# Patient Record
Sex: Female | Born: 1965
Health system: Southern US, Community
[De-identification: ages and names within clinical notes are randomized; demographics above are authoritative.]

## PROBLEM LIST (undated history)

## (undated) DIAGNOSIS — R0600 Dyspnea, unspecified: Secondary | ICD-10-CM

## (undated) DIAGNOSIS — Z720 Tobacco use: Secondary | ICD-10-CM

## (undated) DIAGNOSIS — I493 Ventricular premature depolarization: Secondary | ICD-10-CM

## (undated) DIAGNOSIS — J449 Chronic obstructive pulmonary disease, unspecified: Secondary | ICD-10-CM

## (undated) DIAGNOSIS — I2510 Atherosclerotic heart disease of native coronary artery without angina pectoris: Secondary | ICD-10-CM

## (undated) DIAGNOSIS — K219 Gastro-esophageal reflux disease without esophagitis: Secondary | ICD-10-CM

## (undated) DIAGNOSIS — Z972 Presence of dental prosthetic device (complete) (partial): Secondary | ICD-10-CM

## (undated) DIAGNOSIS — Z8719 Personal history of other diseases of the digestive system: Secondary | ICD-10-CM

## (undated) DIAGNOSIS — I10 Essential (primary) hypertension: Secondary | ICD-10-CM

## (undated) DIAGNOSIS — R002 Palpitations: Secondary | ICD-10-CM

## (undated) DIAGNOSIS — Z9889 Other specified postprocedural states: Secondary | ICD-10-CM

## (undated) DIAGNOSIS — U071 COVID-19: Secondary | ICD-10-CM

## (undated) DIAGNOSIS — F419 Anxiety disorder, unspecified: Secondary | ICD-10-CM

## (undated) DIAGNOSIS — M1990 Unspecified osteoarthritis, unspecified site: Secondary | ICD-10-CM

## (undated) DIAGNOSIS — R112 Nausea with vomiting, unspecified: Secondary | ICD-10-CM

## (undated) HISTORY — DX: Tobacco use: Z72.0

## (undated) HISTORY — DX: Palpitations: R00.2

## (undated) HISTORY — DX: Ventricular premature depolarization: I49.3

## (undated) HISTORY — DX: Anxiety disorder, unspecified: F41.9

## (undated) HISTORY — PX: EYE SURGERY: SHX253

## (undated) HISTORY — DX: Essential (primary) hypertension: I10

## (undated) HISTORY — DX: Atherosclerotic heart disease of native coronary artery without angina pectoris: I25.10

## (undated) HISTORY — DX: Chronic obstructive pulmonary disease, unspecified: J44.9

## (undated) HISTORY — PX: CHOLECYSTECTOMY: SHX55

---

## 1993-03-04 HISTORY — PX: VAGINAL HYSTERECTOMY: SUR661

## 1998-03-04 DIAGNOSIS — I10 Essential (primary) hypertension: Secondary | ICD-10-CM | POA: Insufficient documentation

## 2005-04-24 ENCOUNTER — Emergency Department: Payer: Self-pay | Admitting: Unknown Physician Specialty

## 2011-01-29 DIAGNOSIS — I493 Ventricular premature depolarization: Secondary | ICD-10-CM | POA: Insufficient documentation

## 2011-08-06 DIAGNOSIS — R12 Heartburn: Secondary | ICD-10-CM | POA: Insufficient documentation

## 2011-09-13 DIAGNOSIS — Z86010 Personal history of colonic polyps: Secondary | ICD-10-CM | POA: Insufficient documentation

## 2011-09-13 DIAGNOSIS — Z860101 Personal history of adenomatous and serrated colon polyps: Secondary | ICD-10-CM | POA: Insufficient documentation

## 2011-10-14 LAB — HM COLONOSCOPY

## 2011-12-05 ENCOUNTER — Emergency Department: Payer: Self-pay | Admitting: Emergency Medicine

## 2011-12-05 DIAGNOSIS — R6889 Other general symptoms and signs: Secondary | ICD-10-CM | POA: Diagnosis not present

## 2011-12-05 LAB — COMPREHENSIVE METABOLIC PANEL
Albumin: 3.5 g/dL (ref 3.4–5.0)
Alkaline Phosphatase: 114 U/L (ref 50–136)
Anion Gap: 8 (ref 7–16)
Bilirubin,Total: 0.5 mg/dL (ref 0.2–1.0)
Co2: 24 mmol/L (ref 21–32)
Creatinine: 0.76 mg/dL (ref 0.60–1.30)
EGFR (Non-African Amer.): >60
Glucose: 110 mg/dL — ABNORMAL HIGH (ref 65–99)
Osmolality: 279 (ref 275–301)
Potassium: 4 mmol/L (ref 3.5–5.1)
Sodium: 139 mmol/L (ref 136–145)

## 2011-12-05 LAB — CBC
HGB: 14.5 g/dL (ref 12.0–16.0)
MCH: 30.1 pg (ref 26.0–34.0)
MCHC: 35.1 g/dL (ref 32.0–36.0)
MCV: 86 fL (ref 80–100)
Platelet: 202 10*3/uL (ref 150–440)
RBC: 4.81 10*6/uL (ref 3.80–5.20)

## 2012-01-13 DIAGNOSIS — F332 Major depressive disorder, recurrent severe without psychotic features: Secondary | ICD-10-CM | POA: Diagnosis not present

## 2012-01-13 DIAGNOSIS — F4001 Agoraphobia with panic disorder: Secondary | ICD-10-CM | POA: Diagnosis not present

## 2012-01-14 DIAGNOSIS — L408 Other psoriasis: Secondary | ICD-10-CM | POA: Diagnosis not present

## 2012-01-23 DIAGNOSIS — F4001 Agoraphobia with panic disorder: Secondary | ICD-10-CM | POA: Diagnosis not present

## 2012-01-23 DIAGNOSIS — F332 Major depressive disorder, recurrent severe without psychotic features: Secondary | ICD-10-CM | POA: Diagnosis not present

## 2012-02-11 DIAGNOSIS — IMO0002 Reserved for concepts with insufficient information to code with codable children: Secondary | ICD-10-CM | POA: Insufficient documentation

## 2012-02-11 DIAGNOSIS — M329 Systemic lupus erythematosus, unspecified: Secondary | ICD-10-CM | POA: Insufficient documentation

## 2012-02-11 DIAGNOSIS — F410 Panic disorder [episodic paroxysmal anxiety] without agoraphobia: Secondary | ICD-10-CM

## 2012-02-11 DIAGNOSIS — M797 Fibromyalgia: Secondary | ICD-10-CM | POA: Insufficient documentation

## 2012-02-11 DIAGNOSIS — H119 Unspecified disorder of conjunctiva: Secondary | ICD-10-CM | POA: Diagnosis not present

## 2012-02-11 HISTORY — DX: Panic disorder (episodic paroxysmal anxiety): F41.0

## 2012-02-21 DIAGNOSIS — H11139 Conjunctival pigmentations, unspecified eye: Secondary | ICD-10-CM | POA: Diagnosis not present

## 2012-02-21 DIAGNOSIS — H1189 Other specified disorders of conjunctiva: Secondary | ICD-10-CM | POA: Diagnosis not present

## 2012-02-21 DIAGNOSIS — L409 Psoriasis, unspecified: Secondary | ICD-10-CM | POA: Insufficient documentation

## 2012-02-21 DIAGNOSIS — C6930 Malignant neoplasm of unspecified choroid: Secondary | ICD-10-CM | POA: Diagnosis not present

## 2012-02-21 DIAGNOSIS — H169 Unspecified keratitis: Secondary | ICD-10-CM | POA: Diagnosis not present

## 2012-03-09 DIAGNOSIS — M2550 Pain in unspecified joint: Secondary | ICD-10-CM | POA: Insufficient documentation

## 2012-03-09 DIAGNOSIS — M25549 Pain in joints of unspecified hand: Secondary | ICD-10-CM | POA: Diagnosis not present

## 2012-03-09 DIAGNOSIS — M7750 Other enthesopathy of unspecified foot: Secondary | ICD-10-CM | POA: Diagnosis not present

## 2012-03-09 DIAGNOSIS — M899 Disorder of bone, unspecified: Secondary | ICD-10-CM | POA: Diagnosis not present

## 2012-03-09 DIAGNOSIS — M25579 Pain in unspecified ankle and joints of unspecified foot: Secondary | ICD-10-CM | POA: Diagnosis not present

## 2012-03-10 DIAGNOSIS — R109 Unspecified abdominal pain: Secondary | ICD-10-CM | POA: Diagnosis not present

## 2012-03-10 DIAGNOSIS — K219 Gastro-esophageal reflux disease without esophagitis: Secondary | ICD-10-CM | POA: Diagnosis not present

## 2012-03-10 DIAGNOSIS — R6889 Other general symptoms and signs: Secondary | ICD-10-CM | POA: Diagnosis not present

## 2012-03-10 DIAGNOSIS — R1012 Left upper quadrant pain: Secondary | ICD-10-CM | POA: Diagnosis not present

## 2012-03-10 DIAGNOSIS — K319 Disease of stomach and duodenum, unspecified: Secondary | ICD-10-CM | POA: Diagnosis not present

## 2012-03-11 DIAGNOSIS — R1084 Generalized abdominal pain: Secondary | ICD-10-CM | POA: Insufficient documentation

## 2012-03-12 DIAGNOSIS — F332 Major depressive disorder, recurrent severe without psychotic features: Secondary | ICD-10-CM | POA: Diagnosis not present

## 2012-03-12 DIAGNOSIS — F4001 Agoraphobia with panic disorder: Secondary | ICD-10-CM | POA: Diagnosis not present

## 2012-03-26 DIAGNOSIS — L408 Other psoriasis: Secondary | ICD-10-CM | POA: Diagnosis not present

## 2012-03-26 DIAGNOSIS — L930 Discoid lupus erythematosus: Secondary | ICD-10-CM | POA: Diagnosis not present

## 2012-03-30 DIAGNOSIS — M329 Systemic lupus erythematosus, unspecified: Secondary | ICD-10-CM | POA: Diagnosis not present

## 2012-03-30 DIAGNOSIS — R21 Rash and other nonspecific skin eruption: Secondary | ICD-10-CM | POA: Diagnosis not present

## 2012-03-30 DIAGNOSIS — R109 Unspecified abdominal pain: Secondary | ICD-10-CM | POA: Diagnosis not present

## 2012-04-14 DIAGNOSIS — Z1231 Encounter for screening mammogram for malignant neoplasm of breast: Secondary | ICD-10-CM | POA: Diagnosis not present

## 2012-04-14 LAB — HM MAMMOGRAPHY

## 2012-04-21 DIAGNOSIS — F4001 Agoraphobia with panic disorder: Secondary | ICD-10-CM | POA: Diagnosis not present

## 2012-04-21 DIAGNOSIS — F332 Major depressive disorder, recurrent severe without psychotic features: Secondary | ICD-10-CM | POA: Diagnosis not present

## 2012-04-30 DIAGNOSIS — Z79899 Other long term (current) drug therapy: Secondary | ICD-10-CM | POA: Diagnosis not present

## 2012-04-30 DIAGNOSIS — L408 Other psoriasis: Secondary | ICD-10-CM | POA: Diagnosis not present

## 2012-05-07 DIAGNOSIS — Z79899 Other long term (current) drug therapy: Secondary | ICD-10-CM | POA: Diagnosis not present

## 2012-05-07 DIAGNOSIS — L408 Other psoriasis: Secondary | ICD-10-CM | POA: Diagnosis not present

## 2012-05-12 DIAGNOSIS — F4001 Agoraphobia with panic disorder: Secondary | ICD-10-CM | POA: Diagnosis not present

## 2012-05-12 DIAGNOSIS — F332 Major depressive disorder, recurrent severe without psychotic features: Secondary | ICD-10-CM | POA: Diagnosis not present

## 2012-05-28 DIAGNOSIS — L708 Other acne: Secondary | ICD-10-CM | POA: Diagnosis not present

## 2012-05-28 DIAGNOSIS — L723 Sebaceous cyst: Secondary | ICD-10-CM | POA: Diagnosis not present

## 2012-05-28 DIAGNOSIS — L408 Other psoriasis: Secondary | ICD-10-CM | POA: Diagnosis not present

## 2012-05-30 DIAGNOSIS — L0201 Cutaneous abscess of face: Secondary | ICD-10-CM | POA: Insufficient documentation

## 2012-05-30 DIAGNOSIS — L930 Discoid lupus erythematosus: Secondary | ICD-10-CM | POA: Insufficient documentation

## 2012-06-01 DIAGNOSIS — B029 Zoster without complications: Secondary | ICD-10-CM | POA: Diagnosis not present

## 2012-06-01 DIAGNOSIS — R209 Unspecified disturbances of skin sensation: Secondary | ICD-10-CM | POA: Diagnosis not present

## 2012-06-10 DIAGNOSIS — M2550 Pain in unspecified joint: Secondary | ICD-10-CM | POA: Diagnosis not present

## 2012-06-10 DIAGNOSIS — R1084 Generalized abdominal pain: Secondary | ICD-10-CM | POA: Diagnosis not present

## 2012-06-10 DIAGNOSIS — M329 Systemic lupus erythematosus, unspecified: Secondary | ICD-10-CM | POA: Diagnosis not present

## 2012-06-10 DIAGNOSIS — I10 Essential (primary) hypertension: Secondary | ICD-10-CM | POA: Diagnosis not present

## 2012-06-15 DIAGNOSIS — F331 Major depressive disorder, recurrent, moderate: Secondary | ICD-10-CM | POA: Diagnosis not present

## 2012-06-15 DIAGNOSIS — F4001 Agoraphobia with panic disorder: Secondary | ICD-10-CM | POA: Diagnosis not present

## 2012-07-13 DIAGNOSIS — F4001 Agoraphobia with panic disorder: Secondary | ICD-10-CM | POA: Diagnosis not present

## 2012-07-13 DIAGNOSIS — F331 Major depressive disorder, recurrent, moderate: Secondary | ICD-10-CM | POA: Diagnosis not present

## 2012-07-22 DIAGNOSIS — J019 Acute sinusitis, unspecified: Secondary | ICD-10-CM | POA: Diagnosis not present

## 2012-07-22 DIAGNOSIS — H65119 Acute and subacute allergic otitis media (mucoid) (sanguinous) (serous), unspecified ear: Secondary | ICD-10-CM | POA: Diagnosis not present

## 2012-07-23 DIAGNOSIS — F411 Generalized anxiety disorder: Secondary | ICD-10-CM | POA: Diagnosis not present

## 2012-07-23 DIAGNOSIS — F17200 Nicotine dependence, unspecified, uncomplicated: Secondary | ICD-10-CM | POA: Diagnosis not present

## 2012-07-23 DIAGNOSIS — R0602 Shortness of breath: Secondary | ICD-10-CM | POA: Diagnosis not present

## 2012-07-23 DIAGNOSIS — B338 Other specified viral diseases: Secondary | ICD-10-CM | POA: Diagnosis not present

## 2012-07-23 DIAGNOSIS — R0902 Hypoxemia: Secondary | ICD-10-CM | POA: Diagnosis not present

## 2012-07-23 DIAGNOSIS — J209 Acute bronchitis, unspecified: Secondary | ICD-10-CM | POA: Diagnosis not present

## 2012-07-23 DIAGNOSIS — J449 Chronic obstructive pulmonary disease, unspecified: Secondary | ICD-10-CM | POA: Diagnosis not present

## 2012-07-23 DIAGNOSIS — I509 Heart failure, unspecified: Secondary | ICD-10-CM | POA: Diagnosis not present

## 2012-07-23 DIAGNOSIS — M329 Systemic lupus erythematosus, unspecified: Secondary | ICD-10-CM | POA: Diagnosis not present

## 2012-07-23 DIAGNOSIS — J441 Chronic obstructive pulmonary disease with (acute) exacerbation: Secondary | ICD-10-CM | POA: Diagnosis not present

## 2012-07-23 DIAGNOSIS — R9431 Abnormal electrocardiogram [ECG] [EKG]: Secondary | ICD-10-CM | POA: Diagnosis not present

## 2012-07-23 LAB — CK TOTAL AND CKMB (NOT AT ARMC): CK, Total: 44 U/L (ref 21–215)

## 2012-07-23 LAB — BASIC METABOLIC PANEL
Anion Gap: 4 — ABNORMAL LOW (ref 7–16)
BUN: 12 mg/dL (ref 7–18)
Calcium, Total: 9.5 mg/dL (ref 8.5–10.1)
Co2: 31 mmol/L (ref 21–32)
EGFR (Non-African Amer.): >60
Glucose: 89 mg/dL (ref 65–99)

## 2012-07-23 LAB — CBC WITH DIFFERENTIAL/PLATELET
Eosinophil #: 0.3 10*3/uL (ref 0.0–0.7)
Eosinophil %: 2.2 %
HCT: 44.4 % (ref 35.0–47.0)
HGB: 15 g/dL (ref 12.0–16.0)
Lymphocyte #: 3.6 10*3/uL (ref 1.0–3.6)
Lymphocyte %: 29.7 %
MCHC: 33.7 g/dL (ref 32.0–36.0)
MCV: 85 fL (ref 80–100)
Monocyte #: 0.9 x10 3/mm (ref 0.2–0.9)
Monocyte %: 7.5 %
Neutrophil #: 7.2 10*3/uL — ABNORMAL HIGH (ref 1.4–6.5)
RDW: 14.1 % (ref 11.5–14.5)

## 2012-07-23 LAB — URINALYSIS, COMPLETE
Bilirubin,UR: NEGATIVE
Blood: NEGATIVE
Glucose,UR: NEGATIVE mg/dL (ref 0–75)
Leukocyte Esterase: NEGATIVE
Nitrite: NEGATIVE
Ph: 6 (ref 4.5–8.0)
WBC UR: 1 /HPF (ref 0–5)

## 2012-07-23 LAB — TROPONIN I: Troponin-I: <0.02 ng/mL

## 2012-07-24 DIAGNOSIS — F411 Generalized anxiety disorder: Secondary | ICD-10-CM | POA: Diagnosis not present

## 2012-07-24 DIAGNOSIS — J209 Acute bronchitis, unspecified: Secondary | ICD-10-CM | POA: Diagnosis not present

## 2012-07-24 DIAGNOSIS — J441 Chronic obstructive pulmonary disease with (acute) exacerbation: Secondary | ICD-10-CM | POA: Diagnosis not present

## 2012-07-24 DIAGNOSIS — K219 Gastro-esophageal reflux disease without esophagitis: Secondary | ICD-10-CM | POA: Diagnosis not present

## 2012-07-24 DIAGNOSIS — M329 Systemic lupus erythematosus, unspecified: Secondary | ICD-10-CM | POA: Diagnosis not present

## 2012-07-24 DIAGNOSIS — R0602 Shortness of breath: Secondary | ICD-10-CM | POA: Diagnosis not present

## 2012-07-24 LAB — TROPONIN I
Troponin-I: <0.02 ng/mL
Troponin-I: <0.02 ng/mL

## 2012-07-24 LAB — CK-MB: CK-MB: 1.2 ng/mL (ref 0.5–3.6)

## 2012-07-25 ENCOUNTER — Inpatient Hospital Stay: Payer: Self-pay | Admitting: Internal Medicine

## 2012-07-25 DIAGNOSIS — F410 Panic disorder [episodic paroxysmal anxiety] without agoraphobia: Secondary | ICD-10-CM | POA: Diagnosis not present

## 2012-07-25 DIAGNOSIS — IMO0001 Reserved for inherently not codable concepts without codable children: Secondary | ICD-10-CM | POA: Diagnosis present

## 2012-07-25 DIAGNOSIS — F17200 Nicotine dependence, unspecified, uncomplicated: Secondary | ICD-10-CM | POA: Diagnosis present

## 2012-07-25 DIAGNOSIS — Z6828 Body mass index (BMI) 28.0-28.9, adult: Secondary | ICD-10-CM | POA: Diagnosis not present

## 2012-07-25 DIAGNOSIS — R0602 Shortness of breath: Secondary | ICD-10-CM | POA: Diagnosis not present

## 2012-07-25 DIAGNOSIS — K219 Gastro-esophageal reflux disease without esophagitis: Secondary | ICD-10-CM | POA: Diagnosis present

## 2012-07-25 DIAGNOSIS — E669 Obesity, unspecified: Secondary | ICD-10-CM | POA: Diagnosis present

## 2012-07-25 DIAGNOSIS — J440 Chronic obstructive pulmonary disease with acute lower respiratory infection: Secondary | ICD-10-CM | POA: Diagnosis present

## 2012-07-25 DIAGNOSIS — R9431 Abnormal electrocardiogram [ECG] [EKG]: Secondary | ICD-10-CM | POA: Diagnosis present

## 2012-07-25 DIAGNOSIS — J209 Acute bronchitis, unspecified: Secondary | ICD-10-CM | POA: Diagnosis not present

## 2012-07-25 DIAGNOSIS — M329 Systemic lupus erythematosus, unspecified: Secondary | ICD-10-CM | POA: Diagnosis present

## 2012-07-25 DIAGNOSIS — J441 Chronic obstructive pulmonary disease with (acute) exacerbation: Secondary | ICD-10-CM | POA: Diagnosis not present

## 2012-07-25 DIAGNOSIS — F411 Generalized anxiety disorder: Secondary | ICD-10-CM | POA: Diagnosis not present

## 2012-07-25 LAB — URINE CULTURE

## 2012-07-29 LAB — CULTURE, BLOOD (SINGLE)

## 2012-07-30 DIAGNOSIS — R0602 Shortness of breath: Secondary | ICD-10-CM | POA: Diagnosis not present

## 2012-08-06 DIAGNOSIS — R0602 Shortness of breath: Secondary | ICD-10-CM | POA: Diagnosis not present

## 2012-08-10 DIAGNOSIS — Z23 Encounter for immunization: Secondary | ICD-10-CM | POA: Diagnosis not present

## 2012-08-10 DIAGNOSIS — R0602 Shortness of breath: Secondary | ICD-10-CM | POA: Diagnosis not present

## 2012-08-10 DIAGNOSIS — J449 Chronic obstructive pulmonary disease, unspecified: Secondary | ICD-10-CM | POA: Diagnosis not present

## 2012-08-10 DIAGNOSIS — M329 Systemic lupus erythematosus, unspecified: Secondary | ICD-10-CM | POA: Diagnosis not present

## 2012-08-12 DIAGNOSIS — M545 Low back pain: Secondary | ICD-10-CM | POA: Diagnosis not present

## 2012-08-12 DIAGNOSIS — H65119 Acute and subacute allergic otitis media (mucoid) (sanguinous) (serous), unspecified ear: Secondary | ICD-10-CM | POA: Diagnosis not present

## 2012-08-13 DIAGNOSIS — R350 Frequency of micturition: Secondary | ICD-10-CM | POA: Diagnosis not present

## 2012-08-14 ENCOUNTER — Emergency Department: Payer: Self-pay

## 2012-08-14 DIAGNOSIS — Z888 Allergy status to other drugs, medicaments and biological substances status: Secondary | ICD-10-CM | POA: Diagnosis not present

## 2012-08-14 DIAGNOSIS — I10 Essential (primary) hypertension: Secondary | ICD-10-CM | POA: Diagnosis not present

## 2012-08-14 DIAGNOSIS — S336XXA Sprain of sacroiliac joint, initial encounter: Secondary | ICD-10-CM | POA: Diagnosis not present

## 2012-08-14 DIAGNOSIS — Z87442 Personal history of urinary calculi: Secondary | ICD-10-CM | POA: Diagnosis not present

## 2012-08-14 DIAGNOSIS — IMO0001 Reserved for inherently not codable concepts without codable children: Secondary | ICD-10-CM | POA: Diagnosis not present

## 2012-08-14 DIAGNOSIS — Z9079 Acquired absence of other genital organ(s): Secondary | ICD-10-CM | POA: Diagnosis not present

## 2012-08-14 DIAGNOSIS — F17200 Nicotine dependence, unspecified, uncomplicated: Secondary | ICD-10-CM | POA: Diagnosis not present

## 2012-08-14 LAB — URINALYSIS, COMPLETE
Bacteria: NONE SEEN
Glucose,UR: NEGATIVE mg/dL (ref 0–75)
Ketone: NEGATIVE
Leukocyte Esterase: NEGATIVE
Nitrite: NEGATIVE
Ph: 5 (ref 4.5–8.0)
Protein: NEGATIVE
RBC,UR: NONE SEEN /HPF (ref 0–5)
Specific Gravity: 1.011 (ref 1.003–1.030)
Squamous Epithelial: <1
WBC UR: 1 /HPF (ref 0–5)

## 2012-08-14 LAB — CBC
HCT: 41.7 % (ref 35.0–47.0)
HGB: 14.5 g/dL (ref 12.0–16.0)
MCHC: 34.7 g/dL (ref 32.0–36.0)
Platelet: 214 10*3/uL (ref 150–440)
RBC: 4.85 10*6/uL (ref 3.80–5.20)
RDW: 14.4 % (ref 11.5–14.5)

## 2012-08-14 LAB — COMPREHENSIVE METABOLIC PANEL
Alkaline Phosphatase: 89 U/L (ref 50–136)
Anion Gap: 6 — ABNORMAL LOW (ref 7–16)
Bilirubin,Total: 0.6 mg/dL (ref 0.2–1.0)
Calcium, Total: 9.4 mg/dL (ref 8.5–10.1)
Chloride: 104 mmol/L (ref 98–107)
EGFR (African American): >60
Glucose: 81 mg/dL (ref 65–99)
Osmolality: 278 (ref 275–301)
SGOT(AST): 15 U/L (ref 15–37)
Sodium: 140 mmol/L (ref 136–145)
Total Protein: 7.1 g/dL (ref 6.4–8.2)

## 2012-08-28 DIAGNOSIS — M549 Dorsalgia, unspecified: Secondary | ICD-10-CM | POA: Diagnosis not present

## 2012-08-28 DIAGNOSIS — J449 Chronic obstructive pulmonary disease, unspecified: Secondary | ICD-10-CM | POA: Diagnosis not present

## 2012-08-28 DIAGNOSIS — R1084 Generalized abdominal pain: Secondary | ICD-10-CM | POA: Diagnosis not present

## 2012-08-28 DIAGNOSIS — M999 Biomechanical lesion, unspecified: Secondary | ICD-10-CM | POA: Diagnosis not present

## 2012-09-02 DIAGNOSIS — M999 Biomechanical lesion, unspecified: Secondary | ICD-10-CM | POA: Diagnosis not present

## 2012-09-07 DIAGNOSIS — F4001 Agoraphobia with panic disorder: Secondary | ICD-10-CM | POA: Diagnosis not present

## 2012-09-07 DIAGNOSIS — F332 Major depressive disorder, recurrent severe without psychotic features: Secondary | ICD-10-CM | POA: Diagnosis not present

## 2012-09-16 DIAGNOSIS — M999 Biomechanical lesion, unspecified: Secondary | ICD-10-CM | POA: Diagnosis not present

## 2012-09-18 DIAGNOSIS — M999 Biomechanical lesion, unspecified: Secondary | ICD-10-CM | POA: Diagnosis not present

## 2012-09-23 DIAGNOSIS — M999 Biomechanical lesion, unspecified: Secondary | ICD-10-CM | POA: Diagnosis not present

## 2012-09-25 DIAGNOSIS — M999 Biomechanical lesion, unspecified: Secondary | ICD-10-CM | POA: Diagnosis not present

## 2012-10-13 DIAGNOSIS — M549 Dorsalgia, unspecified: Secondary | ICD-10-CM | POA: Diagnosis not present

## 2012-10-13 DIAGNOSIS — I10 Essential (primary) hypertension: Secondary | ICD-10-CM | POA: Diagnosis not present

## 2012-10-13 DIAGNOSIS — J449 Chronic obstructive pulmonary disease, unspecified: Secondary | ICD-10-CM | POA: Diagnosis not present

## 2012-10-22 ENCOUNTER — Ambulatory Visit: Payer: Self-pay | Admitting: Family Medicine

## 2012-10-22 DIAGNOSIS — R509 Fever, unspecified: Secondary | ICD-10-CM | POA: Diagnosis not present

## 2012-10-22 DIAGNOSIS — R059 Cough, unspecified: Secondary | ICD-10-CM | POA: Diagnosis not present

## 2012-10-22 DIAGNOSIS — R05 Cough: Secondary | ICD-10-CM | POA: Diagnosis not present

## 2012-10-29 ENCOUNTER — Emergency Department: Payer: Self-pay | Admitting: Emergency Medicine

## 2012-10-29 DIAGNOSIS — J441 Chronic obstructive pulmonary disease with (acute) exacerbation: Secondary | ICD-10-CM | POA: Diagnosis not present

## 2012-10-29 DIAGNOSIS — J440 Chronic obstructive pulmonary disease with acute lower respiratory infection: Secondary | ICD-10-CM | POA: Diagnosis not present

## 2012-10-29 DIAGNOSIS — Z888 Allergy status to other drugs, medicaments and biological substances status: Secondary | ICD-10-CM | POA: Diagnosis not present

## 2012-10-29 DIAGNOSIS — Z79899 Other long term (current) drug therapy: Secondary | ICD-10-CM | POA: Diagnosis not present

## 2012-10-29 DIAGNOSIS — R0602 Shortness of breath: Secondary | ICD-10-CM | POA: Diagnosis not present

## 2012-10-29 DIAGNOSIS — R062 Wheezing: Secondary | ICD-10-CM | POA: Diagnosis not present

## 2012-10-29 DIAGNOSIS — J209 Acute bronchitis, unspecified: Secondary | ICD-10-CM | POA: Diagnosis not present

## 2012-10-29 LAB — BASIC METABOLIC PANEL
Anion Gap: 3 — ABNORMAL LOW (ref 7–16)
BUN: 12 mg/dL (ref 7–18)
Calcium, Total: 9.4 mg/dL (ref 8.5–10.1)
Chloride: 106 mmol/L (ref 98–107)
Co2: 31 mmol/L (ref 21–32)
Creatinine: 0.82 mg/dL (ref 0.60–1.30)
EGFR (African American): >60
Glucose: 84 mg/dL (ref 65–99)
Sodium: 140 mmol/L (ref 136–145)

## 2012-10-29 LAB — CBC
HCT: 42.2 % (ref 35.0–47.0)
MCH: 29 pg (ref 26.0–34.0)
MCHC: 34.3 g/dL (ref 32.0–36.0)
RDW: 13.9 % (ref 11.5–14.5)

## 2012-10-29 LAB — TROPONIN I: Troponin-I: <0.02 ng/mL

## 2012-10-30 DIAGNOSIS — F17200 Nicotine dependence, unspecified, uncomplicated: Secondary | ICD-10-CM | POA: Diagnosis not present

## 2012-10-30 DIAGNOSIS — J441 Chronic obstructive pulmonary disease with (acute) exacerbation: Secondary | ICD-10-CM | POA: Diagnosis not present

## 2012-11-09 DIAGNOSIS — F4001 Agoraphobia with panic disorder: Secondary | ICD-10-CM | POA: Diagnosis not present

## 2012-11-09 DIAGNOSIS — F331 Major depressive disorder, recurrent, moderate: Secondary | ICD-10-CM | POA: Diagnosis not present

## 2012-11-09 DIAGNOSIS — F332 Major depressive disorder, recurrent severe without psychotic features: Secondary | ICD-10-CM | POA: Diagnosis not present

## 2012-11-11 DIAGNOSIS — M999 Biomechanical lesion, unspecified: Secondary | ICD-10-CM | POA: Diagnosis not present

## 2012-11-18 DIAGNOSIS — M999 Biomechanical lesion, unspecified: Secondary | ICD-10-CM | POA: Diagnosis not present

## 2012-11-20 DIAGNOSIS — J449 Chronic obstructive pulmonary disease, unspecified: Secondary | ICD-10-CM | POA: Diagnosis not present

## 2012-11-20 DIAGNOSIS — J441 Chronic obstructive pulmonary disease with (acute) exacerbation: Secondary | ICD-10-CM | POA: Diagnosis not present

## 2013-01-14 DIAGNOSIS — J449 Chronic obstructive pulmonary disease, unspecified: Secondary | ICD-10-CM | POA: Diagnosis not present

## 2013-01-14 DIAGNOSIS — J441 Chronic obstructive pulmonary disease with (acute) exacerbation: Secondary | ICD-10-CM | POA: Diagnosis not present

## 2013-02-01 DIAGNOSIS — F4001 Agoraphobia with panic disorder: Secondary | ICD-10-CM | POA: Diagnosis not present

## 2013-02-01 DIAGNOSIS — F331 Major depressive disorder, recurrent, moderate: Secondary | ICD-10-CM | POA: Diagnosis not present

## 2013-03-10 ENCOUNTER — Emergency Department: Payer: Self-pay | Admitting: Emergency Medicine

## 2013-03-10 DIAGNOSIS — I498 Other specified cardiac arrhythmias: Secondary | ICD-10-CM | POA: Diagnosis not present

## 2013-03-10 DIAGNOSIS — Z79899 Other long term (current) drug therapy: Secondary | ICD-10-CM | POA: Diagnosis not present

## 2013-03-10 DIAGNOSIS — I10 Essential (primary) hypertension: Secondary | ICD-10-CM | POA: Diagnosis not present

## 2013-03-10 DIAGNOSIS — Z881 Allergy status to other antibiotic agents status: Secondary | ICD-10-CM | POA: Diagnosis not present

## 2013-03-10 DIAGNOSIS — K219 Gastro-esophageal reflux disease without esophagitis: Secondary | ICD-10-CM | POA: Diagnosis not present

## 2013-03-10 DIAGNOSIS — F17200 Nicotine dependence, unspecified, uncomplicated: Secondary | ICD-10-CM | POA: Diagnosis not present

## 2013-03-10 DIAGNOSIS — M329 Systemic lupus erythematosus, unspecified: Secondary | ICD-10-CM | POA: Diagnosis not present

## 2013-03-10 DIAGNOSIS — J209 Acute bronchitis, unspecified: Secondary | ICD-10-CM | POA: Diagnosis not present

## 2013-03-10 DIAGNOSIS — Z90710 Acquired absence of both cervix and uterus: Secondary | ICD-10-CM | POA: Diagnosis not present

## 2013-03-10 DIAGNOSIS — J988 Other specified respiratory disorders: Secondary | ICD-10-CM | POA: Diagnosis not present

## 2013-03-10 DIAGNOSIS — L408 Other psoriasis: Secondary | ICD-10-CM | POA: Diagnosis not present

## 2013-03-10 DIAGNOSIS — J441 Chronic obstructive pulmonary disease with (acute) exacerbation: Secondary | ICD-10-CM | POA: Diagnosis not present

## 2013-03-10 DIAGNOSIS — R0602 Shortness of breath: Secondary | ICD-10-CM | POA: Diagnosis not present

## 2013-03-10 DIAGNOSIS — R079 Chest pain, unspecified: Secondary | ICD-10-CM | POA: Diagnosis not present

## 2013-03-10 LAB — CK TOTAL AND CKMB (NOT AT ARMC)
CK, Total: 40 U/L (ref 21–215)
CK-MB: <0.5 ng/mL — ABNORMAL LOW (ref 0.5–3.6)

## 2013-03-10 LAB — CBC WITH DIFFERENTIAL/PLATELET
BASOS PCT: 0.3 %
Basophil #: 0 10*3/uL (ref 0.0–0.1)
Eosinophil #: 0 10*3/uL (ref 0.0–0.7)
Eosinophil %: 0.2 %
HCT: 44.2 % (ref 35.0–47.0)
HGB: 15 g/dL (ref 12.0–16.0)
LYMPHS ABS: 0.8 10*3/uL — AB (ref 1.0–3.6)
Lymphocyte %: 6.4 %
MCH: 29 pg (ref 26.0–34.0)
MCHC: 33.9 g/dL (ref 32.0–36.0)
MCV: 86 fL (ref 80–100)
MONOS PCT: 4 %
Monocyte #: 0.5 x10 3/mm (ref 0.2–0.9)
NEUTROS ABS: 10.4 10*3/uL — AB (ref 1.4–6.5)
Neutrophil %: 89.1 %
Platelet: 212 10*3/uL (ref 150–440)
RBC: 5.17 10*6/uL (ref 3.80–5.20)
RDW: 14.2 % (ref 11.5–14.5)
WBC: 11.7 10*3/uL — ABNORMAL HIGH (ref 3.6–11.0)

## 2013-03-10 LAB — COMPREHENSIVE METABOLIC PANEL
ALBUMIN: 3.6 g/dL (ref 3.4–5.0)
Alkaline Phosphatase: 114 U/L
Anion Gap: 5 — ABNORMAL LOW (ref 7–16)
BILIRUBIN TOTAL: 0.5 mg/dL (ref 0.2–1.0)
BUN: 12 mg/dL (ref 7–18)
CALCIUM: 9.6 mg/dL (ref 8.5–10.1)
CHLORIDE: 101 mmol/L (ref 98–107)
CREATININE: 0.75 mg/dL (ref 0.60–1.30)
Co2: 26 mmol/L (ref 21–32)
Glucose: 88 mg/dL (ref 65–99)
Osmolality: 264 (ref 275–301)
Potassium: 3.9 mmol/L (ref 3.5–5.1)
SGOT(AST): 23 U/L (ref 15–37)
SGPT (ALT): 26 U/L (ref 12–78)
SODIUM: 132 mmol/L — AB (ref 136–145)
Total Protein: 7.7 g/dL (ref 6.4–8.2)

## 2013-03-10 LAB — LIPASE, BLOOD: Lipase: 166 U/L (ref 73–393)

## 2013-03-10 LAB — TROPONIN I: Troponin-I: <0.02 ng/mL

## 2013-03-11 DIAGNOSIS — J111 Influenza due to unidentified influenza virus with other respiratory manifestations: Secondary | ICD-10-CM | POA: Diagnosis not present

## 2013-03-11 DIAGNOSIS — J209 Acute bronchitis, unspecified: Secondary | ICD-10-CM | POA: Diagnosis not present

## 2013-03-11 DIAGNOSIS — R079 Chest pain, unspecified: Secondary | ICD-10-CM | POA: Diagnosis not present

## 2013-03-11 DIAGNOSIS — R002 Palpitations: Secondary | ICD-10-CM | POA: Diagnosis not present

## 2013-03-11 DIAGNOSIS — M329 Systemic lupus erythematosus, unspecified: Secondary | ICD-10-CM | POA: Diagnosis not present

## 2013-03-11 DIAGNOSIS — Z5181 Encounter for therapeutic drug level monitoring: Secondary | ICD-10-CM | POA: Diagnosis not present

## 2013-03-11 DIAGNOSIS — R9431 Abnormal electrocardiogram [ECG] [EKG]: Secondary | ICD-10-CM | POA: Diagnosis not present

## 2013-03-11 DIAGNOSIS — R0602 Shortness of breath: Secondary | ICD-10-CM | POA: Diagnosis not present

## 2013-03-11 DIAGNOSIS — Z79899 Other long term (current) drug therapy: Secondary | ICD-10-CM | POA: Diagnosis not present

## 2013-03-11 DIAGNOSIS — J449 Chronic obstructive pulmonary disease, unspecified: Secondary | ICD-10-CM | POA: Diagnosis not present

## 2013-03-11 DIAGNOSIS — I10 Essential (primary) hypertension: Secondary | ICD-10-CM | POA: Diagnosis not present

## 2013-03-11 DIAGNOSIS — R21 Rash and other nonspecific skin eruption: Secondary | ICD-10-CM | POA: Diagnosis not present

## 2013-03-11 DIAGNOSIS — R0789 Other chest pain: Secondary | ICD-10-CM | POA: Diagnosis not present

## 2013-03-11 DIAGNOSIS — F17200 Nicotine dependence, unspecified, uncomplicated: Secondary | ICD-10-CM | POA: Diagnosis not present

## 2013-03-11 DIAGNOSIS — J441 Chronic obstructive pulmonary disease with (acute) exacerbation: Secondary | ICD-10-CM | POA: Diagnosis not present

## 2013-03-11 DIAGNOSIS — I498 Other specified cardiac arrhythmias: Secondary | ICD-10-CM | POA: Diagnosis not present

## 2013-03-12 DIAGNOSIS — R002 Palpitations: Secondary | ICD-10-CM | POA: Diagnosis not present

## 2013-03-12 DIAGNOSIS — J111 Influenza due to unidentified influenza virus with other respiratory manifestations: Secondary | ICD-10-CM | POA: Diagnosis not present

## 2013-03-12 DIAGNOSIS — R0789 Other chest pain: Secondary | ICD-10-CM | POA: Diagnosis not present

## 2013-03-12 DIAGNOSIS — R21 Rash and other nonspecific skin eruption: Secondary | ICD-10-CM | POA: Diagnosis not present

## 2013-03-12 DIAGNOSIS — R079 Chest pain, unspecified: Secondary | ICD-10-CM | POA: Diagnosis not present

## 2013-03-23 DIAGNOSIS — R002 Palpitations: Secondary | ICD-10-CM | POA: Diagnosis not present

## 2013-03-23 DIAGNOSIS — R091 Pleurisy: Secondary | ICD-10-CM | POA: Diagnosis not present

## 2013-03-23 DIAGNOSIS — J449 Chronic obstructive pulmonary disease, unspecified: Secondary | ICD-10-CM | POA: Diagnosis not present

## 2013-04-15 DIAGNOSIS — F4001 Agoraphobia with panic disorder: Secondary | ICD-10-CM | POA: Diagnosis not present

## 2013-04-16 DIAGNOSIS — J209 Acute bronchitis, unspecified: Secondary | ICD-10-CM | POA: Diagnosis not present

## 2013-04-16 DIAGNOSIS — J449 Chronic obstructive pulmonary disease, unspecified: Secondary | ICD-10-CM | POA: Diagnosis not present

## 2013-04-16 DIAGNOSIS — F411 Generalized anxiety disorder: Secondary | ICD-10-CM | POA: Diagnosis not present

## 2013-05-20 ENCOUNTER — Encounter: Payer: Self-pay | Admitting: Pulmonary Disease

## 2013-05-20 ENCOUNTER — Ambulatory Visit (INDEPENDENT_AMBULATORY_CARE_PROVIDER_SITE_OTHER): Payer: Medicare Other | Admitting: Pulmonary Disease

## 2013-05-20 ENCOUNTER — Encounter (INDEPENDENT_AMBULATORY_CARE_PROVIDER_SITE_OTHER): Payer: Self-pay

## 2013-05-20 VITALS — BP 130/76 | HR 79 | Temp 97.9°F | Ht 62.0 in | Wt 161.0 lb

## 2013-05-20 DIAGNOSIS — J449 Chronic obstructive pulmonary disease, unspecified: Secondary | ICD-10-CM | POA: Insufficient documentation

## 2013-05-20 DIAGNOSIS — R0602 Shortness of breath: Secondary | ICD-10-CM | POA: Insufficient documentation

## 2013-05-20 DIAGNOSIS — F17200 Nicotine dependence, unspecified, uncomplicated: Secondary | ICD-10-CM | POA: Diagnosis not present

## 2013-05-20 DIAGNOSIS — G4710 Hypersomnia, unspecified: Secondary | ICD-10-CM

## 2013-05-20 DIAGNOSIS — R400 Somnolence: Secondary | ICD-10-CM | POA: Insufficient documentation

## 2013-05-20 DIAGNOSIS — Z720 Tobacco use: Secondary | ICD-10-CM | POA: Insufficient documentation

## 2013-05-20 NOTE — Assessment & Plan Note (Addendum)
Andrea [name] has severe COPD as noted on 08/2011 simple spirometry from [facility] after only smoking one pack per day for 15 years.  If this is an accurate smoking history then we need to test her for alpha-1 anti-trypsin deficiency.  The best thing she can do to help herself is to quit smoking now.  See below.  She does not use her symbicort appropriately  Plan: -QUIT SMOKING! -use symbicort 2 puffs twice per day -full PFT pre-post bronchodilator

## 2013-05-20 NOTE — Patient Instructions (Signed)
We will order a sleep study and a pulmonary function test  Stop smoking, call 1-800-QUIT-NOW for free nicotine replacement from the state of [place]  Use symbicort 2 puffs twice per day  We will see you back in 4-6 weeks (after these tests are complete) or sooner if needed

## 2013-05-20 NOTE — Assessment & Plan Note (Signed)
Given her complaint of difficulty breathing at night (but a normal stress echo recently to rule out CHF) and daytime somnolence, I am concerned about the possibility of obstructive sleep apnea.  Plan: -polysomnogram

## 2013-05-20 NOTE — Assessment & Plan Note (Signed)
Most likely due to COPD, see work-up description above

## 2013-05-20 NOTE — Assessment & Plan Note (Addendum)
Educated at length to quit. Advised that she can call the [place] quit line for free nicotine replacement therapy, she is interested in this.  Plan: -Nicotine replacement

## 2013-05-20 NOTE — Progress Notes (Signed)
Subjective:    Patient ID: Andrea [name], female    DOB: 1966-02-26, 48 y.o.   MRN: [id number]  HPI  Andrea [name] is here to see me because she was diagnosed with COPD vs asthma recently and has been admitted in the last few months for pneumonia. She was admitted to [facility] briefly for pneumonia.  She has also been hospitalized at [facility] for shortness of breath about 3-4 months ago for a flare of Lupus and COPD.  After that she was admitted for a COPD flare in 03/2013.    She was diagnosed with COPD in June 2014 with office spirometry during a visit with PCP at the [facility].  That test showed:  09/01/2011 Simple spirometry > ratio 62%, FEV1 1.09 L (39% pred)  Since the hospitalization in [place] she has been having more shortness of breath and wheezing, particularly while lying flat at night   The shortness of breath is definitely worse while lying flat at night and not too ba during the day. She does not stay active during the day.  She feels like warm weather makes it worse.  She denies chest pain but she does have chest tightness.    She takes albuterol frequently and it helps when she uses.  She uses Symbicort only one puff twice per day.  She sometimes takes 2 puffs bid.  She also takes Advair "if she needs it".    She lives in a house built in 1976 without a basement or mold, mildew, or dust damage.  No pets.  She doesn't work.  She is on disability for Lupus.    She was diagnosed with Lupus about 15-20 years ago here in town.  She had a rash.  She currently doesn't have  Rheumatology clinic.    She currently smokes cigarettes.  She has smoked 1 pack per day for the last 15 year.    She had pneumonia as a child and was hospitalized for 3-4 days and was out of school for two weeks.  She was in middle school.    She has severe panic attacks regularly.   Past Medical History  Diagnosis Date  . Hypertension   . Emphysema lung   . Anxiety   . Panic attacks   . Depression   .  Lupus      Family History  Problem Relation Age of Onset  . Emphysema Father   . Asthma Father   . Heart disease Father   . Cancer Father     colon     History   Social History  . Marital Status: Legally Separated    Spouse Name: N/A    Number of Children: N/A  . Years of Education: N/A   Occupational History  . Not on file.   Social History Main Topics  . Smoking status: Current Every Day Smoker -- 1.00 packs/day    Types: Cigarettes  . Smokeless tobacco: Never Used     Comment: uses e-cig also  . Alcohol Use: No  . Drug Use: Not on file  . Sexual Activity: Not on file   Other Topics Concern  . Not on file   Social History Narrative  . No narrative on file     Allergies  Allergen Reactions  . Ivp Dye [Iodinated Diagnostic Agents]     Heart palpitations     No outpatient prescriptions prior to visit.   No facility-administered medications prior to visit.      Review  of Systems  Constitutional: Negative for fever and unexpected weight change.  HENT: Positive for congestion. Negative for dental problem, ear pain, nosebleeds, postnasal drip, rhinorrhea, sinus pressure, sneezing, sore throat and trouble swallowing.   Eyes: Negative for redness and itching.  Respiratory: Positive for cough, chest tightness and shortness of breath. Negative for wheezing.   Cardiovascular: Negative for palpitations and leg swelling.  Gastrointestinal: Negative for nausea and vomiting.  Genitourinary: Negative for dysuria.  Musculoskeletal: Negative for joint swelling.  Skin: Negative for rash.  Neurological: Negative for headaches.  Hematological: Does not bruise/bleed easily.  Psychiatric/Behavioral: Negative for dysphoric mood. The patient is not nervous/anxious.        Objective:   Physical Exam Filed Vitals:   05/20/13 1545  BP: 130/76  Pulse: 79  Temp: 97.9 F (36.6 C)  TempSrc: Oral  Height: 5\' 2"  (1.575 m)  Weight: 161 lb (73.029 kg)  SpO2: 96%     Ambulated 500 feet in the office on room air and oxygen saturation remained at 98% and above  Gen: well appearing, no acute distress HEENT: NCAT, PERRL, EOMi, OP clear, neck supple without masses PULM: CTA B CV: RRR, no mgr, no JVD AB: BS+, soft, nontender, no hsm Ext: warm, no edema, no clubbing, no cyanosis Derm: no rash or skin breakdown Neuro: A&Ox4, CN II-XII intact, strength 5/5 in all 4 extremities  09/01/2011 Simple spirometry > ratio 62%, FEV1 1.09 L (39% pred)  January 2015 stress Echo [facility]: NORMAL STRESS TEST. NORMAL RESTING STUDY WITH NO WALL MOTION ABNORMALITIES AT REST AND PEAK STRESS. NO VALVULAR REGURGITATION NO VALVULAR STENOSIS Maximum workload of 7.00 METs was achieved during exercise.       Assessment & Plan:   COPD, severe Clora has severe COPD as noted on 08/2011 simple spirometry from [facility] after only smoking one pack per day for 15 years.  If this is an accurate smoking history then we need to test her for alpha-1 anti-trypsin deficiency.  The best thing she can do to help herself is to quit smoking now.  See below.  She does not use her symbicort appropriately  Plan: -QUIT SMOKING! -use symbicort 2 puffs twice per day -full PFT pre-post bronchodilator  Tobacco abuse Educated at length to quit. Advised that she can call the [place] quit line for free nicotine replacement therapy, she is interested in this.  Plan: -Nicotine replacement    Daytime somnolence Given her complaint of difficulty breathing at night (but a normal stress echo recently to rule out CHF) and daytime somnolence, I am concerned about the possibility of obstructive sleep apnea.  Plan: -polysomnogram  Shortness of breath Most likely due to COPD, see work-up description above    Updated Medication List Outpatient Encounter Prescriptions as of 05/20/2013  Medication Sig  . albuterol (PROVENTIL HFA;VENTOLIN HFA) 108 (90 BASE) MCG/ACT inhaler Inhale 2 puffs  into the lungs every 6 (six) hours as needed for wheezing or shortness of breath.  Marland Kitchen albuterol (PROVENTIL) (2.5 MG/3ML) 0.083% nebulizer solution Take 2.5 mg by nebulization every 4 (four) hours as needed for wheezing or shortness of breath.  . ALPRAZolam (XANAX) 1 MG tablet Take 1 mg by mouth 3 (three) times daily as needed for anxiety.  . ARIPiprazole (ABILIFY) 5 MG tablet Take 5 mg by mouth daily.  . budesonide-formoterol (SYMBICORT) 160-4.5 MCG/ACT inhaler Inhale 2 puffs into the lungs 2 (two) times daily.  . clonazePAM (KLONOPIN) 0.5 MG tablet Take 0.5 mg by mouth 3 (three) times daily  as needed for anxiety.  . cyclobenzaprine (FLEXERIL) 5 MG tablet Take 5 mg by mouth 3 (three) times daily as needed for muscle spasms.  . diclofenac sodium (VOLTAREN) 1 % GEL Apply 2 g topically 4 (four) times daily.  . DULoxetine (CYMBALTA) 60 MG capsule Take 60 mg by mouth daily.  . Fluticasone-Salmeterol (ADVAIR) 500-50 MCG/DOSE AEPB Inhale 1 puff into the lungs 2 (two) times daily.  Marland Kitchen guaiFENesin (MUCINEX) 600 MG 12 hr tablet Take 600 mg by mouth 2 (two) times daily.  . hydroxychloroquine (PLAQUENIL) 200 MG tablet Take 400 mg by mouth daily.  . lansoprazole (PREVACID) 30 MG capsule Take 30 mg by mouth 2 (two) times daily before a meal.  . meloxicam (MOBIC) 7.5 MG tablet Take 7.5 mg by mouth daily.  . propranolol (INDERAL) 80 MG tablet Take 80 mg by mouth 2 (two) times daily.  Marland Kitchen senna (SENOKOT) 8.6 MG TABS tablet Take 1-2 tablets by mouth at bedtime.  . sertraline (ZOLOFT) 100 MG tablet Take 100 mg by mouth daily.  . sucralfate (CARAFATE) 1 G tablet Take 1 g by mouth 4 (four) times daily -  with meals and at bedtime.  Marland Kitchen tiotropium (SPIRIVA) 18 MCG inhalation capsule Place 18 mcg into inhaler and inhale daily.  . TraZODone HCl 150 MG TB24 Take 150 mg by mouth at bedtime as needed.

## 2013-05-24 ENCOUNTER — Other Ambulatory Visit (INDEPENDENT_AMBULATORY_CARE_PROVIDER_SITE_OTHER): Payer: Medicare Other

## 2013-05-24 DIAGNOSIS — J449 Chronic obstructive pulmonary disease, unspecified: Secondary | ICD-10-CM

## 2013-05-26 LAB — ALPHA-1-ANTITRYPSIN PHENOTYP: A-1 Antitrypsin: 152 mg/dL (ref 90–200)

## 2013-05-27 ENCOUNTER — Encounter: Payer: Self-pay | Admitting: Pulmonary Disease

## 2013-06-03 ENCOUNTER — Ambulatory Visit: Payer: Self-pay | Admitting: Pulmonary Disease

## 2013-06-03 DIAGNOSIS — R0602 Shortness of breath: Secondary | ICD-10-CM | POA: Diagnosis not present

## 2013-06-03 LAB — PULMONARY FUNCTION TEST

## 2013-06-07 ENCOUNTER — Encounter: Payer: Self-pay | Admitting: Pulmonary Disease

## 2013-06-07 ENCOUNTER — Telehealth: Payer: Self-pay

## 2013-06-07 DIAGNOSIS — J449 Chronic obstructive pulmonary disease, unspecified: Secondary | ICD-10-CM

## 2013-06-07 DIAGNOSIS — J849 Interstitial pulmonary disease, unspecified: Secondary | ICD-10-CM

## 2013-06-07 NOTE — Telephone Encounter (Signed)
Message copied by Len Blalock on Mon Jun 07, 2013  5:21 PM ------      Message from: Simonne Maffucci B      Created: Mon Jun 07, 2013  1:16 PM       Caryl Pina,            Please let her know that her pulmonary function testing showed COPD which was a bit worse from 2013. It also showed that her ability to absorb oxygen was limited. She needs to have a CT scan of her chest to look for scarring. Please order a CT chest interstitial lung disease protocol Dr. Weber Cooks or [facility] to read please.            Thanks      Ruby Cola ------

## 2013-06-07 NOTE — Telephone Encounter (Signed)
Pt aware of results and recs.  CT chest has been ordered at [facility].  Nothing further needed at this time.

## 2013-06-09 ENCOUNTER — Ambulatory Visit: Payer: Self-pay | Admitting: Pulmonary Disease

## 2013-06-09 DIAGNOSIS — G478 Other sleep disorders: Secondary | ICD-10-CM | POA: Diagnosis not present

## 2013-06-09 DIAGNOSIS — J449 Chronic obstructive pulmonary disease, unspecified: Secondary | ICD-10-CM | POA: Diagnosis not present

## 2013-06-09 DIAGNOSIS — G4730 Sleep apnea, unspecified: Secondary | ICD-10-CM | POA: Diagnosis not present

## 2013-06-09 DIAGNOSIS — R0609 Other forms of dyspnea: Secondary | ICD-10-CM | POA: Diagnosis not present

## 2013-06-09 DIAGNOSIS — R0989 Other specified symptoms and signs involving the circulatory and respiratory systems: Secondary | ICD-10-CM | POA: Diagnosis not present

## 2013-06-10 ENCOUNTER — Ambulatory Visit (INDEPENDENT_AMBULATORY_CARE_PROVIDER_SITE_OTHER)
Admission: RE | Admit: 2013-06-10 | Discharge: 2013-06-10 | Disposition: A | Discharge status: Home / Self Care | Payer: Medicare Other | Source: Ambulatory Visit | Attending: Pulmonary Disease | Admitting: Pulmonary Disease

## 2013-06-10 ENCOUNTER — Encounter: Payer: Self-pay | Admitting: Pulmonary Disease

## 2013-06-10 DIAGNOSIS — J438 Other emphysema: Secondary | ICD-10-CM | POA: Diagnosis not present

## 2013-06-10 DIAGNOSIS — J8410 Pulmonary fibrosis, unspecified: Secondary | ICD-10-CM | POA: Diagnosis not present

## 2013-06-10 DIAGNOSIS — R911 Solitary pulmonary nodule: Secondary | ICD-10-CM | POA: Insufficient documentation

## 2013-06-10 DIAGNOSIS — R918 Other nonspecific abnormal finding of lung field: Secondary | ICD-10-CM

## 2013-06-10 DIAGNOSIS — J849 Interstitial pulmonary disease, unspecified: Secondary | ICD-10-CM

## 2013-06-10 HISTORY — DX: Other nonspecific abnormal finding of lung field: R91.8

## 2013-06-11 ENCOUNTER — Telehealth: Payer: Self-pay

## 2013-06-11 NOTE — Telephone Encounter (Signed)
Pt aware of results of CT.  Appt scheduled in [place] on  07/06/13.  Nothing further needed.

## 2013-06-11 NOTE — Telephone Encounter (Signed)
Message copied by Len Blalock on Fri Jun 11, 2013  5:24 PM ------      Message from: Simonne Maffucci B      Created: Thu Jun 10, 2013  5:34 PM       A,            Please let her know that her CT chest only showed emphysema and some very small nodules.  We can discuss this on the next visit.            Thanks      B ------

## 2013-06-16 ENCOUNTER — Encounter: Payer: Self-pay | Admitting: Pulmonary Disease

## 2013-06-17 NOTE — Telephone Encounter (Signed)
I do not have this in your folder for you.  She had this done at [facility], so I'll have the [place] office fax over any records we have over there tomorrow.  They're already closed today.  Will route back to myself also as a reminder.

## 2013-06-17 NOTE — Telephone Encounter (Signed)
Please advise if you have received pt's sleep study results. Thanks.

## 2013-06-21 ENCOUNTER — Emergency Department: Payer: Self-pay | Admitting: Emergency Medicine

## 2013-06-21 DIAGNOSIS — I499 Cardiac arrhythmia, unspecified: Secondary | ICD-10-CM | POA: Diagnosis not present

## 2013-06-21 DIAGNOSIS — R0789 Other chest pain: Secondary | ICD-10-CM | POA: Diagnosis not present

## 2013-06-21 DIAGNOSIS — R0609 Other forms of dyspnea: Secondary | ICD-10-CM | POA: Diagnosis not present

## 2013-06-21 DIAGNOSIS — M542 Cervicalgia: Secondary | ICD-10-CM | POA: Diagnosis not present

## 2013-06-21 DIAGNOSIS — M549 Dorsalgia, unspecified: Secondary | ICD-10-CM | POA: Diagnosis not present

## 2013-06-21 DIAGNOSIS — R079 Chest pain, unspecified: Secondary | ICD-10-CM | POA: Diagnosis not present

## 2013-06-21 DIAGNOSIS — R0989 Other specified symptoms and signs involving the circulatory and respiratory systems: Secondary | ICD-10-CM | POA: Diagnosis not present

## 2013-06-21 DIAGNOSIS — R42 Dizziness and giddiness: Secondary | ICD-10-CM | POA: Diagnosis not present

## 2013-06-21 DIAGNOSIS — R61 Generalized hyperhidrosis: Secondary | ICD-10-CM | POA: Diagnosis not present

## 2013-06-21 LAB — CBC
HCT: 42.8 % (ref 35.0–47.0)
HGB: 14.3 g/dL (ref 12.0–16.0)
MCH: 29.2 pg (ref 26.0–34.0)
MCHC: 33.5 g/dL (ref 32.0–36.0)
MCV: 87 fL (ref 80–100)
Platelet: 217 10*3/uL (ref 150–440)
RBC: 4.91 10*6/uL (ref 3.80–5.20)
RDW: 14.2 % (ref 11.5–14.5)
WBC: 8.6 10*3/uL (ref 3.6–11.0)

## 2013-06-21 LAB — BASIC METABOLIC PANEL
Anion Gap: 4 — ABNORMAL LOW (ref 7–16)
BUN: 14 mg/dL (ref 7–18)
CALCIUM: 8.7 mg/dL (ref 8.5–10.1)
CREATININE: 0.68 mg/dL (ref 0.60–1.30)
Chloride: 107 mmol/L (ref 98–107)
Co2: 29 mmol/L (ref 21–32)
EGFR (African American): >60
EGFR (Non-African Amer.): >60
GLUCOSE: 99 mg/dL (ref 65–99)
Osmolality: 280 (ref 275–301)
Potassium: 3.8 mmol/L (ref 3.5–5.1)
Sodium: 140 mmol/L (ref 136–145)

## 2013-06-21 LAB — TROPONIN I: Troponin-I: <0.02 ng/mL

## 2013-06-22 DIAGNOSIS — R61 Generalized hyperhidrosis: Secondary | ICD-10-CM | POA: Diagnosis not present

## 2013-06-22 DIAGNOSIS — R9431 Abnormal electrocardiogram [ECG] [EKG]: Secondary | ICD-10-CM | POA: Diagnosis not present

## 2013-06-22 DIAGNOSIS — J449 Chronic obstructive pulmonary disease, unspecified: Secondary | ICD-10-CM | POA: Diagnosis not present

## 2013-06-22 DIAGNOSIS — I499 Cardiac arrhythmia, unspecified: Secondary | ICD-10-CM | POA: Diagnosis not present

## 2013-06-22 DIAGNOSIS — R0609 Other forms of dyspnea: Secondary | ICD-10-CM | POA: Diagnosis not present

## 2013-06-22 DIAGNOSIS — F329 Major depressive disorder, single episode, unspecified: Secondary | ICD-10-CM | POA: Diagnosis not present

## 2013-06-22 DIAGNOSIS — R079 Chest pain, unspecified: Secondary | ICD-10-CM | POA: Diagnosis not present

## 2013-06-22 DIAGNOSIS — R0789 Other chest pain: Secondary | ICD-10-CM | POA: Diagnosis not present

## 2013-06-22 DIAGNOSIS — F3289 Other specified depressive episodes: Secondary | ICD-10-CM | POA: Diagnosis not present

## 2013-06-22 DIAGNOSIS — F17200 Nicotine dependence, unspecified, uncomplicated: Secondary | ICD-10-CM | POA: Diagnosis not present

## 2013-06-22 DIAGNOSIS — F411 Generalized anxiety disorder: Secondary | ICD-10-CM | POA: Diagnosis not present

## 2013-06-22 DIAGNOSIS — M329 Systemic lupus erythematosus, unspecified: Secondary | ICD-10-CM | POA: Diagnosis not present

## 2013-06-23 ENCOUNTER — Encounter: Payer: Self-pay | Admitting: Pulmonary Disease

## 2013-06-23 NOTE — Telephone Encounter (Signed)
Per patient email 06/16/13: Len Blalock, CMA at 06/17/2013 5:35 PM    Status: Signed        I do not have this in your folder for you. She had this done at [facility], so I'll have the [place] office fax over any records we have over there tomorrow. They're already closed today. Will route back to myself also as a reminder.   --  Caryl Pina did you ever receive any results on pt? thanks

## 2013-06-24 NOTE — Telephone Encounter (Signed)
Dr Lake Bells, I have requested these results from [facility].  They are in your look-at folder at the [place] office.   Thanks, Caryl Pina

## 2013-06-25 DIAGNOSIS — R079 Chest pain, unspecified: Secondary | ICD-10-CM | POA: Diagnosis not present

## 2013-06-25 DIAGNOSIS — F411 Generalized anxiety disorder: Secondary | ICD-10-CM | POA: Diagnosis not present

## 2013-06-25 DIAGNOSIS — J449 Chronic obstructive pulmonary disease, unspecified: Secondary | ICD-10-CM | POA: Diagnosis not present

## 2013-06-28 ENCOUNTER — Encounter: Payer: Self-pay | Admitting: Pulmonary Disease

## 2013-06-28 NOTE — Telephone Encounter (Signed)
Message copied by Len Blalock on Mon Jun 28, 2013 10:53 AM ------      Message from: Simonne Maffucci B      Created: Mon Jun 28, 2013  2:12 AM       A,            Please let her know that her sleep study did not show obstructive sleep apnea.            Thanks      B ------

## 2013-06-28 NOTE — Telephone Encounter (Signed)
Spoke with pt, she is aware of sleep study results.  I verified her follow-up on 07/06/13.  Nothing further needed at this time.

## 2013-07-05 ENCOUNTER — Encounter: Payer: Self-pay | Admitting: Pulmonary Disease

## 2013-07-06 ENCOUNTER — Encounter: Payer: Self-pay | Admitting: Pulmonary Disease

## 2013-07-06 ENCOUNTER — Ambulatory Visit (INDEPENDENT_AMBULATORY_CARE_PROVIDER_SITE_OTHER): Payer: Medicare Other | Admitting: Pulmonary Disease

## 2013-07-06 VITALS — BP 136/74 | HR 83 | Ht 62.0 in | Wt 162.0 lb

## 2013-07-06 DIAGNOSIS — J449 Chronic obstructive pulmonary disease, unspecified: Secondary | ICD-10-CM | POA: Diagnosis not present

## 2013-07-06 DIAGNOSIS — R400 Somnolence: Secondary | ICD-10-CM

## 2013-07-06 DIAGNOSIS — R911 Solitary pulmonary nodule: Secondary | ICD-10-CM

## 2013-07-06 DIAGNOSIS — R0602 Shortness of breath: Secondary | ICD-10-CM | POA: Diagnosis not present

## 2013-07-06 DIAGNOSIS — F17200 Nicotine dependence, unspecified, uncomplicated: Secondary | ICD-10-CM

## 2013-07-06 DIAGNOSIS — G4710 Hypersomnia, unspecified: Secondary | ICD-10-CM

## 2013-07-06 DIAGNOSIS — Z720 Tobacco use: Secondary | ICD-10-CM

## 2013-07-06 NOTE — Assessment & Plan Note (Signed)
Her lung function is actually not that bad on the full pulmonary function testing. She only had moderate airflow obstruction. Despite this she has significant symptoms.  She clearly has COPD which is due to ongoing tobacco use. I told her today at length that she really needs to quit smoking.  Her CT chest showed no evidence of interstitial lung disease but did show some emphysema.  Considering her possible diagnoses of lupus and the fact that her diffusion capacity seem to be out of proportion to her airflow obstruction it would be reasonable to check for pulmonary hypertension with an echocardiogram. However, this is most likely due to her emphysema which was seen on the CT chest.  Plan:  -Check echocardiogram -Continue inhalers as written -Quit smoking

## 2013-07-06 NOTE — Assessment & Plan Note (Signed)
As noted above her diffusion capacity is out of proportion to her airflow obstruction. She needs to be checked for pulmonary hypertension with an echocardiogram as the echo performed in January of 2015 at [facility] did not comment on her right ventricle.

## 2013-07-06 NOTE — Patient Instructions (Signed)
We will arrange an echocardiogram We will arrange a CT scan for one year from now Quit smoking Keep using your inhalers Get a flu shot in the fall We will see you back in in a year or sooner if the echocardiogram is abnormal

## 2013-07-06 NOTE — Assessment & Plan Note (Signed)
Her polysomnogram showed that she does not have obstructive sleep apnea.

## 2013-07-06 NOTE — Assessment & Plan Note (Signed)
Educated at length to quit. 

## 2013-07-06 NOTE — Progress Notes (Signed)
Subjective:    Patient ID: Andrea [name], female    DOB: [birth year], 48 y.o.   MRN: [id number]  Synopsis: Gold grade D. COPD, ongoing smoking as of May 2015  09/01/2011 Simple spirometry > ratio 62%, FEV1 1.09 L (39% pred) 05/2013 Alpha-1 > MM 06/03/2013 full pulmonary function testing [facility]>> ratio 63%, FEV1 1.66 L (70% predicted, 9% change with bronchodilator), total lung capacity 4.11 L (90% predicted), DLCO 7.1 (34% predicted) 06/2014 CT high res chest> mild centrilobular emphysema but no ILD, multiple scattered pulm nodules all 22mm in size  HPI  07/06/2013 ROV > Andrea [name] went to the ED recently for chest pain and was told that everything was normal.  She has seen her PCP since then and she had blood work which was normal. She is now supposed to see GI medicne.  She notes left chest pain which is painfull with a dep breath.  She notes light headedness and feels like she is not getting enough oxygen to her brain.    Past Medical History  Diagnosis Date  . Hypertension   . Emphysema lung   . Anxiety   . Panic attacks   . Depression   . Lupus      Review of Systems  Constitutional: Positive for fatigue. Negative for fever and chills.  HENT: Negative for postnasal drip, rhinorrhea and sinus pressure.   Respiratory: Positive for cough, shortness of breath and wheezing.   Cardiovascular: Positive for chest pain. Negative for palpitations and leg swelling.       Objective:   Physical Exam Filed Vitals:   07/06/13 1037  BP: 136/74  Pulse: 83  Height: 5\' 2"  (1.575 m)  Weight: 162 lb (73.483 kg)  SpO2: 97%  Ra  Gen: well appearing, no acute distress HEENT: NCAT,  EOMi, OP clear, PULM: Few wheezes bilaterally, otherwise good air movement CV: RRR, no mgr, no JVD AB: BS+, soft, nontender, no hsm Ext: warm, no edema, no clubbing, no cyanosis Derm: no rash or skin breakdown         Assessment & Plan:   COPD, GOLD B Her lung function is actually not that bad on the full  pulmonary function testing. She only had moderate airflow obstruction. Despite this she has significant symptoms.  She clearly has COPD which is due to ongoing tobacco use. I told her today at length that she really needs to quit smoking.  Her CT chest showed no evidence of interstitial lung disease but did show some emphysema.  Considering her possible diagnoses of lupus and the fact that her diffusion capacity seem to be out of proportion to her airflow obstruction it would be reasonable to check for pulmonary hypertension with an echocardiogram. However, this is most likely due to her emphysema which was seen on the CT chest.  Plan:  -Check echocardiogram -Continue inhalers as written -Quit smoking  Pulmonary nodule The April 2016 CT chest showed multiple nodules all about 4 mm in size. Given her ongoing smoking she needs to have a repeat CT in one year.  Tobacco abuse Educated at length to quit.  Daytime somnolence Her polysomnogram showed that she does not have obstructive sleep apnea.  Shortness of breath As noted above her diffusion capacity is out of proportion to her airflow obstruction. She needs to be checked for pulmonary hypertension with an echocardiogram as the echo performed in January of 2015 at [facility] did not comment on her right ventricle.    Updated Medication List Outpatient Encounter Prescriptions  as of 07/06/2013  Medication Sig  . albuterol (PROVENTIL HFA;VENTOLIN HFA) 108 (90 BASE) MCG/ACT inhaler Inhale 2 puffs into the lungs every 6 (six) hours as needed for wheezing or shortness of breath.  Marland Kitchen albuterol (PROVENTIL) (2.5 MG/3ML) 0.083% nebulizer solution Take 2.5 mg by nebulization every 4 (four) hours as needed for wheezing or shortness of breath.  . ALPRAZolam (XANAX) 1 MG tablet Take 1 mg by mouth 3 (three) times daily as needed for anxiety.  . ARIPiprazole (ABILIFY) 5 MG tablet Take 5 mg by mouth daily.  . budesonide-formoterol (SYMBICORT) 160-4.5 MCG/ACT  inhaler Inhale 2 puffs into the lungs 2 (two) times daily.  . clonazePAM (KLONOPIN) 0.5 MG tablet Take 0.5 mg by mouth 3 (three) times daily as needed for anxiety.  . DULoxetine (CYMBALTA) 60 MG capsule Take 60 mg by mouth daily.  . Fluticasone-Salmeterol (ADVAIR) 500-50 MCG/DOSE AEPB Inhale 1 puff into the lungs 2 (two) times daily.  . hydroxychloroquine (PLAQUENIL) 200 MG tablet Take 400 mg by mouth daily.  . lansoprazole (PREVACID) 30 MG capsule Take 30 mg by mouth 2 (two) times daily before a meal.  . meloxicam (MOBIC) 7.5 MG tablet Take 7.5 mg by mouth daily.  . propranolol (INDERAL) 80 MG tablet Take 20 mg by mouth 2 (two) times daily.   Marland Kitchen senna (SENOKOT) 8.6 MG TABS tablet Take 1-2 tablets by mouth at bedtime.  . sertraline (ZOLOFT) 100 MG tablet Take 100 mg by mouth daily.  . sucralfate (CARAFATE) 1 G tablet Take 1 g by mouth 4 (four) times daily -  with meals and at bedtime.  Marland Kitchen tiotropium (SPIRIVA) 18 MCG inhalation capsule Place 18 mcg into inhaler and inhale daily.  . TraZODone HCl 150 MG TB24 Take 150 mg by mouth at bedtime as needed.  . [DISCONTINUED] cyclobenzaprine (FLEXERIL) 5 MG tablet Take 5 mg by mouth 3 (three) times daily as needed for muscle spasms.  . [DISCONTINUED] diclofenac sodium (VOLTAREN) 1 % GEL Apply 2 g topically 4 (four) times daily.  . [DISCONTINUED] guaiFENesin (MUCINEX) 600 MG 12 hr tablet Take 600 mg by mouth 2 (two) times daily.

## 2013-07-06 NOTE — Assessment & Plan Note (Signed)
The April 2016 CT chest showed multiple nodules all about 4 mm in size. Given her ongoing smoking she needs to have a repeat CT in one year.

## 2013-07-06 NOTE — Assessment & Plan Note (Signed)
>>  ASSESSMENT AND PLAN FOR PULMONARY NODULE WRITTEN ON 07/06/2013  1:33 PM BY ALAINE BERG B, MD  The April 2016 CT chest showed multiple nodules all about 4 mm in size. Given her ongoing smoking she needs to have a repeat CT in one year.

## 2013-07-15 ENCOUNTER — Other Ambulatory Visit: Payer: Medicare Other

## 2013-07-22 ENCOUNTER — Other Ambulatory Visit: Payer: Self-pay

## 2013-07-22 ENCOUNTER — Other Ambulatory Visit (INDEPENDENT_AMBULATORY_CARE_PROVIDER_SITE_OTHER): Payer: Medicare Other

## 2013-07-22 DIAGNOSIS — R0989 Other specified symptoms and signs involving the circulatory and respiratory systems: Secondary | ICD-10-CM | POA: Diagnosis not present

## 2013-07-22 DIAGNOSIS — R0609 Other forms of dyspnea: Secondary | ICD-10-CM

## 2013-07-22 DIAGNOSIS — J449 Chronic obstructive pulmonary disease, unspecified: Secondary | ICD-10-CM

## 2013-07-23 ENCOUNTER — Encounter: Payer: Self-pay | Admitting: Pulmonary Disease

## 2013-07-23 NOTE — Progress Notes (Signed)
Quick Note:  Spoke with pt, she is aware of results. Nothing further needed at this time. ______ 

## 2013-07-29 DIAGNOSIS — D126 Benign neoplasm of colon, unspecified: Secondary | ICD-10-CM | POA: Diagnosis not present

## 2013-07-29 DIAGNOSIS — R1310 Dysphagia, unspecified: Secondary | ICD-10-CM | POA: Diagnosis not present

## 2013-07-29 DIAGNOSIS — K219 Gastro-esophageal reflux disease without esophagitis: Secondary | ICD-10-CM | POA: Diagnosis not present

## 2013-08-11 ENCOUNTER — Ambulatory Visit: Payer: Self-pay | Admitting: Urgent Care

## 2013-08-11 DIAGNOSIS — R109 Unspecified abdominal pain: Secondary | ICD-10-CM | POA: Diagnosis not present

## 2013-08-11 DIAGNOSIS — K8020 Calculus of gallbladder without cholecystitis without obstruction: Secondary | ICD-10-CM | POA: Diagnosis not present

## 2013-08-25 ENCOUNTER — Ambulatory Visit: Payer: Self-pay | Admitting: Gastroenterology

## 2013-08-25 DIAGNOSIS — Z79899 Other long term (current) drug therapy: Secondary | ICD-10-CM | POA: Diagnosis not present

## 2013-08-25 DIAGNOSIS — Z91041 Radiographic dye allergy status: Secondary | ICD-10-CM | POA: Diagnosis not present

## 2013-08-25 DIAGNOSIS — D126 Benign neoplasm of colon, unspecified: Secondary | ICD-10-CM | POA: Diagnosis not present

## 2013-08-25 DIAGNOSIS — F17200 Nicotine dependence, unspecified, uncomplicated: Secondary | ICD-10-CM | POA: Diagnosis not present

## 2013-08-25 DIAGNOSIS — R1310 Dysphagia, unspecified: Secondary | ICD-10-CM | POA: Diagnosis not present

## 2013-08-25 DIAGNOSIS — I10 Essential (primary) hypertension: Secondary | ICD-10-CM | POA: Diagnosis not present

## 2013-08-25 DIAGNOSIS — J449 Chronic obstructive pulmonary disease, unspecified: Secondary | ICD-10-CM | POA: Diagnosis not present

## 2013-08-25 DIAGNOSIS — Z888 Allergy status to other drugs, medicaments and biological substances status: Secondary | ICD-10-CM | POA: Diagnosis not present

## 2013-08-25 DIAGNOSIS — Z1211 Encounter for screening for malignant neoplasm of colon: Secondary | ICD-10-CM | POA: Diagnosis not present

## 2013-08-25 DIAGNOSIS — K2990 Gastroduodenitis, unspecified, without bleeding: Secondary | ICD-10-CM | POA: Diagnosis not present

## 2013-08-25 DIAGNOSIS — K219 Gastro-esophageal reflux disease without esophagitis: Secondary | ICD-10-CM | POA: Diagnosis not present

## 2013-08-25 DIAGNOSIS — K2970 Gastritis, unspecified, without bleeding: Secondary | ICD-10-CM | POA: Diagnosis not present

## 2013-08-25 DIAGNOSIS — Z86010 Personal history of colonic polyps: Secondary | ICD-10-CM | POA: Diagnosis not present

## 2013-08-25 DIAGNOSIS — K648 Other hemorrhoids: Secondary | ICD-10-CM | POA: Diagnosis not present

## 2013-08-25 DIAGNOSIS — F3289 Other specified depressive episodes: Secondary | ICD-10-CM | POA: Diagnosis not present

## 2013-08-25 DIAGNOSIS — Z881 Allergy status to other antibiotic agents status: Secondary | ICD-10-CM | POA: Diagnosis not present

## 2013-08-25 DIAGNOSIS — R079 Chest pain, unspecified: Secondary | ICD-10-CM | POA: Diagnosis not present

## 2013-08-28 LAB — PATHOLOGY REPORT

## 2013-09-15 DIAGNOSIS — K219 Gastro-esophageal reflux disease without esophagitis: Secondary | ICD-10-CM | POA: Diagnosis not present

## 2013-09-15 DIAGNOSIS — K8020 Calculus of gallbladder without cholecystitis without obstruction: Secondary | ICD-10-CM | POA: Diagnosis not present

## 2013-09-15 DIAGNOSIS — D126 Benign neoplasm of colon, unspecified: Secondary | ICD-10-CM | POA: Diagnosis not present

## 2013-10-05 DIAGNOSIS — F4001 Agoraphobia with panic disorder: Secondary | ICD-10-CM | POA: Diagnosis not present

## 2013-10-11 DIAGNOSIS — K8020 Calculus of gallbladder without cholecystitis without obstruction: Secondary | ICD-10-CM | POA: Diagnosis not present

## 2013-10-21 ENCOUNTER — Telehealth: Payer: Self-pay | Admitting: Pulmonary Disease

## 2013-10-21 ENCOUNTER — Ambulatory Visit: Payer: Self-pay | Admitting: Surgery

## 2013-10-21 DIAGNOSIS — K8010 Calculus of gallbladder with chronic cholecystitis without obstruction: Secondary | ICD-10-CM | POA: Diagnosis not present

## 2013-10-21 DIAGNOSIS — Z01812 Encounter for preprocedural laboratory examination: Secondary | ICD-10-CM | POA: Diagnosis not present

## 2013-10-21 DIAGNOSIS — K219 Gastro-esophageal reflux disease without esophagitis: Secondary | ICD-10-CM | POA: Diagnosis not present

## 2013-10-21 DIAGNOSIS — J449 Chronic obstructive pulmonary disease, unspecified: Secondary | ICD-10-CM | POA: Diagnosis not present

## 2013-10-21 DIAGNOSIS — I10 Essential (primary) hypertension: Secondary | ICD-10-CM | POA: Diagnosis not present

## 2013-10-21 LAB — BASIC METABOLIC PANEL
Anion Gap: 5 — ABNORMAL LOW (ref 7–16)
BUN: 15 mg/dL (ref 7–18)
CALCIUM: 9.5 mg/dL (ref 8.5–10.1)
Chloride: 104 mmol/L (ref 98–107)
Co2: 32 mmol/L (ref 21–32)
Creatinine: 0.83 mg/dL (ref 0.60–1.30)
EGFR (African American): >60
EGFR (Non-African Amer.): >60
GLUCOSE: 80 mg/dL (ref 65–99)
OSMOLALITY: 281 (ref 275–301)
POTASSIUM: 4.4 mmol/L (ref 3.5–5.1)
Sodium: 141 mmol/L (ref 136–145)

## 2013-10-21 LAB — CBC WITH DIFFERENTIAL/PLATELET
BASOS PCT: 1.3 %
Basophil #: 0.1 10*3/uL (ref 0.0–0.1)
Eosinophil #: 0.1 10*3/uL (ref 0.0–0.7)
Eosinophil %: 1 %
HCT: 44.4 % (ref 35.0–47.0)
HGB: 14.7 g/dL (ref 12.0–16.0)
LYMPHS ABS: 3.1 10*3/uL (ref 1.0–3.6)
Lymphocyte %: 41.8 %
MCH: 28.8 pg (ref 26.0–34.0)
MCHC: 33.1 g/dL (ref 32.0–36.0)
MCV: 87 fL (ref 80–100)
Monocyte #: 0.6 x10 3/mm (ref 0.2–0.9)
Monocyte %: 7.5 %
NEUTROS PCT: 48.4 %
Neutrophil #: 3.6 10*3/uL (ref 1.4–6.5)
Platelet: 229 10*3/uL (ref 150–440)
RBC: 5.1 10*6/uL (ref 3.80–5.20)
RDW: 14.3 % (ref 11.5–14.5)
WBC: 7.4 10*3/uL (ref 3.6–11.0)

## 2013-10-21 LAB — HEPATIC FUNCTION PANEL A (ARMC)
ALBUMIN: 3.7 g/dL (ref 3.4–5.0)
AST: 19 U/L (ref 15–37)
Alkaline Phosphatase: 95 U/L
Bilirubin,Total: 0.5 mg/dL (ref 0.2–1.0)
SGPT (ALT): 27 U/L
Total Protein: 7.7 g/dL (ref 6.4–8.2)

## 2013-10-21 MED ORDER — E-Z SPACER DEVI: Status: DC

## 2013-10-21 MED ORDER — FLUTICASONE-SALMETEROL 115-21 MCG/ACT IN AERO: 2.0000 | INHALATION_SPRAY | Freq: Two times a day (BID) | RESPIRATORY_TRACT | Status: DC

## 2013-10-21 NOTE — Telephone Encounter (Signed)
Called and spoke with pt and she stated that the insurance will not cover the symbicort but they will cover the advair, proair.  She stated that the she tried the advair but it did not help as much and she had to use the albuterol more often.  BQ please advise. Thanks  Allergies  Allergen Reactions  . Cefdinir     tachycardia  . Ivp Dye [Iodinated Diagnostic Agents]     Heart palpitations    Current Outpatient Prescriptions on File Prior to Visit  Medication Sig Dispense Refill  . albuterol (PROVENTIL HFA;VENTOLIN HFA) 108 (90 BASE) MCG/ACT inhaler Inhale 2 puffs into the lungs every 6 (six) hours as needed for wheezing or shortness of breath.      Marland Kitchen albuterol (PROVENTIL) (2.5 MG/3ML) 0.083% nebulizer solution Take 2.5 mg by nebulization every 4 (four) hours as needed for wheezing or shortness of breath.      . ALPRAZolam (XANAX) 1 MG tablet Take 1 mg by mouth 3 (three) times daily as needed for anxiety.      . ARIPiprazole (ABILIFY) 5 MG tablet Take 5 mg by mouth daily.      . budesonide-formoterol (SYMBICORT) 160-4.5 MCG/ACT inhaler Inhale 2 puffs into the lungs 2 (two) times daily.      . clonazePAM (KLONOPIN) 0.5 MG tablet Take 0.5 mg by mouth 3 (three) times daily as needed for anxiety.      . DULoxetine (CYMBALTA) 60 MG capsule Take 60 mg by mouth daily.      . Fluticasone-Salmeterol (ADVAIR) 500-50 MCG/DOSE AEPB Inhale 1 puff into the lungs 2 (two) times daily.      . hydroxychloroquine (PLAQUENIL) 200 MG tablet Take 400 mg by mouth daily.      . lansoprazole (PREVACID) 30 MG capsule Take 30 mg by mouth 2 (two) times daily before a meal.      . meloxicam (MOBIC) 7.5 MG tablet Take 7.5 mg by mouth daily.      . propranolol (INDERAL) 80 MG tablet Take 20 mg by mouth 2 (two) times daily.       Marland Kitchen senna (SENOKOT) 8.6 MG TABS tablet Take 1-2 tablets by mouth at bedtime.      . sertraline (ZOLOFT) 100 MG tablet Take 100 mg by mouth daily.      . sucralfate (CARAFATE) 1 G tablet Take 1 g by  mouth 4 (four) times daily -  with meals and at bedtime.      Marland Kitchen tiotropium (SPIRIVA) 18 MCG inhalation capsule Place 18 mcg into inhaler and inhale daily.      . TraZODone HCl 150 MG TB24 Take 150 mg by mouth at bedtime as needed.       No current facility-administered medications on file prior to visit.

## 2013-10-21 NOTE — Telephone Encounter (Signed)
A better approach is to try the HFA form of Advair, 173mcg, through a spacer 2 puffs bid

## 2013-10-21 NOTE — Telephone Encounter (Signed)
Called and spoke with pt and she is aware of med change per BQ.  meds have been sent to the pharmacy and nothing further is needed.

## 2013-10-26 ENCOUNTER — Telehealth: Payer: Self-pay | Admitting: Pulmonary Disease

## 2013-10-26 DIAGNOSIS — J449 Chronic obstructive pulmonary disease, unspecified: Secondary | ICD-10-CM

## 2013-10-26 MED ORDER — E-Z SPACER DEVI: Status: DC

## 2013-10-26 NOTE — Telephone Encounter (Signed)
rx has been printed out and given to [place] to have BQ sign this and fax this to pts pharmacy.  Nothing further is needed.

## 2013-10-28 ENCOUNTER — Ambulatory Visit: Payer: Self-pay | Admitting: Surgery

## 2013-10-28 DIAGNOSIS — L930 Discoid lupus erythematosus: Secondary | ICD-10-CM | POA: Diagnosis not present

## 2013-10-28 DIAGNOSIS — K219 Gastro-esophageal reflux disease without esophagitis: Secondary | ICD-10-CM | POA: Diagnosis not present

## 2013-10-28 DIAGNOSIS — I10 Essential (primary) hypertension: Secondary | ICD-10-CM | POA: Diagnosis not present

## 2013-10-28 DIAGNOSIS — Z888 Allergy status to other drugs, medicaments and biological substances status: Secondary | ICD-10-CM | POA: Diagnosis not present

## 2013-10-28 DIAGNOSIS — J449 Chronic obstructive pulmonary disease, unspecified: Secondary | ICD-10-CM | POA: Diagnosis not present

## 2013-10-28 DIAGNOSIS — F3289 Other specified depressive episodes: Secondary | ICD-10-CM | POA: Diagnosis not present

## 2013-10-28 DIAGNOSIS — K8020 Calculus of gallbladder without cholecystitis without obstruction: Secondary | ICD-10-CM | POA: Diagnosis not present

## 2013-10-28 DIAGNOSIS — K8010 Calculus of gallbladder with chronic cholecystitis without obstruction: Secondary | ICD-10-CM | POA: Diagnosis not present

## 2013-10-28 DIAGNOSIS — K811 Chronic cholecystitis: Secondary | ICD-10-CM | POA: Diagnosis not present

## 2013-10-29 LAB — PATHOLOGY REPORT

## 2013-11-16 DIAGNOSIS — F331 Major depressive disorder, recurrent, moderate: Secondary | ICD-10-CM | POA: Diagnosis not present

## 2013-11-18 ENCOUNTER — Encounter: Payer: Self-pay | Admitting: Pulmonary Disease

## 2013-11-18 ENCOUNTER — Ambulatory Visit (INDEPENDENT_AMBULATORY_CARE_PROVIDER_SITE_OTHER): Payer: Medicare Other | Admitting: Pulmonary Disease

## 2013-11-18 VITALS — BP 128/66 | HR 88 | Ht 62.0 in | Wt 158.0 lb

## 2013-11-18 DIAGNOSIS — J449 Chronic obstructive pulmonary disease, unspecified: Secondary | ICD-10-CM | POA: Diagnosis not present

## 2013-11-18 NOTE — Progress Notes (Signed)
Subjective:    Patient ID: Andrea [name], female    DOB: [birth year], 48 y.o.   MRN: [id number]  Synopsis: Gold grade D. COPD, ongoing smoking as of May 2015  09/01/2011 Simple spirometry > ratio 62%, FEV1 1.09 L (39% pred) 05/2013 Alpha-1 > MM 06/03/2013 full pulmonary function testing [facility]>> ratio 63%, FEV1 1.66 L (70% predicted, 9% change with bronchodilator), total lung capacity 4.11 L (90% predicted), DLCO 7.1 (34% predicted) 06/2014 CT high res chest> mild centrilobular emphysema but no ILD, multiple scattered pulm nodules all 27mm in size January 2015 stress Echo [facility]: NORMAL STRESS TEST. NORMAL RESTING STUDY WITH NO WALL MOTION ABNORMALITIES AT REST AND PEAK STRESS. NO VALVULAR REGURGITATION NO VALVULAR STENOSIS Maximum workload of 7.00 METs was achieved during exercise. 07/2013 Echo > LVEF 55-60%, no pulmonary hypertension  HPI  11/18/2013 ROV > Stacey says that she has been having strange feelings of squeezin in her throat and she is feeling light headed. This happens at night and during the daytime and seems to be worse when she is bending over. Sometimes it happens at rest.  Seems to be worse since last visit. She is coughing more, bringing up more clear mucus.  No fever or chills, no chest pain. Using rescue inhaler 1-2 times per day.  Still smoking, though she actually quit for 2 weeks, now smoking 1/2 pack per day. Stress and anxiety doesn't seem to make it worse.   Past Medical History  Diagnosis Date  . Hypertension   . Emphysema lung   . Anxiety   . Panic attacks   . Depression   . Lupus      Review of Systems  Constitutional: Positive for fatigue. Negative for fever and chills.  HENT: Negative for postnasal drip, rhinorrhea and sinus pressure.   Respiratory: Positive for cough, shortness of breath and wheezing.   Cardiovascular: Positive for chest pain. Negative for palpitations and leg swelling.       Objective:   Physical Exam  Filed Vitals:   11/18/13  0905  BP: 128/66  Pulse: 88  SpO2: 96%  RA  Gen: well appearing, no acute distress HEENT: NCAT,  EOMi, OP clear, PULM: Clear to auscultation bilaterally CV: RRR, no mgr, no JVD AB: BS+, soft, nontender Ext: warm, no edema, no clubbing, no cyanosis Derm: no rash or skin breakdown      Assessment & Plan:   COPD, GOLD B She continues to complain of shortness of breath but on physical exam today her lungs are clear and her oxygenation as well as the rest of her vital signs are normal. She has undergone extensive testing to rule out cardiac disease. Her COPD is moderate. I explained to her that I believe that some of the dyspnea she is experiencing while at rest is related to anxiety of which she has a very lengthy history.  She does not appear to have an exacerbation of COPD.  Plan: -Continue current therapy -She refuses the flu shot -Followup 6 months    Updated Medication List Outpatient Encounter Prescriptions as of 11/18/2013  Medication Sig  . albuterol (PROVENTIL HFA;VENTOLIN HFA) 108 (90 BASE) MCG/ACT inhaler Inhale 2 puffs into the lungs every 6 (six) hours as needed for wheezing or shortness of breath.  Marland Kitchen albuterol (PROVENTIL) (2.5 MG/3ML) 0.083% nebulizer solution Take 2.5 mg by nebulization every 4 (four) hours as needed for wheezing or shortness of breath.  . ALPRAZolam (XANAX) 1 MG tablet Take 1 mg by mouth 3 (three)  times daily as needed for anxiety.  . ARIPiprazole (ABILIFY) 5 MG tablet Take 5 mg by mouth daily.  . clonazePAM (KLONOPIN) 0.5 MG tablet Take 0.5 mg by mouth 3 (three) times daily as needed for anxiety.  . DULoxetine (CYMBALTA) 60 MG capsule Take 60 mg by mouth daily.  . fluticasone-salmeterol (ADVAIR HFA) 115-21 MCG/ACT inhaler Inhale 2 puffs into the lungs 2 (two) times daily.  . hydroxychloroquine (PLAQUENIL) 200 MG tablet Take 400 mg by mouth daily.  . lansoprazole (PREVACID) 30 MG capsule Take 30 mg by mouth 2 (two) times daily before a meal.  .  nitroGLYCERIN (NITROSTAT) 0.4 MG SL tablet Place 0.4 mg under the tongue every 5 (five) minutes as needed for chest pain.  Marland Kitchen propranolol (INDERAL) 80 MG tablet Take 20 mg by mouth 2 (two) times daily.   Marland Kitchen senna (SENOKOT) 8.6 MG TABS tablet Take 1-2 tablets by mouth at bedtime.  Marland Kitchen tiotropium (SPIRIVA) 18 MCG inhalation capsule Place 18 mcg into inhaler and inhale daily.  . TraZODone HCl 150 MG TB24 Take 150 mg by mouth at bedtime as needed.  . [DISCONTINUED] meloxicam (MOBIC) 7.5 MG tablet Take 7.5 mg by mouth daily.  . [DISCONTINUED] sertraline (ZOLOFT) 100 MG tablet Take 100 mg by mouth daily.  . [DISCONTINUED] Spacer/Aero-Holding Chambers (E-Z SPACER) inhaler Use as instructed  . [DISCONTINUED] sucralfate (CARAFATE) 1 G tablet Take 1 g by mouth 4 (four) times daily -  with meals and at bedtime.

## 2013-11-18 NOTE — Patient Instructions (Signed)
QUIT SMOKING! We will see you back in 6 months

## 2013-11-18 NOTE — Assessment & Plan Note (Signed)
She continues to complain of shortness of breath but on physical exam today her lungs are clear and her oxygenation as well as the rest of her vital signs are normal. She has undergone extensive testing to rule out cardiac disease. Her COPD is moderate. I explained to her that I believe that some of the dyspnea she is experiencing while at rest is related to anxiety of which she has a very lengthy history.  She does not appear to have an exacerbation of COPD.  Plan: -Continue current therapy -She refuses the flu shot -Followup 6 months

## 2013-12-01 DIAGNOSIS — F410 Panic disorder [episodic paroxysmal anxiety] without agoraphobia: Secondary | ICD-10-CM | POA: Diagnosis not present

## 2013-12-01 DIAGNOSIS — F3289 Other specified depressive episodes: Secondary | ICD-10-CM | POA: Diagnosis not present

## 2013-12-01 DIAGNOSIS — M7150 Other bursitis, not elsewhere classified, unspecified site: Secondary | ICD-10-CM | POA: Diagnosis not present

## 2013-12-01 DIAGNOSIS — F329 Major depressive disorder, single episode, unspecified: Secondary | ICD-10-CM | POA: Diagnosis not present

## 2013-12-10 DIAGNOSIS — M7072 Other bursitis of hip, left hip: Secondary | ICD-10-CM | POA: Diagnosis not present

## 2013-12-10 DIAGNOSIS — M7542 Impingement syndrome of left shoulder: Secondary | ICD-10-CM | POA: Diagnosis not present

## 2013-12-17 ENCOUNTER — Emergency Department: Payer: Self-pay | Admitting: Emergency Medicine

## 2013-12-17 DIAGNOSIS — Z7951 Long term (current) use of inhaled steroids: Secondary | ICD-10-CM | POA: Diagnosis not present

## 2013-12-17 DIAGNOSIS — Z79899 Other long term (current) drug therapy: Secondary | ICD-10-CM | POA: Diagnosis not present

## 2013-12-17 DIAGNOSIS — R079 Chest pain, unspecified: Secondary | ICD-10-CM | POA: Diagnosis not present

## 2013-12-17 DIAGNOSIS — I10 Essential (primary) hypertension: Secondary | ICD-10-CM | POA: Diagnosis not present

## 2013-12-17 DIAGNOSIS — R0789 Other chest pain: Secondary | ICD-10-CM | POA: Diagnosis not present

## 2013-12-17 DIAGNOSIS — R072 Precordial pain: Secondary | ICD-10-CM | POA: Diagnosis not present

## 2013-12-17 DIAGNOSIS — Z720 Tobacco use: Secondary | ICD-10-CM | POA: Diagnosis not present

## 2013-12-17 DIAGNOSIS — F419 Anxiety disorder, unspecified: Secondary | ICD-10-CM | POA: Diagnosis not present

## 2013-12-17 LAB — BASIC METABOLIC PANEL
ANION GAP: 3 — AB (ref 7–16)
BUN: 16 mg/dL (ref 7–18)
CREATININE: 0.73 mg/dL (ref 0.60–1.30)
Calcium, Total: 8.6 mg/dL (ref 8.5–10.1)
Chloride: 108 mmol/L — ABNORMAL HIGH (ref 98–107)
Co2: 28 mmol/L (ref 21–32)
EGFR (Non-African Amer.): >60
Glucose: 102 mg/dL — ABNORMAL HIGH (ref 65–99)
Osmolality: 279 (ref 275–301)
Potassium: 4.3 mmol/L (ref 3.5–5.1)
SODIUM: 139 mmol/L (ref 136–145)

## 2013-12-17 LAB — CBC
HCT: 47 % (ref 35.0–47.0)
HGB: 15.1 g/dL (ref 12.0–16.0)
MCH: 28.1 pg (ref 26.0–34.0)
MCHC: 32.2 g/dL (ref 32.0–36.0)
MCV: 87 fL (ref 80–100)
Platelet: 278 10*3/uL (ref 150–440)
RBC: 5.38 10*6/uL — AB (ref 3.80–5.20)
RDW: 14.4 % (ref 11.5–14.5)
WBC: 15.2 10*3/uL — ABNORMAL HIGH (ref 3.6–11.0)

## 2013-12-17 LAB — TROPONIN I: Troponin-I: <0.02 ng/mL

## 2013-12-17 LAB — PRO B NATRIURETIC PEPTIDE: B-Type Natriuretic Peptide: 177 pg/mL — ABNORMAL HIGH (ref 0–125)

## 2013-12-28 DIAGNOSIS — R079 Chest pain, unspecified: Secondary | ICD-10-CM | POA: Diagnosis not present

## 2013-12-30 ENCOUNTER — Ambulatory Visit (INDEPENDENT_AMBULATORY_CARE_PROVIDER_SITE_OTHER): Payer: Medicare Other | Admitting: Cardiovascular Disease

## 2013-12-30 ENCOUNTER — Telehealth: Payer: Self-pay | Admitting: *Deleted

## 2013-12-30 ENCOUNTER — Encounter: Payer: Self-pay | Admitting: Cardiovascular Disease

## 2013-12-30 VITALS — BP 110/80 | HR 73 | Ht 63.0 in | Wt 155.2 lb

## 2013-12-30 DIAGNOSIS — I209 Angina pectoris, unspecified: Secondary | ICD-10-CM | POA: Insufficient documentation

## 2013-12-30 DIAGNOSIS — I208 Other forms of angina pectoris: Secondary | ICD-10-CM

## 2013-12-30 DIAGNOSIS — R079 Chest pain, unspecified: Secondary | ICD-10-CM | POA: Diagnosis not present

## 2013-12-30 DIAGNOSIS — R911 Solitary pulmonary nodule: Secondary | ICD-10-CM

## 2013-12-30 MED ORDER — PREDNISONE 20 MG PO TABS: ORAL_TABLET | ORAL | Status: DC

## 2013-12-30 NOTE — Assessment & Plan Note (Signed)
>>  ASSESSMENT AND PLAN FOR PULMONARY NODULE WRITTEN ON 12/30/2013  4:52 PM BY ARIDA, MUHAMMAD A, MD  This is being followed by Dr. Alaine.

## 2013-12-30 NOTE — Progress Notes (Signed)
Primary care physician: Dr. Caryn Section  HPI  This is a pleasant 48 year old female who was referred from the emergency room at [facility] for evaluation of chest pain. She has no previous cardiac history. She reports recurrent episodes of substernal chest tightness in the last 6-12 months. She was evaluated at [facility] 6 months ago and was told that stress test was normal. She was prescribed sublingual nitroglycerin and she noticed complete resolution of symptoms every time she takes nitroglycerin. She describes substernal tightness feeling with radiation to her neck. This can happen at rest and with physical activities. She has noticed also dyspnea with minimal activities. The chest pain response to nitroglycerin. She does have known history of GERD but reports control of symptoms with Prevacid. She has no history of diabetes or hypertension. She is a smoker and has COPD. There is strong family history of coronary artery disease. She also complains of palpitations and has been on propranolol for that reason. She had one episode of severe substernal chest pain which woke her up from sleep last week. This lasted for about half an hour. She went to the emergency room at [facility]. ECG showed anterior T wave changes but the rest of the workup was negative including negative troponin and chest x-ray. She had a previous echocardiogram in May 2014 which showed normal LV systolic function.  Allergies  Allergen Reactions  . Cefdinir     tachycardia  . Doxycycline     Nausea, migraine  . Ivp Dye [Iodinated Diagnostic Agents]     Heart palpitations     Current Outpatient Prescriptions on File Prior to Visit  Medication Sig Dispense Refill  . albuterol (PROVENTIL HFA;VENTOLIN HFA) 108 (90 BASE) MCG/ACT inhaler Inhale 2 puffs into the lungs every 6 (six) hours as needed for wheezing or shortness of breath.      Marland Kitchen albuterol (PROVENTIL) (2.5 MG/3ML) 0.083% nebulizer solution Take 2.5 mg by nebulization every 4 (four) hours as  needed for wheezing or shortness of breath.      . ALPRAZolam (XANAX) 1 MG tablet Take 1 mg by mouth 3 (three) times daily as needed for anxiety.      . ARIPiprazole (ABILIFY) 5 MG tablet Take 5 mg by mouth daily.      . clonazePAM (KLONOPIN) 0.5 MG tablet Take 0.5 mg by mouth 3 (three) times daily as needed for anxiety.      . DULoxetine (CYMBALTA) 60 MG capsule Take 60 mg by mouth daily.      . fluticasone-salmeterol (ADVAIR HFA) 115-21 MCG/ACT inhaler Inhale 2 puffs into the lungs 2 (two) times daily.  1 Inhaler  6  . hydroxychloroquine (PLAQUENIL) 200 MG tablet Take 400 mg by mouth daily.      . lansoprazole (PREVACID) 30 MG capsule Take 30 mg by mouth 2 (two) times daily before a meal.      . nitroGLYCERIN (NITROSTAT) 0.4 MG SL tablet Place 0.4 mg under the tongue every 5 (five) minutes as needed for chest pain.      Marland Kitchen tiotropium (SPIRIVA) 18 MCG inhalation capsule Place 18 mcg into inhaler and inhale daily.      . TraZODone HCl 150 MG TB24 Take 150 mg by mouth at bedtime as needed.       No current facility-administered medications on file prior to visit.     Past Medical History  Diagnosis Date  . Hypertension   . Emphysema lung   . Anxiety   . Panic attacks   . Depression   .  Lupus   . COPD (chronic obstructive pulmonary disease)   . Arrhythmia      Past Surgical History  Procedure Laterality Date  . Vaginal hysterectomy  1995     Family History  Problem Relation Age of Onset  . Emphysema Father   . Asthma Father   . Heart disease Father   . Cancer Father     colon  . Heart attack Father      History   Social History  . Marital Status: Single    Spouse Name: N/A    Number of Children: N/A  . Years of Education: N/A   Occupational History  . Not on file.   Social History Main Topics  . Smoking status: Current Every Day Smoker -- 1.00 packs/day for 16 years    Types: Cigarettes  . Smokeless tobacco: Never Used     Comment: down to .5ppd/11/18/13  .  Alcohol Use: No  . Drug Use: No  . Sexual Activity: Not on file   Other Topics Concern  . Not on file   Social History Narrative  . No narrative on file     ROS A 10 point review of system was performed. It is negative other than that mentioned in the history of present illness.   PHYSICAL EXAM   BP 110/80  Pulse 73  Ht 5\' 3"  (1.6 m)  Wt 155 lb 4 oz (70.421 kg)  BMI 27.51 kg/m2 Constitutional: She is oriented to person, place, and time. She appears well-developed and well-nourished. No distress.  HENT: No nasal discharge.  Head: Normocephalic and atraumatic.  Eyes: Pupils are equal and round. No discharge.  Neck: Normal range of motion. Neck supple. No JVD present. No thyromegaly present.  Cardiovascular: Normal rate, regular rhythm, normal heart sounds. Exam reveals no gallop and no friction rub. No murmur heard.  Pulmonary/Chest: Effort normal and breath sounds normal. No stridor. No respiratory distress. She has no wheezes. She has no rales. She exhibits no tenderness.  Abdominal: Soft. Bowel sounds are normal. She exhibits no distension. There is no tenderness. There is no rebound and no guarding.  Musculoskeletal: Normal range of motion. She exhibits no edema and no tenderness.  Neurological: She is alert and oriented to person, place, and time. Coordination normal.  Skin: Skin is warm and dry. No rash noted. She is not diaphoretic. No erythema. No pallor.  Psychiatric: She has a normal mood and affect. Her behavior is normal. Judgment and thought content normal.     IWP:YKDXI  Rhythm  -Prominent R(V1) -nonspecific. Septal T wave changes suggestive of ischemia.  BORDERLINE   ASSESSMENT AND PLAN

## 2013-12-30 NOTE — Patient Instructions (Addendum)
[facility] Cardiac Cath Instructions   You are scheduled for a Cardiac Cath on:_________________________  Please arrive at _______am on the day of your procedure  You will need to pre-register prior to the day of your procedure.  Enter through the [employer] at [facility].  Registration is the first desk on your right.  Please take the procedure order we have given you in order to be registered appropriately  Do not eat/drink anything after midnight  Someone will need to drive you home  It is recommended someone be with you for the first 24 hours after your procedure  Wear clothes that are easy to get on/off and wear slip on shoes if possible   Medications bring a current list of all medications with you  _x__ You may take all of your medications the morning of your procedure with enough water to swallow safely   Day of your procedure: Arrive at the [place] entrance.  Free valet service is available.  After entering the [place] please check-in at the registration desk (1st desk on your right) to receive your armband. After receiving your armband someone will escort you to the cardiac cath/special procedures waiting area.  The usual length of stay after your procedure is about 2 to 3 hours.  This can vary.  If you have any questions, please call our office at [phone], or you may call the cardiac cath lab at [facility] directly at [phone]  Your physician has recommended you make the following change in your medication:  Take Prednisone 40 mg (2 tablets) at 6 pm the night before cath, at bedtime the night before and the morning of

## 2013-12-30 NOTE — Assessment & Plan Note (Addendum)
The patient's symptoms are worrisome for class III angina with recent prolonged episode of chest pain at rest which is worrisome. She is already on a beta blocker. ECG did show T wave changes in the anterior wall suggestive of ischemia. Due to all of that, I recommend proceeding with cardiac catheterization and possible coronary intervention. Risks, benefits and alternatives were discussed with the patient. She's allergic to the IV dye and thus will pretreat with prednisone.

## 2013-12-30 NOTE — Assessment & Plan Note (Signed)
This is being followed by Dr. Lake Bells.

## 2013-12-30 NOTE — Telephone Encounter (Signed)
Patient instructed to arrive for her cath 01/03/14 at 1130 am  Patient verbalized understanding

## 2013-12-31 ENCOUNTER — Telehealth: Payer: Self-pay | Admitting: *Deleted

## 2013-12-31 LAB — PROTIME-INR
INR: 1 (ref 0.8–1.2)
Prothrombin Time: 10.5 s (ref 9.1–12.0)

## 2013-12-31 LAB — CBC WITH DIFFERENTIAL
BASOS: 1 %
Basophils Absolute: 0.1 10*3/uL (ref 0.0–0.2)
EOS ABS: 0.1 10*3/uL (ref 0.0–0.4)
Eos: 1 %
HEMATOCRIT: 44.4 % (ref 34.0–46.6)
Hemoglobin: 15.5 g/dL (ref 11.1–15.9)
Immature Grans (Abs): 0 10*3/uL (ref 0.0–0.1)
Immature Granulocytes: 0 %
Lymphocytes Absolute: 3.7 10*3/uL — ABNORMAL HIGH (ref 0.7–3.1)
Lymphs: 34 %
MCH: 29.9 pg (ref 26.6–33.0)
MCHC: 34.9 g/dL (ref 31.5–35.7)
MCV: 86 fL (ref 79–97)
MONOS ABS: 0.7 10*3/uL (ref 0.1–0.9)
Monocytes: 7 %
NEUTROS ABS: 6.1 10*3/uL (ref 1.4–7.0)
NEUTROS PCT: 57 %
Platelets: 242 10*3/uL (ref 150–379)
RBC: 5.19 x10E6/uL (ref 3.77–5.28)
RDW: 14.4 % (ref 12.3–15.4)
WBC: 10.7 10*3/uL (ref 3.4–10.8)

## 2013-12-31 LAB — BASIC METABOLIC PANEL
BUN/Creatinine Ratio: 19 (ref 9–23)
BUN: 16 mg/dL (ref 6–24)
CHLORIDE: 101 mmol/L (ref 97–108)
CO2: 23 mmol/L (ref 18–29)
Calcium: 9.8 mg/dL (ref 8.7–10.2)
Creatinine, Ser: 0.83 mg/dL (ref 0.57–1.00)
GFR calc Af Amer: 95 mL/min/{1.73_m2} (ref >59)
GFR calc non Af Amer: 82 mL/min/{1.73_m2} (ref >59)
GLUCOSE: 85 mg/dL (ref 65–99)
Potassium: 4.8 mmol/L (ref 3.5–5.2)
Sodium: 142 mmol/L (ref 134–144)

## 2013-12-31 NOTE — Telephone Encounter (Signed)
Faxed orders to cath lab

## 2014-01-03 ENCOUNTER — Ambulatory Visit: Payer: Self-pay | Admitting: Cardiovascular Disease

## 2014-01-03 DIAGNOSIS — Z800 Family history of malignant neoplasm of digestive organs: Secondary | ICD-10-CM | POA: Diagnosis not present

## 2014-01-03 DIAGNOSIS — F17200 Nicotine dependence, unspecified, uncomplicated: Secondary | ICD-10-CM | POA: Diagnosis not present

## 2014-01-03 DIAGNOSIS — I208 Other forms of angina pectoris: Secondary | ICD-10-CM

## 2014-01-03 DIAGNOSIS — M329 Systemic lupus erythematosus, unspecified: Secondary | ICD-10-CM | POA: Diagnosis not present

## 2014-01-03 DIAGNOSIS — Z91041 Radiographic dye allergy status: Secondary | ICD-10-CM | POA: Diagnosis not present

## 2014-01-03 DIAGNOSIS — J439 Emphysema, unspecified: Secondary | ICD-10-CM | POA: Diagnosis not present

## 2014-01-03 DIAGNOSIS — Z79899 Other long term (current) drug therapy: Secondary | ICD-10-CM | POA: Diagnosis not present

## 2014-01-03 DIAGNOSIS — Z825 Family history of asthma and other chronic lower respiratory diseases: Secondary | ICD-10-CM | POA: Diagnosis not present

## 2014-01-03 DIAGNOSIS — Z881 Allergy status to other antibiotic agents status: Secondary | ICD-10-CM | POA: Diagnosis not present

## 2014-01-03 DIAGNOSIS — I10 Essential (primary) hypertension: Secondary | ICD-10-CM | POA: Diagnosis not present

## 2014-01-03 DIAGNOSIS — R079 Chest pain, unspecified: Secondary | ICD-10-CM | POA: Diagnosis not present

## 2014-01-03 DIAGNOSIS — F419 Anxiety disorder, unspecified: Secondary | ICD-10-CM | POA: Diagnosis not present

## 2014-01-03 DIAGNOSIS — Z8249 Family history of ischemic heart disease and other diseases of the circulatory system: Secondary | ICD-10-CM | POA: Diagnosis not present

## 2014-01-03 DIAGNOSIS — Z888 Allergy status to other drugs, medicaments and biological substances status: Secondary | ICD-10-CM | POA: Diagnosis not present

## 2014-01-03 DIAGNOSIS — R0600 Dyspnea, unspecified: Secondary | ICD-10-CM | POA: Diagnosis not present

## 2014-01-03 DIAGNOSIS — Z7951 Long term (current) use of inhaled steroids: Secondary | ICD-10-CM | POA: Diagnosis not present

## 2014-01-03 HISTORY — PX: CARDIAC CATHETERIZATION: SHX172

## 2014-01-06 ENCOUNTER — Encounter: Payer: Self-pay | Admitting: *Deleted

## 2014-01-07 ENCOUNTER — Encounter: Payer: Self-pay | Admitting: Cardiovascular Disease

## 2014-01-17 ENCOUNTER — Encounter: Payer: Self-pay | Admitting: Cardiovascular Disease

## 2014-01-17 ENCOUNTER — Ambulatory Visit (INDEPENDENT_AMBULATORY_CARE_PROVIDER_SITE_OTHER): Payer: Medicare Other | Admitting: Cardiovascular Disease

## 2014-01-17 ENCOUNTER — Emergency Department: Payer: Self-pay | Admitting: Emergency Medicine

## 2014-01-17 VITALS — BP 100/80 | HR 84 | Ht 63.0 in | Wt 157.0 lb

## 2014-01-17 DIAGNOSIS — Z720 Tobacco use: Secondary | ICD-10-CM | POA: Diagnosis not present

## 2014-01-17 DIAGNOSIS — I208 Other forms of angina pectoris: Secondary | ICD-10-CM

## 2014-01-17 DIAGNOSIS — R079 Chest pain, unspecified: Secondary | ICD-10-CM | POA: Insufficient documentation

## 2014-01-17 DIAGNOSIS — R002 Palpitations: Secondary | ICD-10-CM | POA: Diagnosis not present

## 2014-01-17 DIAGNOSIS — J449 Chronic obstructive pulmonary disease, unspecified: Secondary | ICD-10-CM | POA: Diagnosis not present

## 2014-01-17 DIAGNOSIS — F419 Anxiety disorder, unspecified: Secondary | ICD-10-CM | POA: Diagnosis not present

## 2014-01-17 DIAGNOSIS — I10 Essential (primary) hypertension: Secondary | ICD-10-CM | POA: Diagnosis not present

## 2014-01-17 DIAGNOSIS — R0789 Other chest pain: Secondary | ICD-10-CM

## 2014-01-17 LAB — COMPREHENSIVE METABOLIC PANEL
ALK PHOS: 114 U/L
ALT: 26 U/L
ANION GAP: 6 — AB (ref 7–16)
AST: 18 U/L (ref 15–37)
Albumin: 3.6 g/dL (ref 3.4–5.0)
BILIRUBIN TOTAL: 0.3 mg/dL (ref 0.2–1.0)
BUN: 16 mg/dL (ref 7–18)
CO2: 29 mmol/L (ref 21–32)
CREATININE: 0.88 mg/dL (ref 0.60–1.30)
Calcium, Total: 8.9 mg/dL (ref 8.5–10.1)
Chloride: 107 mmol/L (ref 98–107)
EGFR (African American): >60
Glucose: 98 mg/dL (ref 65–99)
Potassium: 3.9 mmol/L (ref 3.5–5.1)
Sodium: 142 mmol/L (ref 136–145)
TOTAL PROTEIN: 7.8 g/dL (ref 6.4–8.2)

## 2014-01-17 LAB — URINALYSIS, COMPLETE
BILIRUBIN, UR: NEGATIVE
BLOOD: NEGATIVE
Bacteria: NONE SEEN
GLUCOSE, UR: NEGATIVE mg/dL (ref 0–75)
Ketone: NEGATIVE
NITRITE: NEGATIVE
PH: 5 (ref 4.5–8.0)
Protein: NEGATIVE
RBC,UR: 3 /HPF (ref 0–5)
SPECIFIC GRAVITY: 1.023 (ref 1.003–1.030)

## 2014-01-17 LAB — TROPONIN I: Troponin-I: <0.02 ng/mL

## 2014-01-17 LAB — CK TOTAL AND CKMB (NOT AT ARMC)
CK, Total: 36 U/L
CK-MB: <0.5 ng/mL — ABNORMAL LOW (ref 0.5–3.6)

## 2014-01-17 NOTE — Patient Instructions (Signed)
Your physician has recommended that you wear a holter monitor. Holter monitors are medical devices that record the heart's electrical activity. Doctors most often use these monitors to diagnose arrhythmias. Arrhythmias are problems with the speed or rhythm of the heartbeat. The monitor is a small, portable device. You can wear one while you do your normal daily activities. This is usually used to diagnose what is causing palpitations/syncope (passing out).  Your physician wants you to follow-up in: 6 months with Dr. Fletcher Anon. You will receive a reminder letter in the mail two months in advance. If you don't receive a letter, please call our office to schedule the follow-up appointment.  Your physician recommends that you continue on your current medications as directed. Please refer to the Current Medication list given to you today.

## 2014-01-17 NOTE — Assessment & Plan Note (Signed)
I requested a 48-hour Holter monitor for evaluation.

## 2014-01-17 NOTE — Progress Notes (Signed)
Primary care physician: Dr. Caryn Section  HPI  This is a pleasant 48 year old female who is here today for a follow-up visit regarding chest pain and recent cardiac catheterization. She has no previous cardiac history. She was seen for recurrent episodes of substernal chest tightness in the last 6-12 months. She was prescribed sublingual nitroglycerin and she noticed complete resolution of symptoms every time she takes nitroglycerin.  She does have known history of GERD but reports control of symptoms with Prevacid. She has no history of diabetes or hypertension. She is a smoker and has COPD. There is strong family history of coronary artery disease. ECG showed anterior T wave changes. She had a previous echocardiogram in May 2014 which showed normal LV systolic function. I proceeded with cardiac catheterization which showed minor irregularities, mildly elevated left ventricular end-diastolic pressure and normal ejection fraction. She continues to complain of chest pain mainly when she is having palpitations. She feels a pulsating sensation in her neck during these episodes.  Allergies  Allergen Reactions  . Cefdinir     tachycardia  . Doxycycline     Nausea, migraine  . Ivp Dye [Iodinated Diagnostic Agents]     Heart palpitations     Current Outpatient Prescriptions on File Prior to Visit  Medication Sig Dispense Refill  . albuterol (PROVENTIL HFA;VENTOLIN HFA) 108 (90 BASE) MCG/ACT inhaler Inhale 2 puffs into the lungs every 6 (six) hours as needed for wheezing or shortness of breath.    Marland Kitchen albuterol (PROVENTIL) (2.5 MG/3ML) 0.083% nebulizer solution Take 2.5 mg by nebulization every 4 (four) hours as needed for wheezing or shortness of breath.    . ALPRAZolam (XANAX) 1 MG tablet Take 1 mg by mouth 3 (three) times daily as needed for anxiety.    . ARIPiprazole (ABILIFY) 5 MG tablet Take 5 mg by mouth daily.    . clonazePAM (KLONOPIN) 0.5 MG tablet Take 0.5 mg by mouth 3 (three) times daily as  needed for anxiety.    . DULoxetine (CYMBALTA) 60 MG capsule Take 60 mg by mouth daily.    . fluticasone-salmeterol (ADVAIR HFA) 115-21 MCG/ACT inhaler Inhale 2 puffs into the lungs 2 (two) times daily. 1 Inhaler 6  . hydroxychloroquine (PLAQUENIL) 200 MG tablet Take 400 mg by mouth daily.    . lansoprazole (PREVACID) 30 MG capsule Take 30 mg by mouth 2 (two) times daily before a meal.    . nitroGLYCERIN (NITROSTAT) 0.4 MG SL tablet Place 0.4 mg under the tongue every 5 (five) minutes as needed for chest pain.    Marland Kitchen propranolol (INDERAL) 20 MG tablet Take 20 mg by mouth 2 (two) times daily.    Marland Kitchen tiotropium (SPIRIVA) 18 MCG inhalation capsule Place 18 mcg into inhaler and inhale daily.    . TraZODone HCl 150 MG TB24 Take 150 mg by mouth at bedtime as needed.     No current facility-administered medications on file prior to visit.     Past Medical History  Diagnosis Date  . Hypertension   . Emphysema lung   . Anxiety   . Panic attacks   . Depression   . Lupus   . COPD (chronic obstructive pulmonary disease)   . Arrhythmia      Past Surgical History  Procedure Laterality Date  . Vaginal hysterectomy  1995  . Cardiac catheterization       Family History  Problem Relation Age of Onset  . Emphysema Father   . Asthma Father   . Heart disease Father   .  Cancer Father     colon  . Heart attack Father      History   Social History  . Marital Status: Single    Spouse Name: N/A    Number of Children: N/A  . Years of Education: N/A   Occupational History  . Not on file.   Social History Main Topics  . Smoking status: Current Every Day Smoker -- 1.00 packs/day for 16 years    Types: Cigarettes  . Smokeless tobacco: Never Used     Comment: down to .5ppd/11/18/13  . Alcohol Use: No  . Drug Use: No  . Sexual Activity: Not on file   Other Topics Concern  . Not on file   Social History Narrative     ROS A 10 point review of system was performed. It is negative  other than that mentioned in the history of present illness.   PHYSICAL EXAM   BP 100/80 mmHg  Pulse 84  Ht 5\' 3"  (1.6 m)  Wt 157 lb (71.215 kg)  BMI 27.82 kg/m2 Constitutional: She is oriented to person, place, and time. She appears well-developed and well-nourished. No distress.  HENT: No nasal discharge.  Head: Normocephalic and atraumatic.  Eyes: Pupils are equal and round. No discharge.  Neck: Normal range of motion. Neck supple. No JVD present. No thyromegaly present.  Cardiovascular: Normal rate, regular rhythm, normal heart sounds. Exam reveals no gallop and no friction rub. No murmur heard.  Pulmonary/Chest: Effort normal and breath sounds normal. No stridor. No respiratory distress. She has no wheezes. She has no rales. She exhibits no tenderness.  Abdominal: Soft. Bowel sounds are normal. She exhibits no distension. There is no tenderness. There is no rebound and no guarding.  Musculoskeletal: Normal range of motion. She exhibits no edema and no tenderness.  Neurological: She is alert and oriented to person, place, and time. Coordination normal.  Skin: Skin is warm and dry. No rash noted. She is not diaphoretic. No erythema. No pallor.  Psychiatric: She has a normal mood and affect. Her behavior is normal. Judgment and thought content normal.  Right radial pulse is normal with no hematoma     ASSESSMENT AND PLAN

## 2014-01-17 NOTE — Assessment & Plan Note (Signed)
I discussed with her the importance of smoking cessation. 

## 2014-01-17 NOTE — Assessment & Plan Note (Signed)
Cardiac catheterization showed no evidence of obstructive coronary artery disease. The chest pain seems to correlate with symptoms of palpitations and tachycardia. Thus, it's important to exclude arrhythmia. Some of her symptoms are suggestive of paroxysmal supraventricular tachycardia.

## 2014-01-18 ENCOUNTER — Ambulatory Visit (INDEPENDENT_AMBULATORY_CARE_PROVIDER_SITE_OTHER): Payer: Medicare Other | Admitting: Pulmonary Disease

## 2014-01-18 ENCOUNTER — Encounter: Payer: Self-pay | Admitting: Pulmonary Disease

## 2014-01-18 ENCOUNTER — Telehealth: Payer: Self-pay | Admitting: Pulmonary Disease

## 2014-01-18 VITALS — BP 116/82 | HR 77 | Temp 98.0°F | Ht 62.0 in | Wt 156.0 lb

## 2014-01-18 DIAGNOSIS — I208 Other forms of angina pectoris: Secondary | ICD-10-CM | POA: Diagnosis not present

## 2014-01-18 DIAGNOSIS — J449 Chronic obstructive pulmonary disease, unspecified: Secondary | ICD-10-CM | POA: Diagnosis not present

## 2014-01-18 DIAGNOSIS — R05 Cough: Secondary | ICD-10-CM | POA: Diagnosis not present

## 2014-01-18 DIAGNOSIS — R059 Cough, unspecified: Secondary | ICD-10-CM

## 2014-01-18 MED ORDER — HYDROCOD POLST-CHLORPHEN POLST 10-8 MG/5ML PO LQCR: 5.0000 mL | Freq: Every evening | ORAL | Status: DC | PRN

## 2014-01-18 MED ORDER — FLUTTER DEVI: 1.0000 | Freq: Every day | Status: DC

## 2014-01-18 NOTE — Patient Instructions (Signed)
Take the tussionex at night for the cough, don't take this and drive.  No refills Use the flutter valve as needed for the thick mucus, you can blow on it 10 times 3-4 times per day Use mucinex to help get the thick mucus out Use delsym during the day for the cough Let us know if you have a fever (greater than 101.5), worsening shortness of breath We will see you back as previously scheduled QUIT SMOKING

## 2014-01-18 NOTE — Addendum Note (Signed)
Addended by: Maury Dus L on: 01/18/2014 11:03 AM   Modules accepted: Orders

## 2014-01-18 NOTE — Assessment & Plan Note (Signed)
She is describing increasing chest congestion but she has a normal lung exam, normal vitals, and a normal chest x-ray from last night in the ED. She may have a very mild upper respiratory infection but she does not have an exacerbation of COPD or even bronchitis.  Plan: -Supportive care -Tussionex as needed for cough at night no refills will be provided she was educated not to take this and drive and that it does contain a narcotic -Delsym during the day for cough -Flutter valve as needed -Mucinex -Followup as needed

## 2014-01-18 NOTE — Telephone Encounter (Signed)
Called and spoke with pt and she stated that BQ sent the order for her flutter device to the local pharmacy and they cannot fill this.  She stated that this will need to go to [facility] in [place].  The order has been placed and message sent to [facility]

## 2014-01-18 NOTE — Progress Notes (Signed)
Subjective:    Patient ID: Andrea [name], female    DOB: 02/21/1966, 48 y.o.   MRN: [id number]  Synopsis: Gold grade D. COPD, ongoing smoking as of May 2015  09/01/2011 Simple spirometry > ratio 62%, FEV1 1.09 L (39% pred) 05/2013 Alpha-1 > MM 06/03/2013 full pulmonary function testing [facility]>> ratio 63%, FEV1 1.66 L (70% predicted, 9% change with bronchodilator), total lung capacity 4.11 L (90% predicted), DLCO 7.1 (34% predicted) 06/2014 CT high res chest> mild centrilobular emphysema but no ILD, multiple scattered pulm nodules all 53mm in size January 2015 stress Echo [facility]: NORMAL STRESS TEST. NORMAL RESTING STUDY WITH NO WALL MOTION ABNORMALITIES AT REST AND PEAK STRESS. NO VALVULAR REGURGITATION NO VALVULAR STENOSIS Maximum workload of 7.00 METs was achieved during exercise. 07/2013 Echo > LVEF 55-60%, no pulmonary hypertension  HPI  01/18/2014 ROV > Andrea [name] has been coughing for the last three days and feels burning when she coughs.  She had fatigue and felt cold and hot yesterday and ended up going to the ED lately for chest pain.  She had a chest x-ray and was sent home stating that it was anxiety.  She has been using Spiriva, Advair, and albuterol daily.  She has been around a lot of grandkids with respiratory illness lately.  She has producing thick mucus lately, but it doesn't often come up.  Not taking mucinex.  Past Medical History  Diagnosis Date  . Hypertension   . Emphysema lung   . Anxiety   . Panic attacks   . Depression   . Lupus   . COPD (chronic obstructive pulmonary disease)   . Arrhythmia      Review of Systems  Constitutional: Positive for fatigue. Negative for fever and chills.  HENT: Negative for postnasal drip, rhinorrhea and sinus pressure.   Respiratory: Positive for cough, shortness of breath and wheezing.   Cardiovascular: Positive for chest pain. Negative for palpitations and leg swelling.       Objective:   Physical Exam  Filed Vitals:   01/18/14 1008  BP: 116/82  Pulse: 77  Temp: 98 F (36.7 C)  TempSrc: Oral  Height: 5\' 2"  (1.575 m)  Weight: 156 lb (70.761 kg)  SpO2: 97%  RA  Gen: well appearing, no acute distress HEENT: NCAT,  EOMi, OP clear, PULM: Clear to auscultation bilaterally CV: RRR, no mgr, no JVD AB: BS+, soft, nontender Ext: warm, no edema, no clubbing, no cyanosis Derm: no rash or skin breakdown      Assessment & Plan:   COPD, GOLD B She is describing increasing chest congestion but she has a normal lung exam, normal vitals, and a normal chest x-ray from last night in the ED. She may have a very mild upper respiratory infection but she does not have an exacerbation of COPD or even bronchitis.  Plan: -Supportive care -Tussionex as needed for cough at night no refills will be provided she was educated not to take this and drive and that it does contain a narcotic -Delsym during the day for cough -Flutter valve as needed -Mucinex -Followup as needed    Updated Medication List Outpatient Encounter Prescriptions as of 01/18/2014  Medication Sig  . albuterol (PROVENTIL HFA;VENTOLIN HFA) 108 (90 BASE) MCG/ACT inhaler Inhale 2 puffs into the lungs every 6 (six) hours as needed for wheezing or shortness of breath.  Marland Kitchen albuterol (PROVENTIL) (2.5 MG/3ML) 0.083% nebulizer solution Take 2.5 mg by nebulization every 4 (four) hours as needed for wheezing or shortness  of breath.  . ALPRAZolam (XANAX) 1 MG tablet Take 1 mg by mouth 3 (three) times daily as needed for anxiety.  . ARIPiprazole (ABILIFY) 5 MG tablet Take 5 mg by mouth daily.  . clonazePAM (KLONOPIN) 0.5 MG tablet Take 0.5 mg by mouth 3 (three) times daily as needed for anxiety.  . DULoxetine (CYMBALTA) 60 MG capsule Take 60 mg by mouth daily.  . fluticasone-salmeterol (ADVAIR HFA) 115-21 MCG/ACT inhaler Inhale 2 puffs into the lungs 2 (two) times daily.  . hydroxychloroquine (PLAQUENIL) 200 MG tablet Take 400 mg by mouth daily.  . lansoprazole  (PREVACID) 30 MG capsule Take 30 mg by mouth 2 (two) times daily before a meal.  . nitroGLYCERIN (NITROSTAT) 0.4 MG SL tablet Place 0.4 mg under the tongue every 5 (five) minutes as needed for chest pain.  Marland Kitchen propranolol (INDERAL) 20 MG tablet Take 20 mg by mouth 2 (two) times daily.  Marland Kitchen tiotropium (SPIRIVA) 18 MCG inhalation capsule Place 18 mcg into inhaler and inhale daily.  . TraZODone HCl 150 MG TB24 Take 150 mg by mouth at bedtime as needed.  . chlorpheniramine-HYDROcodone (TUSSIONEX PENNKINETIC ER) 10-8 MG/5ML LQCR Take 5 mLs by mouth at bedtime as needed for cough.

## 2014-01-19 ENCOUNTER — Telehealth: Payer: Self-pay | Admitting: Pulmonary Disease

## 2014-01-19 NOTE — Telephone Encounter (Signed)
Pt returning call.Andrea [name] ° °

## 2014-01-19 NOTE — Telephone Encounter (Signed)
Spoke with pt--aware of rec's per BQ Nothing further needed.

## 2014-01-19 NOTE — Telephone Encounter (Signed)
Per BQ: pt's lungs looked clear on cxr, does not need an abx.  If she is having a fever, she needs to be seen by BQ or PCP.

## 2014-01-19 NOTE — Telephone Encounter (Signed)
LM for pt to return call x 1 

## 2014-01-19 NOTE — Telephone Encounter (Signed)
Pt reports that she woke up hoarse this morning and had chest congestion. Pt reports that mucus is yellow, cough is increased and she is having hot/cold sweats. Pt does not want to go back into hospital-- feels this is starting the same as previous "colds"  Wanting to know if she needs to start an abx? Please advise Dr Lake Bells. Thanks.  Allergies  Allergen Reactions  . Cefdinir     tachycardia  . Doxycycline     Nausea, migraine  . Ivp Dye [Iodinated Diagnostic Agents]     Heart palpitations

## 2014-02-18 DIAGNOSIS — J441 Chronic obstructive pulmonary disease with (acute) exacerbation: Secondary | ICD-10-CM | POA: Diagnosis not present

## 2014-02-23 ENCOUNTER — Ambulatory Visit: Payer: Self-pay | Admitting: Family Medicine

## 2014-02-23 DIAGNOSIS — R05 Cough: Secondary | ICD-10-CM | POA: Diagnosis not present

## 2014-03-18 ENCOUNTER — Emergency Department: Payer: Self-pay | Admitting: Student

## 2014-03-18 DIAGNOSIS — Z720 Tobacco use: Secondary | ICD-10-CM | POA: Diagnosis not present

## 2014-03-18 DIAGNOSIS — F17200 Nicotine dependence, unspecified, uncomplicated: Secondary | ICD-10-CM | POA: Diagnosis not present

## 2014-03-18 DIAGNOSIS — J449 Chronic obstructive pulmonary disease, unspecified: Secondary | ICD-10-CM | POA: Diagnosis not present

## 2014-03-18 DIAGNOSIS — R002 Palpitations: Secondary | ICD-10-CM | POA: Diagnosis not present

## 2014-03-18 LAB — BASIC METABOLIC PANEL
ANION GAP: 3 — AB (ref 7–16)
BUN: 10 mg/dL (ref 7–18)
CHLORIDE: 106 mmol/L (ref 98–107)
CREATININE: 0.86 mg/dL (ref 0.60–1.30)
Calcium, Total: 9.5 mg/dL (ref 8.5–10.1)
Co2: 34 mmol/L — ABNORMAL HIGH (ref 21–32)
EGFR (African American): >60
EGFR (Non-African Amer.): >60
Glucose: 106 mg/dL — ABNORMAL HIGH (ref 65–99)
Osmolality: 284 (ref 275–301)
POTASSIUM: 4.2 mmol/L (ref 3.5–5.1)
Sodium: 143 mmol/L (ref 136–145)

## 2014-03-18 LAB — CBC
HCT: 47.8 % — ABNORMAL HIGH (ref 35.0–47.0)
HGB: 15.9 g/dL (ref 12.0–16.0)
MCH: 29.3 pg (ref 26.0–34.0)
MCHC: 33.3 g/dL (ref 32.0–36.0)
MCV: 88 fL (ref 80–100)
Platelet: 262 10*3/uL (ref 150–440)
RBC: 5.44 10*6/uL — ABNORMAL HIGH (ref 3.80–5.20)
RDW: 14.8 % — AB (ref 11.5–14.5)
WBC: 8.2 10*3/uL (ref 3.6–11.0)

## 2014-03-18 LAB — TROPONIN I: Troponin-I: <0.02 ng/mL

## 2014-03-24 DIAGNOSIS — R0789 Other chest pain: Secondary | ICD-10-CM | POA: Diagnosis not present

## 2014-05-05 ENCOUNTER — Telehealth: Payer: Self-pay | Admitting: *Deleted

## 2014-05-05 NOTE — Telephone Encounter (Signed)
Left message on voicemail to see if patient wore holter monitor.

## 2014-05-25 ENCOUNTER — Encounter: Payer: Self-pay | Admitting: Pulmonary Disease

## 2014-05-25 ENCOUNTER — Ambulatory Visit (INDEPENDENT_AMBULATORY_CARE_PROVIDER_SITE_OTHER): Payer: Medicare Other | Admitting: Pulmonary Disease

## 2014-05-25 VITALS — BP 116/70 | HR 81 | Ht 62.0 in | Wt 157.0 lb

## 2014-05-25 DIAGNOSIS — Z720 Tobacco use: Secondary | ICD-10-CM

## 2014-05-25 DIAGNOSIS — R911 Solitary pulmonary nodule: Secondary | ICD-10-CM

## 2014-05-25 DIAGNOSIS — J449 Chronic obstructive pulmonary disease, unspecified: Secondary | ICD-10-CM | POA: Diagnosis not present

## 2014-05-25 NOTE — Assessment & Plan Note (Signed)
We will plan on performing a CT scan of the chest in May 2016 to follow-up her pulmonary nodule. If this is normal then she should have annual screening CT scans starting at age 49.

## 2014-05-25 NOTE — Assessment & Plan Note (Signed)
>>  ASSESSMENT AND PLAN FOR PULMONARY NODULE WRITTEN ON 05/25/2014  5:13 PM BY MCQUAID, DOUGLAS B, MD  We will plan on performing a CT scan of the chest in May 2016 to follow-up her pulmonary nodule. If this is normal then she should have annual screening CT scans starting at age 49.

## 2014-05-25 NOTE — Progress Notes (Signed)
Subjective:    Patient ID: Andrea [name], female    DOB: 1965-11-24, 49 y.o.   MRN: [id number]  Synopsis: Gold grade B. COPD, ongoing smoking as of May 2015  09/01/2011 Simple spirometry > ratio 62%, FEV1 1.09 L (39% pred) 05/2013 Alpha-1 > MM 06/03/2013 full pulmonary function testing Andrea [name]>> ratio 63%, FEV1 1.66 L (70% predicted, 9% change with bronchodilator), total lung capacity 4.11 L (90% predicted), DLCO 7.1 (34% predicted) 06/2014 CT high res chest> mild centrilobular emphysema but no ILD, multiple scattered pulm nodules all 75mm in size January 2015 stress [facility]: NORMAL STRESS TEST. NORMAL RESTING STUDY WITH NO WALL MOTION ABNORMALITIES AT REST AND PEAK STRESS. NO VALVULAR REGURGITATION NO VALVULAR STENOSIS Maximum workload of 7.00 METs was achieved during exercise. 07/2013 Echo > LVEF 55-60%, no pulmonary hypertension  HPI Chief Complaint  Patient presents with  . Follow-up    Pt has good and bad days, c/o prod cough with clear mucus, sob with exertion.      Andrea [name] has been back to the ED with chest pain and dyspnea 2-3 times in the last few months.  She had a LHC with minimal irregularities.  She has not been able to quit smoking.  She is still smoking 1/2 pack per day. She says that she wheezes more when she lies flat.  She has been taking prevacid and has had an endoscopy which was normal.    Past Medical History  Diagnosis Date  . Hypertension   . Emphysema lung   . Anxiety   . Panic attacks   . Depression   . Lupus   . COPD (chronic obstructive pulmonary disease)   . Arrhythmia      Review of Systems  Constitutional: Positive for fatigue. Negative for fever and chills.  HENT: Negative for postnasal drip, rhinorrhea and sinus pressure.   Respiratory: Positive for cough, shortness of breath and wheezing.   Cardiovascular: Positive for chest pain. Negative for palpitations and leg swelling.       Objective:   Physical Exam Filed Vitals:   05/25/14 1622   BP: 116/70  Pulse: 81  Height: 5\' 2"  (1.575 m)  Weight: 157 lb (71.215 kg)  SpO2: 100%  RA  Gen: well appearing, no acute distress HEENT: NCAT,  EOMi, OP clear, PULM: Clear to auscultation bilaterally CV: RRR, no mgr, no JVD AB: BS+, soft, nontender Ext: warm, no edema, no clubbing, no cyanosis Derm: no rash or skin breakdown      Assessment & Plan:   COPD, GOLD B Andrea [name] continues to struggle with anxiety complicating her COPD. Her airflow obstruction is only moderate and she is not wheezing on exam today. I believe that much of her symptoms can be explained primarily by anxiety, but considering her emphysema and moderate airflow obstruction we cannot more her COPD. Today I spent a significant amount of time with her trying to educate her on vocal cord dysfunction wheezing which is primarily an upper airway process which occurs with anxiety versus COPD related shortness of breath. I explained that typically COPD exacerbations will occur in the setting of an acute respiratory infection with chest tightness, mucus production and shortness of breath.  Plan: -Continue Spiriva and Advair, but could consider stopping Advair on the next visit -Try voice exercises when she has wheezing in the evenings which are likely caused by vocal cord dysfunction Follow-up 6 months   Tobacco abuse Counseled at length to quit today.   Pulmonary nodule We will  plan on performing a CT scan of the chest in May 2016 to follow-up her pulmonary nodule. If this is normal then she should have annual screening CT scans starting at age 56.     Updated Medication List Outpatient Encounter Prescriptions as of 05/25/2014  Medication Sig  . albuterol (PROVENTIL HFA;VENTOLIN HFA) 108 (90 BASE) MCG/ACT inhaler Inhale 2 puffs into the lungs every 6 (six) hours as needed for wheezing or shortness of breath.  Marland Kitchen albuterol (PROVENTIL) (2.5 MG/3ML) 0.083% nebulizer solution Take 2.5 mg by nebulization every 4 (four)  hours as needed for wheezing or shortness of breath.  . ALPRAZolam (XANAX) 1 MG tablet Take 1 mg by mouth 3 (three) times daily as needed for anxiety.  . ARIPiprazole (ABILIFY) 5 MG tablet Take 5 mg by mouth daily.  . clonazePAM (KLONOPIN) 0.5 MG tablet Take 0.5 mg by mouth 3 (three) times daily as needed for anxiety.  . DULoxetine (CYMBALTA) 60 MG capsule Take 60 mg by mouth daily.  . fluticasone-salmeterol (ADVAIR HFA) 115-21 MCG/ACT inhaler Inhale 2 puffs into the lungs 2 (two) times daily.  . hydroxychloroquine (PLAQUENIL) 200 MG tablet Take 400 mg by mouth daily.  . lansoprazole (PREVACID) 30 MG capsule Take 30 mg by mouth 2 (two) times daily before a meal.  . nitroGLYCERIN (NITROSTAT) 0.4 MG SL tablet Place 0.4 mg under the tongue every 5 (five) minutes as needed for chest pain.  Marland Kitchen propranolol (INDERAL) 20 MG tablet Take 20 mg by mouth 2 (two) times daily.  Marland Kitchen Respiratory Therapy Supplies (FLUTTER) DEVI 1 Device by Does not apply route daily.  Marland Kitchen tiotropium (SPIRIVA) 18 MCG inhalation capsule Place 18 mcg into inhaler and inhale daily.  . TraZODone HCl 150 MG TB24 Take 150 mg by mouth at bedtime as needed.  . [DISCONTINUED] chlorpheniramine-HYDROcodone (TUSSIONEX PENNKINETIC ER) 10-8 MG/5ML LQCR Take 5 mLs by mouth at bedtime as needed for cough. (Patient not taking: Reported on 05/25/2014)

## 2014-05-25 NOTE — Patient Instructions (Signed)
Try to relax and breathe slowly and use the voice exercises I showed you when you wheeze at night Keep taking the inhalers as you are doing We will call you with the result of the CT scan  We will see you back in 6 months or sooner if needed

## 2014-05-25 NOTE — Assessment & Plan Note (Addendum)
Counseled at length to quit today.  

## 2014-05-25 NOTE — Assessment & Plan Note (Signed)
Andrea [name] continues to struggle with anxiety complicating her COPD. Her airflow obstruction is only moderate and she is not wheezing on exam today. I believe that much of her symptoms can be explained primarily by anxiety, but considering her emphysema and moderate airflow obstruction we cannot more her COPD. Today I spent a significant amount of time with her trying to educate her on vocal cord dysfunction wheezing which is primarily an upper airway process which occurs with anxiety versus COPD related shortness of breath. I explained that typically COPD exacerbations will occur in the setting of an acute respiratory infection with chest tightness, mucus production and shortness of breath.  Plan: -Continue Spiriva and Advair, but could consider stopping Advair on the next visit -Try voice exercises when she has wheezing in the evenings which are likely caused by vocal cord dysfunction Follow-up 6 months

## 2014-05-26 ENCOUNTER — Other Ambulatory Visit: Payer: Self-pay | Admitting: *Deleted

## 2014-05-26 MED ORDER — FLUTICASONE-SALMETEROL 115-21 MCG/ACT IN AERO: 2.0000 | INHALATION_SPRAY | Freq: Two times a day (BID) | RESPIRATORY_TRACT | Status: DC

## 2014-06-19 ENCOUNTER — Emergency Department: Admit: 2014-06-19 | Disposition: A | Discharge status: Home / Self Care | Payer: Self-pay | Admitting: Internal Medicine

## 2014-06-19 DIAGNOSIS — R05 Cough: Secondary | ICD-10-CM | POA: Diagnosis not present

## 2014-06-19 DIAGNOSIS — J441 Chronic obstructive pulmonary disease with (acute) exacerbation: Secondary | ICD-10-CM | POA: Diagnosis not present

## 2014-06-19 DIAGNOSIS — J019 Acute sinusitis, unspecified: Secondary | ICD-10-CM | POA: Diagnosis not present

## 2014-06-19 DIAGNOSIS — I10 Essential (primary) hypertension: Secondary | ICD-10-CM | POA: Diagnosis not present

## 2014-06-19 DIAGNOSIS — J45909 Unspecified asthma, uncomplicated: Secondary | ICD-10-CM | POA: Diagnosis not present

## 2014-06-19 DIAGNOSIS — Z720 Tobacco use: Secondary | ICD-10-CM | POA: Diagnosis not present

## 2014-06-19 DIAGNOSIS — F419 Anxiety disorder, unspecified: Secondary | ICD-10-CM | POA: Diagnosis not present

## 2014-06-19 DIAGNOSIS — Z79899 Other long term (current) drug therapy: Secondary | ICD-10-CM | POA: Diagnosis not present

## 2014-06-19 DIAGNOSIS — R0602 Shortness of breath: Secondary | ICD-10-CM | POA: Diagnosis not present

## 2014-06-19 LAB — BASIC METABOLIC PANEL
ANION GAP: 8 (ref 7–16)
BUN: 14 mg/dL
CREATININE: 0.75 mg/dL
Calcium, Total: 8.9 mg/dL
Chloride: 102 mmol/L
Co2: 25 mmol/L
Glucose: 113 mg/dL — ABNORMAL HIGH
POTASSIUM: 3.8 mmol/L
SODIUM: 135 mmol/L

## 2014-06-19 LAB — CBC
HCT: 44.5 % (ref 35.0–47.0)
HGB: 15.1 g/dL (ref 12.0–16.0)
MCH: 28.9 pg (ref 26.0–34.0)
MCHC: 34.1 g/dL (ref 32.0–36.0)
MCV: 85 fL (ref 80–100)
Platelet: 174 10*3/uL (ref 150–440)
RBC: 5.25 10*6/uL — ABNORMAL HIGH (ref 3.80–5.20)
RDW: 14.1 % (ref 11.5–14.5)
WBC: 11.4 10*3/uL — ABNORMAL HIGH (ref 3.6–11.0)

## 2014-06-19 LAB — TROPONIN I: Troponin-I: <0.03 ng/mL

## 2014-06-24 DIAGNOSIS — J441 Chronic obstructive pulmonary disease with (acute) exacerbation: Secondary | ICD-10-CM | POA: Diagnosis not present

## 2014-06-24 NOTE — H&P (Signed)
PATIENT NAME:  Andrea [name], Andrea [name] MR#:  [id number] DATE OF BIRTH:  [birth year]  DATE OF ADMISSION:  07/23/2012  PRIMARY CARE PHYSICIAN: Dr. Tobe Sos at [facility] in the [place]: Shortness of breath, cough, congestion for about 4 to 5 days.   HISTORY OF PRESENT ILLNESS: Andrea [name] is a pleasant 49 year old Caucasian female with history of panic attacks/anxiety, GERD, fibromyalgia and history of lupus who has history of heavy tobacco abuse for many years, comes in with increasing shortness of breath, cough, congestion and low-grade fever in the early part of the week. She was found to have COPD exacerbation, which is a new diagnosis with her, along with symptoms suggestive of acute bronchitis. The patient was ambulated in the Emergency Room after she received Solu-Medrol nebulizer treatment. Her saturations dropped down from 93 on room air at rest to in the upper 70s on ambulation, per ER physician. She is being admitted for further evaluation and management. The patient denies any chest pain.   PAST MEDICAL HISTORY: 1.  Panic attacks.  2.  GERD.  3.  Fibromyalgia.  4.  History of lupus.   ALLERGIES: IVP DYE AND OMNICEF.   MEDICATIONS: 1.  Trazodone 5 mg daily.  2.  Carafate 1 gram orally t.i.d. p.r.n.  3.  Clobetasol 0.05 topical cream to affected area 4 times a day.  4.  Clonazepam 0.5 mg p.o. b.i.d.  5.  Cymbalta 30 mg 2 capsules in the morning and 1 capsule at night.  6.  Folic acid 1 mg b.i.d.  7.  Hydroxychloroquine 200 mg b.i.d.  8.  Prevacid 20 mg b.i.d.  9.  Propranolol 20 mg b.i.d.  10.  Trazodone 50 mg p.o. at bedtime.  11.  Sertraline 50 mg p.o. daily. The patient tells me she does not take sertraline anymore.   FAMILY HISTORY: Positive for MI in father at age 81.   SOCIAL HISTORY: She smokes about a pack a day for many years.  Nonalcoholic. Denies any other drug use.   REVIEW OF SYSTEMS:   CONSTITUTIONAL: Positive for fatigue, weakness.   EYES: No blurred or double vision, no glaucoma.  ENT: Positive for cough, congestion and postnasal drip.  RESPIRATORY: Positive for cough, shortness of breath.  CARDIOVASCULAR: No chest pain. Positive for shortness of breath on dyspnea. No palpitations or syncope.  GASTROINTESTINAL: No nausea, vomiting, diarrhea or abdominal pain.  GENITOURINARY: No dysuria, hematuria or frequency.  ENDOCRINE: No polyuria, nocturia or thyroid problems.  SKIN: No acne or rash.  MUSCULOSKELETAL: Positive for back pain.  NEUROLOGIC: No CVA or TIA.  PSYCHIATRIC: Positive for anxiety and panics.   PHYSICAL EXAMINATION: GENERAL: The patient is awake, alert, oriented x 3, not in acute distress.  VITAL SIGNS: She is afebrile, pulse is 97, blood pressure is 130/64, sats are 95% on room air, drops down to the 70s with exertion.  HEENT: Atraumatic, normocephalic. PERRLA.  EOM intact. Oral mucosa is dry.  She has some cracked and dry lips.  NECK: Supple. No JVD. No carotid bruit.  RESPIRATORY: Coarse breath sounds bilaterally. No audible rales or rhonchi.  EXTREMITIES: Good pedal pulses, good femoral pulses. No lower extremity edema.  CARDIOVASCULAR: Both the heart sounds are normal. No murmur heard. Mild tachycardia.Marland Kitchen PMI not lateralized.  EXTREMITIES: Good pedal pulses, good femoral pulses. No lower extremity edema.  ABDOMEN: Soft, benign, nontender. No organomegaly. Positive bowel sounds.  NEUROLOGIC: Grossly intact cranial nerves II through XII. No motor or sensory deficits.  PSYCHIATRIC: The patient is awake, alert, oriented x3, somewhat anxious.   LABORATORY AND RADIOLOGICAL DATA:  EKG shows normal sinus rhythm with flipped T waves in V1 to V3. This is new from the last EKG available from October 2013.  Chest x-ray consistent with changes of COPD.  No obvious infiltrate.  White count is 12.2, H and H  is 15 and 44.4, platelet count is 220. Glucose is 89, BUN is 12, creatinine 0.6, sodium 138, potassium 3.9,  chloride is 103, bicarbonate is 31, calcium is 9.5.   The  first set of cardiac enzymes are negative. B-type natriuretic peptide is 46, d-dimer is less than 0.22.   ASSESSMENT AND PLAN: Andrea [name] is a 49 year old with history of ongoing heavy tobacco abuse, panic attacks and history of GERD, comes into the Emergency Room with increasing shortness of breath, hypoxia on exertion and cough, congestion, admitted with:   1.  Acute chronic obstructive pulmonary disease flare with hypoxemia on exertion:  The patient is a heavy smoker. We will admit patient for overnight observation. Regular diet. IV Solu-Medrol oral inhalers, p.r.n. nebs and empiric antibiotic for acute bronchitis. Follow-up cultures.  Assess home oxygen need prior to discharge.  2.  Acute bronchitis: We will start the patient on Levaquin, check sputum cultures. The patient is able to give sputum sample.  3.  Panic attacks, anxiety:  Sees Dr. Gretel Acre, psychiatry.  Continue her Abilify and Clonazepam and trazodone.  4.  History of lupus:  Continue hydroxychloroquine and folic acid.  5.  Abnormal EKG:  The patient showed flipped T waves in V1 to V3 which is new from October 2013. She denies any chest pain. She has a strong family history of MI. We will check echo of the heart and cycle cardiac enzymes x 2.  First set of cardiac enzymes is negative, and if cardiac enzymes remain normal and echo is stable, consider work-up as outpatient.  6.  Tobacco abuse:  The patient was advised cessation, about 3 minutes spent. The patient is agreeable.  7.  Further work-up according to the patient's clinical course.   Admission plan was discussed with patient, who agrees to it.        TIME SPENT: 50 minutes.  ____________________________ Hart Rochester Posey Pronto, MD sap:cb D: 07/23/2012 49:44:96 ET T: 07/23/2012 20:42:53 ET JOB#: [id number]  cc: Austan Nicholl A. Posey Pronto, MD, <Dictator> [facility], Dr. Dwain Sarna MD ELECTRONICALLY SIGNED  07/30/2012 14:50

## 2014-06-24 NOTE — Discharge Summary (Signed)
PATIENT NAME:  Andrea [name], Andrea [name] MR#:  [id number] DATE OF BIRTH:  June 16, 1965  DATE OF ADMISSION:  07/25/2012 DATE OF DISCHARGE:  07/25/2012  ADMITTING DIAGNOSIS:  Shortness of breath, cough.   DISCHARGE DIAGNOSES: 1.  Shortness of breath and cough, likely due to acute bronchitis with bronchospasm, also possible new onset chronic obstructive pulmonary disease.  2.  History of panic attacks and anxiety disorder.  3.  Gastroesophageal reflux disease.  4.  Fibromyalgia.  5.  History of lupus.  6.  Abnormal EKG with T wave inversions in lead V1 to V3, new compared to October 2013.  No chest pains.  Cardiac enzymes negative.  Echocardiogram is normal.  Will have her follow up as an outpatient for cardiac stress test.   CONSULTANTS:  None.   PERTINENT LABORATORY DATA AND EVALUATIONS:  EKG on admission showed ST-T wave abnormalities as described above.  PA and lateral chest x-ray showed no acute cardiopulmonary processes.  D-dimer was less than 0.22.  UA with nitrites negative, leukocytes negative.  Blood cultures, no growth at 36 hours.  BNP was 46.  Troponin less than 0.02.  Glucose 89, BUN 12, creatinine 0.65, sodium 138, potassium 3.9, chloride 103, CO2 was 31.  WBC 12.2, hemoglobin 15, platelet count was 220.  Troponin x 3 showed less than 0.02.  Echocardiogram showed a normal EF, with no significant wall motion abnormality.   HOSPITAL COURSE:  Please refer to H and P done by the admitting physician.  The patient is a 49 year old white female with history of panic attacks, anxiety, GERD, fibromyalgia, history of lupus, has a history of heavy tobacco abuse for many years, presented with shortness of breath and cough and congestion for the past 4 to 5 days.  She also had low-grade fevers.  She was seen in the ED.  Initially her saturations were noted in the 70s with ambulation.  We were asked to admit the patient for possible chronic obstructive pulmonary disease exacerbation.  The patient has no history  of chronic obstructive pulmonary disease, however her clinical picture was thought to be consistent with new onset chronic obstructive pulmonary disease.  She was admitted.  Also, she was thought to have acute bronchitis.  She was treated with IV antibiotics and nebulizers and IV steroids.  She started to improve.  She is doing much better.  Her breathing is improved.  She was ambulated and her saturations stayed above 90.  She is requesting to go home.  She will need to have further work-up for her EKG abnormalities as well as further pulmonary follow-up.  The patient was strongly recommended to stop smoking.  She is agreeable.  She did not want a nicotine patch.   DISCHARGE MEDICATIONS:  Propranolol 20 1 tab by mouth twice daily, Carafate 1 gram 3 times a day, folic acid 1 mg by mouth twice daily, hydroxychloroquine 20 1 tab by mouth twice daily, Prevacid 30 1 tab by mouth twice daily, trazodone 150 at bedtime, Cymbalta 30 mg two caps in the morning, one cap at nighttime, sertraline 50 at bedtime,  daily, clonazepam 0.5 mg 1 tab by mouth twice daily, clobetasol topically apply to affected area 4 times a day, Tylenol 650 mg q. 4 as needed for temperature or pain, Advair 250/50 1 puff twice daily, guaifenesin long-acting 600 mg 1 tab by mouth twice daily, levofloxacin 500 1 tab by mouth daily for 4 more days, prednisone taper starting at 60 mg, taper by 10 mg until complete, Combivent 1  puff 4 times per day as needed for shortness of breath, wheezing.   FOLLOW-UP:  With primary M.D. at [facility] as she was doing previously in 1 to 2 weeks.  Follow up with Dr. Raul Del as a new patient for possible chronic obstructive pulmonary disease and PFT evaluation.  Follow up with [facility] for abnormal EKG and a stress test.   TIME SPENT:  35 minutes spent on the discharge.     ____________________________ Lafonda Mosses. Posey Pronto, MD shp:ea D: 07/25/2012 14:53:58 ET T: 07/26/2012 01:56:08  ET JOB#: [id number]  cc: Faithlyn Recktenwald H. Posey Pronto, MD, <Dictator> Alric Seton MD ELECTRONICALLY SIGNED 07/30/2012 14:52

## 2014-06-25 NOTE — Op Note (Signed)
PATIENT NAME:  Andrea [name], Andrea [name] MR#:  [id number] DATE OF BIRTH:  03/15/1965  DATE OF PROCEDURE:  10/28/2013  PREOPERATIVE DIAGNOSIS: Chronic cholecystitis and cholelithiasis.   POSTOPERATIVE DIAGNOSIS: Chronic cholecystitis and cholelithiasis.   OPERATION: Robotic-assisted laparoscopic cholecystectomy.   SURGEON: Rodena Goldmann III, MD.   ANESTHESIA: General.   OPERATIVE PROCEDURE: With the patient in the supine position after the induction of appropriate general anesthesia, the patient's abdomen was prepped with ChloraPrep and draped with sterile towels. The patient was head down, feet up position. A small infraumbilical incision was made in the standard fashion, carried down bluntly through subcutaneous tissue. A Veress needle was used to cannulate the peritoneal cavity. CO2 was insufflated to appropriate pressure measurements. When approximately 2.5 liters of CO2 were instilled, the Veress needle was withdrawn. An 11 mm [employer] port was inserted into the peritoneal cavity. Intraperitoneal position was confirmed. CO2 was reinsufflated. The patient was placed in the head up, feet down position and rotated slightly to the left side. Two lateral robotic ports were inserted under direct vision. A right upper quadrant assistant port was inserted under direct vision. The robot was brought onto the table, appropriately docked to the patient, instruments inserted under direct vision. I then moved to the console. The gallbladder was retracted superiorly and laterally, exposing the cystic duct, the cystic artery, and hepatoduodenal ligament. Careful dissection exposed the cystic duct, which appeared to be a long cystic duct. The common duct was visualized. The cystic duct was doubly clipped on the common duct side, singly clipped on the gallbladder and divided. The artery was doubly clipped and divided. The gallbladder was then dissected free from its bed and liver using hook and cautery apparatus. There  was a small tear in the dome of the gallbladder which did not extravasate any stones, but did extravasate some bile. The gallbladder was then pinned in the right upper quadrant while the abdomen was carefully irrigated with several liters of warm saline solution. The gallbladder was then retrieved with an Endo Catch apparatus in the umbilical port. The abdomen was then desufflated. All ports were withdrawn without difficulty. Skin incisions were closed with 5-0 nylon. The area was infiltrated with 0.25% Marcaine for postoperative pain control. Sterile dressings were applied. The patient was returned to the recovery room having tolerated the procedure well.    ____________________________ Micheline Maze, MD rle:at D: 10/28/2013 08:53:38 ET T: 10/28/2013 12:46:50 ET JOB#: [id number]  cc: Rodena Goldmann III, MD, <Dictator> Rodena Goldmann MD ELECTRONICALLY SIGNED 10/28/2013 17:30

## 2014-06-25 NOTE — Consult Note (Signed)
PATIENT NAME:  Andrea [name], Andrea [name] MR#:  [id number] DATE OF BIRTH:  1965/05/17  DATE OF CONSULTATION:  03/10/2013  CONSULTING PHYSICIAN:  Chrysa Rampy R. Witt Plitt, MD  PRIMARY CARE PHYSICIAN:  Dr. Caryn Section.  CHIEF COMPLAINT: Chest pain.   HISTORY OF PRESENT ILLNESS: A 49 year old Caucasian female patient with history of COPD, lupus, fibromyalgia, who continues to smoke, and has premature coronary artery disease in the family, presents to the hospital, sent in by primary care physician after she woke up from her sleep with some chest pain. She mentions that this lasted about 3 to 4 minutes, presently is better, but does tend to come back when she takes deep breath or coughs. This is all over her chest. She feels like she is coming down with bronchitis episode she had earlier. Has some trouble breathing. Has had wheezing, but continues to smoke.   Today in the Emergency Room, her sats are at 94% on room air. The patient has been tachycardic into the one-tens.  Has missed her propranolol dose earlier. A V/Q scan has been ordered by Dr. Mariea Clonts, which is pending at this time. Her troponin is normal. EKG does show T inversions in leads V1, V2, V3, which are same as prior EKG from 2013, and also same on repeated EKG in 1 hour in the Emergency Room.   PAST MEDICAL HISTORY: 1.  Lupus.  2.  Psoriatic arthritis.  3.  Fibromyalgia.  4.  Anxiety.  5.  GERD.  6.  Hypertension.  7.  Panic attacks.  8.  Tachycardia.   ALLERGIES: IV DYE, AND OMNICEF, WHICH CAUSE SHORTNESS OF BREATH.   FAMILY HISTORY: Positive for MI in her father at age 42, and in her brother at age 42, found on autopsy.   SOCIAL HISTORY: The patient smokes a pack a day for many years now. Does not drink any alcohol or use illicit drugs. Lives at home with her husband. Does not work.   REVIEW OF SYSTEMS:  CONSTITUTIONAL: Complains of some fatigue.  EYES:  No blurred vision, pain or redness.  EARS, NOSE, THROAT: No tinnitus, ear pain, hearing loss.   RESPIRATORY: Has cough, some wheezing, shortness of breath, COPD.  CARDIOVASCULAR: Has pleuritic chest pain. No edema.  GASTROINTESTINAL: No nausea, vomiting, diarrhea.  GENITOURINARY: No dysuria, hematuria, frequency.  ENDOCRINE: No polyuria, nocturia or thyroid problems.  HEMATOLOGIC AND LYMPHATIC: No anemia, easy bruising or bleeding.  INTEGUMENTARY: No acne, rash, lesion.  MUSCULOSKELETAL:  Has psoriatic arthritis.  NEUROLOGIC: No focal numbness, weakness, seizure. PSYCHIATRIC: Has anxiety and panic attacks.   HOME MEDICATIONS: Include:  1.  Propranolol 20 mg 2 tablets once a day.  3.  Acetaminophen 325mg  hours as needed for pain and temperature.  4.  Carafate 1 gram oral 3 times a day.  5.  Clonazepam 0.5 mg oral 2 times a day.  6.  Cymbalta 30 mg 2 capsules oral once a day.  7.  Folic acid 1 mg 2 times a day.  8.  Hydroxychloroquine 200 mg oral 2 times a day.  9.  Prevacid 300 mg oral 2 times a day.  10.  Sertraline 50 mg oral once a day.  11.  Trazodone 150 mg oral once a day.   PHYSICAL EXAMINATION: VITAL SIGNS: Shows temperature 98, pulse of 109, blood pressure 138/78, saturating 97% on room air.  GENERAL: Obese Caucasian female patient sitting up in bed, coughing with some trouble breathing.  HEENT: Atraumatic, normocephalic. Oral mucosa moist and pink. External ears and nose  normal. No pallor or icterus. Pupils bilaterally equal and reactive to light.  NECK: Supple. No thyromegaly or palpable lymph nodes. Trachea midline. No carotid bruit or JVD.  PSYCHIATRIC:  Alert and oriented x 3, anxious.  RESPIRATORY: Biatrial mild expiratory wheezing. Good air entry on both sides.  CHEST:  Tenderness all over the chest on palpitations without any rash or redness.  CARDIOVASCULAR: S1, S2, tachycardic without any murmurs. Peripheral pulses 2+. No edema.  GASTROINTESTINAL: Soft abdomen, nontender. Bowel sounds present. No hepatosplenomegaly palpable.  SKIN: Warm and dry. No  petechiae, rash, ulcers.  MUSCULOSKELETAL: No joint swelling, redness, effusion of the large joints. Normal muscle tone.  NEUROLOGICAL: Motor strength 5/5 in upper and lower extremities. Sensation was intact all over. LYMPHATICS:  No cervical lymphadenopathy.   LABORATORY STUDIES: Show glucose of 88, BUN 12, creatinine 0.75, sodium 132, potassium 3.9. AST, ALT, alkaline phosphatase, bilirubin normal. Troponin less than 0.02. CK of 40. WBC 11.7, hemoglobin 15, platelets of 212.   EKG shows sinus tachycardia. T inversions in V2, V3, V4, which are same as old EKGs. No change.   Chest x-ray shows no acute abnormalities.   ASSESSMENT AND PLAN: 1.  Pleuritic chest pain in a patient with chronic obstructive pulmonary disease and acute bronchitis at this time. The patient is tachycardic. A V/Q scan has been ordered which is pending. We will give her 2 rounds of DuoNeb, along with a dose of Solu-Medrol and a dose of azithromycin. We will also check a second set of cardiac enzymes to make sure this is not acute coronary syndrome, although seems unlikely with her presentation. The patient was supposed to get a stress test as outpatient in the past, but has not been followed up. The patient will need routine stress test concerning her EKG changes as outpatient. Present episode is pleuritic chest pain with bronchitis. If the V/Q scan is normal and cardiac enzymes are fine, the patient should be able to go home with steroids, albuterol and antibiotics with an outpatient stress test.  2.  Tobacco abuse. I have counseled the patient for greater than 3 minutes to quit smoking. I also explained that with history of COPD, hypertension, premature coronary artery disease, she should quit smoking. She does understand, but does not seem determined at this time to quit smoking.  3.  Hypertension, tachycardia. Continue propranolol.   Time spent today on this consult was 60 minutes. Discussed the case with Dr. Mariea Clonts of  Emergency Room.    ____________________________ Leia Alf Juhi Lagrange, MD srs:dmm D: 03/10/2013 12:10:00 ET T: 03/10/2013 12:42:31 ET JOB#: [id number]  cc: Alveta Heimlich R. Mirah Nevins, MD, <Dictator> Kirstie Peri. Caryn Section, MD Neita Carp MD ELECTRONICALLY SIGNED 03/23/2013 18:07

## 2014-06-29 ENCOUNTER — Ambulatory Visit (INDEPENDENT_AMBULATORY_CARE_PROVIDER_SITE_OTHER): Payer: Medicare Other | Admitting: Internal Medicine

## 2014-06-29 VITALS — BP 108/80 | HR 74 | Temp 98.1°F | Ht 62.0 in | Wt 154.0 lb

## 2014-06-29 DIAGNOSIS — J449 Chronic obstructive pulmonary disease, unspecified: Secondary | ICD-10-CM

## 2014-06-29 DIAGNOSIS — J441 Chronic obstructive pulmonary disease with (acute) exacerbation: Secondary | ICD-10-CM | POA: Diagnosis not present

## 2014-06-29 MED ORDER — PREDNISONE 10 MG PO TABS: 10.0000 mg | ORAL_TABLET | Freq: Every day | ORAL | Status: DC

## 2014-06-29 MED ORDER — AMOXICILLIN-POT CLAVULANATE 500-125 MG PO TABS: 1.0000 | ORAL_TABLET | Freq: Two times a day (BID) | ORAL | Status: DC

## 2014-06-29 NOTE — Assessment & Plan Note (Signed)
Patient continues to struggle with anxiety complicating her COPD. Per review of her chart her airflow obstruction is only moderate and she is not wheezing on exam today. However, she is having moderate cough with productive sputum and increase use of albuterol with a round of steroid and antibiotic  Plan: -Continue Spiriva and Advair -Try voice exercises when she has wheezing in the evenings which are likely caused by vocal cord dysfunction -see plan for COPD exacerbation.

## 2014-06-29 NOTE — Patient Instructions (Addendum)
Follow up with Dr. Lake Bells in 1 month - Augmentin (500/125) - 1 tab PO q12 hours for 10 days. - Prednisone 30mg   Daily x 5 days - take with breakfast - continue with Spiriva\Advair - use albuterol only if needed for severe wheezing\cough\shortness of breath.  - avoid tobacco - all forms

## 2014-06-29 NOTE — Progress Notes (Signed)
MRN# [id number] Andrea [name] 01/24/1966   CC:"cough, fever, and chills" Chief Complaint  Patient presents with  . Acute Visit    COPD Excerbation--pt saw BQ on 05/25/14. She has been seen in ER and by pcp. She has been given 2 abx and nebulizer txt. She cough thick green mucus.      Events since last clinic visit: Patient presents today for an acute care visit of sob, cough (productive - yellow) and fever\chills for the past 3 wks. She was seen in the ED 2 weeks ago, given Zpak and prednisone taper, which initially helped a little.  Last week she saw her PMD, Dr. Caryn Section, who placed her on Levaquin 500mg  QD x 7days, but patient still having symptoms. She has completed her steroid taper. Currently using albuterol neb BID, and albuterol inhaler TID.  She is still smoking about 2-5 cigs throughout all this.    Medication:   Current Outpatient Rx  Name  Route  Sig  Dispense  Refill  . albuterol (PROVENTIL HFA;VENTOLIN HFA) 108 (90 BASE) MCG/ACT inhaler   Inhalation   Inhale 2 puffs into the lungs every 6 (six) hours as needed for wheezing or shortness of breath.         Marland Kitchen albuterol (PROVENTIL) (2.5 MG/3ML) 0.083% nebulizer solution   Nebulization   Take 2.5 mg by nebulization every 4 (four) hours as needed for wheezing or shortness of breath.         . ALPRAZolam (XANAX) 1 MG tablet   Oral   Take 1 mg by mouth 3 (three) times daily as needed for anxiety.         . ARIPiprazole (ABILIFY) 5 MG tablet   Oral   Take 5 mg by mouth daily.         . clonazePAM (KLONOPIN) 0.5 MG tablet   Oral   Take 0.5 mg by mouth 3 (three) times daily as needed for anxiety.         . DULoxetine (CYMBALTA) 60 MG capsule   Oral   Take 60 mg by mouth daily.         . fluticasone-salmeterol (ADVAIR HFA) 115-21 MCG/ACT inhaler   Inhalation   Inhale 2 puffs into the lungs 2 (two) times daily.   1 Inhaler   6   . hydroxychloroquine (PLAQUENIL) 200 MG tablet   Oral   Take 400 mg by  mouth daily.         . lansoprazole (PREVACID) 30 MG capsule   Oral   Take 30 mg by mouth 2 (two) times daily before a meal.         . nitroGLYCERIN (NITROSTAT) 0.4 MG SL tablet   Sublingual   Place 0.4 mg under the tongue every 5 (five) minutes as needed for chest pain.         Marland Kitchen propranolol (INDERAL) 20 MG tablet   Oral   Take 20 mg by mouth 2 (two) times daily.         Marland Kitchen Respiratory Therapy Supplies (FLUTTER) DEVI   Does not apply   1 Device by Does not apply route daily.   1 each   0   . tiotropium (SPIRIVA) 18 MCG inhalation capsule   Inhalation   Place 18 mcg into inhaler and inhale daily.         . TraZODone HCl 150 MG TB24   Oral   Take 150 mg by mouth at bedtime as needed.  Review of Systems: Gen:  Denies  fever, sweats, chills HEENT: Denies blurred vision, double vision, ear pain, eye pain, hearing loss, nose bleeds, sore throat Cvc:  No dizziness, chest pain or heaviness Resp:   Admits to: Gi: Denies swallowing difficulty, stomach pain, nausea or vomiting, diarrhea, constipation, bowel incontinence Gu:  Denies bladder incontinence, burning urine Ext:   No Joint pain, stiffness or swelling Skin: No skin rash, easy bruising or bleeding or hives Endoc:  No polyuria, polydipsia , polyphagia or weight change Other:  All other systems negative  Allergies:  Cefdinir; Doxycycline; and Ivp dye  Physical Examination:  VS: BP 108/80 mmHg  Pulse 74  Temp(Src) 98.1 F (36.7 C) (Oral)  Ht 5\' 2"  (1.575 m)  Wt 154 lb (69.854 kg)  BMI 28.16 kg/m2  SpO2 97%  General Appearance: No distress  HEENT: PERRLA, no ptosis, no other lesions noticed, mild Left submandibular LAD, TMs with mild erythema Pulmonary:coarse upper airway sounds, dec BS at the bases Cardiovascular:  Normal S1,S2.  No m/r/g.     Abdomen:Exam: Benign, Soft, non-tender, No masses  Skin:   warm, no rashes, no ecchymosis  Extremities: normal, no cyanosis, clubbing, warm with  normal capillary refill.      Rad results: (The following images and results were reviewed by Dr. Stevenson Clinch). CXR 06/19/14 COMPARISON:  03/18/2014, 02/23/2014  FINDINGS: Cardiomediastinal silhouette projects within normal limits in size and contour.  Calcifications of the aortic arch.  No confluent airspace disease, pneumothorax, or pleural effusion.  No displaced fracture.  Unremarkable appearance of the upper abdomen.   IMPRESSION: No radiographic evidence of acute cardiopulmonary disease.    Assessment and Plan:48 yo with COPD exacerbation COPD, GOLD B Patient continues to struggle with anxiety complicating her COPD. Per review of her chart her airflow obstruction is only moderate and she is not wheezing on exam today. However, she is having moderate cough with productive sputum and increase use of albuterol with a round of steroid and antibiotic  Plan: -Continue Spiriva and Advair -Try voice exercises when she has wheezing in the evenings which are likely caused by vocal cord dysfunction -see plan for COPD exacerbation.      COPD exacerbation COPD Exacerbation - seen in the ED 2 wks ago, prednisone taper and zpak, still with symptoms and last week PMD gave levaquin.  Plan - Augmentin (500/125) - 1 tab PO q12 hours for 10 days. - Prednisone 30mg   Daily x 5 days - take with breakfast - continue with Spiriva\Advair - use albuterol only if needed for severe wheezing\cough\shortness of breath.  - avoid tobacco - all forms      Updated Medication List Outpatient Encounter Prescriptions as of 06/29/2014  Medication Sig  . albuterol (PROVENTIL HFA;VENTOLIN HFA) 108 (90 BASE) MCG/ACT inhaler Inhale 2 puffs into the lungs every 6 (six) hours as needed for wheezing or shortness of breath.  Marland Kitchen albuterol (PROVENTIL) (2.5 MG/3ML) 0.083% nebulizer solution Take 2.5 mg by nebulization every 4 (four) hours as needed for wheezing or shortness of breath.  . ALPRAZolam (XANAX) 1 MG  tablet Take 1 mg by mouth 3 (three) times daily as needed for anxiety.  . ARIPiprazole (ABILIFY) 5 MG tablet Take 5 mg by mouth daily.  . clonazePAM (KLONOPIN) 0.5 MG tablet Take 0.5 mg by mouth 3 (three) times daily as needed for anxiety.  . DULoxetine (CYMBALTA) 60 MG capsule Take 60 mg by mouth daily.  . fluticasone-salmeterol (ADVAIR HFA) 115-21 MCG/ACT inhaler Inhale 2 puffs into the  lungs 2 (two) times daily.  . hydroxychloroquine (PLAQUENIL) 200 MG tablet Take 400 mg by mouth daily.  . lansoprazole (PREVACID) 30 MG capsule Take 30 mg by mouth 2 (two) times daily before a meal.  . nitroGLYCERIN (NITROSTAT) 0.4 MG SL tablet Place 0.4 mg under the tongue every 5 (five) minutes as needed for chest pain.  Marland Kitchen propranolol (INDERAL) 20 MG tablet Take 20 mg by mouth 2 (two) times daily.  Marland Kitchen Respiratory Therapy Supplies (FLUTTER) DEVI 1 Device by Does not apply route daily.  Marland Kitchen tiotropium (SPIRIVA) 18 MCG inhalation capsule Place 18 mcg into inhaler and inhale daily.  . TraZODone HCl 150 MG TB24 Take 150 mg by mouth at bedtime as needed.  Marland Kitchen amoxicillin-clavulanate (AUGMENTIN) 500-125 MG per tablet Take 1 tablet (500 mg total) by mouth every 12 (twelve) hours.  . predniSONE (DELTASONE) 10 MG tablet Take 1 tablet (10 mg total) by mouth daily with breakfast.    Orders for this visit: No orders of the defined types were placed in this encounter.    Thank  you for the visitation and for allowing  [facility] to assist in the care of your patient. Our recommendations are noted above.  Please contact us if we can be of further service.  Vilinda Boehringer, MD  Pulmonary and Critical Care Office Number: [phone]

## 2014-06-29 NOTE — Assessment & Plan Note (Addendum)
COPD Exacerbation - seen in the ED 2 wks ago, prednisone taper and zpak, still with symptoms and last week PMD gave levaquin.  Plan - Augmentin (500/125) - 1 tab PO q12 hours for 10 days. - Prednisone 30mg   Daily x 5 days - take with breakfast - continue with Spiriva\Advair - use albuterol only if needed for severe wheezing\cough\shortness of breath.  - avoid tobacco - all forms

## 2014-07-06 ENCOUNTER — Ambulatory Visit: Payer: Medicare Other

## 2014-07-07 ENCOUNTER — Ambulatory Visit
Admission: RE | Admit: 2014-07-07 | Discharge: 2014-07-07 | Disposition: A | Discharge status: Home / Self Care | Payer: Medicare Other | Source: Ambulatory Visit | Attending: Pulmonary Disease | Admitting: Pulmonary Disease

## 2014-07-07 DIAGNOSIS — R911 Solitary pulmonary nodule: Secondary | ICD-10-CM | POA: Diagnosis present

## 2014-07-07 DIAGNOSIS — J449 Chronic obstructive pulmonary disease, unspecified: Secondary | ICD-10-CM

## 2014-07-07 DIAGNOSIS — R918 Other nonspecific abnormal finding of lung field: Secondary | ICD-10-CM | POA: Insufficient documentation

## 2014-07-07 DIAGNOSIS — J439 Emphysema, unspecified: Secondary | ICD-10-CM | POA: Diagnosis not present

## 2014-09-01 ENCOUNTER — Other Ambulatory Visit: Payer: Self-pay | Admitting: Family Medicine

## 2014-09-01 NOTE — Telephone Encounter (Signed)
OK to call in rx. Thanks.  Same sig.

## 2014-09-22 ENCOUNTER — Ambulatory Visit (INDEPENDENT_AMBULATORY_CARE_PROVIDER_SITE_OTHER): Payer: Medicare Other | Admitting: Family Medicine

## 2014-09-22 ENCOUNTER — Encounter: Payer: Self-pay | Admitting: Family Medicine

## 2014-09-22 VITALS — BP 106/72 | HR 88 | Temp 97.6°F | Resp 16 | Wt 153.0 lb

## 2014-09-22 DIAGNOSIS — F419 Anxiety disorder, unspecified: Secondary | ICD-10-CM | POA: Insufficient documentation

## 2014-09-22 DIAGNOSIS — G4700 Insomnia, unspecified: Secondary | ICD-10-CM | POA: Insufficient documentation

## 2014-09-22 DIAGNOSIS — L301 Dyshidrosis [pompholyx]: Secondary | ICD-10-CM | POA: Insufficient documentation

## 2014-09-22 DIAGNOSIS — L930 Discoid lupus erythematosus: Secondary | ICD-10-CM

## 2014-09-22 DIAGNOSIS — K219 Gastro-esophageal reflux disease without esophagitis: Secondary | ICD-10-CM | POA: Insufficient documentation

## 2014-09-22 DIAGNOSIS — F32A Depression, unspecified: Secondary | ICD-10-CM | POA: Insufficient documentation

## 2014-09-22 DIAGNOSIS — R918 Other nonspecific abnormal finding of lung field: Secondary | ICD-10-CM | POA: Insufficient documentation

## 2014-09-22 DIAGNOSIS — J0110 Acute frontal sinusitis, unspecified: Secondary | ICD-10-CM | POA: Diagnosis not present

## 2014-09-22 DIAGNOSIS — F410 Panic disorder [episodic paroxysmal anxiety] without agoraphobia: Secondary | ICD-10-CM | POA: Insufficient documentation

## 2014-09-22 DIAGNOSIS — F329 Major depressive disorder, single episode, unspecified: Secondary | ICD-10-CM | POA: Insufficient documentation

## 2014-09-22 MED ORDER — FLUTICASONE PROPIONATE 50 MCG/ACT NA SUSP: 2.0000 | Freq: Every day | NASAL | Status: DC

## 2014-09-22 MED ORDER — AMOXICILLIN 500 MG PO CAPS: 1000.0000 mg | ORAL_CAPSULE | Freq: Two times a day (BID) | ORAL | Status: DC

## 2014-09-22 NOTE — Progress Notes (Signed)
Patient: Andrea [name] Female    DOB: March 19, 1965   49 y.o.   MRN: [id number] Visit Date: 09/22/2014  Today's Provider: Lelon Huh, MD   Chief Complaint  Patient presents with  . Ear Pain    x 1 day   Subjective:    HPI Ear pain: Patient comes in today complaining of right ear pain that stated yesterday. Patient described the pain as a throbbing sensation. Patient also has tenderness in her sinuses and her right nostril has been running. Patient reports that she got water in her ear the other day while taking a shower. Patient states yesterday she experienced chills and sweats.     Allergies  Allergen Reactions  . Cefdinir     tachycardia  . Doxycycline     Nausea, migraine  . Ivp Dye [Iodinated Diagnostic Agents]     Heart palpitations   Previous Medications   ALBUTEROL (PROVENTIL HFA;VENTOLIN HFA) 108 (90 BASE) MCG/ACT INHALER    Inhale 2 puffs into the lungs every 6 (six) hours as needed for wheezing or shortness of breath.   ALBUTEROL (PROVENTIL) (2.5 MG/3ML) 0.083% NEBULIZER SOLUTION    Take 2.5 mg by nebulization every 4 (four) hours as needed for wheezing or shortness of breath.   ALPRAZOLAM (XANAX) 1 MG TABLET    TAKE 1 TABLET BY MOUTH EVERY 8 HOURS AS NEEDED   ARIPIPRAZOLE (ABILIFY) 5 MG TABLET    Take 5 mg by mouth daily.   BUDESONIDE-FORMOTEROL (SYMBICORT) 160-4.5 MCG/ACT INHALER    Inhale 1 puff into the lungs. Up to 3 times a day   CLONAZEPAM (KLONOPIN) 0.5 MG TABLET    Take 0.5 mg by mouth 3 (three) times daily as needed for anxiety.   DULOXETINE (CYMBALTA) 60 MG CAPSULE    Take 60 mg by mouth daily.   HYDROXYCHLOROQUINE (PLAQUENIL) 200 MG TABLET    Take 400 mg by mouth daily.   LANSOPRAZOLE (PREVACID) 30 MG CAPSULE    Take 30 mg by mouth 2 (two) times daily before a meal.   NITROGLYCERIN (NITROSTAT) 0.4 MG SL TABLET    Place 0.4 mg under the tongue every 5 (five) minutes as needed for chest pain.   PROPRANOLOL (INDERAL) 20 MG TABLET    Take 20 mg by  mouth 2 (two) times daily.   RESPIRATORY THERAPY SUPPLIES (FLUTTER) DEVI    1 Device by Does not apply route daily.   TIOTROPIUM (SPIRIVA) 18 MCG INHALATION CAPSULE    Place 18 mcg into inhaler and inhale daily.   TRAZODONE HCL 150 MG TB24    Take 150 mg by mouth at bedtime as needed.    Review of Systems  Constitutional: Positive for chills, diaphoresis and fatigue. Negative for fever and appetite change.  HENT: Positive for congestion (nasal), ear discharge (moisture in her right ear), ear pain, postnasal drip, rhinorrhea (right nostril) and sinus pressure. Negative for facial swelling, hearing loss, mouth sores, nosebleeds, sneezing, sore throat, tinnitus and trouble swallowing.   Eyes: Negative for photophobia, pain, discharge, redness, itching and visual disturbance.  Respiratory: Negative for apnea, cough, choking, chest tightness, shortness of breath, wheezing and stridor.   Neurological: Positive for headaches. Negative for dizziness and light-headedness.    History  Substance Use Topics  . Smoking status: Current Every Day Smoker -- 1.00 packs/day for 16 years    Types: Cigarettes  . Smokeless tobacco: Never Used     Comment: down to .5ppd/ 05/25/14  . Alcohol  Use: No   Objective:   BP 106/72 mmHg  Pulse 88  Temp(Src) 97.6 F (36.4 C) (Oral)  Resp 16  Wt 153 lb (69.4 kg)  SpO2 96%  Physical Exam General Appearance:    Alert, cooperative, no distress  HENT:   right TM red, dull, bulging, neck without nodes, frontal and maxillary sinus tender, post nasal drip noted and nasal mucosa congested  Eyes:    PERRL, conjunctiva/corneas clear, EOM's intact       Lungs:     Clear to auscultation bilaterally, respirations unlabored  Heart:    Regular rate and rhythm  Neurologic:   Awake, alert, oriented x 3. No apparent focal neurological           defect.            Assessment & Plan:     1. Acute frontal sinusitis, recurrence not specified   - amoxicillin (AMOXIL) 500 MG  capsule; Take 2 capsules (1,000 mg total) by mouth 2 (two) times daily.  Dispense: 40 capsule; Refill: 0 - fluticasone (FLONASE) 50 MCG/ACT nasal spray; Place 2 sprays into both nostrils daily.  Dispense: 16 g; Refill: 6          Lelon Huh, MD  [facility]

## 2014-09-23 ENCOUNTER — Ambulatory Visit: Payer: Self-pay | Admitting: Family Medicine

## 2014-10-02 ENCOUNTER — Emergency Department: Payer: Medicare Other

## 2014-10-02 ENCOUNTER — Emergency Department
Admission: EM | Admit: 2014-10-02 | Discharge: 2014-10-02 | Disposition: A | Discharge status: Home / Self Care | Payer: Medicare Other | Attending: Emergency Medicine | Admitting: Emergency Medicine

## 2014-10-02 ENCOUNTER — Other Ambulatory Visit: Payer: Self-pay

## 2014-10-02 DIAGNOSIS — Z79811 Long term (current) use of aromatase inhibitors: Secondary | ICD-10-CM | POA: Diagnosis not present

## 2014-10-02 DIAGNOSIS — J441 Chronic obstructive pulmonary disease with (acute) exacerbation: Secondary | ICD-10-CM | POA: Diagnosis not present

## 2014-10-02 DIAGNOSIS — Z791 Long term (current) use of non-steroidal anti-inflammatories (NSAID): Secondary | ICD-10-CM | POA: Insufficient documentation

## 2014-10-02 DIAGNOSIS — Z7951 Long term (current) use of inhaled steroids: Secondary | ICD-10-CM | POA: Diagnosis not present

## 2014-10-02 DIAGNOSIS — Z79899 Other long term (current) drug therapy: Secondary | ICD-10-CM | POA: Diagnosis not present

## 2014-10-02 DIAGNOSIS — J449 Chronic obstructive pulmonary disease, unspecified: Secondary | ICD-10-CM | POA: Diagnosis not present

## 2014-10-02 DIAGNOSIS — R0981 Nasal congestion: Secondary | ICD-10-CM | POA: Diagnosis present

## 2014-10-02 DIAGNOSIS — Z720 Tobacco use: Secondary | ICD-10-CM | POA: Insufficient documentation

## 2014-10-02 DIAGNOSIS — Z792 Long term (current) use of antibiotics: Secondary | ICD-10-CM | POA: Insufficient documentation

## 2014-10-02 DIAGNOSIS — R0602 Shortness of breath: Secondary | ICD-10-CM | POA: Diagnosis not present

## 2014-10-02 DIAGNOSIS — F17210 Nicotine dependence, cigarettes, uncomplicated: Secondary | ICD-10-CM | POA: Diagnosis not present

## 2014-10-02 DIAGNOSIS — R05 Cough: Secondary | ICD-10-CM | POA: Diagnosis not present

## 2014-10-02 LAB — CBC
HCT: 42.5 % (ref 35.0–47.0)
Hemoglobin: 14.4 g/dL (ref 12.0–16.0)
MCH: 28.9 pg (ref 26.0–34.0)
MCHC: 33.9 g/dL (ref 32.0–36.0)
MCV: 85.1 fL (ref 80.0–100.0)
Platelets: 272 10*3/uL (ref 150–440)
RBC: 4.99 MIL/uL (ref 3.80–5.20)
RDW: 13.9 % (ref 11.5–14.5)
WBC: 7.6 10*3/uL (ref 3.6–11.0)

## 2014-10-02 LAB — BASIC METABOLIC PANEL
ANION GAP: 8 (ref 5–15)
BUN: 12 mg/dL (ref 6–20)
CALCIUM: 9.1 mg/dL (ref 8.9–10.3)
CHLORIDE: 104 mmol/L (ref 101–111)
CO2: 30 mmol/L (ref 22–32)
Creatinine, Ser: 0.71 mg/dL (ref 0.44–1.00)
GFR calc Af Amer: >60 mL/min (ref >60)
GFR calc non Af Amer: >60 mL/min (ref >60)
GLUCOSE: 110 mg/dL — AB (ref 65–99)
Potassium: 4.6 mmol/L (ref 3.5–5.1)
Sodium: 142 mmol/L (ref 135–145)

## 2014-10-02 MED ORDER — IPRATROPIUM-ALBUTEROL 0.5-2.5 (3) MG/3ML IN SOLN
3.0000 mL | Freq: Once | RESPIRATORY_TRACT | Status: AC
  Administered 2014-10-02: 3 mL via RESPIRATORY_TRACT
  Filled 2014-10-02: qty 3

## 2014-10-02 MED ORDER — PREDNISONE 20 MG PO TABS
60.0000 mg | ORAL_TABLET | Freq: Once | ORAL | Status: AC
  Administered 2014-10-02: 60 mg via ORAL
  Filled 2014-10-02: qty 3

## 2014-10-02 MED ORDER — PREDNISONE 20 MG PO TABS: 60.0000 mg | ORAL_TABLET | Freq: Once | ORAL | Status: DC

## 2014-10-02 NOTE — Discharge Instructions (Signed)
Emergency department for any new or worsening condition including trouble breathing, fever, confusion or altered mental status, or chest pain.   Chronic Obstructive Pulmonary Disease Exacerbation Chronic obstructive pulmonary disease (COPD) is a common lung condition in which airflow from the lungs is limited. COPD is a general term that can be used to describe many different lung problems that limit airflow, including chronic bronchitis and emphysema. COPD exacerbations are episodes when breathing symptoms become much worse and require extra treatment. Without treatment, COPD exacerbations can be life threatening, and frequent COPD exacerbations can cause further damage to your lungs. CAUSES   Respiratory infections.   Exposure to smoke.   Exposure to air pollution, chemical fumes, or dust. Sometimes there is no apparent cause or trigger. RISK FACTORS  Smoking cigarettes.  Older age.  Frequent prior COPD exacerbations. SIGNS AND SYMPTOMS   Increased coughing.   Increased thick spit (sputum) production.   Increased wheezing.   Increased shortness of breath.   Rapid breathing.   Chest tightness. DIAGNOSIS  Your medical history, a physical exam, and tests will help your health care provider make a diagnosis. Tests may include:  A chest X-ray.  Basic lab tests.  Sputum testing.  An arterial blood gas test. TREATMENT  Depending on the severity of your COPD exacerbation, you may need to be admitted to a hospital for treatment. Some of the treatments commonly used to treat COPD exacerbations are:   Antibiotic medicines.   Bronchodilators. These are drugs that expand the air passages. They may be given with an inhaler or nebulizer. Spacer devices may be needed to help improve drug delivery.  Corticosteroid medicines.  Supplemental oxygen therapy.  HOME CARE INSTRUCTIONS   Do not smoke. Quitting smoking is very important to prevent COPD from getting worse and  exacerbations from happening as often.  Avoid exposure to all substances that irritate the airway, especially to tobacco smoke.   If you were prescribed an antibiotic medicine, finish it all even if you start to feel better.  Take all medicines as directed by your health care provider.It is important to use correct technique with inhaled medicines.  Drink enough fluids to keep your urine clear or pale yellow (unless you have a medical condition that requires fluid restriction).  Use a cool mist vaporizer. This makes it easier to clear your chest when you cough.   If you have a home nebulizer and oxygen, continue to use them as directed.   Maintain all necessary vaccinations to prevent infections.   Exercise regularly.   Eat a healthy diet.   Keep all follow-up appointments as directed by your health care provider. SEEK IMMEDIATE MEDICAL CARE IF:  You have worsening shortness of breath.   You have trouble talking.   You have severe chest pain.  You have blood in your sputum.  You have a fever.  You have weakness, vomit repeatedly, or faint.   You feel confused.   You continue to get worse. MAKE SURE YOU:   Understand these instructions.  Will watch your condition.  Will get help right away if you are not doing well or get worse. Document Released: 12/16/2006 Document Revised: 07/05/2013 Document Reviewed: 10/23/2012 [facility] Patient Information 2015 [place], [place]. This information is not intended to replace advice given to you by your health care provider. Make sure you discuss any questions you have with your health care provider.

## 2014-10-02 NOTE — ED Provider Notes (Signed)
[facility] Emergency Department Provider Note   ____________________________________________  Time seen: 9:20 AM I have reviewed the triage vital signs and the triage nursing note.  HISTORY  Chief Complaint Shortness of Breath; Cough; Fatigue; and Nasal Congestion   Historian Patient  HPI Andrea [name] is a 49 y.o. female who is a smoker and has a history of COPD who is here for evaluation of shortness of breath and dry cough. Symptoms have been ongoing for about 2 weeks. No fever, but she does report sweats and chills at times. No recent COPD exacerbation. No chest pain. No leg swelling. Symptoms are moderate. Reports getting into a coughing fit where she has trouble breathing during that coughing fit. She's been using her albuterol every 4-6 hours.    Past Medical History  Diagnosis Date  . Arrhythmia     Patient Active Problem List   Diagnosis Date Noted  . Anxiety 09/22/2014  . Colon polyp 09/22/2014  . Depression 09/22/2014  . Dyshidrosis 09/22/2014  . GERD (gastroesophageal reflux disease) 09/22/2014  . Insomnia 09/22/2014  . Lung nodule, multiple 09/22/2014  . Panic disorder 09/22/2014  . Chest pain 01/17/2014  . Palpitations 01/17/2014  . Pulmonary nodule 06/10/2013  . Shortness of breath 05/20/2013  . COPD, GOLD B 05/20/2013  . Tobacco abuse 05/20/2013  . Daytime somnolence 05/20/2013  . Discoid lupus 05/30/2012  . Fibromyalgia 02/11/2012  . PVC's (premature ventricular contractions) 01/29/2011  . Essential (primary) hypertension 03/04/1998    Past Surgical History  Procedure Laterality Date  . Cardiac catheterization    . Vaginal hysterectomy  1995    vaginal    Current Outpatient Rx  Name  Route  Sig  Dispense  Refill  . albuterol (PROVENTIL HFA;VENTOLIN HFA) 108 (90 BASE) MCG/ACT inhaler   Inhalation   Inhale 2 puffs into the lungs every 6 (six) hours as needed for wheezing or shortness of breath.         Marland Kitchen  albuterol (PROVENTIL) (2.5 MG/3ML) 0.083% nebulizer solution   Nebulization   Take 2.5 mg by nebulization every 4 (four) hours as needed for wheezing or shortness of breath.         . ALPRAZolam (XANAX) 1 MG tablet      TAKE 1 TABLET BY MOUTH EVERY 8 HOURS AS NEEDED   60 tablet   1     Not to exceed 2 additional fills before 12/10/2014 ...   . ARIPiprazole (ABILIFY) 5 MG tablet   Oral   Take 5 mg by mouth daily.         . budesonide-formoterol (SYMBICORT) 160-4.5 MCG/ACT inhaler   Inhalation   Inhale 1 puff into the lungs. Up to 3 times a day         . clonazePAM (KLONOPIN) 0.5 MG tablet   Oral   Take 0.5 mg by mouth 3 (three) times daily as needed for anxiety.         . DULoxetine (CYMBALTA) 60 MG capsule   Oral   Take 60 mg by mouth daily.         . fluticasone (FLONASE) 50 MCG/ACT nasal spray   Each Nare   Place 2 sprays into both nostrils daily.   16 g   6   . hydroxychloroquine (PLAQUENIL) 200 MG tablet   Oral   Take 400 mg by mouth daily.         . lansoprazole (PREVACID) 30 MG capsule   Oral   Take 30  mg by mouth 2 (two) times daily before a meal.         . nitroGLYCERIN (NITROSTAT) 0.4 MG SL tablet   Sublingual   Place 0.4 mg under the tongue every 5 (five) minutes as needed for chest pain.         Marland Kitchen propranolol (INDERAL) 20 MG tablet   Oral   Take 20 mg by mouth 2 (two) times daily.         Marland Kitchen Respiratory Therapy Supplies (FLUTTER) DEVI   Does not apply   1 Device by Does not apply route daily.   1 each   0   . tiotropium (SPIRIVA) 18 MCG inhalation capsule   Inhalation   Place 18 mcg into inhaler and inhale daily.         . TraZODone HCl 150 MG TB24   Oral   Take 150 mg by mouth at bedtime as needed.         Marland Kitchen amoxicillin (AMOXIL) 500 MG capsule   Oral   Take 2 capsules (1,000 mg total) by mouth 2 (two) times daily. Patient not taking: Reported on 10/02/2014   40 capsule   0   . predniSONE (DELTASONE) 20 MG tablet    Oral   Take 3 tablets (60 mg total) by mouth once.   12 tablet   0     Allergies Cefdinir; Doxycycline; and Ivp dye  Family History  Problem Relation Age of Onset  . Emphysema Father   . Asthma Father   . Heart disease Father   . Cancer Father     colon  . Heart attack Father   . Arthritis Father     RA  . Hypertension Mother     Social History History  Substance Use Topics  . Smoking status: Current Every Day Smoker -- 1.00 packs/day for 16 years    Types: Cigarettes  . Smokeless tobacco: Never Used     Comment: down to .5ppd/ 05/25/14  . Alcohol Use: No    Review of Systems  Constitutional: Negative for fever. Eyes: Negative for visual changes. ENT: Negative for sore throat. Cardiovascular: Negative for chest pain. Respiratory: No sputum production Gastrointestinal: Negative for abdominal pain, vomiting and diarrhea. Genitourinary: Negative for dysuria. Musculoskeletal: Negative for back pain. Skin: Negative for rash. Neurological: Negative for headaches, focal weakness or numbness. 10 point Review of Systems otherwise negative ____________________________________________   PHYSICAL EXAM:  VITAL SIGNS: ED Triage Vitals  Enc Vitals Group     BP 10/02/14 0712 114/83 mmHg     Pulse Rate 10/02/14 0712 88     Resp --      Temp 10/02/14 0712 97.9 F (36.6 C)     Temp Source 10/02/14 0712 Oral     SpO2 10/02/14 0712 96 %     Weight 10/02/14 0712 150 lb (68.04 kg)     Height 10/02/14 0712 5\' 3"  (1.6 m)     Head Cir --      Peak Flow --      Pain Score --      Pain Loc --      Pain Edu? --      Excl. in [place]? --      Constitutional: Alert and oriented. Well appearing and in no distress. Eyes: Conjunctivae are normal. PERRL. Normal extraocular movements. ENT   Head: Normocephalic and atraumatic.   Nose: No congestion/rhinnorhea.   Mouth/Throat: Mucous membranes are moist.   Neck: No stridor. Cardiovascular/Chest: Normal  rate, regular  rhythm.  No murmurs, rubs, or gallops. Respiratory: Normal respiratory effort without tachypnea nor retractions. Mild expiratory wheeze in all fields. No rhonchi or rales.  Gastrointestinal: Soft. No distention, no guarding, no rebound. Nontender   Genitourinary/rectal:Deferred Musculoskeletal: Nontender with normal range of motion in all extremities. No joint effusions.  No lower extremity tenderness nor edema. Neurologic:  Normal speech and language. No gross or focal neurologic deficits are appreciated. Skin:  Skin is warm, dry and intact. No rash noted. Psychiatric: Mood and affect are normal. Speech and behavior are normal. Patient exhibits appropriate insight and judgment.  ____________________________________________   EKG I, Lisa Roca, MD, the attending physician have personally viewed and interpreted all ECGs.  85 bpm. Normal sinus rhythm. Narrow QRS. Normal axis. Nonspecific T wave.  Anterior T-wave inversions are similar to prior EKGs. ____________________________________________  LABS (pertinent positives/negatives)  Metabolic panel within normal limits CBC within normal limits  ____________________________________________  RADIOLOGY All Xrays were viewed by me. Imaging interpreted by Radiologist.  Chest x-ray: Hyperexpanded lungs without acute cardio pulmonary disease __________________________________________  PROCEDURES  Procedure(s) performed: None Critical Care performed: None  ____________________________________________   ED COURSE / ASSESSMENT AND PLAN  CONSULTATIONS: None  Pertinent labs & imaging results that were available during my care of the patient were reviewed by me and considered in my medical decision making (see chart for details).   Symptoms consistent with wheezing, bronchospastic cough a patient with COPD and continued smoking. Vital signs reassuring. Physical exam and laboratory evaluation reassuring. I would add prednisone for  COPD exacerbation. Do not suspect ACS. ECG unchanged from prior. There is no chest pain at all.  Patient able to tolerate walking without any hypoxia or respiratory distress. Okay for outpatient treatment.  Patient / Family / Caregiver informed of clinical course, medical decision-making process, and agree with plan.   I discussed return precautions, follow-up instructions, and discharged instructions with patient and/or family.  ___________________________________________   FINAL CLINICAL IMPRESSION(S) / ED DIAGNOSES   Final diagnoses:  COPD exacerbation    FOLLOW UP  Referred to: Primary care physician, one week   Lisa Roca, MD 10/02/14 [phone]

## 2014-10-02 NOTE — ED Notes (Signed)
Pt states she has copd. Pt states 2 weeks of cough, congestion, sob. Pt came home early from vacation because she feels like she cant breath. Pt also states cold sweat and chills at times.

## 2014-10-03 ENCOUNTER — Telehealth: Payer: Self-pay | Admitting: Pulmonary Disease

## 2014-10-03 NOTE — Telephone Encounter (Signed)
Called and spoke to pt. Pt requesting an appt this week after being seen in ED. Appt made with Dr. Ashby Dawes on 8.2.16 at 10:30. Pt verbalized understanding and denied any further questions or concerns at this time.

## 2014-10-04 ENCOUNTER — Encounter: Payer: Self-pay | Admitting: Internal Medicine

## 2014-10-04 ENCOUNTER — Ambulatory Visit (INDEPENDENT_AMBULATORY_CARE_PROVIDER_SITE_OTHER): Payer: Medicare Other | Admitting: Internal Medicine

## 2014-10-04 ENCOUNTER — Telehealth: Payer: Self-pay | Admitting: Internal Medicine

## 2014-10-04 VITALS — BP 110/80 | HR 83 | Ht 63.0 in | Wt 153.0 lb

## 2014-10-04 DIAGNOSIS — G4710 Hypersomnia, unspecified: Secondary | ICD-10-CM | POA: Diagnosis not present

## 2014-10-04 DIAGNOSIS — I10 Essential (primary) hypertension: Secondary | ICD-10-CM

## 2014-10-04 DIAGNOSIS — R0602 Shortness of breath: Secondary | ICD-10-CM

## 2014-10-04 DIAGNOSIS — R400 Somnolence: Secondary | ICD-10-CM

## 2014-10-04 DIAGNOSIS — R918 Other nonspecific abnormal finding of lung field: Secondary | ICD-10-CM

## 2014-10-04 DIAGNOSIS — Z720 Tobacco use: Secondary | ICD-10-CM

## 2014-10-04 DIAGNOSIS — R911 Solitary pulmonary nodule: Secondary | ICD-10-CM

## 2014-10-04 DIAGNOSIS — F419 Anxiety disorder, unspecified: Secondary | ICD-10-CM

## 2014-10-04 DIAGNOSIS — F410 Panic disorder [episodic paroxysmal anxiety] without agoraphobia: Secondary | ICD-10-CM

## 2014-10-04 DIAGNOSIS — J441 Chronic obstructive pulmonary disease with (acute) exacerbation: Secondary | ICD-10-CM | POA: Insufficient documentation

## 2014-10-04 DIAGNOSIS — J449 Chronic obstructive pulmonary disease, unspecified: Secondary | ICD-10-CM | POA: Insufficient documentation

## 2014-10-04 MED ORDER — PREDNISONE 20 MG PO TABS: 20.0000 mg | ORAL_TABLET | Freq: Once | ORAL | Status: DC

## 2014-10-04 NOTE — Assessment & Plan Note (Signed)
--  Currently klonopin tid, she takes xanax 2 or 3 days per week.  --Uses trazodone to sleep about 1 night per week.

## 2014-10-04 NOTE — Assessment & Plan Note (Addendum)
--  Severe COPD, continue bronchodilators.  --She uses nebulized albuterol but not ipratropium; 2 to 3 times per day when she is not feeling well.  --She uses spiriva daily.  --She has advair but was asked not to use it; I will restart this.

## 2014-10-04 NOTE — Assessment & Plan Note (Signed)
05/2013 Sleep study [facility] > AHI 3.8

## 2014-10-04 NOTE — Progress Notes (Signed)
[facility] Note     Assessment and Plan: Pulmonary nodule 06/2013 CT high res chest> mild centrilobular emphysema but no ILD, multiple scattered pulm nodules all 23mm in size 07/2014 CT chest > nodule unchanged --Repeat CT chest in 1 year, if stable can stop surveillance at that time.   Daytime somnolence 05/2013 Sleep study [facility] > AHI 3.8  Tobacco abuse --Continues to smoke, discussed cessation strategies today.    COPD, GOLD B --Severe COPD, continue bronchodilators.  --She uses nebulized albuterol but not ipratropium; 2 to 3 times per day when she is not feeling well.  --She uses spiriva daily.  --Continue symbicort.    Anxiety --Currently klonopin tid, she takes xanax 2 or 3 days per week.  --Uses trazodone to sleep about 1 night per week.       Chest x-ray PA and lateral views reviewed from 10/02/14; showed emphysematous changes, otherwise unremarkable with hyperinflated lungs CT chest from 06/10/2013 was reviewed that showed some scattered emphysematous changes but otherwise unremarkable.   HPI:   This Andrea [name] is a 49 year old Caucasian female with severe COPD, which appears to be complicated by anxiety. She has been maintained on's. On Advair. She has had frequent exacerbations requiring prednisone taper in the past. She was in the ER again this past weekend because she was having trouble breathing, she has been coughing for about a week, and then needed to go to the ER. She was up all night 3 nights ago and decided to go to the ER in the morning. She was given a breathing treatment and prednisone which she is on currently.  She notes that her breathing is a bit better, but she is still coughing and having cold symptoms.   She has quit smoking with [employer] in the past for about 2 months a year ago. She went to smoking due to anxiety.   Social Hx:   History  Substance Use Topics  . Smoking status: Current Every Day Smoker -- 1.00 packs/day for 16  years    Types: Cigarettes  . Smokeless tobacco: Never Used     Comment: down to .5ppd/ 05/25/14  . Alcohol Use: No   Medication:   Current Outpatient Rx  Name  Route  Sig  Dispense  Refill  . albuterol (PROVENTIL HFA;VENTOLIN HFA) 108 (90 BASE) MCG/ACT inhaler   Inhalation   Inhale 2 puffs into the lungs every 6 (six) hours as needed for wheezing or shortness of breath.         Marland Kitchen albuterol (PROVENTIL) (2.5 MG/3ML) 0.083% nebulizer solution   Nebulization   Take 2.5 mg by nebulization every 4 (four) hours as needed for wheezing or shortness of breath.         . ALPRAZolam (XANAX) 1 MG tablet      TAKE 1 TABLET BY MOUTH EVERY 8 HOURS AS NEEDED   60 tablet   1     Not to exceed 2 additional fills before 12/10/2014 ...   . ARIPiprazole (ABILIFY) 5 MG tablet   Oral   Take 5 mg by mouth daily.         . budesonide-formoterol (SYMBICORT) 160-4.5 MCG/ACT inhaler   Inhalation   Inhale 1 puff into the lungs. Up to 3 times a day         . clonazePAM (KLONOPIN) 0.5 MG tablet   Oral   Take 0.5 mg by mouth 3 (three) times daily as needed for anxiety.         Marland Kitchen  DULoxetine (CYMBALTA) 60 MG capsule   Oral   Take 60 mg by mouth daily.         . fluticasone (FLONASE) 50 MCG/ACT nasal spray   Each Nare   Place 2 sprays into both nostrils daily.   16 g   6   . hydroxychloroquine (PLAQUENIL) 200 MG tablet   Oral   Take 400 mg by mouth daily.         . lansoprazole (PREVACID) 30 MG capsule   Oral   Take 30 mg by mouth 2 (two) times daily before a meal.         . nitroGLYCERIN (NITROSTAT) 0.4 MG SL tablet   Sublingual   Place 0.4 mg under the tongue every 5 (five) minutes as needed for chest pain.         . predniSONE (DELTASONE) 20 MG tablet   Oral   Take 3 tablets (60 mg total) by mouth once.   12 tablet   0   . propranolol (INDERAL) 20 MG tablet   Oral   Take 20 mg by mouth 2 (two) times daily.         Marland Kitchen Respiratory Therapy Supplies (FLUTTER) DEVI    Does not apply   1 Device by Does not apply route daily.   1 each   0   . tiotropium (SPIRIVA) 18 MCG inhalation capsule   Inhalation   Place 18 mcg into inhaler and inhale daily.         . TraZODone HCl 150 MG TB24   Oral   Take 150 mg by mouth at bedtime as needed.             Allergies:  Cefdinir; Doxycycline; and Ivp dye  Review of Systems: Gen:  Denies  fever, sweats, chills HEENT: Denies blurred vision, double vision, ear pain, eye pain, hearing loss, nose bleeds, sore throat Cvc:  No dizziness, chest pain or heaviness Resp:   Denies cough or sputum porduction, shortness of breath Gi: Denies swallowing difficulty, stomach pain, nausea or vomiting, diarrhea, constipation, bowel incontinence Gu:  Denies bladder incontinence, burning urine Ext:   No Joint pain, stiffness or swelling Skin: No skin rash, easy bruising or bleeding or hives Endoc:  No polyuria, polydipsia , polyphagia or weight change Psych: No depression, insomnia or hallucinations  Other:  All other systems negative  Physical Examination:   VS: BP 110/80 mmHg  Pulse 83  Ht 5\' 3"  (1.6 m)  Wt 153 lb (69.4 kg)  BMI 27.11 kg/m2  SpO2 98%  General Appearance: No distress  Neuro:without focal findings,  speech normal,  HEENT: PERRLA, EOM intact.   Pulmonary: normal breath sounds, No wheezing, No rales;    CardiovascularNormal S1,S2.  No m/r/g.   Abdomen: Benign, Soft, non-tender. Renal:  No costovertebral tenderness  GU:  No performed at this time. Endoc: No evident thyromegaly Skin:   warm, no rashes, no ecchymosis  Extremities: normal, no cyanosis, clubbing.   Labs results:   Rad results:     Other:      Orders for this visit: No orders of the defined types were placed in this encounter.     Thank  you for the consultation and for allowing [facility] to assist in the care of your patient. Our recommendations are noted above.  Please contact us if we can be of  further service.   Marda Stalker, MD.  Board Certified in Internal Medicine, Pulmonary Medicine, Critical Care  Medicine, and Sleep Medicine.  [facility] Office Number: [phone]  Patricia Pesa, M.D.  Vilinda Boehringer, M.D.  Cheral Marker, M.D

## 2014-10-04 NOTE — Assessment & Plan Note (Signed)
--  Continues to smoke, discussed cessation strategies today.

## 2014-10-04 NOTE — Telephone Encounter (Signed)
Spoke with Estill Bamberg and advised pred should be taken once per day until gone per AVS from today  Nothing further needed

## 2014-10-04 NOTE — Assessment & Plan Note (Signed)
>>  ASSESSMENT AND PLAN FOR PULMONARY NODULE WRITTEN ON 10/04/2014 10:52 AM BY VERDIA ART, MD  06/2013 CT high res chest> mild centrilobular emphysema but no ILD, multiple scattered pulm nodules all 4mm in size 07/2014 CT chest > nodule unchanged --Repeat CT chest in 1 year, if stable can stop surveillance at that time.

## 2014-10-04 NOTE — Assessment & Plan Note (Addendum)
06/2013 CT high res chest> mild centrilobular emphysema but no ILD, multiple scattered pulm nodules all 20mm in size 07/2014 CT chest > nodule unchanged --Repeat CT chest in 1 year, if stable can stop surveillance at that time.

## 2014-10-04 NOTE — Patient Instructions (Addendum)
--  After you finish the prednisone from the ER, fill the prescription that I am giving you for prednisone 1 tab per day for 5 days then stop.

## 2014-10-10 ENCOUNTER — Other Ambulatory Visit: Payer: Self-pay | Admitting: Family Medicine

## 2014-10-10 ENCOUNTER — Telehealth: Payer: Self-pay | Admitting: Family Medicine

## 2014-10-10 DIAGNOSIS — J0110 Acute frontal sinusitis, unspecified: Secondary | ICD-10-CM

## 2014-10-10 MED ORDER — AMOXICILLIN 500 MG PO CAPS: 1000.0000 mg | ORAL_CAPSULE | Freq: Two times a day (BID) | ORAL | Status: DC

## 2014-10-10 MED ORDER — AMOXICILLIN 500 MG PO CAPS: 1000.0000 mg | ORAL_CAPSULE | Freq: Two times a day (BID) | ORAL | Status: AC

## 2014-10-10 NOTE — Telephone Encounter (Signed)
Did she improve at al when she took amoxicillin last month. If not then we should change to a different antibiotic.

## 2014-10-10 NOTE — Telephone Encounter (Signed)
Patient stated that she had some improvement, however symptoms never went completely away.

## 2014-10-10 NOTE — Telephone Encounter (Signed)
Pt has finished her antibiotic and thinks she needs another round.  She is not better.  She use [facility]  Thanks Con Memos

## 2014-10-11 NOTE — Telephone Encounter (Signed)
Spoke with Thurmond Butts pharmacist from the [facility] regarding the note below. Ryan verbalized fully understanding.  Thanks,

## 2014-10-11 NOTE — Telephone Encounter (Signed)
Amoxicillin refill was sent to [facility] yesterday.

## 2014-10-11 NOTE — Telephone Encounter (Signed)
Left detailed message on vm. Per dpr.

## 2014-10-20 ENCOUNTER — Emergency Department
Admission: EM | Admit: 2014-10-20 | Discharge: 2014-10-20 | Disposition: A | Discharge status: Home / Self Care | Payer: Medicare Other | Attending: Emergency Medicine | Admitting: Emergency Medicine

## 2014-10-20 ENCOUNTER — Encounter: Payer: Self-pay | Admitting: Emergency Medicine

## 2014-10-20 ENCOUNTER — Telehealth: Payer: Self-pay | Admitting: Family Medicine

## 2014-10-20 ENCOUNTER — Emergency Department
Admission: EM | Admit: 2014-10-20 | Discharge: 2014-10-20 | Disposition: A | Discharge status: Home / Self Care | Payer: Medicare Other | Attending: Student | Admitting: Student

## 2014-10-20 DIAGNOSIS — J441 Chronic obstructive pulmonary disease with (acute) exacerbation: Secondary | ICD-10-CM | POA: Diagnosis not present

## 2014-10-20 DIAGNOSIS — Z720 Tobacco use: Secondary | ICD-10-CM | POA: Insufficient documentation

## 2014-10-20 DIAGNOSIS — R1110 Vomiting, unspecified: Secondary | ICD-10-CM | POA: Diagnosis present

## 2014-10-20 DIAGNOSIS — R112 Nausea with vomiting, unspecified: Secondary | ICD-10-CM | POA: Diagnosis not present

## 2014-10-20 DIAGNOSIS — Z79899 Other long term (current) drug therapy: Secondary | ICD-10-CM | POA: Insufficient documentation

## 2014-10-20 DIAGNOSIS — Z7951 Long term (current) use of inhaled steroids: Secondary | ICD-10-CM | POA: Diagnosis not present

## 2014-10-20 DIAGNOSIS — I10 Essential (primary) hypertension: Secondary | ICD-10-CM | POA: Diagnosis not present

## 2014-10-20 DIAGNOSIS — J029 Acute pharyngitis, unspecified: Secondary | ICD-10-CM | POA: Insufficient documentation

## 2014-10-20 DIAGNOSIS — J449 Chronic obstructive pulmonary disease, unspecified: Secondary | ICD-10-CM | POA: Insufficient documentation

## 2014-10-20 DIAGNOSIS — F17210 Nicotine dependence, cigarettes, uncomplicated: Secondary | ICD-10-CM | POA: Diagnosis not present

## 2014-10-20 DIAGNOSIS — Z792 Long term (current) use of antibiotics: Secondary | ICD-10-CM | POA: Diagnosis not present

## 2014-10-20 LAB — COMPREHENSIVE METABOLIC PANEL
ALBUMIN: 3.9 g/dL (ref 3.5–5.0)
ALK PHOS: 88 U/L (ref 38–126)
ALT: 19 U/L (ref 14–54)
ANION GAP: 9 (ref 5–15)
AST: 22 U/L (ref 15–41)
BILIRUBIN TOTAL: 0.6 mg/dL (ref 0.3–1.2)
BUN: 18 mg/dL (ref 6–20)
CO2: 26 mmol/L (ref 22–32)
Calcium: 9.2 mg/dL (ref 8.9–10.3)
Chloride: 102 mmol/L (ref 101–111)
Creatinine, Ser: 0.74 mg/dL (ref 0.44–1.00)
GFR calc Af Amer: >60 mL/min (ref >60)
GFR calc non Af Amer: >60 mL/min (ref >60)
GLUCOSE: 106 mg/dL — AB (ref 65–99)
Potassium: 3.9 mmol/L (ref 3.5–5.1)
Sodium: 137 mmol/L (ref 135–145)
TOTAL PROTEIN: 7.7 g/dL (ref 6.5–8.1)

## 2014-10-20 LAB — CBC WITH DIFFERENTIAL/PLATELET
BASOS PCT: 0 %
Basophils Absolute: 0.1 10*3/uL (ref 0–0.1)
Eosinophils Absolute: 0.3 10*3/uL (ref 0–0.7)
Eosinophils Relative: 2 %
HCT: 46.3 % (ref 35.0–47.0)
HEMOGLOBIN: 15.3 g/dL (ref 12.0–16.0)
LYMPHS PCT: 6 %
Lymphs Abs: 1.2 10*3/uL (ref 1.0–3.6)
MCH: 28.2 pg (ref 26.0–34.0)
MCHC: 33.1 g/dL (ref 32.0–36.0)
MCV: 85.1 fL (ref 80.0–100.0)
MONOS PCT: 5 %
Monocytes Absolute: 0.9 10*3/uL (ref 0.2–0.9)
NEUTROS ABS: 16.3 10*3/uL — AB (ref 1.4–6.5)
NEUTROS PCT: 87 %
Platelets: 202 10*3/uL (ref 150–440)
RBC: 5.44 MIL/uL — ABNORMAL HIGH (ref 3.80–5.20)
RDW: 14.1 % (ref 11.5–14.5)
WBC: 18.7 10*3/uL — ABNORMAL HIGH (ref 3.6–11.0)

## 2014-10-20 LAB — POCT RAPID STREP A: Streptococcus, Group A Screen (Direct): NEGATIVE

## 2014-10-20 LAB — LIPASE, BLOOD: Lipase: 36 U/L (ref 22–51)

## 2014-10-20 MED ORDER — DEXAMETHASONE SODIUM PHOSPHATE 10 MG/ML IJ SOLN
10.0000 mg | Freq: Once | INTRAMUSCULAR | Status: AC
  Administered 2014-10-20: 10 mg via INTRAVENOUS

## 2014-10-20 MED ORDER — MAGIC MOUTHWASH: 5.0000 mL | Freq: Four times a day (QID) | ORAL | Status: DC

## 2014-10-20 MED ORDER — DEXAMETHASONE SODIUM PHOSPHATE 10 MG/ML IJ SOLN
INTRAMUSCULAR | Status: AC
  Administered 2014-10-20: 10 mg via INTRAVENOUS
  Filled 2014-10-20: qty 1

## 2014-10-20 MED ORDER — MORPHINE SULFATE (PF) 4 MG/ML IV SOLN
INTRAVENOUS | Status: AC
  Filled 2014-10-20: qty 1

## 2014-10-20 MED ORDER — MORPHINE SULFATE (PF) 4 MG/ML IV SOLN
4.0000 mg | Freq: Once | INTRAVENOUS | Status: AC
  Administered 2014-10-20: 4 mg via INTRAVENOUS

## 2014-10-20 MED ORDER — ONDANSETRON HCL 4 MG/2ML IJ SOLN
INTRAMUSCULAR | Status: AC
  Administered 2014-10-20: 4 mg via INTRAVENOUS
  Filled 2014-10-20: qty 2

## 2014-10-20 MED ORDER — PROMETHAZINE HCL 25 MG PO TABS: 25.0000 mg | ORAL_TABLET | Freq: Four times a day (QID) | ORAL | Status: DC | PRN

## 2014-10-20 MED ORDER — AMOXICILLIN-POT CLAVULANATE 200-28.5 MG PO CHEW: 1.0000 | CHEWABLE_TABLET | Freq: Two times a day (BID) | ORAL | Status: DC

## 2014-10-20 MED ORDER — SODIUM CHLORIDE 0.9 % IV SOLN
3.0000 g | Freq: Once | INTRAVENOUS | Status: AC
  Administered 2014-10-20: 3 g via INTRAVENOUS

## 2014-10-20 MED ORDER — SODIUM CHLORIDE 0.9 % IV SOLN
1000.0000 mL | Freq: Once | INTRAVENOUS | Status: AC
  Administered 2014-10-20: 1000 mL via INTRAVENOUS

## 2014-10-20 MED ORDER — ONDANSETRON HCL 4 MG/2ML IJ SOLN
4.0000 mg | Freq: Once | INTRAMUSCULAR | Status: AC
  Administered 2014-10-20: 4 mg via INTRAVENOUS

## 2014-10-20 MED ORDER — AMPICILLIN-SULBACTAM SODIUM 3 (2-1) G IJ SOLR
INTRAMUSCULAR | Status: AC
  Administered 2014-10-20: 3 g via INTRAVENOUS
  Filled 2014-10-20: qty 3

## 2014-10-20 NOTE — ED Notes (Signed)
Patient reports waking this morning with throat hurting, feels like she has blisters all over back of her throat and hurts to swallow.

## 2014-10-20 NOTE — ED Notes (Signed)
Pt presents with vomiting after being seen here this am and was dx with pharyngitis. Pt was given magic mouth wash and states she has been vomiting since 3pm.

## 2014-10-20 NOTE — ED Provider Notes (Signed)
[facility] Emergency Department Provider Note  ____________________________________________  Time seen: Approximately 7:34 AM  I have reviewed the triage vital signs and the nursing notes.   HISTORY  Chief Complaint Sore Throat   HPI Andrea [name] is a 49 y.o. female is here with complaint of sore throat. She states it feels sick she has blisters all over his back for throat and is painful to swallow.She denies being around anyone with strep throat. She denies any fever. She is able to swallow fluids without any difficulty. She denies any fever or chills.    Past Medical History  Diagnosis Date  . Arrhythmia     Patient Active Problem List   Diagnosis Date Noted  . COPD exacerbation 10/04/2014  . Anxiety 09/22/2014  . Colon polyp 09/22/2014  . Depression 09/22/2014  . Dyshidrosis 09/22/2014  . GERD (gastroesophageal reflux disease) 09/22/2014  . Insomnia 09/22/2014  . Lung nodule, multiple 09/22/2014  . Panic disorder 09/22/2014  . Chest pain 01/17/2014  . Palpitations 01/17/2014  . Pulmonary nodule 06/10/2013  . Shortness of breath 05/20/2013  . COPD, GOLD B 05/20/2013  . Tobacco abuse 05/20/2013  . Daytime somnolence 05/20/2013  . Discoid lupus 05/30/2012  . Fibromyalgia 02/11/2012  . PVC's (premature ventricular contractions) 01/29/2011  . Essential (primary) hypertension 03/04/1998    Past Surgical History  Procedure Laterality Date  . Cardiac catheterization    . Vaginal hysterectomy  1995    vaginal    Current Outpatient Rx  Name  Route  Sig  Dispense  Refill  . albuterol (PROVENTIL HFA;VENTOLIN HFA) 108 (90 BASE) MCG/ACT inhaler   Inhalation   Inhale 2 puffs into the lungs every 6 (six) hours as needed for wheezing or shortness of breath.         Marland Kitchen albuterol (PROVENTIL) (2.5 MG/3ML) 0.083% nebulizer solution   Nebulization   Take 2.5 mg by nebulization every 4 (four) hours as needed for wheezing or shortness of  breath.         . ALPRAZolam (XANAX) 1 MG tablet      TAKE 1 TABLET BY MOUTH EVERY 8 HOURS AS NEEDED   60 tablet   1     Not to exceed 2 additional fills before 12/10/2014 ...   . Alum & Mag Hydroxide-Simeth (MAGIC MOUTHWASH) SOLN   Oral   Take 5 mLs by mouth 4 (four) times daily.   100 mL   0   . amoxicillin (AMOXIL) 500 MG capsule   Oral   Take 2 capsules (1,000 mg total) by mouth 2 (two) times daily.   40 capsule   0   . ARIPiprazole (ABILIFY) 5 MG tablet   Oral   Take 5 mg by mouth daily.         . budesonide-formoterol (SYMBICORT) 160-4.5 MCG/ACT inhaler   Inhalation   Inhale 1 puff into the lungs. Up to 3 times a day         . clonazePAM (KLONOPIN) 0.5 MG tablet   Oral   Take 0.5 mg by mouth 3 (three) times daily as needed for anxiety.         . DULoxetine (CYMBALTA) 60 MG capsule   Oral   Take 60 mg by mouth daily.         . fluticasone (FLONASE) 50 MCG/ACT nasal spray   Each Nare   Place 2 sprays into both nostrils daily.   16 g   6   . hydroxychloroquine (PLAQUENIL)  200 MG tablet   Oral   Take 400 mg by mouth daily.         . lansoprazole (PREVACID) 30 MG capsule   Oral   Take 30 mg by mouth 2 (two) times daily before a meal.         . nitroGLYCERIN (NITROSTAT) 0.4 MG SL tablet   Sublingual   Place 0.4 mg under the tongue every 5 (five) minutes as needed for chest pain.         . predniSONE (DELTASONE) 20 MG tablet   Oral   Take 1 tablet (20 mg total) by mouth once.   5 tablet   0   . propranolol (INDERAL) 20 MG tablet   Oral   Take 20 mg by mouth 2 (two) times daily.         Marland Kitchen Respiratory Therapy Supplies (FLUTTER) DEVI   Does not apply   1 Device by Does not apply route daily.   1 each   0   . tiotropium (SPIRIVA) 18 MCG inhalation capsule   Inhalation   Place 18 mcg into inhaler and inhale daily.         . TraZODone HCl 150 MG TB24   Oral   Take 150 mg by mouth at bedtime as needed.            Allergies Cefdinir; Doxycycline; and Ivp dye  Family History  Problem Relation Age of Onset  . Emphysema Father   . Asthma Father   . Heart disease Father   . Cancer Father     colon  . Heart attack Father   . Arthritis Father     RA  . Hypertension Mother     Social History Social History  Substance Use Topics  . Smoking status: Current Every Day Smoker -- 1.00 packs/day for 16 years    Types: Cigarettes  . Smokeless tobacco: Never Used     Comment: down to .5ppd/ 05/25/14  . Alcohol Use: No    Review of Systems Constitutional: No fever/chills Eyes: No visual changes. ENT: Positive sore throat. Cardiovascular: Denies chest pain. Respiratory: Denies shortness of breath. Gastrointestinal: No abdominal pain.  No nausea, no vomiting.  Genitourinary: Negative for dysuria. Musculoskeletal: Negative for back pain. Skin: Negative for rash. Neurological: Negative for headaches, focal weakness or numbness.  10-point ROS otherwise negative.  ____________________________________________   PHYSICAL EXAM:  VITAL SIGNS: ED Triage Vitals  Enc Vitals Group     BP --      Pulse --      Resp --      Temp --      Temp src --      SpO2 --      Weight --      Height --      Head Cir --      Peak Flow --      Pain Score --      Pain Loc --      Pain Edu? --      Excl. in [place]? --     Constitutional: Alert and oriented. Well appearing and in no acute distress. Eyes: Conjunctivae are normal. PERRL. EOMI. Head: Atraumatic. Nose: No congestion/rhinnorhea. Mouth/Throat: Mucous membranes are moist.  Oropharynx non-erythematous without exudate. No blisters as described by patient was seen. There is some posterior drainage Neck: No stridor.   Hematological/Lymphatic/Immunilogical: No cervical lymphadenopathy. Cardiovascular: Normal rate, regular rhythm. Grossly normal heart sounds.  Good peripheral circulation. Respiratory: Normal  respiratory effort.  No retractions.  Lungs CTAB. Gastrointestinal: Soft and nontender. No distention. Musculoskeletal: No lower extremity tenderness nor edema.  No joint effusions. Neurologic:  Normal speech and language. No gross focal neurologic deficits are appreciated. No gait instability. Skin:  Skin is warm, dry and intact. No rash noted. Psychiatric: Mood and affect are normal. Speech and behavior are normal.  ____________________________________________   LABS (all labs ordered are listed, but only abnormal results are displayed)  Labs Reviewed  POCT RAPID STREP A   PROCEDURES  Procedure(s) performed: None  Critical Care performed: No  ____________________________________________   INITIAL IMPRESSION / ASSESSMENT AND PLAN / ED COURSE  Pertinent labs & imaging results that were available during my care of the patient were reviewed by me and considered in my medical decision making (see chart for details).  Strep test was negative. Patient was given a prescription for Magic mouthwash for throat comfort measures. She is return to the emergency room if any severe worsening of her symptoms. ____________________________________________   FINAL CLINICAL IMPRESSION(S) / ED DIAGNOSES  Final diagnoses:  [place], PA-C 10/20/14 1632  Joanne Gavel, MD 10/20/14 [phone]

## 2014-10-20 NOTE — ED Provider Notes (Addendum)
[facility] Emergency Department Provider Note     Time seen: ----------------------------------------- 4:43 PM on 10/20/2014 -----------------------------------------    I have reviewed the triage vital signs and the nursing notes.   HISTORY  Chief Complaint Emesis    HPI Andrea [name] is a 49 y.o. female who presents ER with nausea vomiting and inability to keep anything down. Patient is still having a sore throat, was seen here in the ER this morning for sore throat. Patient states she did not have strep throat, was told was probably viral. She states when she took Magic mouthwash and when she got home she started having intractable vomiting. She presents with same.   Past Medical History  Diagnosis Date  . Arrhythmia     Patient Active Problem List   Diagnosis Date Noted  . COPD exacerbation 10/04/2014  . Anxiety 09/22/2014  . Colon polyp 09/22/2014  . Depression 09/22/2014  . Dyshidrosis 09/22/2014  . GERD (gastroesophageal reflux disease) 09/22/2014  . Insomnia 09/22/2014  . Lung nodule, multiple 09/22/2014  . Panic disorder 09/22/2014  . Chest pain 01/17/2014  . Palpitations 01/17/2014  . Pulmonary nodule 06/10/2013  . Shortness of breath 05/20/2013  . COPD, GOLD B 05/20/2013  . Tobacco abuse 05/20/2013  . Daytime somnolence 05/20/2013  . Discoid lupus 05/30/2012  . Fibromyalgia 02/11/2012  . PVC's (premature ventricular contractions) 01/29/2011  . Essential (primary) hypertension 03/04/1998    Past Surgical History  Procedure Laterality Date  . Cardiac catheterization    . Vaginal hysterectomy  1995    vaginal    Allergies Cefdinir; Doxycycline; and Ivp dye  Social History Social History  Substance Use Topics  . Smoking status: Current Every Day Smoker -- 1.00 packs/day for 16 years    Types: Cigarettes  . Smokeless tobacco: Never Used     Comment: down to .5ppd/ 05/25/14  . Alcohol Use: No    Review of  Systems Constitutional: Negative for fever. Eyes: Negative for visual changes. ENT: Positive for sore throat Cardiovascular: Negative for chest pain. Respiratory: Negative for shortness of breath. Gastrointestinal: Positive for vomiting, negative for abdominal pain Genitourinary: Negative for dysuria. Musculoskeletal: Negative for back pain. Skin: Negative for rash. Neurological: Negative for headaches, focal weakness or numbness.  10-point ROS otherwise negative.  ____________________________________________   PHYSICAL EXAM:  VITAL SIGNS: ED Triage Vitals  Enc Vitals Group     BP 10/20/14 1555 134/90 mmHg     Pulse Rate 10/20/14 1555 101     Resp 10/20/14 1555 20     Temp 10/20/14 1555 98.2 F (36.8 C)     Temp Source 10/20/14 1555 Oral     SpO2 10/20/14 1555 96 %     Weight 10/20/14 1555 150 lb (68.04 kg)     Height 10/20/14 1555 5\' 3"  (1.6 m)     Head Cir --      Peak Flow --      Pain Score --      Pain Loc --      Pain Edu? --      Excl. in [place]? --     Constitutional: Alert and oriented. Well appearing and in no distress. Eyes: Conjunctivae are normal. PERRL. Normal extraocular movements. ENT   Head: Normocephalic and atraumatic.   Nose: No congestion/rhinnorhea.   Mouth/Throat: Mucous membranes are moist.   Neck: No stridor. Cardiovascular: Normal rate, regular rhythm. Normal and symmetric distal pulses are present in all extremities. No murmurs, rubs, or gallops. Respiratory:  Normal respiratory effort without tachypnea nor retractions. Breath sounds are clear and equal bilaterally. No wheezes/rales/rhonchi. Gastrointestinal: Soft and nontender. No distention. No abdominal bruits.  Musculoskeletal: Nontender with normal range of motion in all extremities. No joint effusions.  No lower extremity tenderness nor edema. Neurologic:  Normal speech and language. No gross focal neurologic deficits are appreciated. Speech is normal. No gait  instability. Skin:  Skin is warm, dry and intact. No rash noted. Psychiatric: Mood and affect are normal. Speech and behavior are normal. Patient exhibits appropriate insight and judgment.  ____________________________________________  ED COURSE:  Pertinent labs & imaging results that were available during my care of the patient were reviewed by me and considered in my medical decision making (see chart for details). Patient will receive IV fluid bolus, IV Zofran and reevaluation. Likely also given Decadron for sore throat ____________________________________________    LABS (pertinent positives/negatives)  Labs Reviewed  CBC WITH DIFFERENTIAL/PLATELET - Abnormal; Notable for the following:    WBC 18.7 (*)    RBC 5.44 (*)    Neutro Abs 16.3 (*)    All other components within normal limits  COMPREHENSIVE METABOLIC PANEL - Abnormal; Notable for the following:    Glucose, Bld 106 (*)    All other components within normal limits  LIPASE, BLOOD    ____________________________________________  FINAL ASSESSMENT AND PLAN  Pharyngitis, vomiting  Plan: Patient with labs as dictated above. White blood cell count was surprisingly elevated, she was given IV Unasyn for this. She is feeling better after the above medications. Will be discharged with antiemetics, antibiotics and close follow-up.   Earleen Newport, MD   Earleen Newport, MD 10/20/14 [place], MD 10/20/14 [phone]

## 2014-10-20 NOTE — Discharge Instructions (Signed)
FOLLOW UP WITH YOUR DOCTOR ABOUT YOUR CONTINUED COPD MAGIC MOUTH [facility] FOR THROAT DISCOMFORT

## 2014-10-20 NOTE — Discharge Instructions (Signed)
Nausea and Vomiting Nausea is a sick feeling that often comes before throwing up (vomiting). Vomiting is a reflex where stomach contents come out of your mouth. Vomiting can cause severe loss of body fluids (dehydration). Children and elderly adults can become dehydrated quickly, especially if they also have diarrhea. Nausea and vomiting are symptoms of a condition or disease. It is important to find the cause of your symptoms. CAUSES   Direct irritation of the stomach lining. This irritation can result from increased acid production (gastroesophageal reflux disease), infection, food poisoning, taking certain medicines (such as nonsteroidal anti-inflammatory drugs), alcohol use, or tobacco use.  Signals from the brain.These signals could be caused by a headache, heat exposure, an inner ear disturbance, increased pressure in the brain from injury, infection, a tumor, or a concussion, pain, emotional stimulus, or metabolic problems.  An obstruction in the gastrointestinal tract (bowel obstruction).  Illnesses such as diabetes, hepatitis, gallbladder problems, appendicitis, kidney problems, cancer, sepsis, atypical symptoms of a heart attack, or eating disorders.  Medical treatments such as chemotherapy and radiation.  Receiving medicine that makes you sleep (general anesthetic) during surgery. DIAGNOSIS Your caregiver may ask for tests to be done if the problems do not improve after a few days. Tests may also be done if symptoms are severe or if the reason for the nausea and vomiting is not clear. Tests may include:  Urine tests.  Blood tests.  Stool tests.  Cultures (to look for evidence of infection).  X-rays or other imaging studies. Test results can help your caregiver make decisions about treatment or the need for additional tests. TREATMENT You need to stay well hydrated. Drink frequently but in small amounts.You may wish to drink water, sports drinks, clear broth, or eat frozen  ice pops or gelatin dessert to help stay hydrated.When you eat, eating slowly may help prevent nausea.There are also some antinausea medicines that may help prevent nausea. HOME CARE INSTRUCTIONS   Take all medicine as directed by your caregiver.  If you do not have an appetite, do not force yourself to eat. However, you must continue to drink fluids.  If you have an appetite, eat a normal diet unless your caregiver tells you differently.  Eat a variety of complex carbohydrates (rice, wheat, potatoes, bread), lean meats, yogurt, fruits, and vegetables.  Avoid high-fat foods because they are more difficult to digest.  Drink enough water and fluids to keep your urine clear or pale yellow.  If you are dehydrated, ask your caregiver for specific rehydration instructions. Signs of dehydration may include:  Severe thirst.  Dry lips and mouth.  Dizziness.  Dark urine.  Decreasing urine frequency and amount.  Confusion.  Rapid breathing or pulse. SEEK IMMEDIATE MEDICAL CARE IF:   You have blood or brown flecks (like coffee grounds) in your vomit.  You have black or bloody stools.  You have a severe headache or stiff neck.  You are confused.  You have severe abdominal pain.  You have chest pain or trouble breathing.  You do not urinate at least once every 8 hours.  You develop cold or clammy skin.  You continue to vomit for longer than 24 to 48 hours.  You have a fever. MAKE SURE YOU:   Understand these instructions.  Will watch your condition.  Will get help right away if you are not doing well or get worse. Document Released: 02/18/2005 Document Revised: 05/13/2011 Document Reviewed: 07/18/2010 [employer] Patient Information 2015 [employer]. This information is not intended   to replace advice given to you by your health care provider. Make sure you discuss any questions you have with your health care provider.  Pharyngitis Pharyngitis is redness, pain, and  swelling (inflammation) of your pharynx.  CAUSES  Pharyngitis is usually caused by infection. Most of the time, these infections are from viruses (viral) and are part of a cold. However, sometimes pharyngitis is caused by bacteria (bacterial). Pharyngitis can also be caused by allergies. Viral pharyngitis may be spread from person to person by coughing, sneezing, and personal items or utensils (cups, forks, spoons, toothbrushes). Bacterial pharyngitis may be spread from person to person by more intimate contact, such as kissing.  SIGNS AND SYMPTOMS  Symptoms of pharyngitis include:   Sore throat.   Tiredness (fatigue).   Low-grade fever.   Headache.  Joint pain and muscle aches.  Skin rashes.  Swollen lymph nodes.  Plaque-like film on throat or tonsils (often seen with bacterial pharyngitis). DIAGNOSIS  Your health care provider will ask you questions about your illness and your symptoms. Your medical history, along with a physical exam, is often all that is needed to diagnose pharyngitis. Sometimes, a rapid strep test is done. Other lab tests may also be done, depending on the suspected cause.  TREATMENT  Viral pharyngitis will usually get better in 3-4 days without the use of medicine. Bacterial pharyngitis is treated with medicines that kill germs (antibiotics).  HOME CARE INSTRUCTIONS   Drink enough water and fluids to keep your urine clear or pale yellow.   Only take over-the-counter or prescription medicines as directed by your health care provider:   If you are prescribed antibiotics, make sure you finish them even if you start to feel better.   Do not take aspirin.   Get lots of rest.   Gargle with 8 oz of salt water ( tsp of salt per 1 qt of water) as often as every 1-2 hours to soothe your throat.   Throat lozenges (if you are not at risk for choking) or sprays may be used to soothe your throat. SEEK MEDICAL CARE IF:   You have large, tender lumps in  your neck.  You have a rash.  You cough up green, yellow-brown, or bloody spit. SEEK IMMEDIATE MEDICAL CARE IF:   Your neck becomes stiff.  You drool or are unable to swallow liquids.  You vomit or are unable to keep medicines or liquids down.  You have severe pain that does not go away with the use of recommended medicines.  You have trouble breathing (not caused by a stuffy nose). MAKE SURE YOU:   Understand these instructions.  Will watch your condition.  Will get help right away if you are not doing well or get worse. Document Released: 02/18/2005 Document Revised: 12/09/2012 Document Reviewed: 10/26/2012 [facility] Patient Information 2015 [place], [place]. This information is not intended to replace advice given to you by your health care provider. Make sure you discuss any questions you have with your health care provider.

## 2014-10-20 NOTE — ED Notes (Signed)
Pt seen in the ER this morning for a sore throat.  Pt was prescribed magic mouthwash, since pt has vomited x 8.  Pt continues to have n/v.  No abd pain.  md at bedside.

## 2014-10-20 NOTE — Telephone Encounter (Signed)
Pr was discharged from [facility] ER today for Pharyngitis.  I have scheduled appt for Tuesday/MW

## 2014-10-20 NOTE — ED Notes (Signed)
Pt states she is feeling better.  Iv fluids infusing.

## 2014-10-21 MED ORDER — AMOXICILLIN-POT CLAVULANATE 875-125 MG PO TABS: 1.0000 | ORAL_TABLET | Freq: Two times a day (BID) | ORAL | Status: AC

## 2014-10-21 NOTE — ED Provider Notes (Signed)
-----------------------------------------   8:57 AM on 10/21/2014 -----------------------------------------  The patient came back to the emergency department with her prescription which was for Augmentin 200-28.5 mg chewable tablets twice daily for 10 days.  No pharmacy has the chewable version and she states that she does not need the chewable once anyway.  I do not understand the dosing which is less than I would anticipate for her complaints and symptoms.  I changed the prescription to a more standard Augmentin 875 by mouth twice a day 10 days and provided a new prescription and kept the old one which was shredded.  Hinda Kehr, MD 10/21/14 [phone]

## 2014-10-23 ENCOUNTER — Emergency Department: Payer: Medicare Other

## 2014-10-23 ENCOUNTER — Encounter: Payer: Self-pay | Admitting: Emergency Medicine

## 2014-10-23 ENCOUNTER — Emergency Department
Admission: EM | Admit: 2014-10-23 | Discharge: 2014-10-23 | Disposition: A | Discharge status: Home / Self Care | Payer: Medicare Other | Attending: Emergency Medicine | Admitting: Emergency Medicine

## 2014-10-23 DIAGNOSIS — J449 Chronic obstructive pulmonary disease, unspecified: Secondary | ICD-10-CM | POA: Diagnosis not present

## 2014-10-23 DIAGNOSIS — Z79899 Other long term (current) drug therapy: Secondary | ICD-10-CM | POA: Diagnosis not present

## 2014-10-23 DIAGNOSIS — Z720 Tobacco use: Secondary | ICD-10-CM | POA: Insufficient documentation

## 2014-10-23 DIAGNOSIS — I10 Essential (primary) hypertension: Secondary | ICD-10-CM | POA: Insufficient documentation

## 2014-10-23 DIAGNOSIS — R197 Diarrhea, unspecified: Secondary | ICD-10-CM | POA: Insufficient documentation

## 2014-10-23 DIAGNOSIS — R631 Polydipsia: Secondary | ICD-10-CM | POA: Insufficient documentation

## 2014-10-23 DIAGNOSIS — R05 Cough: Secondary | ICD-10-CM | POA: Diagnosis not present

## 2014-10-23 LAB — CBC WITH DIFFERENTIAL/PLATELET
BASOS ABS: 0.1 10*3/uL (ref 0–0.1)
Basophils Relative: 1 %
EOS PCT: 3 %
Eosinophils Absolute: 0.3 10*3/uL (ref 0–0.7)
HEMATOCRIT: 42.4 % (ref 35.0–47.0)
Hemoglobin: 14.3 g/dL (ref 12.0–16.0)
Lymphocytes Relative: 28 %
Lymphs Abs: 3 10*3/uL (ref 1.0–3.6)
MCH: 28.5 pg (ref 26.0–34.0)
MCHC: 33.6 g/dL (ref 32.0–36.0)
MCV: 84.9 fL (ref 80.0–100.0)
MONO ABS: 1.1 10*3/uL — AB (ref 0.2–0.9)
MONOS PCT: 10 %
NEUTROS ABS: 6.4 10*3/uL (ref 1.4–6.5)
Neutrophils Relative %: 58 %
PLATELETS: 204 10*3/uL (ref 150–440)
RBC: 5 MIL/uL (ref 3.80–5.20)
RDW: 14 % (ref 11.5–14.5)
WBC: 10.9 10*3/uL (ref 3.6–11.0)

## 2014-10-23 LAB — COMPREHENSIVE METABOLIC PANEL
ALK PHOS: 87 U/L (ref 38–126)
ALT: 20 U/L (ref 14–54)
ANION GAP: 7 (ref 5–15)
AST: 20 U/L (ref 15–41)
Albumin: 3.3 g/dL — ABNORMAL LOW (ref 3.5–5.0)
BILIRUBIN TOTAL: 0.4 mg/dL (ref 0.3–1.2)
BUN: 15 mg/dL (ref 6–20)
CALCIUM: 8.8 mg/dL — AB (ref 8.9–10.3)
CO2: 27 mmol/L (ref 22–32)
Chloride: 106 mmol/L (ref 101–111)
Creatinine, Ser: 0.68 mg/dL (ref 0.44–1.00)
GFR calc non Af Amer: >60 mL/min (ref >60)
Glucose, Bld: 86 mg/dL (ref 65–99)
POTASSIUM: 4.1 mmol/L (ref 3.5–5.1)
Sodium: 140 mmol/L (ref 135–145)
TOTAL PROTEIN: 6.7 g/dL (ref 6.5–8.1)

## 2014-10-23 LAB — LIPASE, BLOOD: Lipase: 32 U/L (ref 22–51)

## 2014-10-23 MED ORDER — SODIUM CHLORIDE 0.9 % IV BOLUS (SEPSIS)
1000.0000 mL | Freq: Once | INTRAVENOUS | Status: AC
  Administered 2014-10-23: 1000 mL via INTRAVENOUS

## 2014-10-23 NOTE — ED Notes (Signed)
Pt given water for PO challenge 

## 2014-10-23 NOTE — ED Notes (Signed)
MD at bedside. 

## 2014-10-23 NOTE — ED Notes (Signed)
Pt states she is currently on abx which is causing GI distress with apx 18 episodes of diarrhea yesterday. Recently treated for "mouth sores" per pt report. Pt states called pharmacy and advised to not take imodium OTC with current abx. Currently taking Augmentin. Pt in NAD at this time. No sore visualized within mouth. Pt c/o of enlarged and sore left lymph node but no significant enlargement noted.

## 2014-10-23 NOTE — ED Provider Notes (Signed)
[facility] Emergency Department Provider Note   ____________________________________________  Time seen: 8 I have reviewed the triage vital signs and the triage nursing note.  HISTORY  Chief Complaint Diarrhea   Historian Patient  HPI Andrea [name] is a 49 y.o. female who is currently on Augmentin for an illness consisting of cough, vomiting, sore throat and an elevated white blood cell count 2 days ago when she was seen in the emergency department. She came to the ED today because since yesterday she's had a lot of watery diarrhea, up to 18 times. Nonbloody. No fever, however she is reporting hot flashes. No skin rash. Sore throat is improved. Patient still has a cough, and did not have an x-ray on the evaluation 2 days ago. No abdominal pain. No dysuria.   Past Medical History  Diagnosis Date  . Arrhythmia     Patient Active Problem List   Diagnosis Date Noted  . COPD exacerbation 10/04/2014  . Anxiety 09/22/2014  . [name] polyp 09/22/2014  . Depression 09/22/2014  . Dyshidrosis 09/22/2014  . GERD (gastroesophageal reflux disease) 09/22/2014  . Insomnia 09/22/2014  . Lung nodule, multiple 09/22/2014  . Panic disorder 09/22/2014  . Chest pain 01/17/2014  . Palpitations 01/17/2014  . Pulmonary nodule 06/10/2013  . Shortness of breath 05/20/2013  . COPD, GOLD B 05/20/2013  . Tobacco abuse 05/20/2013  . Daytime somnolence 05/20/2013  . Discoid lupus 05/30/2012  . Fibromyalgia 02/11/2012  . PVC's (premature ventricular contractions) 01/29/2011  . Essential (primary) hypertension 03/04/1998    Past Surgical History  Procedure Laterality Date  . Cardiac catheterization    . Vaginal hysterectomy  1995    vaginal    Current Outpatient Rx  Name  Route  Sig  Dispense  Refill  . albuterol (PROVENTIL HFA;VENTOLIN HFA) 108 (90 BASE) MCG/ACT inhaler   Inhalation   Inhale 2 puffs into the lungs every 6 (six) hours as needed for wheezing or  shortness of breath.         Marland Kitchen albuterol (PROVENTIL) (2.5 MG/3ML) 0.083% nebulizer solution   Nebulization   Take 2.5 mg by nebulization every 4 (four) hours as needed for wheezing or shortness of breath.         . ALPRAZolam (XANAX) 1 MG tablet      TAKE 1 TABLET BY MOUTH EVERY 8 HOURS AS NEEDED   60 tablet   1     Not to exceed 2 additional fills before 12/10/2014 ...   . Alum & Mag Hydroxide-Simeth (MAGIC MOUTHWASH) SOLN   Oral   Take 5 mLs by mouth 4 (four) times daily.   100 mL   0   . amoxicillin-clavulanate (AUGMENTIN) 875-125 MG per tablet   Oral   Take 1 tablet by mouth 2 (two) times daily.   20 tablet   0   . ARIPiprazole (ABILIFY) 5 MG tablet   Oral   Take 5 mg by mouth daily.         . budesonide-formoterol (SYMBICORT) 160-4.5 MCG/ACT inhaler   Inhalation   Inhale 1 puff into the lungs. Up to 3 times a day         . clonazePAM (KLONOPIN) 0.5 MG tablet   Oral   Take 0.5 mg by mouth 3 (three) times daily as needed for anxiety.         . DULoxetine (CYMBALTA) 60 MG capsule   Oral   Take 60 mg by mouth daily.         Marland Kitchen  fluticasone (FLONASE) 50 MCG/ACT nasal spray   Each Nare   Place 2 sprays into both nostrils daily.   16 g   6   . hydroxychloroquine (PLAQUENIL) 200 MG tablet   Oral   Take 400 mg by mouth daily.         . lansoprazole (PREVACID) 30 MG capsule   Oral   Take 30 mg by mouth 2 (two) times daily before a meal.         . nitroGLYCERIN (NITROSTAT) 0.4 MG SL tablet   Sublingual   Place 0.4 mg under the tongue every 5 (five) minutes as needed for chest pain.         . predniSONE (DELTASONE) 20 MG tablet   Oral   Take 1 tablet (20 mg total) by mouth once.   5 tablet   0   . promethazine (PHENERGAN) 25 MG tablet   Oral   Take 1 tablet (25 mg total) by mouth every 6 (six) hours as needed for nausea or vomiting.   20 tablet   0   . propranolol (INDERAL) 20 MG tablet   Oral   Take 20 mg by mouth 2 (two) times  daily.         Marland Kitchen Respiratory Therapy Supplies (FLUTTER) DEVI   Does not apply   1 Device by Does not apply route daily.   1 each   0   . tiotropium (SPIRIVA) 18 MCG inhalation capsule   Inhalation   Place 18 mcg into inhaler and inhale daily.         . TraZODone HCl 150 MG TB24   Oral   Take 150 mg by mouth at bedtime as needed.           Allergies Cefdinir; Doxycycline; and Ivp dye  Family History  Problem Relation Age of Onset  . Emphysema Father   . Asthma Father   . Heart disease Father   . Cancer Father     [name]  . Heart attack Father   . Arthritis Father     RA  . Hypertension Mother     Social History Social History  Substance Use Topics  . Smoking status: Current Every Day Smoker -- 1.00 packs/day for 16 years    Types: Cigarettes  . Smokeless tobacco: Never Used     Comment: down to .5ppd/ 05/25/14  . Alcohol Use: No    Review of Systems  Constitutional: Negative for fever. Positive for dry mouth and increased thirst Eyes: Negative for visual changes. ENT: Negative for sore throat. Cardiovascular: Negative for chest pain. Respiratory: Negative for shortness of breath. Gastrointestinal: Vomiting has now subsided. No nausea now. Genitourinary: Negative for dysuria. Musculoskeletal: Negative for back pain. Skin: Negative for rash. Neurological: Negative for headaches, focal weakness or numbness. 10 point Review of Systems otherwise negative ____________________________________________   PHYSICAL EXAM:  VITAL SIGNS: ED Triage Vitals  Enc Vitals Group     BP 10/23/14 0752 121/69 mmHg     Pulse Rate 10/23/14 0752 96     Resp 10/23/14 0752 20     Temp 10/23/14 0752 97.9 F (36.6 C)     Temp Source 10/23/14 0752 Oral     SpO2 10/23/14 0752 97 %     Weight 10/23/14 0752 150 lb (68.04 kg)     Height 10/23/14 0752 5\' 3"  (1.6 m)     Head Cir --      Peak Flow --  Pain Score --      Pain Loc --      Pain Edu? --      Excl. in [place]?  --      Constitutional: Alert and oriented. Well appearing and in no distress. Eyes: Conjunctivae are normal. PERRL. Normal extraocular movements. ENT   Head: Normocephalic and atraumatic.   Nose: No congestion/rhinnorhea.   Mouth/Throat: Mucous membranes are moderately dry..   Neck: No stridor. Cardiovascular/Chest: Normal rate, regular rhythm.  No murmurs, rubs, or gallops. Respiratory: Normal respiratory effort without tachypnea nor retractions. Breath sounds are clear and equal bilaterally. No wheezes/rales/rhonchi. Gastrointestinal: Soft. No distention, no guarding, no rebound. Nontender   Genitourinary/rectal:Deferred Musculoskeletal: Nontender with normal range of motion in all extremities. No joint effusions.  No lower extremity tenderness nor edema. Neurologic:  Normal speech and language. No gross or focal neurologic deficits are appreciated. Skin:  Skin is warm, dry and intact. No rash noted. Psychiatric: Mood and affect are normal. Speech and behavior are normal. Patient exhibits appropriate insight and judgment.  ____________________________________________   EKG I, Lisa Roca, MD, the attending physician have personally viewed and interpreted all ECGs.  No EKG performed ____________________________________________  LABS (pertinent positives/negatives)  CBC shows a normal white blood count of 10.9, no left shift, hemoglobin 14.3, and platelet 204 Lipase 32 Complete metabolic panel showing no significant abnormalities including normal electrolytes and normal BUN and creatinine   ____________________________________________  RADIOLOGY All Xrays were viewed by me. Imaging interpreted by Radiologist.  Chest x-ray two-view: Negative __________________________________________  PROCEDURES  Procedure(s) performed: None Critical Care performed: None  ____________________________________________   ED COURSE / ASSESSMENT AND PLAN  CONSULTATIONS:  None  Pertinent labs & imaging results that were available during my care of the patient were reviewed by me and considered in my medical decision making (see chart for details).  Patient's exam and evaluation are reassuring. There are no signs of kidney failure likely on values. Her white blood cell count left shift have resolved. Nausea for diarrhea is due to what might of been a virus causing initially vomiting and now diarrhea, or is due to the Augmentin side effect, or is a combination of both these factors. I did ask her to increase probiotics, and continued to make sure she remains hydrated.  Have follow-up up with her PCP closely within 1-2 days.   Patient / Family / Caregiver informed of clinical course, medical decision-making process, and agree with plan.   I discussed return precautions, follow-up instructions, and discharged instructions with patient and/or family.  ___________________________________________   FINAL CLINICAL IMPRESSION(S) / ED DIAGNOSES   Final diagnoses:  Diarrhea    FOLLOW UP  Referred to: Dr. Caryn Section, primary care physician, 1-2 days.   Lisa Roca, MD 10/23/14 [phone]

## 2014-10-23 NOTE — Discharge Instructions (Signed)
You were evaluated for diarrhea, and your exam and evaluation are reassuring. I'm not sure if the diarrhea is from a viral illness that had previously been causing you vomiting and is now running its course as diarrhea, or whether or not it is due to side effect of the Augmentin antibiotic, however these may be occurring together. Make sure you replacing with plenty of fluids including water or electrolyte drinks such as Gatorade.  You need to be seen by your primary doctor in 1-2 days. Return to the emergency department for any new or worsening condition including fever, trouble breathing, dizziness or passing out, altered mental status/confusion, abdominal pain, worsening diarrhea or concern for dehydration, black or bloody stools, or any other symptoms concerning to you.   Diarrhea Diarrhea is frequent loose and watery bowel movements. It can cause you to feel weak and dehydrated. Dehydration can cause you to become tired and thirsty, have a dry mouth, and have decreased urination that often is dark yellow. Diarrhea is a sign of another problem, most often an infection that will not last long. In most cases, diarrhea typically lasts 2-3 days. However, it can last longer if it is a sign of something more serious. It is important to treat your diarrhea as directed by your caregiver to lessen or prevent future episodes of diarrhea. CAUSES  Some common causes include:  Gastrointestinal infections caused by viruses, bacteria, or parasites.  Food poisoning or food allergies.  Certain medicines, such as antibiotics, chemotherapy, and laxatives.  Artificial sweeteners and fructose.  Digestive disorders. HOME CARE INSTRUCTIONS  Ensure adequate fluid intake (hydration): Have 1 cup (8 oz) of fluid for each diarrhea episode. Avoid fluids that contain simple sugars or sports drinks, fruit juices, whole milk products, and sodas. Your urine should be clear or pale yellow if you are drinking enough fluids.  Hydrate with an oral rehydration solution that you can purchase at pharmacies, retail stores, and online. You can prepare an oral rehydration solution at home by mixing the following ingredients together:   - tsp table salt.   tsp baking soda.   tsp salt substitute containing potassium chloride.  1  tablespoons sugar.  1 L (34 oz) of water.  Certain foods and beverages may increase the speed at which food moves through the gastrointestinal (GI) tract. These foods and beverages should be avoided and include:  Caffeinated and alcoholic beverages.  High-fiber foods, such as raw fruits and vegetables, nuts, seeds, and whole grain breads and cereals.  Foods and beverages sweetened with sugar alcohols, such as xylitol, sorbitol, and mannitol.  Some foods may be well tolerated and may help thicken stool including:  Starchy foods, such as rice, toast, pasta, low-sugar cereal, oatmeal, grits, baked potatoes, crackers, and bagels.  Bananas.  Applesauce.  Add probiotic-rich foods to help increase healthy bacteria in the GI tract, such as yogurt and fermented milk products.  Wash your hands well after each diarrhea episode.  Only take over-the-counter or prescription medicines as directed by your caregiver.  Take a warm bath to relieve any burning or pain from frequent diarrhea episodes. SEEK IMMEDIATE MEDICAL CARE IF:   You are unable to keep fluids down.  You have persistent vomiting.  You have blood in your stool, or your stools are black and tarry.  You do not urinate in 6-8 hours, or there is only a small amount of very dark urine.  You have abdominal pain that increases or localizes.  You have weakness, dizziness, confusion, or  light-headedness.  You have a severe headache.  Your diarrhea gets worse or does not get better.  You have a fever or persistent symptoms for more than 2-3 days.  You have a fever and your symptoms suddenly get worse. MAKE SURE YOU:    Understand these instructions.  Will watch your condition.  Will get help right away if you are not doing well or get worse. Document Released: 02/08/2002 Document Revised: 07/05/2013 Document Reviewed: 10/27/2011 [facility] Patient Information 2015 [place], [place]. This information is not intended to replace advice given to you by your health care provider. Make sure you discuss any questions you have with your health care provider.

## 2014-10-23 NOTE — ED Notes (Signed)
Patient to ED with report of being here twice in last week, first time was treated for Pharyngitis, the second time for an allergic reaction to a medication. Today comes in with c/o diarrhea all day yesterday.

## 2014-10-25 ENCOUNTER — Encounter: Payer: Self-pay | Admitting: Family Medicine

## 2014-10-25 ENCOUNTER — Ambulatory Visit (INDEPENDENT_AMBULATORY_CARE_PROVIDER_SITE_OTHER): Payer: Medicare Other | Admitting: Family Medicine

## 2014-10-25 VITALS — BP 110/78 | HR 87 | Temp 98.3°F | Resp 16 | Wt 150.0 lb

## 2014-10-25 DIAGNOSIS — J0110 Acute frontal sinusitis, unspecified: Secondary | ICD-10-CM | POA: Diagnosis not present

## 2014-10-25 DIAGNOSIS — J029 Acute pharyngitis, unspecified: Secondary | ICD-10-CM | POA: Diagnosis not present

## 2014-10-25 DIAGNOSIS — R197 Diarrhea, unspecified: Secondary | ICD-10-CM | POA: Diagnosis not present

## 2014-10-25 NOTE — Progress Notes (Signed)
Patient: Andrea [name] Female    DOB: 26-Nov-1965   49 y.o.   MRN: [id number] Visit Date: 10/25/2014  Today's Provider: Lelon Huh, MD   No chief complaint on file.  Subjective:    HPI   Follow up Hospitalization  Patient was seen on 10/20/2014 for Pharyngitis and then again in the evening of 10/20/2014 for allergic reaction to [facility]. At her initial visit she had negative strep test. When she returned later that night for reaction to mouthwash she was found to have elevated WBC of 18.7 with elevated neutrophils. , she was given IV Unasyn and prescription for Augmentin.   She returned to ER 7-21 for severe diarrhea possible due to antibiotics. She was advised to continue abx since her white cell count had normalized, and told to start taking probiotics. She states BMs are much better, but still not back to normal consistency.   She was seen in this office 09/22/14 for ear pain and sinusitis she was prescribed amoxicillin and fluticasone nasal spray. She called back 10/10/14 requesting another round of abx because her sinus drainage had not completely resolve. She states it never cleared up completely is very concerned she may have something wrong with her sinuses.     ------------------------------------------------------------------------------------   Allergies  Allergen Reactions  . Cefdinir     tachycardia  . Doxycycline     Nausea, migraine  . Ivp Dye [Iodinated Diagnostic Agents]     Heart palpitations   Previous Medications   ALBUTEROL (PROVENTIL HFA;VENTOLIN HFA) 108 (90 BASE) MCG/ACT INHALER    Inhale 2 puffs into the lungs every 6 (six) hours as needed for wheezing or shortness of breath.   ALBUTEROL (PROVENTIL) (2.5 MG/3ML) 0.083% NEBULIZER SOLUTION    Take 2.5 mg by nebulization every 4 (four) hours as needed for wheezing or shortness of breath.   ALPRAZOLAM (XANAX) 1 MG TABLET    TAKE 1 TABLET BY MOUTH EVERY 8 HOURS AS NEEDED   ALUM & MAG  HYDROXIDE-SIMETH (MAGIC MOUTHWASH) SOLN    Take 5 mLs by mouth 4 (four) times daily.   AMOXICILLIN-CLAVULANATE (AUGMENTIN) 875-125 MG PER TABLET    Take 1 tablet by mouth 2 (two) times daily.   ARIPIPRAZOLE (ABILIFY) 5 MG TABLET    Take 5 mg by mouth daily.   BUDESONIDE-FORMOTEROL (SYMBICORT) 160-4.5 MCG/ACT INHALER    Inhale 1 puff into the lungs. Up to 3 times a day   CLONAZEPAM (KLONOPIN) 0.5 MG TABLET    Take 0.5 mg by mouth 3 (three) times daily as needed for anxiety.   DULOXETINE (CYMBALTA) 60 MG CAPSULE    Take 60 mg by mouth daily.   FLUTICASONE (FLONASE) 50 MCG/ACT NASAL SPRAY    Place 2 sprays into both nostrils daily.   HYDROXYCHLOROQUINE (PLAQUENIL) 200 MG TABLET    Take 400 mg by mouth daily.   LANSOPRAZOLE (PREVACID) 30 MG CAPSULE    Take 30 mg by mouth 2 (two) times daily before a meal.   NITROGLYCERIN (NITROSTAT) 0.4 MG SL TABLET    Place 0.4 mg under the tongue every 5 (five) minutes as needed for chest pain.   PREDNISONE (DELTASONE) 20 MG TABLET    Take 1 tablet (20 mg total) by mouth once.   PROMETHAZINE (PHENERGAN) 25 MG TABLET    Take 1 tablet (25 mg total) by mouth every 6 (six) hours as needed for nausea or vomiting.   PROPRANOLOL (INDERAL) 20 MG TABLET  Take 20 mg by mouth 2 (two) times daily.   RESPIRATORY THERAPY SUPPLIES (FLUTTER) DEVI    1 Device by Does not apply route daily.   TIOTROPIUM (SPIRIVA) 18 MCG INHALATION CAPSULE    Place 18 mcg into inhaler and inhale daily.   TRAZODONE HCL 150 MG TB24    Take 150 mg by mouth at bedtime as needed.    Review of Systems  HENT: Positive for postnasal drip. Negative for mouth sores.   Respiratory: Positive for cough and shortness of breath.   Cardiovascular: Negative for chest pain and palpitations.  Neurological: Positive for light-headedness. Negative for dizziness and headaches.    Social History  Substance Use Topics  . Smoking status: Current Every Day Smoker -- 1.00 packs/day for 16 years    Types: Cigarettes    . Smokeless tobacco: Never Used     Comment: down to .5ppd/ 05/25/14  . Alcohol Use: No   Objective:   There were no vitals taken for this visit.  Physical Exam  General Appearance:    Alert, cooperative, no distress  HENT:   bilateral TM normal without fluid or infection, neck has bilateral anterior cervical nodes enlarged, pharynx erythematous without exudate, sinuses nontender, post nasal drip noted and nasal mucosa congested  Eyes:    PERRL, conjunctiva/corneas clear, EOM's intact       Lungs:     Clear to auscultation bilaterally, respirations unlabored  Heart:    Regular rate and rhythm  Neurologic:   Awake, alert, oriented x 3. No apparent focal neurological           defect.           Assessment & Plan:     1. Pharyngitis Greatly improved since ER visit when given Unasyn and put on augmentin last week.   2. Diarrhea Probably secondary to Augmentin. Has greatly improved on probiotics. Advised her to complete 7 days of Augmentin and continue probiotics for 3-4 days more.   3. Acute frontal sinusitis, recurrence not specified Expect resolution with Augmentin. She is very concerned about underlying sinus disease. Advised her to call back for ENT referral if sx not resolved when finished with antibiotic.        Lelon Huh, MD  [facility]

## 2014-10-27 ENCOUNTER — Telehealth: Payer: Self-pay | Admitting: Family Medicine

## 2014-10-27 DIAGNOSIS — J3489 Other specified disorders of nose and nasal sinuses: Secondary | ICD-10-CM

## 2014-10-27 NOTE — Telephone Encounter (Signed)
Pt states she is still having sinus congestion and is requesting a referral with [facility]/Dr Vaught.  CB#[id number]/MW  Pt states she feels like she is getting a yeast infection.  Pt states she is itching and burning/MW

## 2014-10-27 NOTE — Telephone Encounter (Signed)
Please advise 

## 2014-10-27 NOTE — Telephone Encounter (Signed)
Please refer [facility] for chronic sinus congestion. Thanks.

## 2014-10-28 ENCOUNTER — Ambulatory Visit: Payer: Medicare Other | Admitting: Cardiovascular Disease

## 2014-10-28 ENCOUNTER — Telehealth: Payer: Self-pay | Admitting: Family Medicine

## 2014-10-28 MED ORDER — FLUCONAZOLE 150 MG PO TABS: 150.0000 mg | ORAL_TABLET | Freq: Once | ORAL | Status: DC

## 2014-10-28 NOTE — Telephone Encounter (Signed)
rx for diflucan has been sent to [facility]

## 2014-10-28 NOTE — Telephone Encounter (Signed)
Left message saying that prescription was at the pharmacy.

## 2014-10-28 NOTE — Telephone Encounter (Signed)
Pt is requesting medication in the form of pill for her yeast infection cause by her antibiotic. Pt requested this yesterday in the referral message that was sent back but wasn't sure if it might have been missed because she didn't hear back about anything being sent to the pharmacy. [facility]. Thanks TNP

## 2014-10-28 NOTE — Telephone Encounter (Signed)
Ok to send in Diflucan? 

## 2014-10-30 ENCOUNTER — Other Ambulatory Visit: Payer: Self-pay | Admitting: Family Medicine

## 2014-11-02 ENCOUNTER — Ambulatory Visit (INDEPENDENT_AMBULATORY_CARE_PROVIDER_SITE_OTHER): Payer: Medicare Other | Admitting: Internal Medicine

## 2014-11-02 ENCOUNTER — Encounter: Payer: Self-pay | Admitting: Internal Medicine

## 2014-11-02 VITALS — BP 118/80 | HR 91 | Ht 63.0 in | Wt 151.0 lb

## 2014-11-02 DIAGNOSIS — J441 Chronic obstructive pulmonary disease with (acute) exacerbation: Secondary | ICD-10-CM | POA: Diagnosis not present

## 2014-11-02 MED ORDER — IPRATROPIUM-ALBUTEROL 0.5-2.5 (3) MG/3ML IN SOLN
6.0000 mL | Freq: Once | RESPIRATORY_TRACT | Status: AC
  Administered 2014-11-02: 6 mL via RESPIRATORY_TRACT

## 2014-11-02 MED ORDER — AZITHROMYCIN 250 MG PO TABS: ORAL_TABLET | ORAL | Status: DC

## 2014-11-02 MED ORDER — PREDNISONE 20 MG PO TABS: 20.0000 mg | ORAL_TABLET | Freq: Every day | ORAL | Status: DC

## 2014-11-02 NOTE — Assessment & Plan Note (Signed)
>>  ASSESSMENT AND PLAN FOR COPD (CHRONIC OBSTRUCTIVE PULMONARY DISEASE) (HCC) WRITTEN ON 11/02/2014 10:12 AM BY MUNGAL, VISHAL, MD  Today the patient most likely has a COPD exacerbation triggered by a recent upper respiratory tract infection, or angitis, sinusitis. She's had recent completion of Unasyn , Augmentin , and steroids, seen in the ED twice in the last month for third infection and sinus infection. At this time she does have mild clinical improvement however has ongoing symptoms of thick productive sputum, shortness of breath, and allergic rhinitis. Throughout all this she's continues to smoke about half pack per day, To the patient that smoking during treatment for acute flexion is not effective, and will take infection and symptoms longer to clear.   Plan: -Z-Pak -Prednisone  20 mg daily with breakfast for 5 days -Tobacco reduction and eventual cessation of tobacco -Follow-up with ENT for recurrent pharyngitis/sinusitis -Patient receive 2 DuoNeb's today for decreased breath sounds and mild basilar wheezing during office visit, with improvement afterwards

## 2014-11-02 NOTE — Patient Instructions (Signed)
Follow up with Dr. Ashby Dawes in 2 months - cut back on tobacco use and eventually stop - Zpack - use as directed - Prednisone 20mg  - 1 tab by mouth with breakfast for 5 days, then stop - follow up with ENT for chronic sinusitis.

## 2014-11-02 NOTE — Assessment & Plan Note (Signed)
Today the patient most likely has a COPD exacerbation triggered by a recent upper respiratory tract infection, or angitis, sinusitis. She's had recent completion of Unasyn, Augmentin, and steroids, seen in the ED twice in the last month for third infection and sinus infection. At this time she does have mild clinical improvement however has ongoing symptoms of thick productive sputum, shortness of breath, and allergic rhinitis. Throughout all this she's continues to smoke about half pack per day, To the patient that smoking during treatment for acute flexion is not effective, and will take infection and symptoms longer to clear.   Plan: -Z-Pak -Prednisone 20 mg daily with breakfast for 5 days -Tobacco reduction and eventual cessation of tobacco -Follow-up with ENT for recurrent pharyngitis/sinusitis -Patient receive 2 DuoNeb's today for decreased breath sounds and mild basilar wheezing during office visit, with improvement afterwards

## 2014-11-02 NOTE — Progress Notes (Signed)
[facility] Consultation      MRN# [id number] Andrea [name] 07-20-1965   CC: Chief Complaint  Patient presents with  . Follow-up    cough, prod at times w/green mucus; SOB when coughing      Brief History: HPI 10/04/14 This Andrea [name] is a 49 year old Caucasian female with severe COPD, which appears to be complicated by anxiety. She has been maintained on's. On Advair. She has had frequent exacerbations requiring prednisone taper in the past. She was in the ER again this past weekend because she was having trouble breathing, she has been coughing for about a week, and then needed to go to the ER. She was up all night 3 nights ago and decided to go to the ER in the morning. She was given a breathing treatment and prednisone which she is on currently.   She notes that her breathing is a bit better, but she is still coughing and having cold symptoms.   She has quit smoking with [employer] in the past for about 2 months a year ago. She went to smoking due to anxiety. Plan: 06/2013 CT high res chest> mild centrilobular emphysema but no ILD, multiple scattered pulm nodules all 17mm in size 07/2014 CT chest > nodule unchanged --Repeat CT chest in 1 year, if stable can stop surveillance at that time. --Continues to smoke, discussed cessation strategies today. --Severe COPD, continue bronchodilators.   --She uses nebulized albuterol but not ipratropium; 2 to 3 times per day when she is not feeling well.   --She uses spiriva daily.   --Continue symbicort. --Currently klonopin tid, she takes xanax 2 or 3 days per week.   --Uses trazodone to sleep about 1 night per week.  Events Since last visit: Patient presents today for an ED Follow up visit for pharyngitis, and possible recurrent bronchitis.  She has had several medical appointments in the last 1-2 weeks, see below for full details. Hascompleted a course of Unasyn, Augmentin, and prednisone on Sunday. Complaints today of thick  green sputum by cough, admits to fatigue, and shortness of breath. Still smoking about half pack a day throughout her current treatments. Has follow-up appointment with [facility] for recurrent sinusitis/pharyngitis. This is been going on since mid June-July. Recently seen by pulmonary for severe COPD.   ED Visit 10/23/14: Andrea [name] is a 49 y.o. female who is currently on Augmentin for an illness consisting of cough, vomiting, sore throat and an elevated white blood cell count 2 days ago when she was seen in the emergency department. She came to the ED today because since yesterday she's had a lot of watery diarrhea, up to 18 times. Non-bloody. No fever, however she is reporting hot flashes. No skin rash. Sore throat is improved. Patient still has a cough, and did not have an x-ray on the evaluation 2 days ago.  No abdominal pain. No dysuria. ED COURSE / ASSESSMENT AND PLAN  CONSULTATIONS: None  Pertinent labs & imaging results that were available during my care of the patient were reviewed by me and considered in my medical decision making (see chart for details).   Patient's exam and evaluation are reassuring. There are no signs of kidney failure likely on values. Her white blood cell count left shift have resolved. Nausea for diarrhea is due to what might of been a virus causing initially vomiting and now diarrhea, or is due to the Augmentin side effect, or is a combination of both these factors.  I did ask her to increase probiotics, and continued to make sure she remains hydrated.  Have follow-up up with her PCP closely within 1-2 days.  PCP Visit 10/25/14 - Dr. Caryn Section Patient was seen on 10/20/2014 at [facility] ED for Pharyngitis and then again in the evening of 10/20/2014 for allergic reaction to [facility]. At her initial visit she had negative strep test. When she returned later that night for reaction to mouthwash she was found to have elevated WBC of 18.7 with  elevated neutrophils. , she was given IV Unasyn and prescription for Augmentin.   She returned to ER 8-21 for severe diarrhea possible due to antibiotics. She was advised to continue abx since her white cell count had normalized, and told to start taking probiotics. She states BMs are much better, but still not back to normal consistency.   She was previously seen in this office 09/22/14 for ear pain and sinusitis she was prescribed amoxicillin and fluticasone nasal spray. She called back 10/10/14 requesting another round of abx because her sinus drainage had not completely resolve. She states it never cleared up completely is very concerned she may have something wrong with her sinuses. Plan: 1. Pharyngitis Greatly improved since ER visit when given Unasyn and put on augmentin last week.    2. Diarrhea Probably secondary to Augmentin. Has greatly improved on probiotics. Advised her to complete 7 days of Augmentin and continue probiotics for 3-4 days more.   3. Acute frontal sinusitis, recurrence not specified Expect resolution with Augmentin. She is very concerned about underlying sinus disease. Advised her to call back for ENT referral if sx not resolved when finished with antibiotic. Referral to [facility] - for recurrent sinusitis Diflucan for yeast infection.         Medication:   Current Outpatient Rx  Name  Route  Sig  Dispense  Refill  . albuterol (PROVENTIL HFA;VENTOLIN HFA) 108 (90 BASE) MCG/ACT inhaler   Inhalation   Inhale 2 puffs into the lungs every 6 (six) hours as needed for wheezing or shortness of breath.         Marland Kitchen albuterol (PROVENTIL) (2.5 MG/3ML) 0.083% nebulizer solution   Nebulization   Take 2.5 mg by nebulization every 4 (four) hours as needed for wheezing or shortness of breath.         . ALPRAZolam (XANAX) 1 MG tablet      TAKE 1 TABLET BY MOUTH EVERY 8 HOURS AS NEEDED   60 tablet   1     Not to exceed 2 additional fills before 12/10/2014 ...   .  Alum & Mag Hydroxide-Simeth (MAGIC MOUTHWASH) SOLN   Oral   Take 5 mLs by mouth 4 (four) times daily.   100 mL   0   . ARIPiprazole (ABILIFY) 5 MG tablet   Oral   Take 5 mg by mouth daily.         . budesonide-formoterol (SYMBICORT) 160-4.5 MCG/ACT inhaler   Inhalation   Inhale 1 puff into the lungs. Up to 3 times a day         . clonazePAM (KLONOPIN) 0.5 MG tablet   Oral   Take 0.5 mg by mouth 3 (three) times daily as needed for anxiety.         . DULoxetine (CYMBALTA) 60 MG capsule   Oral   Take 60 mg by mouth daily.         . fluconazole (DIFLUCAN) 150 MG tablet  Oral   Take 1 tablet (150 mg total) by mouth once.   1 tablet   0   . fluticasone (FLONASE) 50 MCG/ACT nasal spray   Each Nare   Place 2 sprays into both nostrils daily.   16 g   6   . hydroxychloroquine (PLAQUENIL) 200 MG tablet      TAKE 2 TABLETS BY MOUTH DAILY   60 tablet   5   . lansoprazole (PREVACID) 30 MG capsule   Oral   Take 30 mg by mouth 2 (two) times daily before a meal.         . nitroGLYCERIN (NITROSTAT) 0.4 MG SL tablet   Sublingual   Place 0.4 mg under the tongue every 5 (five) minutes as needed for chest pain.         . predniSONE (DELTASONE) 20 MG tablet   Oral   Take 1 tablet (20 mg total) by mouth once. Patient not taking: Reported on 10/25/2014   5 tablet   0   . promethazine (PHENERGAN) 25 MG tablet   Oral   Take 1 tablet (25 mg total) by mouth every 6 (six) hours as needed for nausea or vomiting.   20 tablet   0   . propranolol (INDERAL) 20 MG tablet   Oral   Take 20 mg by mouth 2 (two) times daily.         Marland Kitchen Respiratory Therapy Supplies (FLUTTER) DEVI   Does not apply   1 Device by Does not apply route daily.   1 each   0   . tiotropium (SPIRIVA) 18 MCG inhalation capsule   Inhalation   Place 18 mcg into inhaler and inhale daily.         . TraZODone HCl 150 MG TB24   Oral   Take 150 mg by mouth at bedtime as needed.             Review of Systems  Constitutional: Positive for malaise/fatigue. Negative for fever and chills.  HENT: Positive for congestion and sore throat.   Eyes: Positive for redness. Negative for pain and discharge.  Respiratory: Positive for cough, sputum production and shortness of breath.   Cardiovascular: Negative for chest pain and palpitations.  Gastrointestinal: Negative for heartburn, nausea, vomiting and abdominal pain.  Genitourinary: Negative.   Musculoskeletal: Negative for myalgias.  Neurological: Negative.   Endo/Heme/Allergies: Negative.   Psychiatric/Behavioral: Negative.       Allergies:  Cefdinir; Doxycycline; and Ivp dye  Physical Examination:  VS: BP 118/80 mmHg  Pulse 91  Ht 5\' 3"  (1.6 m)  Wt 151 lb (68.493 kg)  BMI 26.76 kg/m2  SpO2 100%  General Appearance: No distress  HEENT: PERRLA, no ptosis, no other lesions noticed Pulmonary: Cough with thick productive sputum, greenish Good upper respiratory air movement, decreased breath sounds with basilar wheezing bilaterally. Postbronchodilator treatment-improved breath sounds at the bases with no wheezing Cardiovascular:  Normal S1,S2.  No m/r/g.     Abdomen:Exam: Benign, Soft, non-tender, No masses  Skin:   warm, no rashes, no ecchymosis  Extremities: normal, no cyanosis, clubbing, warm with normal capillary refill.      Rad results: (The following images and results were reviewed by Dr. Stevenson Clinch). CXR 10/23/14 FINDINGS: Cardiopericardial silhouette within normal limits. Mediastinal contours normal. Trachea midline. No airspace disease or effusion. Aortic arch atherosclerosis. Scattered areas of subsegmental atelectasis and/or pulmonary parenchymal scarring are present. Visible bowel gas pattern appears normal.  IMPRESSION: No active cardiopulmonary disease.  Assessment and Plan: COPD exacerbation Today the patient most likely has a COPD exacerbation triggered by a recent upper respiratory tract  infection, or angitis, sinusitis. She's had recent completion of Unasyn, Augmentin, and steroids, seen in the ED twice in the last month for third infection and sinus infection. At this time she does have mild clinical improvement however has ongoing symptoms of thick productive sputum, shortness of breath, and allergic rhinitis. Throughout all this she's continues to smoke about half pack per day, To the patient that smoking during treatment for acute flexion is not effective, and will take infection and symptoms longer to clear.   Plan: -Z-Pak -Prednisone 20 mg daily with breakfast for 5 days -Tobacco reduction and eventual cessation of tobacco -Follow-up with ENT for recurrent pharyngitis/sinusitis -Patient receive 2 DuoNeb's today for decreased breath sounds and mild basilar wheezing during office visit, with improvement afterwards    Updated Medication List Outpatient Encounter Prescriptions as of 11/02/2014  Medication Sig  . albuterol (PROVENTIL HFA;VENTOLIN HFA) 108 (90 BASE) MCG/ACT inhaler Inhale 2 puffs into the lungs every 6 (six) hours as needed for wheezing or shortness of breath.  Marland Kitchen albuterol (PROVENTIL) (2.5 MG/3ML) 0.083% nebulizer solution Take 2.5 mg by nebulization every 4 (four) hours as needed for wheezing or shortness of breath.  . ALPRAZolam (XANAX) 1 MG tablet TAKE 1 TABLET BY MOUTH EVERY 8 HOURS AS NEEDED  . ARIPiprazole (ABILIFY) 5 MG tablet Take 5 mg by mouth daily.  Marland Kitchen azithromycin (ZITHROMAX) 250 MG tablet Take 2 tabs on Day One and 1 tab daily till gone.  . budesonide-formoterol (SYMBICORT) 160-4.5 MCG/ACT inhaler Inhale 1 puff into the lungs. Up to 3 times a day  . clonazePAM (KLONOPIN) 0.5 MG tablet Take 0.5 mg by mouth 3 (three) times daily as needed for anxiety.  . DULoxetine (CYMBALTA) 60 MG capsule Take 60 mg by mouth daily.  . fluticasone (FLONASE) 50 MCG/ACT nasal spray Place 2 sprays into both nostrils daily.  . hydroxychloroquine (PLAQUENIL) 200 MG  tablet TAKE 2 TABLETS BY MOUTH DAILY  . lansoprazole (PREVACID) 30 MG capsule Take 30 mg by mouth 2 (two) times daily before a meal.  . nitroGLYCERIN (NITROSTAT) 0.4 MG SL tablet Place 0.4 mg under the tongue every 5 (five) minutes as needed for chest pain.  . predniSONE (DELTASONE) 20 MG tablet Take 1 tablet (20 mg total) by mouth daily with breakfast.  . propranolol (INDERAL) 20 MG tablet Take 20 mg by mouth 2 (two) times daily.  Marland Kitchen Respiratory Therapy Supplies (FLUTTER) DEVI 1 Device by Does not apply route daily.  Marland Kitchen tiotropium (SPIRIVA) 18 MCG inhalation capsule Place 18 mcg into inhaler and inhale daily.  . TraZODone HCl 150 MG TB24 Take 150 mg by mouth at bedtime as needed.  . [DISCONTINUED] Alum & Mag Hydroxide-Simeth (MAGIC MOUTHWASH) SOLN Take 5 mLs by mouth 4 (four) times daily.  . [DISCONTINUED] fluconazole (DIFLUCAN) 150 MG tablet Take 1 tablet (150 mg total) by mouth once.  . [DISCONTINUED] predniSONE (DELTASONE) 20 MG tablet Take 1 tablet (20 mg total) by mouth once. (Patient not taking: Reported on 10/25/2014)  . [DISCONTINUED] promethazine (PHENERGAN) 25 MG tablet Take 1 tablet (25 mg total) by mouth every 6 (six) hours as needed for nausea or vomiting.  . [EXPIRED] ipratropium-albuterol (DUONEB) 0.5-2.5 (3) MG/3ML nebulizer solution 6 mL    No facility-administered encounter medications on file as of 11/02/2014.    Orders for this visit: No orders of the defined types were placed in  this encounter.    Thank  you for the visitation and for allowing  [facility] to assist in the care of your patient. Our recommendations are noted above.  Please contact us if we can be of further service.  Vilinda Boehringer, MD [facility] Office Number: [phone]

## 2014-11-08 DIAGNOSIS — J328 Other chronic sinusitis: Secondary | ICD-10-CM | POA: Diagnosis not present

## 2014-11-08 DIAGNOSIS — J342 Deviated nasal septum: Secondary | ICD-10-CM | POA: Diagnosis not present

## 2014-11-09 ENCOUNTER — Other Ambulatory Visit: Payer: Self-pay | Admitting: Family Medicine

## 2014-11-09 DIAGNOSIS — F419 Anxiety disorder, unspecified: Secondary | ICD-10-CM

## 2014-11-10 NOTE — Telephone Encounter (Signed)
Please call in alprazolam.  

## 2014-11-15 ENCOUNTER — Other Ambulatory Visit: Payer: Self-pay | Admitting: Family Medicine

## 2014-11-15 DIAGNOSIS — F419 Anxiety disorder, unspecified: Secondary | ICD-10-CM

## 2014-11-15 NOTE — Telephone Encounter (Signed)
Please call in alprazolam.  

## 2014-11-15 NOTE — Telephone Encounter (Signed)
Rx called in to pharmacy. 

## 2014-11-23 ENCOUNTER — Encounter: Payer: Self-pay | Admitting: Internal Medicine

## 2014-11-23 ENCOUNTER — Encounter: Payer: Self-pay | Admitting: *Deleted

## 2014-11-24 MED ORDER — ALBUTEROL SULFATE HFA 108 (90 BASE) MCG/ACT IN AERS: 2.0000 | INHALATION_SPRAY | Freq: Four times a day (QID) | RESPIRATORY_TRACT | Status: DC | PRN

## 2014-11-25 ENCOUNTER — Ambulatory Visit: Payer: Medicare Other | Admitting: Internal Medicine

## 2014-11-25 NOTE — Anesthesia Preprocedure Evaluation (Addendum)
Anesthesia Evaluation  Patient identified by MRN, date of birth, ID band  History of Anesthesia Complications (+) PONV and history of anesthetic complications  Airway Mallampati: II  TM Distance: >3 FB Neck ROM: Full    Dental   Pulmonary COPD,  COPD inhaler, former smoker,    breath sounds clear to auscultation       Cardiovascular hypertension,      Neuro/Psych    GI/Hepatic   Endo/Other    Renal/GU      Musculoskeletal  (+) Arthritis , Osteoarthritis,  Fibromyalgia -  Abdominal   Peds  Hematology   Anesthesia Other Findings   Reproductive/Obstetrics                           Anesthesia Physical Anesthesia Plan  ASA: II  Anesthesia Plan: General   Post-op Pain Management:    Induction: Intravenous  Airway Management Planned:   Additional Equipment:   Intra-op Plan:   Post-operative Plan:   Informed Consent: I have reviewed the patients History and Physical, chart, labs and discussed the procedure including the risks, benefits and alternatives for the proposed anesthesia with the patient or authorized representative who has indicated his/her understanding and acceptance.     Plan Discussed with: CRNA  Anesthesia Plan Comments:         Anesthesia Quick Evaluation

## 2014-11-28 ENCOUNTER — Encounter: Payer: Self-pay | Admitting: Internal Medicine

## 2014-11-28 ENCOUNTER — Ambulatory Visit (INDEPENDENT_AMBULATORY_CARE_PROVIDER_SITE_OTHER): Payer: Medicare Other | Admitting: Internal Medicine

## 2014-11-28 VITALS — BP 120/60 | HR 82 | Ht 63.0 in | Wt 151.0 lb

## 2014-11-28 DIAGNOSIS — J441 Chronic obstructive pulmonary disease with (acute) exacerbation: Secondary | ICD-10-CM

## 2014-11-28 MED ORDER — PREDNISONE 20 MG PO TABS: ORAL_TABLET | ORAL | Status: DC

## 2014-11-28 NOTE — Patient Instructions (Signed)

## 2014-11-28 NOTE — Progress Notes (Signed)
[facility] Consultation      MRN# [id number] Andrea [name] [birth year]   CC: Chief Complaint  Patient presents with  . Follow-up    needs surgical clearance; cough at times     HPI:  Patient here for Pre Op evaluation for her underlying COPD  Patient to undergo Sinus surgery By Dr Andrea [name] on Wednesday  Patient with recent COPD exacerbation, patient still smokes, trying hard to quit, uses nicotine patches Patient does not have signs of COPD excareation or infection at this time  I have explained that patient has moderate risk for pulmonary complications(includes wheezing, infection, resp distress) when compared to general population        Medication:   Current Outpatient Rx  Name  Route  Sig  Dispense  Refill  . albuterol (PROVENTIL HFA;VENTOLIN HFA) 108 (90 BASE) MCG/ACT inhaler   Inhalation   Inhale 2 puffs into the lungs every 6 (six) hours as needed for wheezing or shortness of breath.   1 Inhaler   3   . albuterol (PROVENTIL) (2.5 MG/3ML) 0.083% nebulizer solution   Nebulization   Take 2.5 mg by nebulization every 4 (four) hours as needed for wheezing or shortness of breath.         . ALPRAZolam (XANAX) 1 MG tablet      TAKE 1 TABLET BY MOUTH EVERY 8 HOURS AS NEEDED   60 tablet   1   . ARIPiprazole (ABILIFY) 5 MG tablet   Oral   Take 5 mg by mouth daily.         . budesonide-formoterol (SYMBICORT) 160-4.5 MCG/ACT inhaler   Inhalation   Inhale 1 puff into the lungs. Up to 3 times a day         . clonazePAM (KLONOPIN) 0.5 MG tablet   Oral   Take 0.5 mg by mouth 3 (three) times daily as needed for anxiety.         . DULoxetine (CYMBALTA) 60 MG capsule   Oral   Take 60 mg by mouth daily.         . fluticasone (FLONASE) 50 MCG/ACT nasal spray   Each Nare   Place 2 sprays into both nostrils daily.   16 g   6   . hydroxychloroquine (PLAQUENIL) 200 MG tablet      TAKE 2 TABLETS BY MOUTH DAILY   60 tablet   5   .  lansoprazole (PREVACID) 30 MG capsule   Oral   Take 30 mg by mouth 2 (two) times daily before a meal.         . nitroGLYCERIN (NITROSTAT) 0.4 MG SL tablet   Sublingual   Place 0.4 mg under the tongue every 5 (five) minutes as needed for chest pain.         Marland Kitchen propranolol (INDERAL) 20 MG tablet   Oral   Take 20 mg by mouth 2 (two) times daily.         Marland Kitchen Respiratory Therapy Supplies (FLUTTER) DEVI   Does not apply   1 Device by Does not apply route daily.   1 each   0   . tiotropium (SPIRIVA) 18 MCG inhalation capsule   Inhalation   Place 18 mcg into inhaler and inhale daily.         . TraZODone HCl 150 MG TB24   Oral   Take 150 mg by mouth at bedtime as needed.         . predniSONE (DELTASONE)  20 MG tablet      One tablet daily for 5 days   5 tablet   0      Review of Systems  Constitutional: Negative for fever, chills and malaise/fatigue.  HENT: Negative for congestion and sore throat.   Eyes: Negative for pain, discharge and redness.  Respiratory: Positive for cough. Negative for sputum production and shortness of breath.   Cardiovascular: Negative for chest pain and palpitations.  Gastrointestinal: Negative for heartburn, nausea, vomiting and abdominal pain.  Genitourinary: Negative.   Musculoskeletal: Negative for myalgias.  Neurological: Negative.   Endo/Heme/Allergies: Negative.   Psychiatric/Behavioral: Negative.       Allergies:  Alum & mag hydroxide-simeth; Cefdinir; Doxycycline; and Ivp dye  Physical Examination:  VS: BP 120/60 mmHg  Pulse 82  Ht 5\' 3"  (1.6 m)  Wt 151 lb (68.493 kg)  BMI 26.76 kg/m2  SpO2 97%  General Appearance: No distress  HEENT: PERRLA, no ptosis, no other lesions noticed Pulmonary: Good upper respiratory air movement, Cardiovascular:  Normal S1,S2.  No m/r/g.     Abdomen:Exam: Benign, Soft, non-tender, No masses  Skin:   warm, no rashes, no ecchymosis  Extremities: normal, no cyanosis, clubbing, warm with normal  capillary refill.       Assessment and Plan:  49 yo white female with moderate COPD with ongoing tobacco abuse to undergo Sinus surgery Patient has Moderate Risk fr intra/post surgery pulmonary complications  1.will prescribe prednisone 20 mg daily for next 5 days 2.continue inhaled meds as prescribed 3.recommend smoking cessation 4.post op incentive spirometry recommended  I have personally obtained a history, examined the patient, evaluated Pertinent laboratory and RadioGraphic/imaging results, and  formulated the assessment and plan   The Patient requires high complexity decision making for assessment and support, frequent evaluation and titration of therapies.    [name], M.D.  [name]  Medical Director [place] Director [facility] Cardio-Pulmonary Department

## 2014-11-29 NOTE — Discharge Instructions (Signed)
[facility] °[facility] °ENDOSCOPIC SINUS SURGERY °[facility] ° °What is Functional Endoscopic Sinus Surgery? ° The Surgery involves making the natural openings of the sinuses larger by removing the bony partitions that separate the sinuses from the nasal cavity.  The natural sinus lining is preserved as much as possible to allow the sinuses to resume normal function after the surgery.  In some patients nasal polyps (excessively swollen lining of the sinuses) may be removed to relieve obstruction of the sinus openings.  The surgery is performed through the nose using lighted scopes, which eliminates the need for incisions on the face.  A septoplasty is a different procedure which is sometimes performed with sinus surgery.  It involves straightening the boy partition that separates the two sides of your nose.  A crooked or deviated septum may need repair if is obstructing the sinuses or nasal airflow.  Turbinate reduction is also often performed during sinus surgery.  The turbinates are bony proturberances from the side walls of the nose which swell and can obstruct the nose in patients with sinus and allergy problems.  Their size can be surgically reduced to help relieve nasal obstruction. ° °What Can Sinus Surgery Do For Me? ° Sinus surgery can reduce the frequency of sinus infections requiring antibiotic treatment.  This can provide improvement in nasal congestion, post-nasal drainage, facial pressure and nasal obstruction.  Surgery will NOT prevent you from ever having an infection again, so it usually only for patients who get infections 4 or more times yearly requiring antibiotics, or for infections that do not clear with antibiotics.  It will not cure nasal allergies, so patients with allergies may still require medication to treat their allergies after surgery. Surgery may improve headaches related to sinusitis, however, some people will continue to  require medication to control sinus headaches related to allergies.  Surgery will do nothing for other forms of headache (migraine, tension or cluster). ° °What Are the Risks of Endoscopic Sinus Surgery? ° Current techniques allow surgery to be performed safely with little risk, however, there are rare complications that patients should be aware of.  Because the sinuses are located around the eyes, there is risk of eye injury, including blindness, though again, this would be quite rare. This is usually a result of bleeding behind the eye during surgery, which puts the vision oat risk, though there are treatments to protect the vision and prevent permanent disrupted by surgery causing a leak of the spinal fluid that surrounds the brain.  More serious complications would include bleeding inside the brain cavity or damage to the brain.  Again, all of these complications are uncommon, and spinal fluid leaks can be safely managed surgically if they occur.  The most common complication of sinus surgery is bleeding from the nose, which may require packing or cauterization of the nose.  Continued sinus have polyps may experience recurrence of the polyps requiring revision surgery.  Alterations of sense of smell or injury to the tear ducts are also rare complications.  ° °What is the Surgery Like, and what is the Recovery? ° The Surgery usually takes a couple of hours to perform, and is usually performed under a general anesthetic (completely asleep).  Patients are usually discharged home after a couple of hours.  Sometimes during surgery it is necessary to pack the nose to control bleeding, and the packing is left in place for 24 - 48 hours, and removed by your surgeon.    If a septoplasty was performed during the procedure, there is often a splint placed which must be removed after 5-7 days.   °Discomfort: Pain is usually mild to moderate, and can be controlled by prescription pain medication or acetaminophen (Tylenol).   Aspirin, Ibuprofen (Advil, Motrin), or Naprosyn (Aleve) should be avoided, as they can cause increased bleeding.  Most patients feel sinus pressure like they have a bad head cold for several days.  Sleeping with your head elevated can help reduce swelling and facial pressure, as can ice packs over the face.  A humidifier may be helpful to keep the mucous and blood from drying in the nose.  ° °Diet: There are no specific diet restrictions, however, you should generally start with clear liquids and a light diet of bland foods because the anesthetic can cause some nausea.  Advance your diet depending on how your stomach feels.  Taking your pain medication with food will often help reduce stomach upset which pain medications can cause. ° °Nasal Saline Irrigation: It is important to remove blood clots and dried mucous from the nose as it is healing.  This is done by having you irrigate the nose at least 3 - 4 times daily with a salt water solution.  We recommend using NeilMed Sinus Rinse (available at the drug store).  Fill the squeeze bottle with the solution, bend over a sink, and insert the tip of the squeeze bottle into the nose ½ of an inch.  Point the tip of the squeeze bottle towards the inside corner of the eye on the same side your irrigating.  Squeeze the bottle and gently irrigate the nose.  If you bend forward as you do this, most of the fluid will flow back out of the nose, instead of down your throat.   The solution should be warm, near body temperature, when you irrigate.   Each time you irrigate, you should use a full squeeze bottle.  ° °Note that if you are instructed to use Nasal Steroid Sprays at any time after your surgery, irrigate with saline BEFORE using the steroid spray, so you do not wash it all out of the nose. °Another product, Nasal Saline Gel (such as AYR Nasal Saline Gel) can be applied in each nostril 3 - 4 times daily to moisture the nose and reduce scabbing or crusting. ° °Bleeding:   Bloody drainage from the nose can be expected for several days, and patients are instructed to irrigate their nose frequently with salt water to help remove mucous and blood clots.  The drainage may be dark red or brown, though some fresh blood may be seen intermittently, especially after irrigation.  Do not blow you nose, as bleeding may occur. If you must sneeze, keep your mouth open to allow air to escape through your mouth. ° °If heavy bleeding occurs: Irrigate the nose with saline to rinse out clots, then spray the nose 3 - 4 times with Afrin Nasal Decongestant Spray.  The spray will constrict the blood vessels to slow bleeding.  Pinch the lower half of your nose shut to apply pressure, and lay down with your head elevated.  Ice packs over the nose may help as well. If bleeding persists despite these measures, you should notify your doctor.  Do not use the Afrin routinely to control nasal congestion after surgery, as it can result in worsening congestion and may affect healing.  ° ° ° °Activity: Return to work varies among patients. Most patients will be   out of work at least 5 - 7 days to recover.  Patient may return to work after they are off of narcotic pain medication, and feeling well enough to perform the functions of their job.  Patients must avoid heavy lifting (over 10 pounds) or strenuous physical for 2 weeks after surgery, so your employer may need to assign you to light duty, or keep you out of work longer if light duty is not possible.  NOTE: you should not drive, operate dangerous machinery, do any mentally demanding tasks or make any important legal or financial decisions while on narcotic pain medication and recovering from the general anesthetic.  °  °Call Your Doctor Immediately if You Have Any of the Following: °1. Bleeding that you cannot control with the above measures °2. Loss of vision, double vision, bulging of the eye or black eyes. °3. Fever over 101 degrees °4. Neck stiffness with  severe headache, fever, nausea and change in mental state. °You are always encourage to call anytime with concerns, however, please call with requests for pain medication refills during office hours. ° °Office Endoscopy: During follow-up visits your doctor will remove any packing or splints that may have been placed and evaluate and clean your sinuses endoscopically.  Topical anesthetic will be used to make this as comfortable as possible, though you may want to take your pain medication prior to the visit.  How often this will need to be done varies from patient to patient.  After complete recovery from the surgery, you may need follow-up endoscopy from time to time, particularly if there is concern of recurrent infection or nasal polyps. ° °General Anesthesia, Care After °Refer to this sheet in the next few weeks. These instructions provide you with information on caring for yourself after your procedure. Your health care provider may also give you more specific instructions. Your treatment has been planned according to current medical practices, but problems sometimes occur. Call your health care provider if you have any problems or questions after your procedure. °WHAT TO EXPECT AFTER THE PROCEDURE °After the procedure, it is typical to experience: °· Sleepiness. °· Nausea and vomiting. °HOME CARE INSTRUCTIONS °· For the first 24 hours after general anesthesia: °¨ Have a responsible person with you. °¨ Do not drive a car. If you are alone, do not take public transportation. °¨ Do not drink alcohol. °¨ Do not take medicine that has not been prescribed by your health care provider. °¨ Do not sign important papers or make important decisions. °¨ You may resume a normal diet and activities as directed by your health care provider. °· Change bandages (dressings) as directed. °· If you have questions or problems that seem related to general anesthesia, call the hospital and ask for the anesthetist or anesthesiologist  on call. °SEEK MEDICAL CARE IF: °· You have nausea and vomiting that continue the day after anesthesia. °· You develop a rash. °SEEK IMMEDIATE MEDICAL CARE IF:  °· You have difficulty breathing. °· You have chest pain. °· You have any allergic problems. °Document Released: 05/27/2000 Document Revised: 02/23/2013 Document Reviewed: 09/03/2012 °Exit[employer]® Patient Information ©2015 [employer]. This information is not intended to replace advice given to you by your health care provider. Make sure you discuss any questions you have with your health care provider. ° °

## 2014-11-30 ENCOUNTER — Ambulatory Visit: Payer: Medicare Other | Admitting: Student in an Organized Health Care Education/Training Program

## 2014-11-30 ENCOUNTER — Encounter
Admission: RE | Disposition: A | Discharge status: Home / Self Care | Payer: Self-pay | Source: Ambulatory Visit | Attending: Otolaryngology

## 2014-11-30 ENCOUNTER — Ambulatory Visit
Admission: RE | Admit: 2014-11-30 | Discharge: 2014-11-30 | Disposition: A | Discharge status: Home / Self Care | Payer: Medicare Other | Source: Ambulatory Visit | Attending: Otolaryngology | Admitting: Otolaryngology

## 2014-11-30 DIAGNOSIS — M797 Fibromyalgia: Secondary | ICD-10-CM | POA: Diagnosis not present

## 2014-11-30 DIAGNOSIS — J342 Deviated nasal septum: Secondary | ICD-10-CM | POA: Insufficient documentation

## 2014-11-30 DIAGNOSIS — M1990 Unspecified osteoarthritis, unspecified site: Secondary | ICD-10-CM | POA: Diagnosis not present

## 2014-11-30 DIAGNOSIS — J343 Hypertrophy of nasal turbinates: Secondary | ICD-10-CM | POA: Insufficient documentation

## 2014-11-30 DIAGNOSIS — J323 Chronic sphenoidal sinusitis: Secondary | ICD-10-CM | POA: Diagnosis not present

## 2014-11-30 DIAGNOSIS — J320 Chronic maxillary sinusitis: Secondary | ICD-10-CM | POA: Diagnosis not present

## 2014-11-30 DIAGNOSIS — F419 Anxiety disorder, unspecified: Secondary | ICD-10-CM | POA: Diagnosis not present

## 2014-11-30 DIAGNOSIS — I10 Essential (primary) hypertension: Secondary | ICD-10-CM | POA: Insufficient documentation

## 2014-11-30 DIAGNOSIS — F17200 Nicotine dependence, unspecified, uncomplicated: Secondary | ICD-10-CM | POA: Insufficient documentation

## 2014-11-30 DIAGNOSIS — J329 Chronic sinusitis, unspecified: Secondary | ICD-10-CM | POA: Diagnosis not present

## 2014-11-30 DIAGNOSIS — J449 Chronic obstructive pulmonary disease, unspecified: Secondary | ICD-10-CM | POA: Insufficient documentation

## 2014-11-30 DIAGNOSIS — J3489 Other specified disorders of nose and nasal sinuses: Secondary | ICD-10-CM | POA: Diagnosis not present

## 2014-11-30 HISTORY — PX: IMAGE GUIDED SINUS SURGERY: SHX6570

## 2014-11-30 HISTORY — DX: Gastro-esophageal reflux disease without esophagitis: K21.9

## 2014-11-30 HISTORY — PX: SPHENOIDECTOMY: SHX2421

## 2014-11-30 HISTORY — PX: SEPTOPLASTY: SHX2393

## 2014-11-30 HISTORY — DX: Other specified postprocedural states: Z98.890

## 2014-11-30 HISTORY — PX: MAXILLARY ANTROSTOMY: SHX2003

## 2014-11-30 HISTORY — DX: Presence of dental prosthetic device (complete) (partial): Z97.2

## 2014-11-30 HISTORY — DX: Other specified postprocedural states: R11.2

## 2014-11-30 HISTORY — DX: Unspecified osteoarthritis, unspecified site: M19.90

## 2014-11-30 SURGERY — SINUS SURGERY, WITH IMAGING GUIDANCE
Anesthesia: General | Wound class: Clean Contaminated

## 2014-11-30 MED ORDER — ALBUTEROL SULFATE HFA 108 (90 BASE) MCG/ACT IN AERS
INHALATION_SPRAY | RESPIRATORY_TRACT | Status: DC | PRN
  Administered 2014-11-30: 2 via RESPIRATORY_TRACT

## 2014-11-30 MED ORDER — PROMETHAZINE HCL 25 MG/ML IJ SOLN
6.2500 mg | INTRAMUSCULAR | Status: DC | PRN
  Administered 2014-11-30: 6.25 mg via INTRAVENOUS

## 2014-11-30 MED ORDER — GLYCOPYRROLATE 0.2 MG/ML IJ SOLN
INTRAMUSCULAR | Status: DC | PRN
  Administered 2014-11-30: 0.2 mg via INTRAVENOUS

## 2014-11-30 MED ORDER — LIDOCAINE-EPINEPHRINE 1 %-1:100000 IJ SOLN
INTRAMUSCULAR | Status: DC | PRN
  Administered 2014-11-30: 9 mL

## 2014-11-30 MED ORDER — FENTANYL CITRATE (PF) 100 MCG/2ML IJ SOLN
INTRAMUSCULAR | Status: DC | PRN
  Administered 2014-11-30: 100 ug via INTRAVENOUS

## 2014-11-30 MED ORDER — SCOPOLAMINE 1 MG/3DAYS TD PT72
1.0000 | MEDICATED_PATCH | TRANSDERMAL | Status: DC
Start: 2014-11-30 — End: 2014-11-30
  Administered 2014-11-30: 1.5 mg via TRANSDERMAL

## 2014-11-30 MED ORDER — ROCURONIUM BROMIDE 100 MG/10ML IV SOLN
INTRAVENOUS | Status: DC | PRN
  Administered 2014-11-30: 30 mg via INTRAVENOUS

## 2014-11-30 MED ORDER — OXYCODONE-ACETAMINOPHEN 5-325 MG PO TABS: 1.0000 | ORAL_TABLET | ORAL | Status: DC | PRN

## 2014-11-30 MED ORDER — FENTANYL CITRATE (PF) 100 MCG/2ML IJ SOLN: 25.0000 ug | INTRAMUSCULAR | Status: DC | PRN

## 2014-11-30 MED ORDER — PROPOFOL 10 MG/ML IV BOLUS
INTRAVENOUS | Status: DC | PRN
  Administered 2014-11-30: 150 mg via INTRAVENOUS

## 2014-11-30 MED ORDER — MIDAZOLAM HCL 5 MG/5ML IJ SOLN
INTRAMUSCULAR | Status: DC | PRN
  Administered 2014-11-30: 2 mg via INTRAVENOUS

## 2014-11-30 MED ORDER — OXYMETAZOLINE HCL 0.05 % NA SOLN
NASAL | Status: DC | PRN
  Administered 2014-11-30: 4 via TOPICAL

## 2014-11-30 MED ORDER — OXYCODONE HCL 5 MG/5ML PO SOLN: 5.0000 mg | Freq: Once | ORAL | Status: DC | PRN

## 2014-11-30 MED ORDER — LACTATED RINGERS IV SOLN
INTRAVENOUS | Status: DC
  Administered 2014-11-30: 09:00:00 via INTRAVENOUS

## 2014-11-30 MED ORDER — ONDANSETRON HCL 4 MG/2ML IJ SOLN
INTRAMUSCULAR | Status: DC | PRN
  Administered 2014-11-30: 4 mg via INTRAVENOUS

## 2014-11-30 MED ORDER — PROMETHAZINE HCL 12.5 MG PO TABS: 12.5000 mg | ORAL_TABLET | Freq: Four times a day (QID) | ORAL | Status: DC | PRN

## 2014-11-30 MED ORDER — DEXAMETHASONE SODIUM PHOSPHATE 4 MG/ML IJ SOLN
INTRAMUSCULAR | Status: DC | PRN
  Administered 2014-11-30: 8 mg via INTRAVENOUS

## 2014-11-30 MED ORDER — OXYCODONE HCL 5 MG PO TABS: 5.0000 mg | ORAL_TABLET | Freq: Once | ORAL | Status: DC | PRN

## 2014-11-30 MED ORDER — ACETAMINOPHEN 10 MG/ML IV SOLN
1000.0000 mg | Freq: Once | INTRAVENOUS | Status: AC
  Administered 2014-11-30: 1000 mg via INTRAVENOUS

## 2014-11-30 MED ORDER — PREDNISONE 10 MG (21) PO TBPK: ORAL_TABLET | ORAL | Status: DC

## 2014-11-30 MED ORDER — MEPERIDINE HCL 25 MG/ML IJ SOLN: 6.2500 mg | INTRAMUSCULAR | Status: DC | PRN

## 2014-11-30 MED ORDER — LIDOCAINE HCL (CARDIAC) 20 MG/ML IV SOLN
INTRAVENOUS | Status: DC | PRN
  Administered 2014-11-30: 40 mg via INTRAVENOUS

## 2014-11-30 MED ORDER — SULFAMETHOXAZOLE-TRIMETHOPRIM 800-160 MG PO TABS: 1.0000 | ORAL_TABLET | Freq: Two times a day (BID) | ORAL | Status: DC

## 2014-11-30 SURGICAL SUPPLY — 39 items
BALLOON SINUPLASTY SYSTEM (BALLOONS) IMPLANT
BATTERY INSTRU NAVIGATION (MISCELLANEOUS) ×16 IMPLANT
BLADE IRRIGATOR 40D CVD (IRRIGATION / IRRIGATOR) IMPLANT
CANISTER SUCT 1200ML W/VALVE (MISCELLANEOUS) ×4 IMPLANT
COAG SUCT 10F 3.5MM HAND CTRL (MISCELLANEOUS) ×4 IMPLANT
DEVICE INFLATION SEID (MISCELLANEOUS) IMPLANT
DRAPE HEAD BAR (DRAPES) ×4 IMPLANT
DRESSING NASL FOAM PST OP SINU (MISCELLANEOUS) ×3 IMPLANT
DRSG NASAL 4CM NASOPORE (MISCELLANEOUS) ×8 IMPLANT
DRSG NASAL FOAM POST OP SINU (MISCELLANEOUS) ×4
GAUZE PACK 2X3YD (MISCELLANEOUS) ×4 IMPLANT
GLOVE BIO SURGEON STRL SZ7.5 (GLOVE) ×8 IMPLANT
GLOVE EXAM LX STRL 7.5 (GLOVE) ×8 IMPLANT
IRRIGATOR 4MM STR (IRRIGATION / IRRIGATOR) ×4 IMPLANT
IV NS 500ML (IV SOLUTION) ×1
IV NS 500ML BAXH (IV SOLUTION) ×3 IMPLANT
NAVIGATION MASK REG  ST (MISCELLANEOUS) ×4 IMPLANT
NEEDLE HYPO 25GX1X1/2 BEV (NEEDLE) ×4 IMPLANT
NS IRRIG 500ML POUR BTL (IV SOLUTION) ×4 IMPLANT
PACK DRAPE NASAL/ENT (PACKS) ×4 IMPLANT
PACKING NASAL EPIS 4X2.4 XEROG (MISCELLANEOUS) IMPLANT
PAD GROUND ADULT SPLIT (MISCELLANEOUS) ×4 IMPLANT
PATTIES SURGICAL .5 X3 (DISPOSABLE) ×4 IMPLANT
SET HANDPIECE IRR DIEGO (MISCELLANEOUS) ×4 IMPLANT
SINUPLASTY SPHENOID GUIDE (MISCELLANEOUS) IMPLANT
SOL ANTI-FOG 6CC FOG-OUT (MISCELLANEOUS) ×3 IMPLANT
SOL FOG-OUT ANTI-FOG 6CC (MISCELLANEOUS) ×1
SPLINT NASAL SEPTAL PRE-CUT (MISCELLANEOUS) ×4 IMPLANT
STRAP BODY AND KNEE 60X3 (MISCELLANEOUS) ×4 IMPLANT
SUT CHROMIC 4 0 RB 1X27 (SUTURE) ×4 IMPLANT
SUT ETHILON 3-0 FS-10 30 BLK (SUTURE) ×4
SUT ETHILON 4-0 (SUTURE)
SUT ETHILON 4-0 FS2 18XMFL BLK (SUTURE)
SUT PLAIN GUT 4-0 (SUTURE) ×4 IMPLANT
SUTURE EHLN 3-0 FS-10 30 BLK (SUTURE) ×3 IMPLANT
SUTURE ETHLN 4-0 FS2 18XMF BLK (SUTURE) IMPLANT
SYRINGE 10CC LL (SYRINGE) ×4 IMPLANT
TOWEL OR 17X26 4PK STRL BLUE (TOWEL DISPOSABLE) ×4 IMPLANT
WATER STERILE IRR 500ML POUR (IV SOLUTION) ×4 IMPLANT

## 2014-11-30 NOTE — H&P (Signed)
..  History and Physical paper copy reviewed and updated date of procedure and will be scanned into system.  

## 2014-11-30 NOTE — Anesthesia Procedure Notes (Signed)
Procedure Name: Intubation Date/Time: 11/30/2014 10:10 AM Performed by: Londell Moh Pre-anesthesia Checklist: Patient identified, Emergency Drugs available, Suction available, Patient being monitored and Timeout performed Patient Re-evaluated:Patient Re-evaluated prior to inductionOxygen Delivery Method: Circle system utilized Preoxygenation: Pre-oxygenation with 100% oxygen Intubation Type: IV induction Ventilation: Mask ventilation without difficulty Laryngoscope Size: Mac and 3 Grade View: Grade I Tube type: Oral Rae Tube size: 7.0 mm Number of attempts: 1 Placement Confirmation: ETT inserted through vocal cords under direct vision,  positive ETCO2 and breath sounds checked- equal and bilateral Tube secured with: Tape Dental Injury: Teeth and Oropharynx as per pre-operative assessment  Comments: Pt upper and lower dentures removed and given to or nurse.

## 2014-11-30 NOTE — Anesthesia Postprocedure Evaluation (Signed)
  Anesthesia Post-op Note  Patient: Andrea [name]  Procedure(s) Performed: Procedure(s) with comments: IMAGE GUIDED SINUS SURGERY (N/A) - GAVE DISK TO CE CE SEPTOPLASTY (N/A) MAXILLARY ANTROSTOMY (Bilateral) SPHENOIDECTOMY,  (Left)  Anesthesia type:General  Patient location: PACU  Post pain: Pain level controlled  Post assessment: Post-op Vital signs reviewed, Patient's Cardiovascular Status Stable, Respiratory Function Stable, Patent Airway and No signs of Nausea or vomiting  Post vital signs: Reviewed and stable  Last Vitals:  Filed Vitals:   11/30/14 1245  BP: 115/89  Pulse: 79  Temp:   Resp: 17    Level of consciousness: awake, alert  and patient cooperative  Complications: No apparent anesthesia complications

## 2014-11-30 NOTE — Transfer of Care (Signed)
Immediate Anesthesia Transfer of Care Note  Patient: Andrea [name]  Procedure(s) Performed: Procedure(s) with comments: IMAGE GUIDED SINUS SURGERY (N/A) - GAVE DISK TO CE CE SEPTOPLASTY (N/A) MAXILLARY ANTROSTOMY (Bilateral) SPHENOIDECTOMY,  (Left)  Patient Location: PACU  Anesthesia Type: General  Level of Consciousness: awake, alert  and patient cooperative  Airway and Oxygen Therapy: Patient Spontanous Breathing and Patient connected to supplemental oxygen  Post-op Assessment: Post-op Vital signs reviewed, Patient's Cardiovascular Status Stable, Respiratory Function Stable, Patent Airway and No signs of Nausea or vomiting  Post-op Vital Signs: Reviewed and stable  Complications: No apparent anesthesia complications

## 2014-11-30 NOTE — Op Note (Signed)
..11/30/2014  12:07 PM    Andrea [name]  [id number]    Pre-Op Dx:  Deviated Nasal Septum, Hypertrophic Inferior Turbinates, Nasal Obstruction, Chronic maxillary and sphenoid sinusitis  Post-op Dx: Same  Proc:   1)  Bilateral Maxillary Antrostomy  2)  Nasal Septoplasty  3)  Bilateral Partial Reduction Inferior Turbinates   4)  Left sphenoidotomy  5)  Image Guidance Sinus Surgery  Surg:  VAUGHT,CREIGHTON  Anes:  GOT  EBL:  100  Comp:  none  Findings: Severe inferior turbinate hypertrophy and very large left sided bone and cartilage deviation with complete obstruction and impaction onto inferior turbinate, Large accessory ostia of left maxillary sinus, right OMC narrowing, large ostia created of left sphenoid, Right middle turbinate reduced in size from soft tissue swelling.  Procedure: With the patient in a comfortable supine position,  general orotracheal anesthesia was induced without difficulty.  The patient received preoperative Afrin spray for topical decongestion and vasoconstriction.  At an appropriate level, the patient was placed in a semi-sitting position.  Nasal vibrissae were trimmed.   1% Xylocaine with 1:100,000 epinephrine, 5 cc's, was infiltrated into the anterior floor of the nose, into the nasal spine region, into the membranous columella, and finally into the submucoperichondrial plane of the septum on both sides.  Several minutes were allowed for this to take effect.  Cottoniod pledgetts soaked in Afrin were placed into both nasal cavities and left while the patient was prepped and draped in the standard fashion.   A proper time-out was performed.  The Stryker image guidance system was set up and calibrated in the normal fashion with an acceptable error of 0.86mm.   The materials were removed from the nose and observed to be intact and correct in number.  The nose was inspected with a headlight and zero degree endoscope with the findings as described above.  A  left Killian incision was sharply executed and carried down to the caudal edge of the quadrangular cartilage with a 15 blade scapel.  A mucoperichondrial flap was elelvated along the quadrangular plate back to the bony-cartilaginous junction using caudal elevator and freer elevator. The mucoperiostium was then elevated along the ethmoid plate and the vomer. An itracartilagenous incision was made using the freer elevator and a contralateral mucoperichondiral flap was elevated using a freer elevator.  Care was taken to avoid any large rents or opposing rents in the mucoperichondrial flap.  Boney spurs of the vomer and maxillary crest were removed with Takahashi forceps.  The area of cartilagenous deviation was removed with combination of freer elevator and Takahashi forceps creating a widely patent nasal cavity as well as resolution of obstruction from the cartilagenous deviation. The mucosal flaps were placed back into their anatomic position to allow visualization of the airways. The septum now sat in the midline with an improved airway.  A 4-0 Chromic was used to close the Prestbury incision as well.   The inferior turbinates were then inspected.  Under endoscopic visualization, the inferior turbinates were infractured bilaterally with a Soil scientist.  A kelly clamp was attached to the anterior-inferior third of each inferior turbinate for approximately one minute.  Under endoscopic visualization, Tru-cutting forceps were used to remove the anterior-inferior third of each inferior turbinate.  Electrocautery was used to control bleeding in the area. The remaining turbinate was then outfractured to open up the airway further. There was no significant bleeding noted. The right turbinate was then trimmed and outfractured in a similar fashion.  The  airways were then visualized and showed open passageways on both sides that were significantly improved compared to before surgery.       At this point, attention  was directed to the functional endoscopic sinus surgery aspect of the procedure.  The nose was next inspected with a zero degree endoscope and the middle turbinates were medialized and afrin soaked pledgets were placed lateral to the turbinates for approximately one minute.  The uncinate process was infractured with a maxillary ostia seeker.  A large accessory ostia was noted on the patient's left side.  Using a Diego microdebrider as well as back biting forceps, the uncinate process was removed on the patient's left side first and then right side.   At this time attention was directed to the patient's maxillary sinuses.  On the left, a ball tipped probe was placed through the natural ostia and this was used to create a larger opening.  Using a Diego microdebrider, the maxillary antrostomy was enlarged for a widely patent maxillary antrostomy.  This was repeated in a simlar fashion on the patient's right side.  Hemostasis was performed with topical Afrin soaked pledgets.  Visualization with a zero degree endoscope was used to examine the bilateral maxillary antrostomies which were noted to be widely patent and in continuity with the natural os bilaterally.  Attention at this time was directed to the patient's sphenoid sinuses.  On the patient's left side, the middle turbinate was lateralized and the superior turbinate was identified.  The sphenoid os was visualized and was dilated with image guidance suction device for a widely patent sinus cavity.  At this time with all diseased sinuses opened, the patient's nasal cavity was examinated and copiously irrigated with sterile saline.  Meticulous hemostasis was continued and all sinuses were examined and noted to be widely patent.  Nasopore was placed lateral to the middle turbinates bilaterally and Stamberger sinufoam was placed along the cut edge of the inferior turbinates as well as the anterior middle turbinate bilaterally.  There was no signifcant  bleeding. Nasal splints were applied to both sides of the septum using Xomed 0.75mm regular sized splints that were trimmed, and then held in position with a 3-0 Nylon through and through suture.  Stamberger sinufoam was placed along the cut edge of the inferior turbinates bilaterally.  The patient was turned back over to anesthesia, and awakened, extubated, and taken to the PACU in satisfactory condition.  Dispo:   PACU to home  Plan: Ice, elevation, narcotic analgesia, steroid taper, and prophylactic antibiotics for the duration of indwelling nasal foreign bodies.  We will reevaluate the patient in the office in 6 days and remove the septal splints.  Return to work in 10 days, strenuous activities in two weeks.   VAUGHT,CREIGHTON 11/30/2014 12:07 PM

## 2014-12-02 ENCOUNTER — Emergency Department: Payer: Medicare Other

## 2014-12-02 DIAGNOSIS — Z79899 Other long term (current) drug therapy: Secondary | ICD-10-CM | POA: Diagnosis not present

## 2014-12-02 DIAGNOSIS — R079 Chest pain, unspecified: Secondary | ICD-10-CM | POA: Diagnosis present

## 2014-12-02 DIAGNOSIS — I10 Essential (primary) hypertension: Secondary | ICD-10-CM | POA: Insufficient documentation

## 2014-12-02 DIAGNOSIS — R0600 Dyspnea, unspecified: Secondary | ICD-10-CM | POA: Diagnosis not present

## 2014-12-02 DIAGNOSIS — Z87891 Personal history of nicotine dependence: Secondary | ICD-10-CM | POA: Diagnosis not present

## 2014-12-02 DIAGNOSIS — Z7951 Long term (current) use of inhaled steroids: Secondary | ICD-10-CM | POA: Diagnosis not present

## 2014-12-02 DIAGNOSIS — R0789 Other chest pain: Secondary | ICD-10-CM | POA: Diagnosis not present

## 2014-12-02 LAB — CBC
HEMATOCRIT: 42.4 % (ref 35.0–47.0)
Hemoglobin: 14.3 g/dL (ref 12.0–16.0)
MCH: 28.3 pg (ref 26.0–34.0)
MCHC: 33.7 g/dL (ref 32.0–36.0)
MCV: 84.1 fL (ref 80.0–100.0)
PLATELETS: 246 10*3/uL (ref 150–440)
RBC: 5.04 MIL/uL (ref 3.80–5.20)
RDW: 14.4 % (ref 11.5–14.5)
WBC: 13.6 10*3/uL — ABNORMAL HIGH (ref 3.6–11.0)

## 2014-12-02 LAB — BASIC METABOLIC PANEL
Anion gap: 6 (ref 5–15)
BUN: 17 mg/dL (ref 6–20)
CO2: 26 mmol/L (ref 22–32)
CREATININE: 0.75 mg/dL (ref 0.44–1.00)
Calcium: 9.2 mg/dL (ref 8.9–10.3)
Chloride: 106 mmol/L (ref 101–111)
GFR calc Af Amer: >60 mL/min (ref >60)
GLUCOSE: 134 mg/dL — AB (ref 65–99)
Potassium: 4.1 mmol/L (ref 3.5–5.1)
SODIUM: 138 mmol/L (ref 135–145)

## 2014-12-02 LAB — TROPONIN I

## 2014-12-02 LAB — MAGNESIUM: Magnesium: 2.1 mg/dL (ref 1.7–2.4)

## 2014-12-02 LAB — SURGICAL PATHOLOGY

## 2014-12-02 NOTE — ED Notes (Signed)
Patient reports having sinus surgery on Wednesday.  Tonight feels like heart if beating slow, that she isn't getting enough oxygen and that she is cold on the inside.

## 2014-12-03 ENCOUNTER — Emergency Department
Admission: EM | Admit: 2014-12-03 | Discharge: 2014-12-03 | Disposition: A | Discharge status: Home / Self Care | Payer: Medicare Other | Attending: Emergency Medicine | Admitting: Emergency Medicine

## 2014-12-03 ENCOUNTER — Emergency Department
Admission: EM | Admit: 2014-12-03 | Discharge: 2014-12-03 | Disposition: A | Discharge status: Home / Self Care | Payer: Medicare Other | Source: Home / Self Care | Attending: Emergency Medicine | Admitting: Emergency Medicine

## 2014-12-03 DIAGNOSIS — Z79899 Other long term (current) drug therapy: Secondary | ICD-10-CM | POA: Diagnosis not present

## 2014-12-03 DIAGNOSIS — R0600 Dyspnea, unspecified: Secondary | ICD-10-CM | POA: Diagnosis not present

## 2014-12-03 DIAGNOSIS — R079 Chest pain, unspecified: Secondary | ICD-10-CM | POA: Diagnosis not present

## 2014-12-03 DIAGNOSIS — R0789 Other chest pain: Secondary | ICD-10-CM | POA: Diagnosis not present

## 2014-12-03 DIAGNOSIS — I10 Essential (primary) hypertension: Secondary | ICD-10-CM | POA: Diagnosis not present

## 2014-12-03 DIAGNOSIS — Z7951 Long term (current) use of inhaled steroids: Secondary | ICD-10-CM | POA: Diagnosis not present

## 2014-12-03 DIAGNOSIS — Z87891 Personal history of nicotine dependence: Secondary | ICD-10-CM | POA: Diagnosis not present

## 2014-12-03 LAB — TROPONIN I: Troponin I: <0.03 ng/mL (ref <0.031)

## 2014-12-03 NOTE — ED Provider Notes (Signed)
[facility] Emergency Department Provider Note  ____________________________________________  Time seen: Approximately 7:57 AM  I have reviewed the triage vital signs and the nursing notes.   HISTORY  Chief Complaint Chest Pain   HPI Andrea [name] is a 49 y.o. female is here with complaint of chest pain. Patient was here last evening with same complaint and left prior to being seen. Patient states that she woke up was smiling to arthritic o'clock with the same sensation. She then called EMS who gave  nitroglycerin spray and aspirin which has relieved her chest pain. She states she has had similar problems and nitroglycerin helps. Currently she is seeing a cardiologist and has an appointment this month to be seen again. She also recently had sinus surgery by Dr. Pryor Ochoa on Wednesday.  Patient states her pain is 1 out of 10 currently. Patient denies any chest pain, shortness of breath, dizziness, indigestion or diaphoresis at this time. Patient states that she has pain medication at home from her sinus surgery but is not taking any medication.   Past Medical History  Diagnosis Date  . PONV (postoperative nausea and vomiting)   . Arthritis     fingers  . COPD (chronic obstructive pulmonary disease)   . Emphysema/COPD   . GERD (gastroesophageal reflux disease)   . Anxiety     panic attacks, chest pain  . Hypertension   . Arrhythmia   . Wears dentures     partial upper and lower    Patient Active Problem List   Diagnosis Date Noted  . COPD exacerbation 10/04/2014  . Anxiety 09/22/2014  . Colon polyp 09/22/2014  . Depression 09/22/2014  . Dyshidrosis 09/22/2014  . GERD (gastroesophageal reflux disease) 09/22/2014  . Insomnia 09/22/2014  . Lung nodule, multiple 09/22/2014  . Panic disorder 09/22/2014  . Chest pain 01/17/2014  . Palpitations 01/17/2014  . Pulmonary nodule 06/10/2013  . Shortness of breath 05/20/2013  . COPD, GOLD B 05/20/2013  .  Tobacco abuse 05/20/2013  . Daytime somnolence 05/20/2013  . Discoid lupus 05/30/2012  . Fibromyalgia 02/11/2012  . PVC's (premature ventricular contractions) 01/29/2011  . Essential (primary) hypertension 03/04/1998    Past Surgical History  Procedure Laterality Date  . Vaginal hysterectomy  1995    vaginal  . Cardiac catheterization  01/03/14  . Image guided sinus surgery N/A 11/30/2014    Procedure: IMAGE GUIDED SINUS SURGERY;  Surgeon: Carloyn Manner, MD;  Location: [place];  Service: ENT;  Laterality: N/A;  GAVE DISK TO CE CE  . Septoplasty N/A 11/30/2014    Procedure: SEPTOPLASTY;  Surgeon: Carloyn Manner, MD;  Location: [place];  Service: ENT;  Laterality: N/A;  . Maxillary antrostomy Bilateral 11/30/2014    Procedure: MAXILLARY ANTROSTOMY;  Surgeon: Carloyn Manner, MD;  Location: [place];  Service: ENT;  Laterality: Bilateral;  . Sphenoidectomy Left 11/30/2014    Procedure: Coralee Pesa, ;  Surgeon: Carloyn Manner, MD;  Location: [place];  Service: ENT;  Laterality: Left;    Current Outpatient Rx  Name  Route  Sig  Dispense  Refill  . albuterol (PROVENTIL HFA;VENTOLIN HFA) 108 (90 BASE) MCG/ACT inhaler   Inhalation   Inhale 2 puffs into the lungs every 6 (six) hours as needed for wheezing or shortness of breath.   1 Inhaler   3   . albuterol (PROVENTIL) (2.5 MG/3ML) 0.083% nebulizer solution   Nebulization   Take 2.5 mg by nebulization every 4 (four) hours as needed for  wheezing or shortness of breath.         . ALPRAZolam (XANAX) 1 MG tablet      TAKE 1 TABLET BY MOUTH EVERY 8 HOURS AS NEEDED   60 tablet   1   . ARIPiprazole (ABILIFY) 5 MG tablet   Oral   Take 5 mg by mouth daily.         . budesonide-formoterol (SYMBICORT) 160-4.5 MCG/ACT inhaler   Inhalation   Inhale 1 puff into the lungs. Up to 3 times a day         . DULoxetine (CYMBALTA) 60 MG capsule   Oral   Take 60 mg by mouth daily.          . fluticasone (FLONASE) 50 MCG/ACT nasal spray   Each Nare   Place 2 sprays into both nostrils daily.   16 g   6   . hydroxychloroquine (PLAQUENIL) 200 MG tablet      TAKE 2 TABLETS BY MOUTH DAILY   60 tablet   5   . lansoprazole (PREVACID) 30 MG capsule   Oral   Take 30 mg by mouth 2 (two) times daily before a meal.         . nitroGLYCERIN (NITROSTAT) 0.4 MG SL tablet   Sublingual   Place 0.4 mg under the tongue every 5 (five) minutes as needed for chest pain.         Marland Kitchen oxyCODONE-acetaminophen (ROXICET) 5-325 MG tablet   Oral   Take 1-2 tablets by mouth every 4 (four) hours as needed for severe pain.   40 tablet   0   . predniSONE (STERAPRED UNI-PAK 21 TAB) 10 MG (21) TBPK tablet      Sterapred DS 6 day taper   21 tablet   0   . promethazine (PHENERGAN) 12.5 MG tablet   Oral   Take 1 tablet (12.5 mg total) by mouth every 6 (six) hours as needed for nausea or vomiting.   30 tablet   0   . propranolol (INDERAL) 20 MG tablet   Oral   Take 20 mg by mouth 2 (two) times daily.         Marland Kitchen Respiratory Therapy Supplies (FLUTTER) DEVI   Does not apply   1 Device by Does not apply route daily.   1 each   0   . sulfamethoxazole-trimethoprim (BACTRIM DS,SEPTRA DS) 800-160 MG tablet   Oral   Take 1 tablet by mouth 2 (two) times daily.   20 tablet   0   . tiotropium (SPIRIVA) 18 MCG inhalation capsule   Inhalation   Place 18 mcg into inhaler and inhale daily.         . TraZODone HCl 150 MG TB24   Oral   Take 150 mg by mouth at bedtime as needed.           Allergies Alum & mag hydroxide-simeth; Cefdinir; Doxycycline; and Ivp dye  Family History  Problem Relation Age of Onset  . Emphysema Father   . Asthma Father   . Heart disease Father   . Cancer Father     colon  . Heart attack Father   . Arthritis Father     RA  . Hypertension Mother     Social History Social History  Substance Use Topics  . Smoking status: Former Smoker -- 0.00  packs/day for 16 years    Types: Cigarettes    Quit date: 11/16/2014  . Smokeless tobacco: Never Used  Comment: occasional cigarette since quitting last week  . Alcohol Use: No    Review of Systems Constitutional: No fever/chills Eyes: No visual changes. ENT: No sore throat. Cardiovascular: Resolved chest pain. Respiratory: Denies shortness of breath. Gastrointestinal: No abdominal pain.  No nausea, no vomiting.  Genitourinary: Negative for dysuria. Musculoskeletal: Negative for back pain. Skin: Negative for rash. Neurological: Negative for headaches, focal weakness or numbness.  10-point ROS otherwise negative.  ____________________________________________   PHYSICAL EXAM:  VITAL SIGNS: ED Triage Vitals  Enc Vitals Group     BP 12/03/14 0553 128/70 mmHg     Pulse Rate 12/03/14 0553 82     Resp 12/03/14 0553 18     Temp 12/03/14 0553 97.6 F (36.4 C)     Temp Source 12/03/14 0553 Oral     SpO2 12/03/14 0553 94 %     Weight 12/03/14 0553 150 lb (68.04 kg)     Height 12/03/14 0553 5\' 3"  (1.6 m)     Head Cir --      Peak Flow --      Pain Score --      Pain Loc --      Pain Edu? --      Excl. in [place]? --     Constitutional: Alert and oriented. Well appearing and in no acute distress. Eyes: Conjunctivae are normal. PERRL. EOMI. Head: Atraumatic. Nose: No congestion/rhinnorhea. Mouth/Throat: Mucous membranes are moist.  Oropharynx non-erythematous. Neck: No stridor Cardiovascular: Normal rate, regular rhythm. Grossly normal heart sounds.  Good peripheral circulation. Respiratory: Normal respiratory effort.  No retractions. Lungs CTAB. Gastrointestinal: Soft and nontender. No epigastric tenderness is noted. No distention. No abdominal bruits. Sounds are normoactive 4 quadrants Musculoskeletal: No gross deformity of the torso anterior or posterior. Range of motion is unrestricted and patient denies any pain with range of motion. Currently patient is pain-free. No  lower extremity tenderness nor edema.  No joint effusions. Neurologic:  Normal speech and language. No gross focal neurologic deficits are appreciated. No gait instability. Skin:  Skin is warm, dry and intact. No rash noted. Psychiatric: Mood and affect are normal. Speech and behavior are normal.  ____________________________________________   LABS (all labs ordered are listed, but only abnormal results are displayed)  Labs Reviewed  TROPONIN I   ____________________________________________  EKG  Per Dr. Burlene Arnt ____________________________________________  RADIOLOGY  Deferred ____________________________________________   PROCEDURES  Procedure(s) performed: None  Critical Care performed: No  ____________________________________________   INITIAL IMPRESSION / ASSESSMENT AND PLAN / ED COURSE  Pertinent labs & imaging results that were available during my care of the patient were reviewed by me and considered in my medical decision making (see chart for details).  Chest chest wall pain with patient. Patient has an appointment with her cardiologist this month. She has pain medication at home that she has not taken and she was encouraged to take as needed. She also has sublingual nitroglycerin to take when necessary. Troponin last evening and this morning are negative. Patient was told to return if any worsening of her symptoms or urgent concerns. ____________________________________________   FINAL CLINICAL IMPRESSION(S) / ED DIAGNOSES  Final diagnoses:  Chest wall pain  resolved    Johnn Hai, PA-C 12/03/14 [place], MD 12/03/14 1538

## 2014-12-03 NOTE — ED Notes (Signed)
Patient to triage via wheelchair by EMS from home.  Per EMS patient reports chest pain that woke her from sleep.  Per EMS patient given NGT spray x2 with some relief and aspirin 324mg  by mouth.

## 2014-12-03 NOTE — Discharge Instructions (Signed)
Chest Wall Pain °Chest wall pain is pain felt in or around the chest bones and muscles. It may take up to 6 weeks to get better. It may take longer if you are active. Chest wall pain can happen on its own. Other times, things like germs, injury, coughing, or exercise can cause the pain. °HOME CARE  °· Avoid activities that make you tired or cause pain. Try not to use your chest, belly (abdominal), or side muscles. Do not use heavy weights. °· Put ice on the sore area. °· Put ice in a plastic bag. °· Place a towel between your skin and the bag. °· Leave the ice on for 15-20 minutes for the first 2 days. °· Only take medicine as told by your doctor. °GET HELP RIGHT AWAY IF:  °· You have more pain or are very uncomfortable. °· You have a fever. °· Your chest pain gets worse. °· You have new problems. °· You feel sick to your stomach (nauseous) or throw up (vomit). °· You start to sweat or feel lightheaded. °· You have a cough with mucus (phlegm). °· You cough up blood. °MAKE SURE YOU:  °· Understand these instructions. °· Will watch your condition. °· Will get help right away if you are not doing well or get worse. °Document Released: 08/07/2007 Document Revised: 05/13/2011 Document Reviewed: 10/15/2010 °ExitCare® Patient Information ©2015 [employer]. This information is not intended to replace advice given to you by your health care provider. Make sure you discuss any questions you have with your health care provider. ° °Chest Pain (Nonspecific) °It is often hard to give a diagnosis for the cause of chest pain. There is always a chance that your pain could be related to something serious, such as a heart attack or a blood clot in the lungs. You need to follow up with your doctor. °HOME CARE °· If antibiotic medicine was given, take it as directed by your doctor. Finish the medicine even if you start to feel better. °· For the next few days, avoid activities that bring on chest pain. Continue physical activities as  told by your doctor. °· Do not use any tobacco products. This includes cigarettes, chewing tobacco, and e-cigarettes. °· Avoid drinking alcohol. °· Only take medicine as told by your doctor. °· Follow your doctor's suggestions for more testing if your chest pain does not go away. °· Keep all doctor visits you made. °GET HELP IF: °· Your chest pain does not go away, even after treatment. °· You have a rash with blisters on your chest. °· You have a fever. °GET HELP RIGHT AWAY IF:  °· You have more pain or pain that spreads to your arm, neck, jaw, back, or belly (abdomen). °· You have shortness of breath. °· You cough more than usual or cough up blood. °· You have very bad back or belly pain. °· You feel sick to your stomach (nauseous) or throw up (vomit). °· You have very bad weakness. °· You pass out (faint). °· You have chills. °This is an emergency. Do not wait to see if the problems will go away. Call your local emergency services (911 in [place] Do not drive yourself to the hospital. °MAKE SURE YOU:  °· Understand these instructions. °· Will watch your condition. °· Will get help right away if you are not doing well or get worse. °Document Released: 08/07/2007 Document Revised: 02/23/2013 Document Reviewed: 08/07/2007 °Exit[employer]® Patient Information ©2015 [employer]. This information is not intended to   replace advice given to you by your health care provider. Make sure you discuss any questions you have with your health care provider.    Keep her appointment with her cardiologist this month. Use nitroglycerin that you have at  home as needed for chest pain. You can also continue using her pain medication from your sinus surgery as needed Return to the emergency room if any urgent concerns or worsening of her symptoms.

## 2014-12-03 NOTE — ED Notes (Signed)
Pt arrived via EMS for complaints of chest pain. Pt rated pain at that time a 10/10. Pt denies shortness of breath or nausea and vomiting. Pt reports that she did feel dizzy. Pt states EMS administered nitroglycerin and aspirin. Pt reports pain as a 1/10 currently.

## 2014-12-08 ENCOUNTER — Encounter: Payer: Self-pay | Admitting: Cardiovascular Disease

## 2014-12-08 ENCOUNTER — Ambulatory Visit (INDEPENDENT_AMBULATORY_CARE_PROVIDER_SITE_OTHER): Payer: Medicare Other | Admitting: Cardiovascular Disease

## 2014-12-08 VITALS — BP 100/80 | HR 84 | Ht 63.0 in | Wt 151.2 lb

## 2014-12-08 DIAGNOSIS — I10 Essential (primary) hypertension: Secondary | ICD-10-CM

## 2014-12-08 DIAGNOSIS — Z720 Tobacco use: Secondary | ICD-10-CM

## 2014-12-08 DIAGNOSIS — K219 Gastro-esophageal reflux disease without esophagitis: Secondary | ICD-10-CM

## 2014-12-08 MED ORDER — NITROGLYCERIN 0.4 MG SL SUBL: 0.4000 mg | SUBLINGUAL_TABLET | SUBLINGUAL | Status: AC | PRN

## 2014-12-08 NOTE — Assessment & Plan Note (Signed)
I again discussed with her the importance of smoking cessation. 

## 2014-12-08 NOTE — Assessment & Plan Note (Signed)
Blood pressure is controlled on current medications. 

## 2014-12-08 NOTE — Assessment & Plan Note (Signed)
The patient's chest pain is overall atypical and I don't think it is cardiac. She had a cardiac catheterization done last year that showed only minor irregularities. The history is highly suggestive of possible esophageal spasms related to GERD. This occasionally responds well to sublingual nitroglycerin and I provided her with this. If the episodes become more frequent, she might require further GI evaluation.

## 2014-12-08 NOTE — Patient Instructions (Signed)
Medication Instructions:  Your physician recommends that you continue on your current medications as directed. Please refer to the Current Medication list given to you today.   Labwork: none  Testing/Procedures: none  Follow-Up: Your physician recommends that you schedule a follow-up appointment as needed with Dr. Fletcher Anon.    Any Other Special Instructions Will Be Listed Below (If Applicable).

## 2014-12-08 NOTE — Progress Notes (Signed)
Primary care physician: Dr. Caryn Section  HPI  This is a pleasant 49 year old female who is here today for a follow-up visit regarding chest pain and palpitations.  Cardiac catheterization in November 2015 showed minor irregularities, mildly elevated left ventricular end-diastolic pressure and normal ejection fraction. She was advised to quit smoking. For palpitations, Holter monitor was requested. She went to the emergency room early this month with chest pain which was felt to be musculoskeletal. ECG and troponins were unremarkable. She reports that the pain was severe and woke her out of from sleep at 4:00 in the morning. It was described as tightness with radiation to the right jaw. It did not respond to the first nitroglycerin but resolved after the second one. She describes 2-3 episodes like this over the last year. During these episodes, she become very anxious. She continues to smoke half a pack per day.   Allergies  Allergen Reactions  . Alum & Mag Hydroxide-Simeth Diarrhea and Nausea Only  . Cefdinir     tachycardia  . Doxycycline     Nausea, migraine  . Ivp Dye [Iodinated Diagnostic Agents]     Heart palpitations     Current Outpatient Prescriptions on File Prior to Visit  Medication Sig Dispense Refill  . albuterol (PROVENTIL HFA;VENTOLIN HFA) 108 (90 BASE) MCG/ACT inhaler Inhale 2 puffs into the lungs every 6 (six) hours as needed for wheezing or shortness of breath. 1 Inhaler 3  . albuterol (PROVENTIL) (2.5 MG/3ML) 0.083% nebulizer solution Take 2.5 mg by nebulization every 4 (four) hours as needed for wheezing or shortness of breath.    . ALPRAZolam (XANAX) 1 MG tablet TAKE 1 TABLET BY MOUTH EVERY 8 HOURS AS NEEDED 60 tablet 1  . ARIPiprazole (ABILIFY) 5 MG tablet Take 5 mg by mouth daily.    . budesonide-formoterol (SYMBICORT) 160-4.5 MCG/ACT inhaler Inhale 1 puff into the lungs. Up to 3 times a day    . DULoxetine (CYMBALTA) 60 MG capsule Take 60 mg by mouth daily.    .  hydroxychloroquine (PLAQUENIL) 200 MG tablet TAKE 2 TABLETS BY MOUTH DAILY 60 tablet 5  . lansoprazole (PREVACID) 30 MG capsule Take 30 mg by mouth 2 (two) times daily before a meal.    . propranolol (INDERAL) 20 MG tablet Take 20 mg by mouth 2 (two) times daily.    Marland Kitchen Respiratory Therapy Supplies (FLUTTER) DEVI 1 Device by Does not apply route daily. 1 each 0  . tiotropium (SPIRIVA) 18 MCG inhalation capsule Place 18 mcg into inhaler and inhale daily.    . TraZODone HCl 150 MG TB24 Take 150 mg by mouth at bedtime as needed.     No current facility-administered medications on file prior to visit.     Past Medical History  Diagnosis Date  . PONV (postoperative nausea and vomiting)   . Arthritis     fingers  . COPD (chronic obstructive pulmonary disease) ([place])   . Emphysema/COPD ([place])   . GERD (gastroesophageal reflux disease)   . Anxiety     panic attacks, chest pain  . Hypertension   . Arrhythmia   . Wears dentures     partial upper and lower     Past Surgical History  Procedure Laterality Date  . Vaginal hysterectomy  1995    vaginal  . Cardiac catheterization  01/03/14  . Image guided sinus surgery N/A 11/30/2014    Procedure: IMAGE GUIDED SINUS SURGERY;  Surgeon: Carloyn Manner, MD;  Location: [place];  Service: ENT;  Laterality: N/A;  GAVE DISK TO CE CE  . Septoplasty N/A 11/30/2014    Procedure: SEPTOPLASTY;  Surgeon: Carloyn Manner, MD;  Location: [place];  Service: ENT;  Laterality: N/A;  . Maxillary antrostomy Bilateral 11/30/2014    Procedure: MAXILLARY ANTROSTOMY;  Surgeon: Carloyn Manner, MD;  Location: [place];  Service: ENT;  Laterality: Bilateral;  . Sphenoidectomy Left 11/30/2014    Procedure: Coralee Pesa, ;  Surgeon: Carloyn Manner, MD;  Location: [place];  Service: ENT;  Laterality: Left;     Family History  Problem Relation Age of Onset  . Emphysema Father   . Asthma Father   . Heart disease Father    . Cancer Father     colon  . Heart attack Father   . Arthritis Father     RA  . Hypertension Mother      Social History   Social History  . Marital Status: Legally Separated    Spouse Name: N/A  . Number of Children: 3  . Years of Education: N/A   Occupational History  . Disabled     Social Security as of 05/21/2011   Social History Main Topics  . Smoking status: Former Smoker -- 0.00 packs/day for 16 years    Types: Cigarettes    Quit date: 11/16/2014  . Smokeless tobacco: Never Used     Comment: occasional cigarette since quitting last week  . Alcohol Use: No  . Drug Use: No  . Sexual Activity: Not on file   Other Topics Concern  . Not on file   Social History Narrative     ROS A 10 point review of system was performed. It is negative other than that mentioned in the history of present illness.   PHYSICAL EXAM   BP 100/80 mmHg  Pulse 84  Ht 5\' 3"  (1.6 m)  Wt 151 lb 4 oz (68.607 kg)  BMI 26.80 kg/m2 Constitutional: She is oriented to person, place, and time. She appears well-developed and well-nourished. No distress.  HENT: No nasal discharge.  Head: Normocephalic and atraumatic.  Eyes: Pupils are equal and round. No discharge.  Neck: Normal range of motion. Neck supple. No JVD present. No thyromegaly present.  Cardiovascular: Normal rate, regular rhythm, normal heart sounds. Exam reveals no gallop and no friction rub. No murmur heard.  Pulmonary/Chest: Effort normal and breath sounds normal. No stridor. No respiratory distress. She has no wheezes. She has no rales. She exhibits no tenderness.  Abdominal: Soft. Bowel sounds are normal. She exhibits no distension. There is no tenderness. There is no rebound and no guarding.  Musculoskeletal: Normal range of motion. She exhibits no edema and no tenderness.  Neurological: She is alert and oriented to person, place, and time. Coordination normal.  Skin: Skin is warm and dry. No rash noted. She is not  diaphoretic. No erythema. No pallor.  Psychiatric: She has a normal mood and affect. Her behavior is normal. Judgment and thought content normal.    Normal sinus rhythm with no significant ST or T wave changes. Slightly inverted anterior T waves likely a normal variant and unchanged from before.   ASSESSMENT AND PLAN

## 2014-12-09 DIAGNOSIS — J342 Deviated nasal septum: Secondary | ICD-10-CM | POA: Diagnosis not present

## 2014-12-09 DIAGNOSIS — J343 Hypertrophy of nasal turbinates: Secondary | ICD-10-CM | POA: Diagnosis not present

## 2014-12-21 ENCOUNTER — Emergency Department: Payer: Medicare Other

## 2014-12-21 ENCOUNTER — Encounter: Payer: Self-pay | Admitting: *Deleted

## 2014-12-21 ENCOUNTER — Emergency Department
Admission: EM | Admit: 2014-12-21 | Discharge: 2014-12-21 | Disposition: A | Discharge status: Home / Self Care | Payer: Medicare Other | Attending: Emergency Medicine | Admitting: Emergency Medicine

## 2014-12-21 DIAGNOSIS — R05 Cough: Secondary | ICD-10-CM | POA: Diagnosis not present

## 2014-12-21 DIAGNOSIS — Z720 Tobacco use: Secondary | ICD-10-CM | POA: Diagnosis not present

## 2014-12-21 DIAGNOSIS — Z7951 Long term (current) use of inhaled steroids: Secondary | ICD-10-CM | POA: Diagnosis not present

## 2014-12-21 DIAGNOSIS — J441 Chronic obstructive pulmonary disease with (acute) exacerbation: Secondary | ICD-10-CM | POA: Diagnosis not present

## 2014-12-21 DIAGNOSIS — I10 Essential (primary) hypertension: Secondary | ICD-10-CM | POA: Insufficient documentation

## 2014-12-21 DIAGNOSIS — Z79899 Other long term (current) drug therapy: Secondary | ICD-10-CM | POA: Diagnosis not present

## 2014-12-21 DIAGNOSIS — F17210 Nicotine dependence, cigarettes, uncomplicated: Secondary | ICD-10-CM | POA: Diagnosis not present

## 2014-12-21 DIAGNOSIS — R0602 Shortness of breath: Secondary | ICD-10-CM | POA: Diagnosis not present

## 2014-12-21 LAB — BASIC METABOLIC PANEL
ANION GAP: 8 (ref 5–15)
BUN: 14 mg/dL (ref 6–20)
CALCIUM: 9.5 mg/dL (ref 8.9–10.3)
CO2: 28 mmol/L (ref 22–32)
Chloride: 101 mmol/L (ref 101–111)
Creatinine, Ser: 0.83 mg/dL (ref 0.44–1.00)
Glucose, Bld: 90 mg/dL (ref 65–99)
Potassium: 4.3 mmol/L (ref 3.5–5.1)
SODIUM: 137 mmol/L (ref 135–145)

## 2014-12-21 LAB — CBC
HEMATOCRIT: 42 % (ref 35.0–47.0)
Hemoglobin: 14.1 g/dL (ref 12.0–16.0)
MCH: 27.9 pg (ref 26.0–34.0)
MCHC: 33.5 g/dL (ref 32.0–36.0)
MCV: 83.1 fL (ref 80.0–100.0)
Platelets: 245 10*3/uL (ref 150–440)
RBC: 5.06 MIL/uL (ref 3.80–5.20)
RDW: 14.3 % (ref 11.5–14.5)
WBC: 9.3 10*3/uL (ref 3.6–11.0)

## 2014-12-21 LAB — TROPONIN I

## 2014-12-21 MED ORDER — ALBUTEROL SULFATE (2.5 MG/3ML) 0.083% IN NEBU
5.0000 mg | INHALATION_SOLUTION | Freq: Once | RESPIRATORY_TRACT | Status: AC
  Administered 2014-12-21: 5 mg via RESPIRATORY_TRACT
  Filled 2014-12-21: qty 6

## 2014-12-21 MED ORDER — PREDNISONE 20 MG PO TABS
60.0000 mg | ORAL_TABLET | Freq: Every day | ORAL | Status: DC
Start: 2014-12-21 — End: 2015-03-19

## 2014-12-21 NOTE — ED Notes (Signed)
Pt ambulatory to triage.  Pt has sob for 2 days. hx copd pt reports sx for 2 days.   No chest pain.  States it hurts between shoulder blades.   Using inhaler without relief.  Pt alert.  Speech clear.

## 2014-12-21 NOTE — ED Provider Notes (Addendum)
[facility] Emergency Department Provider Note  ____________________________________________   I have reviewed the triage vital signs and the nursing notes.   HISTORY  Chief Complaint Shortness of Breath and Cough    HPI Andrea [name] is a 49 y.o. female with a history of COPD and continued smoking, was had 2 negative workups for pulmonary embolism this year alone, presents today with COPD symptoms of cough and wheeze. She has had cough and wheeze for 2 days. Cough is nonproductive. Denies fever. It hurts to cough sometimes. She states she is compliant with her home medications. No leg swelling no recent travel no history of PE or DVT  Past Medical History  Diagnosis Date  . PONV (postoperative nausea and vomiting)   . Arthritis     fingers  . COPD (chronic obstructive pulmonary disease) ([place])   . Emphysema/COPD ([place])   . GERD (gastroesophageal reflux disease)   . Anxiety     panic attacks, chest pain  . Hypertension   . Arrhythmia   . Wears dentures     partial upper and lower    Patient Active Problem List   Diagnosis Date Noted  . COPD exacerbation ([place]) 10/04/2014  . Anxiety 09/22/2014  . Colon polyp 09/22/2014  . Depression 09/22/2014  . Dyshidrosis 09/22/2014  . GERD (gastroesophageal reflux disease) 09/22/2014  . Insomnia 09/22/2014  . Lung nodule, multiple 09/22/2014  . Panic disorder 09/22/2014  . Chest pain 01/17/2014  . Palpitations 01/17/2014  . Pulmonary nodule 06/10/2013  . Shortness of breath 05/20/2013  . COPD, GOLD B 05/20/2013  . Tobacco abuse 05/20/2013  . Daytime somnolence 05/20/2013  . Discoid lupus 05/30/2012  . Fibromyalgia 02/11/2012  . PVC's (premature ventricular contractions) 01/29/2011  . Essential (primary) hypertension 03/04/1998    Past Surgical History  Procedure Laterality Date  . Vaginal hysterectomy  1995    vaginal  . Cardiac catheterization  01/03/14  . Image guided sinus surgery N/A 11/30/2014     Procedure: IMAGE GUIDED SINUS SURGERY;  Surgeon: Carloyn Manner, MD;  Location: [place];  Service: ENT;  Laterality: N/A;  GAVE DISK TO CE CE  . Septoplasty N/A 11/30/2014    Procedure: SEPTOPLASTY;  Surgeon: Carloyn Manner, MD;  Location: [place];  Service: ENT;  Laterality: N/A;  . Maxillary antrostomy Bilateral 11/30/2014    Procedure: MAXILLARY ANTROSTOMY;  Surgeon: Carloyn Manner, MD;  Location: [place];  Service: ENT;  Laterality: Bilateral;  . Sphenoidectomy Left 11/30/2014    Procedure: Coralee Pesa, ;  Surgeon: Carloyn Manner, MD;  Location: [place];  Service: ENT;  Laterality: Left;    Current Outpatient Rx  Name  Route  Sig  Dispense  Refill  . albuterol (PROVENTIL HFA;VENTOLIN HFA) 108 (90 BASE) MCG/ACT inhaler   Inhalation   Inhale 2 puffs into the lungs every 6 (six) hours as needed for wheezing or shortness of breath.   1 Inhaler   3   . albuterol (PROVENTIL) (2.5 MG/3ML) 0.083% nebulizer solution   Nebulization   Take 2.5 mg by nebulization every 4 (four) hours as needed for wheezing or shortness of breath.         . ALPRAZolam (XANAX) 1 MG tablet      TAKE 1 TABLET BY MOUTH EVERY 8 HOURS AS NEEDED   60 tablet   1   . ARIPiprazole (ABILIFY) 5 MG tablet   Oral   Take 5 mg by mouth daily.         Marland Kitchen  budesonide-formoterol (SYMBICORT) 160-4.5 MCG/ACT inhaler   Inhalation   Inhale 1 puff into the lungs. Up to 3 times a day         . DULoxetine (CYMBALTA) 60 MG capsule   Oral   Take 60 mg by mouth daily.         . hydroxychloroquine (PLAQUENIL) 200 MG tablet      TAKE 2 TABLETS BY MOUTH DAILY   60 tablet   5   . lansoprazole (PREVACID) 30 MG capsule   Oral   Take 30 mg by mouth 2 (two) times daily before a meal.         . nitroGLYCERIN (NITROSTAT) 0.4 MG SL tablet   Sublingual   Place 1 tablet (0.4 mg total) under the tongue every 5 (five) minutes as needed for chest pain.   25 tablet    0   . propranolol (INDERAL) 20 MG tablet   Oral   Take 20 mg by mouth 2 (two) times daily.         Marland Kitchen Respiratory Therapy Supplies (FLUTTER) DEVI   Does not apply   1 Device by Does not apply route daily.   1 each   0   . tiotropium (SPIRIVA) 18 MCG inhalation capsule   Inhalation   Place 18 mcg into inhaler and inhale daily.         . TraZODone HCl 150 MG TB24   Oral   Take 150 mg by mouth at bedtime as needed.           Allergies Alum & mag hydroxide-simeth; Cefdinir; Doxycycline; and Ivp dye  Family History  Problem Relation Age of Onset  . Emphysema Father   . Asthma Father   . Heart disease Father   . Cancer Father     colon  . Heart attack Father   . Arthritis Father     RA  . Hypertension Mother     Social History Social History  Substance Use Topics  . Smoking status: Current Every Day Smoker -- 0.00 packs/day for 16 years    Types: Cigarettes    Last Attempt to Quit: 11/16/2014  . Smokeless tobacco: Never Used     Comment: occasional cigarette since quitting last week  . Alcohol Use: No    Review of Systems Constitutional: No fever/chills Eyes: No visual changes. ENT: No sore throat. No stiff neck no neck pain Cardiovascular: Denies chest pain. Resp Positive shortness of breath. Gastrointestinal:   no vomiting.  No diarrhea.  No constipation. Genitourinary: Negative for dysuria. Musculoskeletal: Negative lower extremity swelling Skin: Negative for rash. Neurological: Negative for headaches, focal weakness or numbness. 10-point ROS otherwise negative.  ____________________________________________   PHYSICAL EXAM:  VITAL SIGNS: ED Triage Vitals  Enc Vitals Group     BP 12/21/14 1606 120/79 mmHg     Pulse Rate 12/21/14 1606 115     Resp 12/21/14 1606 22     Temp 12/21/14 1606 98 F (36.7 C)     Temp Source 12/21/14 1606 Oral     SpO2 12/21/14 1606 95 %     Weight 12/21/14 1606 150 lb (68.04 kg)     Height 12/21/14 1606 5\' 3"   (1.6 m)     Head Cir --      Peak Flow --      Pain Score 12/21/14 1607 7     Pain Loc --      Pain Edu? --      Excl.  in [place]? --     Constitutional: Alert and oriented. Well appearing and in no acute distress. smells strongly of tobacco smoke  Eyes: Conjunctivae are normal. PERRL. EOMI. Head: Atraumatic. Nose: No congestion/rhinnorhea. Mouth/Throat: Mucous membranes are moist.  Oropharynx non-erythematous. Neck: No stridor.   Nontender with no meningismus Cardiovascular: Normal rate, regular rhythm. Grossly normal heart sounds.  Good peripheral circulation. RespiratorOccasional wheeze diminished in the bases no rales rhonchi or rubs. No significant increase in work of breathing Gastrointestinal: Soft and nontender. No distention. No guarding no rebound Back:  There is no focal tenderness or step off there is no midline tenderness there are no lesions noted. there is no CVA tenderness Musculoskeletal: No lower extremity tenderness. No joint effusions, no DVT signs strong distal pulses no edema Neurologic:  Normal speech and language. No gross focal neurologic deficits are appreciated.  Skin:  Skin is warm, dry and intact. No rash noted. Psychiatric: Mood and affect are normal. Speech and behavior are normal.  ____________________________________________   LABS (all labs ordered are listed, but only abnormal results are displayed)  Labs Reviewed  CBC  BASIC METABOLIC PANEL  TROPONIN I   ____________________________________________  EKG  I personally interpreted EKG, done by triage, sinus tachycardia noted rate 105 bpm, nonspecific ST changes no ST elevation or depression normal axis  ____________________________________________  RADIOLOGY  Personally reviewed  ____________________________________________   PROCEDURES  Procedure(s) performed: None  Critical Care performed: None  ____________________________________________   INITIAL IMPRESSION / ASSESSMENT AND  PLAN / ED COURSE  Pertinent labs & imaging results that were available during my care of the patient were reviewed by me and considered in my medical decision making (see chart for details).   patient continues to smoke and continues to have COPD symptoms other is all. We will start her on albuterol here. I'm reluctant to give her steroids as she was just on steroids 3 weeks ago but she may need them and if she does we will administer them. She is nontoxic. Do not think this her presents PE CAD ACS myocarditis endocarditis pericarditis pneumothorax or pneumonia. Chest x-ray is reassuring. Blood work is reassuring vital signs are reassuring exam is reassuring and consistent with multiple prior presentations for similar. Patient likely will need a pulmonologist. I have advised her to quit smoking if she wants to avoid a lifetime of breathing issues.  ___________________________________________ ----------------------------------------- 7:38 PM on 12/21/2014 -----------------------------------------  Lungs are clear at this time patient in no acute distress chest x-ray is reassuring blood work reassuring, I have advised her that repeated steroid use is not good for her. She likely send her home with a prescription in case her symptoms worsen. I will do so although again out prefer not to use steroids if they can be avoided. Patient is eager to go home at this time. I have again counseled her to quit smoking.  FINAL CLINICAL IMPRESSION(S) / ED DIAGNOSES  Final diagnoses:  None     Schuyler Amor, MD 12/21/14 [place], MD 12/21/14 [phone]

## 2014-12-21 NOTE — Discharge Instructions (Signed)
Return to the emergency room if you have any trouble breathing, if you do not stop smoking, your lungs will continue to deteriorate and your life will be a constant battle for air. If you have fever chills shortness of breath chest pain refill worse in any way return to the emergency room.

## 2014-12-23 ENCOUNTER — Ambulatory Visit: Payer: Medicare Other | Admitting: Physician Assistant

## 2014-12-24 DIAGNOSIS — J441 Chronic obstructive pulmonary disease with (acute) exacerbation: Secondary | ICD-10-CM | POA: Diagnosis not present

## 2014-12-27 DIAGNOSIS — J328 Other chronic sinusitis: Secondary | ICD-10-CM | POA: Diagnosis not present

## 2015-01-02 ENCOUNTER — Encounter: Payer: Self-pay | Admitting: Internal Medicine

## 2015-01-02 ENCOUNTER — Ambulatory Visit (INDEPENDENT_AMBULATORY_CARE_PROVIDER_SITE_OTHER): Payer: Medicare Other | Admitting: Internal Medicine

## 2015-01-02 VITALS — BP 118/70 | HR 78 | Ht 63.0 in | Wt 150.0 lb

## 2015-01-02 DIAGNOSIS — F17210 Nicotine dependence, cigarettes, uncomplicated: Secondary | ICD-10-CM

## 2015-01-02 DIAGNOSIS — Z720 Tobacco use: Secondary | ICD-10-CM | POA: Diagnosis not present

## 2015-01-02 DIAGNOSIS — J449 Chronic obstructive pulmonary disease, unspecified: Secondary | ICD-10-CM

## 2015-01-02 MED ORDER — FLUTTER DEVI: Status: DC

## 2015-01-02 NOTE — Patient Instructions (Addendum)
Follow up with Dr. Leanna Sato: 2 months - please cut back on smoking - cont with levaquin and steroid taper as prescribed by ENT - Nicotine 20mg  patches (1 month supply) - 1 patch daily, alternate sides each day, may use low dose benadryl for itching and mild rash around patch site.  If you are doing well with the patch, call us back in 1 month, and we can lower patch dose to 15mg  daily.  If you experience nausea\vomiting\headaches while on the patch, then please inform us so we can lower the patch dose.  - flutter valve - 5 times daily - avoid all forms of tobacco or smoking - this includes ecigs, vapors, and 2nd hand smoke exposure.  - Please PT your PMD about a GI referral, year been on prednisone on and off for the past 4 months, gastritis/esophagitis can cause recurrent cough and chest discomfort, we are recommending a full GI evaluation also reviewed

## 2015-01-02 NOTE — Assessment & Plan Note (Signed)
Tobacco Cessation - Counseling regarding benefits of smoking cessation strategies was provided for more than 12 min. - Educated that at this time smoking- cessation represents the single most important step that patient can take to enhance the length and quality of live. - Educated patient regarding alternatives of behavior interventions, pharmacotherapy including NRT and non-nicotine therapy such, and combinations of both. - Patient at this time: is still smoking about 1/2 per day and is willing to quit, she will try 20mg  nicotine patches - Have advised patient that continued use of tobacco make her clinical status worsened and make her prone to recurrent sinus and upper respiratory tract infections.

## 2015-01-02 NOTE — Assessment & Plan Note (Addendum)
--  Severe COPD, continue bronchodilators.  --Continue with Symbicort/Spiriva/when necessary albuterol, currently on scheduled albuterol Place on ENT since having sinus surgery -Continue with antibiotic and prednisone taper, patient has follow-up with ENT for recurrent sinus drainage -Recommending for GI evaluation for gastritis/stuffy fasciitis/solid aspiration that could be causing recurrent cough. -Follow-up in 2 months -Patient educated extensively on tobacco cessation, is willing to try nicotine patch-prescription for nicotine patch 20 mg given, patient educated on use of patch and side effects. If patient develops nausea/vomiting/headaches while on patch, we can try to slow her dose, which is 50 mg daily. May use low-dose Benadryl if itching or rash develops around patch site. -Patient had recent sinus surgery for nasal deviation and chronic sinusitis

## 2015-01-02 NOTE — Progress Notes (Signed)
[name]      MRN# [id number] Andrea [name] April 02, 1965   CC: Chief Complaint  Patient presents with  . Follow-up    COPD: pt. states breathing is better. denies SOB, wheezing, chest pain/tightness. prod. cough clear to green in color x2-3wk.       Brief History: Synopsis: 49 year old female, history of COPD, recurrent COPD exacerbations, tobacco abuse. Recent sinus surgery on 10/16, follows with pulmonary for recurrent COPD exacerbations requiring multiple doses of prednisone.   Events since last clinic visit: Patient presents today for follow-up visit, also complaining of recurrent sinus congestion. Since her last visit she has had a sinus surgery, and has been seen in the emergency department for atypical chest pain. Per chart review, her atypical chest pain is most likely noncardiac related, and is mimicking symptoms of esophagitis, versus gastritis. Cardiology has seen and evaluated her and recommended a full GI evaluation. Today patient states this usually saw her ENT physician, she was placed on Levaquin for 10 days and a prednisone taper for 10 days along with scheduled albuterol 3 times a day for 10 days. Today she endorses a cough, feels as if she does have sputum but unable to cough it up, states that overall she is improving with her cough and sinus congestion since being placed on antibiotics and Levaquin. She still smoking about half pack a day, but is willing to quit. -She is accompanied by her daughter today    Recent procedures: Pre-Op Dx: Deviated Nasal Septum, Hypertrophic Inferior Turbinates, Nasal Obstruction, Chronic maxillary and sphenoid sinusitis  Post-op Dx: Same  Proc: 1) Bilateral Maxillary Antrostomy 2) Nasal Septoplasty 3) Bilateral Partial Reduction Inferior Turbinates  4) Left sphenoidotomy 5) Image Guidance Sinus Surgery      Medication:   Current  Outpatient Rx  Name  Route  Sig  Dispense  Refill  . albuterol (PROVENTIL HFA;VENTOLIN HFA) 108 (90 BASE) MCG/ACT inhaler   Inhalation   Inhale 2 puffs into the lungs every 6 (six) hours as needed for wheezing or shortness of breath.   1 Inhaler   3   . albuterol (PROVENTIL) (2.5 MG/3ML) 0.083% nebulizer solution   Nebulization   Take 2.5 mg by nebulization every 4 (four) hours as needed for wheezing or shortness of breath.         . ALPRAZolam (XANAX) 1 MG tablet      TAKE 1 TABLET BY MOUTH EVERY 8 HOURS AS NEEDED   60 tablet   1   . ARIPiprazole (ABILIFY) 5 MG tablet   Oral   Take 5 mg by mouth daily.         . budesonide-formoterol (SYMBICORT) 160-4.5 MCG/ACT inhaler   Inhalation   Inhale 1 puff into the lungs. Up to 3 times a day         . DULoxetine (CYMBALTA) 60 MG capsule   Oral   Take 60 mg by mouth daily.         . hydroxychloroquine (PLAQUENIL) 200 MG tablet      TAKE 2 TABLETS BY MOUTH DAILY   60 tablet   5   . lansoprazole (PREVACID) 30 MG capsule   Oral   Take 30 mg by mouth 2 (two) times daily before a meal.         . nitroGLYCERIN (NITROSTAT) 0.4 MG SL tablet   Sublingual   Place 1 tablet (0.4 mg total) under the tongue every 5 (five) minutes as needed for chest pain.  25 tablet   0   . predniSONE (DELTASONE) 20 MG tablet   Oral   Take 3 tablets (60 mg total) by mouth daily.   12 tablet   0   . propranolol (INDERAL) 20 MG tablet   Oral   Take 20 mg by mouth 2 (two) times daily.         Marland Kitchen Respiratory Therapy Supplies (FLUTTER) DEVI   Does not apply   1 Device by Does not apply route daily.   1 each   0   . tiotropium (SPIRIVA) 18 MCG inhalation capsule   Inhalation   Place 18 mcg into inhaler and inhale daily.         . TraZODone HCl 150 MG TB24   Oral   Take 150 mg by mouth at bedtime as needed.         Marland Kitchen levofloxacin (LEVAQUIN) 500 MG tablet      TAKE 1 TABLET BY MOUTH EVERY DAY FOR 1O DAYS      0       Review of Systems  Constitutional: Negative for fever, chills, weight loss and malaise/fatigue.  HENT: Positive for congestion and sore throat. Negative for ear pain, hearing loss and tinnitus.   Eyes: Negative for blurred vision.  Respiratory: Positive for cough. Negative for hemoptysis, sputum production, shortness of breath and wheezing.   Cardiovascular: Negative for chest pain, palpitations, leg swelling and PND.  Gastrointestinal: Negative for heartburn and nausea.  Musculoskeletal: Negative for myalgias.  Skin: Negative for itching and rash.  Neurological: Negative for dizziness and headaches.  Endo/Heme/Allergies: Does not bruise/bleed easily.      Allergies:  Alum & mag hydroxide-simeth; Cefdinir; Doxycycline; and Ivp dye  Physical Examination:  VS: BP 118/70 mmHg  Pulse 78  Ht 5\' 3"  (1.6 m)  Wt 150 lb (68.04 kg)  BMI 26.58 kg/m2  SpO2 99%  General Appearance: No distress  HEENT: PERRLA, no ptosis, no other lesions noticed Pulmonary:normal breath sounds., diaphragmatic excursion normal.No wheezing, No rales   Cardiovascular:  Normal S1,S2.  No m/r/g.     Abdomen:Exam: Benign, Soft, non-tender, No masses  Skin:   warm, no rashes, no ecchymosis  Extremities: normal, no cyanosis, clubbing, warm with normal capillary refill.      Rad results: (The following images and results were reviewed by Dr. Stevenson Clinch). CXR 10/19 FINDINGS: No active infiltrate or effusion is seen. Mediastinal and hilar contours are unremarkable. The heart is within normal limits in size. No bony abnormality is seen.  IMPRESSION: No active cardiopulmonary disease.    Assessment and Plan: COPD, GOLD B --Severe COPD, continue bronchodilators.  --Continue with Symbicort/Spiriva/when necessary albuterol, currently on scheduled albuterol Place on ENT since having sinus surgery -Continue with antibiotic and prednisone taper, patient has follow-up with ENT for recurrent sinus  drainage -Recommending for GI evaluation for gastritis/stuffy fasciitis/solid aspiration that could be causing recurrent cough. -Follow-up in 2 months -Patient educated extensively on tobacco cessation, is willing to try nicotine patch-prescription for nicotine patch 20 mg given, patient educated on use of patch and side effects. If patient develops nausea/vomiting/headaches while on patch, we can try to slow her dose, which is 50 mg daily. May use low-dose Benadryl if itching or rash develops around patch site. -Patient had recent sinus surgery for nasal deviation and chronic sinusitis     Tobacco abuse Tobacco Cessation - Counseling regarding benefits of smoking cessation strategies was provided for more than 12 min. - Educated that at this time  smoking- cessation represents the single most important step that patient can take to enhance the length and quality of live. - Educated patient regarding alternatives of behavior interventions, pharmacotherapy including NRT and non-nicotine therapy such, and combinations of both. - Patient at this time: is still smoking about 1/2 per day and is willing to quit, she will try 20mg  nicotine patches - Have advised patient that continued use of tobacco make her clinical status worsened and make her prone to recurrent sinus and upper respiratory tract infections.      Updated Medication List Outpatient Encounter Prescriptions as of 01/02/2015  Medication Sig  . albuterol (PROVENTIL HFA;VENTOLIN HFA) 108 (90 BASE) MCG/ACT inhaler Inhale 2 puffs into the lungs every 6 (six) hours as needed for wheezing or shortness of breath.  Marland Kitchen albuterol (PROVENTIL) (2.5 MG/3ML) 0.083% nebulizer solution Take 2.5 mg by nebulization every 4 (four) hours as needed for wheezing or shortness of breath.  . ALPRAZolam (XANAX) 1 MG tablet TAKE 1 TABLET BY MOUTH EVERY 8 HOURS AS NEEDED  . ARIPiprazole (ABILIFY) 5 MG tablet Take 5 mg by mouth daily.  . budesonide-formoterol  (SYMBICORT) 160-4.5 MCG/ACT inhaler Inhale 1 puff into the lungs. Up to 3 times a day  . DULoxetine (CYMBALTA) 60 MG capsule Take 60 mg by mouth daily.  . hydroxychloroquine (PLAQUENIL) 200 MG tablet TAKE 2 TABLETS BY MOUTH DAILY  . lansoprazole (PREVACID) 30 MG capsule Take 30 mg by mouth 2 (two) times daily before a meal.  . nitroGLYCERIN (NITROSTAT) 0.4 MG SL tablet Place 1 tablet (0.4 mg total) under the tongue every 5 (five) minutes as needed for chest pain.  . predniSONE (DELTASONE) 20 MG tablet Take 3 tablets (60 mg total) by mouth daily.  . propranolol (INDERAL) 20 MG tablet Take 20 mg by mouth 2 (two) times daily.  Marland Kitchen Respiratory Therapy Supplies (FLUTTER) DEVI 1 Device by Does not apply route daily.  Marland Kitchen tiotropium (SPIRIVA) 18 MCG inhalation capsule Place 18 mcg into inhaler and inhale daily.  . TraZODone HCl 150 MG TB24 Take 150 mg by mouth at bedtime as needed.  Marland Kitchen levofloxacin (LEVAQUIN) 500 MG tablet TAKE 1 TABLET BY MOUTH EVERY DAY FOR 1O DAYS   No facility-administered encounter medications on file as of 01/02/2015.    Orders for this visit: No orders of the defined types were placed in this encounter.    Thank  you for the visitation and for allowing  [facility] to assist in the care of your patient. Our recommendations are noted above.  Please contact us if we can be of further service.  Vilinda Boehringer, MD [facility] Office Number: [phone]  Note: This note was prepared with Dragon dictation along with smaller phrase technology. Any transcriptional errors that result from this process are unintentional.

## 2015-01-10 ENCOUNTER — Telehealth: Payer: Self-pay

## 2015-01-10 ENCOUNTER — Other Ambulatory Visit
Admission: RE | Admit: 2015-01-10 | Discharge: 2015-01-10 | Disposition: A | Discharge status: Home / Self Care | Payer: Medicare Other | Source: Ambulatory Visit | Attending: Internal Medicine | Admitting: Internal Medicine

## 2015-01-10 DIAGNOSIS — J189 Pneumonia, unspecified organism: Secondary | ICD-10-CM | POA: Insufficient documentation

## 2015-01-10 DIAGNOSIS — J328 Other chronic sinusitis: Secondary | ICD-10-CM | POA: Diagnosis not present

## 2015-01-10 LAB — EXPECTORATED SPUTUM ASSESSMENT W REFEX TO RESP CULTURE

## 2015-01-10 LAB — EXPECTORATED SPUTUM ASSESSMENT W GRAM STAIN, RFLX TO RESP C

## 2015-01-10 NOTE — Telephone Encounter (Signed)
Needs order for sputum. States it may be faster to fax. [phone]

## 2015-01-10 NOTE — Telephone Encounter (Signed)
Order placed and faxed

## 2015-01-12 LAB — CULTURE, RESPIRATORY W GRAM STAIN: Culture: NORMAL

## 2015-01-12 LAB — CULTURE, RESPIRATORY

## 2015-01-19 ENCOUNTER — Ambulatory Visit: Payer: Medicare Other | Admitting: Cardiovascular Disease

## 2015-01-23 DIAGNOSIS — J019 Acute sinusitis, unspecified: Secondary | ICD-10-CM | POA: Diagnosis not present

## 2015-02-02 ENCOUNTER — Other Ambulatory Visit: Payer: Self-pay | Admitting: Family Medicine

## 2015-02-02 NOTE — Telephone Encounter (Signed)
Please call in clonazepam and alprazolam.

## 2015-02-03 NOTE — Telephone Encounter (Signed)
Rx called in to pharmacy. 

## 2015-02-09 ENCOUNTER — Other Ambulatory Visit: Payer: Self-pay | Admitting: *Deleted

## 2015-02-13 DIAGNOSIS — J320 Chronic maxillary sinusitis: Secondary | ICD-10-CM | POA: Diagnosis not present

## 2015-02-13 DIAGNOSIS — J324 Chronic pansinusitis: Secondary | ICD-10-CM | POA: Diagnosis not present

## 2015-02-16 ENCOUNTER — Ambulatory Visit: Payer: Medicare Other | Admitting: Internal Medicine

## 2015-03-08 ENCOUNTER — Other Ambulatory Visit: Payer: Self-pay | Admitting: *Deleted

## 2015-03-08 MED ORDER — PROPRANOLOL HCL 20 MG PO TABS: 20.0000 mg | ORAL_TABLET | Freq: Two times a day (BID) | ORAL | Status: DC

## 2015-03-08 NOTE — Telephone Encounter (Signed)
Requesting 90 day supply. Last ov (for acute visit) 10/25/2014.

## 2015-03-18 ENCOUNTER — Observation Stay
Admission: EM | Admit: 2015-03-18 | Discharge: 2015-03-19 | Disposition: A | Discharge status: Home / Self Care | Payer: Medicare Other | Attending: Internal Medicine | Admitting: Internal Medicine

## 2015-03-18 ENCOUNTER — Encounter: Payer: Self-pay | Admitting: Emergency Medicine

## 2015-03-18 ENCOUNTER — Other Ambulatory Visit: Payer: Self-pay

## 2015-03-18 ENCOUNTER — Emergency Department: Payer: Medicare Other

## 2015-03-18 DIAGNOSIS — M329 Systemic lupus erythematosus, unspecified: Secondary | ICD-10-CM | POA: Diagnosis not present

## 2015-03-18 DIAGNOSIS — T447X5A Adverse effect of beta-adrenoreceptor antagonists, initial encounter: Secondary | ICD-10-CM | POA: Diagnosis not present

## 2015-03-18 DIAGNOSIS — Z7951 Long term (current) use of inhaled steroids: Secondary | ICD-10-CM | POA: Insufficient documentation

## 2015-03-18 DIAGNOSIS — Z881 Allergy status to other antibiotic agents status: Secondary | ICD-10-CM | POA: Insufficient documentation

## 2015-03-18 DIAGNOSIS — Z8261 Family history of arthritis: Secondary | ICD-10-CM | POA: Insufficient documentation

## 2015-03-18 DIAGNOSIS — Z800 Family history of malignant neoplasm of digestive organs: Secondary | ICD-10-CM | POA: Diagnosis not present

## 2015-03-18 DIAGNOSIS — F419 Anxiety disorder, unspecified: Secondary | ICD-10-CM | POA: Insufficient documentation

## 2015-03-18 DIAGNOSIS — Z79899 Other long term (current) drug therapy: Secondary | ICD-10-CM | POA: Insufficient documentation

## 2015-03-18 DIAGNOSIS — R0789 Other chest pain: Secondary | ICD-10-CM | POA: Diagnosis not present

## 2015-03-18 DIAGNOSIS — L301 Dyshidrosis [pompholyx]: Secondary | ICD-10-CM | POA: Diagnosis not present

## 2015-03-18 DIAGNOSIS — F410 Panic disorder [episodic paroxysmal anxiety] without agoraphobia: Secondary | ICD-10-CM | POA: Diagnosis not present

## 2015-03-18 DIAGNOSIS — I952 Hypotension due to drugs: Secondary | ICD-10-CM | POA: Insufficient documentation

## 2015-03-18 DIAGNOSIS — Z888 Allergy status to other drugs, medicaments and biological substances status: Secondary | ICD-10-CM | POA: Diagnosis not present

## 2015-03-18 DIAGNOSIS — R0600 Dyspnea, unspecified: Secondary | ICD-10-CM

## 2015-03-18 DIAGNOSIS — Z91041 Radiographic dye allergy status: Secondary | ICD-10-CM | POA: Insufficient documentation

## 2015-03-18 DIAGNOSIS — Z7982 Long term (current) use of aspirin: Secondary | ICD-10-CM | POA: Insufficient documentation

## 2015-03-18 DIAGNOSIS — I2510 Atherosclerotic heart disease of native coronary artery without angina pectoris: Secondary | ICD-10-CM | POA: Insufficient documentation

## 2015-03-18 DIAGNOSIS — R42 Dizziness and giddiness: Secondary | ICD-10-CM | POA: Diagnosis not present

## 2015-03-18 DIAGNOSIS — M797 Fibromyalgia: Secondary | ICD-10-CM | POA: Insufficient documentation

## 2015-03-18 DIAGNOSIS — I493 Ventricular premature depolarization: Secondary | ICD-10-CM | POA: Diagnosis not present

## 2015-03-18 DIAGNOSIS — R0602 Shortness of breath: Secondary | ICD-10-CM | POA: Diagnosis not present

## 2015-03-18 DIAGNOSIS — R002 Palpitations: Secondary | ICD-10-CM | POA: Diagnosis not present

## 2015-03-18 DIAGNOSIS — Z8249 Family history of ischemic heart disease and other diseases of the circulatory system: Secondary | ICD-10-CM | POA: Diagnosis not present

## 2015-03-18 DIAGNOSIS — F329 Major depressive disorder, single episode, unspecified: Secondary | ICD-10-CM | POA: Insufficient documentation

## 2015-03-18 DIAGNOSIS — R05 Cough: Secondary | ICD-10-CM | POA: Diagnosis not present

## 2015-03-18 DIAGNOSIS — E785 Hyperlipidemia, unspecified: Secondary | ICD-10-CM | POA: Diagnosis not present

## 2015-03-18 DIAGNOSIS — F17210 Nicotine dependence, cigarettes, uncomplicated: Secondary | ICD-10-CM | POA: Diagnosis not present

## 2015-03-18 DIAGNOSIS — R918 Other nonspecific abnormal finding of lung field: Secondary | ICD-10-CM | POA: Insufficient documentation

## 2015-03-18 DIAGNOSIS — Z716 Tobacco abuse counseling: Secondary | ICD-10-CM

## 2015-03-18 DIAGNOSIS — Z825 Family history of asthma and other chronic lower respiratory diseases: Secondary | ICD-10-CM | POA: Insufficient documentation

## 2015-03-18 DIAGNOSIS — M19049 Primary osteoarthritis, unspecified hand: Secondary | ICD-10-CM | POA: Insufficient documentation

## 2015-03-18 DIAGNOSIS — G4700 Insomnia, unspecified: Secondary | ICD-10-CM | POA: Diagnosis not present

## 2015-03-18 DIAGNOSIS — J441 Chronic obstructive pulmonary disease with (acute) exacerbation: Secondary | ICD-10-CM | POA: Diagnosis not present

## 2015-03-18 DIAGNOSIS — I499 Cardiac arrhythmia, unspecified: Secondary | ICD-10-CM | POA: Diagnosis not present

## 2015-03-18 DIAGNOSIS — K219 Gastro-esophageal reflux disease without esophagitis: Secondary | ICD-10-CM | POA: Diagnosis not present

## 2015-03-18 DIAGNOSIS — I10 Essential (primary) hypertension: Secondary | ICD-10-CM | POA: Insufficient documentation

## 2015-03-18 DIAGNOSIS — I959 Hypotension, unspecified: Secondary | ICD-10-CM

## 2015-03-18 DIAGNOSIS — R079 Chest pain, unspecified: Principal | ICD-10-CM | POA: Diagnosis present

## 2015-03-18 LAB — COMPREHENSIVE METABOLIC PANEL
ALK PHOS: 88 U/L (ref 38–126)
ALT: 17 U/L (ref 14–54)
AST: 17 U/L (ref 15–41)
Albumin: 3.7 g/dL (ref 3.5–5.0)
Anion gap: 6 (ref 5–15)
BILIRUBIN TOTAL: 0.3 mg/dL (ref 0.3–1.2)
BUN: 16 mg/dL (ref 6–20)
CALCIUM: 9.2 mg/dL (ref 8.9–10.3)
CO2: 27 mmol/L (ref 22–32)
CREATININE: 0.66 mg/dL (ref 0.44–1.00)
Chloride: 106 mmol/L (ref 101–111)
Glucose, Bld: 99 mg/dL (ref 65–99)
Potassium: 3.8 mmol/L (ref 3.5–5.1)
Sodium: 139 mmol/L (ref 135–145)
Total Protein: 7.2 g/dL (ref 6.5–8.1)

## 2015-03-18 LAB — CBC WITH DIFFERENTIAL/PLATELET
BASOS ABS: 0.1 10*3/uL (ref 0–0.1)
Basophils Relative: 1 %
EOS ABS: 0.1 10*3/uL (ref 0–0.7)
EOS PCT: 2 %
HCT: 42.5 % (ref 35.0–47.0)
Hemoglobin: 14.2 g/dL (ref 12.0–16.0)
LYMPHS ABS: 3.6 10*3/uL (ref 1.0–3.6)
Lymphocytes Relative: 42 %
MCH: 27.1 pg (ref 26.0–34.0)
MCHC: 33.3 g/dL (ref 32.0–36.0)
MCV: 81.4 fL (ref 80.0–100.0)
MONO ABS: 0.8 10*3/uL (ref 0.2–0.9)
Monocytes Relative: 9 %
Neutro Abs: 4 10*3/uL (ref 1.4–6.5)
Neutrophils Relative %: 46 %
PLATELETS: 223 10*3/uL (ref 150–440)
RBC: 5.22 MIL/uL — AB (ref 3.80–5.20)
RDW: 14.5 % (ref 11.5–14.5)
WBC: 8.7 10*3/uL (ref 3.6–11.0)

## 2015-03-18 LAB — TROPONIN I

## 2015-03-18 LAB — FIBRIN DERIVATIVES D-DIMER (ARMC ONLY): FIBRIN DERIVATIVES D-DIMER (ARMC): 446 (ref 0–499)

## 2015-03-18 NOTE — ED Notes (Signed)
Pt. States intermittent SOB for the past 3 days.  Pt. States feeling dizzy and lightheaded.  Pt. Denies N/V.

## 2015-03-18 NOTE — ED Notes (Signed)
Pt states for approx one week has had intermittent shob "spells". Pt denies shob currently. Pt currently able to speak in full sentences. Pt states "i feel lightheaded with them when they come on." pt denies fever, states has had a cough. Pt with history of COPD.

## 2015-03-18 NOTE — ED Provider Notes (Signed)
[facility] Emergency Department Provider Note  ____________________________________________  Time seen: Approximately 10:56 PM  I have reviewed the triage vital signs and the nursing notes.   HISTORY  Chief Complaint Shortness of Breath    HPI Andrea [name] is a 50 y.o. female who has history of COPD. She complains of shortness of breath coming and going for the last 3 days. Last 45 minutes or less. Occasionally goes on with a rapid heartbeat but not always. He is more short of breath with exercise. Even going to x-ray and doing movements and x-ray made her short of breath. She also gets chest tightness with it and dizziness like she's going to pass out when she gets the chest tightness. The dizziness is new. She has never had that before. She has a history of smoking.  Past Medical History  Diagnosis Date  . PONV (postoperative nausea and vomiting)   . Arthritis     fingers  . COPD (chronic obstructive pulmonary disease) ([place])   . Emphysema/COPD ([place])   . GERD (gastroesophageal reflux disease)   . Anxiety     panic attacks, chest pain  . Hypertension   . Arrhythmia   . Wears dentures     partial upper and lower  . Lupus ([place])   . Anxiety     Patient Active Problem List   Diagnosis Date Noted  . COPD exacerbation ([place]) 10/04/2014  . Anxiety 09/22/2014  . Colon polyp 09/22/2014  . Depression 09/22/2014  . Dyshidrosis 09/22/2014  . GERD (gastroesophageal reflux disease) 09/22/2014  . Insomnia 09/22/2014  . Lung nodule, multiple 09/22/2014  . Panic disorder 09/22/2014  . Chest pain 01/17/2014  . Palpitations 01/17/2014  . Pulmonary nodule 06/10/2013  . Shortness of breath 05/20/2013  . COPD, GOLD B 05/20/2013  . Tobacco abuse 05/20/2013  . Daytime somnolence 05/20/2013  . Discoid lupus 05/30/2012  . Fibromyalgia 02/11/2012  . PVC's (premature ventricular contractions) 01/29/2011  . Essential (primary) hypertension 03/04/1998    Past  Surgical History  Procedure Laterality Date  . Vaginal hysterectomy  1995    vaginal  . Cardiac catheterization  01/03/14  . Image guided sinus surgery N/A 11/30/2014    Procedure: IMAGE GUIDED SINUS SURGERY;  Surgeon: Carloyn Manner, MD;  Location: [place];  Service: ENT;  Laterality: N/A;  GAVE DISK TO CE CE  . Septoplasty N/A 11/30/2014    Procedure: SEPTOPLASTY;  Surgeon: Carloyn Manner, MD;  Location: [place];  Service: ENT;  Laterality: N/A;  . Maxillary antrostomy Bilateral 11/30/2014    Procedure: MAXILLARY ANTROSTOMY;  Surgeon: Carloyn Manner, MD;  Location: [place];  Service: ENT;  Laterality: Bilateral;  . Sphenoidectomy Left 11/30/2014    Procedure: Coralee Pesa, ;  Surgeon: Carloyn Manner, MD;  Location: [place];  Service: ENT;  Laterality: Left;  . Cardiac catheterization      Current Outpatient Rx  Name  Route  Sig  Dispense  Refill  . albuterol (PROVENTIL HFA;VENTOLIN HFA) 108 (90 BASE) MCG/ACT inhaler   Inhalation   Inhale 2 puffs into the lungs every 6 (six) hours as needed for wheezing or shortness of breath.   1 Inhaler   3   . albuterol (PROVENTIL) (2.5 MG/3ML) 0.083% nebulizer solution   Nebulization   Take 2.5 mg by nebulization every 4 (four) hours as needed for wheezing or shortness of breath.         . ALPRAZolam (XANAX) 1 MG tablet  TAKE 1 TABLET BY MOUTH EVERY 8 HOURS AS NEEDED   60 tablet   1     Not to exceed 4 additional fills before 05/14/2015   . ARIPiprazole (ABILIFY) 5 MG tablet   Oral   Take 5 mg by mouth daily.         . budesonide-formoterol (SYMBICORT) 160-4.5 MCG/ACT inhaler   Inhalation   Inhale 1 puff into the lungs. Up to 3 times a day         . clonazePAM (KLONOPIN) 1 MG tablet      TAKE 1 TABLET BY MOUTH TWICE A DAY AS NEEDED   90 tablet   1     This request is for a new prescription for a contr ...   . DULoxetine (CYMBALTA) 60 MG capsule   Oral   Take 60  mg by mouth daily.         . hydroxychloroquine (PLAQUENIL) 200 MG tablet      TAKE 2 TABLETS BY MOUTH DAILY   60 tablet   5   . lansoprazole (PREVACID) 30 MG capsule   Oral   Take 30 mg by mouth 2 (two) times daily before a meal.         . levofloxacin (LEVAQUIN) 500 MG tablet      TAKE 1 TABLET BY MOUTH EVERY DAY FOR 1O DAYS      0   . nitroGLYCERIN (NITROSTAT) 0.4 MG SL tablet   Sublingual   Place 1 tablet (0.4 mg total) under the tongue every 5 (five) minutes as needed for chest pain.   25 tablet   0   . predniSONE (DELTASONE) 20 MG tablet   Oral   Take 3 tablets (60 mg total) by mouth daily.   12 tablet   0   . propranolol (INDERAL) 20 MG tablet   Oral   Take 1 tablet (20 mg total) by mouth 2 (two) times daily.   180 tablet   4   . Respiratory Therapy Supplies (FLUTTER) DEVI   Does not apply   1 Device by Does not apply route daily.   1 each   0   . Respiratory Therapy Supplies (FLUTTER) DEVI      Use as directed.   1 each   0   . tiotropium (SPIRIVA) 18 MCG inhalation capsule   Inhalation   Place 18 mcg into inhaler and inhale daily.         . TraZODone HCl 150 MG TB24   Oral   Take 150 mg by mouth at bedtime as needed.           Allergies Alum & mag hydroxide-simeth; Cefdinir; Doxycycline; and Ivp dye  Family History  Problem Relation Age of Onset  . Emphysema Father   . Asthma Father   . Heart disease Father   . Cancer Father     colon  . Heart attack Father   . Arthritis Father     RA  . Hypertension Mother     Social History Social History  Substance Use Topics  . Smoking status: Current Every Day Smoker -- 0.00 packs/day for 16 years    Types: Cigarettes  . Smokeless tobacco: Never Used     Comment: occasional cigarette since quitting last week  . Alcohol Use: No    Review of Systems Constitutional: No fever/chills Eyes: No visual changes. ENT: No sore throat. Cardiovascular: See history of present  illness Respiratory: He history of present  illness. Gastrointestinal: No abdominal pain.  No nausea, no vomiting.  No diarrhea.  No constipation. Genitourinary: Negative for dysuria. Musculoskeletal: Negative for back pain. Skin: Negative for rash. Neurological: Negative for headaches, focal weakness or numbness.  10-point ROS otherwise negative.  ____________________________________________   PHYSICAL EXAM:  VITAL SIGNS: ED Triage Vitals  Enc Vitals Group     BP 03/18/15 2101 124/81 mmHg     Pulse Rate 03/18/15 2101 78     Resp 03/18/15 2101 16     Temp 03/18/15 2101 97.8 F (36.6 C)     Temp Source 03/18/15 2101 Oral     SpO2 03/18/15 2101 97 %     Weight 03/18/15 2101 151 lb (68.493 kg)     Height 03/18/15 2101 5\' 3"  (1.6 m)     Head Cir --      Peak Flow --      Pain Score --      Pain Loc --      Pain Edu? --      Excl. in [place]? --     Constitutional: Alert and oriented. Well appearing and in no acute distress. Eyes: Conjunctivae are normal. PERRL. EOMI. Head: Atraumatic. Nose: No congestion/rhinnorhea. Mouth/Throat: Mucous membranes are moist.  Oropharynx non-erythematous. Neck: No stridor Cardiovascular: Normal rate, regular rhythm. Grossly normal heart sounds.  Good peripheral circulation. Respiratory: Normal respiratory effort.  No retractions. Lungs CTAB. Gastrointestinal: Soft and nontender. No distention. No abdominal bruits. No CVA tenderness. Musculoskeletal: No lower extremity tenderness nor edema.  No joint effusions. Neurologic:  Normal speech and language. No gross focal neurologic deficits are appreciated. No gait instability. Skin:  Skin is warm, dry and intact. No rash noted. Psychiatric: Mood and affect are normal. Speech and behavior are normal.  ____________________________________________   LABS (all labs ordered are listed, but only abnormal results are displayed)  Labs Reviewed  CBC WITH DIFFERENTIAL/PLATELET - Abnormal; Notable for the  following:    RBC 5.22 (*)    All other components within normal limits  COMPREHENSIVE METABOLIC PANEL  TROPONIN I  FIBRIN DERIVATIVES D-DIMER ([facility] ONLY)   ____________________________________________  EKG  EKG read and interpreted by me shows normal sinus rhythm rate of 75 normal axis nonspecific ST-T wave changes EKG is very similar to one done on 12/02/2014 ____________________________________________  RADIOLOGY  X-ray read by radiology and reviewed by me shows no acute disease ____________________________________________   PROCEDURES   ____________________________________________   INITIAL IMPRESSION / ASSESSMENT AND PLAN / ED COURSE  Pertinent labs & imaging results that were available during my care of the patient were reviewed by me and considered in my medical decision making (see chart for details).  Patient seems to be getting more and more dyspneic with less and less exertion. I am worried that this may represent an anginal equivalent in this smoker who also has a history of lupus. In addition she does not have a normal EKG. I will asked to observe her for possible crescendo angina ____________________________________________   FINAL CLINICAL IMPRESSION(S) / ED DIAGNOSES  Final diagnoses:  Dyspnea      Nena Polio, MD 03/19/15 0030

## 2015-03-19 DIAGNOSIS — Z720 Tobacco use: Secondary | ICD-10-CM | POA: Diagnosis not present

## 2015-03-19 DIAGNOSIS — R079 Chest pain, unspecified: Secondary | ICD-10-CM | POA: Diagnosis not present

## 2015-03-19 DIAGNOSIS — R072 Precordial pain: Secondary | ICD-10-CM

## 2015-03-19 DIAGNOSIS — Z716 Tobacco abuse counseling: Secondary | ICD-10-CM

## 2015-03-19 DIAGNOSIS — E785 Hyperlipidemia, unspecified: Secondary | ICD-10-CM

## 2015-03-19 DIAGNOSIS — R0600 Dyspnea, unspecified: Secondary | ICD-10-CM

## 2015-03-19 DIAGNOSIS — J449 Chronic obstructive pulmonary disease, unspecified: Secondary | ICD-10-CM | POA: Diagnosis not present

## 2015-03-19 DIAGNOSIS — F17200 Nicotine dependence, unspecified, uncomplicated: Secondary | ICD-10-CM | POA: Diagnosis not present

## 2015-03-19 DIAGNOSIS — I959 Hypotension, unspecified: Secondary | ICD-10-CM

## 2015-03-19 DIAGNOSIS — M329 Systemic lupus erythematosus, unspecified: Secondary | ICD-10-CM | POA: Diagnosis not present

## 2015-03-19 LAB — LIPID PANEL
Cholesterol: 193 mg/dL (ref 0–200)
HDL: 34 mg/dL — ABNORMAL LOW (ref >40)
LDL Cholesterol: 139 mg/dL — ABNORMAL HIGH (ref 0–99)
Total CHOL/HDL Ratio: 5.7 RATIO
Triglycerides: 102 mg/dL (ref <150)
VLDL: 20 mg/dL (ref 0–40)

## 2015-03-19 LAB — TROPONIN I
Troponin I: <0.03 ng/mL (ref <0.031)
Troponin I: <0.03 ng/mL (ref <0.031)

## 2015-03-19 LAB — HEMOGLOBIN A1C: Hgb A1c MFr Bld: 5.6 % (ref 4.0–6.0)

## 2015-03-19 LAB — TSH: TSH: 3.594 u[IU]/mL (ref 0.350–4.500)

## 2015-03-19 MED ORDER — ONDANSETRON HCL 4 MG PO TABS
4.0000 mg | ORAL_TABLET | Freq: Four times a day (QID) | ORAL | Status: DC | PRN
Start: 2015-03-19 — End: 2015-03-19

## 2015-03-19 MED ORDER — TRAZODONE HCL ER 150 MG PO TB24: 150.0000 mg | ORAL_TABLET | Freq: Every evening | ORAL | Status: DC | PRN

## 2015-03-19 MED ORDER — ALBUTEROL SULFATE (2.5 MG/3ML) 0.083% IN NEBU: 2.5000 mg | INHALATION_SOLUTION | RESPIRATORY_TRACT | Status: DC | PRN

## 2015-03-19 MED ORDER — BUDESONIDE-FORMOTEROL FUMARATE 160-4.5 MCG/ACT IN AERO
1.0000 | INHALATION_SPRAY | Freq: Two times a day (BID) | RESPIRATORY_TRACT | Status: DC
  Administered 2015-03-19: 1 via RESPIRATORY_TRACT
  Filled 2015-03-19 (×2): qty 6

## 2015-03-19 MED ORDER — ENOXAPARIN SODIUM 40 MG/0.4ML ~~LOC~~ SOLN: 40.0000 mg | SUBCUTANEOUS | Status: DC

## 2015-03-19 MED ORDER — SENNA 8.6 MG PO TABS
1.0000 | ORAL_TABLET | Freq: Two times a day (BID) | ORAL | Status: DC
  Administered 2015-03-19: 8.6 mg via ORAL
  Filled 2015-03-19 (×2): qty 1

## 2015-03-19 MED ORDER — IPRATROPIUM-ALBUTEROL 0.5-2.5 (3) MG/3ML IN SOLN
3.0000 mL | RESPIRATORY_TRACT | Status: DC
  Administered 2015-03-19: 3 mL via RESPIRATORY_TRACT
  Filled 2015-03-19: qty 3

## 2015-03-19 MED ORDER — TIOTROPIUM BROMIDE MONOHYDRATE 18 MCG IN CAPS
18.0000 ug | ORAL_CAPSULE | Freq: Every day | RESPIRATORY_TRACT | Status: DC
  Administered 2015-03-19: 18 ug via RESPIRATORY_TRACT
  Filled 2015-03-19 (×2): qty 5

## 2015-03-19 MED ORDER — FLUTTER DEVI: 1.0000 | Freq: Every day | Status: AC

## 2015-03-19 MED ORDER — SODIUM CHLORIDE 0.9 % IJ SOLN
3.0000 mL | Freq: Two times a day (BID) | INTRAMUSCULAR | Status: DC
  Administered 2015-03-19 (×2): 3 mL via INTRAVENOUS

## 2015-03-19 MED ORDER — NICOTINE POLACRILEX 4 MG MT GUM: 4.0000 mg | CHEWING_GUM | OROMUCOSAL | Status: DC | PRN

## 2015-03-19 MED ORDER — AZITHROMYCIN 250 MG PO TABS: ORAL_TABLET | ORAL | Status: DC

## 2015-03-19 MED ORDER — NITROGLYCERIN 0.4 MG SL SUBL: 0.4000 mg | SUBLINGUAL_TABLET | SUBLINGUAL | Status: DC | PRN

## 2015-03-19 MED ORDER — NICOTINE 21 MG/24HR TD PT24
21.0000 mg | MEDICATED_PATCH | Freq: Every day | TRANSDERMAL | Status: DC
  Filled 2015-03-19: qty 1

## 2015-03-19 MED ORDER — ISOSORBIDE MONONITRATE ER 30 MG PO TB24: 30.0000 mg | ORAL_TABLET | Freq: Every day | ORAL | Status: DC

## 2015-03-19 MED ORDER — ARIPIPRAZOLE 2 MG PO TABS
5.0000 mg | ORAL_TABLET | Freq: Every day | ORAL | Status: DC
  Administered 2015-03-19: 5 mg via ORAL
  Filled 2015-03-19: qty 3

## 2015-03-19 MED ORDER — ACETAMINOPHEN 650 MG RE SUPP: 650.0000 mg | Freq: Four times a day (QID) | RECTAL | Status: DC | PRN

## 2015-03-19 MED ORDER — ASPIRIN 81 MG PO TBEC: 81.0000 mg | DELAYED_RELEASE_TABLET | Freq: Every day | ORAL | Status: DC

## 2015-03-19 MED ORDER — HYDROXYCHLOROQUINE SULFATE 200 MG PO TABS
400.0000 mg | ORAL_TABLET | Freq: Every day | ORAL | Status: DC
  Administered 2015-03-19: 400 mg via ORAL
  Filled 2015-03-19 (×2): qty 2

## 2015-03-19 MED ORDER — PREDNISONE 20 MG PO TABS: 60.0000 mg | ORAL_TABLET | Freq: Every day | ORAL | Status: DC

## 2015-03-19 MED ORDER — CLONAZEPAM 1 MG PO TABS
1.0000 mg | ORAL_TABLET | Freq: Two times a day (BID) | ORAL | Status: DC | PRN
  Administered 2015-03-19: 1 mg via ORAL
  Filled 2015-03-19: qty 1

## 2015-03-19 MED ORDER — IPRATROPIUM-ALBUTEROL 0.5-2.5 (3) MG/3ML IN SOLN: 3.0000 mL | RESPIRATORY_TRACT | Status: DC

## 2015-03-19 MED ORDER — TRAZODONE HCL 50 MG PO TABS: 150.0000 mg | ORAL_TABLET | Freq: Every evening | ORAL | Status: DC | PRN

## 2015-03-19 MED ORDER — PROPRANOLOL HCL 10 MG PO TABS
20.0000 mg | ORAL_TABLET | Freq: Two times a day (BID) | ORAL | Status: DC
  Filled 2015-03-19 (×2): qty 2

## 2015-03-19 MED ORDER — ONDANSETRON HCL 4 MG/2ML IJ SOLN: 4.0000 mg | Freq: Four times a day (QID) | INTRAMUSCULAR | Status: DC | PRN

## 2015-03-19 MED ORDER — NICOTINE 21 MG/24HR TD PT24: 21.0000 mg | MEDICATED_PATCH | Freq: Every day | TRANSDERMAL | Status: DC

## 2015-03-19 MED ORDER — ALPRAZOLAM 1 MG PO TABS: 1.0000 mg | ORAL_TABLET | Freq: Three times a day (TID) | ORAL | Status: DC | PRN

## 2015-03-19 MED ORDER — PREDNISONE 10 MG (21) PO TBPK: 10.0000 mg | ORAL_TABLET | Freq: Every day | ORAL | Status: DC

## 2015-03-19 MED ORDER — FLUTTER DEVI: 1.0000 | Freq: Every day | Status: DC

## 2015-03-19 MED ORDER — MORPHINE SULFATE (PF) 2 MG/ML IV SOLN: 2.0000 mg | INTRAVENOUS | Status: DC | PRN

## 2015-03-19 MED ORDER — DULOXETINE HCL 30 MG PO CPEP
60.0000 mg | ORAL_CAPSULE | Freq: Every day | ORAL | Status: DC
  Administered 2015-03-19: 60 mg via ORAL
  Filled 2015-03-19: qty 2

## 2015-03-19 MED ORDER — ALBUTEROL SULFATE HFA 108 (90 BASE) MCG/ACT IN AERS: 2.0000 | INHALATION_SPRAY | Freq: Four times a day (QID) | RESPIRATORY_TRACT | Status: DC | PRN

## 2015-03-19 MED ORDER — ASPIRIN EC 81 MG PO TBEC
81.0000 mg | DELAYED_RELEASE_TABLET | Freq: Every day | ORAL | Status: DC
  Administered 2015-03-19: 81 mg via ORAL
  Filled 2015-03-19: qty 1

## 2015-03-19 MED ORDER — ACETAMINOPHEN 325 MG PO TABS: 650.0000 mg | ORAL_TABLET | Freq: Four times a day (QID) | ORAL | Status: DC | PRN

## 2015-03-19 MED ORDER — METHYLPREDNISOLONE SODIUM SUCC 125 MG IJ SOLR
60.0000 mg | INTRAMUSCULAR | Status: AC
  Administered 2015-03-19: 60 mg via INTRAVENOUS
  Filled 2015-03-19: qty 2

## 2015-03-19 MED ORDER — PANTOPRAZOLE SODIUM 40 MG PO TBEC
40.0000 mg | DELAYED_RELEASE_TABLET | Freq: Every day | ORAL | Status: DC
  Administered 2015-03-19: 40 mg via ORAL
  Filled 2015-03-19: qty 1

## 2015-03-19 NOTE — H&P (Signed)
Andrea [name] is an 50 y.o. female.   Chief Complaint: Shortness of breath HPI: The patient presents emergency department complaining of shortness of breath. She's had a number of acute episodes this week where she was unable to catch her breath and felt as if she may be choking. Chest x-ray in the emergency department showed no acute abnormality. Past history is significant for COPD but the patient denies significant cough as well as sputum production. During evaluation in the emergency department the patient complained of chest pain particularly as she was positioning herself for x-ray. The pain did not radiate but was substernal and accompanied by some nausea. Due to unexplained chest pain and risk factors for coronary artery disease the emergency department physician asked for admission.  Past Medical History  Diagnosis Date  . PONV (postoperative nausea and vomiting)   . Arthritis     fingers  . COPD (chronic obstructive pulmonary disease) ([place])   . Emphysema/COPD ([place])   . GERD (gastroesophageal reflux disease)   . Anxiety     panic attacks, chest pain  . Hypertension   . Arrhythmia   . Wears dentures     partial upper and lower  . Lupus ([place])   . Anxiety     Past Surgical History  Procedure Laterality Date  . Vaginal hysterectomy  1995    vaginal  . Cardiac catheterization  01/03/14  . Image guided sinus surgery N/A 11/30/2014    Procedure: IMAGE GUIDED SINUS SURGERY;  Surgeon: Carloyn Manner, MD;  Location: [place];  Service: ENT;  Laterality: N/A;  GAVE DISK TO CE CE  . Septoplasty N/A 11/30/2014    Procedure: SEPTOPLASTY;  Surgeon: Carloyn Manner, MD;  Location: [place];  Service: ENT;  Laterality: N/A;  . Maxillary antrostomy Bilateral 11/30/2014    Procedure: MAXILLARY ANTROSTOMY;  Surgeon: Carloyn Manner, MD;  Location: [place];  Service: ENT;  Laterality: Bilateral;  . Sphenoidectomy Left 11/30/2014    Procedure: Coralee Pesa, ;   Surgeon: Carloyn Manner, MD;  Location: [place];  Service: ENT;  Laterality: Left;  . Cardiac catheterization      Family History  Problem Relation Age of Onset  . Emphysema Father   . Asthma Father   . Heart disease Father   . Cancer Father     colon  . Heart attack Father   . Arthritis Father     RA  . Hypertension Mother    Social History:  reports that she has been smoking Cigarettes.  She has been smoking about 0.00 packs per day for the past 16 years. She has never used smokeless tobacco. She reports that she does not drink alcohol or use illicit drugs.  Allergies:  Allergies  Allergen Reactions  . Alum & Mag Hydroxide-Simeth Diarrhea and Nausea Only  . Cefdinir Other (See Comments)    tachycardia  . Doxycycline Other (See Comments)    Nausea, migraine  . Ivp Dye [Iodinated Diagnostic Agents] Palpitations    Medications Prior to Admission  Medication Sig Dispense Refill  . albuterol (PROVENTIL HFA;VENTOLIN HFA) 108 (90 BASE) MCG/ACT inhaler Inhale 2 puffs into the lungs every 6 (six) hours as needed for wheezing or shortness of breath. 1 Inhaler 3  . ALPRAZolam (XANAX) 1 MG tablet Take 1 mg by mouth 3 (three) times daily as needed for anxiety.    . clonazePAM (KLONOPIN) 1 MG tablet Take 1 mg by mouth 2 (two) times daily.    . hydroxychloroquine (  PLAQUENIL) 200 MG tablet Take 400 mg by mouth daily.    . nitroGLYCERIN (NITROSTAT) 0.4 MG SL tablet Place 1 tablet (0.4 mg total) under the tongue every 5 (five) minutes as needed for chest pain. 25 tablet 0  . propranolol (INDERAL) 20 MG tablet Take 1 tablet (20 mg total) by mouth 2 (two) times daily. 180 tablet 4  . ARIPiprazole (ABILIFY) 5 MG tablet Take 5 mg by mouth daily. Reported on 03/19/2015    . budesonide-formoterol (SYMBICORT) 160-4.5 MCG/ACT inhaler Inhale 1 puff into the lungs. Up to 3 times a day    . DULoxetine (CYMBALTA) 60 MG capsule Take 60 mg by mouth daily.    . lansoprazole (PREVACID) 30 MG  capsule Take 30 mg by mouth 2 (two) times daily before a meal.    . tiotropium (SPIRIVA) 18 MCG inhalation capsule Place 18 mcg into inhaler and inhale daily.    . TraZODone HCl 150 MG TB24 Take 150 mg by mouth at bedtime as needed. Reported on 03/19/2015      Results for orders placed or performed during the hospital encounter of 03/18/15 (from the past 48 hour(s))  Comprehensive metabolic panel     Status: None   Collection Time: 03/18/15 11:07 PM  Result Value Ref Range   Sodium 139 135 - 145 mmol/L   Potassium 3.8 3.5 - 5.1 mmol/L   Chloride 106 101 - 111 mmol/L   CO2 27 22 - 32 mmol/L   Glucose, Bld 99 65 - 99 mg/dL   BUN 16 6 - 20 mg/dL   Creatinine, Ser 0.66 0.44 - 1.00 mg/dL   Calcium 9.2 8.9 - 10.3 mg/dL   Total Protein 7.2 6.5 - 8.1 g/dL   Albumin 3.7 3.5 - 5.0 g/dL   AST 17 15 - 41 U/L   ALT 17 14 - 54 U/L   Alkaline Phosphatase 88 38 - 126 U/L   Total Bilirubin 0.3 0.3 - 1.2 mg/dL   GFR calc non Af Amer >60 >60 mL/min   GFR calc Af Amer >60 >60 mL/min    Comment: (NOTE) The eGFR has been calculated using the CKD EPI equation. This calculation has not been validated in all clinical situations. eGFR's persistently <60 mL/min signify possible Chronic Kidney Disease.    Anion gap 6 5 - 15  Troponin I     Status: None   Collection Time: 03/18/15 11:07 PM  Result Value Ref Range   Troponin I <0.03 <0.031 ng/mL    Comment:        NO INDICATION OF MYOCARDIAL INJURY.   CBC with Differential     Status: Abnormal   Collection Time: 03/18/15 11:07 PM  Result Value Ref Range   WBC 8.7 3.6 - 11.0 K/uL   RBC 5.22 (H) 3.80 - 5.20 MIL/uL   Hemoglobin 14.2 12.0 - 16.0 g/dL   HCT 42.5 35.0 - 47.0 %   MCV 81.4 80.0 - 100.0 fL   MCH 27.1 26.0 - 34.0 pg   MCHC 33.3 32.0 - 36.0 g/dL   RDW 14.5 11.5 - 14.5 %   Platelets 223 150 - 440 K/uL   Neutrophils Relative % 46 %   Neutro Abs 4.0 1.4 - 6.5 K/uL   Lymphocytes Relative 42 %   Lymphs Abs 3.6 1.0 - 3.6 K/uL   Monocytes  Relative 9 %   Monocytes Absolute 0.8 0.2 - 0.9 K/uL   Eosinophils Relative 2 %   Eosinophils Absolute 0.1 0 - 0.7  K/uL   Basophils Relative 1 %   Basophils Absolute 0.1 0 - 0.1 K/uL  Fibrin derivatives D-Dimer     Status: None   Collection Time: 03/18/15 11:07 PM  Result Value Ref Range   Fibrin derivatives D-dimer ([facility]) 446 0 - 499    Comment: <> Exclusion of Venous Thromboembolism (VTE) - OUTPATIENTS ONLY        (Emergency Department or [place])             0-499 ng/ml (FEU)  : With a low to intermediate pretest                                        probability for VTE this test result                                        excludes the diagnosis of VTE.           > 499 ng/ml (FEU)  : VTE not excluded.  Additional work up                                   for VTE is required.   <>  Testing on Inpatients and Evaluation of Disseminated Intravascular        Coagulation (DIC)             Reference Range:   0-499 ng/ml (FEU)   TSH     Status: None   Collection Time: 03/19/15  2:05 AM  Result Value Ref Range   TSH 3.594 0.350 - 4.500 uIU/mL  Troponin I     Status: None   Collection Time: 03/19/15  2:05 AM  Result Value Ref Range   Troponin I <0.03 <0.031 ng/mL    Comment:        NO INDICATION OF MYOCARDIAL INJURY.    Dg Chest 2 View  03/18/2015  CLINICAL DATA:  Intermittent shortness of breath for 3 days EXAM: CHEST  2 VIEW COMPARISON:  December 21, 2014 FINDINGS: There is no edema or consolidation. The heart size and pulmonary vascularity are normal. No adenopathy. There is atherosclerotic calcification in the aortic arch region. There are no appreciable bone lesions. IMPRESSION: No edema or consolidation. Electronically Signed   By: Lowella Grip III M.D.   On: 03/18/2015 21:34    Review of Systems  Constitutional: Negative for fever and chills.  HENT: Negative for sore throat and tinnitus.   Eyes: Negative for blurred vision and redness.  Respiratory: Positive for  shortness of breath. Negative for cough.   Cardiovascular: Positive for chest pain. Negative for palpitations, orthopnea and PND.  Gastrointestinal: Negative for nausea, vomiting, abdominal pain and diarrhea.  Genitourinary: Negative for dysuria, urgency and frequency.  Musculoskeletal: Negative for myalgias and joint pain.  Skin: Negative for rash.       No lesions  Neurological: Negative for speech change, focal weakness and weakness.  Endo/Heme/Allergies: Does not bruise/bleed easily.       No temperature intolerance  Psychiatric/Behavioral: Negative for depression and suicidal ideas.    Blood pressure 108/62, pulse 78, temperature 97.7 F (36.5 C), temperature source Oral, resp. rate 18, height 5' 3"  (1.6 m), weight 68.266 kg (150 lb 8 oz), SpO2  95 %. Physical Exam  Vitals reviewed. Constitutional: She is oriented to person, place, and time. She appears well-developed and well-nourished. No distress.  HENT:  Head: Normocephalic and atraumatic.  Mouth/Throat: Oropharynx is clear and moist.  Eyes: Conjunctivae and EOM are normal. Pupils are equal, round, and reactive to light. No scleral icterus.  Neck: Normal range of motion. Neck supple. No JVD present. No tracheal deviation present. No thyromegaly present.  Cardiovascular: Normal rate, regular rhythm and normal heart sounds.  Exam reveals no gallop and no friction rub.   No murmur heard. Respiratory: Effort normal and breath sounds normal.  GI: Soft. Bowel sounds are normal. She exhibits no distension. There is no tenderness.  Genitourinary:  Deferred  Lymphadenopathy:    She has no cervical adenopathy.  Neurological: She is alert and oriented to person, place, and time. No cranial nerve deficit. She exhibits normal muscle tone.  Skin: Skin is warm and dry. No rash noted. No erythema.  Psychiatric: She has a normal mood and affect. Her behavior is normal. Judgment and thought content normal.     Assessment/Plan This is a  50 year old Caucasian female admitted for chest pain. 1. Chest pain: Atypical; likely musculoskeletal in origin however the patient has risk factors for coronary artery disease including strong family history and smoking. I will trend the patient's cardiac enzymes; check TSH as well as lipid A1c and lipids. I have ordered a cardiology consult as well as a nuclear stress test. 2. COPD: The patient's symptoms consistent with COPD exacerbation although she may have some air trapping secondary to this disease process that produces some discomfort. Her shortness of breath may be compounded by some anxiety as well. Continuing anxiolytics per home regimen. Continue inhalers per home regimen 3. Tobacco abuse: The patient is a current every day smoker. She declines NicoDerm patch at this time next 4. SLE: Continue Plaquenil per home regimen. The patient's lupus appears to be in remission at this time 5. Depression: Continue Abilify and Cymbalta 6. DVT prophylaxis: Lovenox 7. GI prophylaxis: Pantoprazole due to history of gastroesophageal reflux disease The patient is a full code. Time spent on admission orders and patient care approximately 45 minutes  Harrie Foreman 03/19/2015, 7:10 AM

## 2015-03-19 NOTE — Plan of Care (Signed)
Problem: Phase I Progression Outcomes Goal: Anginal pain relieved Outcome: Completed/Met Date Met:  03/19/15 No chest pain noted during shift

## 2015-03-19 NOTE — Plan of Care (Signed)
Problem: Education: Goal: Knowledge of [place] [facility] Education information/materials will improve Outcome: Progressing Handouts given to patient

## 2015-03-19 NOTE — Consult Note (Signed)
CARDIOLOGY CONSULT NOTE  Patient ID: Andrea [name], MRN: [id number], DOB/AGE: [birth year] 50 y.o. Admit date: 03/18/2015 Date of Consult: 03/19/2015  Primary Physician: Lelon Huh, MD Primary Cardiologist: Dr Fletcher Anon Referring Physician: Dr Marcille Blanco  Chief Complaint: Chest pain  Reason for Consultation: Chest pain  HPI: 50 year old woman who presented yesterday with acute shortness of breath. She describes 3 days of shortness of breath and chest tightness. Primary complaint is shortness of breath and she states that it feels like her "chest is tightening" when this occurs. Symptoms worse when lying supine. There is no pleuritic component. She denies fevers or chills. She does admit to a cough. Her cough is nonproductive. There is no radiation of pain to the neck, shoulders, jaw, or arms. The patient smokes cigarettes. She has been treated for COPD exacerbation and feels better this morning.  Her cardiac history dates back to last year when she had chest pain and underwent cardiac catheterization. This was performed in November 2015 and she was found to have an animal nonobstructive coronary artery disease at that time. She has no history of congestive heart failure. Troponin is negative 3 and d-dimer is within normal limits. There is no leukocytosis or other significant lab abnormalities. Chest x-ray shows no edema or consolidation. Heart size and pulmonary vascularity are normal.  Medical History:  Past Medical History  Diagnosis Date  . PONV (postoperative nausea and vomiting)   . Arthritis     fingers  . COPD (chronic obstructive pulmonary disease) ([place])   . Emphysema/COPD ([place])   . GERD (gastroesophageal reflux disease)   . Anxiety     panic attacks, chest pain  . Hypertension   . Arrhythmia   . Wears dentures     partial upper and lower  . Lupus ([place])   . Anxiety       Surgical History:  Past Surgical History  Procedure Laterality Date  . Vaginal hysterectomy  1995   vaginal  . Cardiac catheterization  01/03/14  . Image guided sinus surgery N/A 11/30/2014    Procedure: IMAGE GUIDED SINUS SURGERY;  Surgeon: Carloyn Manner, MD;  Location: [facility];  Service: ENT;  Laterality: N/A;  GAVE DISK TO CE CE  . Septoplasty N/A 11/30/2014    Procedure: SEPTOPLASTY;  Surgeon: Carloyn Manner, MD;  Location: [facility];  Service: ENT;  Laterality: N/A;  . Maxillary antrostomy Bilateral 11/30/2014    Procedure: MAXILLARY ANTROSTOMY;  Surgeon: Carloyn Manner, MD;  Location: [facility];  Service: ENT;  Laterality: Bilateral;  . Sphenoidectomy Left 11/30/2014    Procedure: Coralee Pesa, ;  Surgeon: Carloyn Manner, MD;  Location: [facility];  Service: ENT;  Laterality: Left;  . Cardiac catheterization       Home Meds: Prior to Admission medications   Medication Sig Start Date End Date Taking? Authorizing Provider  albuterol (PROVENTIL HFA;VENTOLIN HFA) 108 (90 BASE) MCG/ACT inhaler Inhale 2 puffs into the lungs every 6 (six) hours as needed for wheezing or shortness of breath. 11/24/14  Yes Vishal Mungal, MD  ALPRAZolam (XANAX) 1 MG tablet Take 1 mg by mouth 3 (three) times daily as needed for anxiety.   Yes Historical Provider, MD  clonazePAM (KLONOPIN) 1 MG tablet Take 1 mg by mouth 2 (two) times daily.   Yes Historical Provider, MD  hydroxychloroquine (PLAQUENIL) 200 MG tablet Take 400 mg by mouth daily.   Yes Historical Provider, MD  nitroGLYCERIN (NITROSTAT) 0.4 MG SL tablet Place 1 tablet (0.4 mg total)  under the tongue every 5 (five) minutes as needed for chest pain. 12/08/14  Yes Wellington Hampshire, MD  propranolol (INDERAL) 20 MG tablet Take 1 tablet (20 mg total) by mouth 2 (two) times daily. 03/08/15  Yes Birdie Sons, MD  albuterol (PROVENTIL) (2.5 MG/3ML) 0.083% nebulizer solution Take 3 mLs (2.5 mg total) by nebulization every 4 (four) hours as needed for wheezing or shortness of breath. Reported on 03/19/2015 03/19/15    Theodoro Grist, MD  ARIPiprazole (ABILIFY) 5 MG tablet Take 5 mg by mouth daily. Reported on 03/19/2015    Historical Provider, MD  aspirin EC 81 MG EC tablet Take 1 tablet (81 mg total) by mouth daily. 03/19/15   Theodoro Grist, MD  azithromycin (ZITHROMAX Z-PAK) 250 MG tablet Take as directed 03/19/15   Theodoro Grist, MD  budesonide-formoterol (SYMBICORT) 160-4.5 MCG/ACT inhaler Inhale 1 puff into the lungs. Up to 3 times a day    Historical Provider, MD  DULoxetine (CYMBALTA) 60 MG capsule Take 60 mg by mouth daily.    Historical Provider, MD  isosorbide mononitrate (IMDUR) 30 MG 24 hr tablet Take 1 tablet (30 mg total) by mouth at bedtime. 03/19/15   Theodoro Grist, MD  lansoprazole (PREVACID) 30 MG capsule Take 30 mg by mouth 2 (two) times daily before a meal.    Historical Provider, MD  nicotine (NICODERM CQ - DOSED IN MG/24 HOURS) 21 mg/24hr patch Place 1 patch (21 mg total) onto the skin daily. 03/19/15   Theodoro Grist, MD  nicotine polacrilex (NICORETTE) 4 MG gum Take 1 each (4 mg total) by mouth as needed for smoking cessation. 03/19/15   Theodoro Grist, MD  predniSONE (STERAPRED UNI-PAK 21 TAB) 10 MG (21) TBPK tablet Take 1 tablet (10 mg total) by mouth daily. Please take 6 pills first day in the morning, then taper by 1 pill daily until finished 03/19/15   Theodoro Grist, MD  Respiratory Therapy Supplies (FLUTTER) DEVI 1 Device by Does not apply route daily. 03/19/15   Theodoro Grist, MD  tiotropium (SPIRIVA) 18 MCG inhalation capsule Place 18 mcg into inhaler and inhale daily.    Historical Provider, MD  TraZODone HCl 150 MG TB24 Take 150 mg by mouth at bedtime as needed. Reported on 03/19/2015    Historical Provider, MD    Inpatient Medications:  . ARIPiprazole  5 mg Oral Daily  . aspirin EC  81 mg Oral Daily  . budesonide-formoterol  1 puff Inhalation BID  . DULoxetine  60 mg Oral Daily  . enoxaparin (LOVENOX) injection  40 mg Subcutaneous Q24H  . FLUTTER  1 Device Does not apply Daily  .  hydroxychloroquine  400 mg Oral Daily  . ipratropium-albuterol  3 mL Nebulization Q4H  . nicotine  21 mg Transdermal Daily  . pantoprazole  40 mg Oral Daily  . propranolol  20 mg Oral BID  . senna  1 tablet Oral BID  . sodium chloride  3 mL Intravenous Q12H  . tiotropium  18 mcg Inhalation Daily      Allergies:  Allergies  Allergen Reactions  . Alum & Mag Hydroxide-Simeth Diarrhea and Nausea Only  . Cefdinir Other (See Comments)    tachycardia  . Doxycycline Other (See Comments)    Nausea, migraine  . Ivp Dye [Iodinated Diagnostic Agents] Palpitations    Social History   Social History  . Marital Status: Legally Separated    Spouse Name: N/A  . Number of Children: 3  . Years of Education:  N/A   Occupational History  . Disabled     Social Security as of 05/21/2011   Social History Main Topics  . Smoking status: Current Every Day Smoker -- 0.00 packs/day for 16 years    Types: Cigarettes  . Smokeless tobacco: Never Used     Comment: occasional cigarette since quitting last week  . Alcohol Use: No  . Drug Use: No  . Sexual Activity: Not on file   Other Topics Concern  . Not on file   Social History Narrative     Family History  Problem Relation Age of Onset  . Emphysema Father   . Asthma Father   . Heart disease Father   . Cancer Father     colon  . Heart attack Father   . Arthritis Father     RA  . Hypertension Mother      Review of Systems: General: negative for chills, fever, night sweats or weight changes.  ENT: negative for rhinorrhea or epistaxis Cardiovascular: see HPI. No palpitations, edema, or PND. Dermatological: negative for rash Respiratory: negative for wheezing GI: negative for nausea, vomiting, diarrhea, bright red blood per rectum, melena, or hematemesis GU: no hematuria, urgency, or frequency Neurologic: negative for visual changes, syncope, headache, or dizziness Heme: no easy bruising or bleeding Endo: negative for excessive  thirst, thyroid disorder, or flushing Musculoskeletal: negative for joint pain or swelling, negative for myalgias  All other systems reviewed and are otherwise negative except as noted above.  Physical Exam: Blood pressure 100/58, pulse 76, temperature 97.7 F (36.5 C), temperature source Oral, resp. rate 18, height 5\' 3"  (1.6 m), weight 150 lb 8 oz (68.266 kg), SpO2 97 %. Pt is alert and oriented, WD, WN, in no distress. HEENT: normal Neck: JVP normal. Carotid upstrokes normal without bruits. No thyromegaly. Lungs: equal expansion, clear bilaterally CV: Apex is discrete and nondisplaced, RRR without murmur or gallop Abd: soft, NT, +BS, no bruit, no hepatosplenomegaly Back: no CVA tenderness Ext: no C/C/E        DP/PT pulses intact and = Skin: warm and dry without rash Neuro: CNII-XII intact             Strength intact = bilaterally    Labs:  Recent Labs  03/18/15 2307 03/19/15 0205 03/19/15 0812  TROPONINI <0.03 <0.03 <0.03   Lab Results  Component Value Date   WBC 8.7 03/18/2015   HGB 14.2 03/18/2015   HCT 42.5 03/18/2015   MCV 81.4 03/18/2015   PLT 223 03/18/2015    Recent Labs Lab 03/18/15 2307  NA 139  K 3.8  CL 106  CO2 27  BUN 16  CREATININE 0.66  CALCIUM 9.2  PROT 7.2  BILITOT 0.3  ALKPHOS 88  ALT 17  AST 17  GLUCOSE 99   Lab Results  Component Value Date   CHOL 193 03/19/2015   HDL 34* 03/19/2015   LDLCALC 139* 03/19/2015   TRIG 102 03/19/2015   No results found for: DDIMER  Radiology/Studies:  Dg Chest 2 View  03/18/2015  CLINICAL DATA:  Intermittent shortness of breath for 3 days EXAM: CHEST  2 VIEW COMPARISON:  December 21, 2014 FINDINGS: There is no edema or consolidation. The heart size and pulmonary vascularity are normal. No adenopathy. There is atherosclerotic calcification in the aortic arch region. There are no appreciable bone lesions. IMPRESSION: No edema or consolidation. Electronically Signed   By: Lowella Grip III M.D.    On: 03/18/2015 21:34  EKG: Normal sinus rhythm, nonspecific T wave abnormality, no significant change compared to previous tracings.  Cardiac Studies: Troponin negative 3  Cardiac catheterization 01/03/2014: Minor irregularity with no significant stenosis, systemic hypertension with mildly elevated LVEDP.  ASSESSMENT AND PLAN:  1. Shortness of breath: suspect COPD exacerbation. Clinically improved with treatment by Medicine team (solumedrol, Zithromax).  2. Chest pain, atypical. Minimal CAD at cath in 2015. With ongoing tobacco abuse, reasonable to consider ischemia. Favor outpatient stress Myoview test as enzymes negative and no acute EKG changes.   3. Tobacco abuse: cessation counseling done  4. Low BP: continue home treatment. Don't think she will tolerate isosorbide or any anti-anginal medications. OK to use NTG prn as she's used this in past for suspected esophageal spasm.  Will arrange outpatient stress Myoview and FU with Dr Fletcher Anon.  Deatra James MD, [facility] 03/19/2015, 11:28 AM

## 2015-03-19 NOTE — Discharge Summary (Signed)
[facility] [place] NAME: Andrea [name]    MR#:  [id number]  DATE OF BIRTH:  29-Nov-1965  DATE OF ADMISSION:  03/18/2015 ADMITTING PHYSICIAN: Harrie Foreman, MD  DATE OF DISCHARGE: 03/19/2015  1:47 PM  PRIMARY CARE PHYSICIAN: Lelon Huh, MD     ADMISSION DIAGNOSIS:  Dyspnea [R06.00]  DISCHARGE DIAGNOSIS:  Principal Problem:   Chest pain Active Problems:   Dyspnea   Tobacco abuse counseling   Hypotension   Hyperlipidemia   SECONDARY DIAGNOSIS:   Past Medical History  Diagnosis Date  . PONV (postoperative nausea and vomiting)   . Arthritis     fingers  . COPD (chronic obstructive pulmonary disease) ([place])   . Emphysema/COPD ([place])   . GERD (gastroesophageal reflux disease)   . Anxiety     panic attacks, chest pain  . Hypertension   . Arrhythmia   . Wears dentures     partial upper and lower  . Lupus ([place])   . Anxiety     .pro HOSPITAL COURSE:  Patient is a 50 year old Caucasian female with history of tobacco abuse who presents to the hospital with complaints of shortness of breath and chest tightening, worsened when patient was laying supine, no pleuritic component, patient admitted of cough, nonproductive, there was no radiation. On arrival to the hospital. Patient's labs revealed normal cardiac enzymes 3, normal BMP, liver enzymes as well as CBC was mildly elevated red blood cell count, likely secondary to polycythemia. Patient is admitted to the hospital for further evaluation and cardiology consultation was obtained. Patient was seen by Dr. Burt Knack, who felt that patient's shortness of breath was very likely COPD exacerbation, and gastric chest pain if he felt it was atypical. Dr. Burt Knack admitted. The patient had minimal coronary artery disease and cardiac catheterization 2015, but with ongoing tobacco abuse and recommended to get outpatient stress test with Dr. Fletcher Anon. Regarding hypertension, he recommended to  continue home therapy, but he does not feel that patient would tolerate isosorbide MN. He recommended to discharge patient with follow-up with cardiology Discussion by problem 1. Chest pain, unclear etiology. Cardiac enzymes were negative, patient is to follow-up with cardiologist for outpatient stress test. She is to continue propranolol, Imdur, aspirin until stress test is performed 2. COPD exacerbation, likely due to acute bronchitis, patient was initiated on prednisone taper, Zithromax, she was advised to continue doing nebs . Follow-up with primary care physician for recommendations 3. Tobacco abuse counseling. Discussed this patient for approximately 3-4 minutes. Nicotine replacement therapy wasn't initiated upon discharge  4. Dyspnea due to COPD , oxygen saturations on exertion prior to leaving hospital were checked and they were normal 5. Hypotension, likely due to propranolol, patient seemed to be tolerating hypotension very well, but now she will be also on Imdur, which may decrease her blood pressure even lower, for this reason, Imdur was recommended to be given at nighttime, patient is to follow up with cardiologist for stress test as outpatient.    DISCHARGE CONDITIONS:   Stable  CONSULTS OBTAINED:  Treatment Team:  Sherren Mocha, MD  DRUG ALLERGIES:   Allergies  Allergen Reactions  . Alum & Mag Hydroxide-Simeth Diarrhea and Nausea Only  . Cefdinir Other (See Comments)    tachycardia  . Doxycycline Other (See Comments)    Nausea, migraine  . Ivp Dye [Iodinated Diagnostic Agents] Palpitations    DISCHARGE MEDICATIONS:   Discharge Medication List as of 03/19/2015 12:36 PM    START  taking these medications   Details  aspirin EC 81 MG EC tablet Take 1 tablet (81 mg total) by mouth daily., Starting 03/19/2015, Until Discontinued, Normal    azithromycin (ZITHROMAX Z-PAK) 250 MG tablet Take as directed, Normal    isosorbide mononitrate (IMDUR) 30 MG 24 hr tablet Take 1  tablet (30 mg total) by mouth at bedtime., Starting 03/19/2015, Until Discontinued, Normal    nicotine (NICODERM CQ - DOSED IN MG/24 HOURS) 21 mg/24hr patch Place 1 patch (21 mg total) onto the skin daily., Starting 03/19/2015, Until Discontinued, Normal    nicotine polacrilex (NICORETTE) 4 MG gum Take 1 each (4 mg total) by mouth as needed for smoking cessation., Starting 03/19/2015, Until Discontinued, Normal    predniSONE (STERAPRED UNI-PAK 21 TAB) 10 MG (21) TBPK tablet Take 1 tablet (10 mg total) by mouth daily. Please take 6 pills first day in the morning, then taper by 1 pill daily until finished, Starting 03/19/2015, Until Discontinued, Normal      CONTINUE these medications which have CHANGED   Details  albuterol (PROVENTIL) (2.5 MG/3ML) 0.083% nebulizer solution Take 3 mLs (2.5 mg total) by nebulization every 4 (four) hours as needed for wheezing or shortness of breath. Reported on 03/19/2015, Starting 03/19/2015, Until Discontinued, Normal    Respiratory Therapy Supplies (FLUTTER) DEVI 1 Device by Does not apply route daily., Starting 03/19/2015, Until Discontinued, Normal      CONTINUE these medications which have NOT CHANGED   Details  albuterol (PROVENTIL HFA;VENTOLIN HFA) 108 (90 BASE) MCG/ACT inhaler Inhale 2 puffs into the lungs every 6 (six) hours as needed for wheezing or shortness of breath., Starting 11/24/2014, Until Discontinued, Normal    ALPRAZolam (XANAX) 1 MG tablet Take 1 mg by mouth 3 (three) times daily as needed for anxiety., Until Discontinued, Historical Med    clonazePAM (KLONOPIN) 1 MG tablet Take 1 mg by mouth 2 (two) times daily., Until Discontinued, Historical Med    hydroxychloroquine (PLAQUENIL) 200 MG tablet Take 400 mg by mouth daily., Until Discontinued, Historical Med    nitroGLYCERIN (NITROSTAT) 0.4 MG SL tablet Place 1 tablet (0.4 mg total) under the tongue every 5 (five) minutes as needed for chest pain., Starting 12/08/2014, Until Discontinued, Normal     propranolol (INDERAL) 20 MG tablet Take 1 tablet (20 mg total) by mouth 2 (two) times daily., Starting 03/08/2015, Until Discontinued, Normal    ARIPiprazole (ABILIFY) 5 MG tablet Take 5 mg by mouth daily. Reported on 03/19/2015, Until Discontinued, Historical Med    budesonide-formoterol (SYMBICORT) 160-4.5 MCG/ACT inhaler Inhale 1 puff into the lungs. Up to 3 times a day, Until Discontinued, Historical Med    DULoxetine (CYMBALTA) 60 MG capsule Take 60 mg by mouth daily., Until Discontinued, Historical Med    lansoprazole (PREVACID) 30 MG capsule Take 30 mg by mouth 2 (two) times daily before a meal., Until Discontinued, Historical Med    tiotropium (SPIRIVA) 18 MCG inhalation capsule Place 18 mcg into inhaler and inhale daily., Until Discontinued, Historical Med    TraZODone HCl 150 MG TB24 Take 150 mg by mouth at bedtime as needed. Reported on 03/19/2015, Until Discontinued, Historical Med         DISCHARGE INSTRUCTIONS:    Patient is to follow-up with primary care physician and cardiologist  If you experience worsening of your admission symptoms, develop shortness of breath, life threatening emergency, suicidal or homicidal thoughts you must seek medical attention immediately by calling 911 or calling your MD immediately  if symptoms  less severe.  You Must read complete instructions/literature along with all the possible adverse reactions/side effects for all the Medicines you take and that have been prescribed to you. Take any new Medicines after you have completely understood and accept all the possible adverse reactions/side effects.   Please note  You were cared for by a hospitalist during your hospital stay. If you have any questions about your discharge medications or the care you received while you were in the hospital after you are discharged, you can call the unit and asked to speak with the hospitalist on call if the hospitalist that took care of you is not available.  Once you are discharged, your primary care physician will handle any further medical issues. Please note that NO REFILLS for any discharge medications will be authorized once you are discharged, as it is imperative that you return to your primary care physician (or establish a relationship with a primary care physician if you do not have one) for your aftercare needs so that they can reassess your need for medications and monitor your lab values.    Today   CHIEF COMPLAINT:   Chief Complaint  Patient presents with  . Shortness of Breath    HISTORY OF PRESENT ILLNESS:  Andrea [name]  is a 50 y.o. female with a known history of COPD, tobacco abuse who presents to the hospital with complaints of shortness of breath and chest tightening, worsened when patient was laying supine, no pleuritic component, patient admitted of cough, nonproductive, there was no radiation. On arrival to the hospital. Patient's labs revealed normal cardiac enzymes 3, normal BMP, liver enzymes as well as CBC was mildly elevated red blood cell count, likely secondary to polycythemia. Patient is admitted to the hospital for further evaluation and cardiology consultation was obtained. Patient was seen by Dr. Burt Knack, who felt that patient's shortness of breath was very likely COPD exacerbation, and gastric chest pain if he felt it was atypical. Dr. Burt Knack admitted. The patient had minimal coronary artery disease and cardiac catheterization 2015, but with ongoing tobacco abuse and recommended to get outpatient stress test with Dr. Fletcher Anon. Regarding hypertension, he recommended to continue home therapy, but he does not feel that patient would tolerate isosorbide MN. He recommended to discharge patient with follow-up with cardiology Discussion by problem 1. Chest pain, unclear etiology. Cardiac enzymes were negative, patient is to follow-up with cardiologist for outpatient stress test. She is to continue propranolol, Imdur, aspirin  until stress test is performed 2. COPD exacerbation, likely due to acute bronchitis, patient was initiated on prednisone taper, Zithromax, she was advised to continue doing nebs . Follow-up with primary care physician for recommendations 3. Tobacco abuse counseling. Discussed this patient for approximately 3-4 minutes. Nicotine replacement therapy wasn't initiated upon discharge  4. Dyspnea due to COPD , oxygen saturations on exertion prior to leaving hospital were checked and they were normal 5. Hypotension, likely due to propranolol, patient seemed to be tolerating hypotension very well, but now she will be also on Imdur, which may decrease her blood pressure even lower, for this reason, Imdur was recommended to be given at nighttime, patient is to follow up with cardiologist for stress test as outpatient   VITAL SIGNS:  Blood pressure 100/58, pulse 76, temperature 97.7 F (36.5 C), temperature source Oral, resp. rate 18, height 5\' 3"  (1.6 m), weight 68.266 kg (150 lb 8 oz), SpO2 97 %.  I/O:   Intake/Output Summary (Last 24 hours) at 03/19/15 1519 Last  data filed at 03/19/15 1001  Gross per 24 hour  Intake      3 ml  Output      0 ml  Net      3 ml    PHYSICAL EXAMINATION:  GENERAL:  50 y.o.-year-old patient lying in the bed with no acute distress.  EYES: Pupils equal, round, reactive to light and accommodation. No scleral icterus. Extraocular muscles intact.  HEENT: Head atraumatic, normocephalic. Oropharynx and nasopharynx clear.  NECK:  Supple, no jugular venous distention. No thyroid enlargement, no tenderness.  LUNGS: Markedly diminished breath sounds bilaterally posteriorly, no wheezing, rales,rhonchi or crepitation. Intermittent use of accessory muscles of respiration, mostly on exertion.  CARDIOVASCULAR: S1, S2 normal. No murmurs, rubs, or gallops.  ABDOMEN: Soft, non-tender, non-distended. Bowel sounds present. No organomegaly or mass.  EXTREMITIES: No pedal edema, cyanosis,  or clubbing.  NEUROLOGIC: Cranial nerves II through XII are intact. Muscle strength 5/5 in all extremities. Sensation intact. Gait not checked.  PSYCHIATRIC: The patient is alert and oriented x 3.  SKIN: No obvious rash, lesion, or ulcer.   DATA REVIEW:   CBC  Recent Labs Lab 03/18/15 2307  WBC 8.7  HGB 14.2  HCT 42.5  PLT 223    Chemistries   Recent Labs Lab 03/18/15 2307  NA 139  K 3.8  CL 106  CO2 27  GLUCOSE 99  BUN 16  CREATININE 0.66  CALCIUM 9.2  AST 17  ALT 17  ALKPHOS 88  BILITOT 0.3    Cardiac Enzymes  Recent Labs Lab 03/19/15 0812  TROPONINI <0.03    Microbiology Results  Results for orders placed or performed during the hospital encounter of 01/10/15  Culture, expectorated sputum-assessment     Status: None   Collection Time: 01/10/15  9:00 AM  Result Value Ref Range Status   Specimen Description SPUTUM  Final   Special Requests NONE  Final   Sputum evaluation THIS SPECIMEN IS ACCEPTABLE FOR SPUTUM CULTURE  Final   Report Status 01/10/2015 FINAL  Final  Culture, respiratory (NON-Expectorated)     Status: None   Collection Time: 01/10/15  9:00 AM  Result Value Ref Range Status   Specimen Description SPUTUM  Final   Special Requests NONE Reflexed from [id number]  Final   Gram Stain   Final    FEW WBC SEEN MANY GRAM POSITIVE COCCI IN CHAINS IN CLUSTERS    Culture Consistent with normal respiratory flora.  Final   Report Status 01/12/2015 FINAL  Final    RADIOLOGY:  Dg Chest 2 View  03/18/2015  CLINICAL DATA:  Intermittent shortness of breath for 3 days EXAM: CHEST  2 VIEW COMPARISON:  December 21, 2014 FINDINGS: There is no edema or consolidation. The heart size and pulmonary vascularity are normal. No adenopathy. There is atherosclerotic calcification in the aortic arch region. There are no appreciable bone lesions. IMPRESSION: No edema or consolidation. Electronically Signed   By: Lowella Grip III M.D.   On: 03/18/2015 21:34    EKG:    Orders placed or performed during the hospital encounter of 03/18/15  . ED EKG  . ED EKG  . ED EKG  . ED EKG      Management plans discussed with the patient, family and they are in agreement.  CODE STATUS:     Code Status Orders        Start     Ordered   03/19/15 0152  Full code   Continuous  03/19/15 0151    Code Status History    Date Active Date Inactive Code Status Order ID Comments User Context   This patient has a current code status but no historical code status.      TOTAL TIME TAKING CARE OF THIS PATIENT: 40 minutes.    Theodoro Grist M.D on 03/19/2015 at 3:19 PM  Between 7am to 6pm - Pager - [phone]  After 6pm go to [email or web address] - password [email or web address] [facility]  Office  [phone]  CC: Primary care physician; Lelon Huh, MD

## 2015-03-19 NOTE — Progress Notes (Signed)
[facility] at [place] NAME: Andrea [name]    MR#:  [id number]  DATE OF BIRTH:  October 21, 1965  SUBJECTIVE:  CHIEF COMPLAINT:   Chief Complaint  Patient presents with  . Shortness of Breath   Patient is 50 year old Caucasian female with history of tobacco abuse, ongoing, who presents to the hospital with complaints of chest pains as well as shortness of breath. Chest pain was described as substernal, accompanied by nausea. Patient was admitted to the hospital for further evaluation. In the hospital, she was noted to be intermittently hypotensive with systolic blood pressure at 98, although not tachycardic. Patient has been using propranolol. Denies any chest pains today, or shortness of breath. Continues to smoke and I held her long discussion about smoking risks, agreeable to try nicotine patch and nicotine gum   Review of Systems  Constitutional: Negative for fever, chills and weight loss.  HENT: Negative for congestion.   Eyes: Negative for blurred vision and double vision.  Respiratory: Negative for cough, sputum production, shortness of breath and wheezing.   Cardiovascular: Negative for chest pain, palpitations, orthopnea, leg swelling and PND.  Gastrointestinal: Negative for nausea, vomiting, abdominal pain, diarrhea, constipation and blood in stool.  Genitourinary: Negative for dysuria, urgency, frequency and hematuria.  Musculoskeletal: Negative for falls.  Neurological: Negative for dizziness, tremors, focal weakness and headaches.  Endo/Heme/Allergies: Does not bruise/bleed easily.  Psychiatric/Behavioral: Negative for depression. The patient does not have insomnia.     VITAL SIGNS: Blood pressure 100/58, pulse 76, temperature 97.7 F (36.5 C), temperature source Oral, resp. rate 18, height 5\' 3"  (1.6 m), weight 68.266 kg (150 lb 8 oz), SpO2 97 %.  PHYSICAL EXAMINATION:   GENERAL:  50 y.o.-year-old patient lying in the bed  with no acute distress.  EYES: Pupils equal, round, reactive to light and accommodation. No scleral icterus. Extraocular muscles intact.  HEENT: Head atraumatic, normocephalic. Oropharynx and nasopharynx clear.  NECK:  Supple, no jugular venous distention. No thyroid enlargement, no tenderness.  LUNGS: Markedly diminished breath sounds bilaterally, no wheezing, rales,rhonchi or crepitation. Intermittently using  accessory muscles of respiration, with exertion and talk.  CARDIOVASCULAR: S1, S2 normal. No murmurs, rubs, or gallops.  ABDOMEN: Soft, nontender, nondistended. Bowel sounds present. No organomegaly or mass.  EXTREMITIES: No pedal edema, cyanosis, or clubbing.  NEUROLOGIC: Cranial nerves II through XII are intact. Muscle strength 5/5 in all extremities. Sensation intact. Gait not checked.  PSYCHIATRIC: The patient is alert and oriented x 3.  SKIN: No obvious rash, lesion, or ulcer.   ORDERS/RESULTS REVIEWED:   CBC  Recent Labs Lab 03/18/15 2307  WBC 8.7  HGB 14.2  HCT 42.5  PLT 223  MCV 81.4  MCH 27.1  MCHC 33.3  RDW 14.5  LYMPHSABS 3.6  MONOABS 0.8  EOSABS 0.1  BASOSABS 0.1   ------------------------------------------------------------------------------------------------------------------  Chemistries   Recent Labs Lab 03/18/15 2307  NA 139  K 3.8  CL 106  CO2 27  GLUCOSE 99  BUN 16  CREATININE 0.66  CALCIUM 9.2  AST 17  ALT 17  ALKPHOS 88  BILITOT 0.3   ------------------------------------------------------------------------------------------------------------------ estimated creatinine clearance is 77.2 mL/min (by C-G formula based on Cr of 0.66). ------------------------------------------------------------------------------------------------------------------  Recent Labs  03/19/15 0205  TSH 3.594    Cardiac Enzymes  Recent Labs Lab 03/18/15 2307 03/19/15 0205 03/19/15 0812  TROPONINI <0.03 <0.03 <0.03    ------------------------------------------------------------------------------------------------------------------ Invalid input(s): POCBNP ---------------------------------------------------------------------------------------------------------------  RADIOLOGY: Dg Chest 2 View  03/18/2015  CLINICAL DATA:  Intermittent shortness of breath for 3 days EXAM: CHEST  2 VIEW COMPARISON:  December 21, 2014 FINDINGS: There is no edema or consolidation. The heart size and pulmonary vascularity are normal. No adenopathy. There is atherosclerotic calcification in the aortic arch region. There are no appreciable bone lesions. IMPRESSION: No edema or consolidation. Electronically Signed   By: Lowella Grip III M.D.   On: 03/18/2015 21:34    EKG:  Orders placed or performed during the hospital encounter of 03/18/15  . ED EKG  . ED EKG  . ED EKG  . ED EKG    ASSESSMENT AND PLAN:  Principal Problem:   Chest pain Active Problems:   Dyspnea   Tobacco abuse counseling   Hypotension   Hyperlipidemia 1. Chest pain, unclear etiology. Cardiac enzymes were negative, awaiting for cardiologist evaluation. Patient will be ambulated while in the hospital and followed for symptoms and if no chest pain, recurrence, possible discharge home with outpatient stress test, add Imdur, aspirin until stress test is performed 2. COPD exacerbation, likely due to acute bronchitis, continue patient on duo nebs while in the hospital. Add prednisone taper upon  discharge, give one dose of Solu-Medrol while in the hospital, Initiate patient on Zithromax . Follow-up with primary care physician for recommendations 3. Tobacco abuse counseling. Discussed this patient for approximately 3-4 minutes. Nicotine replacement therapy will be initiated upon discharge   4. Dyspnea due to COPD , check oxygen saturations on exertion prior to leaving hospital 5. Hypotension, likely due to propranolol, patient seemed to be tolerating.  Hypertension, very well, follow as outpatient.    Management plans discussed with the patient, family and they are in agreement.   DRUG ALLERGIES:  Allergies  Allergen Reactions  . Alum & Mag Hydroxide-Simeth Diarrhea and Nausea Only  . Cefdinir Other (See Comments)    tachycardia  . Doxycycline Other (See Comments)    Nausea, migraine  . Ivp Dye [Iodinated Diagnostic Agents] Palpitations    CODE STATUS:     Code Status Orders        Start     Ordered   03/19/15 0152  Full code   Continuous     03/19/15 0151    Code Status History    Date Active Date Inactive Code Status Order ID Comments User Context   This patient has a current code status but no historical code status.      TOTAL TIME TAKING CARE OF THIS PATIENT: 40 minutes.  Discussed extensively with patient and family   Nikolis Berent M.D on 03/19/2015 at 10:31 AM  Between 7am to 6pm - Pager - [phone]  After 6pm go to [email or web address] - password [email or web address]  Office  [phone]  CC: Primary care physician; Lelon Huh, MD

## 2015-03-19 NOTE — ED Notes (Signed)
IV put in A/C due to chance of getting ct/angio.

## 2015-03-19 NOTE — Progress Notes (Signed)
.   Informed Dr. Lavetta Nielsen that patient  Takes clonazepam 1mg  BID. New order: clonazepam.

## 2015-03-19 NOTE — Plan of Care (Signed)
Problem: Phase II Progression Outcomes Goal: Stress Test if indicated Outcome: Completed/Met Date Met:  03/19/15 Stress test scheduled for today

## 2015-03-19 NOTE — Progress Notes (Signed)
Discharge instructions given. Prescriptions are at patient's pharmacy. IV and tele removed. Education given on COPD, chest pain, and new medications, as well as smoking cessation tips. No questions. Will follow up with Dr. Tyrell Antonio office for stress test. Advised patient to check her blood pressure at home before taking medications.

## 2015-03-19 NOTE — Care Management Obs Status (Signed)
[facility] NOTIFICATION   Patient Details  Name: Andrea [name] MRN: [id number] Date of Birth: 08-Dec-1965   Medicare Observation Status Notification Given:  No (admitted and discharged on 1.14.17)    Ival Bible, RN 03/19/2015, 2:29 PM

## 2015-03-20 ENCOUNTER — Telehealth: Payer: Self-pay

## 2015-03-20 ENCOUNTER — Other Ambulatory Visit: Payer: Self-pay

## 2015-03-20 DIAGNOSIS — R0602 Shortness of breath: Secondary | ICD-10-CM

## 2015-03-20 NOTE — Telephone Encounter (Signed)
Per Dr. Burt Knack: "Can we set patient up for [facility] and follow-up.  Thanks "  Pt seen in ER over the weekend.  S/w pt who is agreeable w/ lexi myoview Wed, Jan 18, 8:30am Reviewed pt instructions including medications to hold. Offered to print instructions and pt p/u at our office but she states she will write down information as I review it with her. Pt verbalized understanding with no further questions.

## 2015-03-22 ENCOUNTER — Ambulatory Visit
Admission: RE | Admit: 2015-03-22 | Discharge: 2015-03-22 | Disposition: A | Discharge status: Home / Self Care | Payer: Medicare Other | Source: Ambulatory Visit | Attending: Cardiovascular Disease | Admitting: Cardiovascular Disease

## 2015-03-22 DIAGNOSIS — R0602 Shortness of breath: Secondary | ICD-10-CM | POA: Diagnosis not present

## 2015-03-22 LAB — NM MYOCAR MULTI W/SPECT W/WALL MOTION / EF
CHL CUP NUCLEAR SRS: 0
CHL CUP RESTING HR STRESS: 86 {beats}/min
CHL CUP STRESS STAGE 1 GRADE: 0 %
CHL CUP STRESS STAGE 10 HR: 114 {beats}/min
CHL CUP STRESS STAGE 10 SPEED: 0 mph
CHL CUP STRESS STAGE 2 GRADE: 0 %
CHL CUP STRESS STAGE 3 HR: 117 {beats}/min
CHL CUP STRESS STAGE 3 SBP: 109 mmHg
CHL CUP STRESS STAGE 4 DBP: 68 mmHg
CHL CUP STRESS STAGE 6 GRADE: 0 %
CHL CUP STRESS STAGE 6 SBP: 116 mmHg
CHL CUP STRESS STAGE 6 SPEED: 0 mph
CHL CUP STRESS STAGE 9 GRADE: 0 %
CHL CUP STRESS STAGE 9 SPEED: 0 mph
CSEPEW: 1 METS
CSEPHR: 79 %
CSEPPHR: 114 {beats}/min
CSEPPMHR: 67 %
LV dias vol: 45 mL
LVSYSVOL: 18 mL
NUC STRESS TID: 0.79
SDS: 0
SSS: 0
Stage 1 HR: 87 {beats}/min
Stage 1 Speed: 0 mph
Stage 10 Grade: 0 %
Stage 2 HR: 87 {beats}/min
Stage 2 Speed: 0 mph
Stage 3 DBP: 63 mmHg
Stage 3 Grade: 0 %
Stage 3 Speed: 0 mph
Stage 4 Grade: 0 %
Stage 4 HR: 115 {beats}/min
Stage 4 SBP: 117 mmHg
Stage 4 Speed: 0 mph
Stage 5 Grade: 0 %
Stage 5 HR: 113 {beats}/min
Stage 5 Speed: 0 mph
Stage 6 DBP: 62 mmHg
Stage 6 HR: 114 {beats}/min
Stage 7 Grade: 0 %
Stage 7 HR: 116 {beats}/min
Stage 7 Speed: 0 mph
Stage 8 Grade: 0 %
Stage 8 HR: 112 {beats}/min
Stage 8 Speed: 0 mph
Stage 9 HR: 112 {beats}/min

## 2015-03-22 MED ORDER — REGADENOSON 0.4 MG/5ML IV SOLN
0.4000 mg | Freq: Once | INTRAVENOUS | Status: AC
  Administered 2015-03-22: 0.4 mg via INTRAVENOUS

## 2015-03-22 MED ORDER — TECHNETIUM TC 99M SESTAMIBI - CARDIOLITE
32.2310 | Freq: Once | INTRAVENOUS | Status: AC | PRN
  Administered 2015-03-22: 10:00:00 32.231 via INTRAVENOUS

## 2015-03-22 MED ORDER — TECHNETIUM TC 99M SESTAMIBI - CARDIOLITE
13.9840 | Freq: Once | INTRAVENOUS | Status: AC | PRN
  Administered 2015-03-22: 13.984 via INTRAVENOUS

## 2015-03-24 NOTE — Telephone Encounter (Signed)
Erroneous entry

## 2015-03-27 ENCOUNTER — Other Ambulatory Visit: Payer: Self-pay | Admitting: Family Medicine

## 2015-03-28 NOTE — Telephone Encounter (Signed)
Please call in alprazolam.  

## 2015-04-03 ENCOUNTER — Encounter: Payer: Self-pay | Admitting: Physician Assistant

## 2015-04-05 ENCOUNTER — Ambulatory Visit: Payer: Medicare Other | Admitting: Physician Assistant

## 2015-05-30 ENCOUNTER — Other Ambulatory Visit: Payer: Self-pay | Admitting: Family Medicine

## 2015-05-30 DIAGNOSIS — J00 Acute nasopharyngitis [common cold]: Secondary | ICD-10-CM | POA: Diagnosis not present

## 2015-05-31 NOTE — Telephone Encounter (Signed)
RX called in at [facility]  

## 2015-05-31 NOTE — Telephone Encounter (Signed)
Please call in clonazepam  

## 2015-06-19 ENCOUNTER — Emergency Department
Admission: EM | Admit: 2015-06-19 | Discharge: 2015-06-19 | Disposition: A | Discharge status: Home / Self Care | Payer: Medicare Other | Attending: Emergency Medicine | Admitting: Emergency Medicine

## 2015-06-19 ENCOUNTER — Emergency Department: Payer: Medicare Other

## 2015-06-19 ENCOUNTER — Encounter: Payer: Self-pay | Admitting: *Deleted

## 2015-06-19 DIAGNOSIS — J019 Acute sinusitis, unspecified: Secondary | ICD-10-CM | POA: Diagnosis not present

## 2015-06-19 DIAGNOSIS — I2510 Atherosclerotic heart disease of native coronary artery without angina pectoris: Secondary | ICD-10-CM | POA: Diagnosis not present

## 2015-06-19 DIAGNOSIS — F17210 Nicotine dependence, cigarettes, uncomplicated: Secondary | ICD-10-CM | POA: Insufficient documentation

## 2015-06-19 DIAGNOSIS — J209 Acute bronchitis, unspecified: Secondary | ICD-10-CM | POA: Insufficient documentation

## 2015-06-19 DIAGNOSIS — Z79899 Other long term (current) drug therapy: Secondary | ICD-10-CM | POA: Diagnosis not present

## 2015-06-19 DIAGNOSIS — J441 Chronic obstructive pulmonary disease with (acute) exacerbation: Secondary | ICD-10-CM | POA: Diagnosis not present

## 2015-06-19 DIAGNOSIS — R0602 Shortness of breath: Secondary | ICD-10-CM | POA: Diagnosis not present

## 2015-06-19 DIAGNOSIS — J440 Chronic obstructive pulmonary disease with acute lower respiratory infection: Secondary | ICD-10-CM | POA: Diagnosis not present

## 2015-06-19 DIAGNOSIS — Z7982 Long term (current) use of aspirin: Secondary | ICD-10-CM | POA: Insufficient documentation

## 2015-06-19 DIAGNOSIS — I10 Essential (primary) hypertension: Secondary | ICD-10-CM | POA: Diagnosis not present

## 2015-06-19 LAB — CBC
HEMATOCRIT: 40.9 % (ref 35.0–47.0)
HEMOGLOBIN: 13.9 g/dL (ref 12.0–16.0)
MCH: 27.8 pg (ref 26.0–34.0)
MCHC: 34 g/dL (ref 32.0–36.0)
MCV: 81.7 fL (ref 80.0–100.0)
Platelets: 191 10*3/uL (ref 150–440)
RBC: 5 MIL/uL (ref 3.80–5.20)
RDW: 14.3 % (ref 11.5–14.5)
WBC: 7.2 10*3/uL (ref 3.6–11.0)

## 2015-06-19 LAB — TROPONIN I

## 2015-06-19 LAB — BASIC METABOLIC PANEL WITH GFR
Anion gap: 7 (ref 5–15)
BUN: 11 mg/dL (ref 6–20)
CO2: 25 mmol/L (ref 22–32)
Calcium: 9 mg/dL (ref 8.9–10.3)
Chloride: 102 mmol/L (ref 101–111)
Creatinine, Ser: 0.8 mg/dL (ref 0.44–1.00)
GFR calc Af Amer: >60 mL/min
GFR calc non Af Amer: >60 mL/min
Glucose, Bld: 101 mg/dL — ABNORMAL HIGH (ref 65–99)
Potassium: 4.2 mmol/L (ref 3.5–5.1)
Sodium: 134 mmol/L — ABNORMAL LOW (ref 135–145)

## 2015-06-19 MED ORDER — PREDNISONE 20 MG PO TABS: 20.0000 mg | ORAL_TABLET | Freq: Every day | ORAL | Status: DC

## 2015-06-19 MED ORDER — AZITHROMYCIN 250 MG PO TABS: ORAL_TABLET | ORAL | Status: DC

## 2015-06-19 NOTE — ED Provider Notes (Signed)
[facility] Emergency Department Provider Note  ____________________________________________   I have reviewed the triage vital signs and the nursing notes.   HISTORY  Chief Complaint Shortness of Breath    HPI Andrea [name] is a 50 y.o. female presents today with a cough and runny nose and low-grade fevers. She has COPD. She has been using an inhaler. She is not on steroids. She was given an antibiotic but she does not like it she states it gave her headache when she took it. Patient denies any chest pain, she states that she is been able to keep her wheeze under control using her home inhaler. She has not run out.She denies any history of PE or DVT or any lower extremity swelling or recent travel. Did have a heart catheterization 2050 that did not show any lesions requiring intervention. Patient denies chest pain she states she just has runny nose and cough. Feels like a COPD flare. She does continue to smoke.Marland Kitchen      Past Medical History  Diagnosis Date  . PONV (postoperative nausea and vomiting)   . Arthritis     fingers  . COPD (chronic obstructive pulmonary disease) ([place])   . Emphysema/COPD ([place])   . GERD (gastroesophageal reflux disease)   . Anxiety     panic attacks, chest pain  . Hypertension   . Arrhythmia   . Wears dentures     partial upper and lower  . Lupus ([place])   . Anxiety   . CAD in native artery     a. cardiac cath 01/2014: showed minor irregularities, mildly elevated left ventricular end-diastolic pressure and normal ejection fraction    Patient Active Problem List   Diagnosis Date Noted  . Hypotension 03/19/2015  . Dyspnea 03/19/2015  . Hyperlipidemia 03/19/2015  . Tobacco abuse counseling 03/19/2015  . COPD exacerbation ([place]) 10/04/2014  . Anxiety 09/22/2014  . Colon polyp 09/22/2014  . Depression 09/22/2014  . Dyshidrosis 09/22/2014  . GERD (gastroesophageal reflux disease) 09/22/2014  . Insomnia 09/22/2014  . Lung  nodule, multiple 09/22/2014  . Panic disorder 09/22/2014  . Chest pain 01/17/2014  . Palpitations 01/17/2014  . Pulmonary nodule 06/10/2013  . Shortness of breath 05/20/2013  . COPD, GOLD B 05/20/2013  . Tobacco abuse 05/20/2013  . Daytime somnolence 05/20/2013  . Discoid lupus 05/30/2012  . Fibromyalgia 02/11/2012  . PVC's (premature ventricular contractions) 01/29/2011  . Essential (primary) hypertension 03/04/1998    Past Surgical History  Procedure Laterality Date  . Vaginal hysterectomy  1995    vaginal  . Cardiac catheterization  01/03/14  . Image guided sinus surgery N/A 11/30/2014    Procedure: IMAGE GUIDED SINUS SURGERY;  Surgeon: Carloyn Manner, MD;  Location: [place];  Service: ENT;  Laterality: N/A;  GAVE DISK TO CE CE  . Septoplasty N/A 11/30/2014    Procedure: SEPTOPLASTY;  Surgeon: Carloyn Manner, MD;  Location: [place];  Service: ENT;  Laterality: N/A;  . Maxillary antrostomy Bilateral 11/30/2014    Procedure: MAXILLARY ANTROSTOMY;  Surgeon: Carloyn Manner, MD;  Location: [place];  Service: ENT;  Laterality: Bilateral;  . Sphenoidectomy Left 11/30/2014    Procedure: Coralee Pesa, ;  Surgeon: Carloyn Manner, MD;  Location: [place];  Service: ENT;  Laterality: Left;  . Cardiac catheterization      Current Outpatient Rx  Name  Route  Sig  Dispense  Refill  . albuterol (PROVENTIL HFA;VENTOLIN HFA) 108 (90 BASE) MCG/ACT inhaler   Inhalation  Inhale 2 puffs into the lungs every 6 (six) hours as needed for wheezing or shortness of breath.   1 Inhaler   3   . albuterol (PROVENTIL) (2.5 MG/3ML) 0.083% nebulizer solution   Nebulization   Take 3 mLs (2.5 mg total) by nebulization every 4 (four) hours as needed for wheezing or shortness of breath. Reported on 03/19/2015   75 mL   12   . ALPRAZolam (XANAX) 1 MG tablet      TAKE 1 TABLET BY MOUTH EVERY 8 HOURS AS NEEDED   60 tablet   4   . ARIPiprazole (ABILIFY)  5 MG tablet   Oral   Take 5 mg by mouth daily. Reported on 03/19/2015         . aspirin EC 81 MG EC tablet   Oral   Take 1 tablet (81 mg total) by mouth daily.   30 tablet   6   . azithromycin (ZITHROMAX Z-PAK) 250 MG tablet      Take as directed   6 each   0   . budesonide-formoterol (SYMBICORT) 160-4.5 MCG/ACT inhaler   Inhalation   Inhale 1 puff into the lungs. Up to 3 times a day         . clonazePAM (KLONOPIN) 1 MG tablet      TAKE 1 TABLET BY MOUTH TWICE A DAY AS NEEDED   90 tablet   1     Not to exceed 4 additional fills before 08/02/2015 ...   . DULoxetine (CYMBALTA) 60 MG capsule   Oral   Take 60 mg by mouth daily.         . hydroxychloroquine (PLAQUENIL) 200 MG tablet   Oral   Take 400 mg by mouth daily.         . isosorbide mononitrate (IMDUR) 30 MG 24 hr tablet   Oral   Take 1 tablet (30 mg total) by mouth at bedtime.   30 tablet   6   . lansoprazole (PREVACID) 30 MG capsule   Oral   Take 30 mg by mouth 2 (two) times daily before a meal.         . nicotine (NICODERM CQ - DOSED IN MG/24 HOURS) 21 mg/24hr patch   Transdermal   Place 1 patch (21 mg total) onto the skin daily.   28 patch   0   . nicotine polacrilex (NICORETTE) 4 MG gum   Oral   Take 1 each (4 mg total) by mouth as needed for smoking cessation.   100 tablet   0   . nitroGLYCERIN (NITROSTAT) 0.4 MG SL tablet   Sublingual   Place 1 tablet (0.4 mg total) under the tongue every 5 (five) minutes as needed for chest pain.   25 tablet   0   . predniSONE (STERAPRED UNI-PAK 21 TAB) 10 MG (21) TBPK tablet   Oral   Take 1 tablet (10 mg total) by mouth daily. Please take 6 pills first day in the morning, then taper by 1 pill daily until finished   21 tablet   0   . propranolol (INDERAL) 20 MG tablet      TAKE 1 TABLET BY MOUTH 3 TIMES A DAY   90 tablet   12   . Respiratory Therapy Supplies (FLUTTER) DEVI   Does not apply   1 Device by Does not apply route daily.   1  each   0   . tiotropium (SPIRIVA) 18 MCG inhalation capsule  Inhalation   Place 18 mcg into inhaler and inhale daily.         . TraZODone HCl 150 MG TB24   Oral   Take 150 mg by mouth at bedtime as needed. Reported on 03/19/2015           Allergies Alum & mag hydroxide-simeth; Cefdinir; Doxycycline; and Ivp dye  Family History  Problem Relation Age of Onset  . Emphysema Father   . Asthma Father   . Heart disease Father   . Cancer Father     colon  . Heart attack Father   . Arthritis Father     RA  . Hypertension Mother     Social History Social History  Substance Use Topics  . Smoking status: Current Every Day Smoker -- 0.00 packs/day for 16 years    Types: Cigarettes  . Smokeless tobacco: Never Used     Comment: occasional cigarette since quitting last week  . Alcohol Use: No    Review of Systems Constitutional: Positive low-grade fever/chills Eyes: No visual changes. ENT: No sore throat. No stiff neck no neck pain Cardiovascular: Denies chest pain. Respiratory: Cough which is occasionally productive Gastrointestinal:   no vomiting.  No diarrhea.  No constipation. Genitourinary: Negative for dysuria. Musculoskeletal: Negative lower extremity swelling Skin: Negative for rash. Neurological: Negative for headaches, focal weakness or numbness. 10-point ROS otherwise negative.  ____________________________________________   PHYSICAL EXAM:  VITAL SIGNS: ED Triage Vitals  Enc Vitals Group     BP 06/19/15 1935 135/70 mmHg     Pulse Rate 06/19/15 1935 113     Resp 06/19/15 1935 20     Temp 06/19/15 1935 99.1 F (37.3 C)     Temp Source 06/19/15 1935 Oral     SpO2 06/19/15 1935 95 %     Weight 06/19/15 1935 150 lb (68.04 kg)     Height 06/19/15 1935 5\' 4"  (1.626 m)     Head Cir --      Peak Flow --      Pain Score --      Pain Loc --      Pain Edu? --      Excl. in [place]? --     Constitutional: Alert and oriented. Well appearing and in no acute  distress. Eyes: Conjunctivae are normal. PERRL. EOMI. Head: Atraumatic. Nose: Positive congestion/rhinnorhea. Mouth/Throat: Mucous membranes are moist.  Oropharynx non-erythematous. Neck: No stridor.   Nontender with no meningismus Cardiovascular: Normal rate, regular rhythm. Grossly normal heart sounds.  Good peripheral circulation. Respiratory: Normal respiratory effort.  No retractions. Lungs CTAB. Abdominal: Soft and nontender. No distention. No guarding no rebound Back:  There is no focal tenderness or step off there is no midline tenderness there are no lesions noted. there is no CVA tenderness Musculoskeletal: No lower extremity tenderness. No joint effusions, no DVT signs strong distal pulses no edema Neurologic:  Normal speech and language. No gross focal neurologic deficits are appreciated.  Skin:  Skin is warm, dry and intact. No rash noted. Psychiatric: Mood and affect are normal. Speech and behavior are normal.  ____________________________________________   LABS (all labs ordered are listed, but only abnormal results are displayed)  Labs Reviewed  BASIC METABOLIC PANEL - Abnormal; Notable for the following:    Sodium 134 (*)    Glucose, Bld 101 (*)    All other components within normal limits  CBC  TROPONIN I   ____________________________________________  EKG  I personally interpreted any EKGs ordered  by me or triage Slight tachycardia rate 111 bpm no acute ST elevation or acute ST depression nonspecific ST changes ____________________________________________  RADIOLOGY  I reviewed any imaging ordered by me or triage that were performed during my shift and, if possible, patient and/or family made aware of any abnormal findings. ____________________________________________   PROCEDURES  Procedure(s) performed: None  Critical Care performed: None  ____________________________________________   INITIAL IMPRESSION / ASSESSMENT AND PLAN / ED  COURSE  Pertinent labs & imaging results that were available during my care of the patient were reviewed by me and considered in my medical decision making (see chart for details).  Despite symptoms for 24 hours patient has a negative troponin I do not think this represents CAD, there is no evidence of PE clinically, this is all very consistent with COPD. At this time her lungs are actually clear and she is moving good air other she states she just uses an inhaler prior to coming in which I suspect is why her heart rate is slightly elevated. She does not appear to be markedly dehydrated and has only had symptoms for one day. Given her history of COPD I do think antibiotics might not be a bad course of action for her in addition to steroids which were not given. Patient does not wish to take the clindamycin which is not a usual respiratory medication. I think that perhaps what we will do is start her on Zithromax and steroids and reassess. Close outpatient follow-up is been advised smoking cessation has been advised return precautions have been advised ____________________________________________   FINAL CLINICAL IMPRESSION(S) / ED DIAGNOSES  Final diagnoses:  None      This chart was dictated using voice recognition software.  Despite best efforts to proofread,  errors can occur which can change meaning.     Schuyler Amor, MD 06/19/15 2059

## 2015-06-19 NOTE — Discharge Instructions (Signed)

## 2015-06-19 NOTE — ED Notes (Addendum)
Pt states she feels like she is not getting a full breath.  Hx copd, cig smoker   Pt denies chest pain.  Sx began last night.  Pt was seen at urgent care today and dx with sinus infection.  Started on clindamycin .

## 2015-06-21 ENCOUNTER — Encounter: Payer: Self-pay | Admitting: Family Medicine

## 2015-06-21 ENCOUNTER — Ambulatory Visit (INDEPENDENT_AMBULATORY_CARE_PROVIDER_SITE_OTHER): Payer: Medicare Other | Admitting: Family Medicine

## 2015-06-21 VITALS — BP 112/80 | HR 93 | Temp 98.1°F | Resp 16 | Wt 155.0 lb

## 2015-06-21 DIAGNOSIS — J441 Chronic obstructive pulmonary disease with (acute) exacerbation: Secondary | ICD-10-CM

## 2015-06-21 DIAGNOSIS — J0110 Acute frontal sinusitis, unspecified: Secondary | ICD-10-CM | POA: Diagnosis not present

## 2015-06-21 MED ORDER — BUDESONIDE-FORMOTEROL FUMARATE 160-4.5 MCG/ACT IN AERO: 2.0000 | INHALATION_SPRAY | Freq: Two times a day (BID) | RESPIRATORY_TRACT | Status: DC

## 2015-06-21 MED ORDER — LEVOFLOXACIN 500 MG PO TABS: 500.0000 mg | ORAL_TABLET | Freq: Every day | ORAL | Status: DC

## 2015-06-21 NOTE — Progress Notes (Signed)
Patient: Andrea [name] Female    DOB: 05-Aug-1965   50 y.o.   MRN: [id number] Visit Date: 06/21/2015  Today's Provider: Lelon Huh, MD   Chief Complaint  Patient presents with  . Hospitalization Follow-up  . COPD   Subjective:    HPI   Follow up ER visit  Patient was seen in ER for [facility] on 06/19/15. She was treated for COPD with acute bronchitis. Treatment for this included: Zithromax and jprednisone. Advised to stop smoking She reports good compliance with treatment. She reports this condition is slightly Improved.  ----------------------------------------------------------------------   SOB started 06/19/2015. Symptoms of sob, wheezing, cough, ear congestion, sore throat, fever and sinus infection. She has samples Symbicort, but not been using consistently, however albuterol remains effective.   Dg Chest 2 View  06/19/2015  CLINICAL DATA:  Shortness of breath.  Chest pain.  Emphysema. EXAM: CHEST  2 VIEW COMPARISON:  03/18/2015 FINDINGS: Atherosclerotic aortic arch.  Heart size within normal limits. Tapering of the peripheral pulmonary vasculature favors emphysema. Lungs appear otherwise clear. Mild thoracic spondylosis. IMPRESSION: 1. Emphysema. 2. No acute findings. 3. Atherosclerotic aortic arch. Electronically Signed   By: Van Clines M.D.   On: 06/19/2015 20:03     Allergies  Allergen Reactions  . Alum & Mag Hydroxide-Simeth Diarrhea and Nausea Only  . Cefdinir Other (See Comments)    tachycardia  . Doxycycline Other (See Comments)    Nausea, migraine  . Ivp Dye [Iodinated Diagnostic Agents] Palpitations   Previous Medications   ALBUTEROL (PROVENTIL HFA;VENTOLIN HFA) 108 (90 BASE) MCG/ACT INHALER    Inhale 2 puffs into the lungs every 6 (six) hours as needed for wheezing or shortness of breath.   ALBUTEROL (PROVENTIL) (2.5 MG/3ML) 0.083% NEBULIZER SOLUTION    Take 3 mLs (2.5 mg total) by nebulization every 4 (four) hours as needed for wheezing or  shortness of breath. Reported on 03/19/2015   ALPRAZOLAM (XANAX) 1 MG TABLET    TAKE 1 TABLET BY MOUTH EVERY 8 HOURS AS NEEDED   ARIPIPRAZOLE (ABILIFY) 5 MG TABLET    Take 5 mg by mouth daily. Reported on 03/19/2015   ASPIRIN EC 81 MG EC TABLET    Take 1 tablet (81 mg total) by mouth daily.   AZITHROMYCIN (ZITHROMAX Z-PAK) 250 MG TABLET    Take as directed   BUDESONIDE-FORMOTEROL (SYMBICORT) 160-4.5 MCG/ACT INHALER    Inhale 1 puff into the lungs. Up to 3 times a day   CLONAZEPAM (KLONOPIN) 1 MG TABLET    TAKE 1 TABLET BY MOUTH TWICE A DAY AS NEEDED   DULOXETINE (CYMBALTA) 60 MG CAPSULE    Take 60 mg by mouth daily.   HYDROXYCHLOROQUINE (PLAQUENIL) 200 MG TABLET    Take 400 mg by mouth daily.   ISOSORBIDE MONONITRATE (IMDUR) 30 MG 24 HR TABLET    Take 1 tablet (30 mg total) by mouth at bedtime.   LANSOPRAZOLE (PREVACID) 30 MG CAPSULE    Take 30 mg by mouth 2 (two) times daily before a meal.   NICOTINE (NICODERM CQ - DOSED IN MG/24 HOURS) 21 MG/24HR PATCH    Place 1 patch (21 mg total) onto the skin daily.   NICOTINE POLACRILEX (NICORETTE) 4 MG GUM    Take 1 each (4 mg total) by mouth as needed for smoking cessation.   NITROGLYCERIN (NITROSTAT) 0.4 MG SL TABLET    Place 1 tablet (0.4 mg total) under the tongue every 5 (five) minutes as needed  for chest pain.   PREDNISONE (DELTASONE) 20 MG TABLET    Take 1 tablet (20 mg total) by mouth daily.   RESPIRATORY THERAPY SUPPLIES (FLUTTER) DEVI    1 Device by Does not apply route daily.   TIOTROPIUM (SPIRIVA) 18 MCG INHALATION CAPSULE    Place 18 mcg into inhaler and inhale daily.   TRAZODONE HCL 150 MG TB24    Take 150 mg by mouth at bedtime as needed. Reported on 03/19/2015    Review of Systems  Constitutional: Negative for fever, chills, appetite change and fatigue.  HENT: Positive for congestion, postnasal drip and sinus pressure.   Respiratory: Positive for cough, chest tightness, shortness of breath and wheezing.   Cardiovascular: Negative for  chest pain and palpitations.  Gastrointestinal: Negative for nausea, vomiting and abdominal pain.  Neurological: Negative for dizziness and weakness.    Social History  Substance Use Topics  . Smoking status: Current Every Day Smoker -- 0.00 packs/day for 16 years    Types: Cigarettes  . Smokeless tobacco: Never Used     Comment: occasional cigarette since quitting last week  . Alcohol Use: No   Objective:   BP 112/80 mmHg  Pulse 93  Temp(Src) 98.1 F (36.7 C) (Oral)  Resp 16  Wt 155 lb (70.308 kg)  SpO2 97%  Physical Exam  General Appearance:    Alert, cooperative, no distress  HENT:   ENT exam normal, no neck nodes or sinus tenderness, neck without nodes, frontal sinus tender and nasal mucosa pale and congested  Eyes:    PERRL, conjunctiva/corneas clear, EOM's intact       Lungs:     Occasional expiratory wheeze, no rales, , respirations unlabored  Heart:    Regular rate and rhythm  Neurologic:   Awake, alert, oriented x 3. No apparent focal neurological           defect.          Assessment & Plan:     1. Acute frontal sinusitis, recurrence not specified She states that levofloxacin usually works much better for her than Azithromycin, will change antibiotic.  - levofloxacin (LEVAQUIN) 500 MG tablet; Take 1 tablet (500 mg total) by mouth daily.  Dispense: 7 tablet; Refill: 0  2. COPD exacerbation (HCC) Continue prednisone. Get on scheduled doses of symbicort.  - budesonide-formoterol (SYMBICORT) 160-4.5 MCG/ACT inhaler; Inhale 2 puffs into the lungs 2 (two) times daily.  Dispense: 1 Inhaler; Refill: 11 - levofloxacin (LEVAQUIN) 500 MG tablet; Take 1 tablet (500 mg total) by mouth daily.  Dispense: 7 tablet; Refill: 0   Call if symptoms change or if not rapidly improving.     Patient Instructions  Use OTC Mucinex or guaifenesin for chest congestion.          Lelon Huh, MD  [facility]

## 2015-06-21 NOTE — Patient Instructions (Signed)
Use OTC Mucinex or guaifenesin for chest congestion.

## 2015-06-22 ENCOUNTER — Telehealth: Payer: Self-pay | Admitting: Family Medicine

## 2015-06-22 NOTE — Telephone Encounter (Signed)
Spoke with patient. She states that this morning she woke up with burning pain in her wind pipe. She thinks its related to using the Symbicort. Patient has been rinsing her mouth after using Symbicort as stated in the instructions on the packet insert. Patient has put the Symbicort on hold and the burning in her through has improved and nearly resolved. Patient states she now has soreness in her chest when she coughs. Patient still has her Albuterol inhaler to use but she reports so far she has not needed to use it. Patient wants to know if she should be on a different inhaler since the Symbicort is causing her throat to burn?

## 2015-06-22 NOTE — Telephone Encounter (Signed)
Pt stated that she came in 06/21/15 and was started on budesonide-formoterol (SYMBICORT) 160-4.5 MCG/ACT inhaler. Pt stated she woke up this morning and her wind pipe feels like it is burning and when she coughs now it hurts. Pt would like to speak with a nurse. Thanks TNP

## 2015-06-23 NOTE — Telephone Encounter (Signed)
Can change to Spiriva 1 capsule inhaler through device daily, #30 caps rf x 12.

## 2015-06-27 ENCOUNTER — Other Ambulatory Visit: Payer: Self-pay | Admitting: Family Medicine

## 2015-06-27 NOTE — Telephone Encounter (Signed)
Can try change to augmentin. Please double check with patient and make sure she no history of penicillin allergy. May send rx to her pharmacy if not. Thanks.

## 2015-06-27 NOTE — Telephone Encounter (Signed)
Pt stated she will finish levofloxacin (LEVAQUIN) 500 MG tablet today and still isn't feeling better. Pt stated she wanted to see if she should try something else. [facility]. Please advise. Thanks TNP

## 2015-06-28 NOTE — Telephone Encounter (Signed)
Pt called back saying she has taken Augmentin but it causes severe stomach pain.  She said she will try again if you recommend it.  Please let the patient know what you want to do.    Her call back is [phone]  Thanks, Con Memos

## 2015-06-29 NOTE — Telephone Encounter (Signed)
Pt is requesting a call back.  CB#[phone]/MW

## 2015-06-30 MED ORDER — AMOXICILLIN-POT CLAVULANATE 875-125 MG PO TABS: 1.0000 | ORAL_TABLET | Freq: Two times a day (BID) | ORAL | Status: DC

## 2015-06-30 NOTE — Telephone Encounter (Signed)
Please advise message below  °

## 2015-06-30 NOTE — Telephone Encounter (Signed)
Recommend she go ahead and take Augmentin. Can take along with OTC Pro-biotics or eat a few cups of yogurt per day to reduce side effects.

## 2015-06-30 NOTE — Telephone Encounter (Signed)
Patient was notified.

## 2015-07-04 MED ORDER — TIOTROPIUM BROMIDE MONOHYDRATE 18 MCG IN CAPS: 18.0000 ug | ORAL_CAPSULE | Freq: Every day | RESPIRATORY_TRACT | Status: DC

## 2015-07-04 NOTE — Telephone Encounter (Signed)
Called patient to get a update on how she was doing. Patient states since she has been on the Augmentin her breathing has improved although she still has a cough. Patient has not used the Symbicort since last week because she fears it will cause her wind pipe to burn again. Patient would like to try the Spiriva. Prescription sent into pharmacy.

## 2015-07-17 ENCOUNTER — Other Ambulatory Visit: Payer: Self-pay | Admitting: Family Medicine

## 2015-08-12 ENCOUNTER — Other Ambulatory Visit: Payer: Self-pay | Admitting: Family Medicine

## 2015-08-12 DIAGNOSIS — F419 Anxiety disorder, unspecified: Secondary | ICD-10-CM

## 2015-08-13 NOTE — Telephone Encounter (Signed)
Please call in alprazolam.  

## 2015-08-16 ENCOUNTER — Other Ambulatory Visit: Payer: Self-pay | Admitting: Family Medicine

## 2015-08-17 ENCOUNTER — Other Ambulatory Visit: Payer: Self-pay | Admitting: Family Medicine

## 2015-08-17 NOTE — Telephone Encounter (Signed)
Please call in alprazolam.  

## 2015-08-18 NOTE — Telephone Encounter (Signed)
Rx called in to pharmacy. 

## 2015-09-03 ENCOUNTER — Other Ambulatory Visit: Payer: Self-pay | Admitting: Family Medicine

## 2015-09-03 NOTE — Telephone Encounter (Signed)
Please call in clonazepam  

## 2015-09-17 ENCOUNTER — Other Ambulatory Visit: Payer: Self-pay | Admitting: Family Medicine

## 2015-09-17 NOTE — Telephone Encounter (Signed)
Please call in alprazolam.  

## 2015-09-24 DIAGNOSIS — J302 Other seasonal allergic rhinitis: Secondary | ICD-10-CM | POA: Diagnosis not present

## 2015-10-03 ENCOUNTER — Ambulatory Visit: Payer: Medicare Other | Admitting: Internal Medicine

## 2015-10-03 NOTE — Progress Notes (Signed)
Andrea [name]     Assessment and Plan:     Tobacco abuse --Continues to smoke, discussed cessation strategies today.  -I discussed with the patient the best way for her to quit is to stop "cold [place]". I asked her to pick a quit date, and start any potential nicotine replacement therapies. One week before.   COPD, with multiple exacerbations.-Severe COPD, continue bronchodilators.  --She uses nebulized albuterol but not ipratropium; 2 to 3 times per day when she is not feeling well.  -She is given a sample of Anoro inhaler, asked to use once daily. She is also given a prescription for the same. She is asked to call us back if prescription is too expensive and we can try something else. --She stopped Advair because she did not feel that it helped her breathing, she did not develop Symbicort because it was too expensive. Spiriva was not tolerated because it caused a burning sensation in her throat.  Anxiety --Currently klonopin tid, she takes xanax 2 or 3 days per week.  --Uses trazodone to sleep about 1 night per week.   Pulmonary nodule 06/2013 CT high res chest> mild centrilobular emphysema but no ILD, multiple scattered pulm nodules all 58mm in size 07/2014 CT chest > nodule unchanged no further follow up recommended.   Daytime somnolence 05/2013 Sleep study [facility] > AHI 3.8   HPI:   Andrea [name] is a 50 year old Caucasian female with severe COPD, which appears to be complicated by anxiety. She was last seen by our service in the inpatient setting in 10/2015 for a COPD exacerbation. She was admitted in 03/2015 for another AECOPD and had an ER visit in April of 2017 for same. It was noted that she had a lung nodule on her last CT chest  She has quit smoking with [employer] in the past.   She notes that she continues to wheeze at night, which is apparently normal for her. She is currently smoking 1 ppd. She stopped using spiriva because she got a burning sensation after  using it. She is using albuterol MDI twice per day, she uses a neb twice per week. She got prescribed symbicort but it was 300 bucks for a month.  She was tried on advair but it did not help. Her main problem is dyspnea with exertion such as going up stairs at her daughter in laws house. She can  Go to [employer] but will have to pace herself.   Chest x-ray PA and lateral views reviewed from 06/19/14; showed emphysematous changes, otherwise unremarkable with hyperinflated lungs, minimal difference in comparison with previous films.  CT chest from 06/10/2013 was reviewed that showed some scattered emphysematous changes but otherwise unremarkable without evidence of bronchiectasis.  Baseline O2 was 96% and HR 87 at rest on RA. After walking 600 feet sat was 94% and HR 98 on RA. Pt complained of mild dyspnea, but appeared comfortable and was speaking full sentences.   Social Hx:   Social History  Substance Use Topics  . Smoking status: Current Every Day Smoker    Packs/day: 0.00    Years: 16.00    Types: Cigarettes  . Smokeless tobacco: Never Used     Comment: occasional cigarette since quitting last week  . Alcohol use No   Medication:       Allergies:  Alum & mag hydroxide-simeth; Cefdinir; Doxycycline; and Ivp dye [iodinated diagnostic agents]  Review of Systems: Gen:  Denies  fever, sweats, chills HEENT: Denies blurred  vision, double vision, ear pain, eye pain, hearing loss, nose bleeds, sore throat Cvc:  No dizziness, chest pain or heaviness Resp:   Denies cough or sputum porduction, shortness of breath Gi: Denies swallowing difficulty, stomach pain, nausea or vomiting, diarrhea, constipation, bowel incontinence Gu:  Denies bladder incontinence, burning urine Ext:   No Joint pain, stiffness or swelling Skin: No skin rash, easy bruising or bleeding or hives Endoc:  No polyuria, polydipsia , polyphagia or weight change Psych: No depression, insomnia or hallucinations  Other:  All  other systems negative  Physical Examination:   VS: There were no vitals taken for this visit.  General Appearance: No distress  Neuro:without focal findings,  speech normal,  HEENT: PERRLA, EOM intact.   Pulmonary: normal breath sounds, No wheezing, No rales;    CardiovascularNormal S1,S2.  No m/r/g.   Abdomen: Benign, Soft, non-tender. Renal:  No costovertebral tenderness  GU:  No performed at this time. Endoc: No evident thyromegaly Skin:   warm, no rashes, no ecchymosis  Extremities: normal, no cyanosis, clubbing.   Labs results:   Rad results:     Other:      Orders for this visit: No orders of the defined types were placed in this encounter.    Thank  you for the consultation and for allowing [facility] to assist in the care of your patient. Our recommendations are noted above.  Please contact us if we can be of further service.   Marda Stalker, MD.  Board Certified in Internal Medicine, Pulmonary Medicine, [place], and Sleep Medicine.  [facility] Office Number: [phone]  Patricia Pesa, M.D.  Vilinda Boehringer, M.D.  Cheral Marker, M.D

## 2015-10-05 ENCOUNTER — Encounter: Payer: Self-pay | Admitting: Internal Medicine

## 2015-10-05 ENCOUNTER — Ambulatory Visit (INDEPENDENT_AMBULATORY_CARE_PROVIDER_SITE_OTHER): Payer: Medicare Other | Admitting: Internal Medicine

## 2015-10-05 VITALS — BP 120/68 | HR 86 | Ht 64.0 in | Wt 158.0 lb

## 2015-10-05 DIAGNOSIS — J449 Chronic obstructive pulmonary disease, unspecified: Secondary | ICD-10-CM | POA: Diagnosis not present

## 2015-10-05 MED ORDER — UMECLIDINIUM-VILANTEROL 62.5-25 MCG/INH IN AEPB: 1.0000 | INHALATION_SPRAY | Freq: Every day | RESPIRATORY_TRACT | 0 refills | Status: AC

## 2015-10-05 NOTE — Addendum Note (Signed)
Addended by: Maryanna Shape A on: 10/05/2015 10:43 AM   Modules accepted: Orders

## 2015-10-05 NOTE — Patient Instructions (Addendum)
--  Will give prescription for Anoro inhaler once daily.   --You must stop smoking, or your breathing will continue to worsen.   --Alpha-1 antitrypsin screening test.   --PFT before next visit.

## 2015-11-05 ENCOUNTER — Other Ambulatory Visit: Payer: Self-pay | Admitting: Family Medicine

## 2015-11-06 NOTE — Telephone Encounter (Signed)
Please call in clonazepam  

## 2015-11-08 ENCOUNTER — Encounter: Payer: Medicare Other | Admitting: Family Medicine

## 2015-11-18 ENCOUNTER — Emergency Department: Payer: Medicare Other

## 2015-11-18 ENCOUNTER — Emergency Department
Admission: EM | Admit: 2015-11-18 | Discharge: 2015-11-19 | Disposition: A | Discharge status: Home / Self Care | Payer: Medicare Other | Attending: Emergency Medicine | Admitting: Emergency Medicine

## 2015-11-18 ENCOUNTER — Encounter: Payer: Self-pay | Admitting: Emergency Medicine

## 2015-11-18 ENCOUNTER — Other Ambulatory Visit: Payer: Self-pay

## 2015-11-18 DIAGNOSIS — I10 Essential (primary) hypertension: Secondary | ICD-10-CM | POA: Diagnosis not present

## 2015-11-18 DIAGNOSIS — F17210 Nicotine dependence, cigarettes, uncomplicated: Secondary | ICD-10-CM | POA: Insufficient documentation

## 2015-11-18 DIAGNOSIS — J449 Chronic obstructive pulmonary disease, unspecified: Secondary | ICD-10-CM | POA: Insufficient documentation

## 2015-11-18 DIAGNOSIS — R42 Dizziness and giddiness: Secondary | ICD-10-CM | POA: Diagnosis not present

## 2015-11-18 DIAGNOSIS — I2510 Atherosclerotic heart disease of native coronary artery without angina pectoris: Secondary | ICD-10-CM | POA: Insufficient documentation

## 2015-11-18 DIAGNOSIS — R51 Headache: Secondary | ICD-10-CM | POA: Diagnosis not present

## 2015-11-18 LAB — BASIC METABOLIC PANEL
ANION GAP: 7 (ref 5–15)
BUN: 12 mg/dL (ref 6–20)
CALCIUM: 9.3 mg/dL (ref 8.9–10.3)
CO2: 29 mmol/L (ref 22–32)
CREATININE: 0.87 mg/dL (ref 0.44–1.00)
Chloride: 102 mmol/L (ref 101–111)
Glucose, Bld: 107 mg/dL — ABNORMAL HIGH (ref 65–99)
Potassium: 3.8 mmol/L (ref 3.5–5.1)
SODIUM: 138 mmol/L (ref 135–145)

## 2015-11-18 LAB — URINALYSIS COMPLETE WITH MICROSCOPIC (ARMC ONLY)
Bacteria, UA: NONE SEEN
Bilirubin Urine: NEGATIVE
Glucose, UA: NEGATIVE mg/dL
HGB URINE DIPSTICK: NEGATIVE
KETONES UR: NEGATIVE mg/dL
LEUKOCYTES UA: NEGATIVE
NITRITE: NEGATIVE
PROTEIN: NEGATIVE mg/dL
RBC / HPF: NONE SEEN RBC/hpf (ref 0–5)
SPECIFIC GRAVITY, URINE: 1.002 — AB (ref 1.005–1.030)
pH: 6 (ref 5.0–8.0)

## 2015-11-18 LAB — CBC
HCT: 42.9 % (ref 35.0–47.0)
HEMOGLOBIN: 14.8 g/dL (ref 12.0–16.0)
MCH: 27.2 pg (ref 26.0–34.0)
MCHC: 34.4 g/dL (ref 32.0–36.0)
MCV: 79.2 fL — ABNORMAL LOW (ref 80.0–100.0)
PLATELETS: 228 10*3/uL (ref 150–440)
RBC: 5.42 MIL/uL — AB (ref 3.80–5.20)
RDW: 14.6 % — ABNORMAL HIGH (ref 11.5–14.5)
WBC: 8.8 10*3/uL (ref 3.6–11.0)

## 2015-11-18 MED ORDER — MECLIZINE HCL 25 MG PO TABS
25.0000 mg | ORAL_TABLET | Freq: Once | ORAL | Status: AC
  Administered 2015-11-19: 25 mg via ORAL
  Filled 2015-11-18: qty 1

## 2015-11-18 NOTE — ED Triage Notes (Signed)
Patient with complaint of intermittent dizziness times 2 weeks.

## 2015-11-18 NOTE — ED Notes (Addendum)
Pt. States intermittent weakness for the past two weeks.  Pt. States she will also get a HA with weakness.  Pt. States she feels like room is moving while sitting still.  Pt. States no change in diet or medication.  Pt. Husband states pt. Has been under a lot of family stress.

## 2015-11-19 DIAGNOSIS — R42 Dizziness and giddiness: Secondary | ICD-10-CM | POA: Diagnosis not present

## 2015-11-19 MED ORDER — MECLIZINE HCL 25 MG PO TABS: 25.0000 mg | ORAL_TABLET | Freq: Three times a day (TID) | ORAL | 0 refills | Status: DC | PRN

## 2015-11-19 NOTE — ED Notes (Signed)
Pt. Going home with friend 

## 2015-11-19 NOTE — ED Provider Notes (Signed)
Time Seen: Approximately1123pm  I have reviewed the triage notes  Chief Complaint: Dizziness   History of Present Illness: Andrea [name] is a 50 y.o. female who states a week and a half episode of some intermittent dizziness especially with movement. She states it doesn't occur all the time. The dizziness does resolve if she holds still and describes it more as a vertigo type sensation with feeling like objects are moving. Had some mild tingling in her left hand but otherwise no focal neurologic signs or weakness. She denies any persistent headache puts notices occasional sharp pain left side of the head. She denies any rashes or fever. She denies any ear pain or deafness. No photophobia or trouble with speech or swallowing.   Past Medical History:  Diagnosis Date  . Anxiety    panic attacks, chest pain  . Anxiety   . Arrhythmia   . Arthritis    fingers  . CAD in native artery    a. cardiac cath 01/2014: showed minor irregularities, mildly elevated left ventricular end-diastolic pressure and normal ejection fraction  . COPD (chronic obstructive pulmonary disease) ([place])   . Emphysema/COPD ([place])   . GERD (gastroesophageal reflux disease)   . Hypertension   . Lupus ([place])   . PONV (postoperative nausea and vomiting)   . Wears dentures    partial upper and lower    Patient Active Problem List   Diagnosis Date Noted  . Hypotension 03/19/2015  . Dyspnea 03/19/2015  . Hyperlipidemia 03/19/2015  . Tobacco abuse counseling 03/19/2015  . COPD exacerbation ([place]) 10/04/2014  . Anxiety 09/22/2014  . Colon polyp 09/22/2014  . Depression 09/22/2014  . Dyshidrosis 09/22/2014  . GERD (gastroesophageal reflux disease) 09/22/2014  . Insomnia 09/22/2014  . Lung nodule, multiple 09/22/2014  . Panic disorder 09/22/2014  . Chest pain 01/17/2014  . Palpitations 01/17/2014  . Pulmonary nodule 06/10/2013  . Shortness of breath 05/20/2013  . COPD, GOLD B 05/20/2013  . Tobacco abuse  05/20/2013  . Daytime somnolence 05/20/2013  . Discoid lupus 05/30/2012  . Fibromyalgia 02/11/2012  . PVC's (premature ventricular contractions) 01/29/2011  . Essential (primary) hypertension 03/04/1998    Past Surgical History:  Procedure Laterality Date  . CARDIAC CATHETERIZATION  01/03/14  . CARDIAC CATHETERIZATION    . EYE SURGERY    . IMAGE GUIDED SINUS SURGERY N/A 11/30/2014   Procedure: IMAGE GUIDED SINUS SURGERY;  Surgeon: Carloyn Manner, MD;  Location: [facility];  Service: ENT;  Laterality: N/A;  GAVE DISK TO CE CE  . MAXILLARY ANTROSTOMY Bilateral 11/30/2014   Procedure: MAXILLARY ANTROSTOMY;  Surgeon: Carloyn Manner, MD;  Location: [facility];  Service: ENT;  Laterality: Bilateral;  . SEPTOPLASTY N/A 11/30/2014   Procedure: SEPTOPLASTY;  Surgeon: Carloyn Manner, MD;  Location: [facility];  Service: ENT;  Laterality: N/A;  . SPHENOIDECTOMY Left 11/30/2014   Procedure: Coralee Pesa, ;  Surgeon: Carloyn Manner, MD;  Location: [facility];  Service: ENT;  Laterality: Left;  Marland Kitchen VAGINAL HYSTERECTOMY  1995   vaginal    Past Surgical History:  Procedure Laterality Date  . CARDIAC CATHETERIZATION  01/03/14  . CARDIAC CATHETERIZATION    . EYE SURGERY    . IMAGE GUIDED SINUS SURGERY N/A 11/30/2014   Procedure: IMAGE GUIDED SINUS SURGERY;  Surgeon: Carloyn Manner, MD;  Location: [facility];  Service: ENT;  Laterality: N/A;  GAVE DISK TO CE CE  . MAXILLARY ANTROSTOMY Bilateral 11/30/2014   Procedure: MAXILLARY ANTROSTOMY;  Surgeon: Jeannie Fend  Vaught, MD;  Location: [facility];  Service: ENT;  Laterality: Bilateral;  . SEPTOPLASTY N/A 11/30/2014   Procedure: SEPTOPLASTY;  Surgeon: Carloyn Manner, MD;  Location: [facility];  Service: ENT;  Laterality: N/A;  . SPHENOIDECTOMY Left 11/30/2014   Procedure: Coralee Pesa, ;  Surgeon: Carloyn Manner, MD;  Location: [facility];  Service: ENT;  Laterality: Left;  Marland Kitchen  VAGINAL HYSTERECTOMY  1995   vaginal    Current Outpatient Rx  . Order #: [id number] Class: Normal  . Order #: [id number] Class: Normal  . Order #: [id number] Class: Phone In  . Order #: [id number] Class: Historical Med  . Order #: [id number] Class: Normal  . Order #: [id number] Class: Print  . Order #: [id number] Class: Historical Med  . Order #: [id number] Class: Normal  . Order #: [id number] Class: Historical Med  . Order #: [id number] Class: Print  . Order #: [id number] Class: Normal  . Order #: [id number] Class: Normal  . Order #: [id number] Class: Normal  . Order #: [id number] Class: Historical Med    Allergies:  Alum & mag hydroxide-simeth; Cefdinir; Doxycycline; and Ivp dye [iodinated diagnostic agents]  Family History: Family History  Problem Relation Age of Onset  . Emphysema Father   . Asthma Father   . Heart disease Father   . Cancer Father     colon  . Heart attack Father   . Arthritis Father     RA  . Hypertension Mother     Social History: Social History  Substance Use Topics  . Smoking status: Current Every Day Smoker    Packs/day: 1.00    Years: 16.00    Types: Cigarettes  . Smokeless tobacco: Never Used  . Alcohol use No     Review of Systems:   10 point review of systems was performed and was otherwise negative:  Constitutional: No fever Eyes: No visual disturbances. Occasional trouble focusing no visual field deficits ENT: No sore throat, ear pain Cardiac: No chest pain Respiratory: No shortness of breath, wheezing, or stridor Abdomen: No abdominal pain, no vomiting, No diarrhea Endocrine: No weight loss, No night sweats Extremities: No peripheral edema, cyanosis Skin: No rashes, easy bruising Neurologic: No focal weakness, trouble with speech or swollowing Urologic: No dysuria, Hematuria, or urinary frequency   Physical Exam:  ED Triage Vitals  Enc Vitals Group     BP 11/18/15 2139 132/82     Pulse Rate 11/18/15 2139 97     Resp 11/18/15 2139 16      Temp 11/18/15 2139 98 F (36.7 C)     Temp Source 11/18/15 2139 Oral     SpO2 11/18/15 2139 (!) 0 %     Weight 11/18/15 2139 153 lb (69.4 kg)     Height 11/18/15 2139 5\' 3"  (1.6 m)     Head Circumference --      Peak Flow --      Pain Score 11/18/15 2145 8     Pain Loc --      Pain Edu? --      Excl. in [place]? --     General: Awake , Alert , and Oriented times 3; GCS 15 Head: Normal cephalic , atraumatic Eyes: Pupils equal , round, reactive to lightedema with clear sclera. Patient has 4-5 beat lateral nystagmus which is exhaustive with no rotary component especially to the right TMs are negative bilaterally for erythema, exudate or drainage Nose/Throat: No nasal drainage, patent upper airway without erythema or exudate.  Neck: Supple, Full range of motion, No anterior adenopathy or palpable  thyroid masses Lungs: Clear to ascultation without wheezes , rhonchi, or rales Heart: Regular rate, regular rhythm without murmurs , gallops , or rubs Abdomen: Soft, non tender without rebound, guarding , or rigidity; bowel sounds positive and symmetric in all 4 quadrants. No organomegaly .        Extremities: 2 plus symmetric pulses. No edema, clubbing or cyanosis Neurologic: normal ambulation, Motor symmetric without deficits, sensory intact Skin: warm, dry, no rashes   Labs:   All laboratory work was reviewed including any pertinent negatives or positives listed below:  Labs Reviewed  BASIC METABOLIC PANEL - Abnormal; Notable for the following:       Result Value   Glucose, Bld 107 (*)    All other components within normal limits  CBC - Abnormal; Notable for the following:    RBC 5.42 (*)    MCV 79.2 (*)    RDW 14.6 (*)    All other components within normal limits  URINALYSIS COMPLETEWITH MICROSCOPIC ([facility] ONLY) - Abnormal; Notable for the following:    Color, Urine STRAW (*)    APPearance CLEAR (*)    Specific Gravity, Urine 1.002 (*)    Squamous Epithelial / LPF 0-5 (*)    All  other components within normal limits  CBG MONITORING, ED    EKG:  ED ECG REPORT I, Daymon Larsen, the attending physician, personally viewed and interpreted this ECG.  Date: 11/19/2015 EKG Time: 2134 Rate: 93 Rhythm: normal sinus rhythm QRS Axis: normal Intervals: normal ST/T Wave abnormalities: Nonspecific T wave Conduction Disturbances: none Narrative Interpretation: unremarkable    Radiology:  "Ct Head Wo Contrast  Result Date: 11/19/2015 CLINICAL DATA:  Subacute onset of dizziness. Vertigo and headache. Initial encounter. EXAM: CT HEAD WITHOUT CONTRAST TECHNIQUE: Contiguous axial images were obtained from the base of the skull through the vertex without intravenous contrast. COMPARISON:  None. FINDINGS: Brain: No evidence of acute infarction, hemorrhage, hydrocephalus, extra-axial collection or mass lesion/mass effect. The posterior fossa, including the cerebellum, brainstem and fourth ventricle, is within normal limits. The third and lateral ventricles, and basal ganglia are unremarkable in appearance. The cerebral hemispheres are symmetric in appearance, with normal gray-white differentiation. No mass effect or midline shift is seen. Vascular: No hyperdense vessel or unexpected calcification. Skull: There is no evidence of fracture; visualized osseous structures are unremarkable in appearance. Sinuses/Orbits: The visualized portions of the orbits are within normal limits. The paranasal sinuses and mastoid air cells are well-aerated. Other: No significant soft tissue abnormalities are seen. IMPRESSION: Unremarkable noncontrast CT of the head. Electronically Signed   By: Garald Balding M.D.   On: 11/19/2015 00:02  "   * I personally reviewed the radiologic studies   ED Course:  Patient's stay here was uneventful and had some improvement with Antivert by mouth. Patient states less issues with head movement etc. Her dizziness appears to be vertiginous in exhaustive in nature.  Head CT is negative for any intracranial masses and I felt lumbar puncture was not necessary at this time. Patient was advised continue all of her current medications and she also has an ear nose and throat doctor for follow-up. Clinical Course     Assessment:  Peripheral vertigo   Final Clinical Impression:  Final diagnoses:  Vertigo     Plan:  Outpatient " New Prescriptions   MECLIZINE (ANTIVERT) 25 MG TABLET    Take 1 tablet (25 mg total) by mouth 3 (three) times daily as needed for dizziness or nausea.  "  Patient was advised to return immediately if condition worsens. Patient was advised to follow up with their primary care physician or other specialized physicians involved in their outpatient care. The patient and/or family member/power of attorney had laboratory results reviewed at the bedside. All questions and concerns were addressed and appropriate discharge instructions were distributed by the nursing staff.            Daymon Larsen, MD 11/19/15 [phone]

## 2015-11-19 NOTE — Discharge Instructions (Signed)
Please return immediately if condition worsens. Please contact her primary physician or the physician you were given for referral. If you have any specialist physicians involved in her treatment and plan please also contact them. Thank you for using [facility]. ° °

## 2015-11-25 DIAGNOSIS — J019 Acute sinusitis, unspecified: Secondary | ICD-10-CM | POA: Diagnosis not present

## 2015-11-27 ENCOUNTER — Telehealth: Payer: Self-pay | Admitting: Family Medicine

## 2015-11-27 DIAGNOSIS — R42 Dizziness and giddiness: Secondary | ICD-10-CM

## 2015-11-27 NOTE — Telephone Encounter (Signed)
Pt stated she was advised at her ED visit on 11/18/15 she needs to follow up with ENT for Vertigo. Pt is requesting a referral back to Dr. Pryor Ochoa. She was referred to see him in August 2016. Pt was advised that Dr. Caryn Section is out of the office and she would like for another provider to review. Pt stated she would come in for an OV if needed. Pt needs referral b/c of Medicaid. Please advise. Thanks TNP

## 2015-11-27 NOTE — Telephone Encounter (Signed)
Please refer ENT for vertigo. Thanks.

## 2015-11-27 NOTE — Telephone Encounter (Signed)
Is it ok to make referral ?

## 2015-11-29 ENCOUNTER — Ambulatory Visit: Payer: Medicare Other | Admitting: Family Medicine

## 2015-12-02 ENCOUNTER — Emergency Department
Admission: EM | Admit: 2015-12-02 | Discharge: 2015-12-02 | Disposition: A | Discharge status: Home / Self Care | Payer: Medicare Other | Attending: Student | Admitting: Student

## 2015-12-02 ENCOUNTER — Encounter: Payer: Self-pay | Admitting: Emergency Medicine

## 2015-12-02 ENCOUNTER — Emergency Department: Payer: Medicare Other

## 2015-12-02 DIAGNOSIS — Z7982 Long term (current) use of aspirin: Secondary | ICD-10-CM | POA: Diagnosis not present

## 2015-12-02 DIAGNOSIS — J209 Acute bronchitis, unspecified: Secondary | ICD-10-CM | POA: Diagnosis not present

## 2015-12-02 DIAGNOSIS — Z79899 Other long term (current) drug therapy: Secondary | ICD-10-CM | POA: Diagnosis not present

## 2015-12-02 DIAGNOSIS — F17210 Nicotine dependence, cigarettes, uncomplicated: Secondary | ICD-10-CM | POA: Insufficient documentation

## 2015-12-02 DIAGNOSIS — I2510 Atherosclerotic heart disease of native coronary artery without angina pectoris: Secondary | ICD-10-CM | POA: Diagnosis not present

## 2015-12-02 DIAGNOSIS — I10 Essential (primary) hypertension: Secondary | ICD-10-CM | POA: Insufficient documentation

## 2015-12-02 DIAGNOSIS — R05 Cough: Secondary | ICD-10-CM | POA: Diagnosis not present

## 2015-12-02 DIAGNOSIS — J449 Chronic obstructive pulmonary disease, unspecified: Secondary | ICD-10-CM | POA: Insufficient documentation

## 2015-12-02 MED ORDER — GUAIFENESIN-CODEINE 100-10 MG/5ML PO SOLN: 10.0000 mL | Freq: Two times a day (BID) | ORAL | 0 refills | Status: DC

## 2015-12-02 MED ORDER — PREDNISONE 10 MG PO TABS: 50.0000 mg | ORAL_TABLET | Freq: Every day | ORAL | 0 refills | Status: DC

## 2015-12-02 MED ORDER — AZITHROMYCIN 250 MG PO TABS: ORAL_TABLET | ORAL | 0 refills | Status: DC

## 2015-12-02 NOTE — ED Triage Notes (Signed)
Cough x 2 weeks, productive yellow.

## 2015-12-02 NOTE — ED Notes (Signed)
See triage note   States she developed a cough about 2 weeks ago  States cough has been occasionally prod with intermittent fever  No fever on arrival to ed

## 2015-12-02 NOTE — ED Provider Notes (Signed)
[facility] Emergency Department Provider Note   ____________________________________________   First MD Initiated Contact with Patient 12/02/15 1008     (approximate)  I have reviewed the triage vital signs and the nursing notes.   HISTORY  Chief Complaint Cough    HPI Andrea [name] is a 50 y.o. female presents with a two-week history of cough congestion with productive yellow sputum. Patient states that she was seen in urgent care 2 weeks ago placed on Augmentin but still has a cough. Ankle history significant for COPD and emphysema.   Past Medical History:  Diagnosis Date  . Anxiety    panic attacks, chest pain  . Anxiety   . Arrhythmia   . Arthritis    fingers  . CAD in native artery    a. cardiac cath 01/2014: showed minor irregularities, mildly elevated left ventricular end-diastolic pressure and normal ejection fraction  . COPD (chronic obstructive pulmonary disease) ([place])   . Emphysema/COPD ([place])   . GERD (gastroesophageal reflux disease)   . Hypertension   . Lupus ([place])   . PONV (postoperative nausea and vomiting)   . Wears dentures    partial upper and lower    Patient Active Problem List   Diagnosis Date Noted  . Hypotension 03/19/2015  . Dyspnea 03/19/2015  . Hyperlipidemia 03/19/2015  . Tobacco abuse counseling 03/19/2015  . COPD exacerbation ([place]) 10/04/2014  . Anxiety 09/22/2014  . Colon polyp 09/22/2014  . Depression 09/22/2014  . Dyshidrosis 09/22/2014  . GERD (gastroesophageal reflux disease) 09/22/2014  . Insomnia 09/22/2014  . Lung nodule, multiple 09/22/2014  . Panic disorder 09/22/2014  . Chest pain 01/17/2014  . Palpitations 01/17/2014  . Pulmonary nodule 06/10/2013  . Shortness of breath 05/20/2013  . COPD, GOLD B 05/20/2013  . Tobacco abuse 05/20/2013  . Daytime somnolence 05/20/2013  . Discoid lupus 05/30/2012  . Fibromyalgia 02/11/2012  . PVC's (premature ventricular contractions) 01/29/2011  .  Essential (primary) hypertension 03/04/1998    Past Surgical History:  Procedure Laterality Date  . CARDIAC CATHETERIZATION  01/03/14  . CARDIAC CATHETERIZATION    . EYE SURGERY    . IMAGE GUIDED SINUS SURGERY N/A 11/30/2014   Procedure: IMAGE GUIDED SINUS SURGERY;  Surgeon: Carloyn Manner, MD;  Location: [place];  Service: ENT;  Laterality: N/A;  GAVE DISK TO CE CE  . MAXILLARY ANTROSTOMY Bilateral 11/30/2014   Procedure: MAXILLARY ANTROSTOMY;  Surgeon: Carloyn Manner, MD;  Location: [place];  Service: ENT;  Laterality: Bilateral;  . SEPTOPLASTY N/A 11/30/2014   Procedure: SEPTOPLASTY;  Surgeon: Carloyn Manner, MD;  Location: [place];  Service: ENT;  Laterality: N/A;  . SPHENOIDECTOMY Left 11/30/2014   Procedure: Coralee Pesa, ;  Surgeon: Carloyn Manner, MD;  Location: [place];  Service: ENT;  Laterality: Left;  Marland Kitchen VAGINAL HYSTERECTOMY  1995   vaginal    Prior to Admission medications   Medication Sig Start Date End Date Taking? Authorizing Provider  albuterol (PROVENTIL HFA;VENTOLIN HFA) 108 (90 BASE) MCG/ACT inhaler Inhale 2 puffs into the lungs every 6 (six) hours as needed for wheezing or shortness of breath. 11/24/14   Vishal Mungal, MD  albuterol (PROVENTIL) (2.5 MG/3ML) 0.083% nebulizer solution Take 3 mLs (2.5 mg total) by nebulization every 4 (four) hours as needed for wheezing or shortness of breath. Reported on 03/19/2015 03/19/15   Theodoro Grist, MD  ALPRAZolam Duanne Moron) 1 MG tablet TAKE 1 TABLET BY MOUTH EVERY 8 HOURS AS NEEDED 09/17/15   Elenore Rota  Courtney Heys, MD  ARIPiprazole (ABILIFY) 5 MG tablet Take 5 mg by mouth daily. Reported on 03/19/2015    Historical Provider, MD  aspirin EC 81 MG EC tablet Take 1 tablet (81 mg total) by mouth daily. 03/19/15   Theodoro Grist, MD  azithromycin (ZITHROMAX Z-PAK) 250 MG tablet Take 2 tablets (500 mg) on  Day 1,  followed by 1 tablet (250 mg) once daily on Days 2 through 5. 12/02/15   Arlyss Repress, PA-C  clonazePAM (KLONOPIN) 1 MG tablet TAKE 1 TABLET BY MOUTH TWICE A DAY AS NEEDED 11/06/15   Birdie Sons, MD  DULoxetine (CYMBALTA) 60 MG capsule Take 60 mg by mouth daily.    Historical Provider, MD  guaiFENesin-codeine 100-10 MG/5ML syrup Take 10 mLs by mouth 2 times daily at 12 noon and 4 pm. 12/02/15   Arlyss Repress, PA-C  hydroxychloroquine (PLAQUENIL) 200 MG tablet TAKE 2 TABLETS BY MOUTH DAILY 07/18/15   Birdie Sons, MD  lansoprazole (PREVACID) 30 MG capsule Take 30 mg by mouth 2 (two) times daily before a meal.    Historical Provider, MD  nitroGLYCERIN (NITROSTAT) 0.4 MG SL tablet Place 1 tablet (0.4 mg total) under the tongue every 5 (five) minutes as needed for chest pain. 12/08/14   Wellington Hampshire, MD  predniSONE (DELTASONE) 10 MG tablet Take 5 tablets (50 mg total) by mouth daily with breakfast. 12/02/15   Arlyss Repress, PA-C  Respiratory Therapy Supplies (FLUTTER) DEVI 1 Device by Does not apply route daily. 03/19/15   Theodoro Grist, MD  TraZODone HCl 150 MG TB24 Take 150 mg by mouth at bedtime as needed. Reported on 03/19/2015    Historical Provider, MD    Allergies Alum & mag hydroxide-simeth; Cefdinir; Doxycycline; and Ivp dye [iodinated diagnostic agents]  Family History  Problem Relation Age of Onset  . Emphysema Father   . Asthma Father   . Heart disease Father   . Cancer Father     colon  . Heart attack Father   . Arthritis Father     RA  . Hypertension Mother     Social History Social History  Substance Use Topics  . Smoking status: Current Every Day Smoker    Packs/day: 1.00    Years: 16.00    Types: Cigarettes  . Smokeless tobacco: Never Used  . Alcohol use No    Review of Systems Constitutional: No fever/chills ENT: No sore throat. Cardiovascular: Denies chest pain. Respiratory: Occasional shortness of breath positive for decreased breath sounds but no acute rhonchi or wheezing noted. Musculoskeletal: Negative for back pain. Skin:  Negative for rash. Neurological: Negative for headaches, focal weakness or numbness.  10-point ROS otherwise negative.  ____________________________________________   PHYSICAL EXAM:  VITAL SIGNS: ED Triage Vitals  Enc Vitals Group     BP 12/02/15 1002 112/74     Pulse Rate 12/02/15 0959 100     Resp 12/02/15 0959 20     Temp 12/02/15 0959 98 F (36.7 C)     Temp Source 12/02/15 0959 Oral     SpO2 12/02/15 0959 96 %     Weight 12/02/15 1001 151 lb (68.5 kg)     Height 12/02/15 1001 5\' 3"  (1.6 m)     Head Circumference --      Peak Flow --      Pain Score 12/02/15 1002 8     Pain Loc --      Pain Edu? --  Excl. in [place]? --     Constitutional: Alert and oriented. Well appearing and in no acute distress. Head: Atraumatic. Nose: No congestion/rhinnorhea. Mouth/Throat: Mucous membranes are moist.  Oropharynx non-erythematous. Neck: No stridor.   Cardiovascular: Normal rate, regular rhythm. Grossly normal heart sounds.  Good peripheral circulation. Respiratory: Normal respiratory effort.  No retractions. Lungs CTAB.Decreased breath sounds scattered bilaterally no active wheezing noted. Musculoskeletal: No lower extremity tenderness nor edema.  No joint effusions. Neurologic:  Normal speech and language. No gross focal neurologic deficits are appreciated. No gait instability. Skin:  Skin is warm, dry and intact. No rash noted. Psychiatric: Mood and affect are normal. Speech and behavior are normal.  ____________________________________________   LABS (all labs ordered are listed, but only abnormal results are displayed)  Labs Reviewed - No data to display ____________________________________________  EKG   ____________________________________________  RADIOLOGY   ____________________________________________   PROCEDURES  Procedure(s) performed: None  Procedures  Critical Care performed: No  ____________________________________________   INITIAL  IMPRESSION / ASSESSMENT AND PLAN / ED COURSE  Pertinent labs & imaging results that were available during my care of the patient were reviewed by me and considered in my medical decision making (see chart for details).  Acute bronchitis secondary to recurrent COPD. Patient was given a prescription for Zithromax, Robitussin-AC and prednisone 5 days. Patient is to follow-up with her primary care provider or return to the ER with any worsening symptomology.  Clinical Course     ____________________________________________   FINAL CLINICAL IMPRESSION(S) / ED DIAGNOSES  Final diagnoses:  Acute bronchitis, unspecified organism      NEW MEDICATIONS STARTED DURING THIS VISIT:  New Prescriptions   AZITHROMYCIN (ZITHROMAX Z-PAK) 250 MG TABLET    Take 2 tablets (500 mg) on  Day 1,  followed by 1 tablet (250 mg) once daily on Days 2 through 5.   GUAIFENESIN-CODEINE 100-10 MG/5ML SYRUP    Take 10 mLs by mouth 2 times daily at 12 noon and 4 pm.   PREDNISONE (DELTASONE) 10 MG TABLET    Take 5 tablets (50 mg total) by mouth daily with breakfast.     Note:  This document was prepared using Dragon voice recognition software and may include unintentional dictation errors.   Arlyss Repress, PA-C 12/02/15 Buckley Gayle, MD 12/02/15 [phone]

## 2015-12-05 ENCOUNTER — Other Ambulatory Visit: Payer: Self-pay | Admitting: Otolaryngology

## 2015-12-05 DIAGNOSIS — H903 Sensorineural hearing loss, bilateral: Secondary | ICD-10-CM | POA: Diagnosis not present

## 2015-12-05 DIAGNOSIS — R42 Dizziness and giddiness: Secondary | ICD-10-CM | POA: Diagnosis not present

## 2015-12-05 DIAGNOSIS — J320 Chronic maxillary sinusitis: Secondary | ICD-10-CM | POA: Diagnosis not present

## 2015-12-14 ENCOUNTER — Encounter: Payer: Self-pay | Admitting: Family Medicine

## 2015-12-16 ENCOUNTER — Ambulatory Visit
Admission: RE | Admit: 2015-12-16 | Discharge: 2015-12-16 | Disposition: A | Discharge status: Home / Self Care | Payer: Medicare Other | Source: Ambulatory Visit | Attending: Otolaryngology | Admitting: Otolaryngology

## 2015-12-16 DIAGNOSIS — R42 Dizziness and giddiness: Secondary | ICD-10-CM | POA: Insufficient documentation

## 2015-12-16 MED ORDER — GADOBENATE DIMEGLUMINE 529 MG/ML IV SOLN
15.0000 mL | Freq: Once | INTRAVENOUS | Status: AC | PRN
  Administered 2015-12-16: 14 mL via INTRAVENOUS

## 2015-12-20 ENCOUNTER — Other Ambulatory Visit: Payer: Self-pay | Admitting: Family Medicine

## 2015-12-20 NOTE — Telephone Encounter (Signed)
Please call in clonazepam  

## 2015-12-20 NOTE — Telephone Encounter (Signed)
Rx called in to pharmacy. 

## 2015-12-26 DIAGNOSIS — R42 Dizziness and giddiness: Secondary | ICD-10-CM | POA: Diagnosis not present

## 2016-01-04 DIAGNOSIS — R42 Dizziness and giddiness: Secondary | ICD-10-CM | POA: Diagnosis not present

## 2016-01-04 DIAGNOSIS — J320 Chronic maxillary sinusitis: Secondary | ICD-10-CM | POA: Diagnosis not present

## 2016-01-29 ENCOUNTER — Other Ambulatory Visit: Payer: Self-pay | Admitting: Family Medicine

## 2016-01-29 NOTE — Telephone Encounter (Signed)
Please call in alprazolam.  

## 2016-01-30 NOTE — Telephone Encounter (Signed)
Rx called in to pharmacy. 

## 2016-02-08 ENCOUNTER — Ambulatory Visit: Payer: Medicare Other | Admitting: Neurology

## 2016-02-12 ENCOUNTER — Other Ambulatory Visit: Payer: Self-pay

## 2016-02-12 MED ORDER — HYDROXYCHLOROQUINE SULFATE 200 MG PO TABS: 400.0000 mg | ORAL_TABLET | Freq: Every day | ORAL | 3 refills | Status: DC

## 2016-02-12 NOTE — Telephone Encounter (Signed)
Pharmacy requesting 90 day supply on medication. The pharmacy we have listed is correct. Thanks!

## 2016-03-07 ENCOUNTER — Other Ambulatory Visit: Payer: Self-pay | Admitting: Family Medicine

## 2016-03-07 NOTE — Telephone Encounter (Signed)
Please call in alprazolam.  

## 2016-03-08 NOTE — Telephone Encounter (Signed)
Called into [facility] [place]. Renaldo Fiddler, CMA

## 2016-03-19 ENCOUNTER — Other Ambulatory Visit: Payer: Self-pay | Admitting: Family Medicine

## 2016-03-22 NOTE — Telephone Encounter (Signed)
Rx called in to pharmacy. 

## 2016-04-17 ENCOUNTER — Other Ambulatory Visit: Payer: Self-pay | Admitting: *Deleted

## 2016-04-17 ENCOUNTER — Telehealth: Payer: Self-pay | Admitting: Family Medicine

## 2016-04-17 DIAGNOSIS — J449 Chronic obstructive pulmonary disease, unspecified: Secondary | ICD-10-CM

## 2016-04-17 NOTE — Telephone Encounter (Signed)
Called Pt to RE-schedule AWV with [facility] for 2:30pm- knb

## 2016-05-14 ENCOUNTER — Encounter: Payer: Medicare Other | Admitting: Family Medicine

## 2016-05-14 ENCOUNTER — Ambulatory Visit: Payer: Medicare Other

## 2016-05-23 ENCOUNTER — Ambulatory Visit: Payer: Medicare Other | Attending: Internal Medicine

## 2016-05-26 DIAGNOSIS — R05 Cough: Secondary | ICD-10-CM | POA: Diagnosis not present

## 2016-05-26 DIAGNOSIS — J040 Acute laryngitis: Secondary | ICD-10-CM | POA: Diagnosis not present

## 2016-05-27 ENCOUNTER — Ambulatory Visit: Payer: Medicare Other | Admitting: Internal Medicine

## 2016-06-02 ENCOUNTER — Emergency Department
Admission: EM | Admit: 2016-06-02 | Discharge: 2016-06-02 | Disposition: A | Discharge status: Home / Self Care | Payer: Medicare Other | Source: Home / Self Care

## 2016-06-02 ENCOUNTER — Emergency Department: Payer: Medicare Other

## 2016-06-02 DIAGNOSIS — Z79899 Other long term (current) drug therapy: Secondary | ICD-10-CM

## 2016-06-02 DIAGNOSIS — Z91041 Radiographic dye allergy status: Secondary | ICD-10-CM | POA: Diagnosis not present

## 2016-06-02 DIAGNOSIS — F17210 Nicotine dependence, cigarettes, uncomplicated: Secondary | ICD-10-CM | POA: Diagnosis present

## 2016-06-02 DIAGNOSIS — R000 Tachycardia, unspecified: Secondary | ICD-10-CM | POA: Diagnosis present

## 2016-06-02 DIAGNOSIS — Z888 Allergy status to other drugs, medicaments and biological substances status: Secondary | ICD-10-CM | POA: Diagnosis not present

## 2016-06-02 DIAGNOSIS — I10 Essential (primary) hypertension: Secondary | ICD-10-CM | POA: Insufficient documentation

## 2016-06-02 DIAGNOSIS — Z881 Allergy status to other antibiotic agents status: Secondary | ICD-10-CM | POA: Diagnosis not present

## 2016-06-02 DIAGNOSIS — I2510 Atherosclerotic heart disease of native coronary artery without angina pectoris: Secondary | ICD-10-CM | POA: Insufficient documentation

## 2016-06-02 DIAGNOSIS — J209 Acute bronchitis, unspecified: Secondary | ICD-10-CM | POA: Diagnosis not present

## 2016-06-02 DIAGNOSIS — R0902 Hypoxemia: Secondary | ICD-10-CM | POA: Diagnosis present

## 2016-06-02 DIAGNOSIS — J449 Chronic obstructive pulmonary disease, unspecified: Secondary | ICD-10-CM | POA: Insufficient documentation

## 2016-06-02 DIAGNOSIS — Z825 Family history of asthma and other chronic lower respiratory diseases: Secondary | ICD-10-CM | POA: Diagnosis not present

## 2016-06-02 DIAGNOSIS — F329 Major depressive disorder, single episode, unspecified: Secondary | ICD-10-CM | POA: Diagnosis present

## 2016-06-02 DIAGNOSIS — Z5321 Procedure and treatment not carried out due to patient leaving prior to being seen by health care provider: Secondary | ICD-10-CM | POA: Insufficient documentation

## 2016-06-02 DIAGNOSIS — M359 Systemic involvement of connective tissue, unspecified: Secondary | ICD-10-CM | POA: Diagnosis present

## 2016-06-02 DIAGNOSIS — F410 Panic disorder [episodic paroxysmal anxiety] without agoraphobia: Secondary | ICD-10-CM | POA: Diagnosis present

## 2016-06-02 DIAGNOSIS — R0602 Shortness of breath: Secondary | ICD-10-CM | POA: Diagnosis not present

## 2016-06-02 DIAGNOSIS — R0789 Other chest pain: Secondary | ICD-10-CM | POA: Diagnosis not present

## 2016-06-02 DIAGNOSIS — J440 Chronic obstructive pulmonary disease with acute lower respiratory infection: Secondary | ICD-10-CM | POA: Diagnosis present

## 2016-06-02 DIAGNOSIS — R05 Cough: Secondary | ICD-10-CM | POA: Diagnosis not present

## 2016-06-02 DIAGNOSIS — J441 Chronic obstructive pulmonary disease with (acute) exacerbation: Secondary | ICD-10-CM | POA: Diagnosis not present

## 2016-06-02 DIAGNOSIS — K210 Gastro-esophageal reflux disease with esophagitis: Secondary | ICD-10-CM | POA: Diagnosis present

## 2016-06-02 DIAGNOSIS — R0600 Dyspnea, unspecified: Secondary | ICD-10-CM | POA: Diagnosis not present

## 2016-06-02 LAB — BASIC METABOLIC PANEL
Anion gap: 7 (ref 5–15)
BUN: 15 mg/dL (ref 6–20)
CHLORIDE: 103 mmol/L (ref 101–111)
CO2: 29 mmol/L (ref 22–32)
Calcium: 9.1 mg/dL (ref 8.9–10.3)
Creatinine, Ser: 0.72 mg/dL (ref 0.44–1.00)
GFR calc Af Amer: >60 mL/min (ref >60)
GFR calc non Af Amer: >60 mL/min (ref >60)
GLUCOSE: 150 mg/dL — AB (ref 65–99)
POTASSIUM: 4 mmol/L (ref 3.5–5.1)
Sodium: 139 mmol/L (ref 135–145)

## 2016-06-02 LAB — CBC
HEMATOCRIT: 42.5 % (ref 35.0–47.0)
Hemoglobin: 14.4 g/dL (ref 12.0–16.0)
MCH: 27.9 pg (ref 26.0–34.0)
MCHC: 33.8 g/dL (ref 32.0–36.0)
MCV: 82.3 fL (ref 80.0–100.0)
Platelets: 230 10*3/uL (ref 150–440)
RBC: 5.17 MIL/uL (ref 3.80–5.20)
RDW: 14.9 % — ABNORMAL HIGH (ref 11.5–14.5)
WBC: 9.1 10*3/uL (ref 3.6–11.0)

## 2016-06-02 MED ORDER — ALBUTEROL SULFATE (2.5 MG/3ML) 0.083% IN NEBU: 5.0000 mg | INHALATION_SOLUTION | Freq: Once | RESPIRATORY_TRACT | Status: DC

## 2016-06-02 NOTE — ED Triage Notes (Signed)
Pt ambulatory to triage with no difficulty. Pt reports started on Friday with a feeling of her heart racing and shortness of breath. Pt reports hx of COPD and states this is different from her normal COPD flares. Pt also reports productive cough with brownish looking phlegm.  Pt talking in full and complete sentences with no difficulty at this time.

## 2016-06-03 ENCOUNTER — Encounter: Payer: Self-pay | Admitting: Family Medicine

## 2016-06-03 ENCOUNTER — Ambulatory Visit (INDEPENDENT_AMBULATORY_CARE_PROVIDER_SITE_OTHER): Payer: Medicare Other | Admitting: Family Medicine

## 2016-06-03 VITALS — BP 130/80 | HR 92 | Temp 97.6°F | Resp 17 | Wt 165.4 lb

## 2016-06-03 DIAGNOSIS — K2100 Gastro-esophageal reflux disease with esophagitis, without bleeding: Secondary | ICD-10-CM

## 2016-06-03 MED ORDER — SUCRALFATE 1 G PO TABS: 1.0000 g | ORAL_TABLET | Freq: Three times a day (TID) | ORAL | 0 refills | Status: DC

## 2016-06-03 NOTE — Patient Instructions (Addendum)
We will call you with the referral to Dr. Allen Norris. Continue to use Prevacid twice daily and TUMS as needed for heartburn/reflux.

## 2016-06-03 NOTE — Progress Notes (Signed)
Subjective:     Patient ID: Andrea [name], female   DOB: Dec 03, 1965, 51 y.o.   MRN: [id number]  HPI  Chief Complaint  Patient presents with  . Cough    Patient comes in office today with complaints of cough for the past 24hrs. Patient states " it feels like squeezing on one side of my chest (left), like spasms and Im unable to catch my breath," patient also reports sore throat for the past 2 weeks. Patient went to [facility] ER on 06/02/16 to address problems but left before being seen because of wait time.   Lab work, EKG, and CXR @ ER ok.Cardiac cath in 2015 without significant CAD. Reports increased heartburn and reflux sx over the last two weeks and has been taking TUMS along with Prevacid twice (on one occasion 3 times ) daily. States discomfort is from the upper left side of her chest up the midline towards her neck.   Review of Systems     Objective:   Physical Exam  Constitutional: She appears well-developed and well-nourished. No distress.  Abdominal: Soft. There is no tenderness.  Psychiatric:  anxious       Assessment:    1. Gastroesophageal reflux disease with esophagitis - sucralfate (CARAFATE) 1 g tablet; Take 1 tablet (1 g total) by mouth 4 (four) times daily -  with meals and at bedtime.  Dispense: 28 tablet; Refill: 0 - Ambulatory referral to Gastroenterology    Plan:    Continue with Prevacid and TUMS as needed.

## 2016-06-05 ENCOUNTER — Encounter: Payer: Self-pay | Admitting: *Deleted

## 2016-06-05 ENCOUNTER — Inpatient Hospital Stay
Admission: EM | Admit: 2016-06-05 | Discharge: 2016-06-06 | DRG: 192 | Disposition: A | Discharge status: Home / Self Care | Payer: Medicare Other | Attending: Internal Medicine | Admitting: Internal Medicine

## 2016-06-05 ENCOUNTER — Emergency Department: Payer: Medicare Other

## 2016-06-05 DIAGNOSIS — K210 Gastro-esophageal reflux disease with esophagitis: Secondary | ICD-10-CM | POA: Diagnosis present

## 2016-06-05 DIAGNOSIS — Z888 Allergy status to other drugs, medicaments and biological substances status: Secondary | ICD-10-CM | POA: Diagnosis not present

## 2016-06-05 DIAGNOSIS — J441 Chronic obstructive pulmonary disease with (acute) exacerbation: Secondary | ICD-10-CM | POA: Diagnosis not present

## 2016-06-05 DIAGNOSIS — F410 Panic disorder [episodic paroxysmal anxiety] without agoraphobia: Secondary | ICD-10-CM | POA: Diagnosis present

## 2016-06-05 DIAGNOSIS — Z720 Tobacco use: Secondary | ICD-10-CM | POA: Diagnosis not present

## 2016-06-05 DIAGNOSIS — J449 Chronic obstructive pulmonary disease, unspecified: Secondary | ICD-10-CM | POA: Diagnosis present

## 2016-06-05 DIAGNOSIS — F329 Major depressive disorder, single episode, unspecified: Secondary | ICD-10-CM | POA: Diagnosis present

## 2016-06-05 DIAGNOSIS — J440 Chronic obstructive pulmonary disease with acute lower respiratory infection: Secondary | ICD-10-CM | POA: Diagnosis present

## 2016-06-05 DIAGNOSIS — L930 Discoid lupus erythematosus: Secondary | ICD-10-CM | POA: Diagnosis not present

## 2016-06-05 DIAGNOSIS — Z79899 Other long term (current) drug therapy: Secondary | ICD-10-CM | POA: Diagnosis not present

## 2016-06-05 DIAGNOSIS — R0789 Other chest pain: Secondary | ICD-10-CM | POA: Diagnosis not present

## 2016-06-05 DIAGNOSIS — R0602 Shortness of breath: Secondary | ICD-10-CM | POA: Diagnosis not present

## 2016-06-05 DIAGNOSIS — R079 Chest pain, unspecified: Secondary | ICD-10-CM

## 2016-06-05 DIAGNOSIS — F17210 Nicotine dependence, cigarettes, uncomplicated: Secondary | ICD-10-CM | POA: Diagnosis not present

## 2016-06-05 DIAGNOSIS — Z881 Allergy status to other antibiotic agents status: Secondary | ICD-10-CM | POA: Diagnosis not present

## 2016-06-05 DIAGNOSIS — R0902 Hypoxemia: Secondary | ICD-10-CM | POA: Diagnosis present

## 2016-06-05 DIAGNOSIS — Z91041 Radiographic dye allergy status: Secondary | ICD-10-CM | POA: Diagnosis not present

## 2016-06-05 DIAGNOSIS — F419 Anxiety disorder, unspecified: Secondary | ICD-10-CM | POA: Diagnosis not present

## 2016-06-05 DIAGNOSIS — J209 Acute bronchitis, unspecified: Secondary | ICD-10-CM | POA: Diagnosis not present

## 2016-06-05 DIAGNOSIS — R000 Tachycardia, unspecified: Secondary | ICD-10-CM | POA: Diagnosis present

## 2016-06-05 DIAGNOSIS — M359 Systemic involvement of connective tissue, unspecified: Secondary | ICD-10-CM | POA: Diagnosis present

## 2016-06-05 DIAGNOSIS — Z825 Family history of asthma and other chronic lower respiratory diseases: Secondary | ICD-10-CM | POA: Diagnosis not present

## 2016-06-05 DIAGNOSIS — R05 Cough: Secondary | ICD-10-CM | POA: Diagnosis not present

## 2016-06-05 DIAGNOSIS — I10 Essential (primary) hypertension: Secondary | ICD-10-CM | POA: Diagnosis present

## 2016-06-05 LAB — TROPONIN I
Troponin I: <0.03 ng/mL (ref <0.03)
Troponin I: <0.03 ng/mL (ref <0.03)

## 2016-06-05 LAB — BASIC METABOLIC PANEL
ANION GAP: 8 (ref 5–15)
BUN: 10 mg/dL (ref 6–20)
CHLORIDE: 100 mmol/L — AB (ref 101–111)
CO2: 29 mmol/L (ref 22–32)
Calcium: 9.5 mg/dL (ref 8.9–10.3)
Creatinine, Ser: 0.84 mg/dL (ref 0.44–1.00)
GFR calc non Af Amer: >60 mL/min (ref >60)
Glucose, Bld: 105 mg/dL — ABNORMAL HIGH (ref 65–99)
Potassium: 4 mmol/L (ref 3.5–5.1)
Sodium: 137 mmol/L (ref 135–145)

## 2016-06-05 LAB — CBC
HCT: 42.6 % (ref 35.0–47.0)
HEMOGLOBIN: 14.6 g/dL (ref 12.0–16.0)
MCH: 27.8 pg (ref 26.0–34.0)
MCHC: 34.4 g/dL (ref 32.0–36.0)
MCV: 80.7 fL (ref 80.0–100.0)
Platelets: 245 10*3/uL (ref 150–440)
RBC: 5.28 MIL/uL — AB (ref 3.80–5.20)
RDW: 14.7 % — ABNORMAL HIGH (ref 11.5–14.5)
WBC: 10.3 10*3/uL (ref 3.6–11.0)

## 2016-06-05 LAB — FIBRIN DERIVATIVES D-DIMER (ARMC ONLY): FIBRIN DERIVATIVES D-DIMER (ARMC): 507.05 — AB (ref 0.00–499.00)

## 2016-06-05 LAB — MAGNESIUM: MAGNESIUM: 2 mg/dL (ref 1.7–2.4)

## 2016-06-05 MED ORDER — IPRATROPIUM-ALBUTEROL 0.5-2.5 (3) MG/3ML IN SOLN
3.0000 mL | Freq: Once | RESPIRATORY_TRACT | Status: AC
  Administered 2016-06-05: 3 mL via RESPIRATORY_TRACT
  Filled 2016-06-05: qty 3

## 2016-06-05 MED ORDER — SODIUM CHLORIDE 0.9 % IV BOLUS (SEPSIS)
1000.0000 mL | Freq: Once | INTRAVENOUS | Status: AC
  Administered 2016-06-05: 1000 mL via INTRAVENOUS

## 2016-06-05 MED ORDER — MAGNESIUM SULFATE 2 GM/50ML IV SOLN
2.0000 g | Freq: Once | INTRAVENOUS | Status: AC
  Administered 2016-06-05: 2 g via INTRAVENOUS
  Filled 2016-06-05: qty 50

## 2016-06-05 MED ORDER — PREDNISONE 20 MG PO TABS: 60.0000 mg | ORAL_TABLET | Freq: Every day | ORAL | 0 refills | Status: DC

## 2016-06-05 MED ORDER — SUCRALFATE 1 G PO TABS
1.0000 g | ORAL_TABLET | Freq: Three times a day (TID) | ORAL | Status: DC
  Administered 2016-06-05 – 2016-06-06 (×2): 1 g via ORAL
  Filled 2016-06-05 (×2): qty 1

## 2016-06-05 MED ORDER — IPRATROPIUM-ALBUTEROL 0.5-2.5 (3) MG/3ML IN SOLN
3.0000 mL | RESPIRATORY_TRACT | Status: DC
  Administered 2016-06-06 (×4): 3 mL via RESPIRATORY_TRACT
  Filled 2016-06-05 (×5): qty 3

## 2016-06-05 MED ORDER — ACETAMINOPHEN 325 MG PO TABS
650.0000 mg | ORAL_TABLET | Freq: Four times a day (QID) | ORAL | Status: DC | PRN
  Administered 2016-06-06: 650 mg via ORAL

## 2016-06-05 MED ORDER — ONDANSETRON HCL 4 MG/2ML IJ SOLN: 4.0000 mg | Freq: Four times a day (QID) | INTRAMUSCULAR | Status: DC | PRN

## 2016-06-05 MED ORDER — PANTOPRAZOLE SODIUM 40 MG PO TBEC
40.0000 mg | DELAYED_RELEASE_TABLET | Freq: Two times a day (BID) | ORAL | Status: DC
  Administered 2016-06-05 – 2016-06-06 (×2): 40 mg via ORAL
  Filled 2016-06-05 (×2): qty 1

## 2016-06-05 MED ORDER — ALBUTEROL SULFATE (2.5 MG/3ML) 0.083% IN NEBU: 5.0000 mg | INHALATION_SOLUTION | Freq: Once | RESPIRATORY_TRACT | Status: DC

## 2016-06-05 MED ORDER — ALBUTEROL SULFATE (2.5 MG/3ML) 0.083% IN NEBU
INHALATION_SOLUTION | RESPIRATORY_TRACT | Status: AC
  Filled 2016-06-05: qty 6

## 2016-06-05 MED ORDER — TRAZODONE HCL 50 MG PO TABS
150.0000 mg | ORAL_TABLET | Freq: Every evening | ORAL | Status: DC | PRN
  Filled 2016-06-05: qty 3

## 2016-06-05 MED ORDER — HYDROXYCHLOROQUINE SULFATE 200 MG PO TABS
400.0000 mg | ORAL_TABLET | Freq: Every day | ORAL | Status: DC
  Administered 2016-06-05: 400 mg via ORAL
  Filled 2016-06-05 (×2): qty 2

## 2016-06-05 MED ORDER — ALBUTEROL SULFATE (2.5 MG/3ML) 0.083% IN NEBU
10.0000 mg | INHALATION_SOLUTION | Freq: Once | RESPIRATORY_TRACT | Status: AC
  Administered 2016-06-05: 10 mg via RESPIRATORY_TRACT

## 2016-06-05 MED ORDER — ARIPIPRAZOLE 5 MG PO TABS
5.0000 mg | ORAL_TABLET | Freq: Every day | ORAL | Status: DC
  Filled 2016-06-05: qty 1

## 2016-06-05 MED ORDER — ALPRAZOLAM 0.5 MG PO TABS
1.0000 mg | ORAL_TABLET | Freq: Three times a day (TID) | ORAL | Status: DC | PRN
  Filled 2016-06-05: qty 2

## 2016-06-05 MED ORDER — METHYLPREDNISOLONE SODIUM SUCC 125 MG IJ SOLR
60.0000 mg | Freq: Three times a day (TID) | INTRAMUSCULAR | Status: DC
  Administered 2016-06-05 – 2016-06-06 (×2): 60 mg via INTRAVENOUS
  Filled 2016-06-05 (×2): qty 2

## 2016-06-05 MED ORDER — LORATADINE 10 MG PO TABS
10.0000 mg | ORAL_TABLET | Freq: Every day | ORAL | Status: DC
Start: 2016-06-05 — End: 2016-06-06
  Filled 2016-06-05 (×2): qty 1

## 2016-06-05 MED ORDER — METHYLPREDNISOLONE SODIUM SUCC 125 MG IJ SOLR
125.0000 mg | Freq: Once | INTRAMUSCULAR | Status: AC
  Administered 2016-06-05: 125 mg via INTRAVENOUS
  Filled 2016-06-05 (×2): qty 2

## 2016-06-05 MED ORDER — ALBUTEROL SULFATE (2.5 MG/3ML) 0.083% IN NEBU
INHALATION_SOLUTION | RESPIRATORY_TRACT | Status: AC
Start: 2016-06-05 — End: 2016-06-05
  Administered 2016-06-05: 10 mg via RESPIRATORY_TRACT
  Filled 2016-06-05: qty 6

## 2016-06-05 MED ORDER — ONDANSETRON HCL 4 MG PO TABS: 4.0000 mg | ORAL_TABLET | Freq: Four times a day (QID) | ORAL | Status: DC | PRN

## 2016-06-05 MED ORDER — CLONAZEPAM 0.5 MG PO TABS
1.0000 mg | ORAL_TABLET | Freq: Two times a day (BID) | ORAL | Status: DC
  Administered 2016-06-05 – 2016-06-06 (×2): 1 mg via ORAL
  Filled 2016-06-05 (×2): qty 2

## 2016-06-05 MED ORDER — NICOTINE 21 MG/24HR TD PT24
21.0000 mg | MEDICATED_PATCH | Freq: Every day | TRANSDERMAL | Status: DC
  Administered 2016-06-05: 21 mg via TRANSDERMAL
  Filled 2016-06-05 (×2): qty 1

## 2016-06-05 MED ORDER — ACETAMINOPHEN 650 MG RE SUPP: 650.0000 mg | Freq: Four times a day (QID) | RECTAL | Status: DC | PRN

## 2016-06-05 MED ORDER — LEVOFLOXACIN 500 MG PO TABS
500.0000 mg | ORAL_TABLET | Freq: Every day | ORAL | Status: DC
Start: 2016-06-05 — End: 2016-06-06
  Administered 2016-06-05 – 2016-06-06 (×2): 500 mg via ORAL
  Filled 2016-06-05 (×2): qty 1

## 2016-06-05 MED ORDER — GUAIFENESIN ER 600 MG PO TB12
600.0000 mg | ORAL_TABLET | Freq: Two times a day (BID) | ORAL | Status: DC
  Administered 2016-06-05 – 2016-06-06 (×2): 600 mg via ORAL
  Filled 2016-06-05 (×2): qty 1

## 2016-06-05 MED ORDER — ENOXAPARIN SODIUM 40 MG/0.4ML ~~LOC~~ SOLN
40.0000 mg | SUBCUTANEOUS | Status: DC
  Filled 2016-06-05: qty 0.4

## 2016-06-05 NOTE — ED Notes (Signed)
Patient choosing to leave against medical advice in regards to her elevated d-dimer and needing further scans.  MD aware.  Patient signed [facility] paperwork electronically.

## 2016-06-05 NOTE — ED Provider Notes (Signed)
[facility] Emergency Department Provider Note  ____________________________________________  Time seen: Approximately 4:12 PM  I have reviewed the triage vital signs and the nursing notes.   HISTORY  Chief Complaint Shortness of Breath   HPI Andrea [name] is a 51 y.o. female with h/o COPD, active smoker, HTN, native CAD, anxiety and lupus who presents for evaluation of shortness of breath. Patient has had 5 days of the cough productive of yellow sputum, shortness of breath that is worse with exertion or laying down, palpitations, and chest tightness. She reports that she feels very tight on the left side of her chest and that tightness has been constant for 5 days. She denies chest pain, hemoptysis, pleuritic pain, personal or family history blood clots, recent or immobilization, leg pain or swelling. She reports that she has been propping herself up on 3 pillows to sleep at night because she feels anxious and is afraid she will die in her sleep unable to breathe. She is able to lay flat and denies felling  She has had no fever or chills. She has been using her inhalers occasionally at home with mild relief. She continues to smoke. She is not on any oxygen.  Past Medical History:  Diagnosis Date  . Anxiety    panic attacks, chest pain  . Anxiety   . Arrhythmia   . Arthritis    fingers  . CAD in native artery    a. cardiac cath 01/2014: showed minor irregularities, mildly elevated left ventricular end-diastolic pressure and normal ejection fraction  . COPD (chronic obstructive pulmonary disease) ([place])   . Emphysema/COPD ([place])   . GERD (gastroesophageal reflux disease)   . Hypertension   . Lupus   . PONV (postoperative nausea and vomiting)   . Wears dentures    partial upper and lower    Patient Active Problem List   Diagnosis Date Noted  . Hypotension 03/19/2015  . Dyspnea 03/19/2015  . Hyperlipidemia 03/19/2015  . Tobacco abuse counseling  03/19/2015  . COPD exacerbation ([place]) 10/04/2014  . Anxiety 09/22/2014  . Colon polyp 09/22/2014  . Depression 09/22/2014  . Dyshidrosis 09/22/2014  . GERD (gastroesophageal reflux disease) 09/22/2014  . Insomnia 09/22/2014  . Lung nodule, multiple 09/22/2014  . Panic disorder 09/22/2014  . Chest pain 01/17/2014  . Palpitations 01/17/2014  . Pulmonary nodule 06/10/2013  . Shortness of breath 05/20/2013  . COPD, GOLD B 05/20/2013  . Tobacco abuse 05/20/2013  . Daytime somnolence 05/20/2013  . Discoid lupus 05/30/2012  . Fibromyalgia 02/11/2012  . PVC's (premature ventricular contractions) 01/29/2011  . Essential (primary) hypertension 03/04/1998    Past Surgical History:  Procedure Laterality Date  . CARDIAC CATHETERIZATION  01/03/14  . CARDIAC CATHETERIZATION    . EYE SURGERY    . IMAGE GUIDED SINUS SURGERY N/A 11/30/2014   Procedure: IMAGE GUIDED SINUS SURGERY;  Surgeon: Carloyn Manner, MD;  Location: [place];  Service: ENT;  Laterality: N/A;  GAVE DISK TO CE CE  . MAXILLARY ANTROSTOMY Bilateral 11/30/2014   Procedure: MAXILLARY ANTROSTOMY;  Surgeon: Carloyn Manner, MD;  Location: [place];  Service: ENT;  Laterality: Bilateral;  . SEPTOPLASTY N/A 11/30/2014   Procedure: SEPTOPLASTY;  Surgeon: Carloyn Manner, MD;  Location: [place];  Service: ENT;  Laterality: N/A;  . SPHENOIDECTOMY Left 11/30/2014   Procedure: Coralee Pesa, ;  Surgeon: Carloyn Manner, MD;  Location: [place];  Service: ENT;  Laterality: Left;  Marland Kitchen [place]  vaginal    Prior to Admission medications   Medication Sig Start Date End Date Taking? Authorizing Provider  albuterol (PROVENTIL HFA;VENTOLIN HFA) 108 (90 BASE) MCG/ACT inhaler Inhale 2 puffs into the lungs every 6 (six) hours as needed for wheezing or shortness of breath. 11/24/14  Yes Vishal Mungal, MD  albuterol (PROVENTIL) (2.5 MG/3ML) 0.083% nebulizer solution Take 3 mLs (2.5 mg  total) by nebulization every 4 (four) hours as needed for wheezing or shortness of breath. Reported on 03/19/2015 03/19/15  Yes Theodoro Grist, MD  ALPRAZolam Duanne Moron) 1 MG tablet TAKE 1 TABLET BY MOUTH EVERY 8 HOURS AS NEEDED 03/07/16  Yes Birdie Sons, MD  clonazePAM (KLONOPIN) 1 MG tablet TAKE 1 TABLET BY MOUTH TWICE A DAY AS NEEDED Patient taking differently: TAKE 1 TABLET BY MOUTH TWICE A DAY 03/22/16  Yes Birdie Sons, MD  hydroxychloroquine (PLAQUENIL) 200 MG tablet Take 2 tablets (400 mg total) by mouth daily. 02/12/16  Yes Birdie Sons, MD  lansoprazole (PREVACID) 30 MG capsule Take 30 mg by mouth 2 (two) times daily before a meal.   Yes Historical Provider, MD  nitroGLYCERIN (NITROSTAT) 0.4 MG SL tablet Place 1 tablet (0.4 mg total) under the tongue every 5 (five) minutes as needed for chest pain. 12/08/14  Yes Wellington Hampshire, MD  sucralfate (CARAFATE) 1 g tablet Take 1 tablet (1 g total) by mouth 4 (four) times daily -  with meals and at bedtime. 06/03/16  Yes Carmon Ginsberg, PA  TraZODone HCl 150 MG TB24 Take 150 mg by mouth at bedtime as needed. Reported on 03/19/2015   Yes Historical Provider, MD  ARIPiprazole (ABILIFY) 5 MG tablet Take 5 mg by mouth daily. Reported on 03/19/2015    Historical Provider, MD  predniSONE (DELTASONE) 20 MG tablet Take 3 tablets (60 mg total) by mouth daily. 06/05/16 06/09/16  Rudene Re, MD  Respiratory Therapy Supplies (FLUTTER) DEVI 1 Device by Does not apply route daily. 03/19/15   Theodoro Grist, MD    Allergies Alum & mag hydroxide-simeth; Cefdinir; Doxycycline; and Ivp dye [iodinated diagnostic agents]  Family History  Problem Relation Age of Onset  . Emphysema Father   . Asthma Father   . Heart disease Father   . Cancer Father     colon  . Heart attack Father   . Arthritis Father     RA  . Hypertension Mother     Social History Social History  Substance Use Topics  . Smoking status: Current Every Day Smoker    Packs/day: 1.00     Years: 16.00    Types: Cigarettes  . Smokeless tobacco: Never Used  . Alcohol use No    Review of Systems  Constitutional: Negative for fever. Eyes: Negative for visual changes. ENT: Negative for sore throat. Neck: No neck pain  Cardiovascular: Negative for chest pain. Respiratory: + shortness of breath and productive cough Gastrointestinal: Negative for abdominal pain, vomiting or diarrhea. Genitourinary: Negative for dysuria. Musculoskeletal: Negative for back pain. Skin: Negative for rash. Neurological: Negative for headaches, weakness or numbness. Psych: No SI or HI  ____________________________________________   PHYSICAL EXAM:  VITAL SIGNS: ED Triage Vitals  Enc Vitals Group     BP 06/05/16 1540 (!) 133/51     Pulse Rate 06/05/16 1540 (!) 101     Resp 06/05/16 1540 18     Temp 06/05/16 1540 98.2 F (36.8 C)     Temp Source 06/05/16 1540 Oral     SpO2 06/05/16  1540 98 %     Weight --      Height --      Head Circumference --      Peak Flow --      Pain Score 06/05/16 1608 9     Pain Loc --      Pain Edu? --      Excl. in [place]? --     Constitutional: Alert and oriented. Well appearing and in no apparent distress. HEENT:      Head: Normocephalic and atraumatic.         Eyes: Conjunctivae are normal. Sclera is non-icteric. EOMI. PERRL      Mouth/Throat: Mucous membranes are moist.       Neck: Supple with no signs of meningismus. Cardiovascular: Tachycardic with regular rhythm. No murmurs, gallops, or rubs. 2+ symmetrical distal pulses are present in all extremities. No JVD. Respiratory: Normal respiratory effort. Decreased air movement bilaterally with no wheezing or crackles  Gastrointestinal: Soft, non tender, and non distended with positive bowel sounds. No rebound or guarding. Musculoskeletal: Nontender with normal range of motion in all extremities. No edema, cyanosis, or erythema of extremities. Neurologic: Normal speech and language. Face is symmetric.  Moving all extremities. No gross focal neurologic deficits are appreciated. Skin: Skin is warm, dry and intact. No rash noted. Psychiatric: Mood and affect are normal. Speech and behavior are normal.  ____________________________________________   LABS (all labs ordered are listed, but only abnormal results are displayed)  Labs Reviewed  BASIC METABOLIC PANEL - Abnormal; Notable for the following:       Result Value   Chloride 100 (*)    Glucose, Bld 105 (*)    All other components within normal limits  CBC - Abnormal; Notable for the following:    RBC 5.28 (*)    RDW 14.7 (*)    All other components within normal limits  FIBRIN DERIVATIVES D-DIMER ([facility] ONLY) - Abnormal; Notable for the following:    Fibrin derivatives D-dimer ([facility]) 507.05 (*)    All other components within normal limits  BASIC METABOLIC PANEL - Abnormal; Notable for the following:    Potassium 3.4 (*)    Glucose, Bld 173 (*)    Calcium 8.8 (*)    All other components within normal limits  CBC - Abnormal; Notable for the following:    RDW 14.7 (*)    All other components within normal limits  TROPONIN I  MAGNESIUM  TROPONIN I  HIV ANTIBODY (ROUTINE TESTING)   ____________________________________________  EKG  ED ECG REPORT I, Rudene Re, the attending physician, personally viewed and interpreted this ECG.  Sinus rhythm with frequent PVCs, rate of 99, normal intervals, normal axis, no ST elevations or depressions.  ____________________________________________  RADIOLOGY  CXR:  No active cardiopulmonary disease. Stable chest. ____________________________________________   PROCEDURES  Procedure(s) performed: None Procedures Critical Care performed:  None ____________________________________________   INITIAL IMPRESSION / ASSESSMENT AND PLAN / ED COURSE  51 y.o. female with h/o COPD, active smoker, HTN, native CAD, anxiety and lupus who presents for evaluation of shortness of breath  and productive cough. They should setting well room air with normal work of breathing however has diminished air movement bilaterally concerning for COPD exacerbation in the setting of a viral URI. Chest x-ray with no infiltrate. No fever, therefore will not perform flu swab. Will give duoneb x 3 and steroids and reassess. EKG with no evidence of ischemia. Patient has multiple PVCs, will check electrolytes. Will cycle cardiac  enzymes.   Clinical Course as of Jun 06 1116  Wed Jun 05, 2016  1900 Patient's d-dimer is borderline elevated. I discussed this finding with patient and explained that I am unable to rule out a blood clot since the d-dimer is elevated and recommended imaging test. Patient is concerned about undergoing a CT of the chest with contrast as she has a documented allergic reaction that his tachycardia. I explained to the patient that that is not a true allergic reaction and that I am happy to give her a small dose of beta blocker before she goes in to prevent that from developing. Patient continues to refuse the test. I then offered a VQ scan however patient tells me that she feels great, the shortness of breath and chest pain have resolved and she wants to go home and not undergo any further treatment at this time. I explained to her that if she has a blood clot it can get bigger to the point that she can have cardiac arrest and die from this if this diagnosis is missed and not treated with anticoagulants. She understands the risks but continues to refuse any imaging as she is afraid of allergic reaction to the dye and does not want to wait 2-4 hours which is the time it takes for VQ dye to arrive and the study to be done and read. She says she'll follow-up with her pulmonologist tomorrow or retunr to the ER if her CP or SOB return. 2nd trop is pending. PVCs markedly improved after IV mag. If troponin is negative, will dc home with close f/u with pulmonologist and also cardiologist.  [place]  Patient ambulated and became severely dyspneic, complaining of SOB. She is now in agreement to undergo VQ scan which has been ordered. Patient also became tachycardic to 120s. Oxygen remained normal at 95%. Will discuss with the hospitalist for admission.   [CV]    Clinical Course User Index [CV] Rudene Re, MD    Pertinent labs & imaging results that were available during my care of the patient were reviewed by me and considered in my medical decision making (see chart for details).    ____________________________________________   FINAL CLINICAL IMPRESSION(S) / ED DIAGNOSES  Final diagnoses:  COPD exacerbation ([place])  Chest pain, unspecified type  Shortness of breath      NEW MEDICATIONS STARTED DURING THIS VISIT:  Current Discharge Medication List    START taking these medications   Details  predniSONE (DELTASONE) 20 MG tablet Take 3 tablets (60 mg total) by mouth daily. Qty: 12 tablet, Refills: 0         Note:  This document was prepared using Dragon voice recognition software and may include unintentional dictation errors.    Rudene Re, MD 06/06/16 1118

## 2016-06-05 NOTE — H&P (Signed)
[facility] NAME: Chayce Rullo    MR#:  [id number]  DATE OF BIRTH:  06/15/1965  DATE OF ADMISSION:  06/05/2016  PRIMARY CARE PHYSICIAN: Lelon Huh, MD   REQUESTING/REFERRING PHYSICIAN: Dr. Rudene Re  CHIEF COMPLAINT:   Chief Complaint  Patient presents with  . Shortness of Breath    HISTORY OF PRESENT ILLNESS:  Saroya Riccobono  is a 51 y.o. female with a known history of Anxiety, lupus, sinus tachycardia chronic, COPD not on home oxygen, ongoing smoking, GERD presents to hospital secondary to worsening shortness of breath and cough for 5 days now. Symptoms started with dry cough about 5 days ago, she also had on and off cold sweats at the time. Denies any fevers. No nausea or vomiting or diarrhea. Complains of chest tightness and worsening dyspnea in the last couple of days. She also started having congestion, sinus drainage, postnasal discharge and sinus pain for the last 2 days. Symptoms were not resolving and actually getting worse today so presented to the emergency room. She had significant wheezing, hypoxia with exertion here in spite of DuoNeb treatments. She also complains of yellowish nasal discharge and productive phlegm at this time. Due to persistent wheezing and hypoxia on exertion, she is being admitted for COPD exacerbation.  PAST MEDICAL HISTORY:   Past Medical History:  Diagnosis Date  . Anxiety    panic attacks, chest pain  . Anxiety   . Arrhythmia   . Arthritis    fingers  . CAD in native artery    a. cardiac cath 01/2014: showed minor irregularities, mildly elevated left ventricular end-diastolic pressure and normal ejection fraction  . COPD (chronic obstructive pulmonary disease) ([place])   . Emphysema/COPD ([place])   . GERD (gastroesophageal reflux disease)   . Hypertension   . Lupus   . PONV (postoperative nausea and vomiting)   . Wears dentures    partial upper and lower    PAST SURGICAL  HISTORY:   Past Surgical History:  Procedure Laterality Date  . CARDIAC CATHETERIZATION  01/03/14  . CARDIAC CATHETERIZATION    . EYE SURGERY    . IMAGE GUIDED SINUS SURGERY N/A 11/30/2014   Procedure: IMAGE GUIDED SINUS SURGERY;  Surgeon: Carloyn Manner, MD;  Location: [facility];  Service: ENT;  Laterality: N/A;  GAVE DISK TO CE CE  . MAXILLARY ANTROSTOMY Bilateral 11/30/2014   Procedure: MAXILLARY ANTROSTOMY;  Surgeon: Carloyn Manner, MD;  Location: [facility];  Service: ENT;  Laterality: Bilateral;  . SEPTOPLASTY N/A 11/30/2014   Procedure: SEPTOPLASTY;  Surgeon: Carloyn Manner, MD;  Location[place];  Service: ENT;  Laterality: N/A;  . SPHENOIDECTOMY Left 11/30/2014   Procedure: Coralee Pesa, ;  Surgeon: Carloyn Manner, MD;  Location: [place];  Service: ENT;  Laterality: Left;  Marland Kitchen VAGINAL HYSTERECTOMY  1995   vaginal    SOCIAL HISTORY:   Social History  Substance Use Topics  . Smoking status: Current Every Day Smoker    Packs/day: 1.00    Years: 16.00    Types: Cigarettes  . Smokeless tobacco: Never Used  . Alcohol use No    FAMILY HISTORY:   Family History  Problem Relation Age of Onset  . Emphysema Father   . Asthma Father   . Heart disease Father   . Cancer Father     colon  . Heart attack Father   . Arthritis Father     RA  .  Hypertension Mother     DRUG ALLERGIES:   Allergies  Allergen Reactions  . Alum & Mag Hydroxide-Simeth Diarrhea and Nausea Only  . Cefdinir Other (See Comments)    tachycardia  . Doxycycline Other (See Comments)    Nausea, migraine  . Ivp Dye [Iodinated Diagnostic Agents] Palpitations    REVIEW OF SYSTEMS:   Review of Systems  Constitutional: Positive for malaise/fatigue. Negative for chills, fever and weight loss.  HENT: Positive for sinus pain. Negative for ear discharge, ear pain, hearing loss and nosebleeds.   Eyes: Negative for blurred vision, double vision and  photophobia.  Respiratory: Positive for cough and shortness of breath. Negative for hemoptysis and wheezing.   Cardiovascular: Positive for chest pain. Negative for palpitations, orthopnea and leg swelling.  Gastrointestinal: Negative for abdominal pain, constipation, diarrhea, melena, nausea and vomiting.  Genitourinary: Negative for dysuria and urgency.  Musculoskeletal: Positive for back pain. Negative for myalgias and neck pain.  Skin: Negative for rash.  Neurological: Negative for dizziness, tremors, sensory change, speech change and focal weakness.  Endo/Heme/Allergies: Does not bruise/bleed easily.  Psychiatric/Behavioral: Negative for depression.    MEDICATIONS AT HOME:   Prior to Admission medications   Medication Sig Start Date End Date Taking? Authorizing Provider  albuterol (PROVENTIL HFA;VENTOLIN HFA) 108 (90 BASE) MCG/ACT inhaler Inhale 2 puffs into the lungs every 6 (six) hours as needed for wheezing or shortness of breath. 11/24/14  Yes Vishal Mungal, MD  albuterol (PROVENTIL) (2.5 MG/3ML) 0.083% nebulizer solution Take 3 mLs (2.5 mg total) by nebulization every 4 (four) hours as needed for wheezing or shortness of breath. Reported on 03/19/2015 03/19/15  Yes Theodoro Grist, MD  ALPRAZolam Duanne Moron) 1 MG tablet TAKE 1 TABLET BY MOUTH EVERY 8 HOURS AS NEEDED 03/07/16  Yes Birdie Sons, MD  clonazePAM (KLONOPIN) 1 MG tablet TAKE 1 TABLET BY MOUTH TWICE A DAY AS NEEDED Patient taking differently: TAKE 1 TABLET BY MOUTH TWICE A DAY 03/22/16  Yes Birdie Sons, MD  hydroxychloroquine (PLAQUENIL) 200 MG tablet Take 2 tablets (400 mg total) by mouth daily. 02/12/16  Yes Birdie Sons, MD  lansoprazole (PREVACID) 30 MG capsule Take 30 mg by mouth 2 (two) times daily before a meal.   Yes Historical Provider, MD  nitroGLYCERIN (NITROSTAT) 0.4 MG SL tablet Place 1 tablet (0.4 mg total) under the tongue every 5 (five) minutes as needed for chest pain. 12/08/14  Yes Wellington Hampshire, MD    sucralfate (CARAFATE) 1 g tablet Take 1 tablet (1 g total) by mouth 4 (four) times daily -  with meals and at bedtime. 06/03/16  Yes Carmon Ginsberg, PA  TraZODone HCl 150 MG TB24 Take 150 mg by mouth at bedtime as needed. Reported on 03/19/2015   Yes Historical Provider, MD  ARIPiprazole (ABILIFY) 5 MG tablet Take 5 mg by mouth daily. Reported on 03/19/2015    Historical Provider, MD  predniSONE (DELTASONE) 20 MG tablet Take 3 tablets (60 mg total) by mouth daily. 06/05/16 06/09/16  Rudene Re, MD  Respiratory Therapy Supplies (FLUTTER) DEVI 1 Device by Does not apply route daily. 03/19/15   Theodoro Grist, MD      VITAL SIGNS:  Blood pressure 121/71, pulse (S) (!) 121, temperature 98.2 F (36.8 C), temperature source Oral, resp. rate (!) 24, SpO2 95 %.  PHYSICAL EXAMINATION:   Physical Exam  GENERAL:  51 y.o.-year-old patient lying in the bed with no acute distress.  EYES: Pupils equal, round, reactive to light and  accommodation. No scleral icterus. Extraocular muscles intact.  HEENT: Head atraumatic, normocephalic. Oropharynx and nasopharynx clear.  NECK:  Supple, no jugular venous distention. No thyroid enlargement, no tenderness.  LUNGS: Normal breath sounds bilaterally, scattered wheezing,no rales,rhonchi or crepitation. No use of accessory muscles of respiration.  CARDIOVASCULAR: S1, S2 normal. No murmurs, rubs, or gallops.  ABDOMEN: Soft, nontender, nondistended. Bowel sounds present. No organomegaly or mass.  EXTREMITIES: No pedal edema, cyanosis, or clubbing.  NEUROLOGIC: Cranial nerves II through XII are intact. Muscle strength 5/5 in all extremities. Sensation intact. Gait not checked.  PSYCHIATRIC: The patient is alert and oriented x 3.  SKIN: No obvious rash, lesion, or ulcer.   LABORATORY PANEL:   CBC  Recent Labs Lab 06/05/16 1546  WBC 10.3  HGB 14.6  HCT 42.6  PLT 245    ------------------------------------------------------------------------------------------------------------------  Chemistries   Recent Labs Lab 06/05/16 1546  NA 137  K 4.0  CL 100*  CO2 29  GLUCOSE 105*  BUN 10  CREATININE 0.84  CALCIUM 9.5  MG 2.0   ------------------------------------------------------------------------------------------------------------------  Cardiac Enzymes  Recent Labs Lab 06/05/16 1843  TROPONINI <0.03   ------------------------------------------------------------------------------------------------------------------  RADIOLOGY:  Dg Chest 2 View  Result Date: 06/05/2016 CLINICAL DATA:  Patient reports feeling short of breath. Productive cough with sputum. EXAM: CHEST  2 VIEW COMPARISON:  06/02/2016. FINDINGS: The heart size and mediastinal contours are within normal limits. Both lungs are clear. The visualized skeletal structures are unremarkable. Aortic atherosclerosis. IMPRESSION: No active cardiopulmonary disease.  Stable chest. Electronically Signed   By: Staci Righter M.D.   On: 06/05/2016 16:02    EKG:   Orders placed or performed during the hospital encounter of 06/05/16  . ED EKG  . ED EKG  . EKG 12-Lead  . EKG 12-Lead    IMPRESSION AND PLAN:   Jinnie Onley  is a 51 y.o. female with a known history of Anxiety, lupus, sinus tachycardia chronic, COPD not on home oxygen, ongoing smoking, GERD presents to hospital secondary to worsening shortness of breath and cough for 5 days now.  #1 COPD exacerbation with acute bronchitis-chest x-ray with no infiltrate. -Started on IV steroids, DuoNeb treatments and also added Levaquin. -Continue oxygen support as needed. Check ambulatory sats in a.m. -Counseled about smoking. -Due to significant dyspnea and hypoxia, advised CT chest to rule out PE, however patient refused contrast due to history of worsening sinus tachycardia with contrast. Ordered VQ scan  #2 sinus tachycardia-chronic.  Worsened secondary to hypoxia. -Monitor. If greater than 120s will start on low-dose oral Cardizem. -Followed with cardiology in the past and also had a cardiac catheterization which only showed minimal coronary artery disease.  #3 Lupus-no active flare noted. Continue Plaquenil.  #4 anxiety-on Klonopin and when necessary Xanax. Also on Abilify for depression.  #5 GERD-on sucralfate and Protonix  #6 DVT prophylaxis-started on Lovenox    All the records are reviewed and case discussed with ED provider. Management plans discussed with the patient, family and they are in agreement.  CODE STATUS: Full code  TOTAL TIME TAKING CARE OF THIS PATIENT: 50 minutes.    Gladstone Lighter M.D on 06/05/2016 at 8:55 PM  Between 7am to 6pm - Pager - [phone]  After 6pm go to [email or web address] - password [email or web address] [facility]  Office  [phone]  CC: Primary care physician; Lelon Huh, MD

## 2016-06-05 NOTE — ED Triage Notes (Signed)
Pt reports feeling short of breath since last Friday, pt reports a productive cough with yellow sputum, pt denies fever or chest pain

## 2016-06-05 NOTE — Progress Notes (Signed)
Pt refused lovenox. Benefits of medication was discussed with pt. She wants to wait until she gets the result from VQ scan

## 2016-06-05 NOTE — Discharge Instructions (Signed)
You were seen for chest pain. As I explained to you, your blood work to rule out a blood clot was elevated and I recommended that you underwent a CT of your chest or a VQ scan. Please return at any time if you wish to continue your evaluation. It is imperative that you follow up with your doctor or pulmonologist in 24 hours for re-evaluation. Return to the ER if you have recurrence of your shortness of breath and/or chest pain.  When should you call for help?  Call 911 if: You passed out (lost consciousness) or if you feel dizzy. You have difficulty breathing. You have symptoms of a heart attack. These may include: Chest pain or pressure, or a strange feeling in your chest. Indigestion. Sweating. Shortness of breath. Nausea or vomiting. Pain, pressure, or a strange feeling in your back, neck, jaw, or upper belly or in one or both shoulders or arms. Lightheadedness or sudden weakness. A fast or irregular heartbeat. After you call 911, the operator may tell you to chew 1 adult-strength or 2 to 4 low-dose aspirin. Wait for an ambulance. Do not try to drive yourself.   Call your doctor today if: You have any trouble breathing. Your chest pain gets worse. You are dizzy or lightheaded, or you feel like you may faint. You are not getting better as expected. You are having new or different chest pain  How can you care for yourself at home? Rest until you feel better. Take your medicine exactly as prescribed. Call your doctor if you think you are having a problem with your medicine. Do not drive after taking a prescription pain medicine.

## 2016-06-05 NOTE — ED Notes (Signed)
Patient transported to X-ray 

## 2016-06-05 NOTE — ED Notes (Signed)
Patient ambulated in hallway while checking vital signs.  Patient's HR increased and patient has tachypnea at 24 bpm. Patient states "I feel short of breath".  Patient okay with staying for admission and not leaving AMA. MD made aware at this time and now at bedside discussing options.

## 2016-06-06 ENCOUNTER — Inpatient Hospital Stay: Payer: Medicare Other

## 2016-06-06 LAB — CBC
HCT: 39.3 % (ref 35.0–47.0)
Hemoglobin: 13.2 g/dL (ref 12.0–16.0)
MCH: 27.2 pg (ref 26.0–34.0)
MCHC: 33.6 g/dL (ref 32.0–36.0)
MCV: 80.7 fL (ref 80.0–100.0)
Platelets: 205 10*3/uL (ref 150–440)
RBC: 4.87 MIL/uL (ref 3.80–5.20)
RDW: 14.7 % — AB (ref 11.5–14.5)
WBC: 9.8 10*3/uL (ref 3.6–11.0)

## 2016-06-06 LAB — BASIC METABOLIC PANEL
Anion gap: 10 (ref 5–15)
BUN: 12 mg/dL (ref 6–20)
CO2: 23 mmol/L (ref 22–32)
Calcium: 8.8 mg/dL — ABNORMAL LOW (ref 8.9–10.3)
Chloride: 105 mmol/L (ref 101–111)
Creatinine, Ser: 0.71 mg/dL (ref 0.44–1.00)
Glucose, Bld: 173 mg/dL — ABNORMAL HIGH (ref 65–99)
Potassium: 3.4 mmol/L — ABNORMAL LOW (ref 3.5–5.1)
SODIUM: 138 mmol/L (ref 135–145)

## 2016-06-06 MED ORDER — METHYLPREDNISOLONE SODIUM SUCC 125 MG IJ SOLR: 60.0000 mg | Freq: Two times a day (BID) | INTRAMUSCULAR | Status: DC

## 2016-06-06 MED ORDER — PREDNISONE 10 MG PO TABS: ORAL_TABLET | ORAL | 0 refills | Status: DC

## 2016-06-06 MED ORDER — PREDNISONE 50 MG PO TABS: 50.0000 mg | ORAL_TABLET | Freq: Every day | ORAL | Status: DC

## 2016-06-06 MED ORDER — TECHNETIUM TO 99M ALBUMIN AGGREGATED
4.0000 | Freq: Once | INTRAVENOUS | Status: AC | PRN
  Administered 2016-06-06: 4.29 via INTRAVENOUS

## 2016-06-06 MED ORDER — TECHNETIUM TC 99M DIETHYLENETRIAME-PENTAACETIC ACID
30.0000 | Freq: Once | INTRAVENOUS | Status: AC | PRN
  Administered 2016-06-06: 32.78 via INTRAVENOUS

## 2016-06-06 MED ORDER — LEVOFLOXACIN 500 MG PO TABS: 500.0000 mg | ORAL_TABLET | Freq: Every day | ORAL | 0 refills | Status: DC

## 2016-06-06 NOTE — Discharge Summary (Signed)
[facility] [place] NAME: Andrea [name]    MR#:  [id number]  DATE OF BIRTH:  16-Nov-1965  DATE OF ADMISSION:  06/05/2016 ADMITTING PHYSICIAN: Gladstone Lighter, MD  DATE OF DISCHARGE: 06/06/2016  PRIMARY CARE PHYSICIAN: Lelon Huh, MD    ADMISSION DIAGNOSIS:  Shortness of breath [R06.02] COPD exacerbation (HCC) [J44.1] Chest pain, unspecified type [R07.9]  DISCHARGE DIAGNOSIS:  Acute on chronic COPD exacerbation Tobacco abuse  SECONDARY DIAGNOSIS:   Past Medical History:  Diagnosis Date  . Anxiety    panic attacks, chest pain  . Anxiety   . Arrhythmia   . Arthritis    fingers  . CAD in native artery    a. cardiac cath 01/2014: showed minor irregularities, mildly elevated left ventricular end-diastolic pressure and normal ejection fraction  . COPD (chronic obstructive pulmonary disease) ([place])   . Emphysema/COPD ([place])   . GERD (gastroesophageal reflux disease)   . Hypertension   . Lupus   . PONV (postoperative nausea and vomiting)   . Wears dentures    partial upper and lower    HOSPITAL COURSE:  Andrea [name]  is a 51 y.o. female with a known history of Anxiety, lupus, sinus tachycardia chronic, COPD not on home oxygen, ongoing smoking, GERD presents to hospital secondary to worsening shortness of breath and cough for 5 days now.  #1 COPD exacerbation with acute bronchitis-chest x-ray with no infiltrate. -Started on IV steroids--change to oral taper - DuoNeb treatments and also added Levaquin. -Continue oxygen support as needed. Check ambulatory sats in a.m. -Counseled about smoking. -Due to significant dyspnea and hypoxia, advised CT chest to rule out PE, however patient refused contrast due to history of worsening sinus tachycardia with contrast. Ordered VQ scan---negative for PE  #2 sinus tachycardia-chronic. Worsened secondary to hypoxia. -resolved  #3 Lupus-no active flare noted. Continue  Plaquenil.  #4 anxiety-on Klonopin and when necessary Xanax. Also on Abilify for depression.  #5 GERD-on sucralfate and Protonix  #6 DVT prophylaxis-started on Lovenox  Overall stable sats 97% on RA  D/c home CONSULTS OBTAINED:    DRUG ALLERGIES:   Allergies  Allergen Reactions  . Alum & Mag Hydroxide-Simeth Diarrhea and Nausea Only  . Cefdinir Other (See Comments)    tachycardia  . Doxycycline Other (See Comments)    Nausea, migraine  . Ivp Dye [Iodinated Diagnostic Agents] Palpitations    DISCHARGE MEDICATIONS:   Current Discharge Medication List    START taking these medications   Details  levofloxacin (LEVAQUIN) 500 MG tablet Take 1 tablet (500 mg total) by mouth daily. Qty: 5 tablet, Refills: 0    predniSONE (DELTASONE) 10 MG tablet Take 50 mg daily taper by 10 mg daily then stop Qty: 15 tablet, Refills: 0      CONTINUE these medications which have NOT CHANGED   Details  albuterol (PROVENTIL HFA;VENTOLIN HFA) 108 (90 BASE) MCG/ACT inhaler Inhale 2 puffs into the lungs every 6 (six) hours as needed for wheezing or shortness of breath. Qty: 1 Inhaler, Refills: 3    albuterol (PROVENTIL) (2.5 MG/3ML) 0.083% nebulizer solution Take 3 mLs (2.5 mg total) by nebulization every 4 (four) hours as needed for wheezing or shortness of breath. Reported on 03/19/2015 Qty: 75 mL, Refills: 12    ALPRAZolam (XANAX) 1 MG tablet TAKE 1 TABLET BY MOUTH EVERY 8 HOURS AS NEEDED Qty: 60 tablet, Refills: 3    clonazePAM (KLONOPIN) 1 MG tablet TAKE 1 TABLET BY MOUTH TWICE A  DAY AS NEEDED Qty: 90 tablet, Refills: 3    hydroxychloroquine (PLAQUENIL) 200 MG tablet Take 2 tablets (400 mg total) by mouth daily. Qty: 180 tablet, Refills: 3    lansoprazole (PREVACID) 30 MG capsule Take 30 mg by mouth 2 (two) times daily before a meal.    nitroGLYCERIN (NITROSTAT) 0.4 MG SL tablet Place 1 tablet (0.4 mg total) under the tongue every 5 (five) minutes as needed for chest pain. Qty: 25  tablet, Refills: 0    sucralfate (CARAFATE) 1 g tablet Take 1 tablet (1 g total) by mouth 4 (four) times daily -  with meals and at bedtime. Qty: 28 tablet, Refills: 0   Associated Diagnoses: Gastroesophageal reflux disease with esophagitis    TraZODone HCl 150 MG TB24 Take 150 mg by mouth at bedtime as needed. Reported on 03/19/2015    ARIPiprazole (ABILIFY) 5 MG tablet Take 5 mg by mouth daily. Reported on 03/19/2015    Respiratory Therapy Supplies (FLUTTER) DEVI 1 Device by Does not apply route daily. Qty: 1 each, Refills: 0        If you experience worsening of your admission symptoms, develop shortness of breath, life threatening emergency, suicidal or homicidal thoughts you must seek medical attention immediately by calling 911 or calling your MD immediately  if symptoms less severe.  You Must read complete instructions/literature along with all the possible adverse reactions/side effects for all the Medicines you take and that have been prescribed to you. Take any new Medicines after you have completely understood and accept all the possible adverse reactions/side effects.   Please note  You were cared for by a hospitalist during your hospital stay. If you have any questions about your discharge medications or the care you received while you were in the hospital after you are discharged, you can call the unit and asked to speak with the hospitalist on call if the hospitalist that took care of you is not available. Once you are discharged, your primary care physician will handle any further medical issues. Please note that NO REFILLS for any discharge medications will be authorized once you are discharged, as it is imperative that you return to your primary care physician (or establish a relationship with a primary care physician if you do not have one) for your aftercare needs so that they can reassess your need for medications and monitor your lab values. Today   SUBJECTIVE  Doing  well   VITAL SIGNS:  Blood pressure (!) 103/54, pulse 99, temperature 97.6 F (36.4 C), temperature source Oral, resp. rate 16, height 5\' 3"  (1.6 m), weight 73.1 kg (161 lb 3.2 oz), SpO2 95 %.  I/O:   Intake/Output Summary (Last 24 hours) at 06/06/16 1358 Last data filed at 06/06/16 0800  Gross per 24 hour  Intake              240 ml  Output                0 ml  Net              240 ml    PHYSICAL EXAMINATION:  GENERAL:  51 y.o.-year-old patient lying in the bed with no acute distress.  EYES: Pupils equal, round, reactive to light and accommodation. No scleral icterus. Extraocular muscles intact.  HEENT: Head atraumatic, normocephalic. Oropharynx and nasopharynx clear.  NECK:  Supple, no jugular venous distention. No thyroid enlargement, no tenderness.  LUNGS: Normal breath sounds bilaterally, no wheezing, rales,rhonchi or crepitation.  No use of accessory muscles of respiration.  CARDIOVASCULAR: S1, S2 normal. No murmurs, rubs, or gallops.  ABDOMEN: Soft, non-tender, non-distended. Bowel sounds present. No organomegaly or mass.  EXTREMITIES: No pedal edema, cyanosis, or clubbing.  NEUROLOGIC: Cranial nerves II through XII are intact. Muscle strength 5/5 in all extremities. Sensation intact. Gait not checked.  PSYCHIATRIC: The patient is alert and oriented x 3.  SKIN: No obvious rash, lesion, or ulcer.   DATA REVIEW:   CBC   Recent Labs Lab 06/06/16 0447  WBC 9.8  HGB 13.2  HCT 39.3  PLT 205    Chemistries   Recent Labs Lab 06/05/16 1546 06/06/16 0447  NA 137 138  K 4.0 3.4*  CL 100* 105  CO2 29 23  GLUCOSE 105* 173*  BUN 10 12  CREATININE 0.84 0.71  CALCIUM 9.5 8.8*  MG 2.0  --     Microbiology Results   No results found for this or any previous visit (from the past 240 hour(s)).  RADIOLOGY:  Dg Chest 2 View  Result Date: 06/05/2016 CLINICAL DATA:  Patient reports feeling short of breath. Productive cough with sputum. EXAM: CHEST  2 VIEW COMPARISON:   06/02/2016. FINDINGS: The heart size and mediastinal contours are within normal limits. Both lungs are clear. The visualized skeletal structures are unremarkable. Aortic atherosclerosis. IMPRESSION: No active cardiopulmonary disease.  Stable chest. Electronically Signed   By: Staci Righter M.D.   On: 06/05/2016 16:02   Nm Pulmonary Perf And Vent  Result Date: 06/06/2016 CLINICAL DATA:  Shortness of breath, cough for 6 days, history COPD, coronary artery disease, lupus, smoking, IV contrast allergy EXAM: NUCLEAR MEDICINE VENTILATION - PERFUSION LUNG SCAN TECHNIQUE: Ventilation images were obtained in multiple projections using inhaled aerosol Tc-66m DTPA. Perfusion images were obtained in multiple projections after intravenous injection of Tc-70m MAA. RADIOPHARMACEUTICALS:  32.78 mCi technetium-17m DTPA aerosol inhalation and 4.89 mCi technetium-54m MAA IV COMPARISON:  None Radiographic correlation:  Chest radiograph 06/05/2016 FINDINGS: Ventilation: Swallowed aerosol within esophagus. No focal ventilatory defects. Perfusion: Normal Chest radiograph:  Clear lungs IMPRESSION: Normal ventilation and perfusion lung scan. Electronically Signed   By: Lavonia Dana M.D.   On: 06/06/2016 13:28     Management plans discussed with the patient, family and they are in agreement.  CODE STATUS:     Code Status Orders        Start     Ordered   06/05/16 2138  Full code  Continuous     06/05/16 2137    Code Status History    Date Active Date Inactive Code Status Order ID Comments User Context   03/19/2015  1:51 AM 03/19/2015  4:48 PM Full Code [id number]  Harrie Foreman, MD Inpatient      TOTAL TIME TAKING CARE OF THIS PATIENT: 40* minutes.    Leiana Rund M.D on 06/06/2016 at 1:58 PM  Between 7am to 6pm - Pager - [phone] After 6pm go to [email or web address] - password [email or web address] [facility]  Office  [phone]  CC: Primary care physician; Lelon Huh, MD

## 2016-06-06 NOTE — Progress Notes (Signed)
Pt discharged to home via wheelchair accompanied by spouse without incident per MD order. All discharge teachings done both written and verbal. All questions answered. Pt verbalizes understanding of teachings and agrees to comply. No change in pt from AM assessment. Pt denies pain on discharge to home.

## 2016-06-07 ENCOUNTER — Telehealth: Payer: Self-pay

## 2016-06-07 LAB — HIV ANTIBODY (ROUTINE TESTING W REFLEX): HIV Screen 4th Generation wRfx: NONREACTIVE

## 2016-06-07 NOTE — Telephone Encounter (Signed)
Transition Care Management Follow-up Telephone Call    Date discharged?   How have you been since you were released from the hospital? Doing ok, breathing better but not completely back to normal yet. Pt states she has a slight cough. Denies sputum, n/v/d, or fever.    Any patient concerns? Spasms in intestines and lungs. Pt has not had a BM in 2 days.   Items Reviewed:  Medications reviewed: Yes  Allergies reviewed: Yes  Dietary changes reviewed: N/A  Referrals reviewed: N/A   Functional Questionnaire:  Independent - I Dependent - D    Activities of Daily Living (ADLs):    Personal hygiene - I Dressing - I Eating - I Maintaining continence - I Transferring - I   Independent Activities of Daily Living (iADLs): Basic communication skills - I Transportation - D Meal preparation - D Shopping - D Housework - D Managing medications - I  Managing personal finances - I   Confirmed importance and date/time of follow-up visits scheduled YES  Provider Appointment booked with PCP 06/11/16 for CPE. Will clarify if ok to do hospital f/u at same visit.  Confirmed with patient if condition begins to worsen call PCP or go to the ER.  Patient was given the office number and encouraged to call back with question or concerns: YES

## 2016-06-10 ENCOUNTER — Other Ambulatory Visit: Payer: Self-pay | Admitting: Family Medicine

## 2016-06-10 NOTE — Telephone Encounter (Signed)
Please call in clonazepam  

## 2016-06-11 ENCOUNTER — Encounter: Payer: Self-pay | Admitting: Family Medicine

## 2016-06-11 ENCOUNTER — Ambulatory Visit (INDEPENDENT_AMBULATORY_CARE_PROVIDER_SITE_OTHER): Payer: Medicare Other | Admitting: Family Medicine

## 2016-06-11 VITALS — BP 126/80 | HR 68 | Temp 98.1°F | Resp 16 | Ht 63.0 in | Wt 163.0 lb

## 2016-06-11 DIAGNOSIS — J301 Allergic rhinitis due to pollen: Secondary | ICD-10-CM | POA: Diagnosis not present

## 2016-06-11 DIAGNOSIS — Z720 Tobacco use: Secondary | ICD-10-CM

## 2016-06-11 DIAGNOSIS — L930 Discoid lupus erythematosus: Secondary | ICD-10-CM

## 2016-06-11 DIAGNOSIS — J309 Allergic rhinitis, unspecified: Secondary | ICD-10-CM | POA: Insufficient documentation

## 2016-06-11 DIAGNOSIS — Z0000 Encounter for general adult medical examination without abnormal findings: Secondary | ICD-10-CM

## 2016-06-11 DIAGNOSIS — J441 Chronic obstructive pulmonary disease with (acute) exacerbation: Secondary | ICD-10-CM | POA: Diagnosis not present

## 2016-06-11 DIAGNOSIS — Z860101 Personal history of adenomatous and serrated colon polyps: Secondary | ICD-10-CM

## 2016-06-11 DIAGNOSIS — I10 Essential (primary) hypertension: Secondary | ICD-10-CM | POA: Diagnosis not present

## 2016-06-11 DIAGNOSIS — Z86010 Personal history of colonic polyps: Secondary | ICD-10-CM

## 2016-06-11 DIAGNOSIS — Z1159 Encounter for screening for other viral diseases: Secondary | ICD-10-CM | POA: Diagnosis not present

## 2016-06-11 DIAGNOSIS — R0602 Shortness of breath: Secondary | ICD-10-CM | POA: Diagnosis not present

## 2016-06-11 DIAGNOSIS — J449 Chronic obstructive pulmonary disease, unspecified: Secondary | ICD-10-CM

## 2016-06-11 MED ORDER — MONTELUKAST SODIUM 10 MG PO TABS: 10.0000 mg | ORAL_TABLET | Freq: Every day | ORAL | 3 refills | Status: DC

## 2016-06-11 MED ORDER — ALBUTEROL SULFATE HFA 108 (90 BASE) MCG/ACT IN AERS: 2.0000 | INHALATION_SPRAY | Freq: Four times a day (QID) | RESPIRATORY_TRACT | 3 refills | Status: DC | PRN

## 2016-06-11 MED ORDER — LEVOFLOXACIN 500 MG PO TABS: 500.0000 mg | ORAL_TABLET | Freq: Every day | ORAL | 0 refills | Status: AC

## 2016-06-11 MED ORDER — NICOTINE 14 MG/24HR TD PT24: 14.0000 mg | MEDICATED_PATCH | Freq: Every day | TRANSDERMAL | 0 refills | Status: DC

## 2016-06-11 MED ORDER — PREDNISONE 10 MG PO TABS: ORAL_TABLET | ORAL | 0 refills | Status: AC

## 2016-06-11 NOTE — Patient Instructions (Signed)
Please call the [facility] ([phone]) to schedule a routine screening mammogram.  

## 2016-06-11 NOTE — Progress Notes (Signed)
Patient: Andrea [name], Female    DOB: 1965-10-21, 51 y.o.   MRN: [id number] Visit Date: 06/11/2016  Today's Provider: Lelon Huh, MD   Chief Complaint  Patient presents with  . Annual Exam  . Hospitalization Follow-up  . Gastroesophageal Reflux   Subjective:    Annual physical exam Andrea [name] is a 51 y.o. female who presents today for health maintenance and complete physical. She feels fairly well. She reports exercising none. She reports she is sleeping fairly well.    Follow up Hospitalization  Patient was admitted to [facility] on 06/05/2016 and discharged on 06/06/2016. She was treated for COPD exacerbation. Treatment for this included Levaquin and prednisone. She reports satisfactory compliance with treatment. She reports this condition is Improved. She has follow up scheduled with her pulmonologist the first week of May.   She is still smoking and states she cant take Chantix due to bad dreams. She was given 21mg  patch at hospital which she states worked well, but she is only smoking 1/2 ppd at this time.     GERD, follow up: Patient was last seen in the office on 06/03/2016. She was started on Sucralfate, and continue the prevacid twice daily. Patient reports that she is still having breakthrough symptoms. She reports that she takes Tums as well. Patient reports that she has decreased her caffeine intake and greasy/spicy foods. Patient reports that she has tried Nexium, Prilosec, and Zantac in the past with no relief. She has follow up scheduled with Dr. Allen Norris first week of May  She also reports allergies are terrible, having constant nasal and sinus drainage and congestion. Sneezing, and itchy watery eyes.   She is also also on plaquenil chronically for Discoid lupus and reports she has had no recent flares, however she has not had eye exam for several years.   Review of Systems  Constitutional: Positive for activity change and fatigue. Negative for appetite  change, chills, diaphoresis, fever and unexpected weight change.  HENT: Positive for congestion and postnasal drip.   Eyes: Negative.   Respiratory: Positive for cough and shortness of breath.   Cardiovascular: Negative for chest pain, palpitations and leg swelling.  Gastrointestinal: Negative for abdominal distention, abdominal pain, anal bleeding, blood in stool, constipation, diarrhea, nausea, rectal pain and vomiting.  Endocrine: Negative.   Genitourinary: Negative.   Musculoskeletal: Negative.   Skin: Negative.   Neurological: Positive for headaches.  Hematological: Negative.   Psychiatric/Behavioral: The patient is nervous/anxious.     Social History      She  reports that she has been smoking Cigarettes.  She has a 16.00 pack-year smoking history. She has never used smokeless tobacco. She reports that she does not drink alcohol or use drugs.       Social History   Social History  . Marital status: Legally Separated    Spouse name: N/A  . Number of children: 3  . Years of education: N/A   Occupational History  . Disabled     Social Security as of 05/21/2011   Social History Main Topics  . Smoking status: Current Every Day Smoker    Packs/day: 1.00    Years: 16.00    Types: Cigarettes  . Smokeless tobacco: Never Used  . Alcohol use No  . Drug use: No  . Sexual activity: Not Asked   Other Topics Concern  . None   Social History Narrative   Lives at home with husband. Independent at baseline.  Past Medical History:  Diagnosis Date  . Anxiety    panic attacks, chest pain  . Arrhythmia   . CAD in native artery    a. cardiac cath 01/2014: showed minor irregularities, mildly elevated left ventricular end-diastolic pressure and normal ejection fraction  . PONV (postoperative nausea and vomiting)   . Wears dentures    partial upper and lower     Patient Active Problem List   Diagnosis Date Noted  . Hyperlipidemia 03/19/2015  . COPD exacerbation ([place])  10/04/2014  . Anxiety 09/22/2014  . Colon polyp 09/22/2014  . Depression 09/22/2014  . Dyshidrosis 09/22/2014  . GERD (gastroesophageal reflux disease) 09/22/2014  . Insomnia 09/22/2014  . Lung nodule, multiple 09/22/2014  . Panic disorder 09/22/2014  . Palpitations 01/17/2014  . Pulmonary nodule 06/10/2013  . Shortness of breath 05/20/2013  . COPD, GOLD B 05/20/2013  . Tobacco abuse 05/20/2013  . Daytime somnolence 05/20/2013  . Discoid lupus 05/30/2012  . Fibromyalgia 02/11/2012  . PVC's (premature ventricular contractions) 01/29/2011  . Essential (primary) hypertension 03/04/1998    Past Surgical History:  Procedure Laterality Date  . CARDIAC CATHETERIZATION  01/03/14  . CARDIAC CATHETERIZATION    . EYE SURGERY    . IMAGE GUIDED SINUS SURGERY N/A 11/30/2014   Procedure: IMAGE GUIDED SINUS SURGERY;  Surgeon: Carloyn Manner, MD;  Location: [place];  Service: ENT;  Laterality: N/A;  GAVE DISK TO CE CE  . MAXILLARY ANTROSTOMY Bilateral 11/30/2014   Procedure: MAXILLARY ANTROSTOMY;  Surgeon: Carloyn Manner, MD;  Location: [place];  Service: ENT;  Laterality: Bilateral;  . SEPTOPLASTY N/A 11/30/2014   Procedure: SEPTOPLASTY;  Surgeon: Carloyn Manner, MD;  Location: [place];  Service: ENT;  Laterality: N/A;  . SPHENOIDECTOMY Left 11/30/2014   Procedure: Coralee Pesa, ;  Surgeon: Carloyn Manner, MD;  Location: [place];  Service: ENT;  Laterality: Left;  Marland Kitchen VAGINAL HYSTERECTOMY  1995   vaginal    Family History        Family Status  Relation Status  . Father Deceased   hyperglycemia  . Mother Alive  . Sister Alive  . Brother Alive  . Sister Alive  . Brother Deceased        Her family history includes Arthritis in her father; Asthma in her father; Cancer in her father; Emphysema in her father; Heart attack in her father; Heart disease in her father; Hypertension in her mother.     Allergies  Allergen Reactions  . Alum  & Mag Hydroxide-Simeth Diarrhea and Nausea Only  . Cefdinir Other (See Comments)    tachycardia  . Doxycycline Other (See Comments)    Nausea, migraine  . Ivp Dye [Iodinated Diagnostic Agents] Palpitations     Current Outpatient Prescriptions:  .  albuterol (PROVENTIL HFA;VENTOLIN HFA) 108 (90 BASE) MCG/ACT inhaler, Inhale 2 puffs into the lungs every 6 (six) hours as needed for wheezing or shortness of breath., Disp: 1 Inhaler, Rfl: 3 .  albuterol (PROVENTIL) (2.5 MG/3ML) 0.083% nebulizer solution, Take 3 mLs (2.5 mg total) by nebulization every 4 (four) hours as needed for wheezing or shortness of breath. Reported on 03/19/2015, Disp: 75 mL, Rfl: 12 .  ALPRAZolam (XANAX) 1 MG tablet, TAKE 1 TABLET BY MOUTH EVERY 8 HOURS AS NEEDED, Disp: 60 tablet, Rfl: 3 .  ARIPiprazole (ABILIFY) 5 MG tablet, Take 5 mg by mouth daily. Reported on 03/19/2015, Disp: , Rfl:  .  clonazePAM (KLONOPIN) 1 MG tablet, TAKE 1 TABLET BY MOUTH TWICE  A DAY AS NEEDED, Disp: 90 tablet, Rfl: 1 .  hydroxychloroquine (PLAQUENIL) 200 MG tablet, Take 2 tablets (400 mg total) by mouth daily., Disp: 180 tablet, Rfl: 3 .  lansoprazole (PREVACID) 30 MG capsule, Take 30 mg by mouth 2 (two) times daily before a meal., Disp: , Rfl:  .  nitroGLYCERIN (NITROSTAT) 0.4 MG SL tablet, Place 1 tablet (0.4 mg total) under the tongue every 5 (five) minutes as needed for chest pain., Disp: 25 tablet, Rfl: 0 .  TraZODone HCl 150 MG TB24, Take 150 mg by mouth at bedtime as needed. Reported on 03/19/2015, Disp: , Rfl:  .  levofloxacin (LEVAQUIN) 500 MG tablet, Take 1 tablet (500 mg total) by mouth daily. (Patient not taking: Reported on 06/11/2016), Disp: 5 tablet, Rfl: 0 .  predniSONE (DELTASONE) 10 MG tablet, Take 50 mg daily taper by 10 mg daily then stop (Patient not taking: Reported on 06/11/2016), Disp: 15 tablet, Rfl: 0 .  Respiratory Therapy Supplies (FLUTTER) DEVI, 1 Device by Does not apply route daily. (Patient not taking: Reported on  06/11/2016), Disp: 1 each, Rfl: 0 .  sucralfate (CARAFATE) 1 g tablet, Take 1 tablet (1 g total) by mouth 4 (four) times daily -  with meals and at bedtime., Disp: 28 tablet, Rfl: 0   Patient Care Team: Birdie Sons, MD as PCP - General (Family Medicine)      Objective:   Vitals: BP 126/80 (BP Location: Left Arm, Patient Position: Sitting, Cuff Size: Normal)   Temp 98.1 F (36.7 C)    Vitals:   06/11/16 1412  BP: 126/80  Temp: 98.1 F (36.7 C)     Physical Exam   General Appearance:    Alert, cooperative, no distress, appears stated age  Head:    Normocephalic, without obvious abnormality, atraumatic  Eyes:    PERRL, conjunctiva/corneas clear, EOM's intact, fundi    benign, both eyes  Ears:    Normal TM's and external ear canals, both ears  Nose:   Nares normal, septum midline, mucosa normal, no drainage    or sinus tenderness  Throat:   Lips, mucosa, and tongue normal; teeth and gums normal  Neck:   Supple, symmetrical, trachea midline, no adenopathy;    thyroid:  no enlargement/tenderness/nodules; no carotid   bruit or JVD  Back:     Symmetric, no curvature, ROM normal, no CVA tenderness  Lungs:     Clear to auscultation bilaterally, respirations unlabored  Chest Wall:    No tenderness or deformity   Heart:    Regular rate and rhythm, S1 and S2 normal, no murmur, rub   or gallop  Breast Exam:    pateint refused.   Abdomen:     Soft, non-tender, bowel sounds active all four quadrants,    no masses, no organomegaly  Pelvic:    deferred  Extremities:   Extremities normal, atraumatic, no cyanosis or edema  Pulses:   2+ and symmetric all extremities  Skin:   Skin color, texture, turgor normal, no rashes or lesions  Lymph nodes:   Cervical, supraclavicular, and axillary nodes normal  Neurologic:   CNII-XII intact, normal strength, sensation and reflexes    throughout    Depression Screen No flowsheet data found.    Assessment & Plan:     Routine Health  Maintenance and Physical Exam  Exercise Activities and Dietary recommendations Goals    None      Immunization History  Administered Date(s) Administered  . Pneumococcal Conjugate-13  11/20/2012  . Pneumococcal Polysaccharide-23 08/10/2012    Health Maintenance  Topic Date Due  . Hepatitis C Screening  [birth year]  . TETANUS/TDAP  10/23/1982  . PAP SMEAR  10/22/1984  . MAMMOGRAM  10/22/2013  . COLONOSCOPY  10/22/2013  . INFLUENZA VACCINE  10/02/2016  . HIV Screening  Completed     Discussed health benefits of physical activity, and encouraged her to engage in regular exercise appropriate for her age and condition.     1. Annual physical exam She refuses breast exam. Recommended mammogram. Doesn't want pelvic exam since she has had hysterectomy.   2. COPD, GOLD B Follow up Pulmonary in May as scheduled. Continue current inhalers. Start montelukast when finished with prednisone.   3. Need for hepatitis C screening test  - Hepatitis C antibody  4. Essential (primary) hypertension Continue current medications.   - Lipid panel  5. COPD exacerbation ([place]) Improving but not resolved. Continue 5 more days levaquin and steroid.  - albuterol (PROVENTIL HFA;VENTOLIN HFA) 108 (90 Base) MCG/ACT inhaler; Inhale 2 puffs into the lungs every 6 (six) hours as needed for wheezing or shortness of breath.  Dispense: 1 Inhaler; Refill: 3 - levofloxacin (LEVAQUIN) 500 MG tablet; Take 1 tablet (500 mg total) by mouth daily.  Dispense: 5 tablet; Refill: 0 - predniSONE (DELTASONE) 10 MG tablet; Take 5 for 1 day, 4 for 1 day, 3, for 1 day, 2 for 1 day then 1 for 1 day  Dispense: 15 tablet; Refill: 0  6. History of adenomatous polyp of colon Due colonoscopy 2020  7. Discoid lupus On long term Plaquenil.  - Ambulatory referral to Ophthalmology  8. Shortness of breath   9. Tobacco abuse Intolerant to Chantix and concern that bupropion would exacerbate anxiety.  - nicotine (NICODERM CQ -  DOSED IN MG/24 HOURS) 14 mg/24hr patch; Place 1 patch (14 mg total) onto the skin daily.  Dispense: 28 patch; Refill: 0 Advised we could go up to 21mg  if needed.   10. Chronic seasonal allergic rhinitis due to pollen  - montelukast (SINGULAIR) 10 MG tablet; Take 1 tablet (10 mg total) by mouth at bedtime. Start taking ONE day after finishing prednisone  Dispense: 30 tablet; Refill: 3   Lelon Huh, MD  [facility]

## 2016-06-12 LAB — LIPID PANEL
Chol/HDL Ratio: 4.8 ratio — ABNORMAL HIGH (ref 0.0–4.4)
Cholesterol, Total: 193 mg/dL (ref 100–199)
HDL: 40 mg/dL
LDL Calculated: 121 mg/dL — ABNORMAL HIGH (ref 0–99)
Triglycerides: 159 mg/dL — ABNORMAL HIGH (ref 0–149)
VLDL Cholesterol Cal: 32 mg/dL (ref 5–40)

## 2016-06-12 LAB — HEPATITIS C ANTIBODY: Hep C Virus Ab: <0.1 {s_co_ratio} (ref 0.0–0.9)

## 2016-06-12 NOTE — Progress Notes (Signed)
Advised  ED 

## 2016-06-16 ENCOUNTER — Other Ambulatory Visit: Payer: Self-pay | Admitting: Internal Medicine

## 2016-06-16 DIAGNOSIS — R0602 Shortness of breath: Secondary | ICD-10-CM

## 2016-06-18 ENCOUNTER — Ambulatory Visit: Payer: Medicare Other | Attending: Internal Medicine

## 2016-06-18 DIAGNOSIS — J449 Chronic obstructive pulmonary disease, unspecified: Secondary | ICD-10-CM

## 2016-06-18 DIAGNOSIS — R0602 Shortness of breath: Secondary | ICD-10-CM | POA: Diagnosis not present

## 2016-06-18 MED ORDER — ALBUTEROL SULFATE (2.5 MG/3ML) 0.083% IN NEBU
2.5000 mg | INHALATION_SOLUTION | Freq: Once | RESPIRATORY_TRACT | Status: AC
  Administered 2016-06-18: 2.5 mg via RESPIRATORY_TRACT
  Filled 2016-06-18: qty 3

## 2016-07-02 ENCOUNTER — Ambulatory Visit (INDEPENDENT_AMBULATORY_CARE_PROVIDER_SITE_OTHER): Payer: Medicare Other | Admitting: Gastroenterology

## 2016-07-02 ENCOUNTER — Encounter: Payer: Self-pay | Admitting: Gastroenterology

## 2016-07-02 ENCOUNTER — Other Ambulatory Visit: Payer: Self-pay

## 2016-07-02 VITALS — BP 113/63 | HR 66 | Temp 97.7°F | Resp 16 | Ht 64.0 in | Wt 163.3 lb

## 2016-07-02 DIAGNOSIS — K219 Gastro-esophageal reflux disease without esophagitis: Secondary | ICD-10-CM

## 2016-07-02 DIAGNOSIS — Z8719 Personal history of other diseases of the digestive system: Secondary | ICD-10-CM

## 2016-07-02 NOTE — Progress Notes (Signed)
Gastroenterology Consultation  Referring Provider:     Carmon Ginsberg, [place] Primary Care Physician:  Lelon Huh, MD Primary Gastroenterologist:  Dr. Allen Norris     Reason for Consultation:     GERD        HPI:   Andrea [name] is a 51 y.o. y/o female referred for consultation & management of GERD by Dr. Lelon Huh, MD.  This patient comes today with a history of heartburn. The patient was recently put on Carafate in addition to her twice a day Prevacid. The patient reports that she has tried Nexium Prilosec and Zantac in the past. She continues to have acid breakthrough symptoms. The patient has a history of adenomatous polyps and is due for repeat colonoscopy in 2020. The patient reports that she has regurgitation in addition to her reflux symptoms.  The patient states that her symptoms are much worse if she does not take her medication.  There is no report of any unexplained weight loss.  The patient states that she will sometimes vomit in the evening.  She also reports that her heartburn is most significant at night when she is laying down.  Past Medical History:  Diagnosis Date  . Anxiety    panic attacks, chest pain  . Arrhythmia   . CAD in native artery    a. cardiac cath 01/2014: showed minor irregularities, mildly elevated left ventricular end-diastolic pressure and normal ejection fraction  . PONV (postoperative nausea and vomiting)   . Wears dentures    partial upper and lower    Past Surgical History:  Procedure Laterality Date  . CARDIAC CATHETERIZATION  01/03/14  . CARDIAC CATHETERIZATION    . EYE SURGERY    . IMAGE GUIDED SINUS SURGERY N/A 11/30/2014   Procedure: IMAGE GUIDED SINUS SURGERY;  Surgeon: Carloyn Manner, MD;  Location: [place];  Service: ENT;  Laterality: N/A;  GAVE DISK TO CE CE  . MAXILLARY ANTROSTOMY Bilateral 11/30/2014   Procedure: MAXILLARY ANTROSTOMY;  Surgeon: Carloyn Manner, MD;  Location: [place];  Service: ENT;   Laterality: Bilateral;  . SEPTOPLASTY N/A 11/30/2014   Procedure: SEPTOPLASTY;  Surgeon: Carloyn Manner, MD;  Location: [place];  Service: ENT;  Laterality: N/A;  . SPHENOIDECTOMY Left 11/30/2014   Procedure: Coralee Pesa, ;  Surgeon: Carloyn Manner, MD;  Location: [place];  Service: ENT;  Laterality: Left;  Marland Kitchen VAGINAL HYSTERECTOMY  1995   vaginal    Prior to Admission medications   Medication Sig Start Date End Date Taking? Authorizing Provider  albuterol (PROVENTIL HFA;VENTOLIN HFA) 108 (90 Base) MCG/ACT inhaler Inhale 2 puffs into the lungs every 6 (six) hours as needed for wheezing or shortness of breath. 06/11/16   Birdie Sons, MD  albuterol (PROVENTIL) (2.5 MG/3ML) 0.083% nebulizer solution Take 3 mLs (2.5 mg total) by nebulization every 4 (four) hours as needed for wheezing or shortness of breath. Reported on 03/19/2015 03/19/15   Theodoro Grist, MD  ALPRAZolam Duanne Moron) 1 MG tablet TAKE 1 TABLET BY MOUTH EVERY 8 HOURS AS NEEDED 03/07/16   Birdie Sons, MD  ARIPiprazole (ABILIFY) 5 MG tablet Take 5 mg by mouth daily. Reported on 03/19/2015    Historical Provider, MD  clonazePAM (KLONOPIN) 1 MG tablet TAKE 1 TABLET BY MOUTH TWICE A DAY AS NEEDED 06/10/16   Birdie Sons, MD  hydroxychloroquine (PLAQUENIL) 200 MG tablet Take 2 tablets (400 mg total) by mouth daily. 02/12/16   Birdie Sons, MD  lansoprazole (  PREVACID) 30 MG capsule Take 30 mg by mouth 2 (two) times daily before a meal.    Historical Provider, MD  montelukast (SINGULAIR) 10 MG tablet Take 1 tablet (10 mg total) by mouth at bedtime. Start taking ONE day after finishing prednisone 06/11/16   Birdie Sons, MD  nicotine (NICODERM CQ - DOSED IN MG/24 HOURS) 14 mg/24hr patch Place 1 patch (14 mg total) onto the skin daily. 06/11/16   Birdie Sons, MD  nitroGLYCERIN (NITROSTAT) 0.4 MG SL tablet Place 1 tablet (0.4 mg total) under the tongue every 5 (five) minutes as needed for chest pain. 12/08/14    Wellington Hampshire, MD  Respiratory Therapy Supplies (FLUTTER) DEVI 1 Device by Does not apply route daily. Patient not taking: Reported on 06/11/2016 03/19/15   Theodoro Grist, MD  sucralfate (CARAFATE) 1 g tablet Take 1 tablet (1 g total) by mouth 4 (four) times daily -  with meals and at bedtime. 06/03/16   Carmon Ginsberg, PA  TraZODone HCl 150 MG TB24 Take 150 mg by mouth at bedtime as needed. Reported on 03/19/2015    Historical Provider, MD    Family History  Problem Relation Age of Onset  . Emphysema Father   . Asthma Father   . Heart disease Father   . Heart attack Father   . Arthritis Father     RA  . Colon cancer Father   . Hypertension Mother      Social History  Substance Use Topics  . Smoking status: Current Every Day Smoker    Packs/day: 0.50    Years: 16.00    Types: Cigarettes  . Smokeless tobacco: Never Used     Comment: started smoking 51yo up to 1 ppd  . Alcohol use No    Allergies as of 07/02/2016 - Review Complete 06/18/2016  Allergen Reaction Noted  . Alum & mag hydroxide-simeth Diarrhea and Nausea Only 11/02/2014  . Cefdinir Other (See Comments) 07/06/2013  . Chantix [varenicline]  06/11/2016  . Doxycycline Other (See Comments) 11/18/2013  . Ivp dye [iodinated diagnostic agents] Palpitations 05/20/2013    Review of Systems:    All systems reviewed and negative except where noted in HPI.   Physical Exam:  There were no vitals taken for this visit. No LMP recorded. Patient has had a hysterectomy. Psych:  Alert and cooperative. Normal mood and affect. General:   Alert,  Well-developed, well-nourished, pleasant and cooperative in NAD Head:  Normocephalic and atraumatic. Eyes:  Sclera clear, no icterus.   Conjunctiva pink. Ears:  Normal auditory acuity. Nose:  No deformity, discharge, or lesions. Mouth:  No deformity or lesions,oropharynx pink & moist. Neck:  Supple; no masses or thyromegaly. Lungs:  Respirations even and unlabored.  Clear throughout to  auscultation.   No wheezes, crackles, or rhonchi. No acute distress. Heart:  Regular rate and rhythm; no murmurs, clicks, rubs, or gallops. Abdomen:  Normal bowel sounds.  No bruits.  Soft, non-tender and non-distended without masses, hepatosplenomegaly or hernias noted.  No guarding or rebound tenderness.  Negative Carnett sign.   Rectal:  Deferred.  Msk:  Symmetrical without gross deformities.  Good, equal movement & strength bilaterally. Pulses:  Normal pulses noted. Extremities:  No clubbing or edema.  No cyanosis. Neurologic:  Alert and oriented x3;  grossly normal neurologically. Skin:  Intact without significant lesions or rashes.  No jaundice. Lymph Nodes:  No significant cervical adenopathy. Psych:  Alert and cooperative. Normal mood and affect.  Imaging Studies: Dg  Chest 2 View  Result Date: 06/05/2016 CLINICAL DATA:  Patient reports feeling short of breath. Productive cough with sputum. EXAM: CHEST  2 VIEW COMPARISON:  06/02/2016. FINDINGS: The heart size and mediastinal contours are within normal limits. Both lungs are clear. The visualized skeletal structures are unremarkable. Aortic atherosclerosis. IMPRESSION: No active cardiopulmonary disease.  Stable chest. Electronically Signed   By: Staci Righter M.D.   On: 06/05/2016 16:02   Dg Chest 2 View  Result Date: 06/02/2016 CLINICAL DATA:  Tachycardia and dyspnea since Friday EXAM: CHEST  2 VIEW COMPARISON:  12/02/2015 FINDINGS: The heart size and mediastinal contours are within normal limits. Aortic atherosclerosis at the arch. Uncoiling of the thoracic aorta is noted. No pulmonary consolidation, effusion nor overt pulmonary edema. The visualized skeletal structures are unremarkable. IMPRESSION: No active cardiopulmonary disease.  Aortic atherosclerosis Electronically Signed   By: Ashley Royalty M.D.   On: 06/02/2016 20:40   Nm Pulmonary Perf And Vent  Result Date: 06/06/2016 CLINICAL DATA:  Shortness of breath, cough for 6 days, history  COPD, coronary artery disease, lupus, smoking, IV contrast allergy EXAM: NUCLEAR MEDICINE VENTILATION - PERFUSION LUNG SCAN TECHNIQUE: Ventilation images were obtained in multiple projections using inhaled aerosol Tc-20m DTPA. Perfusion images were obtained in multiple projections after intravenous injection of Tc-20m MAA. RADIOPHARMACEUTICALS:  32.78 mCi technetium-33m DTPA aerosol inhalation and 4.89 mCi technetium-68m MAA IV COMPARISON:  None Radiographic correlation:  Chest radiograph 06/05/2016 FINDINGS: Ventilation: Swallowed aerosol within esophagus. No focal ventilatory defects. Perfusion: Normal Chest radiograph:  Clear lungs IMPRESSION: Normal ventilation and perfusion lung scan. Electronically Signed   By: Lavonia Dana M.D.   On: 06/06/2016 13:28    Assessment and Plan:   Andrea [name] is a 51 y.o. y/o female Who has a history of heartburn refractory to Prevacid twice a day with Carafate.  The patient has also been on Nexium, Prilosec and Zantac in the past.  The patient has been given samples of Dexilant to see if this helps her symptoms.  She will also be set up for an upper endoscopy. I have discussed risks & benefits which include, but are not limited to, bleeding, infection, perforation & drug reaction.  The patient agrees with this plan & written consent will be obtained.       Lucilla Lame, MD. [name]   Note: This dictation was prepared with Dragon dictation along with smaller phrase technology. Any transcriptional errors that result from this process are unintentional.

## 2016-07-04 ENCOUNTER — Encounter: Payer: Self-pay | Admitting: Internal Medicine

## 2016-07-04 ENCOUNTER — Ambulatory Visit (INDEPENDENT_AMBULATORY_CARE_PROVIDER_SITE_OTHER): Payer: Medicare Other | Admitting: Internal Medicine

## 2016-07-04 VITALS — BP 112/72 | HR 94 | Resp 16 | Ht 64.0 in | Wt 162.0 lb

## 2016-07-04 DIAGNOSIS — F411 Generalized anxiety disorder: Secondary | ICD-10-CM | POA: Diagnosis not present

## 2016-07-04 DIAGNOSIS — Z720 Tobacco use: Secondary | ICD-10-CM

## 2016-07-04 DIAGNOSIS — F17210 Nicotine dependence, cigarettes, uncomplicated: Secondary | ICD-10-CM | POA: Diagnosis not present

## 2016-07-04 DIAGNOSIS — J438 Other emphysema: Secondary | ICD-10-CM

## 2016-07-04 MED ORDER — UMECLIDINIUM-VILANTEROL 62.5-25 MCG/INH IN AEPB: 1.0000 | INHALATION_SPRAY | Freq: Every day | RESPIRATORY_TRACT | 5 refills | Status: DC

## 2016-07-04 NOTE — Patient Instructions (Addendum)
--  Will start Anoro inhaler.   --Quitting smoking is the most important thing that you can do for your health.  --Quitting smoking will have greater affect on your health than any medicine that we can give you.

## 2016-07-04 NOTE — Addendum Note (Signed)
Addended by: Oscar La R on: 07/04/2016 12:13 PM   Modules accepted: Orders

## 2016-07-04 NOTE — Progress Notes (Signed)
[facility] Note     Assessment and Plan:  The patient is a 51 year old smoker with moderate COPD, and multiple COPD exacerbations over the past year. Patient also has a significant anxiety component.   Tobacco abuse --Discussed importance of smoking cessation today. Greater than 3 minutes spent in discussion.   COPD, with multiple exacerbations.- --We discussed the results of her Pulmicort function testing which showed an FEV1 of 66%, consistent with moderate COPD. Her symptoms appear to be out of proportion to her moderate COPD, suspect some of this may be due to anxiety, smoking, she may also have an element of GERD which may be contributing. -We'll be started on Anoro once daily. Continue nebulized albuterol as needed. --She stopped Advair because she did not feel that it helped her breathing, she did not develop Symbicort because it was too expensive. Spiriva was not tolerated because it caused a burning sensation in her throat.  Anxiety --Currently klonopin tid, she takes xanax 2 or 3 days per week.  --Uses trazodone to sleep about 1 night per week.  --I suspect this may be playing a role in her dyspnea in addition to emphysema.   Pulmonary nodule 06/2013 CT high res chest> mild centrilobular emphysema but no ILD, multiple scattered pulm nodules all 86mm in size 07/2014 CT chest > nodule unchanged no further follow up recommended.   Daytime somnolence 05/2013 Sleep study [facility] > AHI 3.8  GERD. -Continue Dexilant, continue follow-up with gastroneurology.   HPI:   Andrea [name] is a 51 year old Caucasian female with severe COPD, which appears to be complicated by anxiety. She was admitted to the hospital 10/2015 and 03/2015 for another AECOPD and had an ER visit in April of 2017 for same. It was noted that she had a lung nodule on a CT chest, however, follow-up CT showed no significant difference, no repeat scanning was deemed necessary. Since her last visit she  had another flare up where she was having trouble breathing. She notes that she did better after received a steroid shot, then felt better for 2-3 weeks.   Her smoking is down from 1 ppd to 0.5. She is currently taking albuterol nebulizer. At last visit she was supposed to be started on anoro but was never started.  She is klonopin every day, she is taking xanax about once every 2-3 weeks.  She also notes significant GERD symptoms, is currently taking Dexilant. She is following up with gastroenterology.  **Review of tracings personally, 06/18/16 PFT: FVC is 82%, FEV1 is a 66% without significant reversibility, ratio is 60%. Lung volumes are unremarkable. Diffusion capacity is 57% of predicted. Flow volume loop is consistent with obstruction. Overall, this testing is consistent with moderate objective lung disease with an FEV1 of 60%.  **Chest x-ray PA and lateral views 06/19/14 personally reviewed; showed emphysematous changes, otherwise unremarkable with hyperinflated lungs, minimal difference in comparison with previous films.  CT chest from 06/10/2013 was reviewed that showed some scattered emphysematous changes but otherwise unremarkable without evidence of bronchiectasis.  Baseline O2 was 96% and HR 87 at rest on RA. After walking 600 feet sat was 94% and HR 98 on RA. Pt complained of mild dyspnea, but appeared comfortable and was speaking full sentences.   Social Hx:   Social History  Substance Use Topics  . Smoking status: Current Every Day Smoker    Packs/day: 0.50    Years: 16.00    Types: Cigarettes  . Smokeless tobacco: Never Used  Comment: started smoking 51yo up to 1 ppd  . Alcohol use Not on file   Medication:    Reviewed   Allergies:  Alum & mag hydroxide-simeth; Cefdinir; Chantix [varenicline]; Doxycycline; and Ivp dye [iodinated diagnostic agents]  Review of Systems: Gen:  Denies  fever, sweats, chills HEENT: Denies blurred vision, double vision, ear pain, eye pain,  hearing loss, nose bleeds, sore throat Cvc:  No dizziness, chest pain or heaviness Resp:   Denies cough or sputum porduction, shortness of breath Gi: Denies swallowing difficulty, stomach pain, nausea or vomiting, diarrhea, constipation, bowel incontinence Gu:  Denies bladder incontinence, burning urine Ext:   No Joint pain, stiffness or swelling Skin: No skin rash, easy bruising or bleeding or hives Endoc:  No polyuria, polydipsia , polyphagia or weight change Psych: No depression, insomnia or hallucinations  Other:  All other systems negative  Physical Examination:   VS: BP 112/72 (BP Location: Left Arm, Patient Position: Sitting, Cuff Size: Normal)   Pulse 94   Resp 16   Ht 5\' 4"  (1.626 m)   Wt 162 lb (73.5 kg)   SpO2 95%   BMI 27.81 kg/m   General Appearance: No distress  Neuro:without focal findings,  speech normal,  HEENT: PERRLA, EOM intact.   Pulmonary: normal breath sounds, No wheezing, No rales;    CardiovascularNormal S1,S2.  No m/r/g.   Abdomen: Benign, Soft, non-tender. Renal:  No costovertebral tenderness  GU:  No performed at this time. Endoc: No evident thyromegaly Skin:   warm, no rashes, no ecchymosis  Extremities: normal, no cyanosis, clubbing.   Labs results:   Rad results:     Other:      Orders for this visit: No orders of the defined types were placed in this encounter.    Thank  you for the consultation and for allowing [facility] to assist in the care of your patient. Our recommendations are noted above.  Please contact us if we can be of further service.   Marda Stalker, MD.  Board Certified in Internal Medicine, Pulmonary Medicine, [place], and Sleep Medicine.  [facility] Office Number: [phone]  Patricia Pesa, M.D.  Vilinda Boehringer, M.D.  Cheral Marker, M.D

## 2016-07-11 ENCOUNTER — Ambulatory Visit: Payer: Medicare Other | Admitting: Anesthesiology

## 2016-07-11 ENCOUNTER — Encounter
Admission: RE | Disposition: A | Discharge status: Home / Self Care | Payer: Self-pay | Source: Ambulatory Visit | Attending: Gastroenterology

## 2016-07-11 ENCOUNTER — Ambulatory Visit
Admission: RE | Admit: 2016-07-11 | Discharge: 2016-07-11 | Disposition: A | Discharge status: Home / Self Care | Payer: Medicare Other | Source: Ambulatory Visit | Attending: Gastroenterology | Admitting: Gastroenterology

## 2016-07-11 DIAGNOSIS — I10 Essential (primary) hypertension: Secondary | ICD-10-CM | POA: Diagnosis not present

## 2016-07-11 DIAGNOSIS — J449 Chronic obstructive pulmonary disease, unspecified: Secondary | ICD-10-CM | POA: Diagnosis not present

## 2016-07-11 DIAGNOSIS — F419 Anxiety disorder, unspecified: Secondary | ICD-10-CM | POA: Insufficient documentation

## 2016-07-11 DIAGNOSIS — M1990 Unspecified osteoarthritis, unspecified site: Secondary | ICD-10-CM | POA: Diagnosis not present

## 2016-07-11 DIAGNOSIS — F17210 Nicotine dependence, cigarettes, uncomplicated: Secondary | ICD-10-CM | POA: Diagnosis not present

## 2016-07-11 DIAGNOSIS — K449 Diaphragmatic hernia without obstruction or gangrene: Secondary | ICD-10-CM | POA: Diagnosis not present

## 2016-07-11 DIAGNOSIS — Z79899 Other long term (current) drug therapy: Secondary | ICD-10-CM | POA: Insufficient documentation

## 2016-07-11 DIAGNOSIS — I2510 Atherosclerotic heart disease of native coronary artery without angina pectoris: Secondary | ICD-10-CM | POA: Insufficient documentation

## 2016-07-11 DIAGNOSIS — K219 Gastro-esophageal reflux disease without esophagitis: Secondary | ICD-10-CM | POA: Diagnosis not present

## 2016-07-11 HISTORY — PX: ESOPHAGOGASTRODUODENOSCOPY (EGD) WITH PROPOFOL: SHX5813

## 2016-07-11 HISTORY — DX: Dyspnea, unspecified: R06.00

## 2016-07-11 SURGERY — ESOPHAGOGASTRODUODENOSCOPY (EGD) WITH PROPOFOL
Anesthesia: Monitor Anesthesia Care | Wound class: Clean Contaminated

## 2016-07-11 MED ORDER — ACETAMINOPHEN 160 MG/5ML PO SOLN: 325.0000 mg | ORAL | Status: DC | PRN

## 2016-07-11 MED ORDER — ACETAMINOPHEN 325 MG PO TABS: 325.0000 mg | ORAL_TABLET | ORAL | Status: DC | PRN

## 2016-07-11 MED ORDER — LACTATED RINGERS IV SOLN
10.0000 mL/h | INTRAVENOUS | Status: DC
  Administered 2016-07-11: 10 mL/h via INTRAVENOUS

## 2016-07-11 MED ORDER — LIDOCAINE HCL (CARDIAC) 20 MG/ML IV SOLN
INTRAVENOUS | Status: DC | PRN
  Administered 2016-07-11: 50 mg via INTRAVENOUS

## 2016-07-11 MED ORDER — PROPOFOL 10 MG/ML IV BOLUS
INTRAVENOUS | Status: DC | PRN
  Administered 2016-07-11: 10 mg via INTRAVENOUS
  Administered 2016-07-11: 20 mg via INTRAVENOUS
  Administered 2016-07-11: 70 mg via INTRAVENOUS

## 2016-07-11 MED ORDER — GLYCOPYRROLATE 0.2 MG/ML IJ SOLN
INTRAMUSCULAR | Status: DC | PRN
  Administered 2016-07-11: 0.2 mg via INTRAVENOUS

## 2016-07-11 MED ORDER — ONDANSETRON HCL 4 MG/2ML IJ SOLN: 4.0000 mg | Freq: Once | INTRAMUSCULAR | Status: DC | PRN

## 2016-07-11 SURGICAL SUPPLY — 32 items
BALLN DILATOR 10-12 8 (BALLOONS)
BALLN DILATOR 12-15 8 (BALLOONS)
BALLN DILATOR 15-18 8 (BALLOONS)
BALLN DILATOR CRE 0-12 8 (BALLOONS)
BALLN DILATOR ESOPH 8 10 CRE (MISCELLANEOUS) IMPLANT
BALLOON DILATOR 12-15 8 (BALLOONS) IMPLANT
BALLOON DILATOR 15-18 8 (BALLOONS) IMPLANT
BALLOON DILATOR CRE 0-12 8 (BALLOONS) IMPLANT
BLOCK BITE 60FR ADLT L/F GRN (MISCELLANEOUS) ×2 IMPLANT
CANISTER SUCT 1200ML W/VALVE (MISCELLANEOUS) ×2 IMPLANT
CLIP HMST 235XBRD CATH ROT (MISCELLANEOUS) IMPLANT
CLIP RESOLUTION 360 11X235 (MISCELLANEOUS)
FCP ESCP3.2XJMB 240X2.8X (MISCELLANEOUS)
FORCEPS BIOP RAD 4 LRG CAP 4 (CUTTING FORCEPS) IMPLANT
FORCEPS BIOP RJ4 240 W/NDL (MISCELLANEOUS)
FORCEPS ESCP3.2XJMB 240X2.8X (MISCELLANEOUS) IMPLANT
GOWN CVR UNV OPN BCK APRN NK (MISCELLANEOUS) ×2 IMPLANT
GOWN ISOL THUMB LOOP REG UNIV (MISCELLANEOUS) ×2
INJECTOR VARIJECT VIN23 (MISCELLANEOUS) IMPLANT
KIT DEFENDO VALVE AND CONN (KITS) IMPLANT
KIT ENDO PROCEDURE OLY (KITS) ×2 IMPLANT
MARKER SPOT ENDO TATTOO 5ML (MISCELLANEOUS) IMPLANT
PAD GROUND ADULT SPLIT (MISCELLANEOUS) IMPLANT
RETRIEVER NET PLAT FOOD (MISCELLANEOUS) IMPLANT
SNARE SHORT THROW 13M SML OVAL (MISCELLANEOUS) IMPLANT
SNARE SHORT THROW 30M LRG OVAL (MISCELLANEOUS) IMPLANT
SPOT EX ENDOSCOPIC TATTOO (MISCELLANEOUS)
SYR INFLATION 60ML (SYRINGE) IMPLANT
TRAP ETRAP POLY (MISCELLANEOUS) IMPLANT
VARIJECT INJECTOR VIN23 (MISCELLANEOUS)
WATER STERILE IRR 250ML POUR (IV SOLUTION) ×2 IMPLANT
WIRE CRE 18-20MM 8CM F G (MISCELLANEOUS) IMPLANT

## 2016-07-11 NOTE — Anesthesia Postprocedure Evaluation (Signed)
Anesthesia Post Note  Patient: Andrea [name]  Procedure(s) Performed: Procedure(s) (LRB): ESOPHAGOGASTRODUODENOSCOPY (EGD) WITH PROPOFOL (N/A)  Patient location during evaluation: PACU Anesthesia Type: MAC Level of consciousness: awake and alert Pain management: pain level controlled Vital Signs Assessment: post-procedure vital signs reviewed and stable Respiratory status: spontaneous breathing, nonlabored ventilation and respiratory function stable Cardiovascular status: stable Postop Assessment: no signs of nausea or vomiting Anesthetic complications: no    Veda Canning

## 2016-07-11 NOTE — Anesthesia Procedure Notes (Signed)
Procedure Name: MAC Performed by: Mayme Genta Pre-anesthesia Checklist: Patient identified, Emergency Drugs available, Suction available, Timeout performed and Patient being monitored Patient Re-evaluated:Patient Re-evaluated prior to inductionOxygen Delivery Method: Nasal cannula Placement Confirmation: positive ETCO2

## 2016-07-11 NOTE — Anesthesia Preprocedure Evaluation (Signed)
Anesthesia Evaluation  Patient identified by MRN, date of birth, ID band  History of Anesthesia Complications (+) PONV and history of anesthetic complications  Airway Mallampati: II  TM Distance: >3 FB Neck ROM: Full    Dental   Pulmonary shortness of breath, COPD,  COPD inhaler, Current Smoker, former smoker,    breath sounds clear to auscultation       Cardiovascular hypertension, + CAD       Neuro/Psych PSYCHIATRIC DISORDERS Anxiety Depression    GI/Hepatic GERD  ,  Endo/Other    Renal/GU      Musculoskeletal  (+) Arthritis , Osteoarthritis,  Fibromyalgia -  Abdominal   Peds  Hematology   Anesthesia Other Findings   Reproductive/Obstetrics                             Anesthesia Physical  Anesthesia Plan  ASA: III  Anesthesia Plan: MAC   Post-op Pain Management:    Induction: Intravenous  Airway Management Planned: Nasal Cannula  Additional Equipment:   Intra-op Plan:   Post-operative Plan:   Informed Consent: I have reviewed the patients History and Physical, chart, labs and discussed the procedure including the risks, benefits and alternatives for the proposed anesthesia with the patient or authorized representative who has indicated his/her understanding and acceptance.     Plan Discussed with: CRNA  Anesthesia Plan Comments:         Anesthesia Quick Evaluation

## 2016-07-11 NOTE — Op Note (Signed)
[facility] Gastroenterology Patient Name: Andrea [name] Procedure Date: 07/11/2016 7:45 AM MRN: [id number] Account #: [id number] Date of Birth: [birth year] Admit Type: Outpatient Age: 51 Room: [facility] OR ROOM 01 Gender: Female Note Status: Finalized Procedure:            Upper GI endoscopy Indications:          Heartburn Providers:            Lucilla Lame MD, MD Referring MD:         Kirstie Peri. Caryn Section, MD (Referring MD) Medicines:            Propofol per Anesthesia Complications:        No immediate complications. Procedure:            Pre-Anesthesia Assessment:                       - Prior to the procedure, a History and Physical was                        performed, and patient medications and allergies were                        reviewed. The patient's tolerance of previous                        anesthesia was also reviewed. The risks and benefits of                        the procedure and the sedation options and risks were                        discussed with the patient. All questions were                        answered, and informed consent was obtained. Prior                        Anticoagulants: The patient has taken no previous                        anticoagulant or antiplatelet agents. ASA Grade                        Assessment: II - A patient with mild systemic disease.                        After reviewing the risks and benefits, the patient was                        deemed in satisfactory condition to undergo the                        procedure.                       After obtaining informed consent, the endoscope was                        passed under direct vision. Throughout the procedure,  the patient's blood pressure, pulse, and oxygen                        saturations were monitored continuously. The Olympus                        190 Endoscope [id number]) was introduced through the                        mouth,  and advanced to the second part of duodenum. The                        upper GI endoscopy was accomplished without difficulty.                        The patient tolerated the procedure well. Findings:      A small hiatal hernia was present.      The stomach was normal.      The examined duodenum was normal. Impression:           - Small hiatal hernia.                       - Normal stomach.                       - Normal examined duodenum.                       - No specimens collected. Recommendation:       - Discharge patient to home.                       - Resume previous diet.                       - Continue present medications.                       - Perform ambulatory pH monitoring. Procedure Code(s):    --- Professional ---                       [phone], Esophagogastroduodenoscopy, flexible, transoral;                        diagnostic, including collection of specimen(s) by                        brushing or washing, when performed (separate procedure) Diagnosis Code(s):    --- Professional ---                       R12, Heartburn CPT copyright 2016 American Medical Association. All rights reserved. The codes documented in this report are preliminary and upon coder review may  be revised to meet current compliance requirements. Lucilla Lame MD, MD 07/11/2016 7:59:35 AM This report has been signed electronically. Number of Addenda: 0 Note Initiated On: 07/11/2016 7:45 AM      [facility]

## 2016-07-11 NOTE — Transfer of Care (Signed)
Immediate Anesthesia Transfer of Care Note  Patient: Andrea [name]  Procedure(s) Performed: Procedure(s): ESOPHAGOGASTRODUODENOSCOPY (EGD) WITH PROPOFOL (N/A)  Patient Location: PACU  Anesthesia Type: MAC  Level of Consciousness: awake, alert  and patient cooperative  Airway and Oxygen Therapy: Patient Spontanous Breathing and Patient connected to supplemental oxygen  Post-op Assessment: Post-op Vital signs reviewed, Patient's Cardiovascular Status Stable, Respiratory Function Stable, Patent Airway and No signs of Nausea or vomiting  Post-op Vital Signs: Reviewed and stable  Complications: No apparent anesthesia complications

## 2016-07-11 NOTE — H&P (Signed)
Lucilla Lame, MD [address] [place], [place] [address] [phone] Fax : [phone]  Primary Care Physician:  Birdie Sons, MD Primary Gastroenterologist:  Dr. Allen Norris  Pre-Procedure History & Physical: HPI:  Andrea [name] is a 51 y.o. female is here for an endoscopy.   Past Medical History:  Diagnosis Date  . Anginal pain ([place])   . Anxiety    panic attacks, chest pain  . Arrhythmia   . Arthritis   . CAD in native artery    a. cardiac cath 01/2014: showed minor irregularities, mildly elevated left ventricular end-diastolic pressure and normal ejection fraction  . COPD (chronic obstructive pulmonary disease) ([place])   . Dyspnea   . GERD (gastroesophageal reflux disease)   . Hypertension    not medicated  . PONV (postoperative nausea and vomiting)   . Wears dentures    partial upper and lower    Past Surgical History:  Procedure Laterality Date  . CARDIAC CATHETERIZATION  01/03/14  . CARDIAC CATHETERIZATION    . EYE SURGERY    . IMAGE GUIDED SINUS SURGERY N/A 11/30/2014   Procedure: IMAGE GUIDED SINUS SURGERY;  Surgeon: Carloyn Manner, MD;  Location: [facility];  Service: ENT;  Laterality: N/A;  GAVE DISK TO CE CE  . MAXILLARY ANTROSTOMY Bilateral 11/30/2014   Procedure: MAXILLARY ANTROSTOMY;  Surgeon: Carloyn Manner, MD;  Location: [facility];  Service: ENT;  Laterality: Bilateral;  . SEPTOPLASTY N/A 11/30/2014   Procedure: SEPTOPLASTY;  Surgeon: Carloyn Manner, MD;  Location: [facility];  Service: ENT;  Laterality: N/A;  . SPHENOIDECTOMY Left 11/30/2014   Procedure: Coralee Pesa, ;  Surgeon: Carloyn Manner, MD;  Location: [facility];  Service: ENT;  Laterality: Left;  Marland Kitchen VAGINAL HYSTERECTOMY  1995   vaginal    Prior to Admission medications   Medication Sig Start Date End Date Taking? Authorizing Provider  dexlansoprazole (DEXILANT) 60 MG capsule Take 60 mg by mouth daily.   Yes [provider]  albuterol (PROVENTIL HFA;VENTOLIN HFA) 108 (90 Base) MCG/ACT inhaler Inhale 2 puffs into the lungs every 6 (six) hours as needed for wheezing or shortness of breath. 06/11/16   Birdie Sons, MD  albuterol (PROVENTIL) (2.5 MG/3ML) 0.083% nebulizer solution Take 3 mLs (2.5 mg total) by nebulization every 4 (four) hours as needed for wheezing or shortness of breath. Reported on 03/19/2015 03/19/15   Theodoro Grist, MD  ALPRAZolam Duanne Moron) 1 MG tablet TAKE 1 TABLET BY MOUTH EVERY 8 HOURS AS NEEDED 03/07/16   Birdie Sons, MD  ARIPiprazole (ABILIFY) 5 MG tablet Take 5 mg by mouth daily. Reported on 03/19/2015    [provider]  clonazePAM (KLONOPIN) 1 MG tablet TAKE 1 TABLET BY MOUTH TWICE A DAY AS NEEDED 06/10/16   Birdie Sons, MD  hydroxychloroquine (PLAQUENIL) 200 MG tablet Take 2 tablets (400 mg total) by mouth daily. 02/12/16   Birdie Sons, MD  montelukast (SINGULAIR) 10 MG tablet Take 1 tablet (10 mg total) by mouth at bedtime. Start taking ONE day after finishing prednisone 06/11/16   Birdie Sons, MD  nicotine (NICODERM CQ - DOSED IN MG/24 HOURS) 14 mg/24hr patch Place 1 patch (14 mg total) onto the skin daily. Patient not taking: Reported on 07/04/2016 06/11/16   Birdie Sons, MD  nitroGLYCERIN (NITROSTAT) 0.4 MG SL tablet Place 1 tablet (0.4 mg total) under the tongue every 5 (five) minutes as needed for chest pain. 12/08/14   Fletcher Anon,  Mertie Clause, MD  Respiratory Therapy Supplies (FLUTTER) DEVI 1 Device by Does not apply route daily. 03/19/15   Theodoro Grist, MD  sucralfate (CARAFATE) 1 g tablet Take 1 tablet (1 g total) by mouth 4 (four) times daily -  with meals and at bedtime. 06/03/16   Carmon Ginsberg, PA  TraZODone HCl 150 MG TB24 Take 150 mg by mouth at bedtime as needed. Reported on 03/19/2015    [provider]  umeclidinium-vilanterol (ANORO ELLIPTA) 62.5-25 MCG/INH AEPB Inhale 1 puff into the lungs daily. 07/04/16   Laverle Hobby, MD     Allergies as of 07/02/2016 - Review Complete 07/02/2016  Allergen Reaction Noted  . Alum & mag hydroxide-simeth Diarrhea and Nausea Only 11/02/2014  . Cefdinir Other (See Comments) 07/06/2013  . Chantix [varenicline]  06/11/2016  . Doxycycline Other (See Comments) 11/18/2013  . Ivp dye [iodinated diagnostic agents] Palpitations 05/20/2013    Family History  Problem Relation Age of Onset  . Emphysema Father   . Asthma Father   . Heart disease Father   . Heart attack Father   . Arthritis Father        RA  . Colon cancer Father   . Hypertension Mother     Social History   Social History  . Marital status: Legally Separated    Spouse name: N/A  . Number of children: 3  . Years of education: N/A   Occupational History  . Disabled     Social Security as of 05/21/2011   Social History Main Topics  . Smoking status: Current Every Day Smoker    Packs/day: 0.50    Years: 16.00    Types: Cigarettes  . Smokeless tobacco: Never Used     Comment: started smoking 51yo up to 1 ppd  . Alcohol use No  . Drug use: No  . Sexual activity: Not on file   Other Topics Concern  . Not on file   Social History Narrative   Lives at home with husband. Independent at baseline.    Review of Systems: See HPI, otherwise negative ROS  Physical Exam: Ht 5\' 3"  (1.6 m)   Wt 160 lb (72.6 kg)   BMI 28.34 kg/m  General:   Alert,  pleasant and cooperative in NAD Head:  Normocephalic and atraumatic. Neck:  Supple; no masses or thyromegaly. Lungs:  Clear throughout to auscultation.    Heart:  Regular rate and rhythm. Abdomen:  Soft, nontender and nondistended. Normal bowel sounds, without guarding, and without rebound.   Neurologic:  Alert and  oriented x4;  grossly normal neurologically.  Impression/Plan: Andrea [name] is here for an endoscopy to be performed for GERD  Risks, benefits, limitations, and alternatives regarding  endoscopy have been reviewed with the patient.   Questions have been answered.  All parties agreeable.   Lucilla Lame, MD  07/11/2016, 7:23 AM

## 2016-07-12 ENCOUNTER — Encounter: Payer: Self-pay | Admitting: Gastroenterology

## 2016-07-22 ENCOUNTER — Telehealth: Payer: Self-pay | Admitting: Gastroenterology

## 2016-07-22 NOTE — Telephone Encounter (Signed)
Patient left a voice message that she is out of Dexilant and there is another test that she is suppose to have.

## 2016-07-24 ENCOUNTER — Other Ambulatory Visit: Payer: Self-pay

## 2016-07-24 MED ORDER — DEXLANSOPRAZOLE 60 MG PO CPDR: 60.0000 mg | DELAYED_RELEASE_CAPSULE | Freq: Every day | ORAL | 6 refills | Status: DC

## 2016-07-24 NOTE — Telephone Encounter (Signed)
Patient left a voice message that she is still waiting on you to call her back regarding reflux medication.

## 2016-07-24 NOTE — Telephone Encounter (Signed)
Returned pt's call and she stated she had a PH study done a few years ago at [facility] and due to her COPD and coughing she is not sure she could tolerate the tube down her nose. She would like you to review the [place] study from there to see if she would still need it.   Rx for Dexilant sent to her pharmacy.

## 2016-07-25 ENCOUNTER — Encounter: Payer: Self-pay | Admitting: Emergency Medicine

## 2016-07-25 ENCOUNTER — Emergency Department
Admission: EM | Admit: 2016-07-25 | Discharge: 2016-07-25 | Disposition: A | Discharge status: Home / Self Care | Payer: Medicare Other | Attending: Emergency Medicine | Admitting: Emergency Medicine

## 2016-07-25 ENCOUNTER — Emergency Department: Payer: Medicare Other

## 2016-07-25 ENCOUNTER — Telehealth: Payer: Self-pay | Admitting: Cardiovascular Disease

## 2016-07-25 DIAGNOSIS — I10 Essential (primary) hypertension: Secondary | ICD-10-CM | POA: Diagnosis not present

## 2016-07-25 DIAGNOSIS — R002 Palpitations: Secondary | ICD-10-CM

## 2016-07-25 DIAGNOSIS — Z79899 Other long term (current) drug therapy: Secondary | ICD-10-CM | POA: Diagnosis not present

## 2016-07-25 DIAGNOSIS — R42 Dizziness and giddiness: Secondary | ICD-10-CM | POA: Diagnosis not present

## 2016-07-25 DIAGNOSIS — I2510 Atherosclerotic heart disease of native coronary artery without angina pectoris: Secondary | ICD-10-CM | POA: Insufficient documentation

## 2016-07-25 DIAGNOSIS — F17210 Nicotine dependence, cigarettes, uncomplicated: Secondary | ICD-10-CM | POA: Insufficient documentation

## 2016-07-25 DIAGNOSIS — R0602 Shortness of breath: Secondary | ICD-10-CM | POA: Diagnosis not present

## 2016-07-25 DIAGNOSIS — I493 Ventricular premature depolarization: Secondary | ICD-10-CM | POA: Insufficient documentation

## 2016-07-25 LAB — BASIC METABOLIC PANEL
Anion gap: 9 (ref 5–15)
BUN: 12 mg/dL (ref 6–20)
CO2: 28 mmol/L (ref 22–32)
CREATININE: 0.81 mg/dL (ref 0.44–1.00)
Calcium: 9.5 mg/dL (ref 8.9–10.3)
Chloride: 101 mmol/L (ref 101–111)
GFR calc Af Amer: >60 mL/min (ref >60)
GLUCOSE: 110 mg/dL — AB (ref 65–99)
POTASSIUM: 3.7 mmol/L (ref 3.5–5.1)
SODIUM: 138 mmol/L (ref 135–145)

## 2016-07-25 LAB — CBC
HCT: 43.1 % (ref 35.0–47.0)
Hemoglobin: 14.6 g/dL (ref 12.0–16.0)
MCH: 26.8 pg (ref 26.0–34.0)
MCHC: 33.7 g/dL (ref 32.0–36.0)
MCV: 79.5 fL — ABNORMAL LOW (ref 80.0–100.0)
PLATELETS: 240 10*3/uL (ref 150–440)
RBC: 5.42 MIL/uL — ABNORMAL HIGH (ref 3.80–5.20)
RDW: 14.8 % — AB (ref 11.5–14.5)
WBC: 8.8 10*3/uL (ref 3.6–11.0)

## 2016-07-25 LAB — TROPONIN I: Troponin I: <0.03 ng/mL (ref <0.03)

## 2016-07-25 LAB — BRAIN NATRIURETIC PEPTIDE: B Natriuretic Peptide: 22 pg/mL (ref 0.0–100.0)

## 2016-07-25 LAB — MAGNESIUM: MAGNESIUM: 2 mg/dL (ref 1.7–2.4)

## 2016-07-25 LAB — TSH: TSH: 2.146 u[IU]/mL (ref 0.350–4.500)

## 2016-07-25 MED ORDER — SODIUM CHLORIDE 0.9 % IV BOLUS (SEPSIS)
1000.0000 mL | Freq: Once | INTRAVENOUS | Status: AC
  Administered 2016-07-25: 1000 mL via INTRAVENOUS

## 2016-07-25 MED ORDER — GI COCKTAIL ~~LOC~~
30.0000 mL | Freq: Once | ORAL | Status: AC
  Administered 2016-07-25: 30 mL via ORAL
  Filled 2016-07-25: qty 30

## 2016-07-25 MED ORDER — ASPIRIN 81 MG PO CHEW
324.0000 mg | CHEWABLE_TABLET | Freq: Once | ORAL | Status: AC
  Administered 2016-07-25: 324 mg via ORAL
  Filled 2016-07-25: qty 4

## 2016-07-25 NOTE — Telephone Encounter (Signed)
Pt calling stating for about 2-3 weeks every time she eat about 15-20 minutes in her Heart beat starts pounding really loud. along with SOB At night her heart keeps her up  She thought it may be something along with her COPD but not sure  Would like to know if its something else.   She is scheduled to see Ignacia Bayley on June 19 th (That was the soonest she could do)

## 2016-07-25 NOTE — Telephone Encounter (Signed)
Returned call to patient. Patient started having palpitations that would wake her up at night out of her sleep, described as "feels like my heart is about to pop out of my chest." It would also occur after eating or drinking a few times a week. Over the past week, its been happening more to the point it is on and off all day. She can only eat one meal a day because it happens every time she eats or drinks even a little bit. She gets SOB with it. She's had one time where she felt like she was going to pass out several days ago. Denies chest pain or dizziness. She does not take her BP. In the hospital from 06/05/16 to 06/06/16 for COPD exacerbation. Patient says she was diagnosed with an arrhythmia in the past and wore a monitor. (I cannot find the results in EPIC). Patient last seen by Dr Fletcher Anon on 12/2014 and was to f/u on as needed basis.  Advised patient if symptoms worsen or she becomes faint or passes out to call 911 and she verbalized understanding. Will route to [employer], PA who previously advised a ZIO monitor may be appropriate for patient to verify treatment plan.

## 2016-07-25 NOTE — Discharge Instructions (Signed)
Please drink plenty of fluid to stay well-hydrated. Please do not stand up suddenly, take your time to prevent lightheadedness.  Please keep your appointment with your cardiologist tomorrow for further evaluation.  Return to the emergency department if you develop severe pain, lightheadedness or fainting, chest pain, or any other symptoms concerning to you.

## 2016-07-25 NOTE — Telephone Encounter (Signed)
Spoke with patient and she verbalized understanding of getting lab work tomorrow at [facility] in the morning and to come to appt 07/26/16 at 0800 at the office for ZIO patch.

## 2016-07-25 NOTE — ED Triage Notes (Signed)
Pt reports palpitations and shortness of breath for two weeks. Pt states she feels her heart bounding out of her chest. Pt in no apparent distress in triage.

## 2016-07-25 NOTE — ED Provider Notes (Signed)
[facility] Emergency Department Provider Note  ____________________________________________  Time seen: Approximately 6:59 PM  I have reviewed the triage vital signs and the nursing notes.   HISTORY  Chief Complaint Shortness of Breath and Palpitations    HPI Andrea [name] is a 51 y.o. female with a history of hypertension not on any medications, CAD, GERD presenting with palpitations and shortness of breath. The patient reports that over the past 3 weeks, she has had progressively more constant left-sided heart palpitations "like my heart is going to fly out of my chest." When "my heart races, I become short of breath." She has occasional associated lightheadedness but no syncope. No diaphoresis. Occasional associated nausea. The symptoms are significantly worse with eating, and now she is down to 1 meal per day, which exacerbates her palpitations. She denies any chest pain, tightness or pressure. No lower extremity swelling or calf pain. The patient was having chest pain in 2015 and underwent cardiac catheterization showing "minor irregularities, mildly elevated left ventricular end-diastolic pressure and normal ejection fraction." The patient states that she continues to be symptomatic at this time. The patient is scheduled to see Dr. Velva Harman tomorrow to have Holter placed, but states her symptoms were worse today so she came in for further evaluation   Past Medical History:  Diagnosis Date  . Anginal pain ([place])   . Anxiety    panic attacks, chest pain  . Arrhythmia   . Arthritis   . CAD in native artery    a. cardiac cath 01/2014: showed minor irregularities, mildly elevated left ventricular end-diastolic pressure and normal ejection fraction  . COPD (chronic obstructive pulmonary disease) ([place])   . Dyspnea   . GERD (gastroesophageal reflux disease)   . Hypertension    not medicated  . PONV (postoperative nausea and vomiting)   . Wears dentures    partial  upper and lower    Patient Active Problem List   Diagnosis Date Noted  . Allergic rhinitis 06/11/2016  . Hyperlipidemia 03/19/2015  . COPD exacerbation ([place]) 10/04/2014  . Anxiety 09/22/2014  . Depression 09/22/2014  . Dyshidrosis 09/22/2014  . GERD (gastroesophageal reflux disease) 09/22/2014  . Insomnia 09/22/2014  . Lung nodule, multiple 09/22/2014  . Panic disorder 09/22/2014  . Palpitations 01/17/2014  . Pulmonary nodule 06/10/2013  . Shortness of breath 05/20/2013  . COPD, GOLD B 05/20/2013  . Tobacco abuse 05/20/2013  . Daytime somnolence 05/20/2013  . Back pain 08/28/2012  . Discoid lupus 05/30/2012  . Cellulitis and abscess of face 05/30/2012  . Generalized abdominal pain 03/11/2012  . Pain in joint 03/09/2012  . Psoriasis 02/21/2012  . Fibromyalgia 02/11/2012  . Lupus 02/11/2012  . Panic attacks 02/11/2012  . History of adenomatous polyp of colon 09/13/2011  . Heartburn 08/06/2011  . PVC's (premature ventricular contractions) 01/29/2011  . Essential (primary) hypertension 03/04/1998    Past Surgical History:  Procedure Laterality Date  . CARDIAC CATHETERIZATION  01/03/14  . CARDIAC CATHETERIZATION    . ESOPHAGOGASTRODUODENOSCOPY (EGD) WITH PROPOFOL N/A 07/11/2016   Procedure: ESOPHAGOGASTRODUODENOSCOPY (EGD) WITH PROPOFOL;  Surgeon: Lucilla Lame, MD;  Location: [place];  Service: Endoscopy;  Laterality: N/A;  . EYE SURGERY    . IMAGE GUIDED SINUS SURGERY N/A 11/30/2014   Procedure: IMAGE GUIDED SINUS SURGERY;  Surgeon: Carloyn Manner, MD;  Location: [place];  Service: ENT;  Laterality: N/A;  GAVE DISK TO CE CE  . MAXILLARY ANTROSTOMY Bilateral 11/30/2014   Procedure: MAXILLARY ANTROSTOMY;  Surgeon:  Carloyn Manner, MD;  Location: [place];  Service: ENT;  Laterality: Bilateral;  . SEPTOPLASTY N/A 11/30/2014   Procedure: SEPTOPLASTY;  Surgeon: Carloyn Manner, MD;  Location: [place];  Service: ENT;  Laterality:  N/A;  . SPHENOIDECTOMY Left 11/30/2014   Procedure: Coralee Pesa, ;  Surgeon: Carloyn Manner, MD;  Location: [place];  Service: ENT;  Laterality: Left;  Marland Kitchen VAGINAL HYSTERECTOMY  1995   vaginal    Current Outpatient Rx  . Order #: [id number] Class: Normal  . Order #: [id number] Class: Normal  . Order #: [id number] Class: Historical Med  . Order #: [id number] Class: Phone In  . Order #: [id number] Class: Normal  . Order #: [id number] Class: Normal  . Order #: [id number] Class: Normal  . Order #: [id number] Class: Normal  . Order #: [id number] Class: Normal  . Order #: [id number] Class: Historical Med  . Order #: [id number] Class: Normal  . Order #: [id number] Class: Phone In  . Order #: [id number] Class: Normal  . Order #: [id number] Class: Print    Allergies Alum & mag hydroxide-simeth; Cefdinir; Chantix [varenicline]; Doxycycline; and Ivp dye [iodinated diagnostic agents]  Family History  Problem Relation Age of Onset  . Emphysema Father   . Asthma Father   . Heart disease Father   . Heart attack Father   . Arthritis Father        RA  . Colon cancer Father   . Hypertension Mother     Social History Social History  Substance Use Topics  . Smoking status: Current Every Day Smoker    Packs/day: 0.50    Years: 16.00    Types: Cigarettes  . Smokeless tobacco: Never Used     Comment: started smoking 51yo up to 1 ppd  . Alcohol use No    Review of Systems Constitutional: No fever/chills. Occasional lightheadedness. No syncope. Eyes: No visual changes. ENT:  No congestion or rhinorrhea. Cardiovascular: Denies chest pain. Positive palpitations. Respiratory: Positive shortness of breath.  No cough. Gastrointestinal: No abdominal pain.  Positive intermittent nausea, no vomiting.  No diarrhea.  No constipation. Genitourinary: Negative for dysuria. Musculoskeletal: Negative for back pain. Skin: Negative for rash. Neurological: Negative for headaches. No focal numbness, tingling or  weakness.   10-point ROS otherwise negative.  ____________________________________________   PHYSICAL EXAM:  VITAL SIGNS: ED Triage Vitals  Enc Vitals Group     BP 07/25/16 1802 (!) 157/91     Pulse Rate 07/25/16 1802 (!) 104     Resp 07/25/16 1802 18     Temp 07/25/16 1802 97.6 F (36.4 C)     Temp Source 07/25/16 1802 Oral     SpO2 07/25/16 1802 100 %     Weight 07/25/16 1802 160 lb (72.6 kg)     Height 07/25/16 1802 5\' 3"  (1.6 m)     Head Circumference --      Peak Flow --      Pain Score 07/25/16 1801 9     Pain Loc --      Pain Edu? --      Excl. in [place]? --     Constitutional: Alert and oriented. Well appearing and in no acute distress. Answers questions appropriately. Eyes: Conjunctivae are normal.  EOMI. No scleral icterus. Head: Atraumatic. Nose: No congestion/rhinnorhea. Mouth/Throat: Mucous membranes are moist.  Neck: No stridor.  Supple.  No JVD. Cardiovascular: Normal rate, regular rhythm with occasional extra beats. No murmurs, rubs or gallops.  Respiratory: Normal respiratory effort.  No accessory muscle use or retractions. Lungs CTAB.  No  wheezes, rales or ronchi. Gastrointestinal: Soft, nontender and nondistended.  No guarding or rebound.  No peritoneal signs. Musculoskeletal: No LE edema. No ttp in the calves or palpable cords.  Negative Homan's sign. Neurologic:  A&Ox3.  Speech is clear.  Face and smile are symmetric.  EOMI.  Moves all extremities well. Skin:  Skin is warm, dry and intact. No rash noted. Psychiatric: Mood and affect are normal. Speech and behavior are normal.  Normal judgement.  ____________________________________________   LABS (all labs ordered are listed, but only abnormal results are displayed)  Labs Reviewed  BASIC METABOLIC PANEL - Abnormal; Notable for the following:       Result Value   Glucose, Bld 110 (*)    All other components within normal limits  CBC - Abnormal; Notable for the following:    RBC 5.42 (*)    MCV  79.5 (*)    RDW 14.8 (*)    All other components within normal limits  TROPONIN I  TSH  MAGNESIUM  BRAIN NATRIURETIC PEPTIDE   ____________________________________________  EKG  ED ECG REPORT I, Eula Listen, the attending physician, personally viewed and interpreted this ECG.   Date: 07/25/2016  EKG Time: 1807  Rate: 103  Rhythm: sinus tachycardia; very frequent PVC's  Axis: normal  Intervals:none  ST&T Change: No STEMI  This EKG is compared to 4/18, with similar morpology and also with frequent PVC's.  ____________________________________________  RADIOLOGY  Dg Chest 2 View  Result Date: 07/25/2016 CLINICAL DATA:  Heart palpitations and shortness of breath for the past 3 weeks, worsening. Smoker. EXAM: CHEST  2 VIEW COMPARISON:  06/05/2016. FINDINGS: Normal sized heart. Tortuous and calcified thoracic aorta. The lungs remain clear and mildly hyperexpanded with mild diffuse prominence of the interstitial markings. No acute bony abnormality. IMPRESSION: No acute abnormality.  Stable mild changes of COPD. Electronically Signed   By: Claudie Revering M.D.   On: 07/25/2016 18:38    ____________________________________________   PROCEDURES  Procedure(s) performed: None  Procedures  Critical Care performed: No ____________________________________________   INITIAL IMPRESSION / ASSESSMENT AND PLAN / ED COURSE  Pertinent labs & imaging results that were available during my care of the patient were reviewed by me and considered in my medical decision making (see chart for details).  51 y.o. female with a history of CAD presenting with symptomatic palpitations. Overall, the patient is mildly hypertensive and mildly tachycardic here. Her EKG does show frequent PVCs, which is also seen on the heart monitor. We'll get basic labs, troponin, and speak with Dr. Fletcher Anon, the patient's time cardiologist for final disposition. Treat her with a GI cocktail, as well as an aspirin.  They eating aspect of this is interesting and I will discuss this with Dr. Sophronia Simas.  ----------------------------------------- 10:12 PM on 07/25/2016 -----------------------------------------  The patient's workup in the emergency department has been reassuring. Her troponin is negative. Her magnesium, and other lites lites including potassium are also within normal limits. Her TSH is also normal. Her BNP is 22. Her EKG shows frequent PVCs, and I think the etiology of her symptoms is symptomatic PVCs. I've spoken with the cardiologist on-call for her primary cardiologist, and she already has an appointment tomorrow. The patient feels comfortable being discharged home with follow-up tomorrow; I anticipate medication management. There is no evidence of acute coronary artery pathology tonight. She had since return precautions as well as follow-up instructions. ____________________________________________  FINAL CLINICAL IMPRESSION(S) / ED DIAGNOSES  Final diagnoses:  Symptomatic PVCs  Lightheadedness  Shortness of breath         NEW MEDICATIONS STARTED DURING THIS VISIT:  New Prescriptions   No medications on file      Eula Listen, MD 07/25/16 2213

## 2016-07-25 NOTE — ED Notes (Signed)
Fluid still has 517mL to go

## 2016-07-25 NOTE — Telephone Encounter (Signed)
Reviewed patient's chart and I too cannot find evidence of prior cardiac monitoring. I do see where a monitor was ordered in 01/2014, though no record of this being completed. Recommend patient wear a 14-day Zio monitor prior to office visit with Ignacia Bayley on 6/19 to evaluate heart rate and rhythm and compare with symptoms. Her potassium was also noted to be low on 4/5 at 3.4 and I do not see where she got any KCl for repletion while admitted. Please have her come in for bmet, magnesium, cbc, and tsh on Friday, 5/25. Replete electrolytes as necessary pending labs.

## 2016-07-25 NOTE — ED Notes (Signed)
ED Provider at bedside. 

## 2016-07-26 ENCOUNTER — Other Ambulatory Visit
Admission: RE | Admit: 2016-07-26 | Discharge: 2016-07-26 | Disposition: A | Discharge status: Home / Self Care | Payer: Medicare Other | Source: Ambulatory Visit | Attending: Cardiovascular Disease | Admitting: Cardiovascular Disease

## 2016-07-26 ENCOUNTER — Ambulatory Visit (INDEPENDENT_AMBULATORY_CARE_PROVIDER_SITE_OTHER): Payer: Medicare Other

## 2016-07-26 DIAGNOSIS — R002 Palpitations: Secondary | ICD-10-CM

## 2016-07-26 DIAGNOSIS — R0602 Shortness of breath: Secondary | ICD-10-CM | POA: Diagnosis not present

## 2016-07-26 LAB — CBC WITH DIFFERENTIAL/PLATELET
Basophils Absolute: 0.1 10*3/uL (ref 0–0.1)
Basophils Relative: 1 %
Eosinophils Absolute: 0.1 10*3/uL (ref 0–0.7)
Eosinophils Relative: 2 %
HCT: 42.4 % (ref 35.0–47.0)
HEMOGLOBIN: 14.4 g/dL (ref 12.0–16.0)
LYMPHS ABS: 2 10*3/uL (ref 1.0–3.6)
Lymphocytes Relative: 33 %
MCH: 27.4 pg (ref 26.0–34.0)
MCHC: 33.9 g/dL (ref 32.0–36.0)
MCV: 80.7 fL (ref 80.0–100.0)
MONO ABS: 0.4 10*3/uL (ref 0.2–0.9)
MONOS PCT: 7 %
NEUTROS ABS: 3.4 10*3/uL (ref 1.4–6.5)
Neutrophils Relative %: 57 %
Platelets: 221 10*3/uL (ref 150–440)
RBC: 5.25 MIL/uL — ABNORMAL HIGH (ref 3.80–5.20)
RDW: 14.5 % (ref 11.5–14.5)
WBC: 6.1 10*3/uL (ref 3.6–11.0)

## 2016-07-26 LAB — TSH: TSH: 2.524 u[IU]/mL (ref 0.350–4.500)

## 2016-07-26 LAB — BASIC METABOLIC PANEL
Anion gap: 6 (ref 5–15)
BUN: 13 mg/dL (ref 6–20)
CO2: 30 mmol/L (ref 22–32)
Calcium: 9.1 mg/dL (ref 8.9–10.3)
Chloride: 103 mmol/L (ref 101–111)
Creatinine, Ser: 0.86 mg/dL (ref 0.44–1.00)
GFR calc Af Amer: >60 mL/min (ref >60)
GLUCOSE: 103 mg/dL — AB (ref 65–99)
POTASSIUM: 4.5 mmol/L (ref 3.5–5.1)
Sodium: 139 mmol/L (ref 135–145)

## 2016-07-26 LAB — MAGNESIUM: Magnesium: 2.1 mg/dL (ref 1.7–2.4)

## 2016-07-30 NOTE — Telephone Encounter (Signed)
The patient now I would be happy to review the pH study if she can get me a copy. I do not have a copy of that report.

## 2016-08-01 NOTE — Telephone Encounter (Signed)
Left pt a vm letting her know Dr. Allen Norris cannot see her [place] study on Care everywhere as its not listed. Advised her per Dr Allen Norris to contact [facility] and have a copy faxed to our office and he will review.

## 2016-08-08 ENCOUNTER — Other Ambulatory Visit: Payer: Self-pay | Admitting: Family Medicine

## 2016-08-08 NOTE — Telephone Encounter (Signed)
Please call in alprazolam.  

## 2016-08-08 NOTE — Telephone Encounter (Signed)
Rx called in to pharmacy. 

## 2016-08-16 ENCOUNTER — Telehealth: Payer: Self-pay | Admitting: Nurse Practitioner

## 2016-08-16 DIAGNOSIS — R002 Palpitations: Secondary | ICD-10-CM | POA: Diagnosis not present

## 2016-08-16 IMAGING — CR DG CHEST 2V
1 series · 2 of 2 positions shown · non-contrast
Comparison: 10/23/2014

CLINICAL DATA: Difficulty breathing.  Recent surgery.

EXAM:
CHEST  2 VIEW

[Series 1: w chest pa · 0.14mm/px · 2 of 2 slices shown]
[im 1/2]
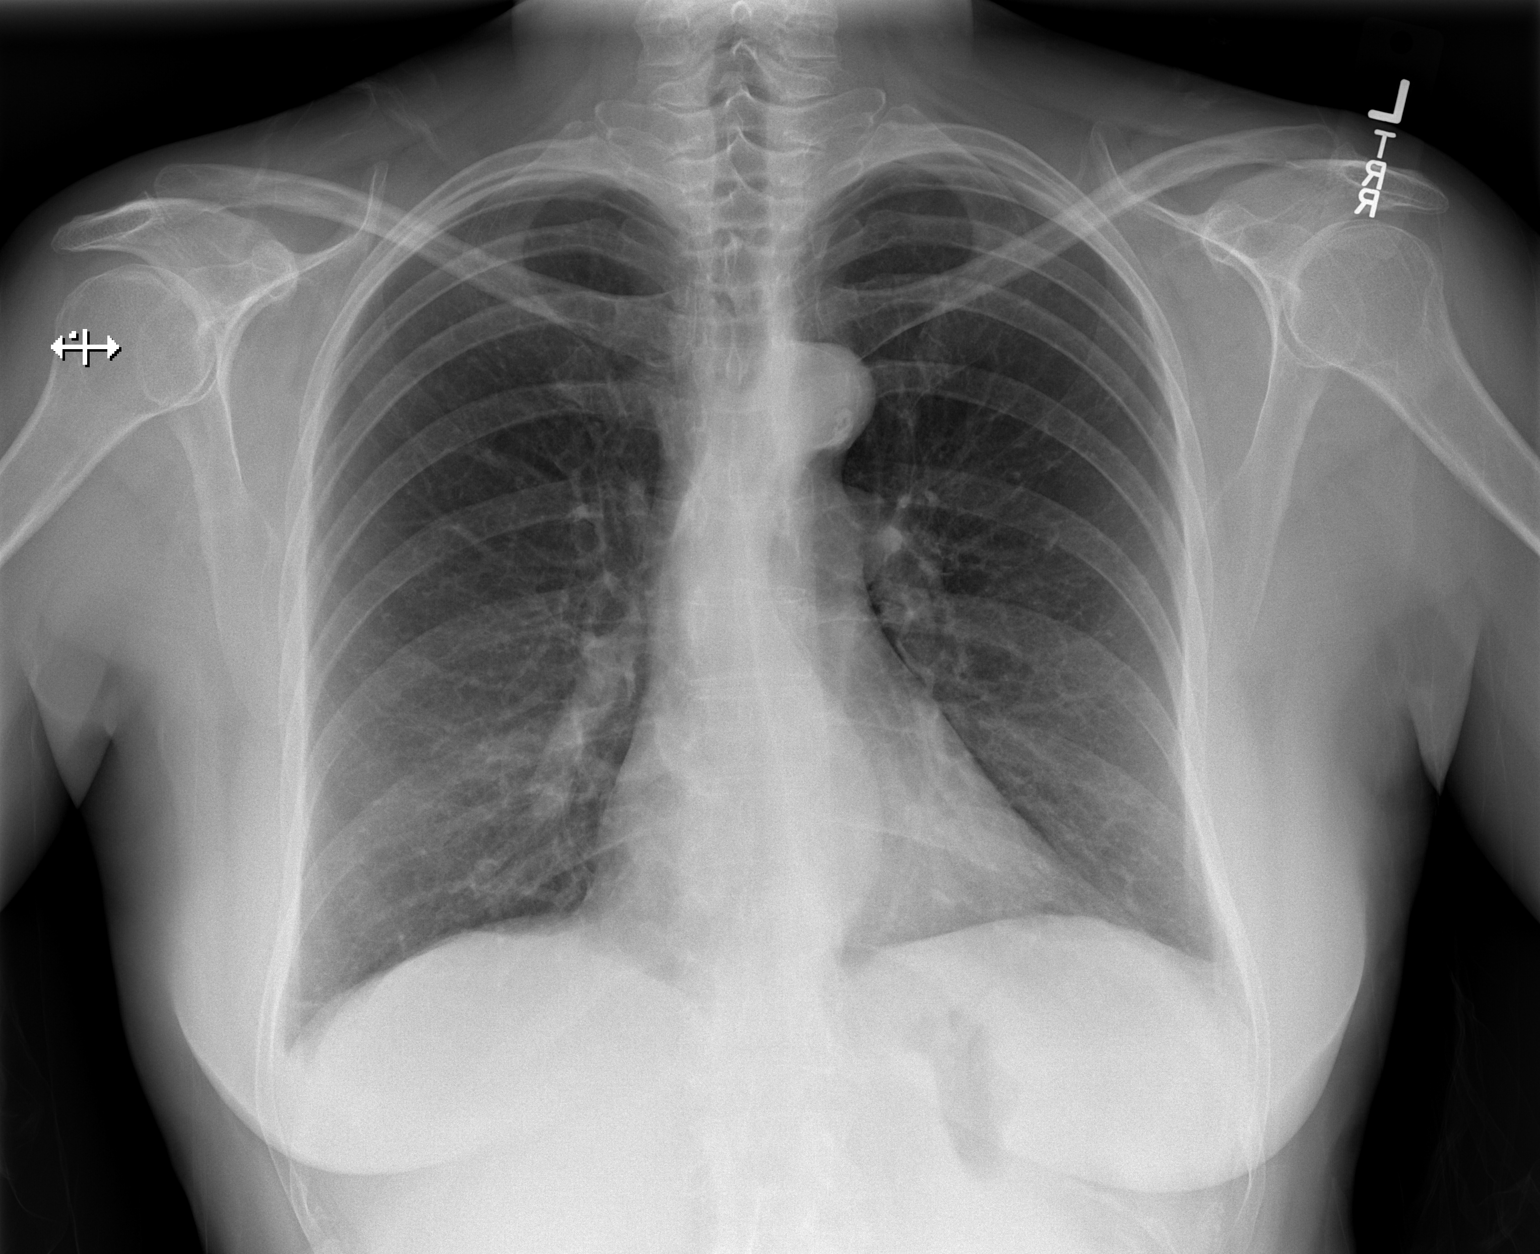
[im 2/2]
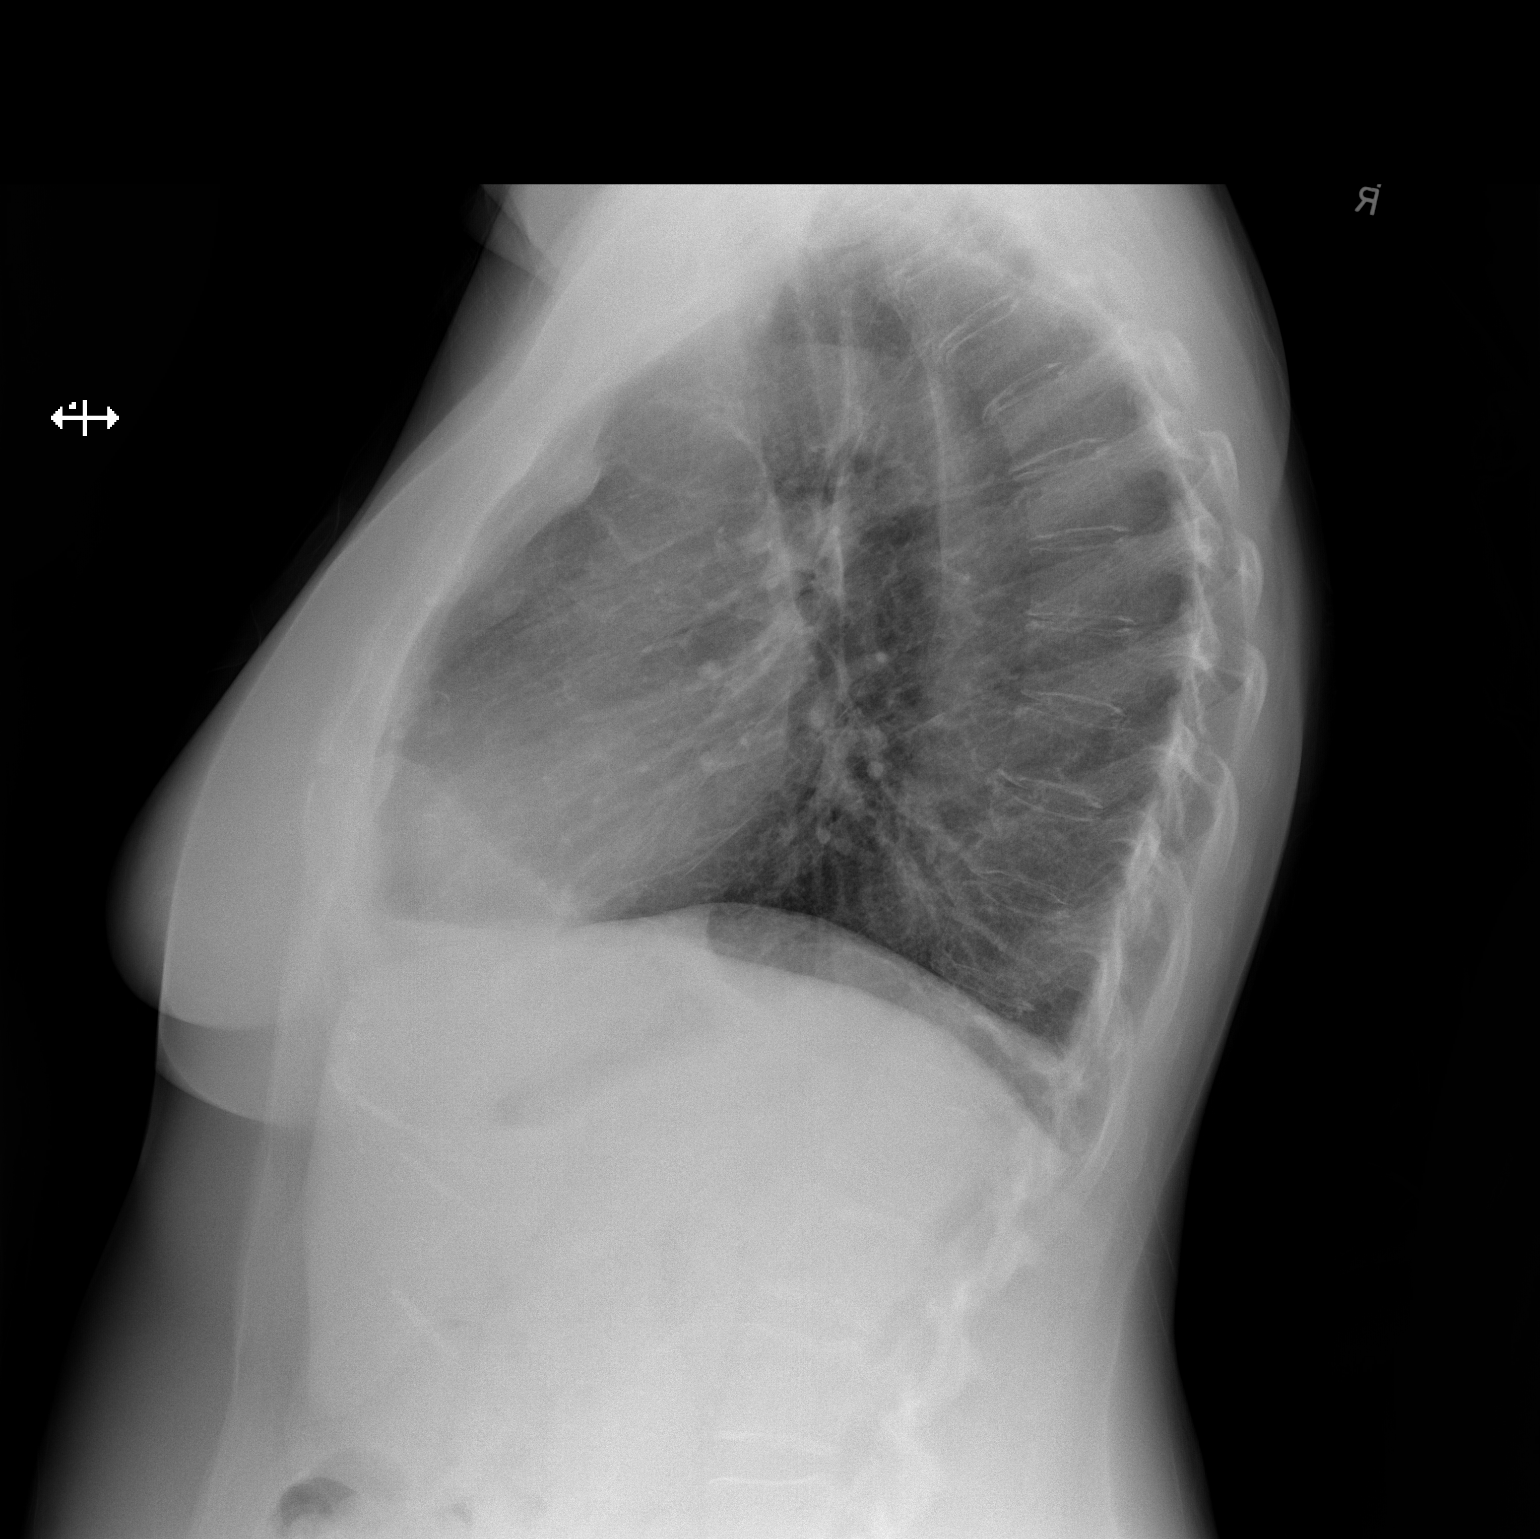

[2 of 2 positions shown; findings below may reference images not displayed]

FINDINGS: Normal heart size and stable aortic tortuosity. Trace atelectasis at
the peripheral right base. There is no edema, consolidation,
effusion, or pneumothorax.
IMPRESSION: No evidence of acute cardiopulmonary disease.

## 2016-08-16 NOTE — Telephone Encounter (Signed)
Pt would like holter monitor results.

## 2016-08-16 NOTE — Telephone Encounter (Signed)
It looks like Ryan ordered a Zio on 5/24.  There is nothing scanned in for review yet.  I assume, it will be scanned in for Dr. Fletcher Anon to review once the report is received.

## 2016-08-19 NOTE — Telephone Encounter (Signed)
Patient wants to know if she should keep fu appointment tomorrow if results of holter are not available.  Patient does not want to make multiple appt trips.  Please advise if we should keep appt or r/s

## 2016-08-19 NOTE — Telephone Encounter (Signed)
ZIO report scanned to patient's record.  Report will be available for review during patient's appt tomorrow. Patient notified and was very appreciative.

## 2016-08-20 ENCOUNTER — Ambulatory Visit (INDEPENDENT_AMBULATORY_CARE_PROVIDER_SITE_OTHER): Payer: Medicare Other | Admitting: Nurse Practitioner

## 2016-08-20 ENCOUNTER — Encounter: Payer: Self-pay | Admitting: Nurse Practitioner

## 2016-08-20 VITALS — BP 136/70 | HR 82 | Ht 63.0 in | Wt 164.8 lb

## 2016-08-20 DIAGNOSIS — I493 Ventricular premature depolarization: Secondary | ICD-10-CM

## 2016-08-20 DIAGNOSIS — I471 Supraventricular tachycardia: Secondary | ICD-10-CM | POA: Diagnosis not present

## 2016-08-20 DIAGNOSIS — Z720 Tobacco use: Secondary | ICD-10-CM

## 2016-08-20 DIAGNOSIS — R002 Palpitations: Secondary | ICD-10-CM

## 2016-08-20 MED ORDER — METOPROLOL TARTRATE 25 MG PO TABS: 12.5000 mg | ORAL_TABLET | Freq: Two times a day (BID) | ORAL | 3 refills | Status: DC

## 2016-08-20 NOTE — Progress Notes (Signed)
Office Visit    Patient Name: Andrea [name] Date of Encounter: 08/20/2016  Primary Care Provider:  Birdie Sons, MD Primary Cardiologist:  Jerilynn Mages. Fletcher Anon, MD   Chief Complaint    51 year old female with a history of chest pain and nonobstructive CAD, palpitations with symptomatically PVCs, brief runs of SVT, tobacco abuse, hypertension, anxiety, and panic attacks, who presents for follow-up related to palpitations.  Past Medical History    Past Medical History:  Diagnosis Date  . Anxiety    panic attacks, chest pain  . Arthritis   . COPD (chronic obstructive pulmonary disease) ([place])   . GERD (gastroesophageal reflux disease)   . Hypertension    not medicated  . Non-obstructive CAD in native artery    a. cardiac cath 01/2014: showed minor irregularities, mildly elevated left ventricular end-diastolic pressure and normal ejection fraction; b. 03/2015 MV: low risk.  . Palpitations    a. 08/2016 Event Monitor: occas PVC's, two brief runs of SVT (4 and 7 beats).  Marland Kitchen PONV (postoperative nausea and vomiting)   . Symptomatic PVC's (premature ventricular contractions)   . Tobacco abuse   . Wears dentures    partial upper and lower   Past Surgical History:  Procedure Laterality Date  . CARDIAC CATHETERIZATION  01/03/14  . CARDIAC CATHETERIZATION    . ESOPHAGOGASTRODUODENOSCOPY (EGD) WITH PROPOFOL N/A 07/11/2016   Procedure: ESOPHAGOGASTRODUODENOSCOPY (EGD) WITH PROPOFOL;  Surgeon: Lucilla Lame, MD;  Location: [place];  Service: Endoscopy;  Laterality: N/A;  . EYE SURGERY    . IMAGE GUIDED SINUS SURGERY N/A 11/30/2014   Procedure: IMAGE GUIDED SINUS SURGERY;  Surgeon: Carloyn Manner, MD;  Location: [place];  Service: ENT;  Laterality: N/A;  GAVE DISK TO CE CE  . MAXILLARY ANTROSTOMY Bilateral 11/30/2014   Procedure: MAXILLARY ANTROSTOMY;  Surgeon: Carloyn Manner, MD;  Location: [place];  Service: ENT;  Laterality: Bilateral;  . SEPTOPLASTY N/A  11/30/2014   Procedure: SEPTOPLASTY;  Surgeon: Carloyn Manner, MD;  Location: [place];  Service: ENT;  Laterality: N/A;  . SPHENOIDECTOMY Left 11/30/2014   Procedure: Coralee Pesa, ;  Surgeon: Carloyn Manner, MD;  Location: [place];  Service: ENT;  Laterality: Left;  Marland Kitchen VAGINAL HYSTERECTOMY  1995   vaginal    Allergies  Allergies  Allergen Reactions  . Alum & Mag Hydroxide-Simeth Diarrhea and Nausea Only  . Cefdinir Other (See Comments)    tachycardia  . Chantix [Varenicline]     Bad dreams  . Doxycycline Other (See Comments)    Nausea, migraine  . Ivp Dye [Iodinated Diagnostic Agents] Palpitations    History of Present Illness    51 year old female with the above past medical history including chest pain with nonobstructive CAD noted on catheterization in November 2015, palpitations, symptom PVCs, tobacco abuse, hypertension, anxiety, and panic attacks. She was last seen in cardiology clinic in October 2016. Since then, she has had several ER visits related to COPD, chest pain with negative Myoview, vertigo, and most recently with symptom PVCs and palpitations. Following that ER visit, we placed a 2 week event monitor which showed occasional PVCs and 2 very brief runs of SVT with a maximum of 7 beats. She did note palpitations over that time. She said yesterday she had a more prolonged episode of intermittent palpitations lasting about 20 minutes. Of note, on her last ER visit for symptomatically PVCs on May 24, her potassium was 3.7 and required supplementation.   Palpitations might occur at  any time and generally just last a few seconds. She does not usually experience lightheadedness, chest pain, or dyspnea when palpitations occur. She had previously taken propranolol but this was discontinued at one of her hospitalizations in the setting of low blood pressures. She denies PND, orthopnea, dizziness, syncope, edema, or early satiety.  Home Medications      Prior to Admission medications   Medication Sig Start Date End Date Taking? Authorizing Provider  albuterol (PROVENTIL HFA;VENTOLIN HFA) 108 (90 Base) MCG/ACT inhaler Inhale 2 puffs into the lungs every 6 (six) hours as needed for wheezing or shortness of breath. 06/11/16  Yes Birdie Sons, MD  albuterol (PROVENTIL) (2.5 MG/3ML) 0.083% nebulizer solution Take 3 mLs (2.5 mg total) by nebulization every 4 (four) hours as needed for wheezing or shortness of breath. Reported on 03/19/2015 03/19/15  Yes Theodoro Grist, MD  ALPRAZolam Duanne Moron) 1 MG tablet TAKE 1 TABLET BY MOUTH EVERY 8 HOURS AS NEEDED 08/08/16  Yes Birdie Sons, MD  ARIPiprazole (ABILIFY) 5 MG tablet Take 5 mg by mouth daily. Reported on 03/19/2015   Yes [provider]  clonazePAM (KLONOPIN) 1 MG tablet TAKE 1 TABLET BY MOUTH TWICE A DAY AS NEEDED 06/10/16  Yes Birdie Sons, MD  dexlansoprazole (DEXILANT) 60 MG capsule Take 1 capsule (60 mg total) by mouth daily. 07/24/16  Yes Lucilla Lame, MD  hydroxychloroquine (PLAQUENIL) 200 MG tablet Take 2 tablets (400 mg total) by mouth daily. 02/12/16  Yes Birdie Sons, MD  montelukast (SINGULAIR) 10 MG tablet Take 1 tablet (10 mg total) by mouth at bedtime. Start taking ONE day after finishing prednisone 06/11/16  Yes Birdie Sons, MD  nitroGLYCERIN (NITROSTAT) 0.4 MG SL tablet Place 1 tablet (0.4 mg total) under the tongue every 5 (five) minutes as needed for chest pain. 12/08/14  Yes Wellington Hampshire, MD  Respiratory Therapy Supplies (FLUTTER) DEVI 1 Device by Does not apply route daily. 03/19/15  Yes Theodoro Grist, MD  sucralfate (CARAFATE) 1 g tablet Take 1 tablet (1 g total) by mouth 4 (four) times daily -  with meals and at bedtime. 06/03/16  Yes Carmon Ginsberg, PA  TraZODone HCl 150 MG TB24 Take 150 mg by mouth at bedtime as needed. Reported on 03/19/2015   Yes [provider]  umeclidinium-vilanterol (ANORO ELLIPTA) 62.5-25 MCG/INH AEPB Inhale 1 puff into the  lungs daily. 07/04/16  Yes Laverle Hobby, MD  metoprolol tartrate (LOPRESSOR) 25 MG tablet Take 0.5 tablets (12.5 mg total) by mouth 2 (two) times daily. 08/20/16   Rogelia Mire, NP    Review of Systems    Palpitations as outlined above.  She denies chest pain, dyspnea, pnd, orthopnea, n, v, dizziness, syncope, edema, weight gain, or early satiety.  All other systems reviewed and are otherwise negative except as noted above.  Physical Exam    VS:  BP 136/70 (BP Location: Left Arm, Patient Position: Sitting, Cuff Size: Normal)   Pulse 82   Ht 5\' 3"  (1.6 m)   Wt 164 lb 12 oz (74.7 kg)   BMI 29.18 kg/m  , BMI Body mass index is 29.18 kg/m. GEN: Well nourished, well developed, in no acute distress.  HEENT: normal.  Neck: Supple, no JVD, carotid bruits, or masses. Cardiac: RRR, no murmurs, rubs, or gallops. No clubbing, cyanosis, edema.  Radials/DP/PT 2+ and equal bilaterally.  Respiratory:  Respirations regular and unlabored, clear to auscultation bilaterally. GI: Soft, nontender, nondistended, BS + x 4. MS: no deformity  or atrophy. Skin: warm and dry, no rash. Neuro:  Strength and sensation are intact. Psych: Normal affect.  Accessory Clinical Findings    ECG - Sinus rhythm, nonspecific T changes.  Assessment & Plan    1.  PSVT/symptom at PVCs/palpitations: Patient has a prior history of palpitations and symptomatically PVCs. These recurred recently and she wore an event monitor which did show 2 brief runs of SVT and also occasional PVCs. She notes that these palpitations are quite bothersome for her. In that setting, I will add metoprolol 12.5 mg twice a day. We'll also check a basic metabolic panel as in the past, hypokalemia may have played a role. Finally, follow-up echocardiogram to rule out structural abnormalities.  2. History of chest pain/nonobstructive CAD: No recent recurrence. She had a negative Myoview in January 2017.  3. Tobacco abuse: We discussed the  importance of complete cessation. She is currently smoking half pack a day. She does wish to quit and realizes she just needs to make that commitment. She has tried cessation aids in the past without success.  4. Disposition: Follow-up basic metabolic panel and echo. Follow-up with Dr. Fletcher Anon in 3 months or sooner if necessary.   Murray Hodgkins, NP 08/20/2016, 4:04 PM

## 2016-08-20 NOTE — Patient Instructions (Signed)
Medication Instructions:  Your physician has recommended you make the following change in your medication:  1- START Lopressor 12.5 mg (0.5 tablet) by mouth two times a day.   Labwork: Your physician recommends that you return for lab work in: TODAY (BMP) - Please go to the [facility]. You will check in at the front desk to the right as you walk into the atrium. Valet Parking is offered if needed.     Testing/Procedures: Your physician has requested that you have an echocardiogram. Echocardiography is a painless test that uses sound waves to create images of your heart. It provides your doctor with information about the size and shape of your heart and how well your heart's chambers and valves are working. This procedure takes approximately one hour. There are no restrictions for this procedure.    Follow-Up: Your physician recommends that you schedule a follow-up appointment in: [place].  If you need a refill on your cardiac medications before your next appointment, please call your pharmacy.     Echocardiogram An echocardiogram, or echocardiography, uses sound waves (ultrasound) to produce an image of your heart. The echocardiogram is simple, painless, obtained within a short period of time, and offers valuable information to your health care provider. The images from an echocardiogram can provide information such as:  Evidence of coronary artery disease (CAD).  Heart size.  Heart muscle function.  Heart valve function.  Aneurysm detection.  Evidence of a past heart attack.  Fluid buildup around the heart.  Heart muscle thickening.  Assess heart valve function.  Tell a health care provider about:  Any allergies you have.  All medicines you are taking, including vitamins, herbs, eye drops, creams, and over-the-counter medicines.  Any problems you or family members have had with anesthetic medicines.  Any blood disorders you have.  Any  surgeries you have had.  Any medical conditions you have.  Whether you are pregnant or may be pregnant. What happens before the procedure? No special preparation is needed. Eat and drink normally. What happens during the procedure?  In order to produce an image of your heart, gel will be applied to your chest and a wand-like tool (transducer) will be moved over your chest. The gel will help transmit the sound waves from the transducer. The sound waves will harmlessly bounce off your heart to allow the heart images to be captured in real-time motion. These images will then be recorded.  You may need an IV to receive a medicine that improves the quality of the pictures. What happens after the procedure? You may return to your normal schedule including diet, activities, and medicines, unless your health care provider tells you otherwise. This information is not intended to replace advice given to you by your health care provider. Make sure you discuss any questions you have with your health care provider. Document Released: 02/16/2000 Document Revised: 10/07/2015 Document Reviewed: 10/26/2012 Elsevier Interactive Patient Education  2017 [employer].

## 2016-08-21 ENCOUNTER — Other Ambulatory Visit
Admission: RE | Admit: 2016-08-21 | Discharge: 2016-08-21 | Disposition: A | Discharge status: Home / Self Care | Payer: Medicare Other | Source: Ambulatory Visit | Attending: Nurse Practitioner | Admitting: Nurse Practitioner

## 2016-08-21 DIAGNOSIS — R002 Palpitations: Secondary | ICD-10-CM | POA: Diagnosis not present

## 2016-08-21 LAB — BASIC METABOLIC PANEL
Anion gap: 7 (ref 5–15)
BUN: 13 mg/dL (ref 6–20)
CALCIUM: 9.7 mg/dL (ref 8.9–10.3)
CO2: 30 mmol/L (ref 22–32)
CREATININE: 0.76 mg/dL (ref 0.44–1.00)
Chloride: 101 mmol/L (ref 101–111)
GFR calc Af Amer: >60 mL/min (ref >60)
GLUCOSE: 78 mg/dL (ref 65–99)
Potassium: 4.8 mmol/L (ref 3.5–5.1)
Sodium: 138 mmol/L (ref 135–145)

## 2016-09-25 ENCOUNTER — Other Ambulatory Visit: Payer: Self-pay

## 2016-09-25 ENCOUNTER — Ambulatory Visit (INDEPENDENT_AMBULATORY_CARE_PROVIDER_SITE_OTHER): Payer: Medicare Other

## 2016-09-25 DIAGNOSIS — R002 Palpitations: Secondary | ICD-10-CM | POA: Diagnosis not present

## 2016-09-25 LAB — ECHOCARDIOGRAM COMPLETE
AO mean calculated velocity dopler: 72.5 cm/s
AOASC: 28 cm
AV Area VTI index: 1.31 cm2/m2
AV vel: 2.33
AVAREAMEANV: 2 cm2
AVAREAMEANVIN: 1.13 cm2/m2
AVCELMEANRAT: 0.79
AVG: 2 mmHg
Area-P 1/2: 4.89 cm2
CHL CUP AV VALUE AREA INDEX: 1.31
CHL CUP DOP CALC LVOT VTI: 18.6 cm
E/e' ratio: 7.44
EWDT: 155 ms
FS: 30 % (ref 28–44)
IVS/LV PW RATIO, ED: 1.1
LA diam end sys: 31 mm
LA diam index: 1.74 cm/m2
LA vol A4C: 33 ml
LA vol: 33.7 mL
LASIZE: 31 mm
LAVOLIN: 18.9 mL/m2
LDCA: 2.54 cm2
LV E/e' medial: 7.44
LV PW d: 10 mm — AB (ref 0.6–1.1)
LVEEAVG: 7.44
LVELAT: 12.4 cm/s
LVOT SV: 47 mL
LVOTD: 18 mm
LVOTVTI: 0.92 cm
Lateral S' vel: 11.1 cm/s
MV Dec: 155
MV Peak grad: 3 mmHg
MV pk E vel: 92.2 m/s
MVPKAVEL: 71.3 m/s
MVSPHT: 45 ms
RV TAPSE: 23.3 mm
TDI e' lateral: 12.4
TDI e' medial: 8.49
VTI: 20.3 cm
Valve area: 2.33 cm2

## 2016-10-24 ENCOUNTER — Other Ambulatory Visit: Payer: Self-pay | Admitting: Family Medicine

## 2016-10-24 NOTE — Telephone Encounter (Signed)
RX called in at [facility]  

## 2016-10-24 NOTE — Telephone Encounter (Signed)
Please call in clonazepam  

## 2016-11-10 ENCOUNTER — Other Ambulatory Visit: Payer: Self-pay | Admitting: Family Medicine

## 2016-11-10 DIAGNOSIS — K2100 Gastro-esophageal reflux disease with esophagitis, without bleeding: Secondary | ICD-10-CM

## 2016-11-11 ENCOUNTER — Ambulatory Visit (INDEPENDENT_AMBULATORY_CARE_PROVIDER_SITE_OTHER): Payer: Medicare Other | Admitting: Internal Medicine

## 2016-11-11 ENCOUNTER — Ambulatory Visit (INDEPENDENT_AMBULATORY_CARE_PROVIDER_SITE_OTHER): Payer: Medicare Other | Admitting: Cardiovascular Disease

## 2016-11-11 ENCOUNTER — Encounter: Payer: Self-pay | Admitting: Internal Medicine

## 2016-11-11 ENCOUNTER — Encounter: Payer: Self-pay | Admitting: Cardiovascular Disease

## 2016-11-11 VITALS — BP 138/84 | HR 89 | Resp 16 | Ht 63.0 in | Wt 167.0 lb

## 2016-11-11 VITALS — BP 129/85 | HR 83 | Ht 63.0 in | Wt 167.0 lb

## 2016-11-11 DIAGNOSIS — F17210 Nicotine dependence, cigarettes, uncomplicated: Secondary | ICD-10-CM | POA: Diagnosis not present

## 2016-11-11 DIAGNOSIS — Z720 Tobacco use: Secondary | ICD-10-CM

## 2016-11-11 DIAGNOSIS — R002 Palpitations: Secondary | ICD-10-CM | POA: Diagnosis not present

## 2016-11-11 DIAGNOSIS — I10 Essential (primary) hypertension: Secondary | ICD-10-CM | POA: Diagnosis not present

## 2016-11-11 DIAGNOSIS — J449 Chronic obstructive pulmonary disease, unspecified: Secondary | ICD-10-CM | POA: Diagnosis not present

## 2016-11-11 NOTE — Progress Notes (Signed)
[facility] Note     Assessment and Plan:  The patient is a 51 year old smoker with moderate COPD, and multiple COPD exacerbations in the past. Patient also has a significant anxiety component.   Tobacco abuse --Discussed importance of smoking cessation today. Greater than 3 minutes spent in discussion.   COPD, group D with multiple exacerbations.- -, Symptoms appear to be much better controlled with once daily. Anoro, and with no exacerbations since her last visit. --Continue Anoro once daily.  Anxiety --I suspect this may be playing a role in her dyspnea in addition to emphysema.  -Continue current treatment.  Pulmonary nodule 06/2013 CT high res chest> mild centrilobular emphysema but no ILD, multiple scattered pulm nodules all 14mm in size 07/2014 CT chest > nodule unchanged no further follow up recommended.   Daytime somnolence 05/2013 Sleep study [facility] > AHI 3.8  GERD. -Continue Dexilant, continue follow-up with gastroenterology.     HPI:   Andrea [name] is a 51 year old Caucasian female with severe COPD, which appears to be complicated by anxiety. .   Her smoking is down from 1 ppd to 0.5. She also notes significant GERD symptoms, is currently taking Dexilant. She is following up with gastroenterology.  **Review of tracings personally, 06/18/16 PFT: FVC is 82%, FEV1 is a 66% without significant reversibility, ratio is 60%. Lung volumes are unremarkable. Diffusion capacity is 57% of predicted. Flow volume loop is consistent with obstruction. Overall, this testing is consistent with moderate objective lung disease with an FEV1 of 60%.  **Chest x-ray  06/19/14 ; showed emphysematous changes, otherwise unremarkable with hyperinflated lungs, minimal difference in comparison with previous films.  CT chest from 06/10/2013 was reviewed that showed some scattered emphysematous changes but otherwise unremarkable without evidence of bronchiectasis.  Desat walk  07/04/2016 Baseline O2 was 96% and HR 87 at rest on RA. After walking 600 feet sat was 94% and HR 98 on RA. Pt complained of mild dyspnea, but appeared comfortable and was speaking full sentences.   Medication:    Reviewed   Allergies:  Alum & mag hydroxide-simeth; Cefdinir; Chantix [varenicline]; Doxycycline; and Ivp dye [iodinated diagnostic agents]  Review of Systems: Gen:  Denies  fever, sweats, chills HEENT: Denies blurred vision, double vision, ear pain, eye pain, hearing loss, nose bleeds, sore throat Cvc:  No dizziness, chest pain or heaviness Resp:   Denies cough or sputum porduction, shortness of breath Gi: Denies swallowing difficulty, stomach pain, nausea or vomiting, diarrhea, constipation, bowel incontinence Gu:  Denies bladder incontinence, burning urine Ext:   No Joint pain, stiffness or swelling Skin: No skin rash, easy bruising or bleeding or hives Endoc:  No polyuria, polydipsia , polyphagia or weight change Psych: No depression, insomnia or hallucinations  Other:  All other systems negative  Physical Examination:   VS: BP 138/84 (BP Location: Left Arm)   Pulse 89   Resp 16   Ht 5\' 3"  (1.6 m)   Wt 167 lb (75.8 kg)   SpO2 95%   BMI 29.58 kg/m   General Appearance: No distress  Neuro:without focal findings,  speech normal,  HEENT: PERRLA, EOM intact.   Pulmonary: normal breath sounds, No wheezing, No rales;    CardiovascularNormal S1,S2.  No m/r/g.   Abdomen: Benign, Soft, non-tender. Renal:  No costovertebral tenderness  GU:  No performed at this time. Endoc: No evident thyromegaly Skin:   warm, no rashes, no ecchymosis  Extremities: normal, no cyanosis, clubbing.  Thank  you for the consultation and for allowing [facility] to assist in the care of your patient. Our recommendations are noted above.  Please contact us if we can be of further service.   Marda Stalker, MD.  Board Certified in Internal Medicine,  Pulmonary Medicine, [place], and Sleep Medicine.  [facility] Office Number: [phone]  Patricia Pesa, M.D.  Cheral Marker, M.D

## 2016-11-11 NOTE — Patient Instructions (Signed)
Medication Instructions: Continue same medications.   Labwork: None.   Procedures/Testing: None.   Follow-Up: 1 year with Dr. Evora Schechter.   Any Additional Special Instructions Will Be Listed Below (If Applicable).     If you need a refill on your cardiac medications before your next appointment, please call your pharmacy.   

## 2016-11-11 NOTE — Progress Notes (Signed)
Cardiology Office Note   Date:  11/11/2016   ID:  Andrea, [name] [birth year], MRN [id number]  PCP:  Andrea Sons, MD  Cardiologist:   Andrea Sacramento, MD   Chief Complaint  Patient presents with  . other    3 month f/u echo discuss metoprolol with pt. Meds reviewed verbally with pt.      History of Present Illness: Andrea [name] is a 51 y.o. female who presents for A follow-up visit regarding palpitations due to PVCs and short runs of SVT. She has known history of minor nonobstructive coronary artery disease on previous cardiac catheterization in November 2015. Other medical problems include tobacco use, hypertension, anxiety with panic attacks. She was seen in June by Andrea [name] for worsening palpitations. She had an echocardiogram done which was normal. Event monitor showed 2 brief runs of SVT and occasional PVCs. She was placed on metoprolol 12.5 mg twice daily. She reports improvement in symptoms overall but she does not use the medication all the time. She continues to struggle with anxiety. She continues to smoke half a pack per day.    Past Medical History:  Diagnosis Date  . Anxiety    panic attacks, chest pain  . Arthritis   . COPD (chronic obstructive pulmonary disease) ([place])   . GERD (gastroesophageal reflux disease)   . Hypertension    not medicated  . Non-obstructive CAD in native artery    a. cardiac cath 01/2014: showed minor irregularities, mildly elevated left ventricular end-diastolic pressure and normal ejection fraction; b. 03/2015 MV: low risk.  . Palpitations    a. 08/2016 Event Monitor: occas PVC's, two brief runs of SVT (4 and 7 beats).  Marland Kitchen PONV (postoperative nausea and vomiting)   . Symptomatic PVC's (premature ventricular contractions)   . Tobacco abuse   . Wears dentures    partial upper and lower    Past Surgical History:  Procedure Laterality Date  . CARDIAC CATHETERIZATION  01/03/14  . CARDIAC CATHETERIZATION    .  ESOPHAGOGASTRODUODENOSCOPY (EGD) WITH PROPOFOL N/A 07/11/2016   Procedure: ESOPHAGOGASTRODUODENOSCOPY (EGD) WITH PROPOFOL;  Surgeon: Andrea Lame, MD;  Location: [facility];  Service: Endoscopy;  Laterality: N/A;  . EYE SURGERY    . IMAGE GUIDED SINUS SURGERY N/A 11/30/2014   Procedure: IMAGE GUIDED SINUS SURGERY;  Surgeon: Andrea Manner, MD;  Location: [facility];  Service: ENT;  Laterality: N/A;  GAVE DISK TO CE CE  . MAXILLARY ANTROSTOMY Bilateral 11/30/2014   Procedure: MAXILLARY ANTROSTOMY;  Surgeon: Andrea Manner, MD;  Location: [facility];  Service: ENT;  Laterality: Bilateral;  . SEPTOPLASTY N/A 11/30/2014   Procedure: SEPTOPLASTY;  Surgeon: Andrea Manner, MD;  Location: [facility];  Service: ENT;  Laterality: N/A;  . SPHENOIDECTOMY Left 11/30/2014   Procedure: Andrea [name], ;  Surgeon: Andrea Manner, MD;  Location: [facility];  Service: ENT;  Laterality: Left;  Marland Kitchen VAGINAL HYSTERECTOMY  1995   vaginal     Current Outpatient Prescriptions  Medication Sig Dispense Refill  . albuterol (PROVENTIL HFA;VENTOLIN HFA) 108 (90 Base) MCG/ACT inhaler Inhale 2 puffs into the lungs every 6 (six) hours as needed for wheezing or shortness of breath. 1 Inhaler 3  . albuterol (PROVENTIL) (2.5 MG/3ML) 0.083% nebulizer solution Take 3 mLs (2.5 mg total) by nebulization every 4 (four) hours as needed for wheezing or shortness of breath. Reported on 03/19/2015 75 mL 12  . ALPRAZolam (XANAX) 1 MG tablet TAKE 1 TABLET BY MOUTH EVERY  8 HOURS AS NEEDED 60 tablet 3  . ARIPiprazole (ABILIFY) 5 MG tablet Take 5 mg by mouth daily. Reported on 03/19/2015    . clonazePAM (KLONOPIN) 1 MG tablet TAKE 1 TABLET BY MOUTH TWICE A DAY AS NEEDED 90 tablet 1  . dexlansoprazole (DEXILANT) 60 MG capsule Take 1 capsule (60 mg total) by mouth daily. 30 capsule 6  . hydroxychloroquine (PLAQUENIL) 200 MG tablet Take 2 tablets (400 mg total) by mouth daily. 180 tablet 3  .  metoprolol tartrate (LOPRESSOR) 25 MG tablet Take 0.5 tablets (12.5 mg total) by mouth 2 (two) times daily. 90 tablet 3  . montelukast (SINGULAIR) 10 MG tablet Take 1 tablet (10 mg total) by mouth at bedtime. Start taking ONE day after finishing prednisone 30 tablet 3  . nitroGLYCERIN (NITROSTAT) 0.4 MG SL tablet Place 1 tablet (0.4 mg total) under the tongue every 5 (five) minutes as needed for chest pain. 25 tablet 0  . Respiratory Therapy Supplies (FLUTTER) DEVI 1 Device by Does not apply route daily. 1 each 0  . sucralfate (CARAFATE) 1 g tablet TAKE 1 TABLET (1 G TOTAL) BY MOUTH 4 (FOUR) TIMES DAILY - WITH MEALS AND AT BEDTIME. 28 tablet 11  . umeclidinium-vilanterol (ANORO ELLIPTA) 62.5-25 MCG/INH AEPB Inhale 1 puff into the lungs daily. 60 each 5   No current facility-administered medications for this visit.     Allergies:   Alum & mag hydroxide-simeth; Cefdinir; Chantix [varenicline]; Doxycycline; and Ivp dye [iodinated diagnostic agents]    Social History:  The patient  reports that she has been smoking Cigarettes.  She has a 8.00 pack-year smoking history. She has never used smokeless tobacco. She reports that she does not drink alcohol or use drugs.   Family History:  The patient's family history includes Arthritis in her father; Asthma in her father; Colon cancer in her father; Emphysema in her father; Heart attack in her father; Heart disease in her father; Hypertension in her mother.    ROS:  Please see the history of present illness.   Otherwise, review of systems are positive for none.   All other systems are reviewed and negative.    PHYSICAL EXAM: VS:  BP 129/85 (BP Location: Left Arm, Patient Position: Sitting, Cuff Size: Normal)   Pulse 83   Ht 5\' 3"  (1.6 m)   Wt 167 lb (75.8 kg)   BMI 29.58 kg/m  , BMI Body mass index is 29.58 kg/m. GEN: Well nourished, well developed, in no acute distress  HEENT: normal  Neck: no JVD, carotid bruits, or masses Cardiac: RRR; no  murmurs, rubs, or gallops,no edema  Respiratory:  clear to auscultation bilaterally, normal work of breathing GI: soft, nontender, nondistended, + BS MS: no deformity or atrophy  Skin: warm and dry, no rash Neuro:  Strength and sensation are intact Psych: euthymic mood, full affect   EKG:  EKG is ordered today. The ekg ordered today demonstrates normal sinus rhythm with no significant ST or T wave changes.   Recent Labs: 07/25/2016: B Natriuretic Peptide 22.0 07/26/2016: Hemoglobin 14.4; Magnesium 2.1; Platelets 221; TSH 2.524 08/21/2016: BUN 13; Creatinine, Ser 0.76; Potassium 4.8; Sodium 138    Lipid Panel    Component Value Date/Time   CHOL 193 06/11/2016 1504   TRIG 159 (H) 06/11/2016 1504   HDL 40 06/11/2016 1504   CHOLHDL 4.8 (H) 06/11/2016 1504   CHOLHDL 5.7 03/19/2015 0812   VLDL 20 03/19/2015 0812   LDLCALC 121 (H) 06/11/2016 1504  Wt Readings from Last 3 Encounters:  11/11/16 167 lb (75.8 kg)  11/11/16 167 lb (75.8 kg)  08/20/16 164 lb 12 oz (74.7 kg)        No flowsheet data found.    ASSESSMENT AND PLAN:  1.  Palpitations due to PVCs and short runs of SVT: Responded well to small dose metoprolol. Continue same treatment.  2. Tobacco use: I discussed with her the importance of smoking cessation.    Disposition:   FU with m3 in 1 year  Signed,  Andrea Sacramento, MD  11/11/2016 3:23 PM     [facility]

## 2016-11-11 NOTE — Telephone Encounter (Signed)
See refill request.

## 2016-11-11 NOTE — Patient Instructions (Signed)
Continue Anoro once daily.   --Quitting smoking is the most important thing that you can do for your health.  --Quitting smoking will have greater affect on your health than any medicine that we can give you.

## 2016-11-18 ENCOUNTER — Ambulatory Visit (INDEPENDENT_AMBULATORY_CARE_PROVIDER_SITE_OTHER): Payer: Medicare Other | Admitting: Family Medicine

## 2016-11-18 ENCOUNTER — Encounter: Payer: Self-pay | Admitting: Family Medicine

## 2016-11-18 VITALS — BP 122/84 | HR 87 | Temp 97.6°F | Resp 16 | Wt 168.0 lb

## 2016-11-18 DIAGNOSIS — E611 Iron deficiency: Secondary | ICD-10-CM | POA: Insufficient documentation

## 2016-11-18 DIAGNOSIS — I8390 Asymptomatic varicose veins of unspecified lower extremity: Secondary | ICD-10-CM

## 2016-11-18 DIAGNOSIS — R252 Cramp and spasm: Secondary | ICD-10-CM | POA: Diagnosis not present

## 2016-11-18 DIAGNOSIS — G2581 Restless legs syndrome: Secondary | ICD-10-CM | POA: Diagnosis not present

## 2016-11-18 NOTE — Progress Notes (Signed)
Patient: Andrea [name] Female    DOB: 09-Mar-1965   51 y.o.   MRN: [id number] Visit Date: 11/18/2016  Today's Provider: Lelon Huh, MD   Chief Complaint  Patient presents with  . Leg Pain   Subjective:    Leg Pain   Incident onset: 3 months ago. There was no injury mechanism. Pain location: both calves. The quality of the pain is described as cramping. The pain has been intermittent since onset. Associated symptoms include numbness (in fingers). Pertinent negatives include no tingling. Nothing aggravates the symptoms. Treatments tried: stretching. The treatment provided no relief.  Patient states the pain is more in the muscles. She also has on side of arm. Pain in legs occurs when sitting or standing, but not when lying down. No calf pain or burning when walking. No recent medication changes. She states her legs do feel very restless when she lies down and sometimes interferes with sleep.   She also has several varicosities in legs. Which she states are very painful.     Allergies  Allergen Reactions  . Alum & Mag Hydroxide-Simeth Diarrhea and Nausea Only  . Cefdinir Other (See Comments)    tachycardia  . Chantix [Varenicline]     Bad dreams  . Doxycycline Other (See Comments)    Nausea, migraine  . Ivp Dye [Iodinated Diagnostic Agents] Palpitations     Current Outpatient Prescriptions:  .  albuterol (PROVENTIL HFA;VENTOLIN HFA) 108 (90 Base) MCG/ACT inhaler, Inhale 2 puffs into the lungs every 6 (six) hours as needed for wheezing or shortness of breath., Disp: 1 Inhaler, Rfl: 3 .  albuterol (PROVENTIL) (2.5 MG/3ML) 0.083% nebulizer solution, Take 3 mLs (2.5 mg total) by nebulization every 4 (four) hours as needed for wheezing or shortness of breath. Reported on 03/19/2015, Disp: 75 mL, Rfl: 12 .  ALPRAZolam (XANAX) 1 MG tablet, TAKE 1 TABLET BY MOUTH EVERY 8 HOURS AS NEEDED, Disp: 60 tablet, Rfl: 3 .  ARIPiprazole (ABILIFY) 5 MG tablet, Take 5 mg by mouth daily.  Reported on 03/19/2015, Disp: , Rfl:  .  clonazePAM (KLONOPIN) 1 MG tablet, TAKE 1 TABLET BY MOUTH TWICE A DAY AS NEEDED, Disp: 90 tablet, Rfl: 1 .  dexlansoprazole (DEXILANT) 60 MG capsule, Take 1 capsule (60 mg total) by mouth daily., Disp: 30 capsule, Rfl: 6 .  hydroxychloroquine (PLAQUENIL) 200 MG tablet, Take 2 tablets (400 mg total) by mouth daily., Disp: 180 tablet, Rfl: 3 .  metoprolol tartrate (LOPRESSOR) 25 MG tablet, Take 0.5 tablets (12.5 mg total) by mouth 2 (two) times daily., Disp: 90 tablet, Rfl: 3 .  montelukast (SINGULAIR) 10 MG tablet, Take 1 tablet (10 mg total) by mouth at bedtime. Start taking ONE day after finishing prednisone, Disp: 30 tablet, Rfl: 3 .  nitroGLYCERIN (NITROSTAT) 0.4 MG SL tablet, Place 1 tablet (0.4 mg total) under the tongue every 5 (five) minutes as needed for chest pain., Disp: 25 tablet, Rfl: 0 .  Respiratory Therapy Supplies (FLUTTER) DEVI, 1 Device by Does not apply route daily., Disp: 1 each, Rfl: 0 .  sucralfate (CARAFATE) 1 g tablet, TAKE 1 TABLET (1 G TOTAL) BY MOUTH 4 (FOUR) TIMES DAILY - WITH MEALS AND AT BEDTIME., Disp: 28 tablet, Rfl: 11 .  umeclidinium-vilanterol (ANORO ELLIPTA) 62.5-25 MCG/INH AEPB, Inhale 1 puff into the lungs daily., Disp: 60 each, Rfl: 5  Review of Systems  Constitutional: Negative for appetite change, chills, fatigue and fever.  Respiratory: Negative for chest tightness and shortness  of breath.   Cardiovascular: Negative for chest pain and palpitations.  Gastrointestinal: Negative for abdominal pain, nausea and vomiting.  Musculoskeletal: Positive for myalgias (muscle cramps).  Neurological: Positive for numbness (in fingers). Negative for dizziness, tingling and weakness.    Social History  Substance Use Topics  . Smoking status: Current Every Day Smoker    Packs/day: 0.50    Years: 16.00    Types: Cigarettes  . Smokeless tobacco: Never Used     Comment: started smoking 51yo up to 1 ppd  . Alcohol use No    Objective:   BP 122/84 (BP Location: Left Arm, Patient Position: Sitting, Cuff Size: Large)   Pulse 87   Temp 97.6 F (36.4 C) (Oral)   Resp 16   Wt 168 lb (76.2 kg)   SpO2 97% Comment: room air  BMI 29.76 kg/m  There were no vitals filed for this visit.   Physical Exam  General appearance: alert, well developed, well nourished, cooperative and in no distress Head: Normocephalic, without obvious abnormality, atraumatic Respiratory: Respirations even and unlabored, normal respiratory rate Extremities: No gross deformities. +2 bilateral pedal pulses, brisk capillary refill.  Skin: Several tender varicosities of both LEs. No calf tenderness, no swelling, no cords, negative Homan's sign.      Assessment & Plan:     1. Leg cramps  - Ferritin - CK (Creatine Kinase) - Basic metabolic panel  2. Varicose vein of leg  - Ambulatory referral to Vascular Surgery  3. Restless leg  - Ferritin       Lelon Huh, MD  [facility]

## 2016-11-18 NOTE — Patient Instructions (Signed)
Please call the [facility] ([phone]) to schedule a routine screening mammogram.  

## 2016-11-19 ENCOUNTER — Telehealth: Payer: Self-pay | Admitting: Family Medicine

## 2016-11-19 ENCOUNTER — Telehealth: Payer: Self-pay

## 2016-11-19 ENCOUNTER — Other Ambulatory Visit: Payer: Self-pay | Admitting: Family Medicine

## 2016-11-19 DIAGNOSIS — Z1231 Encounter for screening mammogram for malignant neoplasm of breast: Secondary | ICD-10-CM

## 2016-11-19 LAB — BASIC METABOLIC PANEL WITH GFR
BUN: 12 mg/dL (ref 7–25)
CHLORIDE: 102 mmol/L (ref 98–110)
CO2: 28 mmol/L (ref 20–32)
Calcium: 9.8 mg/dL (ref 8.6–10.4)
Creat: 0.7 mg/dL (ref 0.50–1.05)
GFR, Est African American: 115 mL/min/{1.73_m2} (ref >=60)
GFR, Est Non African American: 99 mL/min/{1.73_m2} (ref >=60)
GLUCOSE: 77 mg/dL (ref 65–99)
POTASSIUM: 4.3 mmol/L (ref 3.5–5.3)
SODIUM: 139 mmol/L (ref 135–146)

## 2016-11-19 LAB — EXTRA LAV TOP TUBE

## 2016-11-19 LAB — CK: CK TOTAL: 35 U/L (ref 29–143)

## 2016-11-19 LAB — FERRITIN: FERRITIN: 14 ng/mL (ref 10–232)

## 2016-11-19 NOTE — Telephone Encounter (Signed)
Patient advised as directed below. Per patient she is going to call to schedule follow-up appointment.  Thanks,  -Virgil Lightner

## 2016-11-19 NOTE — Telephone Encounter (Signed)
-----   Message from Birdie Sons, MD sent at 11/19/2016  7:50 AM EDT ----- Her iron level is low which could be causing cramps and restless legs. Need to start taking OTC iron sulfate 325 twice a night. This might cause constipation, in which case she should also take metamucil once a day. Follow up In one month.

## 2016-11-22 ENCOUNTER — Encounter (INDEPENDENT_AMBULATORY_CARE_PROVIDER_SITE_OTHER): Payer: Self-pay | Admitting: Vascular Surgery

## 2016-11-22 ENCOUNTER — Ambulatory Visit (INDEPENDENT_AMBULATORY_CARE_PROVIDER_SITE_OTHER): Payer: Medicare Other | Admitting: Vascular Surgery

## 2016-11-22 VITALS — BP 117/78 | HR 83 | Resp 17 | Ht 63.0 in | Wt 165.0 lb

## 2016-11-22 DIAGNOSIS — E785 Hyperlipidemia, unspecified: Secondary | ICD-10-CM

## 2016-11-22 DIAGNOSIS — I83813 Varicose veins of bilateral lower extremities with pain: Secondary | ICD-10-CM | POA: Insufficient documentation

## 2016-11-22 DIAGNOSIS — Z720 Tobacco use: Secondary | ICD-10-CM

## 2016-11-22 NOTE — Progress Notes (Signed)
Subjective:    Patient ID: Andrea [name], female    DOB: 12-02-1965, 51 y.o.   MRN: [id number] Chief Complaint  Patient presents with  . New Patient (Initial Visit)    Varicose Veins   Presents at the request of Dr. Caryn Section for bilateral lower extremity painful varicose veins. Patient informs me varicose veins have been present for "years" however over the last couple of months, the size and discomfort has progressed. Painful varicose veins associated with edema. This edema is worse towards the end of the day. Currently, the patient does not wear medical grade 1 compression stockings or elevate her legs. Her discomfort has progressive the point she is unable to function on a daily basis. Denies any fever, nausea or vomiting. Denies any claudication rest pain or ulcerations or lower extremity.   Review of Systems  Constitutional: Negative.   HENT: Negative.   Eyes: Negative.   Respiratory: Negative.   Cardiovascular: Positive for leg swelling.       Painful varicose veins  Gastrointestinal: Negative.   Endocrine: Negative.   Genitourinary: Negative.   Musculoskeletal: Negative.   Skin: Negative.   Allergic/Immunologic: Negative.   Neurological: Negative.   Hematological: Negative.   Psychiatric/Behavioral: Negative.       Objective:   Physical Exam  Constitutional: She is oriented to person, place, and time. She appears well-developed and well-nourished. No distress.  HENT:  Head: Normocephalic and atraumatic.  Eyes: Pupils are equal, round, and reactive to light.  Neck: Normal range of motion.  Cardiovascular: Normal rate, regular rhythm, normal heart sounds and intact distal pulses.   Pulses:      Radial pulses are 2+ on the right side, and 2+ on the left side.       Dorsalis pedis pulses are 2+ on the right side, and 2+ on the left side.       Posterior tibial pulses are 2+ on the right side, and 2+ on the left side.  Pulmonary/Chest: Effort normal.  Musculoskeletal:  Normal range of motion. She exhibits no edema.  Neurological: She is alert and oriented to person, place, and time.  Skin: Skin is warm and dry. She is not diaphoretic.  Mixture of less than 1 cm in greater than 1 cm scattered varicose veins noted bilaterally  Psychiatric: She has a normal mood and affect. Her behavior is normal. Judgment and thought content normal.  Vitals reviewed.  BP 117/78 (BP Location: Right Arm)   Pulse 83   Resp 17   Ht 5\' 3"  (1.6 m)   Wt 165 lb (74.8 kg)   BMI 29.23 kg/m   Past Medical History:  Diagnosis Date  . Anxiety    panic attacks, chest pain  . Arthritis   . COPD (chronic obstructive pulmonary disease) ([place])   . GERD (gastroesophageal reflux disease)   . Hypertension    not medicated  . Non-obstructive CAD in native artery    a. cardiac cath 01/2014: showed minor irregularities, mildly elevated left ventricular end-diastolic pressure and normal ejection fraction; b. 03/2015 MV: low risk.  . Palpitations    a. 08/2016 Event Monitor: occas PVC's, two brief runs of SVT (4 and 7 beats).  Marland Kitchen PONV (postoperative nausea and vomiting)   . Symptomatic PVC's (premature ventricular contractions)   . Tobacco abuse   . Wears dentures    partial upper and lower    Social History   Social History  . Marital status: Married    Spouse name: N/A  .  Number of children: 3  . Years of education: N/A   Occupational History  . Disabled     Social Security as of 05/21/2011   Social History Main Topics  . Smoking status: Current Every Day Smoker    Packs/day: 0.50    Years: 16.00    Types: Cigarettes  . Smokeless tobacco: Never Used     Comment: started smoking 51yo up to 1 ppd  . Alcohol use No  . Drug use: No  . Sexual activity: Not on file   Other Topics Concern  . Not on file   Social History Narrative   Lives at home with husband. Independent at baseline.    Past Surgical History:  Procedure Laterality Date  . CARDIAC CATHETERIZATION   01/03/14  . CARDIAC CATHETERIZATION    . ESOPHAGOGASTRODUODENOSCOPY (EGD) WITH PROPOFOL N/A 07/11/2016   Procedure: ESOPHAGOGASTRODUODENOSCOPY (EGD) WITH PROPOFOL;  Surgeon: Lucilla Lame, MD;  Location: [facility];  Service: Endoscopy;  Laterality: N/A;  . EYE SURGERY    . IMAGE GUIDED SINUS SURGERY N/A 11/30/2014   Procedure: IMAGE GUIDED SINUS SURGERY;  Surgeon: Carloyn Manner, MD;  Location: [facility];  Service: ENT;  Laterality: N/A;  GAVE DISK TO CE CE  . MAXILLARY ANTROSTOMY Bilateral 11/30/2014   Procedure: MAXILLARY ANTROSTOMY;  Surgeon: Carloyn Manner, MD;  Location: [facility];  Service: ENT;  Laterality: Bilateral;  . SEPTOPLASTY N/A 11/30/2014   Procedure: SEPTOPLASTY;  Surgeon: Carloyn Manner, MD;  Location: [facility];  Service: ENT;  Laterality: N/A;  . SPHENOIDECTOMY Left 11/30/2014   Procedure: Coralee Pesa, ;  Surgeon: Carloyn Manner, MD;  Location: [facility];  Service: ENT;  Laterality: Left;  Marland Kitchen VAGINAL HYSTERECTOMY  1995   vaginal    Family History  Problem Relation Age of Onset  . Emphysema Father   . Asthma Father   . Heart disease Father   . Heart attack Father   . Arthritis Father        RA  . Colon cancer Father   . Hypertension Mother     Allergies  Allergen Reactions  . Alum & Mag Hydroxide-Simeth Diarrhea and Nausea Only  . Cefdinir Other (See Comments)    tachycardia  . Chantix [Varenicline]     Bad dreams  . Doxycycline Other (See Comments)    Nausea, migraine  . Ivp Dye [Iodinated Diagnostic Agents] Palpitations       Assessment & Plan:  Presents at the request of Dr. Caryn Section for bilateral lower extremity painful varicose veins. Patient informs me varicose veins have been present for "years" however over the last couple of months, the size and discomfort has progressed. Painful varicose veins associated with edema. This edema is worse towards the end of the day. Currently, the patient does not  wear medical grade 1 compression stockings or elevate her legs. Her discomfort has progressive the point she is unable to function on a daily basis. Denies any fever, nausea or vomiting. Denies any claudication rest pain or ulcerations or lower extremity.  1. Varicose veins of bilateral lower extremities with pain - new The patient was encouraged to wear graduated compression stockings (20-30 mmHg) on a daily basis. The patient was instructed to begin wearing the stockings first thing in the morning and removing them in the evening. The patient was instructed specifically not to sleep in the stockings. Prescription given In addition, behavioral modification including elevation during the day will be initiated. Anti-inflammatories for pain. The patient will follow  up in three months to asses conservative management.  Patient to call to make the appointment. The patient was instructed to call the office in the interim if any worsening edema or ulcerations to the legs, feet or toes occurs. The patient expresses their understanding.  - VAS [place] LOWER EXTREMITY VENOUS REFLUX; Future  2. Tobacco abuse - stable We had a discussion for approximately 10 minutes regarding the absolute need for smoking cessation due to the deleterious nature of tobacco on the vascular system. We discussed the tobacco use would diminish patency of any intervention, and likely significantly worsen progressio of disease. We discussed multiple agents for quitting including replacement therapy or medications to reduce cravings such as Chantix. The patient voices their understanding of the importance of smoking cessation.  3. Hyperlipidemia, unspecified hyperlipidemia type - stable Encouraged good control as its slows the progression of atherosclerotic disease  Current Outpatient Prescriptions on File Prior to Visit  Medication Sig Dispense Refill  . albuterol (PROVENTIL HFA;VENTOLIN HFA) 108 (90 Base) MCG/ACT inhaler Inhale 2  puffs into the lungs every 6 (six) hours as needed for wheezing or shortness of breath. 1 Inhaler 3  . albuterol (PROVENTIL) (2.5 MG/3ML) 0.083% nebulizer solution Take 3 mLs (2.5 mg total) by nebulization every 4 (four) hours as needed for wheezing or shortness of breath. Reported on 03/19/2015 75 mL 12  . ALPRAZolam (XANAX) 1 MG tablet TAKE 1 TABLET BY MOUTH EVERY 8 HOURS AS NEEDED 60 tablet 3  . ARIPiprazole (ABILIFY) 5 MG tablet Take 5 mg by mouth daily. Reported on 03/19/2015    . clonazePAM (KLONOPIN) 1 MG tablet TAKE 1 TABLET BY MOUTH TWICE A DAY AS NEEDED 90 tablet 1  . dexlansoprazole (DEXILANT) 60 MG capsule Take 1 capsule (60 mg total) by mouth daily. 30 capsule 6  . hydroxychloroquine (PLAQUENIL) 200 MG tablet Take 2 tablets (400 mg total) by mouth daily. 180 tablet 3  . metoprolol tartrate (LOPRESSOR) 25 MG tablet Take 0.5 tablets (12.5 mg total) by mouth 2 (two) times daily. 90 tablet 3  . montelukast (SINGULAIR) 10 MG tablet Take 1 tablet (10 mg total) by mouth at bedtime. Start taking ONE day after finishing prednisone 30 tablet 3  . nitroGLYCERIN (NITROSTAT) 0.4 MG SL tablet Place 1 tablet (0.4 mg total) under the tongue every 5 (five) minutes as needed for chest pain. 25 tablet 0  . Respiratory Therapy Supplies (FLUTTER) DEVI 1 Device by Does not apply route daily. 1 each 0  . sucralfate (CARAFATE) 1 g tablet TAKE 1 TABLET (1 G TOTAL) BY MOUTH 4 (FOUR) TIMES DAILY - WITH MEALS AND AT BEDTIME. 28 tablet 11  . umeclidinium-vilanterol (ANORO ELLIPTA) 62.5-25 MCG/INH AEPB Inhale 1 puff into the lungs daily. 60 each 5  . [DISCONTINUED] propranolol (INDERAL) 20 MG tablet TAKE 1 TABLET BY MOUTH 3 TIMES A DAY 90 tablet 12   No current facility-administered medications on file prior to visit.     There are no Patient Instructions on file for this visit. No Follow-up on file.   Lakelynn Severtson A Shelly Shoultz, PA-C

## 2016-11-25 ENCOUNTER — Ambulatory Visit
Admission: RE | Admit: 2016-11-25 | Discharge: 2016-11-25 | Disposition: A | Discharge status: Home / Self Care | Payer: Medicare Other | Source: Ambulatory Visit | Attending: Family Medicine | Admitting: Family Medicine

## 2016-11-25 DIAGNOSIS — Z1231 Encounter for screening mammogram for malignant neoplasm of breast: Secondary | ICD-10-CM | POA: Diagnosis not present

## 2017-01-13 ENCOUNTER — Emergency Department: Payer: Medicare Other

## 2017-01-13 ENCOUNTER — Emergency Department
Admission: EM | Admit: 2017-01-13 | Discharge: 2017-01-13 | Disposition: A | Discharge status: Home / Self Care | Payer: Medicare Other | Attending: Emergency Medicine | Admitting: Emergency Medicine

## 2017-01-13 ENCOUNTER — Other Ambulatory Visit: Payer: Self-pay

## 2017-01-13 DIAGNOSIS — I10 Essential (primary) hypertension: Secondary | ICD-10-CM | POA: Insufficient documentation

## 2017-01-13 DIAGNOSIS — R079 Chest pain, unspecified: Secondary | ICD-10-CM | POA: Diagnosis not present

## 2017-01-13 DIAGNOSIS — F17210 Nicotine dependence, cigarettes, uncomplicated: Secondary | ICD-10-CM | POA: Insufficient documentation

## 2017-01-13 DIAGNOSIS — I2510 Atherosclerotic heart disease of native coronary artery without angina pectoris: Secondary | ICD-10-CM | POA: Diagnosis not present

## 2017-01-13 DIAGNOSIS — J441 Chronic obstructive pulmonary disease with (acute) exacerbation: Secondary | ICD-10-CM | POA: Diagnosis not present

## 2017-01-13 DIAGNOSIS — Z79899 Other long term (current) drug therapy: Secondary | ICD-10-CM | POA: Diagnosis not present

## 2017-01-13 DIAGNOSIS — R05 Cough: Secondary | ICD-10-CM | POA: Diagnosis not present

## 2017-01-13 DIAGNOSIS — R059 Cough, unspecified: Secondary | ICD-10-CM

## 2017-01-13 LAB — TROPONIN I

## 2017-01-13 MED ORDER — PREDNISONE 10 MG (21) PO TBPK
ORAL_TABLET | ORAL | 0 refills | Status: DC
Start: 2017-01-13 — End: 2017-03-31

## 2017-01-13 MED ORDER — BENZONATATE 100 MG PO CAPS: 100.0000 mg | ORAL_CAPSULE | Freq: Four times a day (QID) | ORAL | 0 refills | Status: DC | PRN

## 2017-01-13 NOTE — Discharge Instructions (Signed)
Please seek medical attention for any high fevers, chest pain, shortness of breath, change in behavior, persistent vomiting, bloody stool or any other new or concerning symptoms.  

## 2017-01-13 NOTE — ED Triage Notes (Signed)
Pt c/o chest tightness and green productive sputum for last 4 days. Reports increased SOB upon coughing Reports chest tightness remains even when not coughing. Pt in NAD at this time. RR even and unlabored.

## 2017-01-13 NOTE — ED Provider Notes (Signed)
[facility] Emergency Department Provider Note   ____________________________________________   I have reviewed the triage vital signs and the nursing notes.   HISTORY  Chief Complaint Cough and COPD   History limited by: Not Limited   HPI Andrea [name] is a 51 y.o. female who presents to the emergency department today because of concern for cough and shortness of breath.  DURATION:3 days TIMING: constant QUALITY: green sputum CONTEXT: patient states that she has a history of copd. For the past three days has been having cough and shortness of breath. Came to the emergency department this morning because she started having green cough.  MODIFYING FACTORS: tried her home COPD medications without significant relief ASSOCIATED SYMPTOMS: denies fevers. Denies chest pain. Has had chest tightness.   Per medical record review patient has a history of COPD  Past Medical History:  Diagnosis Date  . Anxiety    panic attacks, chest pain  . Arthritis   . COPD (chronic obstructive pulmonary disease) ([place])   . GERD (gastroesophageal reflux disease)   . Hypertension    not medicated  . Non-obstructive CAD in native artery    a. cardiac cath 01/2014: showed minor irregularities, mildly elevated left ventricular end-diastolic pressure and normal ejection fraction; b. 03/2015 MV: low risk.  . Palpitations    a. 08/2016 Event Monitor: occas PVC's, two brief runs of SVT (4 and 7 beats).  Marland Kitchen PONV (postoperative nausea and vomiting)   . Symptomatic PVC's (premature ventricular contractions)   . Tobacco abuse   . Wears dentures    partial upper and lower    Patient Active Problem List   Diagnosis Date Noted  . Varicose veins of bilateral lower extremities with pain 11/22/2016  . Restless leg 11/18/2016  . Allergic rhinitis 06/11/2016  . Hyperlipidemia 03/19/2015  . COPD exacerbation ([place]) 10/04/2014  . Anxiety 09/22/2014  . Depression 09/22/2014  .  Dyshidrosis 09/22/2014  . GERD (gastroesophageal reflux disease) 09/22/2014  . Insomnia 09/22/2014  . Lung nodule, multiple 09/22/2014  . Panic disorder 09/22/2014  . Palpitations 01/17/2014  . Pulmonary nodule 06/10/2013  . Shortness of breath 05/20/2013  . COPD, GOLD B 05/20/2013  . Tobacco abuse 05/20/2013  . Daytime somnolence 05/20/2013  . Back pain 08/28/2012  . Discoid lupus 05/30/2012  . Cellulitis and abscess of face 05/30/2012  . Generalized abdominal pain 03/11/2012  . Pain in joint 03/09/2012  . Psoriasis 02/21/2012  . Fibromyalgia 02/11/2012  . Lupus 02/11/2012  . Panic attacks 02/11/2012  . History of adenomatous polyp of colon 09/13/2011  . Heartburn 08/06/2011  . PVC's (premature ventricular contractions) 01/29/2011  . Essential (primary) hypertension 03/04/1998    Past Surgical History:  Procedure Laterality Date  . CARDIAC CATHETERIZATION  01/03/14  . CARDIAC CATHETERIZATION    . EYE SURGERY    . VAGINAL HYSTERECTOMY  1995   vaginal    Prior to Admission medications   Medication Sig Start Date End Date Taking? Authorizing Provider  albuterol (PROVENTIL HFA;VENTOLIN HFA) 108 (90 Base) MCG/ACT inhaler Inhale 2 puffs into the lungs every 6 (six) hours as needed for wheezing or shortness of breath. 06/11/16   Birdie Sons, MD  albuterol (PROVENTIL) (2.5 MG/3ML) 0.083% nebulizer solution Take 3 mLs (2.5 mg total) by nebulization every 4 (four) hours as needed for wheezing or shortness of breath. Reported on 03/19/2015 03/19/15   Theodoro Grist, MD  ALPRAZolam Duanne Moron) 1 MG tablet TAKE 1 TABLET BY MOUTH EVERY 8 HOURS AS NEEDED  08/08/16   Birdie Sons, MD  ARIPiprazole (ABILIFY) 5 MG tablet Take 5 mg by mouth daily. Reported on 03/19/2015    [provider]  clonazePAM (KLONOPIN) 1 MG tablet TAKE 1 TABLET BY MOUTH TWICE A DAY AS NEEDED 10/24/16   Birdie Sons, MD  dexlansoprazole (DEXILANT) 60 MG capsule Take 1 capsule (60 mg total) by mouth daily.  07/24/16   Lucilla Lame, MD  hydroxychloroquine (PLAQUENIL) 200 MG tablet Take 2 tablets (400 mg total) by mouth daily. 02/12/16   Birdie Sons, MD  metoprolol tartrate (LOPRESSOR) 25 MG tablet Take 0.5 tablets (12.5 mg total) by mouth 2 (two) times daily. 08/20/16   Rogelia Mire, NP  montelukast (SINGULAIR) 10 MG tablet Take 1 tablet (10 mg total) by mouth at bedtime. Start taking ONE day after finishing prednisone 06/11/16   Birdie Sons, MD  nitroGLYCERIN (NITROSTAT) 0.4 MG SL tablet Place 1 tablet (0.4 mg total) under the tongue every 5 (five) minutes as needed for chest pain. 12/08/14   Wellington Hampshire, MD  Respiratory Therapy Supplies (FLUTTER) DEVI 1 Device by Does not apply route daily. 03/19/15   Theodoro Grist, MD  sucralfate (CARAFATE) 1 g tablet TAKE 1 TABLET (1 G TOTAL) BY MOUTH 4 (FOUR) TIMES DAILY - WITH MEALS AND AT BEDTIME. 11/11/16   Birdie Sons, MD  umeclidinium-vilanterol (ANORO ELLIPTA) 62.5-25 MCG/INH AEPB Inhale 1 puff into the lungs daily. 07/04/16   Laverle Hobby, MD    Allergies Alum & mag hydroxide-simeth; Cefdinir; Chantix [varenicline]; Doxycycline; and Ivp dye [iodinated diagnostic agents]  Family History  Problem Relation Age of Onset  . Emphysema Father   . Asthma Father   . Heart disease Father   . Heart attack Father   . Arthritis Father        RA  . Colon cancer Father   . Hypertension Mother   . Breast cancer Neg Hx     Social History Social History   Tobacco Use  . Smoking status: Current Every Day Smoker    Packs/day: 0.50    Years: 16.00    Pack years: 8.00    Types: Cigarettes  . Smokeless tobacco: Never Used  . Tobacco comment: started smoking 51yo up to 1 ppd  Substance Use Topics  . Alcohol use: No    Alcohol/week: 0.0 oz  . Drug use: No    Review of Systems Constitutional: No fever Eyes: No visual changes. ENT: No sore throat. Cardiovascular: Denies chest pain. Positive for chest tightness. Respiratory:  Positive for cough and shortness of breath. Gastrointestinal: No abdominal pain.  No nausea, no vomiting.  No diarrhea.   Genitourinary: Negative for dysuria. Musculoskeletal: Negative for back pain. Skin: Negative for rash. Neurological: Negative for headaches, focal weakness or numbness.  ____________________________________________   PHYSICAL EXAM:  VITAL SIGNS: ED Triage Vitals  Enc Vitals Group     BP 01/13/17 0753 135/79     Pulse Rate 01/13/17 0753 (!) 102     Resp 01/13/17 0753 20     Temp 01/13/17 0753 98.3 F (36.8 C)     Temp Source 01/13/17 0753 Oral     SpO2 01/13/17 0753 96 %     Weight 01/13/17 0754 152 lb (68.9 kg)     Height 01/13/17 0754 5\' 3"  (1.6 m)     Head Circumference --      Peak Flow --      Pain Score 01/13/17 0753 7   Constitutional:  Alert and oriented. Well appearing and in no distress. Eyes: Conjunctivae are normal.  ENT   Head: Normocephalic and atraumatic.   Nose: No congestion/rhinnorhea.   Mouth/Throat: Mucous membranes are moist.   Neck: No stridor. Hematological/Lymphatic/Immunilogical: No cervical lymphadenopathy. Cardiovascular: Normal rate, regular rhythm.  No murmurs, rubs, or gallops.  Respiratory: Normal respiratory effort without tachypnea nor retractions. Positive for diffuse expiratory wheezing. Gastrointestinal: Soft and non tender. No rebound. No guarding.  Genitourinary: Deferred Musculoskeletal: Normal range of motion in all extremities. No lower extremity edema. Neurologic:  Normal speech and language. No gross focal neurologic deficits are appreciated.  Skin:  Skin is warm, dry and intact. No rash noted. Psychiatric: Mood and affect are normal. Speech and behavior are normal. Patient exhibits appropriate insight and judgment.  ____________________________________________    LABS (pertinent positives/negatives)  Trop <0.03 CBC wbc 10.9, hgb 14.4 BMP glu  109  ____________________________________________   EKG  I, Nance Pear, attending physician, personally viewed and interpreted this EKG  EKG Time: 0757 Rate: 105 Rhythm: sinus tachycardia Axis: normal Intervals: qtc 417 QRS: narrow ST changes: no st elevation Impression: right atrial enlargement   ____________________________________________    RADIOLOGY  CXR No acute disease  ____________________________________________   PROCEDURES  Procedures  ____________________________________________   INITIAL IMPRESSION / ASSESSMENT AND PLAN / ED COURSE  Pertinent labs & imaging results that were available during my care of the patient were reviewed by me and considered in my medical decision making (see chart for details).  Differential includes, but is not limited to, viral syndrome, bronchitis including COPD exacerbation, pneumonia, reactive airway disease including asthma, CHF including exacerbation with or without pulmonary/interstitial edema, pneumothorax, ACS, thoracic trauma, and pulmonary embolism.   Exam and work up most consistent with COPD exacerbation. Discussed this with patient. Will plan on prescribing steroids and cough medication. Discussed with patient pneumonia return precautions.     ____________________________________________   FINAL CLINICAL IMPRESSION(S) / ED DIAGNOSES  Final diagnoses:  COPD exacerbation ([place])  Cough     Note: This dictation was prepared with Dragon dictation. Any transcriptional errors that result from this process are unintentional     Nance Pear, MD 01/13/17 [phone]

## 2017-01-14 ENCOUNTER — Encounter: Payer: Self-pay | Admitting: Family Medicine

## 2017-01-14 ENCOUNTER — Ambulatory Visit (INDEPENDENT_AMBULATORY_CARE_PROVIDER_SITE_OTHER): Payer: Medicare Other | Admitting: Family Medicine

## 2017-01-14 ENCOUNTER — Telehealth: Payer: Self-pay | Admitting: Family Medicine

## 2017-01-14 VITALS — BP 122/60 | HR 96 | Temp 98.1°F | Resp 20 | Wt 168.0 lb

## 2017-01-14 DIAGNOSIS — J40 Bronchitis, not specified as acute or chronic: Secondary | ICD-10-CM

## 2017-01-14 DIAGNOSIS — J441 Chronic obstructive pulmonary disease with (acute) exacerbation: Secondary | ICD-10-CM

## 2017-01-14 DIAGNOSIS — J0100 Acute maxillary sinusitis, unspecified: Secondary | ICD-10-CM | POA: Diagnosis not present

## 2017-01-14 MED ORDER — AZITHROMYCIN 250 MG PO TABS: ORAL_TABLET | ORAL | 0 refills | Status: AC

## 2017-01-14 NOTE — Telephone Encounter (Signed)
Pt was discharged from [facility] yesterday for COPD and cough. Pt is scheduled to come in today/MW

## 2017-01-14 NOTE — Progress Notes (Signed)
Patient: Andrea [name] Female    DOB: 1965-10-02   52 y.o.   MRN: [id number] Visit Date: 01/14/2017  Today's Provider: Lelon Huh, MD   Chief Complaint  Patient presents with  . Hospitalization Follow-up   Subjective:    HPI   Follow up ER visit  Patient was seen in ER for COPD exacerbation and cough on 01/13/2017. She was treated for COPD exacerbation and cough. Treatment for this included; patient was prescribed 6 days prednisone taper and tessalon for cough.  She reports good compliance with treatment. She reports this condition is Unchanged. Patient reports that she have developed sinus pressure this morning. She denies fever. She has been taking Mucinex with no relief.      Allergies  Allergen Reactions  . Alum & Mag Hydroxide-Simeth Diarrhea and Nausea Only  . Cefdinir Other (See Comments)    tachycardia  . Chantix [Varenicline]     Bad dreams  . Doxycycline Other (See Comments)    Nausea, migraine  . Ivp Dye [Iodinated Diagnostic Agents] Palpitations     Current Outpatient Medications:  .  albuterol (PROVENTIL HFA;VENTOLIN HFA) 108 (90 Base) MCG/ACT inhaler, Inhale 2 puffs into the lungs every 6 (six) hours as needed for wheezing or shortness of breath., Disp: 1 Inhaler, Rfl: 3 .  albuterol (PROVENTIL) (2.5 MG/3ML) 0.083% nebulizer solution, Take 3 mLs (2.5 mg total) by nebulization every 4 (four) hours as needed for wheezing or shortness of breath. Reported on 03/19/2015, Disp: 75 mL, Rfl: 12 .  ALPRAZolam (XANAX) 1 MG tablet, TAKE 1 TABLET BY MOUTH EVERY 8 HOURS AS NEEDED, Disp: 60 tablet, Rfl: 3 .  ARIPiprazole (ABILIFY) 5 MG tablet, Take 5 mg by mouth daily. Reported on 03/19/2015, Disp: , Rfl:  .  benzonatate (TESSALON PERLES) 100 MG capsule, Take 1 capsule (100 mg total) every 6 (six) hours as needed by mouth for cough., Disp: 20 capsule, Rfl: 0 .  clonazePAM (KLONOPIN) 1 MG tablet, TAKE 1 TABLET BY MOUTH TWICE A DAY AS NEEDED, Disp: 90 tablet,  Rfl: 1 .  dexlansoprazole (DEXILANT) 60 MG capsule, Take 1 capsule (60 mg total) by mouth daily., Disp: 30 capsule, Rfl: 6 .  hydroxychloroquine (PLAQUENIL) 200 MG tablet, Take 2 tablets (400 mg total) by mouth daily., Disp: 180 tablet, Rfl: 3 .  metoprolol tartrate (LOPRESSOR) 25 MG tablet, Take 0.5 tablets (12.5 mg total) by mouth 2 (two) times daily., Disp: 90 tablet, Rfl: 3 .  montelukast (SINGULAIR) 10 MG tablet, Take 1 tablet (10 mg total) by mouth at bedtime. Start taking ONE day after finishing prednisone, Disp: 30 tablet, Rfl: 3 .  nitroGLYCERIN (NITROSTAT) 0.4 MG SL tablet, Place 1 tablet (0.4 mg total) under the tongue every 5 (five) minutes as needed for chest pain., Disp: 25 tablet, Rfl: 0 .  predniSONE (STERAPRED UNI-PAK 21 TAB) 10 MG (21) TBPK tablet, Per packaging instructions, Disp: 21 tablet, Rfl: 0 .  Respiratory Therapy Supplies (FLUTTER) DEVI, 1 Device by Does not apply route daily., Disp: 1 each, Rfl: 0 .  sucralfate (CARAFATE) 1 g tablet, TAKE 1 TABLET (1 G TOTAL) BY MOUTH 4 (FOUR) TIMES DAILY - WITH MEALS AND AT BEDTIME., Disp: 28 tablet, Rfl: 11 .  umeclidinium-vilanterol (ANORO ELLIPTA) 62.5-25 MCG/INH AEPB, Inhale 1 puff into the lungs daily., Disp: 60 each, Rfl: 5  Review of Systems  Constitutional: Positive for fatigue. Negative for appetite change, chills and fever.  HENT: Positive for congestion, sinus pressure, sinus pain,  sore throat and voice change.   Respiratory: Positive for cough, chest tightness and wheezing. Negative for shortness of breath.   Cardiovascular: Negative for chest pain and palpitations.  Gastrointestinal: Negative for abdominal pain, nausea and vomiting.  Neurological: Positive for headaches. Negative for dizziness and weakness.    Social History   Tobacco Use  . Smoking status: Current Every Day Smoker    Packs/day: 0.50    Years: 16.00    Pack years: 8.00    Types: Cigarettes  . Smokeless tobacco: Never Used  . Tobacco comment:  started smoking 51yo up to 1 ppd  Substance Use Topics  . Alcohol use: No    Alcohol/week: 0.0 oz   Objective:   BP 122/60 (BP Location: Right Arm, Patient Position: Sitting, Cuff Size: Normal)   Pulse 96   Temp 98.1 F (36.7 C)   Resp 20   Wt 168 lb (76.2 kg)   SpO2 98%   BMI 29.76 kg/m  Vitals:   01/14/17 1434  BP: 122/60  Pulse: 96  Resp: 20  Temp: 98.1 F (36.7 C)  SpO2: 98%  Weight: 168 lb (76.2 kg)     Physical Exam  General Appearance:    Alert, cooperative, no distress  HENT:   bilateral TM normal without fluid or infection, neck without nodes, throat normal without erythema or exudate and nasal mucosa pale and congested maxillary sinuses congestion  Eyes:    PERRL, conjunctiva/corneas clear, EOM's intact       Lungs:     Occasional expiratory wheeze, no rales, , respirations unlabored  Heart:    Regular rate and rhythm  Neurologic:   Awake, alert, oriented x 3. No apparent focal neurological           defect.           Assessment & Plan:  1. Bronchitis   2. Acute maxillary sinusitis, recurrence not specified  - azithromycin (ZITHROMAX) 250 MG tablet; 2 by mouth today, then 1 daily for 4 days  Dispense: 6 tablet; Refill: 0  3. COPD exacerbation (HCC) Continue prednisone taper, tessalon, prn cough, and inhalers.  She declined flu vaccine due to illness.        Lelon Huh, MD  [facility]

## 2017-01-20 ENCOUNTER — Telehealth: Payer: Self-pay | Admitting: Family Medicine

## 2017-01-20 MED ORDER — LEVOFLOXACIN 500 MG PO TABS: 500.0000 mg | ORAL_TABLET | Freq: Every day | ORAL | 0 refills | Status: DC

## 2017-01-20 NOTE — Telephone Encounter (Signed)
Pt given z-pack Tuesday last week.  Pt states her symptoms are still not any better.  Pt still having sinus in head, ear pain and coughing.  Pt wants to know what to do and if she needs to come back in or if RX can be called in.

## 2017-01-20 NOTE — Telephone Encounter (Signed)
Advised patient. Medication sent into the pharmacy.

## 2017-01-20 NOTE — Telephone Encounter (Signed)
Please review. Thanks!  

## 2017-01-20 NOTE — Telephone Encounter (Signed)
Change to levofloxacin 500mg  once a day x 7 days.

## 2017-01-29 LAB — BASIC METABOLIC PANEL
ANION GAP: 9 (ref 5–15)
BUN: 15 mg/dL (ref 6–20)
CALCIUM: 9.2 mg/dL (ref 8.9–10.3)
CO2: 25 mmol/L (ref 22–32)
Chloride: 101 mmol/L (ref 101–111)
Creatinine, Ser: 0.79 mg/dL (ref 0.61–1.24)
GFR calc non Af Amer: >60 mL/min (ref >60)
Glucose, Bld: 109 mg/dL — ABNORMAL HIGH (ref 65–99)
POTASSIUM: 3.9 mmol/L (ref 3.5–5.1)
Sodium: 135 mmol/L (ref 135–145)

## 2017-01-29 LAB — CBC
HEMATOCRIT: 42.5 % (ref 40.0–52.0)
HEMOGLOBIN: 14.4 g/dL (ref 13.0–18.0)
MCH: 26.9 pg (ref 26.0–34.0)
MCHC: 34 g/dL (ref 32.0–36.0)
MCV: 79.4 fL — AB (ref 80.0–100.0)
Platelets: 231 10*3/uL (ref 150–440)
RBC: 5.35 MIL/uL (ref 4.40–5.90)
RDW: 15.3 % — ABNORMAL HIGH (ref 11.5–14.5)
WBC: 10.9 10*3/uL — ABNORMAL HIGH (ref 3.8–10.6)

## 2017-02-14 ENCOUNTER — Other Ambulatory Visit: Payer: Self-pay | Admitting: *Deleted

## 2017-02-14 MED ORDER — HYDROXYCHLOROQUINE SULFATE 200 MG PO TABS: 400.0000 mg | ORAL_TABLET | Freq: Every day | ORAL | 2 refills | Status: DC

## 2017-02-14 MED ORDER — CLONAZEPAM 1 MG PO TABS: 1.0000 mg | ORAL_TABLET | Freq: Two times a day (BID) | ORAL | 3 refills | Status: DC | PRN

## 2017-02-27 ENCOUNTER — Encounter: Payer: Self-pay | Admitting: Family Medicine

## 2017-03-02 ENCOUNTER — Other Ambulatory Visit: Payer: Self-pay | Admitting: Family Medicine

## 2017-03-19 ENCOUNTER — Telehealth: Payer: Self-pay | Admitting: Family Medicine

## 2017-03-20 NOTE — Telephone Encounter (Signed)
No callback rec to date

## 2017-03-25 ENCOUNTER — Telehealth: Payer: Self-pay | Admitting: Cardiovascular Disease

## 2017-03-25 NOTE — Telephone Encounter (Signed)
S/w patient. She has been having episodes of her heart racing anytime she eats. She says it lasts for 45 min to an hour. Accompanying symptoms are shortness of breath and some chest "pulling" or tightness. She is down to eating just one meal a day. Even a small bite of something, such as potato salad, triggers the racing heart rate. Patient does not physically palpate the racing heart rate and does not have a BP cuff to measure. Sometimes the sensation will wake her up at night as well. I asked her if she is anxious before the racing starts and she says the racing heart rate makes her anxious. She is currently taking metoprolol 12.5 mg BID and says she is taking it consistently. Advised I will route to Dr Fletcher Anon for advice.

## 2017-03-25 NOTE — Telephone Encounter (Signed)
Patient states that every time she eats her heart begins to race  Feels like her heart is pounding out of chest and throat She is tired of not being able to eat - not sure what is causing it Please call to discuss

## 2017-03-27 MED ORDER — METOPROLOL TARTRATE 25 MG PO TABS: 25.0000 mg | ORAL_TABLET | Freq: Two times a day (BID) | ORAL | 3 refills | Status: DC

## 2017-03-27 NOTE — Telephone Encounter (Signed)
S/w patient. She verbalized understanding to increase Metoprolol to 25 mg BID and to let us know if no improvement. Rx sent to pharmacy.

## 2017-03-27 NOTE — Telephone Encounter (Signed)
Increase metoprolol to 25 mg bid and let us know if no improvement.

## 2017-03-31 ENCOUNTER — Telehealth: Payer: Self-pay | Admitting: Family Medicine

## 2017-03-31 ENCOUNTER — Telehealth: Payer: Self-pay | Admitting: Cardiovascular Disease

## 2017-03-31 ENCOUNTER — Encounter: Payer: Self-pay | Admitting: Internal Medicine

## 2017-03-31 ENCOUNTER — Ambulatory Visit (INDEPENDENT_AMBULATORY_CARE_PROVIDER_SITE_OTHER): Payer: Medicare Other | Admitting: Internal Medicine

## 2017-03-31 VITALS — BP 114/76 | HR 68 | Resp 16 | Ht 63.0 in | Wt 164.0 lb

## 2017-03-31 DIAGNOSIS — F411 Generalized anxiety disorder: Secondary | ICD-10-CM | POA: Diagnosis not present

## 2017-03-31 DIAGNOSIS — R0609 Other forms of dyspnea: Secondary | ICD-10-CM

## 2017-03-31 DIAGNOSIS — Z720 Tobacco use: Secondary | ICD-10-CM

## 2017-03-31 DIAGNOSIS — J438 Other emphysema: Secondary | ICD-10-CM

## 2017-03-31 DIAGNOSIS — F17210 Nicotine dependence, cigarettes, uncomplicated: Secondary | ICD-10-CM

## 2017-03-31 DIAGNOSIS — J449 Chronic obstructive pulmonary disease, unspecified: Secondary | ICD-10-CM

## 2017-03-31 NOTE — Telephone Encounter (Signed)
Patient would like to know if the medication metoprolol that she was just put on will interact with plaquenil She has not been having any issues she would just like to discuss Please call

## 2017-03-31 NOTE — Telephone Encounter (Signed)
Please advise 

## 2017-03-31 NOTE — Patient Instructions (Signed)
Will check CXR.   Start on the medication as prescribed by your cardiologist.

## 2017-03-31 NOTE — Telephone Encounter (Signed)
[facility] [address] will not fill pt's RX due to a new state law stating pharmacy's must have a letter on file from the MD stating why the patient needs to take xanax and klonopin.  Pt states the pharmacy is to be faxing something over as well.

## 2017-03-31 NOTE — Telephone Encounter (Signed)
S/w patient. She was recently started on Metoprolol.  She also take Plaquenil (which she's been on for 10-15 years.).  She wants to make sure there is not interaction she should be aware of. She says the pamphlets reads there could be an interaction. Advised I will route to Dr Fletcher Anon for advice.

## 2017-03-31 NOTE — Progress Notes (Signed)
[facility] Note     Assessment and Plan:  The patient is a 52 year old smoker with moderate COPD, and multiple COPD exacerbations in the past. Patient also has a significant anxiety component.  Palpitations. -Discussed that the abnormal sensation she is having in her chest may be due to palpitations secondary to SVT with irregularly irregular heartbeat.  She has not been taking the medications as prescribed by her cardiologist. -We discussed that it is important to take the medications, and report any side effects or difficulties with them to her cardiologist, she is cautioned that if she stops medication she needs to inform her cardiologist about this. -She notes a sensation of "twisting" in her left lung, and a sensation that she cannot move air into her lungs.  She is reassured that she is moving air into her lungs as expected today on auscultation.  Her sensation may be secondary to SVT.  Tobacco abuse --Discussed importance of smoking cessation today. Greater than 3 minutes spent in discussion.   COPD, group D with multiple exacerbations.- --Continue Anoro once daily.  Anxiety --I suspect this may be playing a role in her dyspnea in addition to emphysema.  -Continue current treatment.  Pulmonary nodule 06/2013 CT high res chest> mild centrilobular emphysema but no ILD, multiple scattered pulm nodules all 62mm in size 07/2014 CT chest > nodule unchanged no further follow up recommended.   Daytime somnolence 05/2013 Sleep study [facility] > AHI 3.8  GERD. -Continue Dexilant, continue follow-up with gastroenterology.  Orders Placed This Encounter  Procedures  . DG Chest 2 View     HPI:   [name] is a 52 year old Caucasian female with severe COPD, which appears to be complicated by anxiety.  She feels that she has an abnormal sensation in her left lung and she can not get air in her left lung. She has trouble sleeping at night, it is present all through the  day, and she occasionally gets light headed.  She also feels that a round ball is caught in her neck, lasted for 4 or 5 seconds and then disappeared, she also feels that someone is squeezing most of the air out of her lungs. This all started in the past week. She called Dr. Fletcher Anon and her metoprolol was increased to 25 bid, but she instead stopped this and started propranolol instead.  She noted that when she started the metoprolol it made her feel cold all over, therefore she stopped it, but she did not inform her cardiologist.  She is smoking less than a ppd.She has GERD, she  is currently taking Dexilant.   **Review of tracings personally, 06/18/16 PFT: FVC is 82%, FEV1 is a 66% without significant reversibility, ratio is 60%. Lung volumes are unremarkable. Diffusion capacity is 57% of predicted. Flow volume loop is consistent with obstruction. Overall, this testing is consistent with moderate objective lung disease with an FEV1 of 60%.  **Chest x-ray  06/19/14 ; showed emphysematous changes, otherwise unremarkable with hyperinflated lungs, minimal difference in comparison with previous films.  CT chest from 06/10/2013 was reviewed that showed some scattered emphysematous changes but otherwise unremarkable without evidence of bronchiectasis.  Desat walk 07/04/2016 Baseline O2 was 96% and HR 87 at rest on RA. After walking 600 feet sat was 94% and HR 98 on RA. Pt complained of mild dyspnea, but appeared comfortable and was speaking full sentences.   Medication:    Reviewed   Allergies:  Alum & mag hydroxide-simeth; Cefdinir; Chantix [varenicline]; Doxycycline;  and Ivp dye [iodinated diagnostic agents]  Review of Systems: Gen:  Denies  fever, sweats, chills HEENT: Denies blurred vision, double vision, ear pain, eye pain, hearing loss, nose bleeds, sore throat Cvc:  No dizziness, chest pain or heaviness Resp:   Denies cough or sputum porduction, shortness of breath Gi: Denies swallowing  difficulty, stomach pain, nausea or vomiting, diarrhea, constipation, bowel incontinence Gu:  Denies bladder incontinence, burning urine Ext:   No Joint pain, stiffness or swelling Skin: No skin rash, easy bruising or bleeding or hives Endoc:  No polyuria, polydipsia , polyphagia or weight change Psych: No depression, insomnia or hallucinations  Other:  All other systems negative  Physical Examination:   VS: BP 114/76 (BP Location: Left Arm, Cuff Size: Normal)   Pulse (!) 47   Resp 16   Ht 5\' 3"  (1.6 m)   Wt 164 lb (74.4 kg)   SpO2 97%   BMI 29.05 kg/m   General Appearance: No distress  Neuro:without focal findings,  speech normal,  HEENT: PERRLA, EOM intact.   Pulmonary: normal breath sounds, No wheezing, No rales;    CardiovascularNormal S1,S2.  Heart rate is below 100 but irregularly irregular. Abdomen: Benign, Soft, non-tender. Renal:  No costovertebral tenderness  GU:  No performed at this time. Endoc: No evident thyromegaly Skin:   warm, no rashes, no ecchymosis  Extremities: normal, no cyanosis, clubbing.        Thank  you for the consultation and for allowing [facility] to assist in the care of your patient. Our recommendations are noted above.  Please contact us if we can be of further service.   Marda Stalker, MD.  Board Certified in Internal Medicine, Pulmonary Medicine, [place], and Sleep Medicine.  [facility] Office Number: [phone]  Patricia Pesa, M.D.  Cheral Marker, M.D

## 2017-04-02 NOTE — Telephone Encounter (Signed)
There is a mild interaction but not significant. We can use together but we should not increase Metoprolol to a higher dose than 25-50 mg bid.

## 2017-04-03 NOTE — Telephone Encounter (Signed)
Notified patient and she verbalized understanding of Dr Tyrell Antonio advice.

## 2017-04-07 NOTE — Telephone Encounter (Signed)
2nd call on for refills on Klonopin. She has 2 days worth left and cannot go without since she has been on them so long.   She is taking Xanax prn if a panic attack starts  and she said if this will continue to be a problem, to d/c her pres for Xanax so she can continue to get the Klonopin.   Or she [facility] [address] saying why she is on both medications.

## 2017-04-08 NOTE — Telephone Encounter (Signed)
[facility] need [facility] and codes be added. The rest os the letter is fine.

## 2017-04-08 NOTE — Telephone Encounter (Addendum)
[facility] their system is down right now and asked that I call back later.

## 2017-04-08 NOTE — Telephone Encounter (Signed)
Letter has been printed. Please call pharmacy and see if this is what they need. Thanks.

## 2017-04-08 NOTE — Telephone Encounter (Signed)
Ok to refill? Please advise. Thanks!  

## 2017-05-07 ENCOUNTER — Ambulatory Visit: Payer: Medicare Other | Admitting: Gastroenterology

## 2017-05-12 NOTE — Telephone Encounter (Signed)
This encounter was created in error - please disregard.

## 2017-05-17 ENCOUNTER — Emergency Department
Admission: EM | Admit: 2017-05-17 | Discharge: 2017-05-17 | Disposition: A | Discharge status: Home / Self Care | Payer: Medicare Other | Attending: Emergency Medicine | Admitting: Emergency Medicine

## 2017-05-17 ENCOUNTER — Other Ambulatory Visit: Payer: Self-pay

## 2017-05-17 ENCOUNTER — Encounter: Payer: Self-pay | Admitting: Emergency Medicine

## 2017-05-17 DIAGNOSIS — F17210 Nicotine dependence, cigarettes, uncomplicated: Secondary | ICD-10-CM | POA: Insufficient documentation

## 2017-05-17 DIAGNOSIS — L237 Allergic contact dermatitis due to plants, except food: Secondary | ICD-10-CM | POA: Insufficient documentation

## 2017-05-17 DIAGNOSIS — I10 Essential (primary) hypertension: Secondary | ICD-10-CM | POA: Insufficient documentation

## 2017-05-17 DIAGNOSIS — Z79899 Other long term (current) drug therapy: Secondary | ICD-10-CM | POA: Diagnosis not present

## 2017-05-17 DIAGNOSIS — J449 Chronic obstructive pulmonary disease, unspecified: Secondary | ICD-10-CM | POA: Insufficient documentation

## 2017-05-17 DIAGNOSIS — R21 Rash and other nonspecific skin eruption: Secondary | ICD-10-CM | POA: Diagnosis present

## 2017-05-17 MED ORDER — DIPHENHYDRAMINE HCL 50 MG/ML IJ SOLN
25.0000 mg | Freq: Once | INTRAMUSCULAR | Status: AC
  Administered 2017-05-17: 25 mg via INTRAMUSCULAR

## 2017-05-17 MED ORDER — DIPHENHYDRAMINE HCL 50 MG/ML IJ SOLN
25.0000 mg | Freq: Once | INTRAMUSCULAR | Status: DC
  Filled 2017-05-17: qty 1

## 2017-05-17 MED ORDER — FAMOTIDINE 20 MG PO TABS: 20.0000 mg | ORAL_TABLET | Freq: Two times a day (BID) | ORAL | 1 refills | Status: DC

## 2017-05-17 MED ORDER — DIPHENHYDRAMINE HCL 25 MG PO TABS: 25.0000 mg | ORAL_TABLET | Freq: Four times a day (QID) | ORAL | 0 refills | Status: DC | PRN

## 2017-05-17 MED ORDER — METHYLPREDNISOLONE SODIUM SUCC 125 MG IJ SOLR
125.0000 mg | Freq: Once | INTRAMUSCULAR | Status: AC
  Administered 2017-05-17: 125 mg via INTRAMUSCULAR
  Filled 2017-05-17: qty 2

## 2017-05-17 MED ORDER — PREDNISONE 10 MG (21) PO TBPK: ORAL_TABLET | ORAL | 0 refills | Status: DC

## 2017-05-17 MED ORDER — FAMOTIDINE 20 MG PO TABS
20.0000 mg | ORAL_TABLET | Freq: Once | ORAL | Status: AC
  Administered 2017-05-17: 20 mg via ORAL
  Filled 2017-05-17: qty 1

## 2017-05-17 NOTE — ED Triage Notes (Signed)
Got into poison ivy or oak 3 days ago - scratching in triage. Last took benadryl yesterday

## 2017-05-17 NOTE — ED Provider Notes (Signed)
[facility] Emergency Department Provider Note  ____________________________________________  Time seen: Approximately 4:59 PM  I have reviewed the triage vital signs and the nursing notes.   HISTORY  Chief Complaint Poison Ivy    HPI Andrea [name] is a 52 y.o. female presents to the emergency department with poison ivy dermatitis.  Patient reports poison ivy dermatitis of the face, extremities and lower extremities.  Patient reports that her son was burning brush when symptoms started to develop.  She has not experienced any difficulty swallowing.  She denies shortness of breath, nausea, vomiting abdominal pain.  No diarrhea.  No alleviating measures have been attempted.  Past Medical History:  Diagnosis Date  . Anxiety    panic attacks, chest pain  . Arthritis   . COPD (chronic obstructive pulmonary disease) ([place])   . GERD (gastroesophageal reflux disease)   . Hypertension    not medicated  . Non-obstructive CAD in native artery    a. cardiac cath 01/2014: showed minor irregularities, mildly elevated left ventricular end-diastolic pressure and normal ejection fraction; b. 03/2015 MV: low risk.  . Palpitations    a. 08/2016 Event Monitor: occas PVC's, two brief runs of SVT (4 and 7 beats).  Marland Kitchen PONV (postoperative nausea and vomiting)   . Symptomatic PVC's (premature ventricular contractions)   . Tobacco abuse   . Wears dentures    partial upper and lower    Patient Active Problem List   Diagnosis Date Noted  . Varicose veins of bilateral lower extremities with pain 11/22/2016  . Restless leg 11/18/2016  . Iron deficiency 11/18/2016  . Allergic rhinitis 06/11/2016  . Hyperlipidemia 03/19/2015  . COPD exacerbation ([place]) 10/04/2014  . Anxiety 09/22/2014  . Depression 09/22/2014  . Dyshidrosis 09/22/2014  . GERD (gastroesophageal reflux disease) 09/22/2014  . Insomnia 09/22/2014  . Lung nodule, multiple 09/22/2014  . Panic disorder 09/22/2014  .  Palpitations 01/17/2014  . Pulmonary nodule 06/10/2013  . Shortness of breath 05/20/2013  . COPD, GOLD B 05/20/2013  . Tobacco abuse 05/20/2013  . Daytime somnolence 05/20/2013  . Back pain 08/28/2012  . Discoid lupus 05/30/2012  . Cellulitis and abscess of face 05/30/2012  . Generalized abdominal pain 03/11/2012  . Pain in joint 03/09/2012  . Psoriasis 02/21/2012  . Fibromyalgia 02/11/2012  . Lupus 02/11/2012  . Panic attacks 02/11/2012  . History of adenomatous polyp of colon 09/13/2011  . Heartburn 08/06/2011  . PVC's (premature ventricular contractions) 01/29/2011  . Essential (primary) hypertension 03/04/1998    Past Surgical History:  Procedure Laterality Date  . CARDIAC CATHETERIZATION  01/03/14  . CARDIAC CATHETERIZATION    . ESOPHAGOGASTRODUODENOSCOPY (EGD) WITH PROPOFOL N/A 07/11/2016   Procedure: ESOPHAGOGASTRODUODENOSCOPY (EGD) WITH PROPOFOL;  Surgeon: Lucilla Lame, MD;  Location: [place];  Service: Endoscopy;  Laterality: N/A;  . EYE SURGERY    . IMAGE GUIDED SINUS SURGERY N/A 11/30/2014   Procedure: IMAGE GUIDED SINUS SURGERY;  Surgeon: Carloyn Manner, MD;  Location: [place];  Service: ENT;  Laterality: N/A;  GAVE DISK TO CE CE  . MAXILLARY ANTROSTOMY Bilateral 11/30/2014   Procedure: MAXILLARY ANTROSTOMY;  Surgeon: Carloyn Manner, MD;  Location: [place];  Service: ENT;  Laterality: Bilateral;  . SEPTOPLASTY N/A 11/30/2014   Procedure: SEPTOPLASTY;  Surgeon: Carloyn Manner, MD;  Location: [place];  Service: ENT;  Laterality: N/A;  . SPHENOIDECTOMY Left 11/30/2014   Procedure: Coralee Pesa, ;  Surgeon: Carloyn Manner, MD;  Location: [place];  Service: ENT;  Laterality: Left;  Marland Kitchen VAGINAL HYSTERECTOMY  1995   vaginal    Prior to Admission medications   Medication Sig Start Date End Date Taking? Authorizing Provider  albuterol (PROVENTIL HFA;VENTOLIN HFA) 108 (90 Base) MCG/ACT inhaler Inhale 2 puffs  into the lungs every 6 (six) hours as needed for wheezing or shortness of breath. 06/11/16   Birdie Sons, MD  albuterol (PROVENTIL) (2.5 MG/3ML) 0.083% nebulizer solution Take 3 mLs (2.5 mg total) by nebulization every 4 (four) hours as needed for wheezing or shortness of breath. Reported on 03/19/2015 03/19/15   Theodoro Grist, MD  ALPRAZolam Duanne Moron) 1 MG tablet TAKE 1 TABLET BY MOUTH EVERY 8 HOURS AS NEEDED 03/03/17   Birdie Sons, MD  ARIPiprazole (ABILIFY) 5 MG tablet Take 5 mg by mouth daily. Reported on 03/19/2015    [provider]  benzonatate (TESSALON PERLES) 100 MG capsule Take 1 capsule (100 mg total) every 6 (six) hours as needed by mouth for cough. 01/13/17 01/13/18  Nance Pear, MD  clonazePAM (KLONOPIN) 1 MG tablet Take 1 tablet (1 mg total) by mouth 2 (two) times daily as needed. 02/14/17   Birdie Sons, MD  dexlansoprazole (DEXILANT) 60 MG capsule Take 1 capsule (60 mg total) by mouth daily. 07/24/16   Lucilla Lame, MD  diphenhydrAMINE (BENADRYL ALLERGY) 25 MG tablet Take 1 tablet (25 mg total) by mouth every 6 (six) hours as needed for up to 5 days. 05/17/17 05/22/17  Lannie Fields, PA-C  famotidine (PEPCID) 20 MG tablet Take 1 tablet (20 mg total) by mouth 2 (two) times daily for 7 days. 05/17/17 05/24/17  Lannie Fields, PA-C  hydroxychloroquine (PLAQUENIL) 200 MG tablet Take 2 tablets (400 mg total) by mouth daily. 02/14/17   Birdie Sons, MD  metoprolol tartrate (LOPRESSOR) 25 MG tablet Take 1 tablet (25 mg total) by mouth 2 (two) times daily. 03/27/17 06/25/17  Wellington Hampshire, MD  montelukast (SINGULAIR) 10 MG tablet Take 1 tablet (10 mg total) by mouth at bedtime. Start taking ONE day after finishing prednisone 06/11/16   Birdie Sons, MD  nitroGLYCERIN (NITROSTAT) 0.4 MG SL tablet Place 1 tablet (0.4 mg total) under the tongue every 5 (five) minutes as needed for chest pain. 12/08/14   Wellington Hampshire, MD  predniSONE (STERAPRED UNI-PAK 21 TAB) 10  MG (21) TBPK tablet Take 6 tabs the the 1st day. Take 6 tabs the the 2nd day. Take 5 tabs the the 3rd day. Take 5 tabs the 4th day. Take 4 tabs the the 5th day.Take 4 tabs the the 6th day.Take 3 tabs the 7th day.Take 3 tabs the 8th day. Take 2 tabs the 9th day. Take 2 tabs the 10th day. Take 1 tab the 11th day. Take 1 tab the 12th day. 05/17/17   Lannie Fields, PA-C  Respiratory Therapy Supplies (FLUTTER) DEVI 1 Device by Does not apply route daily. 03/19/15   Theodoro Grist, MD  sucralfate (CARAFATE) 1 g tablet TAKE 1 TABLET (1 G TOTAL) BY MOUTH 4 (FOUR) TIMES DAILY - WITH MEALS AND AT BEDTIME. 11/11/16   Birdie Sons, MD  umeclidinium-vilanterol (ANORO ELLIPTA) 62.5-25 MCG/INH AEPB Inhale 1 puff into the lungs daily. 07/04/16   Laverle Hobby, MD  propranolol (INDERAL) 20 MG tablet TAKE 1 TABLET BY MOUTH 3 TIMES A DAY 05/31/15 06/19/15  Birdie Sons, MD    Allergies Alum & mag hydroxide-simeth; Cefdinir; Chantix [varenicline]; Doxycycline; and Ivp dye [iodinated diagnostic agents]  Family  History  Problem Relation Age of Onset  . Emphysema Father   . Asthma Father   . Heart disease Father   . Heart attack Father   . Arthritis Father        RA  . Colon cancer Father   . Hypertension Mother   . Breast cancer Neg Hx     Social History Social History   Tobacco Use  . Smoking status: Current Every Day Smoker    Packs/day: 0.50    Years: 16.00    Pack years: 8.00    Types: Cigarettes  . Smokeless tobacco: Never Used  . Tobacco comment: started smoking 52yo up to 1 ppd  Substance Use Topics  . Alcohol use: No    Alcohol/week: 0.0 oz  . Drug use: No     Review of Systems  Constitutional: No fever/chills Eyes: No visual changes. No discharge ENT: No upper respiratory complaints. Cardiovascular: no chest pain. Respiratory: no cough. No SOB. Gastrointestinal: No abdominal pain.  No nausea, no vomiting.  No diarrhea.  No constipation. Musculoskeletal: Negative for  musculoskeletal pain. Skin: Patient has rash.  Neurological: Negative for headaches, focal weakness or numbness.   ____________________________________________   PHYSICAL EXAM:  VITAL SIGNS: ED Triage Vitals  Enc Vitals Group     BP 05/17/17 1441 119/65     Pulse Rate 05/17/17 1441 84     Resp 05/17/17 1441 18     Temp 05/17/17 1441 (!) 97.5 F (36.4 C)     Temp Source 05/17/17 1441 Oral     SpO2 05/17/17 1441 97 %     Weight 05/17/17 1442 155 lb (70.3 kg)     Height 05/17/17 1442 5\' 3"  (1.6 m)     Head Circumference --      Peak Flow --      Pain Score --      Pain Loc --      Pain Edu? --      Excl. in [place]? --      Constitutional: Alert and oriented. Well appearing and in no acute distress. Eyes: Conjunctivae are normal. PERRL. EOMI. Head: Atraumatic. ENT:      Ears: TMs are pearly.      Nose: No congestion/rhinnorhea.      Mouth/Throat: Mucous membranes are moist.  Hematological/Lymphatic/Immunilogical: No cervical lymphadenopathy. Cardiovascular: Normal rate, regular rhythm. Normal S1 and S2.  Good peripheral circulation. Respiratory: Normal respiratory effort without tachypnea or retractions. Lungs CTAB. Good air entry to the bases with no decreased or absent breath sounds. Gastrointestinal: Bowel sounds 4 quadrants. Soft and nontender to palpation. No guarding or rigidity. No palpable masses. No distention. No CVA tenderness. Musculoskeletal: Full range of motion to all extremities. No gross deformities appreciated. Neurologic:  Normal speech and language. No gross focal neurologic deficits are appreciated.  Skin: Patient has diffuse, linear, macular rash of face, upper extremities and lower extremities. Psychiatric: Mood and affect are normal. Speech and behavior are normal. Patient exhibits appropriate insight and judgement.   ____________________________________________   LABS (all labs ordered are listed, but only abnormal results are displayed)  Labs  Reviewed - No data to display ____________________________________________  EKG   ____________________________________________  RADIOLOGY  No results found.  ____________________________________________    PROCEDURES  Procedure(s) performed:    Procedures    Medications  methylPREDNISolone sodium succinate (SOLU-MEDROL) 125 mg/2 mL injection 125 mg (not administered)  diphenhydrAMINE (BENADRYL) injection 25 mg (not administered)  famotidine (PEPCID) tablet 20 mg (not administered)  ____________________________________________   INITIAL IMPRESSION / ASSESSMENT AND PLAN / ED COURSE  Pertinent labs & imaging results that were available during my care of the patient were reviewed by me and considered in my medical decision making (see chart for details).  Review of the [place] CSRS was performed in accordance of the [place] prior to dispensing any controlled drugs.     Assessment and plan Poison oak dermatitis Patient presents to the emergency department with diffuse, linear rash of face, upper extremities and lower extremities after coming in contact with poison ivy.  Patient was treated empirically with Solu-Medrol and discharged with tapered prednisone.  Patient was advised to return to the emergency department for new or worsening symptoms.  All patient questions were answered.   ____________________________________________  FINAL CLINICAL IMPRESSION(S) / ED DIAGNOSES  Final diagnoses:  Poison ivy dermatitis      NEW MEDICATIONS STARTED DURING THIS VISIT:  ED Discharge Orders        Ordered    diphenhydrAMINE (BENADRYL ALLERGY) 25 MG tablet  Every 6 hours PRN     05/17/17 1656    famotidine (PEPCID) 20 MG tablet  2 times daily     05/17/17 1656    predniSONE (STERAPRED UNI-PAK 21 TAB) 10 MG (21) TBPK tablet     05/17/17 1656          This chart was dictated using voice recognition software/Dragon. Despite best efforts to proofread, errors can  occur which can change the meaning. Any change was purely unintentional.    Lannie Fields, PA-C 05/17/17 1943    Earleen Newport, MD 05/18/17 0700

## 2017-05-17 NOTE — ED Notes (Signed)
No swelling to eyes or throat.

## 2017-05-19 ENCOUNTER — Telehealth: Payer: Self-pay | Admitting: Cardiovascular Disease

## 2017-05-19 DIAGNOSIS — R002 Palpitations: Secondary | ICD-10-CM

## 2017-05-19 NOTE — Telephone Encounter (Signed)
She can take an extra half tablet of metoprolol.  If symptoms do not improve, she should be added to the schedule.

## 2017-05-19 NOTE — Telephone Encounter (Signed)
I spoke with the patient. She is aware of Dr. Tyrell Antonio recommendations that he was agreeable with an extra 1/2 of metoprolol tartrate (12.5 mg). Per the patient, she states this has not helped her fluttering at all.  I advised that we would need to work her in on the schedule & she is agreeable. Reviewed with Dr. Fletcher Anon as to how soon she would need to be worked in. He advised that she go to the pharmacy and get her HR/ BP checked and call us back with the readings.  She is advised of the above and agreeable. She will call back in the AM with the readings.  She inquired if she should start the prednisone dose pack. I advised her to call with her HR/ BP readings in the AM and we can advise from there. She is agreeable.

## 2017-05-19 NOTE — Telephone Encounter (Signed)
Pt c/o medication issue:  1. Name of Medication: Metoprolol   2. How are you currently taking this medication (dosage and times per day)? 25 mg once in morning and evening   3. Are you having a reaction (difficulty breathing--STAT)? No   4. What is your medication issue? She states the past two days, her heart feels like it is jumping out of beat. She also states her fingers are a bit numb  she went to Hospital on saturday for [facility], they did give her a shot of Steroid  She states this started then, not sure if it was the shot or the medication   Please call back

## 2017-05-19 NOTE — Telephone Encounter (Signed)
I spoke with the patient. She states she did go to the ER Saturday for poison ivy and received a steroid injection.  Prior to that, she would have intermittent palpitations.  She states these are worse over the last 2 days (more constant in nature).  She has not checked her HR/ BP. I advised her that the steroid may have aggravated what she is feeling. She is currently on metoprolol tartrate 25 mg BID for PVC's & she has a history of runs of SVT. Metoprolol was increased in January 2019 for episodes of her heart racing (initally on metoprolol tartrate 12.5 mg BID). The patient is concerned with her heart fluttering right now as she has been prescribed a prednisone taper for 10 days. She has not started this yet and doesn't feel like the steroid injection made any difference in her poison ivy. I have advised her to go ahead and try an extra 12.5 mg of metoprolol now to see if this will help the fluttering she is feeling.  She also complains of tingling/ numbness to her fingertips that has been going on for a few weeks- fingertips do turn white.  I advised her I will need to review the above with Dr. Fletcher Anon and call her back with further recommendations. She is agreeable.

## 2017-05-20 NOTE — Telephone Encounter (Signed)
No answer. Left message to call back.   

## 2017-05-20 NOTE — Telephone Encounter (Signed)
S/w patient. BP yesterday evening were 94/89, HR 83 and took it a few moments later and it was 102/59, HR 83.  She is still feeling the constant fluttering. It wakes her up at night. It used to be tolerable but is not more frequent and happens continually throughout the day. After taking the extra 1/2 dose of metoprolol yesterday afternoon, she did feel dizzy. She has not started her prednisone dose taper from her [place] visit to the ER yet. Complains her face is still swollen from the poison ivy.  Advised patient I will route to Dr Fletcher Anon for advice and if we can add her to his schedule today.

## 2017-05-20 NOTE — Telephone Encounter (Signed)
Yesterday readings   TIME   BP  HR 5 PM  94/89  83   repeat 102/59  83   Please call to advise

## 2017-05-20 NOTE — Telephone Encounter (Signed)
S/w Dr Fletcher Anon concerning patient. He advised ok for patient to start the prednisone she was given from the ED and we could order a 48 hour holter monitor for the fluttering. Patient was agreeable.  Transferred to scheduler to schedule the holter appointment.

## 2017-05-22 ENCOUNTER — Ambulatory Visit (INDEPENDENT_AMBULATORY_CARE_PROVIDER_SITE_OTHER): Payer: Medicare Other

## 2017-05-22 DIAGNOSIS — R002 Palpitations: Secondary | ICD-10-CM

## 2017-05-30 ENCOUNTER — Emergency Department
Admission: EM | Admit: 2017-05-30 | Discharge: 2017-05-30 | Disposition: A | Discharge status: Home / Self Care | Payer: Medicare Other | Attending: Emergency Medicine | Admitting: Emergency Medicine

## 2017-05-30 ENCOUNTER — Encounter: Payer: Self-pay | Admitting: Emergency Medicine

## 2017-05-30 ENCOUNTER — Ambulatory Visit
Admission: RE | Admit: 2017-05-30 | Discharge: 2017-05-30 | Disposition: A | Discharge status: Home / Self Care | Payer: Medicare Other | Source: Ambulatory Visit | Attending: Cardiovascular Disease | Admitting: Cardiovascular Disease

## 2017-05-30 ENCOUNTER — Emergency Department: Payer: Medicare Other

## 2017-05-30 ENCOUNTER — Ambulatory Visit
Admission: RE | Admit: 2017-05-30 | Discharge: 2017-05-30 | Disposition: A | Discharge status: Home / Self Care | Payer: Medicare Other | Source: Ambulatory Visit | Attending: Family Medicine | Admitting: Family Medicine

## 2017-05-30 ENCOUNTER — Ambulatory Visit (INDEPENDENT_AMBULATORY_CARE_PROVIDER_SITE_OTHER): Payer: Medicare Other | Admitting: Family Medicine

## 2017-05-30 ENCOUNTER — Other Ambulatory Visit: Payer: Self-pay

## 2017-05-30 ENCOUNTER — Telehealth: Payer: Self-pay | Admitting: Cardiovascular Disease

## 2017-05-30 ENCOUNTER — Encounter: Payer: Self-pay | Admitting: Family Medicine

## 2017-05-30 VITALS — BP 120/62 | Temp 97.9°F | Resp 16 | Wt 161.0 lb

## 2017-05-30 DIAGNOSIS — L930 Discoid lupus erythematosus: Secondary | ICD-10-CM | POA: Diagnosis not present

## 2017-05-30 DIAGNOSIS — L040 Acute lymphadenitis of face, head and neck: Secondary | ICD-10-CM | POA: Diagnosis not present

## 2017-05-30 DIAGNOSIS — R591 Generalized enlarged lymph nodes: Secondary | ICD-10-CM | POA: Diagnosis not present

## 2017-05-30 DIAGNOSIS — R05 Cough: Secondary | ICD-10-CM

## 2017-05-30 DIAGNOSIS — F17210 Nicotine dependence, cigarettes, uncomplicated: Secondary | ICD-10-CM | POA: Diagnosis not present

## 2017-05-30 DIAGNOSIS — R0989 Other specified symptoms and signs involving the circulatory and respiratory systems: Secondary | ICD-10-CM

## 2017-05-30 DIAGNOSIS — R059 Cough, unspecified: Secondary | ICD-10-CM

## 2017-05-30 DIAGNOSIS — R0602 Shortness of breath: Secondary | ICD-10-CM | POA: Diagnosis not present

## 2017-05-30 DIAGNOSIS — J449 Chronic obstructive pulmonary disease, unspecified: Secondary | ICD-10-CM | POA: Insufficient documentation

## 2017-05-30 DIAGNOSIS — I10 Essential (primary) hypertension: Secondary | ICD-10-CM | POA: Diagnosis not present

## 2017-05-30 DIAGNOSIS — I2510 Atherosclerotic heart disease of native coronary artery without angina pectoris: Secondary | ICD-10-CM | POA: Insufficient documentation

## 2017-05-30 DIAGNOSIS — R002 Palpitations: Secondary | ICD-10-CM

## 2017-05-30 DIAGNOSIS — L239 Allergic contact dermatitis, unspecified cause: Secondary | ICD-10-CM | POA: Diagnosis not present

## 2017-05-30 DIAGNOSIS — B370 Candidal stomatitis: Secondary | ICD-10-CM | POA: Diagnosis not present

## 2017-05-30 DIAGNOSIS — I700 Atherosclerosis of aorta: Secondary | ICD-10-CM

## 2017-05-30 LAB — BASIC METABOLIC PANEL
Anion gap: 8 (ref 5–15)
BUN: 16 mg/dL (ref 6–20)
CHLORIDE: 104 mmol/L (ref 101–111)
CO2: 28 mmol/L (ref 22–32)
Calcium: 9 mg/dL (ref 8.9–10.3)
Creatinine, Ser: 0.96 mg/dL (ref 0.44–1.00)
GFR calc non Af Amer: >60 mL/min (ref >60)
Glucose, Bld: 101 mg/dL — ABNORMAL HIGH (ref 65–99)
POTASSIUM: 3.6 mmol/L (ref 3.5–5.1)
SODIUM: 140 mmol/L (ref 135–145)

## 2017-05-30 LAB — CBC
HCT: 41.9 % (ref 35.0–47.0)
HEMOGLOBIN: 14.1 g/dL (ref 12.0–16.0)
MCH: 27.1 pg (ref 26.0–34.0)
MCHC: 33.5 g/dL (ref 32.0–36.0)
MCV: 80.8 fL (ref 80.0–100.0)
Platelets: 234 10*3/uL (ref 150–440)
RBC: 5.19 MIL/uL (ref 3.80–5.20)
RDW: 15.5 % — ABNORMAL HIGH (ref 11.5–14.5)
WBC: 11.4 10*3/uL — ABNORMAL HIGH (ref 3.6–11.0)

## 2017-05-30 MED ORDER — LEVOFLOXACIN 750 MG PO TABS: 750.0000 mg | ORAL_TABLET | Freq: Every day | ORAL | 0 refills | Status: AC

## 2017-05-30 MED ORDER — NYSTATIN 100000 UNIT/ML MT SUSP: 5.0000 mL | Freq: Four times a day (QID) | OROMUCOSAL | 0 refills | Status: AC

## 2017-05-30 NOTE — Progress Notes (Signed)
Patient: Andrea [name] Female    DOB: 01-05-1966   52 y.o.   MRN: [id number] Visit Date: 05/30/2017  Today's Provider: Lelon Huh, MD   Chief Complaint  Patient presents with  . Sinusitis   Subjective:    Sinusitis  This is a new problem. The current episode started in the past 7 days (about 3 days). The problem has been gradually worsening since onset. There has been no fever. Associated symptoms include congestion, coughing, headaches, sinus pressure and swollen glands. Pertinent negatives include no chills or shortness of breath. Past treatments include oral decongestants. The treatment provided mild relief.  Rash  This is a new problem. The current episode started 1 to 4 weeks ago (2 weeks). The problem is unchanged. The affected locations include the left arm and right arm. The rash is characterized by burning, redness, dryness and itchiness. She was exposed to plant contact. Associated symptoms include congestion and coughing. Pertinent negatives include no fatigue, fever, shortness of breath or vomiting. Past treatments include oral steroids. The treatment provided mild relief.  Was in ER 05/17/17 and given steroid injection. Started on prednisone 3-4 days later and finished yesterday. Had been exposed to burning poison ive a few days before ER visit. She also has a history of discoid lupus previously on Plaquenil but she states this was discontinued when she was started on metoprolol and that she had several episodes of butterfly rashes since.   She also states she felt short of breath overnight and started to develop a cough. She feels slightly short of breath today     Allergies  Allergen Reactions  . Alum & Mag Hydroxide-Simeth Diarrhea and Nausea Only  . Cefdinir Other (See Comments)    tachycardia  . Chantix [Varenicline]     Bad dreams  . Doxycycline Other (See Comments)    Nausea, migraine  . Ivp Dye [Iodinated Diagnostic Agents] Palpitations     Current  Outpatient Medications:  .  albuterol (PROVENTIL HFA;VENTOLIN HFA) 108 (90 Base) MCG/ACT inhaler, Inhale 2 puffs into the lungs every 6 (six) hours as needed for wheezing or shortness of breath., Disp: 1 Inhaler, Rfl: 3 .  albuterol (PROVENTIL) (2.5 MG/3ML) 0.083% nebulizer solution, Take 3 mLs (2.5 mg total) by nebulization every 4 (four) hours as needed for wheezing or shortness of breath. Reported on 03/19/2015, Disp: 75 mL, Rfl: 12 .  ALPRAZolam (XANAX) 1 MG tablet, TAKE 1 TABLET BY MOUTH EVERY 8 HOURS AS NEEDED, Disp: 60 tablet, Rfl: 3 .  ARIPiprazole (ABILIFY) 5 MG tablet, Take 5 mg by mouth daily. Reported on 03/19/2015, Disp: , Rfl:  .  clonazePAM (KLONOPIN) 1 MG tablet, Take 1 tablet (1 mg total) by mouth 2 (two) times daily as needed., Disp: 90 tablet, Rfl: 3 .  dexlansoprazole (DEXILANT) 60 MG capsule, Take 1 capsule (60 mg total) by mouth daily., Disp: 30 capsule, Rfl: 6 .  metoprolol tartrate (LOPRESSOR) 25 MG tablet, Take 1 tablet (25 mg total) by mouth 2 (two) times daily., Disp: 180 tablet, Rfl: 3 .  montelukast (SINGULAIR) 10 MG tablet, Take 1 tablet (10 mg total) by mouth at bedtime. Start taking ONE day after finishing prednisone, Disp: 30 tablet, Rfl: 3 .  nitroGLYCERIN (NITROSTAT) 0.4 MG SL tablet, Place 1 tablet (0.4 mg total) under the tongue every 5 (five) minutes as needed for chest pain., Disp: 25 tablet, Rfl: 0 .  Respiratory Therapy Supplies (FLUTTER) DEVI, 1 Device by Does not apply route  daily., Disp: 1 each, Rfl: 0 .  sucralfate (CARAFATE) 1 g tablet, TAKE 1 TABLET (1 G TOTAL) BY MOUTH 4 (FOUR) TIMES DAILY - WITH MEALS AND AT BEDTIME., Disp: 28 tablet, Rfl: 11 .  umeclidinium-vilanterol (ANORO ELLIPTA) 62.5-25 MCG/INH AEPB, Inhale 1 puff into the lungs daily., Disp: 60 each, Rfl: 5 .  diphenhydrAMINE (BENADRYL ALLERGY) 25 MG tablet, Take 1 tablet (25 mg total) by mouth every 6 (six) hours as needed for up to 5 days., Disp: 30 tablet, Rfl: 0 .  famotidine (PEPCID) 20 MG  tablet, Take 1 tablet (20 mg total) by mouth 2 (two) times daily for 7 days., Disp: 60 tablet, Rfl: 1 .  predniSONE (STERAPRED UNI-PAK 21 TAB) 10 MG (21) TBPK tablet, Take 6 tabs the the 1st day. Take 6 tabs the the 2nd day. Take 5 tabs the the 3rd day. Take 5 tabs the 4th day. Take 4 tabs the the 5th day.Take 4 tabs the the 6th day.Take 3 tabs the 7th day.Take 3 tabs the 8th day. Take 2 tabs the 9th day. Take 2 tabs the 10th day. Take 1 tab the 11th day. Take 1 tab the 12th day. (Patient not taking: Reported on 05/30/2017), Disp: 45 tablet, Rfl: 0  Review of Systems  Constitutional: Negative for appetite change, chills, fatigue and fever.  HENT: Positive for congestion and sinus pressure.   Respiratory: Positive for cough. Negative for chest tightness and shortness of breath.   Cardiovascular: Negative for chest pain and palpitations.  Gastrointestinal: Negative for abdominal pain, nausea and vomiting.  Skin: Positive for rash.  Neurological: Positive for headaches. Negative for dizziness and weakness.    Social History   Tobacco Use  . Smoking status: Current Every Day Smoker    Packs/day: 0.50    Years: 16.00    Pack years: 8.00    Types: Cigarettes  . Smokeless tobacco: Never Used  . Tobacco comment: started smoking 52yo up to 1 ppd  Substance Use Topics  . Alcohol use: No    Alcohol/week: 0.0 oz   Objective:   BP 120/62 (BP Location: Left Arm, Patient Position: Sitting, Cuff Size: Normal)   Temp 97.9 F (36.6 C)   Resp 16   Wt 161 lb (73 kg)   BMI 28.52 kg/m     Physical Exam   General Appearance:    Alert, cooperative, no distress  HENT:   bilateral TM normal without fluid or infection, neck has left submandibular  nodes enlarged, frontal sinus tender and nasal mucosa congested, thrush noted   Eyes:    PERRL, conjunctiva/corneas clear, EOM's intact       Lungs:     Faint LLL rales,  respirations unlabored  Heart:    Regular rate and rhythm  Neurologic:   Awake,  alert, oriented x 3. No apparent focal neurological           defect.   Skin:   Extensive plaque formation both forearms, right > left, no surrounding erythema. Do drainage.        Assessment & Plan:     1. Cough  - DG Chest 2 View; Future  2. Rales  - levofloxacin (LEVAQUIN) 750 MG tablet; Take 1 tablet (750 mg total) by mouth daily for 7 days.  Dispense: 7 tablet; Refill: 0  3. Shortness of breath Sent directed to [facility] for xray. Is to go ahead and start antibiotics and advised to start using inhalers on schedule. Go to ER if any worsening shortness  of breath, cough or fever.  - DG Chest 2 View; Future  4. Discoid lupus Currently off of plaquenil apparently due to potential interaction with metoprolol. She has not been followed by rheumatology for several years and needs to establish. Referral order entered.   5. Lymphadenopathy   6. Thrush  - nystatin (MYCOSTATIN) 100000 UNIT/ML suspension; Take 5 mLs (500,000 Units total) by mouth 4 (four) times daily for 5 days. Swish and swallow  Dispense: 100 mL; Refill: 0 - levofloxacin (LEVAQUIN) 750 MG tablet; Take 1 tablet (750 mg total) by mouth daily for 7 days.  Dispense: 7 tablet; Refill: 0  7. Allergic contact dermatitis, unspecified trigger Has completed prednisone taper. I'm not sure if forearm lesions are remnants of poison ivy exposure, or related to discoid lupus, but she has already had an extended course of corticosteroids.        Lelon Huh, MD  [facility]

## 2017-05-30 NOTE — ED Notes (Signed)
Upon going in to discharge patient, room found to be empty.  No sign of patient return.  Unable to go over discharge paper work or receive signature for this reason.  Will attempt to contact patient via telephone to go over discharge paperwork and answer any questions she may have.

## 2017-05-30 NOTE — ED Triage Notes (Signed)
Pt to ED via POV c/o shortness of breath that started last night. Pt has hx/o COPD, pt states that she was seen by her PCP today. Her PCP called medication into her pharmacy but she has not picked it up. Pt in NAD at this time.

## 2017-05-30 NOTE — ED Notes (Signed)
Voicemail left with patients mobile number on file; paper work placed in brown folder and left at front desk.

## 2017-05-30 NOTE — ED Provider Notes (Signed)
[facility] Emergency Department Provider Note  ____________________________________________  Time seen: Approximately 7:24 PM  I have reviewed the triage vital signs and the nursing notes.   HISTORY  Chief Complaint Shortness of Breath    HPI Andrea [name] is a 52 y.o. female with a history of COPD, chronic shortness of breath of unclear etiology, CAD and symptomatic PVCs presenting for shortness of breath.  The patient reports that for the past several years, she has had exertional shortness of breath.  She has no associated chest pain, lightheadedness or syncope, cough or cold symptoms, fever or chills.  She has had extensive recent workup including normal VQ scan without PE, pulmonary evaluation without any findings, and cardiology evaluation with initiation of metoprolol 1 month ago without improvement in symptoms.  The patient is here tonight because she states that her sob used to be episodic but has been constant for the past week.  No other new sx's.  Past Medical History:  Diagnosis Date  . Anxiety    panic attacks, chest pain  . Arthritis   . COPD (chronic obstructive pulmonary disease) ([place])   . GERD (gastroesophageal reflux disease)   . Hypertension    not medicated  . Non-obstructive CAD in native artery    a. cardiac cath 01/2014: showed minor irregularities, mildly elevated left ventricular end-diastolic pressure and normal ejection fraction; b. 03/2015 MV: low risk.  . Palpitations    a. 08/2016 Event Monitor: occas PVC's, two brief runs of SVT (4 and 7 beats).  Marland Kitchen PONV (postoperative nausea and vomiting)   . Symptomatic PVC's (premature ventricular contractions)   . Tobacco abuse   . Wears dentures    partial upper and lower    Patient Active Problem List   Diagnosis Date Noted  . Varicose veins of bilateral lower extremities with pain 11/22/2016  . Restless leg 11/18/2016  . Iron deficiency 11/18/2016  . Allergic rhinitis 06/11/2016   . Hyperlipidemia 03/19/2015  . COPD exacerbation ([place]) 10/04/2014  . Anxiety 09/22/2014  . Depression 09/22/2014  . Dyshidrosis 09/22/2014  . GERD (gastroesophageal reflux disease) 09/22/2014  . Insomnia 09/22/2014  . Lung nodule, multiple 09/22/2014  . Panic disorder 09/22/2014  . Palpitations 01/17/2014  . Pulmonary nodule 06/10/2013  . Shortness of breath 05/20/2013  . COPD, GOLD B 05/20/2013  . Tobacco abuse 05/20/2013  . Daytime somnolence 05/20/2013  . Back pain 08/28/2012  . Discoid lupus 05/30/2012  . Cellulitis and abscess of face 05/30/2012  . Generalized abdominal pain 03/11/2012  . Pain in joint 03/09/2012  . Psoriasis 02/21/2012  . Fibromyalgia 02/11/2012  . Lupus 02/11/2012  . Panic attacks 02/11/2012  . History of adenomatous polyp of colon 09/13/2011  . Heartburn 08/06/2011  . PVC's (premature ventricular contractions) 01/29/2011  . Essential (primary) hypertension 03/04/1998    Past Surgical History:  Procedure Laterality Date  . CARDIAC CATHETERIZATION  01/03/14  . CARDIAC CATHETERIZATION    . ESOPHAGOGASTRODUODENOSCOPY (EGD) WITH PROPOFOL N/A 07/11/2016   Procedure: ESOPHAGOGASTRODUODENOSCOPY (EGD) WITH PROPOFOL;  Surgeon: Lucilla Lame, MD;  Location: [place];  Service: Endoscopy;  Laterality: N/A;  . EYE SURGERY    . IMAGE GUIDED SINUS SURGERY N/A 11/30/2014   Procedure: IMAGE GUIDED SINUS SURGERY;  Surgeon: Carloyn Manner, MD;  Location: [place];  Service: ENT;  Laterality: N/A;  GAVE DISK TO CE CE  . MAXILLARY ANTROSTOMY Bilateral 11/30/2014   Procedure: MAXILLARY ANTROSTOMY;  Surgeon: Carloyn Manner, MD;  Location: [place];  Service: ENT;  Laterality: Bilateral;  . SEPTOPLASTY N/A 11/30/2014   Procedure: SEPTOPLASTY;  Surgeon: Carloyn Manner, MD;  Location: [place];  Service: ENT;  Laterality: N/A;  . SPHENOIDECTOMY Left 11/30/2014   Procedure: Coralee Pesa, ;  Surgeon: Carloyn Manner, MD;   Location: [place];  Service: ENT;  Laterality: Left;  Marland Kitchen VAGINAL HYSTERECTOMY  1995   vaginal    Current Outpatient Rx  . Order #: [id number] Class: Normal  . Order #: [id number] Class: Normal  . Order #: [id number] Class: Normal  . Order #: [id number] Class: Historical Med  . Order #: [id number] Class: Normal  . Order #: [id number] Class: Normal  . Order #: [id number] Class: Print  . Order #: [id number] Class: Print  . Order #: [id number] Class: Normal  . Order #: [id number] Class: Normal  . Order #: [id number] Class: Normal  . Order #: [id number] Class: Normal  . Order #: [id number] Class: Normal  . Order #: [id number] Class: Print  . Order #: [id number] Class: Normal  . Order #: [id number] Class: Normal  . Order #: [id number] Class: Normal    Allergies Alum & mag hydroxide-simeth; Cefdinir; Chantix [varenicline]; Doxycycline; and Ivp dye [iodinated diagnostic agents]  Family History  Problem Relation Age of Onset  . Emphysema Father   . Asthma Father   . Heart disease Father   . Heart attack Father   . Arthritis Father        RA  . Colon cancer Father   . Hypertension Mother   . Breast cancer Neg Hx     Social History Social History   Tobacco Use  . Smoking status: Current Every Day Smoker    Packs/day: 0.50    Years: 16.00    Pack years: 8.00    Types: Cigarettes  . Smokeless tobacco: Never Used  . Tobacco comment: started smoking 52yo up to 1 ppd  Substance Use Topics  . Alcohol use: No    Alcohol/week: 0.0 oz  . Drug use: No    Review of Systems Constitutional: No fever/chills.  No lightheadedness or syncope. Eyes: No visual changes. ENT: No sore throat. No congestion or rhinorrhea. Cardiovascular: Denies chest pain. Denies palpitations. Respiratory: Positive shortness of breath.  No cough. Gastrointestinal: No abdominal pain.  No nausea, no vomiting.  No diarrhea.  No constipation. Genitourinary: Negative for dysuria. Musculoskeletal: Negative for back pain.  No  lower extremity swelling or calf pain. Skin: Negative for rash. Neurological: Negative for headaches. No focal numbness, tingling or weakness.     ____________________________________________   PHYSICAL EXAM:  VITAL SIGNS: ED Triage Vitals  Enc Vitals Group     BP 05/30/17 1815 (!) 142/74     Pulse Rate 05/30/17 1815 88     Resp 05/30/17 1815 16     Temp 05/30/17 1815 98.2 F (36.8 C)     Temp Source 05/30/17 1815 Oral     SpO2 05/30/17 1815 96 %     Weight 05/30/17 1816 161 lb (73 kg)     Height 05/30/17 1816 5\' 3"  (1.6 m)     Head Circumference --      Peak Flow --      Pain Score 05/30/17 1816 0     Pain Loc --      Pain Edu? --      Excl. in [place]? --     Constitutional: Alert and oriented. Well appearing and in no acute distress. Answers questions appropriately. Eyes: Conjunctivae are normal.  EOMI. No scleral icterus. Head: Atraumatic. Nose: No congestion/rhinnorhea. Mouth/Throat: Mucous membranes are moist.  Neck: No stridor.  Supple.  No JVD.  No meningismus.  Left-sided submandibular lymphadenopathy. Cardiovascular: Normal rate, regular rhythm. No murmurs, rubs or gallops.  Respiratory: Normal respiratory effort.  No accessory muscle use or retractions. Lungs CTAB.  No wheezes, rales or ronchi. Gastrointestinal: Soft, nontender and nondistended.  No guarding or rebound.  No peritoneal signs. Musculoskeletal: No LE edema. No ttp in the calves or palpable cords.  Negative Homan's sign. Neurologic:  A&Ox3.  Speech is clear.  Face and smile are symmetric.  EOMI.  Moves all extremities well. Skin:  Skin is warm, dry and intact. No rash noted. Psychiatric: Mood and affect are normal. Speech and behavior are normal.  Normal judgement  ____________________________________________   LABS (all labs ordered are listed, but only abnormal results are displayed)  Labs Reviewed  CBC  BASIC METABOLIC PANEL   ____________________________________________  EKG  ED ECG  REPORT I, Eula Listen, the attending physician, personally viewed and interpreted this ECG.   Date: 05/30/2017  EKG Time: 1821  Rate: 93  Rhythm: normal sinus rhythm with recurrent PVCs  Axis: normal  Intervals:none  ST&T Change: No STEMI  ____________________________________________  RADIOLOGY  Dg Chest 2 View  Result Date: 05/30/2017 CLINICAL DATA:  Shortness of breath. EXAM: CHEST - 2 VIEW COMPARISON:  Radiographs of May 30, 2017. FINDINGS: The heart size and mediastinal contours are within normal limits. Both lungs are clear. No pneumothorax or pleural effusion is noted. Atherosclerosis of thoracic aorta is noted. The visualized skeletal structures are unremarkable. IMPRESSION: No active cardiopulmonary disease. Aortic Atherosclerosis (ICD10-I70.0). Electronically Signed   By: Marijo Conception, M.D.   On: 05/30/2017 18:47    ____________________________________________   PROCEDURES  Procedure(s) performed: None  Procedures  Critical Care performed: No ____________________________________________   INITIAL IMPRESSION / ASSESSMENT AND PLAN / ED COURSE  Pertinent labs & imaging results that were available during my care of the patient were reviewed by me and considered in my medical decision making (see chart for details).  52 y.o. female with a history of COPD, CAD, symptomatic PVCs, and chronic shortness of breath of unknown etiology presenting with ongoing shortness of breath.  Today, the patient is hemodynamically stable and has had O2 sats of greater than 99% on room air for the entirety of her stay.  She has a completely normal cardiopulmonary examination and a chest x-ray which does not show any acute process.  Her EKG does not show ischemic changes.  She does have PVCs on her EKG which is consistent with her prior diagnosis of PVCs.  The patient has no signs or symptoms that would be concerning for PE; she is not tachycardic, hypoxic, and has no evidence of  DVT and she has had imaging in the past that has been negative for the same symptoms.  Today, the patient has no new symptoms, and I have encouraged her to follow-up with her primary care physician, her cardiologist and her pulmonologist for continued evaluation.  ____________________________________________  FINAL CLINICAL IMPRESSION(S) / ED DIAGNOSES  Final diagnoses:  SOB (shortness of breath)  Lymphadenopathy of head and neck         NEW MEDICATIONS STARTED DURING THIS VISIT:  New Prescriptions   No medications on file      Eula Listen, MD 05/30/17 2023

## 2017-05-30 NOTE — Discharge Instructions (Signed)
Please return to the emergency department for severe pain, shortness of breath, fever, lightheadedness or fainting, or for any other symptoms concerning to you.

## 2017-05-30 NOTE — Telephone Encounter (Signed)
Spoke with patient and advised that monitor results are still pending at this time. Advised that nurse would call with results once available. She verbalized understanding with no further questions at this time.

## 2017-05-30 NOTE — Telephone Encounter (Signed)
Patient calling to check on results of holter monitor.  Please call.

## 2017-06-03 ENCOUNTER — Telehealth: Payer: Self-pay | Admitting: Cardiovascular Disease

## 2017-06-03 NOTE — Telephone Encounter (Signed)
Patient calling to let us know she has not heard anything back on holter results and she is still having the sam symptoms please call to discuss

## 2017-06-04 NOTE — Telephone Encounter (Signed)
Notified patient that holter monitor is awaiting review from Dr. Fletcher Anon. She did go to the ED on March 29 for SOB where EKG showed PVCs. Patient was discharged home. She is currently taking metoprolol 25mg  BID and has decreased caffeine intake and smoking. She understands we will call her with results and a follow up plan once Dr. Fletcher Anon has read the monitor.

## 2017-06-05 ENCOUNTER — Telehealth: Payer: Self-pay

## 2017-06-05 ENCOUNTER — Ambulatory Visit: Payer: Medicaid Other

## 2017-06-05 NOTE — Telephone Encounter (Signed)
complete

## 2017-06-05 NOTE — Telephone Encounter (Signed)
Called pt to reschedule cancelled AWV today. Pt states she got a lot going on with other apts right now and she will check back with [place] in a month or so.  -MM

## 2017-06-12 NOTE — Telephone Encounter (Signed)
Routing to Dr Fletcher Anon to review.

## 2017-06-12 NOTE — Telephone Encounter (Signed)
Patient calling to check on status of results °Please call  °

## 2017-06-13 ENCOUNTER — Other Ambulatory Visit: Payer: Self-pay

## 2017-06-13 MED ORDER — DILTIAZEM HCL ER COATED BEADS 120 MG PO CP24: 120.0000 mg | ORAL_CAPSULE | Freq: Every day | ORAL | 5 refills | Status: DC

## 2017-06-13 NOTE — Telephone Encounter (Signed)
Patient was called this morning and is aware of results and recommendations.

## 2017-06-13 NOTE — Telephone Encounter (Signed)
See result note.  

## 2017-06-18 ENCOUNTER — Telehealth: Payer: Self-pay | Admitting: Cardiovascular Disease

## 2017-06-18 NOTE — Telephone Encounter (Signed)
Patient has a question on the medication diltiazem  She would like to know how long she should give it before it starts working - patient feels like it is not doing anything Please call to discuss

## 2017-06-18 NOTE — Telephone Encounter (Signed)
Let's switch back to Metoprolol 25 mg but make it 3 times daily. Refer to Dr. Caryl Comes.

## 2017-06-18 NOTE — Telephone Encounter (Signed)
Patient stopped metoprolol and started diltiazem 120 mg on Saturday 06/14/17. States it has not helped yet. Says she still feels "fluttering" constantly, on and off throughout the day. Says its so hard to bare. Last night, the fluttering woke her up 2 times. No other symptoms to report. She would like to know if there's anything else she can take to help with this. She has not checked her BP or HR recently. Advised I will route to Dr Fletcher Anon for advice.

## 2017-06-19 MED ORDER — METOPROLOL TARTRATE 25 MG PO TABS: 25.0000 mg | ORAL_TABLET | Freq: Three times a day (TID) | ORAL | 3 refills | Status: DC

## 2017-06-19 NOTE — Telephone Encounter (Signed)
S/w patient. She was agreeable to stop Diltiazem and resume Metoprolol 25 mg three time a day and to referral to Dr Caryl Comes. Patient scheduled to see Dr Caryl Comes on 07/03/17. She verbalized understanding of appointment date and time. Rx sent to verified pharmacy.

## 2017-07-03 ENCOUNTER — Encounter: Payer: Self-pay | Admitting: Internal Medicine

## 2017-07-03 ENCOUNTER — Ambulatory Visit (INDEPENDENT_AMBULATORY_CARE_PROVIDER_SITE_OTHER): Payer: Medicare Other | Admitting: Internal Medicine

## 2017-07-03 VITALS — BP 120/80 | HR 80 | Ht 63.0 in | Wt 160.0 lb

## 2017-07-03 DIAGNOSIS — R002 Palpitations: Secondary | ICD-10-CM | POA: Diagnosis not present

## 2017-07-03 DIAGNOSIS — I493 Ventricular premature depolarization: Secondary | ICD-10-CM | POA: Diagnosis not present

## 2017-07-03 MED ORDER — METOPROLOL TARTRATE 25 MG PO TABS: 25.0000 mg | ORAL_TABLET | Freq: Two times a day (BID) | ORAL | Status: DC

## 2017-07-03 MED ORDER — FLECAINIDE ACETATE 100 MG PO TABS: 100.0000 mg | ORAL_TABLET | Freq: Two times a day (BID) | ORAL | 6 refills | Status: DC

## 2017-07-03 NOTE — Progress Notes (Signed)
ELECTROPHYSIOLOGY CONSULT NOTE  Patient ID: Andrea [name], MRN: [id number], DOB/AGE: 1965-09-04 52 y.o. Admit date: (Not on file) Date of Consult: 07/03/2017  Primary Physician: Birdie Sons, MD Primary Cardiologist: MA     Andrea [name] is a 52 y.o. female who is being seen today for the evaluation of PVC at the request of MA.    HPI Andrea [name] is a 52 y.o. female seen for PVCs.    She has a history of symptomatic palpitations.  A 2-week monitor  2018 demonstrated a rare PVC and 2 nonsustained runs of SVT.  5/18 and 3/19 she was seen in the emergency room for palpitations.  She was noted to be in bigeminy with a right bundle branch block inferior axis morphology PVC ECGs 6/18, 9/18, 11/18 were all personally reviewed and demonstrated no ventricular ectopy  After the most recent ER visit, previously prescribed calcium blocker was discontinued with the initiation of metoprolol which she tolerated poorly.  Its use is been complicated by hypotension.  Moreover, as her pressure has helped a little.  Over the last couple of months her PVCs have become increasingly problematic and increasingly frequent.  They are now occurring daily.  They are associated with chest discomfort-tightness, shortness of breath, lightheadedness and presyncope as well as aggravating her propensity towards anxiety.  She notes no specific triggers apart from that with exercise they get worse..  She uses non-caffeinated coffee.     She has had extensive evaluations for coronary disease as noted below because of problems with chest pain and T wave inversions V1 and V2  She also has a history of COPD in the context of ongoing tobacco use  DATE TEST EF   11/15 LHC  No sign CAD  1/17 Myoview 68% No ischemia  7/18 Echo   60-65 %         Date Holter-PVCs  6/18 Cant find data   4/19 9%        Date Cr  K  3/19 0.96 3.6            Past Medical History:  Diagnosis Date  . Anxiety    panic  attacks, chest pain  . Arthritis   . COPD (chronic obstructive pulmonary disease) ([place])   . GERD (gastroesophageal reflux disease)   . Hypertension    not medicated  . Non-obstructive CAD in native artery    a. cardiac cath 01/2014: showed minor irregularities, mildly elevated left ventricular end-diastolic pressure and normal ejection fraction; b. 03/2015 MV: low risk.  . Palpitations    a. 08/2016 Event Monitor: occas PVC's, two brief runs of SVT (4 and 7 beats).  Marland Kitchen PONV (postoperative nausea and vomiting)   . Symptomatic PVC's (premature ventricular contractions)   . Tobacco abuse   . Wears dentures    partial upper and lower      Surgical History:  Past Surgical History:  Procedure Laterality Date  . CARDIAC CATHETERIZATION  01/03/14  . CARDIAC CATHETERIZATION    . ESOPHAGOGASTRODUODENOSCOPY (EGD) WITH PROPOFOL N/A 07/11/2016   Procedure: ESOPHAGOGASTRODUODENOSCOPY (EGD) WITH PROPOFOL;  Surgeon: Lucilla Lame, MD;  Location: [place];  Service: Endoscopy;  Laterality: N/A;  . EYE SURGERY    . IMAGE GUIDED SINUS SURGERY N/A 11/30/2014   Procedure: IMAGE GUIDED SINUS SURGERY;  Surgeon: Carloyn Manner, MD;  Location: [place];  Service: ENT;  Laterality: N/A;  GAVE DISK TO CE CE  . MAXILLARY ANTROSTOMY Bilateral  11/30/2014   Procedure: MAXILLARY ANTROSTOMY;  Surgeon: Carloyn Manner, MD;  Location: [place];  Service: ENT;  Laterality: Bilateral;  . SEPTOPLASTY N/A 11/30/2014   Procedure: SEPTOPLASTY;  Surgeon: Carloyn Manner, MD;  Location: [place];  Service: ENT;  Laterality: N/A;  . SPHENOIDECTOMY Left 11/30/2014   Procedure: Coralee Pesa, ;  Surgeon: Carloyn Manner, MD;  Location: [place];  Service: ENT;  Laterality: Left;  Marland Kitchen VAGINAL HYSTERECTOMY  1995   vaginal     Home Meds: Prior to Admission medications   Medication Sig Start Date End Date Taking? Authorizing Provider  albuterol (PROVENTIL HFA;VENTOLIN HFA) 108  (90 Base) MCG/ACT inhaler Inhale 2 puffs into the lungs every 6 (six) hours as needed for wheezing or shortness of breath. 06/11/16  Yes Birdie Sons, MD  albuterol (PROVENTIL) (2.5 MG/3ML) 0.083% nebulizer solution Take 3 mLs (2.5 mg total) by nebulization every 4 (four) hours as needed for wheezing or shortness of breath. Reported on 03/19/2015 03/19/15  Yes Theodoro Grist, MD  ALPRAZolam Duanne Moron) 1 MG tablet TAKE 1 TABLET BY MOUTH EVERY 8 HOURS AS NEEDED 03/03/17  Yes Birdie Sons, MD  ARIPiprazole (ABILIFY) 5 MG tablet Take 5 mg by mouth daily. Reported on 03/19/2015   Yes [provider]  clonazePAM (KLONOPIN) 1 MG tablet Take 1 tablet (1 mg total) by mouth 2 (two) times daily as needed. 02/14/17  Yes Birdie Sons, MD  dexlansoprazole (DEXILANT) 60 MG capsule Take 1 capsule (60 mg total) by mouth daily. 07/24/16  Yes Lucilla Lame, MD  metoprolol tartrate (LOPRESSOR) 25 MG tablet Take 1 tablet (25 mg total) by mouth 3 (three) times daily. 06/19/17 09/17/17 Yes Wellington Hampshire, MD  nitroGLYCERIN (NITROSTAT) 0.4 MG SL tablet Place 1 tablet (0.4 mg total) under the tongue every 5 (five) minutes as needed for chest pain. 12/08/14  Yes Wellington Hampshire, MD  Respiratory Therapy Supplies (FLUTTER) DEVI 1 Device by Does not apply route daily. 03/19/15  Yes Theodoro Grist, MD  sucralfate (CARAFATE) 1 g tablet TAKE 1 TABLET (1 G TOTAL) BY MOUTH 4 (FOUR) TIMES DAILY - WITH MEALS AND AT BEDTIME. 11/11/16  Yes Birdie Sons, MD  umeclidinium-vilanterol (ANORO ELLIPTA) 62.5-25 MCG/INH AEPB Inhale 1 puff into the lungs daily. 07/04/16  Yes Laverle Hobby, MD    Allergies:  Allergies  Allergen Reactions  . Alum & Mag Hydroxide-Simeth Diarrhea and Nausea Only  . Cefdinir Other (See Comments)    tachycardia  . Chantix [Varenicline]     Bad dreams  . Doxycycline Other (See Comments)    Nausea, migraine  . Ivp Dye [Iodinated Diagnostic Agents] Palpitations    Social History    Socioeconomic History  . Marital status: Married    Spouse name: Not on file  . Number of children: 3  . Years of education: Not on file  . Highest education level: Not on file  Occupational History  . Occupation: Disabled    Comment: Social Security as of 05/21/2011  Social Needs  . Financial resource strain: Not on file  . Food insecurity:    Worry: Not on file    Inability: Not on file  . Transportation needs:    Medical: Not on file    Non-medical: Not on file  Tobacco Use  . Smoking status: Current Every Day Smoker    Packs/day: 0.50    Years: 16.00    Pack years: 8.00    Types: Cigarettes  . Smokeless tobacco: Never  Used  . Tobacco comment: started smoking 52yo up to 1 ppd  Substance and Sexual Activity  . Alcohol use: No    Alcohol/week: 0.0 oz  . Drug use: No  . Sexual activity: Not on file  Lifestyle  . Physical activity:    Days per week: Not on file    Minutes per session: Not on file  . Stress: Not on file  Relationships  . Social connections:    Talks on phone: Not on file    Gets together: Not on file    Attends religious service: Not on file    Active member of club or organization: Not on file    Attends meetings of clubs or organizations: Not on file    Relationship status: Not on file  . Intimate partner violence:    Fear of current or ex partner: Not on file    Emotionally abused: Not on file    Physically abused: Not on file    Forced sexual activity: Not on file  Other Topics Concern  . Not on file  Social History Narrative   Lives at home with husband. Independent at baseline.     Family History  Problem Relation Age of Onset  . Emphysema Father   . Asthma Father   . Heart disease Father   . Heart attack Father   . Arthritis Father        RA  . Colon cancer Father   . Hypertension Mother   . Breast cancer Neg Hx      ROS:  Please see the history of present illness.     All other systems reviewed and negative.    Physical  Exam: Blood pressure 120/80, pulse 80, height 5\' 3"  (1.6 m), weight 160 lb (72.6 kg). General: Well developed, well nourished female in no acute distress. Head: Normocephalic, atraumatic, sclera non-icteric, no xanthomas, nares are without discharge. EENT: normal  Lymph Nodes:  none Neck: Negative for carotid bruits. JVD not elevated. Back:without scoliosis kyphosis Lungs: Clear bilaterally to auscultation without wheezes, rales, or rhonchi. Breathing is unlabored. Heart: RRR with S1 S2. No  murmur . No rubs, or gallops appreciated. Abdomen: Soft, non-tender, non-distended with normoactive bowel sounds. No hepatomegaly. No rebound/guarding. No obvious abdominal masses. Msk:  Strength and tone appear normal for age. Extremities: No clubbing or cyanosis. No edema.  Distal pedal pulses are 2+ and equal bilaterally. Skin: Warm and Dry Neuro: Alert and oriented X 3. CN III-XII intact Grossly normal sensory and motor function . Psych:  Responds to questions appropriately with a normal affect.      Labs: Cardiac Enzymes No results for input(s): CKTOTAL, CKMB, TROPONINI in the last 72 hours. CBC Lab Results  Component Value Date   WBC 11.4 (H) 05/30/2017   HGB 14.1 05/30/2017   HCT 41.9 05/30/2017   MCV 80.8 05/30/2017   PLT 234 05/30/2017   PROTIME: No results for input(s): LABPROT, INR in the last 72 hours. Chemistry No results for input(s): NA, K, CL, CO2, BUN, CREATININE, CALCIUM, PROT, BILITOT, ALKPHOS, ALT, AST, GLUCOSE in the last 168 hours.  Invalid input(s): LABALBU Lipids Lab Results  Component Value Date   CHOL 193 06/11/2016   HDL 40 06/11/2016   LDLCALC 121 (H) 06/11/2016   TRIG 159 (H) 06/11/2016   BNP No results found for: PROBNP Thyroid Function Tests: No results for input(s): TSH, T4TOTAL, T3FREE, THYROIDAB in the last 72 hours.  Invalid input(s): FREET3 Miscellaneous No results found for:  DDIMER  Radiology/Studies:  No results found.\  EKG: Sinus    Assessment and Plan:  PVCs  COPD  The patient has symptomatic PVCs that have persisted despite efforts with beta-blockers and calcium blockers.

## 2017-07-03 NOTE — Progress Notes (Signed)
ELECTROPHYSIOLOGY CONSULT NOTE  Patient ID: Andrea [name], MRN: [id number], DOB/AGE: [birth year] 52 y.o. Admit date: (Not on file) Date of Consult: 07/03/2017  Primary Physician: Birdie Sons, MD Primary Cardiologist: MA     Andrea FERRAIOLO is a 52 y.o. female who is being seen today for the evaluation of PVC at the request of MA.    HPI Andrea [name] is a 52 y.o. female seen for PVCs.    She has a history of symptomatic palpitations.  A 2-week monitor  2018 demonstrated a rare PVC and 2 nonsustained runs of SVT.  5/18 and 3/19 she was seen in the emergency room for palpitations.  She was noted to be in bigeminy with a right bundle branch block inferior axis morphology PVC ECGs 6/18, 9/18, 11/18 were all personally reviewed and demonstrated no ventricular ectopy  After the most recent ER visit, previously prescribed calcium blocker was discontinued with the initiation of metoprolol which she tolerated poorly.  Its use is been complicated by hypotension.  Moreover, as her pressure has helped a little.  Over the last couple of months her PVCs have become increasingly problematic and increasingly frequent.  They are now occurring daily.  They are associated with chest discomfort-tightness, shortness of breath, lightheadedness and presyncope as well as aggravating her propensity towards anxiety.  She notes no specific triggers apart from that with exercise they get worse..  She uses non-caffeinated coffee.     She has had extensive evaluations for coronary disease as noted below because of problems with chest pain and T wave inversions V1 and V2  She also has a history of COPD in the context of ongoing tobacco use  DATE TEST EF   11/15 LHC  No sign CAD  1/17 Myoview 68% No ischemia  7/18 Echo   60-65 %         Date Holter-PVCs  6/18 Cant find data   4/19 9%        Date Cr  K  3/19 0.96 3.6            Past Medical History:  Diagnosis Date  . Anxiety    panic  attacks, chest pain  . Arthritis   . COPD (chronic obstructive pulmonary disease) ([place])   . GERD (gastroesophageal reflux disease)   . Hypertension    not medicated  . Non-obstructive CAD in native artery    a. cardiac cath 01/2014: showed minor irregularities, mildly elevated left ventricular end-diastolic pressure and normal ejection fraction; b. 03/2015 MV: low risk.  . Palpitations    a. 08/2016 Event Monitor: occas PVC's, two brief runs of SVT (4 and 7 beats).  Marland Kitchen PONV (postoperative nausea and vomiting)   . Symptomatic PVC's (premature ventricular contractions)   . Tobacco abuse   . Wears dentures    partial upper and lower      Surgical History:  Past Surgical History:  Procedure Laterality Date  . CARDIAC CATHETERIZATION  01/03/14  . CARDIAC CATHETERIZATION    . ESOPHAGOGASTRODUODENOSCOPY (EGD) WITH PROPOFOL N/A 07/11/2016   Procedure: ESOPHAGOGASTRODUODENOSCOPY (EGD) WITH PROPOFOL;  Surgeon: Lucilla Lame, MD;  Location: [facility];  Service: Endoscopy;  Laterality: N/A;  . EYE SURGERY    . IMAGE GUIDED SINUS SURGERY N/A 11/30/2014   Procedure: IMAGE GUIDED SINUS SURGERY;  Surgeon: Carloyn Manner, MD;  Location: [facility];  Service: ENT;  Laterality: N/A;  GAVE DISK TO CE CE  . MAXILLARY ANTROSTOMY Bilateral  11/30/2014   Procedure: MAXILLARY ANTROSTOMY;  Surgeon: Carloyn Manner, MD;  Location: [facility];  Service: ENT;  Laterality: Bilateral;  . SEPTOPLASTY N/A 11/30/2014   Procedure: SEPTOPLASTY;  Surgeon: Carloyn Manner, MD;  Location: [facility];  Service: ENT;  Laterality: N/A;  . SPHENOIDECTOMY Left 11/30/2014   Procedure: Coralee Pesa, ;  Surgeon: Carloyn Manner, MD;  Location: [facility];  Service: ENT;  Laterality: Left;  Marland Kitchen VAGINAL HYSTERECTOMY  1995   vaginal     Home Meds: Prior to Admission medications   Medication Sig Start Date End Date Taking? Authorizing Provider  albuterol (PROVENTIL HFA;VENTOLIN HFA) 108  (90 Base) MCG/ACT inhaler Inhale 2 puffs into the lungs every 6 (six) hours as needed for wheezing or shortness of breath. 06/11/16  Yes Birdie Sons, MD  albuterol (PROVENTIL) (2.5 MG/3ML) 0.083% nebulizer solution Take 3 mLs (2.5 mg total) by nebulization every 4 (four) hours as needed for wheezing or shortness of breath. Reported on 03/19/2015 03/19/15  Yes Theodoro Grist, MD  ALPRAZolam Duanne Moron) 1 MG tablet TAKE 1 TABLET BY MOUTH EVERY 8 HOURS AS NEEDED 03/03/17  Yes Birdie Sons, MD  ARIPiprazole (ABILIFY) 5 MG tablet Take 5 mg by mouth daily. Reported on 03/19/2015   Yes [provider]  clonazePAM (KLONOPIN) 1 MG tablet Take 1 tablet (1 mg total) by mouth 2 (two) times daily as needed. 02/14/17  Yes Birdie Sons, MD  dexlansoprazole (DEXILANT) 60 MG capsule Take 1 capsule (60 mg total) by mouth daily. 07/24/16  Yes Lucilla Lame, MD  metoprolol tartrate (LOPRESSOR) 25 MG tablet Take 1 tablet (25 mg total) by mouth 3 (three) times daily. 06/19/17 09/17/17 Yes Wellington Hampshire, MD  nitroGLYCERIN (NITROSTAT) 0.4 MG SL tablet Place 1 tablet (0.4 mg total) under the tongue every 5 (five) minutes as needed for chest pain. 12/08/14  Yes Wellington Hampshire, MD  Respiratory Therapy Supplies (FLUTTER) DEVI 1 Device by Does not apply route daily. 03/19/15  Yes Theodoro Grist, MD  sucralfate (CARAFATE) 1 g tablet TAKE 1 TABLET (1 G TOTAL) BY MOUTH 4 (FOUR) TIMES DAILY - WITH MEALS AND AT BEDTIME. 11/11/16  Yes Birdie Sons, MD  umeclidinium-vilanterol (ANORO ELLIPTA) 62.5-25 MCG/INH AEPB Inhale 1 puff into the lungs daily. 07/04/16  Yes Laverle Hobby, MD    Allergies:  Allergies  Allergen Reactions  . Alum & Mag Hydroxide-Simeth Diarrhea and Nausea Only  . Cefdinir Other (See Comments)    tachycardia  . Chantix [Varenicline]     Bad dreams  . Doxycycline Other (See Comments)    Nausea, migraine  . Ivp Dye [Iodinated Diagnostic Agents] Palpitations    Social History    Socioeconomic History  . Marital status: Married    Spouse name: Not on file  . Number of children: 3  . Years of education: Not on file  . Highest education level: Not on file  Occupational History  . Occupation: Disabled    Comment: Social Security as of 05/21/2011  Social Needs  . Financial resource strain: Not on file  . Food insecurity:    Worry: Not on file    Inability: Not on file  . Transportation needs:    Medical: Not on file    Non-medical: Not on file  Tobacco Use  . Smoking status: Current Every Day Smoker    Packs/day: 0.50    Years: 16.00    Pack years: 8.00    Types: Cigarettes  . Smokeless tobacco: Never  Used  . Tobacco comment: started smoking 52yo up to 1 ppd  Substance and Sexual Activity  . Alcohol use: No    Alcohol/week: 0.0 oz  . Drug use: No  . Sexual activity: Not on file  Lifestyle  . Physical activity:    Days per week: Not on file    Minutes per session: Not on file  . Stress: Not on file  Relationships  . Social connections:    Talks on phone: Not on file    Gets together: Not on file    Attends religious service: Not on file    Active member of club or organization: Not on file    Attends meetings of clubs or organizations: Not on file    Relationship status: Not on file  . Intimate partner violence:    Fear of current or ex partner: Not on file    Emotionally abused: Not on file    Physically abused: Not on file    Forced sexual activity: Not on file  Other Topics Concern  . Not on file  Social History Narrative   Lives at home with husband. Independent at baseline.     Family History  Problem Relation Age of Onset  . Emphysema Father   . Asthma Father   . Heart disease Father   . Heart attack Father   . Arthritis Father        RA  . Colon cancer Father   . Hypertension Mother   . Breast cancer Neg Hx      ROS:  Please see the history of present illness.     All other systems reviewed and negative.    Physical  Exam: Blood pressure 120/80, pulse 80, height 5\' 3"  (1.6 m), weight 160 lb (72.6 kg). General: Well developed, well nourished female in no acute distress. Head: Normocephalic, atraumatic, sclera non-icteric, no xanthomas, nares are without discharge. EENT: normal  Lymph Nodes:  none Neck: Negative for carotid bruits. JVD not elevated. Back:without scoliosis kyphosis Lungs: Clear bilaterally to auscultation without wheezes, rales, or rhonchi. Breathing is unlabored. Heart: RRR with S1 S2. No  murmur . No rubs, or gallops appreciated. Abdomen: Soft, non-tender, non-distended with normoactive bowel sounds. No hepatomegaly. No rebound/guarding. No obvious abdominal masses. Msk:  Strength and tone appear normal for age. Extremities: No clubbing or cyanosis. No edema.  Distal pedal pulses are 2+ and equal bilaterally. Skin: Warm and Dry Neuro: Alert and oriented X 3. CN III-XII intact Grossly normal sensory and motor function . Psych:  Responds to questions appropriately with a normal affect.      Labs: Cardiac Enzymes No results for input(s): CKTOTAL, CKMB, TROPONINI in the last 72 hours. CBC Lab Results  Component Value Date   WBC 11.4 (H) 05/30/2017   HGB 14.1 05/30/2017   HCT 41.9 05/30/2017   MCV 80.8 05/30/2017   PLT 234 05/30/2017   PROTIME: No results for input(s): LABPROT, INR in the last 72 hours. Chemistry No results for input(s): NA, K, CL, CO2, BUN, CREATININE, CALCIUM, PROT, BILITOT, ALKPHOS, ALT, AST, GLUCOSE in the last 168 hours.  Invalid input(s): LABALBU Lipids Lab Results  Component Value Date   CHOL 193 06/11/2016   HDL 40 06/11/2016   LDLCALC 121 (H) 06/11/2016   TRIG 159 (H) 06/11/2016   BNP No results found for: PROBNP Thyroid Function Tests: No results for input(s): TSH, T4TOTAL, T3FREE, THYROIDAB in the last 72 hours.  Invalid input(s): FREET3 Miscellaneous No results found for:  DDIMER  Radiology/Studies:  No results found.  EKG: Sinus  rhythm at 80 Interval 16/07/37 T wave inversions V1 and V2   Assessment and Plan:  PVCs-frequent right bundle branch block inferior axis morphology with a delayed intrinsicoid deflection and some fragmentation.  QS aVR and aVL suggesting a near [place] origin  No obstructive coronary disease  Tobacco abuse  COPD  Anxiety  The patient has significantly symptomatic PVCs that has been persistent despite calcium blocker and beta-blocker therapies.  She does not have obstructive coronary disease, hence, I think a 1C agent is an appropriate next step.  We have reviewed the potential benefits as well as the potential risks.  She will need post initiation treadmill testing  We also discussed catheter ablation.  The PVCs are of a somewhat unusual morphology.   In the event that drugs are unsuccessful or we are unable to wean them over the next year or 2 we will refer her for catheter ablation.  Encourage discontinuation of smoking     Virl Axe

## 2017-07-03 NOTE — Patient Instructions (Addendum)
Medication Instructions: - Your physician has recommended you make the following change in your medication:   1) START flecainide 100 mg- take 1 tablet (100 mg) by mouth TWICE daily  2) DECREASE lopressor (metoprolol tartrate) 25 mg- take 1 tablet (25 mg) by mouth TWICE daily  Labwork: - none ordered  Procedures/Testing: - Your physician has requested that you have an exercise tolerance test - 2 weeks (on a day Dr. Caryl Comes is in the office.  - you may eat a light breakfast/ lunch prior to your procedure - no caffeine for 24 hours prior to your test (coffee, tea, soft drinks, or chocolate)  - no smoking/ vaping for 4 hours prior to your test - you may take your regular medications the day of your test  - bring any inhalers with you to your test - wear comfortable clothing & tennis/ non-skid shoes to walk on the treadmill   Follow-Up: - Your physician recommends that you schedule a follow-up appointment in: 3 months with Dr. Caryl Comes.   Any Additional Special Instructions Will Be Listed Below (If Applicable).     If you need a refill on your cardiac medications before your next appointment, please call your pharmacy.

## 2017-07-04 ENCOUNTER — Ambulatory Visit: Payer: Medicare Other | Admitting: Cardiovascular Disease

## 2017-07-09 ENCOUNTER — Telehealth: Payer: Self-pay

## 2017-07-09 NOTE — Telephone Encounter (Signed)
LMTCB and schedule AWV. (see previous encounter) -MM

## 2017-07-17 ENCOUNTER — Telehealth: Payer: Self-pay | Admitting: Internal Medicine

## 2017-07-17 MED ORDER — DRONEDARONE HCL 400 MG PO TABS: 400.0000 mg | ORAL_TABLET | Freq: Two times a day (BID) | ORAL | 6 refills | Status: DC

## 2017-07-17 NOTE — Telephone Encounter (Signed)
S/w patient. She states the flecainide was working and then last night she started having the fast, fluttering of her heart like before and it has not stopped since starting back.   S/w Dr Caryl Comes. He recommended patient could stop the flecainide and start Multaq 400 mg by mouth two times a day to see if that would work. In that case, patient would not need the treadmill stress test.  Patient agreeable to the switch. Samples of Multaq left at front desk for patient to pick up along with savings card and number. Advised patient to find out how much it will cost her and then call the number for potential savings. She verbalized understanding. Rx sent to pharmacy.  Medication Samples have been provided to the patient.  Drug name: Multaq       Strength: 400mg         Qty: 6 boxes  LOT: [id number]  Exp.Date: 12/2017

## 2017-07-17 NOTE — Telephone Encounter (Signed)
Pt c/o medication issue:  1. Name of Medication: Flecainide    2. How are you currently taking this medication (dosage and times per day)? 2x a day 100 mg    3. Are you having a reaction (difficulty breathing--STAT)? No    4. What is your medication issue? Heart is beating really fast, fluttering all over again  Would like to know what to do since it has started back up

## 2017-07-18 NOTE — Telephone Encounter (Signed)
Patient son picked up samples.

## 2017-07-21 ENCOUNTER — Telehealth: Payer: Self-pay | Admitting: Internal Medicine

## 2017-07-21 NOTE — Telephone Encounter (Signed)
Please review and advise what patient should do with taking the Multaq.

## 2017-07-21 NOTE — Telephone Encounter (Signed)
I called and spoke with the patient and advised her to NOT take any more of the multaq with reported symptoms. She states she had a reaction the 1 st day and spoke with the pharmacist- he advised she try it a 2nd day to see if the same reaction occurred, which it did. I have also advised the patient just to stay off any flecainide as well tonight and that I will review with Dr. Caryl Comes on 5/21 when he is back in the office. I will call her back with further recommendations.  She is agreeable and voices understanding.

## 2017-07-21 NOTE — Telephone Encounter (Signed)
Pt calling stating the samples she took on Multaq she had some reactions to it   Lip got numb/face turned red/tongue felt like it was swollen/broke out in a sweat   She tried it for two days and both days same symptoms  Would like advise on this  Please call back

## 2017-07-22 MED ORDER — PROPAFENONE HCL ER 225 MG PO CP12: 225.0000 mg | ORAL_CAPSULE | Freq: Two times a day (BID) | ORAL | 6 refills | Status: DC

## 2017-07-22 NOTE — Telephone Encounter (Signed)
Reviewed the patient's symptoms with Dr. Caryl Comes. Orders received to stop multaq and try propafenone ER 225 mg BID. Per MD, this can sometimes be expensive for the patient, and if cost is an issue, can replace with plain propafenone 225 mg BID.  I have notified the patient of the above recommendations and potential with cost associated with the ER form of the drug. I advised her I will send this in to the pharmacy, but if she finds this is too expensive, then please call us and let us know and we will change to plain propafenone 225 BID.   The patient verbalizes understanding of the above.

## 2017-07-23 NOTE — Telephone Encounter (Signed)
Spoke with patient and she reports that when pharmacy received prescription they called her and asked if provider was aware of her current medications and risks for effects on the lungs. Advised that Dr. Caryl Comes is aware of her current health and medications when he prescribed this and she voices hesitation in starting it given the information her pharmacist reviewed with her. Let her know that I would send this over to Dr. Olin Pia nurse for her review of patients concerns. She verbalized understanding with no further questions at this time.

## 2017-07-23 NOTE — Telephone Encounter (Signed)
Pt states that the Propafenone has side effects that will affect her lungs. Pt has emphysema, COPD.

## 2017-07-23 NOTE — Telephone Encounter (Signed)
Spoke with pt who states she is currently trying to get her heart medications figured out. Pt to CB once everything is squared away. -MM

## 2017-07-24 NOTE — Telephone Encounter (Signed)
To Dr. Klein to review. 

## 2017-07-24 NOTE — Telephone Encounter (Signed)
Per Dr. Caryl Comes, he called and spoke with the patient. He has advised her to increase flecainide to 150 mg BID and not take the propafenone.   I left a message for the patient to call and clarify if she needs a new RX sent to the pharmacy for the flecainide.

## 2017-07-29 ENCOUNTER — Telehealth: Payer: Self-pay | Admitting: Internal Medicine

## 2017-07-29 ENCOUNTER — Emergency Department
Admission: EM | Admit: 2017-07-29 | Discharge: 2017-07-29 | Disposition: A | Discharge status: Home / Self Care | Payer: Medicare Other | Attending: Emergency Medicine | Admitting: Emergency Medicine

## 2017-07-29 ENCOUNTER — Emergency Department: Payer: Medicare Other

## 2017-07-29 DIAGNOSIS — R05 Cough: Secondary | ICD-10-CM | POA: Diagnosis not present

## 2017-07-29 DIAGNOSIS — R509 Fever, unspecified: Secondary | ICD-10-CM | POA: Diagnosis not present

## 2017-07-29 DIAGNOSIS — Z5321 Procedure and treatment not carried out due to patient leaving prior to being seen by health care provider: Secondary | ICD-10-CM | POA: Insufficient documentation

## 2017-07-29 NOTE — ED Notes (Signed)
Pt left AMA before bing seen by provider.

## 2017-07-29 NOTE — Telephone Encounter (Signed)
New Message   Pt c/o medication issue:  1. Name of Medication: propafenone (RYTHMOL SR) 225 MG 12 hr capsule  2. How are you currently taking this medication (dosage and times per day)? Take 1 capsule (225 mg total) by mouth 2 (two) times daily  3. Are you having a reaction (difficulty breathing--STAT)?  4. What is your medication issue? Cindy at [facility] states that this medication has a counter interaction with her COPD. Please call

## 2017-07-29 NOTE — Telephone Encounter (Signed)
I left a message for the patient to call. 

## 2017-07-29 NOTE — Telephone Encounter (Signed)
I called and spoke with Jenny Reichmann at the pharmacy and advised her that Dr. Caryl Comes spoke with the patient last week and advised her not to take multaq, but to increase flecainide to 150 mg BID. I advised I have tried to contact the patient to see if she has enough flecainide or if she needs me to send this in for her, but I have not heard back from the patient.  Jenny Reichmann states she will make a note that the multaq has been stopped and she will try to reach the patient later as well.

## 2017-07-29 NOTE — ED Triage Notes (Signed)
Pt presents via POV c/o "sick for past 3 days". Reports subjective fevers with chills however did not record temp at home. Reports hx COPD. States has non productive cough and sinus drainage.

## 2017-07-30 ENCOUNTER — Other Ambulatory Visit: Payer: Self-pay

## 2017-07-30 ENCOUNTER — Emergency Department
Admission: EM | Admit: 2017-07-30 | Discharge: 2017-07-30 | Disposition: A | Discharge status: Home / Self Care | Payer: Medicare Other | Attending: Emergency Medicine | Admitting: Emergency Medicine

## 2017-07-30 ENCOUNTER — Encounter: Payer: Self-pay | Admitting: Emergency Medicine

## 2017-07-30 ENCOUNTER — Ambulatory Visit: Payer: Medicare Other | Admitting: Family Medicine

## 2017-07-30 ENCOUNTER — Emergency Department: Payer: Medicare Other

## 2017-07-30 DIAGNOSIS — F17210 Nicotine dependence, cigarettes, uncomplicated: Secondary | ICD-10-CM | POA: Insufficient documentation

## 2017-07-30 DIAGNOSIS — R0602 Shortness of breath: Secondary | ICD-10-CM | POA: Diagnosis present

## 2017-07-30 DIAGNOSIS — I10 Essential (primary) hypertension: Secondary | ICD-10-CM | POA: Insufficient documentation

## 2017-07-30 DIAGNOSIS — I2510 Atherosclerotic heart disease of native coronary artery without angina pectoris: Secondary | ICD-10-CM | POA: Insufficient documentation

## 2017-07-30 DIAGNOSIS — Z79899 Other long term (current) drug therapy: Secondary | ICD-10-CM | POA: Diagnosis not present

## 2017-07-30 DIAGNOSIS — J441 Chronic obstructive pulmonary disease with (acute) exacerbation: Secondary | ICD-10-CM | POA: Diagnosis not present

## 2017-07-30 MED ORDER — GUAIFENESIN 100 MG/5ML PO SOLN: 5.0000 mL | ORAL | 0 refills | Status: DC | PRN

## 2017-07-30 MED ORDER — ALBUTEROL SULFATE (2.5 MG/3ML) 0.083% IN NEBU
5.0000 mg | INHALATION_SOLUTION | Freq: Once | RESPIRATORY_TRACT | Status: AC
  Administered 2017-07-30: 5 mg via RESPIRATORY_TRACT
  Filled 2017-07-30: qty 6

## 2017-07-30 MED ORDER — AZITHROMYCIN 250 MG PO TABS: ORAL_TABLET | ORAL | 0 refills | Status: DC

## 2017-07-30 MED ORDER — PREDNISONE 20 MG PO TABS: 40.0000 mg | ORAL_TABLET | Freq: Every day | ORAL | 0 refills | Status: DC

## 2017-07-30 MED ORDER — FLECAINIDE ACETATE 150 MG PO TABS: 150.0000 mg | ORAL_TABLET | Freq: Two times a day (BID) | ORAL | 6 refills | Status: DC

## 2017-07-30 NOTE — ED Notes (Signed)
Pt arrived w/ c/o shortness of breath for last couple of days. Pt states that she has not eaten in the last couple of days due to the fact she "doesn't have any taste for food cause I can't breathe."

## 2017-07-30 NOTE — ED Triage Notes (Signed)
Pt reports started 3 days ago with sinus congestion then a dry cough and now feeling more short of breath. Pt was here last night and left due to the wait. Pt talking in full and complete sentences with no difficulty at this time.

## 2017-07-30 NOTE — ED Provider Notes (Signed)
[facility] Emergency Department Provider Note  ____________________________________________  Time seen: Approximately 8:15 AM  I have reviewed the triage vital signs and the nursing notes.   HISTORY  Chief Complaint Shortness of Breath    HPI Andrea [name] is a 52 y.o. female with a history of hypertension and COPD who complains of shortness of breath and nasal congestion for the past 2 days. No cough. No fever or chills. She has felt more wheezy but not relieved by her albuterol at home. Symptoms are constant, no aggravating or alleviating factors, moderate intensity.  Denies any recent travel trauma hospitalization or surgery. No history of DVT or PE.      Past Medical History:  Diagnosis Date  . Anxiety    panic attacks, chest pain  . Arthritis   . COPD (chronic obstructive pulmonary disease) ([place])   . GERD (gastroesophageal reflux disease)   . Hypertension    not medicated  . Non-obstructive CAD in native artery    a. cardiac cath 01/2014: showed minor irregularities, mildly elevated left ventricular end-diastolic pressure and normal ejection fraction; b. 03/2015 MV: low risk.  . Palpitations    a. 08/2016 Event Monitor: occas PVC's, two brief runs of SVT (4 and 7 beats).  Marland Kitchen PONV (postoperative nausea and vomiting)   . Symptomatic PVC's (premature ventricular contractions)   . Tobacco abuse   . Wears dentures    partial upper and lower     Patient Active Problem List   Diagnosis Date Noted  . Varicose veins of bilateral lower extremities with pain 11/22/2016  . Restless leg 11/18/2016  . Iron deficiency 11/18/2016  . Allergic rhinitis 06/11/2016  . Hyperlipidemia 03/19/2015  . COPD exacerbation ([place]) 10/04/2014  . Anxiety 09/22/2014  . Depression 09/22/2014  . Dyshidrosis 09/22/2014  . GERD (gastroesophageal reflux disease) 09/22/2014  . Insomnia 09/22/2014  . Lung nodule, multiple 09/22/2014  . Panic disorder 09/22/2014  .  Palpitations 01/17/2014  . Pulmonary nodule 06/10/2013  . Shortness of breath 05/20/2013  . COPD, GOLD B 05/20/2013  . Tobacco abuse 05/20/2013  . Daytime somnolence 05/20/2013  . Back pain 08/28/2012  . Discoid lupus 05/30/2012  . Cellulitis and abscess of face 05/30/2012  . Generalized abdominal pain 03/11/2012  . Pain in joint 03/09/2012  . Psoriasis 02/21/2012  . Fibromyalgia 02/11/2012  . Lupus ([place]) 02/11/2012  . Panic attacks 02/11/2012  . History of adenomatous polyp of colon 09/13/2011  . Heartburn 08/06/2011  . PVC's (premature ventricular contractions) 01/29/2011  . Essential (primary) hypertension 03/04/1998     Past Surgical History:  Procedure Laterality Date  . CARDIAC CATHETERIZATION  01/03/14  . CARDIAC CATHETERIZATION    . ESOPHAGOGASTRODUODENOSCOPY (EGD) WITH PROPOFOL N/A 07/11/2016   Procedure: ESOPHAGOGASTRODUODENOSCOPY (EGD) WITH PROPOFOL;  Surgeon: Lucilla Lame, MD;  Location: [place];  Service: Endoscopy;  Laterality: N/A;  . EYE SURGERY    . IMAGE GUIDED SINUS SURGERY N/A 11/30/2014   Procedure: IMAGE GUIDED SINUS SURGERY;  Surgeon: Carloyn Manner, MD;  Location: [place];  Service: ENT;  Laterality: N/A;  GAVE DISK TO CE CE  . MAXILLARY ANTROSTOMY Bilateral 11/30/2014   Procedure: MAXILLARY ANTROSTOMY;  Surgeon: Carloyn Manner, MD;  Location: [place];  Service: ENT;  Laterality: Bilateral;  . SEPTOPLASTY N/A 11/30/2014   Procedure: SEPTOPLASTY;  Surgeon: Carloyn Manner, MD;  Location: [place];  Service: ENT;  Laterality: N/A;  . SPHENOIDECTOMY Left 11/30/2014   Procedure: Coralee Pesa, ;  Surgeon: Carloyn Manner,  MD;  Location: [place];  Service: ENT;  Laterality: Left;  Marland Kitchen VAGINAL HYSTERECTOMY  1995   vaginal     Prior to Admission medications   Medication Sig Start Date End Date Taking? Authorizing Provider  albuterol (PROVENTIL HFA;VENTOLIN HFA) 108 (90 Base) MCG/ACT inhaler Inhale 2  puffs into the lungs every 6 (six) hours as needed for wheezing or shortness of breath. 06/11/16   Birdie Sons, MD  albuterol (PROVENTIL) (2.5 MG/3ML) 0.083% nebulizer solution Take 3 mLs (2.5 mg total) by nebulization every 4 (four) hours as needed for wheezing or shortness of breath. Reported on 03/19/2015 03/19/15   Theodoro Grist, MD  ALPRAZolam Duanne Moron) 1 MG tablet TAKE 1 TABLET BY MOUTH EVERY 8 HOURS AS NEEDED 03/03/17   Birdie Sons, MD  ARIPiprazole (ABILIFY) 5 MG tablet Take 5 mg by mouth daily. Reported on 03/19/2015    [provider]  azithromycin (ZITHROMAX Z-PAK) 250 MG tablet Take 2 tablets (500 mg) on  Day 1,  followed by 1 tablet (250 mg) once daily on Days 2 through 5. 07/30/17   Carrie Mew, MD  clonazePAM (KLONOPIN) 1 MG tablet Take 1 tablet (1 mg total) by mouth 2 (two) times daily as needed. 02/14/17   Birdie Sons, MD  dexlansoprazole (DEXILANT) 60 MG capsule Take 1 capsule (60 mg total) by mouth daily. 07/24/16   Lucilla Lame, MD  guaiFENesin (ROBITUSSIN) 100 MG/5ML SOLN Take 5 mLs (100 mg total) by mouth every 4 (four) hours as needed for cough or to loosen phlegm. 07/30/17   Carrie Mew, MD  metoprolol tartrate (LOPRESSOR) 25 MG tablet Take 1 tablet (25 mg total) by mouth 2 (two) times daily. 07/03/17 10/01/17  Deboraha Sprang, MD  nitroGLYCERIN (NITROSTAT) 0.4 MG SL tablet Place 1 tablet (0.4 mg total) under the tongue every 5 (five) minutes as needed for chest pain. 12/08/14   Wellington Hampshire, MD  predniSONE (DELTASONE) 20 MG tablet Take 2 tablets (40 mg total) by mouth daily. 07/30/17   Carrie Mew, MD  Respiratory Therapy Supplies (FLUTTER) DEVI 1 Device by Does not apply route daily. 03/19/15   Theodoro Grist, MD  sucralfate (CARAFATE) 1 g tablet TAKE 1 TABLET (1 G TOTAL) BY MOUTH 4 (FOUR) TIMES DAILY - WITH MEALS AND AT BEDTIME. 11/11/16   Birdie Sons, MD  umeclidinium-vilanterol (ANORO ELLIPTA) 62.5-25 MCG/INH AEPB Inhale 1 puff into the  lungs daily. 07/04/16   Laverle Hobby, MD     Allergies Alum & mag hydroxide-simeth; Cefdinir; Chantix [varenicline]; Doxycycline; Multaq [dronedarone]; and Ivp dye [iodinated diagnostic agents]   Family History  Problem Relation Age of Onset  . Emphysema Father   . Asthma Father   . Heart disease Father   . Heart attack Father   . Arthritis Father        RA  . Colon cancer Father   . Hypertension Mother   . Breast cancer Neg Hx     Social History Social History   Tobacco Use  . Smoking status: Current Every Day Smoker    Packs/day: 0.50    Years: 16.00    Pack years: 8.00    Types: Cigarettes  . Smokeless tobacco: Never Used  . Tobacco comment: started smoking 52yo up to 1 ppd  Substance Use Topics  . Alcohol use: No    Alcohol/week: 0.0 oz  . Drug use: No    Review of Systems  Constitutional:   No fever or chills.  ENT:  No sore throat. Positive nasal congestion Cardiovascular:   No chest pain or syncope. Respiratory:   positive shortness of breath without cough. Gastrointestinal:   Negative for abdominal pain, vomiting and diarrhea.  Musculoskeletal:   Negative for focal pain or swelling All other systems reviewed and are negative except as documented above in ROS and HPI.  ____________________________________________   PHYSICAL EXAM:  VITAL SIGNS: ED Triage Vitals  Enc Vitals Group     BP 07/30/17 0434 112/70     Pulse Rate 07/30/17 0434 (!) 104     Resp 07/30/17 0434 18     Temp 07/30/17 0434 98.9 F (37.2 C)     Temp Source 07/30/17 0434 Oral     SpO2 07/30/17 0434 91 %     Weight 07/30/17 0442 160 lb (72.6 kg)     Height 07/30/17 0442 5\' 3"  (1.6 m)     Head Circumference --      Peak Flow --      Pain Score 07/30/17 0442 10     Pain Loc --      Pain Edu? --      Excl. in [place]? --     Vital signs reviewed, nursing assessments reviewed.   Constitutional:   Alert and oriented. Well appearing and in no distress. Eyes:   Conjunctivae  are normal. EOMI. PERRL. ENT      Head:   Normocephalic and atraumatic.      Nose:   No congestion/rhinnorhea.       Mouth/Throat:   MMM, no pharyngeal erythema. No peritonsillar mass.       Neck:   No meningismus. Full ROM. Hematological/Lymphatic/Immunilogical:   No cervical lymphadenopathy. Cardiovascular:   RRR. Symmetric bilateral radial and DP pulses.  No murmurs.  Respiratory:   Normal respiratory effort without tachypnea/retractions. diffuse expiratory wheezing, accentuated by FEV1 maneuver. Gastrointestinal:   Soft and nontender. Non distended. There is no CVA tenderness.  No rebound, rigidity, or guarding.  Musculoskeletal:   Normal range of motion in all extremities. No joint effusions.  No lower extremity tenderness.  No edema. Neurologic:   Normal speech and language.  Motor grossly intact. No acute focal neurologic deficits are appreciated.  Skin:    Skin is warm, dry and intact. No rash noted.  No petechiae, purpura, or bullae.  ____________________________________________    LABS (pertinent positives/negatives) (all labs ordered are listed, but only abnormal results are displayed) Labs Reviewed - No data to display ____________________________________________   EKG  interpreted by me Sinus tachycardia rate 103, normal axis intervals QRS ST segments , isolated T-wave inversion in V2 which is nonspecific. Unchanged compared to previous EKG 05/31/2017. No acute ischemic changes.  ____________________________________________    [id number]  Dg Chest 2 View  Result Date: 07/29/2017 CLINICAL DATA:  Fever/chills, cough EXAM: CHEST - 2 VIEW COMPARISON:  05/30/2017 FINDINGS: Lungs are clear.  No pleural effusion or pneumothorax. The heart is normal in size. Visualized osseous structures are within normal limits. IMPRESSION: Normal chest radiographs. Electronically Signed   By: Julian Hy M.D.   On: 07/29/2017 20:26     ____________________________________________   PROCEDURES Procedures  ____________________________________________    CLINICAL IMPRESSION / ASSESSMENT AND PLAN / ED COURSE  Pertinent labs & imaging results that were available during my care of the patient were reviewed by me and considered in my medical decision making (see chart for details).    patient well appearing no acute distress, unremarkable vital signs. On my exam not tachycardic.  Wells criteria at low risk. Presentation consistent with COPD exacerbation. I doubt ACS PE dissection AAA pneumothorax pericarditis or sepsis.  Clinical Course as of Jul 30 813  Wed Jul 30, 2017  0722 Cxr yesterday normal. No pneumonia. Will tx with prednisone, z pak, albuterol, f/u pcp.    [PS]    Clinical Course User Index [PS] Carrie Mew, MD     ____________________________________________   FINAL CLINICAL IMPRESSION(S) / ED DIAGNOSES    Final diagnoses:  COPD exacerbation [facility])     ED Discharge Orders        Ordered    azithromycin (ZITHROMAX Z-PAK) 250 MG tablet     07/30/17 0814    predniSONE (DELTASONE) 20 MG tablet  Daily     07/30/17 0814    guaiFENesin (ROBITUSSIN) 100 MG/5ML SOLN  Every 4 hours PRN     07/30/17 0814      Portions of this note were generated with dragon dictation software. Dictation errors may occur despite best attempts at proofreading.    Carrie Mew, MD 07/30/17 [phone]

## 2017-07-30 NOTE — Telephone Encounter (Signed)
I spoke with the patient. She states she will need a refill on her flecainide as she will be running out due to the dose increase per Dr. Caryl Comes.

## 2017-07-30 NOTE — Addendum Note (Signed)
Addended by: Alvis Lemmings C on: 07/30/2017 10:37 AM   Modules accepted: Orders

## 2017-07-30 NOTE — Progress Notes (Deleted)
Patient: Andrea [name] Female    DOB: 1965/09/24   52 y.o.   MRN: [id number] Visit Date: 07/30/2017  Today's Provider: Lelon Huh, MD   No chief complaint on file.  Subjective:    Fever   Associated symptoms include congestion. Pertinent negatives include no abdominal pain, chest pain, nausea or vomiting.       Allergies  Allergen Reactions  . Alum & Mag Hydroxide-Simeth Diarrhea and Nausea Only  . Cefdinir Other (See Comments)    tachycardia  . Chantix [Varenicline]     Bad dreams  . Doxycycline Other (See Comments)    Nausea, migraine  . Multaq [Dronedarone] Other (See Comments)    Lip/ mouth numbness/ tongue swelling  . Ivp Dye [Iodinated Diagnostic Agents] Palpitations     Current Outpatient Medications:  .  albuterol (PROVENTIL HFA;VENTOLIN HFA) 108 (90 Base) MCG/ACT inhaler, Inhale 2 puffs into the lungs every 6 (six) hours as needed for wheezing or shortness of breath., Disp: 1 Inhaler, Rfl: 3 .  albuterol (PROVENTIL) (2.5 MG/3ML) 0.083% nebulizer solution, Take 3 mLs (2.5 mg total) by nebulization every 4 (four) hours as needed for wheezing or shortness of breath. Reported on 03/19/2015, Disp: 75 mL, Rfl: 12 .  ALPRAZolam (XANAX) 1 MG tablet, TAKE 1 TABLET BY MOUTH EVERY 8 HOURS AS NEEDED, Disp: 60 tablet, Rfl: 3 .  ARIPiprazole (ABILIFY) 5 MG tablet, Take 5 mg by mouth daily. Reported on 03/19/2015, Disp: , Rfl:  .  azithromycin (ZITHROMAX Z-PAK) 250 MG tablet, Take 2 tablets (500 mg) on  Day 1,  followed by 1 tablet (250 mg) once daily on Days 2 through 5., Disp: 6 each, Rfl: 0 .  clonazePAM (KLONOPIN) 1 MG tablet, Take 1 tablet (1 mg total) by mouth 2 (two) times daily as needed., Disp: 90 tablet, Rfl: 3 .  dexlansoprazole (DEXILANT) 60 MG capsule, Take 1 capsule (60 mg total) by mouth daily., Disp: 30 capsule, Rfl: 6 .  guaiFENesin (ROBITUSSIN) 100 MG/5ML SOLN, Take 5 mLs (100 mg total) by mouth every 4 (four) hours as needed for cough or to loosen  phlegm., Disp: 120 mL, Rfl: 0 .  metoprolol tartrate (LOPRESSOR) 25 MG tablet, Take 1 tablet (25 mg total) by mouth 2 (two) times daily., Disp: , Rfl:  .  nitroGLYCERIN (NITROSTAT) 0.4 MG SL tablet, Place 1 tablet (0.4 mg total) under the tongue every 5 (five) minutes as needed for chest pain., Disp: 25 tablet, Rfl: 0 .  predniSONE (DELTASONE) 20 MG tablet, Take 2 tablets (40 mg total) by mouth daily., Disp: 8 tablet, Rfl: 0 .  Respiratory Therapy Supplies (FLUTTER) DEVI, 1 Device by Does not apply route daily., Disp: 1 each, Rfl: 0 .  sucralfate (CARAFATE) 1 g tablet, TAKE 1 TABLET (1 G TOTAL) BY MOUTH 4 (FOUR) TIMES DAILY - WITH MEALS AND AT BEDTIME., Disp: 28 tablet, Rfl: 11 .  umeclidinium-vilanterol (ANORO ELLIPTA) 62.5-25 MCG/INH AEPB, Inhale 1 puff into the lungs daily., Disp: 60 each, Rfl: 5  Review of Systems  Constitutional: Positive for fever. Negative for appetite change, chills and fatigue.  HENT: Positive for congestion.   Respiratory: Negative for chest tightness and shortness of breath.   Cardiovascular: Negative for chest pain and palpitations.  Gastrointestinal: Negative for abdominal pain, nausea and vomiting.  Neurological: Negative for dizziness and weakness.    Social History   Tobacco Use  . Smoking status: Current Every Day Smoker    Packs/day: 0.50  Years: 16.00    Pack years: 8.00    Types: Cigarettes  . Smokeless tobacco: Never Used  . Tobacco comment: started smoking 52yo up to 1 ppd  Substance Use Topics  . Alcohol use: No    Alcohol/week: 0.0 oz   Objective:   There were no vitals taken for this visit. There were no vitals filed for this visit.   Physical Exam      Assessment & Plan:           Lelon Huh, MD  [facility]

## 2017-07-30 NOTE — ED Notes (Signed)
Pt with resp sx x 3 days. Cough, congestion and shortness of breath

## 2017-07-31 ENCOUNTER — Other Ambulatory Visit: Payer: Self-pay

## 2017-07-31 ENCOUNTER — Emergency Department (HOSPITAL_COMMUNITY)
Admission: EM | Admit: 2017-07-31 | Discharge: 2017-07-31 | Disposition: A | Discharge status: Home / Self Care | Payer: Medicare Other | Attending: Emergency Medicine | Admitting: Emergency Medicine

## 2017-07-31 ENCOUNTER — Emergency Department (HOSPITAL_COMMUNITY): Payer: Medicare Other

## 2017-07-31 DIAGNOSIS — I2510 Atherosclerotic heart disease of native coronary artery without angina pectoris: Secondary | ICD-10-CM | POA: Insufficient documentation

## 2017-07-31 DIAGNOSIS — F17210 Nicotine dependence, cigarettes, uncomplicated: Secondary | ICD-10-CM | POA: Insufficient documentation

## 2017-07-31 DIAGNOSIS — J441 Chronic obstructive pulmonary disease with (acute) exacerbation: Secondary | ICD-10-CM | POA: Diagnosis not present

## 2017-07-31 DIAGNOSIS — I10 Essential (primary) hypertension: Secondary | ICD-10-CM | POA: Insufficient documentation

## 2017-07-31 DIAGNOSIS — Z79899 Other long term (current) drug therapy: Secondary | ICD-10-CM | POA: Diagnosis not present

## 2017-07-31 DIAGNOSIS — R0789 Other chest pain: Secondary | ICD-10-CM | POA: Diagnosis not present

## 2017-07-31 DIAGNOSIS — R0602 Shortness of breath: Secondary | ICD-10-CM | POA: Diagnosis not present

## 2017-07-31 LAB — BASIC METABOLIC PANEL
Anion gap: 11 (ref 5–15)
BUN: 9 mg/dL (ref 6–20)
CO2: 27 mmol/L (ref 22–32)
CREATININE: 0.82 mg/dL (ref 0.44–1.00)
Calcium: 9.3 mg/dL (ref 8.9–10.3)
Chloride: 100 mmol/L — ABNORMAL LOW (ref 101–111)
GFR calc Af Amer: >60 mL/min (ref >60)
GFR calc non Af Amer: >60 mL/min (ref >60)
Glucose, Bld: 123 mg/dL — ABNORMAL HIGH (ref 65–99)
POTASSIUM: 3.5 mmol/L (ref 3.5–5.1)
SODIUM: 138 mmol/L (ref 135–145)

## 2017-07-31 LAB — CBC
HEMATOCRIT: 45.1 % (ref 36.0–46.0)
Hemoglobin: 14.5 g/dL (ref 12.0–15.0)
MCH: 26.2 pg (ref 26.0–34.0)
MCHC: 32.2 g/dL (ref 30.0–36.0)
MCV: 81.6 fL (ref 78.0–100.0)
PLATELETS: 220 10*3/uL (ref 150–400)
RBC: 5.53 MIL/uL — ABNORMAL HIGH (ref 3.87–5.11)
RDW: 14.7 % (ref 11.5–15.5)
WBC: 11 10*3/uL — AB (ref 4.0–10.5)

## 2017-07-31 LAB — I-STAT TROPONIN, ED: Troponin i, poc: 0 ng/mL (ref 0.00–0.08)

## 2017-07-31 MED ORDER — ALBUTEROL SULFATE (2.5 MG/3ML) 0.083% IN NEBU
5.0000 mg | INHALATION_SOLUTION | Freq: Once | RESPIRATORY_TRACT | Status: AC
  Administered 2017-07-31: 5 mg via RESPIRATORY_TRACT
  Filled 2017-07-31: qty 6

## 2017-07-31 MED ORDER — METHYLPREDNISOLONE SODIUM SUCC 125 MG IJ SOLR
125.0000 mg | Freq: Once | INTRAMUSCULAR | Status: AC
  Administered 2017-07-31: 125 mg via INTRAVENOUS
  Filled 2017-07-31: qty 2

## 2017-07-31 MED ORDER — IPRATROPIUM-ALBUTEROL 0.5-2.5 (3) MG/3ML IN SOLN
3.0000 mL | Freq: Once | RESPIRATORY_TRACT | Status: AC
Start: 2017-07-31 — End: 2017-07-31
  Administered 2017-07-31: 3 mL via RESPIRATORY_TRACT
  Filled 2017-07-31: qty 3

## 2017-07-31 MED ORDER — MAGNESIUM SULFATE 2 GM/50ML IV SOLN
2.0000 g | Freq: Once | INTRAVENOUS | Status: AC
  Administered 2017-07-31: 2 g via INTRAVENOUS
  Filled 2017-07-31: qty 50

## 2017-07-31 NOTE — Discharge Instructions (Signed)
We suspect you had a COPD exacerbation causing her wheezing worsening today.  As you improved with a breathing treatment and steroids, we feel you are safe for discharge home.  Please continue taking your steroids that were prescribed as well as your breathing treatments and antibiotics.  Please follow-up with your primary doctor.  If any symptoms change or worsen, please return to the nearest emergency department.

## 2017-07-31 NOTE — ED Notes (Signed)
ED Provider at bedside. 

## 2017-07-31 NOTE — ED Provider Notes (Signed)
[facility] EMERGENCY DEPARTMENT Provider Note   CSN: [id number] Arrival date & time: 07/31/17  0710     History   Chief Complaint Chief Complaint  Patient presents with  . Shortness of Breath    HPI Andrea [name] is a 52 y.o. female.  The history is provided by the patient and medical records.  Wheezing   This is a recurrent problem. The current episode started more than 2 days ago. The problem occurs constantly. The problem has been gradually worsening. Associated symptoms include cough. Pertinent negatives include no chest pain, no fever, no abdominal pain, no vomiting, no diarrhea, no headaches, no rhinorrhea, no neck pain, no sputum production and no rash. There was no precipitant for this problem. She has tried beta-agonist inhalers for the symptoms. The treatment provided no relief. She has had prior hospitalizations. Her past medical history is significant for asthma, CAD and pneumonia (prior). Her past medical history does not include PE or DVT.    Past Medical History:  Diagnosis Date  . Anxiety    panic attacks, chest pain  . Arthritis   . COPD (chronic obstructive pulmonary disease) ([place])   . GERD (gastroesophageal reflux disease)   . Hypertension    not medicated  . Non-obstructive CAD in native artery    a. cardiac cath 01/2014: showed minor irregularities, mildly elevated left ventricular end-diastolic pressure and normal ejection fraction; b. 03/2015 MV: low risk.  . Palpitations    a. 08/2016 Event Monitor: occas PVC's, two brief runs of SVT (4 and 7 beats).  Marland Kitchen PONV (postoperative nausea and vomiting)   . Symptomatic PVC's (premature ventricular contractions)   . Tobacco abuse   . Wears dentures    partial upper and lower    Patient Active Problem List   Diagnosis Date Noted  . Varicose veins of bilateral lower extremities with pain 11/22/2016  . Restless leg 11/18/2016  . Iron deficiency 11/18/2016  . Allergic rhinitis 06/11/2016    . Hyperlipidemia 03/19/2015  . COPD exacerbation ([place]) 10/04/2014  . Anxiety 09/22/2014  . Depression 09/22/2014  . Dyshidrosis 09/22/2014  . GERD (gastroesophageal reflux disease) 09/22/2014  . Insomnia 09/22/2014  . Lung nodule, multiple 09/22/2014  . Panic disorder 09/22/2014  . Palpitations 01/17/2014  . Pulmonary nodule 06/10/2013  . Shortness of breath 05/20/2013  . COPD, GOLD B 05/20/2013  . Tobacco abuse 05/20/2013  . Daytime somnolence 05/20/2013  . Back pain 08/28/2012  . Discoid lupus 05/30/2012  . Cellulitis and abscess of face 05/30/2012  . Generalized abdominal pain 03/11/2012  . Pain in joint 03/09/2012  . Psoriasis 02/21/2012  . Fibromyalgia 02/11/2012  . Lupus ([place]) 02/11/2012  . Panic attacks 02/11/2012  . History of adenomatous polyp of colon 09/13/2011  . Heartburn 08/06/2011  . PVC's (premature ventricular contractions) 01/29/2011  . Essential (primary) hypertension 03/04/1998    Past Surgical History:  Procedure Laterality Date  . CARDIAC CATHETERIZATION  01/03/14  . CARDIAC CATHETERIZATION    . ESOPHAGOGASTRODUODENOSCOPY (EGD) WITH PROPOFOL N/A 07/11/2016   Procedure: ESOPHAGOGASTRODUODENOSCOPY (EGD) WITH PROPOFOL;  Surgeon: Lucilla Lame, MD;  Location: [facility];  Service: Endoscopy;  Laterality: N/A;  . EYE SURGERY    . IMAGE GUIDED SINUS SURGERY N/A 11/30/2014   Procedure: IMAGE GUIDED SINUS SURGERY;  Surgeon: Carloyn Manner, MD;  Location: [facility];  Service: ENT;  Laterality: N/A;  GAVE DISK TO CE CE  . MAXILLARY ANTROSTOMY Bilateral 11/30/2014   Procedure: MAXILLARY ANTROSTOMY;  Surgeon: Jeannie Fend  Vaught, MD;  Location: [facility];  Service: ENT;  Laterality: Bilateral;  . SEPTOPLASTY N/A 11/30/2014   Procedure: SEPTOPLASTY;  Surgeon: Carloyn Manner, MD;  Location: [facility];  Service: ENT;  Laterality: N/A;  . SPHENOIDECTOMY Left 11/30/2014   Procedure: Coralee Pesa, ;  Surgeon: Carloyn Manner, MD;   Location: [facility];  Service: ENT;  Laterality: Left;  Marland Kitchen VAGINAL HYSTERECTOMY  1995   vaginal     OB History    Gravida  4   Para  3   Term      Preterm      AB      Living        SAB      TAB      Ectopic      Multiple      Live Births               Home Medications    Prior to Admission medications   Medication Sig Start Date End Date Taking? Authorizing Provider  albuterol (PROVENTIL HFA;VENTOLIN HFA) 108 (90 Base) MCG/ACT inhaler Inhale 2 puffs into the lungs every 6 (six) hours as needed for wheezing or shortness of breath. 06/11/16   Birdie Sons, MD  albuterol (PROVENTIL) (2.5 MG/3ML) 0.083% nebulizer solution Take 3 mLs (2.5 mg total) by nebulization every 4 (four) hours as needed for wheezing or shortness of breath. Reported on 03/19/2015 03/19/15   Theodoro Grist, MD  ALPRAZolam Duanne Moron) 1 MG tablet TAKE 1 TABLET BY MOUTH EVERY 8 HOURS AS NEEDED 03/03/17   Birdie Sons, MD  ARIPiprazole (ABILIFY) 5 MG tablet Take 5 mg by mouth daily. Reported on 03/19/2015    [provider]  azithromycin (ZITHROMAX Z-PAK) 250 MG tablet Take 2 tablets (500 mg) on  Day 1,  followed by 1 tablet (250 mg) once daily on Days 2 through 5. 07/30/17   Carrie Mew, MD  clonazePAM (KLONOPIN) 1 MG tablet Take 1 tablet (1 mg total) by mouth 2 (two) times daily as needed. 02/14/17   Birdie Sons, MD  dexlansoprazole (DEXILANT) 60 MG capsule Take 1 capsule (60 mg total) by mouth daily. 07/24/16   Lucilla Lame, MD  flecainide (TAMBOCOR) 150 MG tablet Take 1 tablet (150 mg total) by mouth 2 (two) times daily. 07/30/17   Deboraha Sprang, MD  guaiFENesin (ROBITUSSIN) 100 MG/5ML SOLN Take 5 mLs (100 mg total) by mouth every 4 (four) hours as needed for cough or to loosen phlegm. 07/30/17   Carrie Mew, MD  metoprolol tartrate (LOPRESSOR) 25 MG tablet Take 1 tablet (25 mg total) by mouth 2 (two) times daily. 07/03/17 10/01/17  Deboraha Sprang, MD  nitroGLYCERIN  (NITROSTAT) 0.4 MG SL tablet Place 1 tablet (0.4 mg total) under the tongue every 5 (five) minutes as needed for chest pain. 12/08/14   Wellington Hampshire, MD  predniSONE (DELTASONE) 20 MG tablet Take 2 tablets (40 mg total) by mouth daily. 07/30/17   Carrie Mew, MD  Respiratory Therapy Supplies (FLUTTER) DEVI 1 Device by Does not apply route daily. 03/19/15   Theodoro Grist, MD  sucralfate (CARAFATE) 1 g tablet TAKE 1 TABLET (1 G TOTAL) BY MOUTH 4 (FOUR) TIMES DAILY - WITH MEALS AND AT BEDTIME. 11/11/16   Birdie Sons, MD  umeclidinium-vilanterol (ANORO ELLIPTA) 62.5-25 MCG/INH AEPB Inhale 1 puff into the lungs daily. 07/04/16   Laverle Hobby, MD    Family History Family History  Problem Relation Age of Onset  .  Emphysema Father   . Asthma Father   . Heart disease Father   . Heart attack Father   . Arthritis Father        RA  . Colon cancer Father   . Hypertension Mother   . Breast cancer Neg Hx     Social History Social History   Tobacco Use  . Smoking status: Current Every Day Smoker    Packs/day: 0.50    Years: 16.00    Pack years: 8.00    Types: Cigarettes  . Smokeless tobacco: Never Used  . Tobacco comment: started smoking 52yo up to 1 ppd  Substance Use Topics  . Alcohol use: No    Alcohol/week: 0.0 oz  . Drug use: No     Allergies   Alum & mag hydroxide-simeth; Cefdinir; Chantix [varenicline]; Doxycycline; Multaq [dronedarone]; and Ivp dye [iodinated diagnostic agents]   Review of Systems Review of Systems  Constitutional: Positive for chills. Negative for fatigue and fever.  HENT: Positive for congestion. Negative for rhinorrhea.   Eyes: Negative for visual disturbance.  Respiratory: Positive for cough, chest tightness, shortness of breath and wheezing. Negative for sputum production and choking.   Cardiovascular: Negative for chest pain.  Gastrointestinal: Negative for abdominal pain, diarrhea, nausea and vomiting.  Genitourinary: Negative for  flank pain and frequency.  Musculoskeletal: Negative for back pain, neck pain and neck stiffness.  Skin: Negative for rash and wound.  Neurological: Negative for light-headedness, numbness and headaches.  Psychiatric/Behavioral: Negative for agitation.  All other systems reviewed and are negative.    Physical Exam Updated Vital Signs BP 105/65 (BP Location: Left Arm)   Pulse 88   Temp 97.8 F (36.6 C) (Oral)   Resp (!) 24   Ht 5\' 3"  (1.6 m)   Wt 72.6 kg (160 lb)   SpO2 99%   BMI 28.34 kg/m   Physical Exam  Constitutional: She is oriented to person, place, and time. She appears well-developed and well-nourished. No distress.  HENT:  Head: Normocephalic and atraumatic.  Nose: Nose normal.  Mouth/Throat: Oropharynx is clear and moist. No oropharyngeal exudate.  Eyes: Pupils are equal, round, and reactive to light. Conjunctivae and EOM are normal.  Neck: Normal range of motion.  Cardiovascular: Normal rate.  No murmur heard. Pulmonary/Chest: No respiratory distress. She has wheezes. She has no rhonchi. She has no rales.  Abdominal: Soft. She exhibits no distension. There is no tenderness.  Musculoskeletal: She exhibits no edema or tenderness.  Neurological: She is alert and oriented to person, place, and time. No sensory deficit. She exhibits normal muscle tone.  Skin: Capillary refill takes less than 2 seconds. No rash noted. She is not diaphoretic. No erythema.  Psychiatric: She has a normal mood and affect.  Nursing note and vitals reviewed.    ED Treatments / Results  Labs (all labs ordered are listed, but only abnormal results are displayed) Labs Reviewed  BASIC METABOLIC PANEL - Abnormal; Notable for the following components:      Result Value   Chloride 100 (*)    Glucose, Bld 123 (*)    All other components within normal limits  CBC - Abnormal; Notable for the following components:   WBC 11.0 (*)    RBC 5.53 (*)    All other components within normal limits    I-STAT TROPONIN, ED    EKG EKG Interpretation  Date/Time:  Thursday Jul 31 2017 07:34:47 EDT Ventricular Rate:  92 PR Interval:  136 QRS Duration: 78  QT Interval:  376 QTC Calculation: 464 R Axis:   90 Text Interpretation:  ** Suspect arm lead reversal, interpretation assumes no reversal Sinus rhythm with frequent Premature ventricular complexes Right atrial enlargement Rightward axis Pulmonary disease pattern Abnormal ECG When compared to prior, more PVC seen.  No STEMI Confirmed by Antony Blackbird [phone]) on 07/31/2017 9:51:12 AM   Radiology Dg Chest 2 View  Result Date: 07/31/2017 CLINICAL DATA:  Shortness of breath and chest tightness.  Fever. EXAM: CHEST - 2 VIEW COMPARISON:  Jul 29, 2017 FINDINGS: Lungs are clear. The heart size and pulmonary vascularity are normal. No adenopathy. There is aortic atherosclerosis. No bone lesions. No pneumothorax. IMPRESSION: Aortic atherosclerosis.  No edema or consolidation. Aortic Atherosclerosis (ICD10-I70.0). Electronically Signed   By: Lowella Grip III M.D.   On: 07/31/2017 08:27   Dg Chest 2 View  Result Date: 07/29/2017 CLINICAL DATA:  Fever/chills, cough EXAM: CHEST - 2 VIEW COMPARISON:  05/30/2017 FINDINGS: Lungs are clear.  No pleural effusion or pneumothorax. The heart is normal in size. Visualized osseous structures are within normal limits. IMPRESSION: Normal chest radiographs. Electronically Signed   By: Julian Hy M.D.   On: 07/29/2017 20:26    Procedures Procedures (including critical care time)  Medications Ordered in ED Medications  albuterol (PROVENTIL) (2.5 MG/3ML) 0.083% nebulizer solution 5 mg (5 mg Nebulization Given 07/31/17 0729)  magnesium sulfate IVPB 2 g 50 mL (0 g Intravenous Stopped 07/31/17 1230)  methylPREDNISolone sodium succinate (SOLU-MEDROL) 125 mg/2 mL injection 125 mg (125 mg Intravenous Given 07/31/17 1114)  ipratropium-albuterol (DUONEB) 0.5-2.5 (3) MG/3ML nebulizer solution 3 mL (3 mLs  Nebulization Given 07/31/17 1114)     Initial Impression / Assessment and Plan / ED Course  I have reviewed the triage vital signs and the nursing notes.  Pertinent labs & imaging results that were available during my care of the patient were reviewed by me and considered in my medical decision making (see chart for details).     MISHEEL GOWANS is a 52 y.o. female with a prior history of COPD, multiple pneumonias, anxiety, hypertension, and CAD who presents with chills, cough, shortness breath, and wheezing.  Patient reports that for the last 4 days she has had worsening shortness of breath and wheezing.  She is also had a cough that is deep but nonproductive.  She reports that she went to an outside hospital yesterday and chest x-ray did not show pneumonia.  Patient was however started on prednisone and azithromycin.  She says that she has used her breathing treatments at home but has not yet filled the prednisone.  She is still feeling extremely short of breath and wheezing.  She denies significant chest pain but does report chest tightness.  She denies leg pain or leg swelling.  She reports no trauma.  She denies  urinary symptoms or GI symptoms.  She does report some chills but no fevers.  On exam, patient has wheezing in all lung fields.  Patient already received albuterol treatment in triage.  Patient had more PVCs which are reflected by the palpitation she is feeling.  She reports she takes flecainide.  She had nontender chest nontender back.  No flank tenderness.  No leg edema or tenderness seen.  EKG showed PVCs but no STEMI.  Based on symptoms I am concerned about continued COPD exacerbation.  Patient says that magnesium helped in the past, will give Solu-Medrol, magnesium, and a DuoNeb treatment.  Patient will likely be ambulated with pulse  ox to determine if she is safe for discharge home.  X-ray did not show evidence of pneumonia.  Laboratory testing revealed similar mild leukocytosis  was otherwise reassuring.  Patient will receive a troponin to the chest tightness and history of CAD.  Anticipate reassessment after treatment.  Patient reported resolution of her shortness of breath and wheezing.  On my reassessment she had no further wheezing.  X-ray did not show pneumonia and patient otherwise appears well.  Patient was felt stable for discharge home.  Patient will continue her antibiotics and steroids that she was prescribed.  Patient understood return precautions and will follow with PCP.  Patient had no other questions or concerns and was discharged in good condition.   Final Clinical Impressions(s) / ED Diagnoses   Final diagnoses:  COPD exacerbation [facility])    ED Discharge Orders    None      Clinical Impression: 1. COPD exacerbation ([place])     Disposition: Discharge  Condition: Good  I have discussed the results, Dx and Tx plan with the pt(& family if present). He/she/they expressed understanding and agree(s) with the plan. Discharge instructions discussed at great length. Strict return precautions discussed and pt &/or family have verbalized understanding of the instructions. No further questions at time of discharge.    New Prescriptions   No medications on file    Follow Up: Birdie Sons, MD [address] [place] [address] [phone]     [facility] EMERGENCY DEPARTMENT [address] [id number] mc [place]       Peighton Edgin, Gwenyth Allegra, MD 07/31/17 [phone]

## 2017-07-31 NOTE — ED Triage Notes (Signed)
Pt here with SOB for the last 4 days, denies recent fever, hx of COPD, current smoker. Tried home inhalers but no improvement. Wheezing in all fields. Able to speak in complete sentences. Dyspnea with exertion.

## 2017-08-01 NOTE — Progress Notes (Signed)
Patient: Andrea [name] Female    DOB: 1965-08-29   52 y.o.   MRN: [id number] Visit Date: 08/04/2017  Today's Provider: Lelon Huh, MD   Chief Complaint  Patient presents with  . Follow-up   Subjective:    HPI  Follow up ER visit  Patient was seen in ER multiple times within the past couple of weeks for COPD exacerbation. Treatment for this included giving patient mucomyst. Patient did not tolerate this well and was then given magnesium. Prednisone that was prescribed at at another ER visit was extended.  She reports good compliance with treatment. She reports this condition is Unchanged. Patient still has coughing spells and episodes of shortness of breath. She also reports PND.  She reports that she has competed all doses of the antibiotic, and is still taking prednisone as prescribed.   Is on prednisone taper. Has finished antibiotic but is still coughing up yellow phelgm, although does not feel as short of breath. Is still having a lot of pressure in her sinuses.  ------------------------------------------------------------------------------------     Allergies  Allergen Reactions  . Alum & Mag Hydroxide-Simeth Diarrhea and Nausea Only  . Cefdinir Other (See Comments)    tachycardia  . Chantix [Varenicline]     Bad dreams  . Doxycycline Other (See Comments)    Nausea, migraine  . Multaq [Dronedarone] Other (See Comments)    Lip/ mouth numbness/ tongue swelling  . Ivp Dye [Iodinated Diagnostic Agents] Palpitations     Current Outpatient Medications:  .  albuterol (PROVENTIL HFA;VENTOLIN HFA) 108 (90 Base) MCG/ACT inhaler, Inhale 2 puffs into the lungs every 6 (six) hours as needed for wheezing or shortness of breath., Disp: 1 Inhaler, Rfl: 3 .  albuterol (PROVENTIL) (2.5 MG/3ML) 0.083% nebulizer solution, Take 3 mLs (2.5 mg total) by nebulization every 4 (four) hours as needed for wheezing or shortness of breath. Reported on 03/19/2015, Disp: 75 mL, Rfl: 12 .   ALPRAZolam (XANAX) 1 MG tablet, TAKE 1 TABLET BY MOUTH EVERY 8 HOURS AS NEEDED (Patient taking differently: TAKE 1 TABLET BY MOUTH EVERY 8 HOURS AS NEEDED SEVERE ANXIETY), Disp: 60 tablet, Rfl: 3 .  ARIPiprazole (ABILIFY) 5 MG tablet, Take 5 mg by mouth daily as needed (anxiety and depression). , Disp: , Rfl:  .  chlorpheniramine-HYDROcodone (TUSSIONEX PENNKINETIC ER) 10-8 MG/5ML SUER, Take 5 mLs by mouth 2 (two) times daily., Disp: 140 mL, Rfl: 0 .  clonazePAM (KLONOPIN) 1 MG tablet, Take 1 tablet (1 mg total) by mouth 2 (two) times daily as needed., Disp: 90 tablet, Rfl: 3 .  dexlansoprazole (DEXILANT) 60 MG capsule, Take 1 capsule (60 mg total) by mouth daily., Disp: 30 capsule, Rfl: 6 .  flecainide (TAMBOCOR) 150 MG tablet, Take 1 tablet (150 mg total) by mouth 2 (two) times daily., Disp: 60 tablet, Rfl: 6 .  guaiFENesin (ROBITUSSIN) 100 MG/5ML SOLN, Take 5 mLs (100 mg total) by mouth every 4 (four) hours as needed for cough or to loosen phlegm., Disp: 120 mL, Rfl: 0 .  hydroxychloroquine (PLAQUENIL) 200 MG tablet, Take 200 mg by mouth 2 (two) times daily., Disp: , Rfl:  .  metoprolol tartrate (LOPRESSOR) 25 MG tablet, Take 1 tablet (25 mg total) by mouth 2 (two) times daily., Disp: , Rfl:  .  nitroGLYCERIN (NITROSTAT) 0.4 MG SL tablet, Place 1 tablet (0.4 mg total) under the tongue every 5 (five) minutes as needed for chest pain., Disp: 25 tablet, Rfl: 0 .  predniSONE (  DELTASONE) 20 MG tablet, Take 2 tablets (40 mg total) by mouth daily., Disp: 8 tablet, Rfl: 0 .  predniSONE (STERAPRED UNI-PAK 21 TAB) 10 MG (21) TBPK tablet, Per packaging directions - to start after current prednisone course, Disp: 21 tablet, Rfl: 0 .  Respiratory Therapy Supplies (FLUTTER) DEVI, 1 Device by Does not apply route daily., Disp: 1 each, Rfl: 0 .  sucralfate (CARAFATE) 1 g tablet, TAKE 1 TABLET (1 G TOTAL) BY MOUTH 4 (FOUR) TIMES DAILY - WITH MEALS AND AT BEDTIME., Disp: 28 tablet, Rfl: 11 .  umeclidinium-vilanterol  (ANORO ELLIPTA) 62.5-25 MCG/INH AEPB, Inhale 1 puff into the lungs daily., Disp: 60 each, Rfl: 5  Review of Systems  Constitutional: Negative for appetite change, chills, fatigue and fever.  HENT: Positive for postnasal drip.   Respiratory: Positive for cough, shortness of breath and wheezing. Negative for chest tightness.   Cardiovascular: Negative for chest pain and palpitations.  Gastrointestinal: Negative for abdominal pain, nausea and vomiting.  Neurological: Negative for dizziness and weakness.    Social History   Tobacco Use  . Smoking status: Current Every Day Smoker    Packs/day: 0.50    Years: 16.00    Pack years: 8.00    Types: Cigarettes  . Smokeless tobacco: Never Used  . Tobacco comment: started smoking 52yo up to 1 ppd  Substance Use Topics  . Alcohol use: No    Alcohol/week: 0.0 oz   Objective:   BP 92/60 (BP Location: Left Arm, Patient Position: Sitting, Cuff Size: Normal)   Pulse 87   Temp 98.5 F (36.9 C) (Oral)   Resp 18   Wt 154 lb (69.9 kg)   SpO2 98% Comment: room air  BMI 27.28 kg/m  Vitals:   08/04/17 1457  BP: 92/60  Pulse: 87  Resp: 18  Temp: 98.5 F (36.9 C)  TempSrc: Oral  SpO2: 98%  Weight: 154 lb (69.9 kg)     Physical Exam  General Appearance:    Alert, cooperative, no distress  HENT:   bilateral TM normal without fluid or infection. Frontal and maxillary sinuses tender.   Eyes:    PERRL, conjunctiva/corneas clear, EOM's intact       Lungs:     Clear to auscultation bilaterally, respirations unlabored  Heart:    Regular rate and rhythm  Neurologic:   Awake, alert, oriented x 3. No apparent focal neurological           defect.           Assessment & Plan:     1. Acute maxillary sinusitis, recurrence not specified  - amoxicillin-clavulanate (AUGMENTIN) 875-125 MG tablet; Take 1 tablet by mouth 2 (two) times daily for 10 days.  Dispense: 20 tablet; Refill: 0  2. COPD exacerbation ([place]) Improving on steroids and current  inhalers. Call if not completely resolved when finished with prednisone and Augmentin.        Lelon Huh, MD  [facility]

## 2017-08-02 ENCOUNTER — Encounter: Payer: Self-pay | Admitting: Emergency Medicine

## 2017-08-02 ENCOUNTER — Other Ambulatory Visit: Payer: Self-pay

## 2017-08-02 ENCOUNTER — Emergency Department
Admission: EM | Admit: 2017-08-02 | Discharge: 2017-08-02 | Disposition: A | Discharge status: Home / Self Care | Payer: Medicare Other | Attending: Emergency Medicine | Admitting: Emergency Medicine

## 2017-08-02 DIAGNOSIS — R0981 Nasal congestion: Secondary | ICD-10-CM | POA: Diagnosis not present

## 2017-08-02 DIAGNOSIS — R0789 Other chest pain: Secondary | ICD-10-CM | POA: Diagnosis not present

## 2017-08-02 DIAGNOSIS — I10 Essential (primary) hypertension: Secondary | ICD-10-CM | POA: Diagnosis not present

## 2017-08-02 DIAGNOSIS — F17210 Nicotine dependence, cigarettes, uncomplicated: Secondary | ICD-10-CM | POA: Diagnosis not present

## 2017-08-02 DIAGNOSIS — R0602 Shortness of breath: Secondary | ICD-10-CM | POA: Diagnosis not present

## 2017-08-02 DIAGNOSIS — Z79899 Other long term (current) drug therapy: Secondary | ICD-10-CM | POA: Diagnosis not present

## 2017-08-02 DIAGNOSIS — R05 Cough: Secondary | ICD-10-CM | POA: Diagnosis not present

## 2017-08-02 DIAGNOSIS — J441 Chronic obstructive pulmonary disease with (acute) exacerbation: Secondary | ICD-10-CM | POA: Diagnosis not present

## 2017-08-02 LAB — COMPREHENSIVE METABOLIC PANEL
ALBUMIN: 3.7 g/dL (ref 3.5–5.0)
ALT: 16 U/L (ref 14–54)
ANION GAP: 9 (ref 5–15)
AST: 17 U/L (ref 15–41)
Alkaline Phosphatase: 81 U/L (ref 38–126)
BILIRUBIN TOTAL: 0.5 mg/dL (ref 0.3–1.2)
BUN: 18 mg/dL (ref 6–20)
CALCIUM: 8.6 mg/dL — AB (ref 8.9–10.3)
CO2: 27 mmol/L (ref 22–32)
Chloride: 98 mmol/L — ABNORMAL LOW (ref 101–111)
Creatinine, Ser: 0.72 mg/dL (ref 0.44–1.00)
GFR calc non Af Amer: >60 mL/min (ref >60)
Glucose, Bld: 84 mg/dL (ref 65–99)
POTASSIUM: 4 mmol/L (ref 3.5–5.1)
Sodium: 134 mmol/L — ABNORMAL LOW (ref 135–145)
TOTAL PROTEIN: 7.4 g/dL (ref 6.5–8.1)

## 2017-08-02 LAB — CBC WITH DIFFERENTIAL/PLATELET
Basophils Absolute: 0.1 10*3/uL (ref 0–0.1)
Basophils Relative: 1 %
EOS ABS: 0.1 10*3/uL (ref 0–0.7)
Eosinophils Relative: 1 %
HEMATOCRIT: 41.1 % (ref 35.0–47.0)
Hemoglobin: 14.2 g/dL (ref 12.0–16.0)
LYMPHS ABS: 2.6 10*3/uL (ref 1.0–3.6)
Lymphocytes Relative: 21 %
MCH: 27.4 pg (ref 26.0–34.0)
MCHC: 34.5 g/dL (ref 32.0–36.0)
MCV: 79.4 fL — ABNORMAL LOW (ref 80.0–100.0)
MONO ABS: 0.9 10*3/uL (ref 0.2–0.9)
Monocytes Relative: 7 %
NEUTROS ABS: 8.6 10*3/uL — AB (ref 1.4–6.5)
NEUTROS PCT: 70 %
Platelets: 260 10*3/uL (ref 150–440)
RBC: 5.18 MIL/uL (ref 3.80–5.20)
RDW: 15 % — AB (ref 11.5–14.5)
WBC: 12.3 10*3/uL — AB (ref 3.6–11.0)

## 2017-08-02 LAB — MAGNESIUM: MAGNESIUM: 1.7 mg/dL (ref 1.7–2.4)

## 2017-08-02 MED ORDER — IPRATROPIUM-ALBUTEROL 0.5-2.5 (3) MG/3ML IN SOLN
3.0000 mL | Freq: Once | RESPIRATORY_TRACT | Status: AC
  Administered 2017-08-02: 3 mL via RESPIRATORY_TRACT
  Filled 2017-08-02: qty 3

## 2017-08-02 MED ORDER — PREDNISONE 10 MG (21) PO TBPK: ORAL_TABLET | ORAL | 0 refills | Status: DC

## 2017-08-02 MED ORDER — HYDROCOD POLST-CPM POLST ER 10-8 MG/5ML PO SUER: 5.0000 mL | Freq: Two times a day (BID) | ORAL | 0 refills | Status: DC

## 2017-08-02 MED ORDER — ACETYLCYSTEINE 20 % IN SOLN
4.0000 mL | Freq: Once | RESPIRATORY_TRACT | Status: AC
  Administered 2017-08-02: 4 mL via RESPIRATORY_TRACT
  Filled 2017-08-02: qty 4

## 2017-08-02 MED ORDER — MAGNESIUM SULFATE 2 GM/50ML IV SOLN
2.0000 g | Freq: Once | INTRAVENOUS | Status: AC
  Administered 2017-08-02: 2 g via INTRAVENOUS
  Filled 2017-08-02: qty 50

## 2017-08-02 MED ORDER — ALBUTEROL SULFATE (2.5 MG/3ML) 0.083% IN NEBU
2.5000 mg | INHALATION_SOLUTION | Freq: Once | RESPIRATORY_TRACT | Status: AC
  Administered 2017-08-02: 2.5 mg via RESPIRATORY_TRACT
  Filled 2017-08-02: qty 3

## 2017-08-02 NOTE — ED Provider Notes (Signed)
[facility] Emergency Department Provider Note    ____________________________________________   I have reviewed the triage vital signs and the nursing notes.   HISTORY  Chief Complaint Shortness of Breath and Cough   History limited by: Not Limited   HPI Andrea [name] is a 52 y.o. female who presents to the emergency department today because of continued shortness of breath and chest congestion.  Patient states the symptoms been got ongoing for 1 week.  Has been accompanied by cough.  Patient has been seen twice now in the emergency department for the symptoms.  Patient has been put on a Z-Pak steroids as well as using her nebulizer treatments none of which have significantly helped.  She has had chest tightness.  She denies any fevers although has had chills.   Per medical record review patient has a history of COPD  Past Medical History:  Diagnosis Date  . Anxiety    panic attacks, chest pain  . Arthritis   . COPD (chronic obstructive pulmonary disease) ([place])   . GERD (gastroesophageal reflux disease)   . Hypertension    not medicated  . Non-obstructive CAD in native artery    a. cardiac cath 01/2014: showed minor irregularities, mildly elevated left ventricular end-diastolic pressure and normal ejection fraction; b. 03/2015 MV: low risk.  . Palpitations    a. 08/2016 Event Monitor: occas PVC's, two brief runs of SVT (4 and 7 beats).  Marland Kitchen PONV (postoperative nausea and vomiting)   . Symptomatic PVC's (premature ventricular contractions)   . Tobacco abuse   . Wears dentures    partial upper and lower    Patient Active Problem List   Diagnosis Date Noted  . Varicose veins of bilateral lower extremities with pain 11/22/2016  . Restless leg 11/18/2016  . Iron deficiency 11/18/2016  . Allergic rhinitis 06/11/2016  . Hyperlipidemia 03/19/2015  . COPD exacerbation ([place]) 10/04/2014  . Anxiety 09/22/2014  . Depression 09/22/2014  . Dyshidrosis  09/22/2014  . GERD (gastroesophageal reflux disease) 09/22/2014  . Insomnia 09/22/2014  . Lung nodule, multiple 09/22/2014  . Panic disorder 09/22/2014  . Palpitations 01/17/2014  . Pulmonary nodule 06/10/2013  . Shortness of breath 05/20/2013  . COPD, GOLD B 05/20/2013  . Tobacco abuse 05/20/2013  . Daytime somnolence 05/20/2013  . Back pain 08/28/2012  . Discoid lupus 05/30/2012  . Cellulitis and abscess of face 05/30/2012  . Generalized abdominal pain 03/11/2012  . Pain in joint 03/09/2012  . Psoriasis 02/21/2012  . Fibromyalgia 02/11/2012  . Lupus ([place]) 02/11/2012  . Panic attacks 02/11/2012  . History of adenomatous polyp of colon 09/13/2011  . Heartburn 08/06/2011  . PVC's (premature ventricular contractions) 01/29/2011  . Essential (primary) hypertension 03/04/1998    Past Surgical History:  Procedure Laterality Date  . CARDIAC CATHETERIZATION  01/03/14  . CARDIAC CATHETERIZATION    . ESOPHAGOGASTRODUODENOSCOPY (EGD) WITH PROPOFOL N/A 07/11/2016   Procedure: ESOPHAGOGASTRODUODENOSCOPY (EGD) WITH PROPOFOL;  Surgeon: Lucilla Lame, MD;  Location: [place];  Service: Endoscopy;  Laterality: N/A;  . EYE SURGERY    . IMAGE GUIDED SINUS SURGERY N/A 11/30/2014   Procedure: IMAGE GUIDED SINUS SURGERY;  Surgeon: Carloyn Manner, MD;  Location: [place];  Service: ENT;  Laterality: N/A;  GAVE DISK TO CE CE  . MAXILLARY ANTROSTOMY Bilateral 11/30/2014   Procedure: MAXILLARY ANTROSTOMY;  Surgeon: Carloyn Manner, MD;  Location: [place];  Service: ENT;  Laterality: Bilateral;  . SEPTOPLASTY N/A 11/30/2014   Procedure: SEPTOPLASTY;  Surgeon: Carloyn Manner, MD;  Location: [place];  Service: ENT;  Laterality: N/A;  . SPHENOIDECTOMY Left 11/30/2014   Procedure: Coralee Pesa, ;  Surgeon: Carloyn Manner, MD;  Location: [place];  Service: ENT;  Laterality: Left;  Marland Kitchen VAGINAL HYSTERECTOMY  1995   vaginal    Prior to Admission  medications   Medication Sig Start Date End Date Taking? Authorizing Provider  albuterol (PROVENTIL HFA;VENTOLIN HFA) 108 (90 Base) MCG/ACT inhaler Inhale 2 puffs into the lungs every 6 (six) hours as needed for wheezing or shortness of breath. 06/11/16  Yes Birdie Sons, MD  albuterol (PROVENTIL) (2.5 MG/3ML) 0.083% nebulizer solution Take 3 mLs (2.5 mg total) by nebulization every 4 (four) hours as needed for wheezing or shortness of breath. Reported on 03/19/2015 03/19/15  Yes Theodoro Grist, MD  ALPRAZolam (XANAX) 1 MG tablet TAKE 1 TABLET BY MOUTH EVERY 8 HOURS AS NEEDED Patient taking differently: TAKE 1 TABLET BY MOUTH EVERY 8 HOURS AS NEEDED SEVERE ANXIETY 03/03/17  Yes Birdie Sons, MD  ARIPiprazole (ABILIFY) 5 MG tablet Take 5 mg by mouth daily as needed (anxiety and depression).    Yes [provider]  azithromycin (ZITHROMAX Z-PAK) 250 MG tablet Take 2 tablets (500 mg) on  Day 1,  followed by 1 tablet (250 mg) once daily on Days 2 through 5. 07/30/17  Yes Carrie Mew, MD  clonazePAM (KLONOPIN) 1 MG tablet Take 1 tablet (1 mg total) by mouth 2 (two) times daily as needed. 02/14/17  Yes Birdie Sons, MD  dexlansoprazole (DEXILANT) 60 MG capsule Take 1 capsule (60 mg total) by mouth daily. 07/24/16  Yes Lucilla Lame, MD  flecainide (TAMBOCOR) 150 MG tablet Take 1 tablet (150 mg total) by mouth 2 (two) times daily. 07/30/17  Yes Deboraha Sprang, MD  guaiFENesin (ROBITUSSIN) 100 MG/5ML SOLN Take 5 mLs (100 mg total) by mouth every 4 (four) hours as needed for cough or to loosen phlegm. 07/30/17  Yes Carrie Mew, MD  hydroxychloroquine (PLAQUENIL) 200 MG tablet Take 200 mg by mouth 2 (two) times daily.   Yes [provider]  metoprolol tartrate (LOPRESSOR) 25 MG tablet Take 1 tablet (25 mg total) by mouth 2 (two) times daily. 07/03/17 10/01/17 Yes Deboraha Sprang, MD  nitroGLYCERIN (NITROSTAT) 0.4 MG SL tablet Place 1 tablet (0.4 mg total) under the tongue every 5  (five) minutes as needed for chest pain. 12/08/14  Yes Wellington Hampshire, MD  predniSONE (DELTASONE) 20 MG tablet Take 2 tablets (40 mg total) by mouth daily. 07/30/17  Yes Carrie Mew, MD  sucralfate (CARAFATE) 1 g tablet TAKE 1 TABLET (1 G TOTAL) BY MOUTH 4 (FOUR) TIMES DAILY - WITH MEALS AND AT BEDTIME. 11/11/16  Yes Birdie Sons, MD  umeclidinium-vilanterol (ANORO ELLIPTA) 62.5-25 MCG/INH AEPB Inhale 1 puff into the lungs daily. 07/04/16  Yes Laverle Hobby, MD  Respiratory Therapy Supplies (FLUTTER) DEVI 1 Device by Does not apply route daily. 03/19/15   Theodoro Grist, MD    Allergies Alum & mag hydroxide-simeth; Cefdinir; Chantix [varenicline]; Doxycycline; Multaq [dronedarone]; and Ivp dye [iodinated diagnostic agents]  Family History  Problem Relation Age of Onset  . Emphysema Father   . Asthma Father   . Heart disease Father   . Heart attack Father   . Arthritis Father        RA  . Colon cancer Father   . Hypertension Mother   . Breast cancer Neg Hx  Social History Social History   Tobacco Use  . Smoking status: Current Every Day Smoker    Packs/day: 0.50    Years: 16.00    Pack years: 8.00    Types: Cigarettes  . Smokeless tobacco: Never Used  . Tobacco comment: started smoking 52yo up to 1 ppd  Substance Use Topics  . Alcohol use: No    Alcohol/week: 0.0 oz  . Drug use: No    Review of Systems Constitutional: No fever/chills Eyes: No visual changes. ENT: No sore throat. Cardiovascular: Positive for chest tightness.  Respiratory: Positive for shortness of breath. Gastrointestinal: No abdominal pain.  No nausea, no vomiting.  No diarrhea.   Genitourinary: Negative for dysuria. Musculoskeletal: Negative for back pain. Skin: Negative for rash. Neurological: Negative for headaches, focal weakness or numbness.  ____________________________________________   PHYSICAL EXAM:  VITAL SIGNS: ED Triage Vitals  Enc Vitals Group     BP 08/02/17  1702 113/60     Pulse Rate 08/02/17 1702 94     Resp 08/02/17 1702 18     Temp 08/02/17 1702 98 F (36.7 C)     Temp Source 08/02/17 1702 Oral     SpO2 08/02/17 1702 96 %     Weight 08/02/17 1703 160 lb (72.6 kg)     Height 08/02/17 1703 5\' 3"  (1.6 m)     Head Circumference --      Peak Flow --      Pain Score 08/02/17 1707 0   Constitutional: Alert and oriented.  Eyes: Conjunctivae are normal.  ENT      Head: Normocephalic and atraumatic.      Nose: No congestion/rhinnorhea.      Mouth/Throat: Mucous membranes are moist.      Neck: No stridor. Hematological/Lymphatic/Immunilogical: No cervical lymphadenopathy. Cardiovascular: Normal rate, regular rhythm.  No murmurs, rubs, or gallops.  Respiratory: Normal respiratory effort without tachypnea nor retractions. Minimal expiratory wheezing bilaterally. Gastrointestinal: Soft and non tender. No rebound. No guarding.  Genitourinary: Deferred Musculoskeletal: Normal range of motion in all extremities. No lower extremity edema. Neurologic:  Normal speech and language. No gross focal neurologic deficits are appreciated.  Skin:  Skin is warm, dry and intact. No rash noted. Psychiatric: Mood and affect are normal. Speech and behavior are normal. Patient exhibits appropriate insight and judgment.  ____________________________________________    LABS (pertinent positives/negatives)  CBC wbc 12.3, hgb 14.2, plt 260 CMP na 134, k 4.0, glu 84, cr 0.72 Mag 1.7  ____________________________________________   EKG  None  ____________________________________________    RADIOLOGY  None  ____________________________________________   PROCEDURES  Procedures  ____________________________________________   INITIAL IMPRESSION / ASSESSMENT AND PLAN / ED COURSE  Pertinent labs & imaging results that were available during my care of the patient were reviewed by me and considered in my medical decision making (see chart for  details).   Patient presented to the emergency department today because of concerns for continued shortness of breath. On exam patient in no respiratory distress. Some very mild wheezing noted. Given patient's complaint of congestion will plan on ordering mucomyst to see if that helps.  Patient did not tolerate mucomyst. Given that she states she had good response to magnesium will order magnesium. Will also plan on extending patient's current steroid prescription. Patient has appointment with her doctor in 2 days.   ____________________________________________   FINAL CLINICAL IMPRESSION(S) / ED DIAGNOSES  Final diagnoses:  COPD exacerbation ([place])     Note: This dictation was prepared with  Sales executive. Any transcriptional errors that result from this process are unintentional     Nance Pear, MD 08/02/17 2051

## 2017-08-02 NOTE — ED Notes (Signed)
Pt states she has been using inhaler and nebulizer at home without relief. Speaking in complete sentences. No distress noted. Coughing during assessment, non-productive. Family at bedside.

## 2017-08-02 NOTE — ED Provider Notes (Signed)
Better, minimal wheezing at this time.  She is cleared for discharge.   Earleen Newport, MD 08/02/17 2235

## 2017-08-02 NOTE — Discharge Instructions (Signed)
Please seek medical attention for any high fevers, chest pain, shortness of breath, change in behavior, persistent vomiting, bloody stool or any other new or concerning symptoms.  

## 2017-08-02 NOTE — ED Triage Notes (Signed)
Short of breath and cough x 1 week. Took breathing treatment today without relief. Was at [facility] ER yesterday. Told if she did not improve to return. States this am woke up with same level of disomfort she had gone to ED for. Hx COPD.

## 2017-08-02 NOTE — ED Notes (Signed)
Patient reports she cannot tolerate nebulizer because it is causing her chest pain and she cannot breath. Neb stopped and edp notified of the same.

## 2017-08-04 ENCOUNTER — Encounter: Payer: Self-pay | Admitting: Family Medicine

## 2017-08-04 ENCOUNTER — Other Ambulatory Visit: Payer: Self-pay | Admitting: Gastroenterology

## 2017-08-04 ENCOUNTER — Ambulatory Visit (INDEPENDENT_AMBULATORY_CARE_PROVIDER_SITE_OTHER): Payer: Medicare Other | Admitting: Family Medicine

## 2017-08-04 VITALS — BP 92/60 | HR 87 | Temp 98.5°F | Resp 18 | Wt 154.0 lb

## 2017-08-04 DIAGNOSIS — J0100 Acute maxillary sinusitis, unspecified: Secondary | ICD-10-CM | POA: Diagnosis not present

## 2017-08-04 DIAGNOSIS — J441 Chronic obstructive pulmonary disease with (acute) exacerbation: Secondary | ICD-10-CM | POA: Diagnosis not present

## 2017-08-04 MED ORDER — AMOXICILLIN-POT CLAVULANATE 875-125 MG PO TABS: 1.0000 | ORAL_TABLET | Freq: Two times a day (BID) | ORAL | 0 refills | Status: AC

## 2017-08-11 ENCOUNTER — Telehealth: Payer: Self-pay | Admitting: *Deleted

## 2017-08-11 NOTE — Telephone Encounter (Signed)
Patient was seen 08/04/2017 for sinus infection. Patient states she completed antibiotic amoxicillin. However she is still having a stuffy head and tenderness around her eyes. Patient is requesting a different antibiotic. Also pt states since being on the antibiotic, she has had vaginal itching and burning. Patient is requesting a medication sent in for a yeast infection. Please advise?

## 2017-08-12 MED ORDER — AZITHROMYCIN 250 MG PO TABS: ORAL_TABLET | ORAL | 0 refills | Status: AC

## 2017-08-12 MED ORDER — FLUCONAZOLE 150 MG PO TABS: 150.0000 mg | ORAL_TABLET | Freq: Once | ORAL | 0 refills | Status: AC

## 2017-08-12 NOTE — Telephone Encounter (Signed)
Have sent prescriptions to [facility]

## 2017-08-12 NOTE — Telephone Encounter (Signed)
Pt called for status update. Pt stated she uses [facility]. Please advise. Thanks TNP

## 2017-08-12 NOTE — Telephone Encounter (Signed)
Patient was advised.  

## 2017-08-12 NOTE — Telephone Encounter (Signed)
Please advise 

## 2017-08-26 ENCOUNTER — Telehealth: Payer: Self-pay | Admitting: Internal Medicine

## 2017-08-26 NOTE — Telephone Encounter (Signed)
Ok thanks 

## 2017-08-26 NOTE — Telephone Encounter (Signed)
Patient states she was in and out of the ED due to various problems. She states that her blood pressures are running low. She decreased her flecainide to 100 mg and reports palpitations have returned. Instructed her to increase the flecainide back to the 150 mg twice daily and then to cut the metoprolol in 1/2 twice a day and see if that will help. She verbalized understanding of our conversation, agreement with plan, and had no further questions at this time.

## 2017-08-26 NOTE — Telephone Encounter (Signed)
Pt states she is having problems with her BP dropping Pt c/o BP issue: STAT if pt c/o blurred vision, one-sided weakness or slurred speech  1. What are your last 5 BP readings?  1st of June, when she went to PCP, 90/66 ED visit prior was just as low  2. Are you having any other symptoms (ex. Dizziness, headache, blurred vision, passed out)? Lightheaded and sleepiness   3. What is your BP issue? Low States she backed off the Metoprolol, and it came up some, still having some PVCs

## 2017-08-31 ENCOUNTER — Other Ambulatory Visit: Payer: Self-pay | Admitting: Family Medicine

## 2017-09-25 ENCOUNTER — Ambulatory Visit (INDEPENDENT_AMBULATORY_CARE_PROVIDER_SITE_OTHER): Payer: Medicare Other | Admitting: Internal Medicine

## 2017-09-25 ENCOUNTER — Encounter: Payer: Self-pay | Admitting: Internal Medicine

## 2017-09-25 VITALS — BP 124/78 | HR 85 | Ht 64.0 in | Wt 158.0 lb

## 2017-09-25 DIAGNOSIS — I493 Ventricular premature depolarization: Secondary | ICD-10-CM | POA: Diagnosis not present

## 2017-09-25 MED ORDER — PROPAFENONE HCL 225 MG PO TABS
ORAL_TABLET | ORAL | 6 refills | Status: DC
Start: 2017-09-25 — End: 2017-11-10

## 2017-09-25 NOTE — Progress Notes (Signed)
Patient Care Team: Birdie Sons, MD as PCP - General (Family Medicine) Laverle Hobby, MD as Consulting Physician (Pulmonary Disease) Lucilla Lame, MD as Consulting Physician (Gastroenterology)   HPI  Andrea [name] is a 52 y.o. female Seen in followup for symptomatic PVCs  5/19 started on flecainide  This has not been assoc with any significant improvement-- driving her crazy  She also has a history of COPD in the context of ongoing tobacco use  DATE TEST EF   11/15 LHC  No sign CAD  1/17 Myoview 68% No ischemia  7/18 Echo   60-65 %         Date Holter-PVCs  6/18 Cant find data   4/19 9%        Date Cr  K  3/19 0.96 3.6   6/19 0.72 4.0       Past Medical History:  Diagnosis Date  . Anxiety    panic attacks, chest pain  . Arthritis   . COPD (chronic obstructive pulmonary disease) ([place])   . GERD (gastroesophageal reflux disease)   . Hypertension    not medicated  . Non-obstructive CAD in native artery    a. cardiac cath 01/2014: showed minor irregularities, mildly elevated left ventricular end-diastolic pressure and normal ejection fraction; b. 03/2015 MV: low risk.  . Palpitations    a. 08/2016 Event Monitor: occas PVC's, two brief runs of SVT (4 and 7 beats).  Marland Kitchen PONV (postoperative nausea and vomiting)   . Symptomatic PVC's (premature ventricular contractions)   . Tobacco abuse   . Wears dentures    partial upper and lower    Past Surgical History:  Procedure Laterality Date  . CARDIAC CATHETERIZATION  01/03/14  . CARDIAC CATHETERIZATION    . ESOPHAGOGASTRODUODENOSCOPY (EGD) WITH PROPOFOL N/A 07/11/2016   Procedure: ESOPHAGOGASTRODUODENOSCOPY (EGD) WITH PROPOFOL;  Surgeon: Lucilla Lame, MD;  Location: [place];  Service: Endoscopy;  Laterality: N/A;  . EYE SURGERY    . IMAGE GUIDED SINUS SURGERY N/A 11/30/2014   Procedure: IMAGE GUIDED SINUS SURGERY;  Surgeon: Carloyn Manner, MD;  Location: [place];   Service: ENT;  Laterality: N/A;  GAVE DISK TO CE CE  . MAXILLARY ANTROSTOMY Bilateral 11/30/2014   Procedure: MAXILLARY ANTROSTOMY;  Surgeon: Carloyn Manner, MD;  Location: [place];  Service: ENT;  Laterality: Bilateral;  . SEPTOPLASTY N/A 11/30/2014   Procedure: SEPTOPLASTY;  Surgeon: Carloyn Manner, MD;  Location: [place];  Service: ENT;  Laterality: N/A;  . SPHENOIDECTOMY Left 11/30/2014   Procedure: Coralee Pesa, ;  Surgeon: Carloyn Manner, MD;  Location: [place];  Service: ENT;  Laterality: Left;  Marland Kitchen VAGINAL HYSTERECTOMY  1995   vaginal    Current Meds  Medication Sig  . albuterol (PROVENTIL HFA;VENTOLIN HFA) 108 (90 Base) MCG/ACT inhaler Inhale 2 puffs into the lungs every 6 (six) hours as needed for wheezing or shortness of breath.  Marland Kitchen albuterol (PROVENTIL) (2.5 MG/3ML) 0.083% nebulizer solution Take 3 mLs (2.5 mg total) by nebulization every 4 (four) hours as needed for wheezing or shortness of breath. Reported on 03/19/2015  . ALPRAZolam (XANAX) 1 MG tablet TAKE 1 TABLET BY MOUTH EVERY 8 HOURS AS NEEDED (Patient taking differently: TAKE 1 TABLET BY MOUTH EVERY 8 HOURS AS NEEDED SEVERE ANXIETY)  . ARIPiprazole (ABILIFY) 5 MG tablet Take 5 mg by mouth daily as needed (anxiety and depression).   . clonazePAM (KLONOPIN) 1 MG tablet TAKE 1 TABLET (1 MG TOTAL) BY  MOUTH 2 (TWO) TIMES DAILY AS NEEDED  . DEXILANT 60 MG capsule TAKE 1 CAPSULE BY MOUTH EVERY DAY  . flecainide (TAMBOCOR) 150 MG tablet Take 1 tablet (150 mg total) by mouth 2 (two) times daily.  . hydroxychloroquine (PLAQUENIL) 200 MG tablet Take 200 mg by mouth 2 (two) times daily.  . metoprolol tartrate (LOPRESSOR) 25 MG tablet Take 1 tablet (25 mg total) by mouth 2 (two) times daily. (Patient taking differently: Take 12.5 mg by mouth 2 (two) times daily. )  . nitroGLYCERIN (NITROSTAT) 0.4 MG SL tablet Place 1 tablet (0.4 mg total) under the tongue every 5 (five) minutes as needed for chest  pain.  Marland Kitchen Respiratory Therapy Supplies (FLUTTER) DEVI 1 Device by Does not apply route daily.  . sucralfate (CARAFATE) 1 g tablet TAKE 1 TABLET (1 G TOTAL) BY MOUTH 4 (FOUR) TIMES DAILY - WITH MEALS AND AT BEDTIME.  Marland Kitchen umeclidinium-vilanterol (ANORO ELLIPTA) 62.5-25 MCG/INH AEPB Inhale 1 puff into the lungs daily.    Allergies  Allergen Reactions  . Alum & Mag Hydroxide-Simeth Diarrhea and Nausea Only  . Cefdinir Other (See Comments)    tachycardia  . Chantix [Varenicline]     Bad dreams  . Doxycycline Other (See Comments)    Nausea, migraine  . Multaq [Dronedarone] Other (See Comments)    Lip/ mouth numbness/ tongue swelling  . Ivp Dye [Iodinated Diagnostic Agents] Palpitations      Review of Systems negative except from HPI and PMH  Physical Exam BP 124/78 (BP Location: Left Arm, Patient Position: Sitting, Cuff Size: Normal)   Pulse 85   Ht 5\' 4"  (1.626 m)   Wt 158 lb (71.7 kg)   BMI 27.12 kg/m  Well developed and nourished in no acute distress HENT normal Neck supple with JVP-flat Clear Regular rate and rhythm, no murmurs or gallops Abd-soft with active BS No Clubbing cyanosis edema Skin-warm and dry A & Oriented  Grossly normal sensory and motor function   ECG sinus without PVC  Assessment and  Plan  PVCs-frequent right bundle branch block inferior axis morphology with a delayed intrinsicoid deflection and some fragmentation.  QS aVR and aVL suggesting a near [place] origin  No obstructive coronary disease  Tobacco abuse  COPD  Anxiety   Her PVCs are very problematic, but interestingly on asculatation and ECG she has had none during eval  It may be that there has been suppression of PVCs but that they remain psychologically disruptive even still  Will try propafenone and check holter  Plan also to ask Dr Greggory Brandy to consider ablative therapy if objectively still at high volume  Her anxiety is a major portion of her palpitations    We spent more than  50% of our >25 min visit in face to face counseling regarding the above    Current medicines are reviewed at length with the patient today .  The patient does have concerns regarding medicines As above

## 2017-09-25 NOTE — Patient Instructions (Addendum)
Medication Instructions: - Your physician has recommended you make the following change in your medication:   1) STOP flecainide 2) START rhythmol (propafenone) 225 mg- take 1 tablet by mouth TWICE daily  Labwork: - none ordered  Procedures/Testing: - Your physician has recommended that you wear a 24 holter monitor- in 10-14 days. Holter monitors are medical devices that record the heart's electrical activity. Doctors most often use these monitors to diagnose arrhythmias. Arrhythmias are problems with the speed or rhythm of the heartbeat. The monitor is a small, portable device. You can wear one while you do your normal daily activities. This is usually used to diagnose what is causing palpitations/syncope (passing out).  Follow-Up: - You have been referred to Dr. Thompson Grayer for consideration of PVC ablation- our [facility] office will call you to schedule- you should see Dr. Rayann Heman after you wear your heart monitor.    Any Additional Special Instructions Will Be Listed Below (If Applicable).     If you need a refill on your cardiac medications before your next appointment, please call your pharmacy.

## 2017-09-29 ENCOUNTER — Other Ambulatory Visit: Payer: Self-pay | Admitting: Cardiovascular Disease

## 2017-09-29 ENCOUNTER — Telehealth: Payer: Self-pay | Admitting: Internal Medicine

## 2017-09-29 NOTE — Telephone Encounter (Signed)
Patient returning call.

## 2017-09-29 NOTE — Telephone Encounter (Signed)
No answer. Left message to call back.   

## 2017-09-29 NOTE — Telephone Encounter (Signed)
Please advise on which instructions I should refill.   Per medication list patient is to be taking 25MG  by mouth two times a day.  Patient states she had cut back to taking 12.5MG  twice a day due to blood pressure being low.   Pharmacy is requesting 25MG  three times a day.

## 2017-09-29 NOTE — Telephone Encounter (Signed)
Patient calling Needs medication clarification - pharmacy is stating the new medication prescribed will interact with others Please call to discuss

## 2017-09-30 ENCOUNTER — Ambulatory Visit (INDEPENDENT_AMBULATORY_CARE_PROVIDER_SITE_OTHER): Payer: Medicare Other

## 2017-09-30 DIAGNOSIS — I493 Ventricular premature depolarization: Secondary | ICD-10-CM | POA: Diagnosis not present

## 2017-09-30 NOTE — Telephone Encounter (Signed)
LMOV for patient to call back to verify exactly what dose and how she is taking medication.

## 2017-09-30 NOTE — Telephone Encounter (Signed)
I reviewed with Dr. Caryl Comes that the pharmacy had noted before that propafenone was contraindicated with regards to her COPD. Per Dr. Caryl Comes, he felt she may have minimal effects from propafenone in relationship to her COPD. He would like her to take this if possible to see how she does. The other alternative would be amiodarone.  The hope is that she may be a candidate for PVC ablation in the near future.  I have called and spoken with the patient and she is aware of Dr. Olin Pia recommendations.  She is agreeable with trying propafenone. She states the pharmacy is waiting on a call back from our office to give the ok. I advised I will call them and that I have also sent a message to Dr. Jackalyn Lombard scheduler to check on the status of her appointment. I have not heard back yet.   I have spoken with Rich at the pharmacy and he is aware that Dr. Caryl Comes is ok with the patient starting the propafenone. Rich voices understanding.

## 2017-09-30 NOTE — Telephone Encounter (Signed)
If the patient confirms she is taking metoprolol tartrate 25 mg- 1/2 tablet (12.5 mg) twice daily, ok to refill like that. Metoprolol tartrate is never really given three times a day.

## 2017-10-03 ENCOUNTER — Ambulatory Visit
Admission: RE | Admit: 2017-10-03 | Discharge: 2017-10-03 | Disposition: A | Discharge status: Home / Self Care | Payer: Medicare Other | Source: Ambulatory Visit | Attending: Internal Medicine | Admitting: Internal Medicine

## 2017-10-03 DIAGNOSIS — I493 Ventricular premature depolarization: Secondary | ICD-10-CM | POA: Diagnosis not present

## 2017-10-16 ENCOUNTER — Ambulatory Visit: Payer: Medicare Other | Admitting: Internal Medicine

## 2017-10-20 ENCOUNTER — Telehealth: Payer: Self-pay | Admitting: Internal Medicine

## 2017-10-20 NOTE — Telephone Encounter (Signed)
Patient calling to check on status of monitor results Please call to discuss  

## 2017-10-20 NOTE — Telephone Encounter (Signed)
Routing to Dr Caryl Comes and Nira Conn to review results when able.   MyChart Message sent to patient to let her know I have notified Dr Caryl Comes to review results.

## 2017-10-21 NOTE — Telephone Encounter (Signed)
My chart message sent to the patient stating that Dr. Caryl Comes reviewed her holter monitor and that she is still having about 10% PVC's. I advised her that Dr. Caryl Comes recommends that she keep her follow up with Dr. Rayann Heman.

## 2017-10-23 ENCOUNTER — Ambulatory Visit: Payer: Medicare Other | Admitting: Internal Medicine

## 2017-10-26 ENCOUNTER — Other Ambulatory Visit: Payer: Self-pay | Admitting: Family Medicine

## 2017-10-27 ENCOUNTER — Institutional Professional Consult (permissible substitution): Payer: Medicare Other | Admitting: Internal Medicine

## 2017-11-10 ENCOUNTER — Ambulatory Visit (INDEPENDENT_AMBULATORY_CARE_PROVIDER_SITE_OTHER): Payer: Medicare Other | Admitting: Internal Medicine

## 2017-11-10 ENCOUNTER — Encounter: Payer: Self-pay | Admitting: Internal Medicine

## 2017-11-10 VITALS — BP 118/80 | HR 84 | Ht 63.0 in | Wt 159.2 lb

## 2017-11-10 DIAGNOSIS — I493 Ventricular premature depolarization: Secondary | ICD-10-CM

## 2017-11-10 DIAGNOSIS — Z720 Tobacco use: Secondary | ICD-10-CM | POA: Diagnosis not present

## 2017-11-10 DIAGNOSIS — R0602 Shortness of breath: Secondary | ICD-10-CM

## 2017-11-10 NOTE — Progress Notes (Signed)
Electrophysiology Office Note   Date:  11/10/2017   ID:  Seaira, Byus 1965-11-14, MRN [id number]  PCP:  Birdie Sons, MD  Primary Electrophysiologist: Dr Caryl Comes   CC: PVCs   History of Present Illness: Andrea [name] is a 52 y.o. female who presents today for electrophysiology evaluation.   She has had PVCs for about 3 years.  Episodes have increased in frequency and duration.  She finds that they are worse when eating or with exercise.  She has failed medical therapy with flecainide, multaq, and rhythmol.  HOlter 8/19 (reviewed) reveals 10% PVC burden.  She smokes.  Interestingly, she is afraid to take beta blockers due to concerns for "SOB" but does not seem as concerned about smoking heavily... With her PVCs she has palpitations.  She has associated SOB and fatigue.  She feels "washed out" afterwards. Today, she denies symptoms of chest pain, shortness of breath, orthopnea, PND, lower extremity edema, claudication, dizziness, presyncope, syncope, bleeding, or neurologic sequela. The patient is tolerating medications without difficulties and is otherwise without complaint today.    Past Medical History:  Diagnosis Date  . Anxiety    panic attacks, chest pain  . Arthritis   . COPD (chronic obstructive pulmonary disease) ([place])   . GERD (gastroesophageal reflux disease)   . Hypertension    not medicated  . Non-obstructive CAD in native artery    a. cardiac cath 01/2014: showed minor irregularities, mildly elevated left ventricular end-diastolic pressure and normal ejection fraction; b. 03/2015 MV: low risk.  . Palpitations    a. 08/2016 Event Monitor: occas PVC's, two brief runs of SVT (4 and 7 beats).  Marland Kitchen PONV (postoperative nausea and vomiting)   . Symptomatic PVC's (premature ventricular contractions)   . Tobacco abuse   . Wears dentures    partial upper and lower   Past Surgical History:  Procedure Laterality Date  . CARDIAC CATHETERIZATION  01/03/14  . CARDIAC  CATHETERIZATION    . ESOPHAGOGASTRODUODENOSCOPY (EGD) WITH PROPOFOL N/A 07/11/2016   Procedure: ESOPHAGOGASTRODUODENOSCOPY (EGD) WITH PROPOFOL;  Surgeon: Lucilla Lame, MD;  Location: [facility];  Service: Endoscopy;  Laterality: N/A;  . EYE SURGERY    . IMAGE GUIDED SINUS SURGERY N/A 11/30/2014   Procedure: IMAGE GUIDED SINUS SURGERY;  Surgeon: Carloyn Manner, MD;  Location: [facility];  Service: ENT;  Laterality: N/A;  GAVE DISK TO CE CE  . MAXILLARY ANTROSTOMY Bilateral 11/30/2014   Procedure: MAXILLARY ANTROSTOMY;  Surgeon: Carloyn Manner, MD;  Location: [facility];  Service: ENT;  Laterality: Bilateral;  . SEPTOPLASTY N/A 11/30/2014   Procedure: SEPTOPLASTY;  Surgeon: Carloyn Manner, MD;  Location: [facility];  Service: ENT;  Laterality: N/A;  . SPHENOIDECTOMY Left 11/30/2014   Procedure: Coralee Pesa, ;  Surgeon: Carloyn Manner, MD;  Location: [facility];  Service: ENT;  Laterality: Left;  Marland Kitchen VAGINAL HYSTERECTOMY  1995   vaginal     Current Outpatient Medications  Medication Sig Dispense Refill  . albuterol (PROVENTIL HFA;VENTOLIN HFA) 108 (90 Base) MCG/ACT inhaler Inhale 2 puffs into the lungs every 6 (six) hours as needed for wheezing or shortness of breath. 1 Inhaler 3  . albuterol (PROVENTIL) (2.5 MG/3ML) 0.083% nebulizer solution Take 3 mLs (2.5 mg total) by nebulization every 4 (four) hours as needed for wheezing or shortness of breath. Reported on 03/19/2015 75 mL 12  . ALPRAZolam (XANAX) 1 MG tablet Take 1 mg by mouth as needed for anxiety.    Marland Kitchen  ARIPiprazole (ABILIFY) 5 MG tablet Take 5 mg by mouth daily as needed (anxiety and depression).     . clonazePAM (KLONOPIN) 1 MG tablet TAKE 1 TABLET (1 MG TOTAL) BY MOUTH 2 (TWO) TIMES DAILY AS NEEDED 90 tablet 3  . DEXILANT 60 MG capsule TAKE 1 CAPSULE BY MOUTH EVERY DAY 30 capsule 5  . hydroxychloroquine (PLAQUENIL) 200 MG tablet TAKE 2 TABLETS BY MOUTH EVERY DAY 180 tablet 1  .  nitroGLYCERIN (NITROSTAT) 0.4 MG SL tablet Place 1 tablet (0.4 mg total) under the tongue every 5 (five) minutes as needed for chest pain. 25 tablet 0  . Respiratory Therapy Supplies (FLUTTER) DEVI 1 Device by Does not apply route daily. 1 each 0  . sucralfate (CARAFATE) 1 g tablet TAKE 1 TABLET (1 G TOTAL) BY MOUTH 4 (FOUR) TIMES DAILY - WITH MEALS AND AT BEDTIME. 28 tablet 11  . umeclidinium-vilanterol (ANORO ELLIPTA) 62.5-25 MCG/INH AEPB Inhale 1 puff into the lungs daily. 60 each 5   No current facility-administered medications for this visit.     Allergies:   Alum & mag hydroxide-simeth; Cefdinir; Chantix [varenicline]; Doxycycline; Multaq [dronedarone]; and Ivp dye [iodinated diagnostic agents]   Social History:  The patient  reports that she has been smoking cigarettes. She has a 8.00 pack-year smoking history. She has never used smokeless tobacco. She reports that she does not drink alcohol or use drugs.   Family History:  The patient's  family history includes Arthritis in her father; Asthma in her father; Colon cancer in her father; Emphysema in her father; Heart attack in her father; Heart disease in her father; Hypertension in her mother.    ROS:  Please see the history of present illness.   All other systems are personally reviewed and negative.    PHYSICAL EXAM: VS:  BP 118/80   Pulse 84   Ht 5\' 3"  (1.6 m)   Wt 159 lb 3.2 oz (72.2 kg)   SpO2 99%   BMI 28.20 kg/m  , BMI Body mass index is 28.2 kg/m. GEN: Well nourished, well developed, in no acute distress  HEENT: normal  Neck: no JVD, carotid bruits, or masses Cardiac: RRR; no murmurs, rubs, or gallops,no edema  Respiratory:  clear to auscultation bilaterally, normal work of breathing GI: soft, nontender, nondistended, + BS MS: no deformity or atrophy  Skin: warm and dry  Neuro:  Strength and sensation are intact Psych: euthymic mood, full affect  EKG:  EKG is ordered today. The ekg ordered today is personally  reviewed and shows sinus rhythm 84 bpm, PR 156 msece, QRS 72 msec, QTc 418 msec, nonspecific ST/T changes   Recent Labs: 08/02/2017: ALT 16; BUN 18; Creatinine, Ser 0.72; Hemoglobin 14.2; Magnesium 1.7; Platelets 260; Potassium 4.0; Sodium 134  personally reviewed   Lipid Panel     Component Value Date/Time   CHOL 193 06/11/2016 1504   TRIG 159 (H) 06/11/2016 1504   HDL 40 06/11/2016 1504   CHOLHDL 4.8 (H) 06/11/2016 1504   CHOLHDL 5.7 03/19/2015 0812   VLDL 20 03/19/2015 0812   LDLCALC 121 (H) 06/11/2016 1504   personally reviewed   Wt Readings from Last 3 Encounters:  11/10/17 159 lb 3.2 oz (72.2 kg)  09/25/17 158 lb (71.7 kg)  08/04/17 154 lb (69.9 kg)      Other studies personally reviewed: Additional studies/ records that were reviewed today include: Dr Aquilla Hacker notes, prior holter,   Review of the above records today demonstrates: echo 09/25/16 reveals  EF 60%, RV function normal, normal study   ASSESSMENT AND PLAN:  1.  PVCs Appear to be coming from the LV.  I have reviewed all of her EKGs in epic over the past year.  She seems to infrequently have PVCs in clinic, though holter suggests over all burden of 10%.  I am not optimistic that she would have them upon presentation to EP lab.  I therefore think that anticipated success with ablation would be low. Today, we discussed lifestyle modification including smoking cessation, regular exercise, and stress avoidance.  I did offer verapamil, mexiletine, and ranexa as options.  She is not currently interested in additional medicines.  She is also not very excited about ablation given the fact that she may not have them and therefore may not have successful ablation.  2. Smoking cessation As above   Follow-up:  Dr Caryl Comes  Current medicines are reviewed at length with the patient today.   The patient does not have concerns regarding her medicines.  The following changes were made today:  none  Labs/ tests ordered today include:   Orders Placed This Encounter  Procedures  . EKG 12-Lead     Signed, Thompson Grayer, MD  11/10/2017 9:35 AM     [facility] [address] [place] [address] [phone] (office) [phone] (fax)

## 2017-11-10 NOTE — Patient Instructions (Addendum)
Medication Instructions:  Your physician recommends that you continue on your current medications as directed. Please refer to the Current Medication list given to you today.  Labwork: None ordered.  Testing/Procedures: None ordered.  Follow-Up: Your physician wants you to follow-up in: 6 weeks with Dr. Caryl Comes.      Any Other Special Instructions Will Be Listed Below (If Applicable).  If you need a refill on your cardiac medications before your next appointment, please call your pharmacy.    Steps to Quit Smoking Smoking tobacco can be harmful to your health and can affect almost every organ in your body. Smoking puts you, and those around you, at risk for developing many serious chronic diseases. Quitting smoking is difficult, but it is one of the best things that you can do for your health. It is never too late to quit. What are the benefits of quitting smoking? When you quit smoking, you lower your risk of developing serious diseases and conditions, such as:  Lung cancer or lung disease, such as COPD.  Heart disease.  Stroke.  Heart attack.  Infertility.  Osteoporosis and bone fractures.  Additionally, symptoms such as coughing, wheezing, and shortness of breath may get better when you quit. You may also find that you get sick less often because your body is stronger at fighting off colds and infections. If you are pregnant, quitting smoking can help to reduce your chances of having a baby of low birth weight. How do I get ready to quit? When you decide to quit smoking, create a plan to make sure that you are successful. Before you quit:  Pick a date to quit. Set a date within the next two weeks to give you time to prepare.  Write down the reasons why you are quitting. Keep this list in places where you will see it often, such as on your bathroom mirror or in your car or wallet.  Identify the people, places, things, and activities that make you want to smoke (triggers)  and avoid them. Make sure to take these actions: ? Throw away all cigarettes at home, at work, and in your car. ? Throw away smoking accessories, such as Scientist, research (medical). ? Clean your car and make sure to empty the ashtray. ? Clean your home, including curtains and carpets.  Tell your family, friends, and coworkers that you are quitting. Support from your loved ones can make quitting easier.  Talk with your health care provider about your options for quitting smoking.  Find out what treatment options are covered by your health insurance.  What strategies can I use to quit smoking? Talk with your healthcare provider about different strategies to quit smoking. Some strategies include:  Quitting smoking altogether instead of gradually lessening how much you smoke over a period of time. Research shows that quitting "cold [place]" is more successful than gradually quitting.  Attending in-person counseling to help you build problem-solving skills. You are more likely to have success in quitting if you attend several counseling sessions. Even short sessions of 10 minutes can be effective.  Finding resources and support systems that can help you to quit smoking and remain smoke-free after you quit. These resources are most helpful when you use them often. They can include: ? Online chats with a Social worker. ? Telephone quitlines. ? Careers information officer. ? Support groups or group counseling. ? Text messaging programs. ? Mobile phone applications.  Taking medicines to help you quit smoking. (If you are pregnant or breastfeeding,  talk with your health care provider first.) Some medicines contain nicotine and some do not. Both types of medicines help with cravings, but the medicines that include nicotine help to relieve withdrawal symptoms. Your health care provider may recommend: ? Nicotine patches, gum, or lozenges. ? Nicotine inhalers or sprays. ? Non-nicotine medicine that is taken by  mouth.  Talk with your health care provider about combining strategies, such as taking medicines while you are also receiving in-person counseling. Using these two strategies together makes you more likely to succeed in quitting than if you used either strategy on its own. If you are pregnant or breastfeeding, talk with your health care provider about finding counseling or other support strategies to quit smoking. Do not take medicine to help you quit smoking unless told to do so by your health care provider. What things can I do to make it easier to quit? Quitting smoking might feel overwhelming at first, but there is a lot that you can do to make it easier. Take these important actions:  Reach out to your family and friends and ask that they support and encourage you during this time. Call telephone quitlines, reach out to support groups, or work with a counselor for support.  Ask people who smoke to avoid smoking around you.  Avoid places that trigger you to smoke, such as bars, parties, or smoke-break areas at work.  Spend time around people who do not smoke.  Lessen stress in your life, because stress can be a smoking trigger for some people. To lessen stress, try: ? Exercising regularly. ? Deep-breathing exercises. ? Yoga. ? Meditating. ? Performing a body scan. This involves closing your eyes, scanning your body from head to toe, and noticing which parts of your body are particularly tense. Purposefully relax the muscles in those areas.  Download or purchase mobile phone or tablet apps (applications) that can help you stick to your quit plan by providing reminders, tips, and encouragement. There are many free apps, such as QuitGuide from the [employer] Office manager for Disease Control and Prevention). You can find other support for quitting smoking (smoking cessation) through smokefree.gov and other websites.  How will I feel when I quit smoking? Within the first 24 hours of quitting smoking,  you may start to feel some withdrawal symptoms. These symptoms are usually most noticeable 2-3 days after quitting, but they usually do not last beyond 2-3 weeks. Changes or symptoms that you might experience include:  Mood swings.  Restlessness, anxiety, or irritation.  Difficulty concentrating.  Dizziness.  Strong cravings for sugary foods in addition to nicotine.  Mild weight gain.  Constipation.  Nausea.  Coughing or a sore throat.  Changes in how your medicines work in your body.  A depressed mood.  Difficulty sleeping (insomnia).  After the first 2-3 weeks of quitting, you may start to notice more positive results, such as:  Improved sense of smell and taste.  Decreased coughing and sore throat.  Slower heart rate.  Lower blood pressure.  Clearer skin.  The ability to breathe more easily.  Fewer sick days.  Quitting smoking is very challenging for most people. Do not get discouraged if you are not successful the first time. Some people need to make many attempts to quit before they achieve long-term success. Do your best to stick to your quit plan, and talk with your health care provider if you have any questions or concerns. This information is not intended to replace advice given to you by  your health care provider. Make sure you discuss any questions you have with your health care provider. Document Released: 02/12/2001 Document Revised: 10/17/2015 Document Reviewed: 07/05/2014 Elsevier Interactive Patient Education  [employer].

## 2017-11-13 ENCOUNTER — Other Ambulatory Visit: Payer: Self-pay | Admitting: Family Medicine

## 2017-12-29 ENCOUNTER — Ambulatory Visit: Payer: Medicare Other | Admitting: Internal Medicine

## 2017-12-29 NOTE — Progress Notes (Deleted)
  [facility] Note     Assessment and Plan:  The patient is a 53 year old smoker with moderate COPD, and multiple COPD exacerbations in the past. Patient also has a significant anxiety component.  Tobacco abuse. --Discussed importance of smoking cessation today. Greater than 3 minutes spent in discussion.   COPD, group D with multiple exacerbations. --Continue Anoro once daily.  Anxiety. --I suspect this may be playing a role in her dyspnea in addition to emphysema.  -Continue current treatment.  Pulmonary nodule. 06/2013 CT high res chest> mild centrilobular emphysema but no ILD, multiple scattered pulm nodules all 11mm in size 07/2014 CT chest > nodule unchanged no further follow up recommended.   Daytime somnolence 05/2013 Sleep study [facility] > AHI 3.8  GERD. -Continue Dexilant, continue follow-up with gastroenterology.   Palpitations. -Patient was noted to have palpitations previously, thought to be secondary to SVT with noncompliance with her medications. - She was advised to take her medications regularly.    No orders of the defined types were placed in this encounter.    HPI:  The patient is a 52 year old female with severe COPD, complicated by anxiety.  She is smoking less than a ppd.She has GERD, she  is currently taking Dexilant.   **Review of tracings personally, 06/18/16 PFT: FVC is 82%, FEV1 is a 66% without significant reversibility, ratio is 60%. Lung volumes are unremarkable. Diffusion capacity is 57% of predicted. Flow volume loop is consistent with obstruction. Overall, this testing is consistent with moderate objective lung disease with an FEV1 of 60%.  **Chest x-ray  06/19/14 ; showed emphysematous changes, otherwise unremarkable with hyperinflated lungs, minimal difference in comparison with previous films.  CT chest from 06/10/2013 was reviewed that showed some scattered emphysematous changes but otherwise unremarkable without evidence  of bronchiectasis.  Desat walk 07/04/2016 Baseline O2 was 96% and HR 87 at rest on RA. After walking 600 feet sat was 94% and HR 98 on RA. Pt complained of mild dyspnea, but appeared comfortable and was speaking full sentences.   Medication:    Reviewed   Allergies:  Alum & mag hydroxide-simeth; Cefdinir; Chantix [varenicline]; Doxycycline; Multaq [dronedarone]; and Ivp dye [iodinated diagnostic agents]        Thank  you for the consultation and for allowing [facility] to assist in the care of your patient. Our recommendations are noted above.  Please contact us if we can be of further service.

## 2017-12-30 ENCOUNTER — Encounter: Payer: Self-pay | Admitting: Internal Medicine

## 2018-01-06 ENCOUNTER — Telehealth: Payer: Self-pay | Admitting: Family Medicine

## 2018-01-06 NOTE — Telephone Encounter (Signed)
I called the patient to schedule AWV-I with McKenzie. She said that she will call back once she sees what other appointments she has coming up. VDM (DD)

## 2018-01-22 ENCOUNTER — Other Ambulatory Visit: Payer: Self-pay | Admitting: Gastroenterology

## 2018-01-28 NOTE — Telephone Encounter (Signed)
Pt needs refill on Dexilent. Says [facility] sent request a wk ago. pls call patient

## 2018-02-13 ENCOUNTER — Other Ambulatory Visit: Payer: Self-pay

## 2018-02-13 ENCOUNTER — Ambulatory Visit: Payer: Medicare Other | Admitting: Physician Assistant

## 2018-02-13 ENCOUNTER — Emergency Department
Admission: EM | Admit: 2018-02-13 | Discharge: 2018-02-13 | Disposition: A | Discharge status: Home / Self Care | Payer: Medicare Other | Attending: Emergency Medicine | Admitting: Emergency Medicine

## 2018-02-13 ENCOUNTER — Encounter: Payer: Self-pay | Admitting: Emergency Medicine

## 2018-02-13 ENCOUNTER — Emergency Department: Payer: Medicare Other

## 2018-02-13 DIAGNOSIS — R0789 Other chest pain: Secondary | ICD-10-CM | POA: Diagnosis not present

## 2018-02-13 DIAGNOSIS — R05 Cough: Secondary | ICD-10-CM | POA: Diagnosis not present

## 2018-02-13 DIAGNOSIS — R079 Chest pain, unspecified: Secondary | ICD-10-CM | POA: Diagnosis not present

## 2018-02-13 DIAGNOSIS — Z720 Tobacco use: Secondary | ICD-10-CM

## 2018-02-13 DIAGNOSIS — R059 Cough, unspecified: Secondary | ICD-10-CM

## 2018-02-13 DIAGNOSIS — F17210 Nicotine dependence, cigarettes, uncomplicated: Secondary | ICD-10-CM | POA: Insufficient documentation

## 2018-02-13 DIAGNOSIS — Z79899 Other long term (current) drug therapy: Secondary | ICD-10-CM | POA: Diagnosis not present

## 2018-02-13 DIAGNOSIS — I10 Essential (primary) hypertension: Secondary | ICD-10-CM | POA: Diagnosis not present

## 2018-02-13 DIAGNOSIS — J449 Chronic obstructive pulmonary disease, unspecified: Secondary | ICD-10-CM | POA: Diagnosis not present

## 2018-02-13 LAB — CBC WITH DIFFERENTIAL/PLATELET
ABS IMMATURE GRANULOCYTES: 0.03 10*3/uL (ref 0.00–0.07)
BASOS ABS: 0.1 10*3/uL (ref 0.0–0.1)
Basophils Relative: 1 %
Eosinophils Absolute: 0.1 10*3/uL (ref 0.0–0.5)
Eosinophils Relative: 1 %
HCT: 40.3 % (ref 36.0–46.0)
HEMOGLOBIN: 13.2 g/dL (ref 12.0–15.0)
Immature Granulocytes: 0 %
LYMPHS PCT: 22 %
Lymphs Abs: 1.9 10*3/uL (ref 0.7–4.0)
MCH: 26.5 pg (ref 26.0–34.0)
MCHC: 32.8 g/dL (ref 30.0–36.0)
MCV: 80.9 fL (ref 80.0–100.0)
MONO ABS: 0.9 10*3/uL (ref 0.1–1.0)
Monocytes Relative: 10 %
NEUTROS ABS: 5.8 10*3/uL (ref 1.7–7.7)
NEUTROS PCT: 66 %
NRBC: 0 % (ref 0.0–0.2)
Platelets: 255 10*3/uL (ref 150–400)
RBC: 4.98 MIL/uL (ref 3.87–5.11)
RDW: 15.2 % (ref 11.5–15.5)
WBC: 8.8 10*3/uL (ref 4.0–10.5)

## 2018-02-13 LAB — BASIC METABOLIC PANEL
ANION GAP: 9 (ref 5–15)
BUN: 13 mg/dL (ref 6–20)
CHLORIDE: 101 mmol/L (ref 98–111)
CO2: 25 mmol/L (ref 22–32)
Calcium: 8.8 mg/dL — ABNORMAL LOW (ref 8.9–10.3)
Creatinine, Ser: 0.72 mg/dL (ref 0.44–1.00)
GFR calc non Af Amer: >60 mL/min (ref >60)
Glucose, Bld: 94 mg/dL (ref 70–99)
POTASSIUM: 4 mmol/L (ref 3.5–5.1)
SODIUM: 135 mmol/L (ref 135–145)

## 2018-02-13 LAB — TROPONIN I

## 2018-02-13 MED ORDER — PREDNISONE 20 MG PO TABS: 60.0000 mg | ORAL_TABLET | Freq: Every day | ORAL | 0 refills | Status: DC

## 2018-02-13 MED ORDER — PREDNISONE 20 MG PO TABS
60.0000 mg | ORAL_TABLET | Freq: Once | ORAL | Status: AC
  Administered 2018-02-13: 60 mg via ORAL
  Filled 2018-02-13: qty 3

## 2018-02-13 MED ORDER — IPRATROPIUM-ALBUTEROL 0.5-2.5 (3) MG/3ML IN SOLN
3.0000 mL | Freq: Once | RESPIRATORY_TRACT | Status: AC
  Administered 2018-02-13: 3 mL via RESPIRATORY_TRACT
  Filled 2018-02-13: qty 3

## 2018-02-13 NOTE — ED Notes (Signed)
Return from xray, trop drawn and sent/ IV hep lock placed.

## 2018-02-13 NOTE — ED Notes (Signed)
Repeat trop drawn and sent

## 2018-02-13 NOTE — ED Notes (Signed)
EKG completed in triage.

## 2018-02-13 NOTE — ED Notes (Signed)
Patient off unit to xray

## 2018-02-13 NOTE — ED Provider Notes (Addendum)
[facility] Emergency Department Provider Note  ____________________________________________   I have reviewed the triage vital signs and the nursing notes. Where available I have reviewed prior notes and, if possible and indicated, outside hospital notes.    HISTORY  Chief Complaint Chest Pain    HPI Andrea [name] is a 52 y.o. female  With a history of fibromyalgia, reflux disease panic attacks, panic attacks with chest pain, negative cardiac catheterization in 2015 presents today with a cough which caused her to have pain in her chest.  She has no history in herself or family PE or DVT.  She states she had a good cough this morning as she describes it, and if she found to be uncomfortable.  She states that she also had a twitch in her jaw muscle which is stopped.  She denies any fever or chills.  She does smoke cigarettes a pack a day, she does have a history of COPD, she feels that her breathing is better than it was before she started coughing and she does not feel "too sick" from her breathing.  She was concerned however that when she coughed it was uncomfortable in her chest wall.  At this time she has no pain.  While the pain was lasting this morning, it hurts to touch in her when she changed position but now seems to have "calmed down".  No radiation of the pain. No recent travel no leg swelling not on estrogens   Past Medical History:  Diagnosis Date  . Anxiety    panic attacks, chest pain  . Arthritis   . COPD (chronic obstructive pulmonary disease) ([place])   . GERD (gastroesophageal reflux disease)   . Hypertension    not medicated  . Non-obstructive CAD in native artery    a. cardiac cath 01/2014: showed minor irregularities, mildly elevated left ventricular end-diastolic pressure and normal ejection fraction; b. 03/2015 MV: low risk.  . Palpitations    a. 08/2016 Event Monitor: occas PVC's, two brief runs of SVT (4 and 7 beats).  Marland Kitchen PONV  (postoperative nausea and vomiting)   . Symptomatic PVC's (premature ventricular contractions)   . Tobacco abuse   . Wears dentures    partial upper and lower    Patient Active Problem List   Diagnosis Date Noted  . Varicose veins of bilateral lower extremities with pain 11/22/2016  . Restless leg 11/18/2016  . Iron deficiency 11/18/2016  . Allergic rhinitis 06/11/2016  . Hyperlipidemia 03/19/2015  . COPD exacerbation ([place]) 10/04/2014  . Anxiety 09/22/2014  . Depression 09/22/2014  . Dyshidrosis 09/22/2014  . GERD (gastroesophageal reflux disease) 09/22/2014  . Insomnia 09/22/2014  . Lung nodule, multiple 09/22/2014  . Panic disorder 09/22/2014  . Palpitations 01/17/2014  . Pulmonary nodule 06/10/2013  . Shortness of breath 05/20/2013  . COPD, GOLD B 05/20/2013  . Tobacco abuse 05/20/2013  . Daytime somnolence 05/20/2013  . Back pain 08/28/2012  . Discoid lupus 05/30/2012  . Cellulitis and abscess of face 05/30/2012  . Generalized abdominal pain 03/11/2012  . Pain in joint 03/09/2012  . Psoriasis 02/21/2012  . Fibromyalgia 02/11/2012  . Lupus ([place]) 02/11/2012  . Panic attacks 02/11/2012  . History of adenomatous polyp of colon 09/13/2011  . Heartburn 08/06/2011  . PVC's (premature ventricular contractions) 01/29/2011  . Essential (primary) hypertension 03/04/1998    Past Surgical History:  Procedure Laterality Date  . CARDIAC CATHETERIZATION  01/03/14  . CARDIAC CATHETERIZATION    . ESOPHAGOGASTRODUODENOSCOPY (EGD) WITH PROPOFOL  N/A 07/11/2016   Procedure: ESOPHAGOGASTRODUODENOSCOPY (EGD) WITH PROPOFOL;  Surgeon: Lucilla Lame, MD;  Location: [place];  Service: Endoscopy;  Laterality: N/A;  . EYE SURGERY    . IMAGE GUIDED SINUS SURGERY N/A 11/30/2014   Procedure: IMAGE GUIDED SINUS SURGERY;  Surgeon: Carloyn Manner, MD;  Location: [place];  Service: ENT;  Laterality: N/A;  GAVE DISK TO CE CE  . MAXILLARY ANTROSTOMY Bilateral 11/30/2014    Procedure: MAXILLARY ANTROSTOMY;  Surgeon: Carloyn Manner, MD;  Location: [place];  Service: ENT;  Laterality: Bilateral;  . SEPTOPLASTY N/A 11/30/2014   Procedure: SEPTOPLASTY;  Surgeon: Carloyn Manner, MD;  Location: [place];  Service: ENT;  Laterality: N/A;  . SPHENOIDECTOMY Left 11/30/2014   Procedure: Coralee Pesa, ;  Surgeon: Carloyn Manner, MD;  Location: [place];  Service: ENT;  Laterality: Left;  Marland Kitchen VAGINAL HYSTERECTOMY  1995   vaginal    Prior to Admission medications   Medication Sig Start Date End Date Taking? Authorizing Provider  albuterol (PROVENTIL HFA;VENTOLIN HFA) 108 (90 Base) MCG/ACT inhaler Inhale 2 puffs into the lungs every 6 (six) hours as needed for wheezing or shortness of breath. 06/11/16   Birdie Sons, MD  albuterol (PROVENTIL) (2.5 MG/3ML) 0.083% nebulizer solution Take 3 mLs (2.5 mg total) by nebulization every 4 (four) hours as needed for wheezing or shortness of breath. Reported on 03/19/2015 03/19/15   Theodoro Grist, MD  ALPRAZolam Duanne Moron) 1 MG tablet TAKE 1 TABLET BY MOUTH EVERY 8 HOURS AS NEEDED 11/13/17   Birdie Sons, MD  ARIPiprazole (ABILIFY) 5 MG tablet Take 5 mg by mouth daily as needed (anxiety and depression).     [provider]  clonazePAM (KLONOPIN) 1 MG tablet TAKE 1 TABLET (1 MG TOTAL) BY MOUTH 2 (TWO) TIMES DAILY AS NEEDED 09/01/17   Birdie Sons, MD  DEXILANT 60 MG capsule TAKE 1 CAPSULE BY MOUTH EVERY DAY 02/02/18   Lucilla Lame, MD  hydroxychloroquine (PLAQUENIL) 200 MG tablet TAKE 2 TABLETS BY MOUTH EVERY DAY 10/26/17   Birdie Sons, MD  nitroGLYCERIN (NITROSTAT) 0.4 MG SL tablet Place 1 tablet (0.4 mg total) under the tongue every 5 (five) minutes as needed for chest pain. 12/08/14   Wellington Hampshire, MD  Respiratory Therapy Supplies (FLUTTER) DEVI 1 Device by Does not apply route daily. 03/19/15   Theodoro Grist, MD  sucralfate (CARAFATE) 1 g tablet TAKE 1 TABLET (1 G TOTAL) BY MOUTH  4 (FOUR) TIMES DAILY - WITH MEALS AND AT BEDTIME. 11/11/16   Birdie Sons, MD  umeclidinium-vilanterol (ANORO ELLIPTA) 62.5-25 MCG/INH AEPB Inhale 1 puff into the lungs daily. 07/04/16   Laverle Hobby, MD    Allergies Alum & mag hydroxide-simeth; Cefdinir; Chantix [varenicline]; Doxycycline; Multaq [dronedarone]; and Ivp dye [iodinated diagnostic agents]  Family History  Problem Relation Age of Onset  . Emphysema Father   . Asthma Father   . Heart disease Father   . Heart attack Father   . Arthritis Father        RA  . Colon cancer Father   . Hypertension Mother   . Breast cancer Neg Hx     Social History Social History   Tobacco Use  . Smoking status: Current Every Day Smoker    Packs/day: 0.50    Years: 16.00    Pack years: 8.00    Types: Cigarettes  . Smokeless tobacco: Never Used  . Tobacco comment: started smoking 52yo up to 1 ppd  Substance Use Topics  . Alcohol use: No    Alcohol/week: 0.0 standard drinks  . Drug use: No    Review of Systems Constitutional: No fever/chills Eyes: No visual changes. ENT: No sore throat. No stiff neck no neck pain Cardiovascular: See HPI regarding chest pain. Respiratory: Denies shortness of breath.  Positive cough Gastrointestinal:   no vomiting.  No diarrhea.  No constipation. Genitourinary: Negative for dysuria. Musculoskeletal: Negative lower extremity swelling Skin: Negative for rash. Neurological: Negative for severe headaches, focal weakness or numbness.   ____________________________________________   PHYSICAL EXAM:  VITAL SIGNS: ED Triage Vitals  Enc Vitals Group     BP 02/13/18 1139 125/78     Pulse Rate 02/13/18 1139 94     Resp 02/13/18 1139 18     Temp 02/13/18 1139 98.4 F (36.9 C)     Temp Source 02/13/18 1139 Oral     SpO2 02/13/18 1139 96 %     Weight 02/13/18 1141 160 lb (72.6 kg)     Height 02/13/18 1141 5\' 4"  (1.626 m)     Head Circumference --      Peak Flow --      Pain Score  02/13/18 1138 7     Pain Loc --      Pain Edu? --      Excl. in [place]? --     Constitutional: Alert and oriented. Well appearing and in no acute distress.  Mood anxious Eyes: Conjunctivae are normal Head: Atraumatic HEENT: No congestion/rhinnorhea. Mucous membranes are moist.  Oropharynx non-erythematous Neck:   Nontender with no meningismus, no masses, no stridor Cardiovascular: Normal rate, regular rhythm. Grossly normal heart sounds.  Good peripheral circulation. Chest: Tender palpation of chest wall to touch her upper chest patient states "not that the pain right there", female chaperone present for exam.  No crepitus no flail chest, no evidence of injury or inflammation Respiratory: Normal respiratory effort.  No retractions. Lungs CTAB. Abdominal: Soft and nontender. No distention. No guarding no rebound Back:  There is no focal tenderness or step off.  there is no midline tenderness there are no lesions noted. there is no CVA tenderness Musculoskeletal: No lower extremity tenderness, no upper extremity tenderness. No joint effusions, no DVT signs strong distal pulses no edema Neurologic:  Normal speech and language. No gross focal neurologic deficits are appreciated.  Skin:  Skin is warm, dry and intact. No rash noted. Psychiatric: Mood and affect are normal. Speech and behavior are normal.  ____________________________________________   LABS (all labs ordered are listed, but only abnormal results are displayed)  Labs Reviewed  BASIC METABOLIC PANEL - Abnormal; Notable for the following components:      Result Value   Calcium 8.8 (*)    All other components within normal limits  TROPONIN I  CBC WITH DIFFERENTIAL/PLATELET    Pertinent labs  results that were available during my care of the patient were reviewed by me and considered in my medical decision making (see chart for details). ____________________________________________  EKG  I personally interpreted any EKGs  ordered by me or triage Sinus rhythm rate 96 bpm no acute ST elevation or depression, normal axis, PACs noted, no acute ischemia ____________________________________________  RADIOLOGY  Pertinent labs & imaging results that were available during my care of the patient were reviewed by me and considered in my medical decision making (see chart for details). If possible, patient and/or family made aware of any abnormal findings.  Dg Chest 2 View  Result Date: 02/13/2018 CLINICAL DATA:  Chest pain. EXAM: CHEST - 2 VIEW COMPARISON:  Jul 31, 2016 FINDINGS: The heart size and mediastinal contours are within normal limits. Both lungs are clear. The visualized skeletal structures are unremarkable. IMPRESSION: No active cardiopulmonary disease. Electronically Signed   By: Dorise Bullion III M.D   On: 02/13/2018 12:14   ____________________________________________    PROCEDURES  Procedure(s) performed: None  Procedures  Critical Care performed: None  ____________________________________________   INITIAL IMPRESSION / ASSESSMENT AND PLAN / ED COURSE  Pertinent labs & imaging results that were available during my care of the patient were reviewed by me and considered in my medical decision making (see chart for details).  Patient here with chest wall pain, very recently had a heart catheterization which was reassuring, EKG reassuring, doubly she has a PE, given her history.  Note I think she has a dissection.  We will send 2 sets of cardiac enzymes as a precaution, if that is negative we will try to get her home.  Lungs are clear, at this time.  Chest x-ray reassuring blood work is reassuring initial troponin is reassuring.  ----------------------------------------- 3:52 PM on 02/13/2018 -----------------------------------------  She has not been coughing here, there is no evidence of significant active disease fortunately at this time.  We did talk about admission versus discharge and she  is adamant that she was to go home.  She states "sometimes I have panic attacks when I cough".  However, this is in her opinion a normal cough from her smoking.  I did spend 5 minutes talking to her about smoking cessation.  I am in favor.  Patient seemed receptive to these ideas but does not think she will quit yet.  She thinks that sometimes a small burst of steroids helps with her cough, will give her that but I do not see any indication for antibiotics.  We will discharge her with close outpatient follow-up return precautions given and understood    ____________________________________________   FINAL CLINICAL IMPRESSION(S) / ED DIAGNOSES  Final diagnoses:  None      This chart was dictated using voice recognition software.  Despite best efforts to proofread,  errors can occur which can change meaning.      Schuyler Amor, MD 02/13/18 1352    Schuyler Amor, MD 02/13/18 [phone]

## 2018-02-13 NOTE — ED Triage Notes (Signed)
Pt c/o pain across entire chest that initially started in right jaw.  Took 324 mg ASA with EMS.  Pain has decreased from 10 to 7.  Pain is constant in nature.  Has had some SHOB but is not new per pt, has COPD.  Has had cough, and feels like has sputum but will not come up. Unlabored, no increased WOB. VSS. Color WNL

## 2018-02-16 ENCOUNTER — Encounter: Payer: Self-pay | Admitting: Family Medicine

## 2018-02-16 ENCOUNTER — Ambulatory Visit (INDEPENDENT_AMBULATORY_CARE_PROVIDER_SITE_OTHER): Payer: Medicare Other | Admitting: Family Medicine

## 2018-02-16 VITALS — BP 116/78 | HR 92 | Temp 98.4°F | Wt 163.0 lb

## 2018-02-16 DIAGNOSIS — R079 Chest pain, unspecified: Secondary | ICD-10-CM | POA: Diagnosis not present

## 2018-02-16 DIAGNOSIS — J019 Acute sinusitis, unspecified: Secondary | ICD-10-CM

## 2018-02-16 DIAGNOSIS — R002 Palpitations: Secondary | ICD-10-CM

## 2018-02-16 MED ORDER — PROPRANOLOL HCL 20 MG PO TABS: 20.0000 mg | ORAL_TABLET | Freq: Three times a day (TID) | ORAL | 1 refills | Status: DC

## 2018-02-16 MED ORDER — AZITHROMYCIN 250 MG PO TABS: ORAL_TABLET | ORAL | 0 refills | Status: AC

## 2018-02-16 NOTE — Progress Notes (Addendum)
Patient: Andrea [name] Female    DOB: 13-Jul-1965   52 y.o.   MRN: [id number] Visit Date: 02/16/2018  Today's Provider: Lelon Huh, MD   Chief Complaint  Patient presents with  . Follow-up    ER follow up from 02/13/2018  . Sinusitis    Started about four days ago.   Subjective:      Follow up ER visit  Patient was seen in ER for sudden onset Chest pain and right jaw pain on 02/13/2018. She states pain started all of a sudden with no preceding or precipitating factors. It went all across her mid chest and made her feel short of breath she called EMS and was taken to ER for evaluation. Had negative cardiac workup. Pain resolved by the time she left ER. Was given 60mg  prednisone in the ER with prescription to fill as outpatient, but she states she did not fill prescription  Because she woke up with rash on her fast the next day.  She has had cough that was mild at the time of her ER visit, but has gotten worse and associated with pain her sinuses. Has had no fevers. She is tender in her upper chest when she coughs, but states this is a completely different pain than the pain she had leading to her ER visit. Has not had similar pain since.   She also reports she ran out of Propranolol for several month and has been have much more palpitations since being out of medication.   ------------------------------------------------------------------------------------     Sinusitis  This is a new problem. The current episode started in the past 7 days. The problem has been gradually worsening since onset. The maximum temperature recorded prior to her arrival was 100.4 - 100.9 F (Two days ago). The fever has been present for less than 1 day. Associated symptoms include congestion, coughing, diaphoresis, a hoarse voice, shortness of breath, sinus pressure and sneezing. Pertinent negatives include no chills, ear pain, headaches, sore throat or swollen glands. Past treatments include oral  decongestants. The treatment provided no relief.    Allergies  Allergen Reactions  . Alum & Mag Hydroxide-Simeth Diarrhea and Nausea Only  . Cefdinir Other (See Comments)    tachycardia  . Chantix [Varenicline]     Bad dreams  . Doxycycline Other (See Comments)    Nausea, migraine  . Multaq [Dronedarone] Other (See Comments)    Lip/ mouth numbness/ tongue swelling  . Ivp Dye [Iodinated Diagnostic Agents] Palpitations     Current Outpatient Medications:  .  albuterol (PROVENTIL HFA;VENTOLIN HFA) 108 (90 Base) MCG/ACT inhaler, Inhale 2 puffs into the lungs every 6 (six) hours as needed for wheezing or shortness of breath., Disp: 1 Inhaler, Rfl: 3 .  albuterol (PROVENTIL) (2.5 MG/3ML) 0.083% nebulizer solution, Take 3 mLs (2.5 mg total) by nebulization every 4 (four) hours as needed for wheezing or shortness of breath. Reported on 03/19/2015, Disp: 75 mL, Rfl: 12 .  ALPRAZolam (XANAX) 1 MG tablet, TAKE 1 TABLET BY MOUTH EVERY 8 HOURS AS NEEDED, Disp: 60 tablet, Rfl: 5 .  ARIPiprazole (ABILIFY) 5 MG tablet, Take 5 mg by mouth daily as needed (anxiety and depression). , Disp: , Rfl:  .  clonazePAM (KLONOPIN) 1 MG tablet, TAKE 1 TABLET (1 MG TOTAL) BY MOUTH 2 (TWO) TIMES DAILY AS NEEDED, Disp: 90 tablet, Rfl: 3 .  DEXILANT 60 MG capsule, TAKE 1 CAPSULE BY MOUTH EVERY DAY, Disp: 30 capsule, Rfl: 1 .  hydroxychloroquine (PLAQUENIL) 200 MG tablet, TAKE 2 TABLETS BY MOUTH EVERY DAY, Disp: 180 tablet, Rfl: 1 .  nitroGLYCERIN (NITROSTAT) 0.4 MG SL tablet, Place 1 tablet (0.4 mg total) under the tongue every 5 (five) minutes as needed for chest pain., Disp: 25 tablet, Rfl: 0 .  Respiratory Therapy Supplies (FLUTTER) DEVI, 1 Device by Does not apply route daily., Disp: 1 each, Rfl: 0 .  umeclidinium-vilanterol (ANORO ELLIPTA) 62.5-25 MCG/INH AEPB, Inhale 1 puff into the lungs daily., Disp: 60 each, Rfl: 5 .  predniSONE (DELTASONE) 20 MG tablet, Take 3 tablets (60 mg total) by mouth daily., Disp: 12  tablet, Rfl: 0 .  sucralfate (CARAFATE) 1 g tablet, TAKE 1 TABLET (1 G TOTAL) BY MOUTH 4 (FOUR) TIMES DAILY - WITH MEALS AND AT BEDTIME. (Patient not taking: Reported on 02/13/2018), Disp: 28 tablet, Rfl: 11  Review of Systems  Constitutional: Positive for diaphoresis and fever. Negative for activity change, appetite change, chills, fatigue and unexpected weight change.  HENT: Positive for congestion, hoarse voice, postnasal drip, sinus pressure, sinus pain and sneezing. Negative for ear discharge, ear pain, rhinorrhea, sore throat, tinnitus and trouble swallowing.   Eyes: Positive for discharge. Negative for photophobia, pain, redness, itching and visual disturbance.  Respiratory: Positive for cough, chest tightness, shortness of breath and wheezing. Negative for apnea and choking.   Gastrointestinal: Negative.   Neurological: Positive for dizziness. Negative for light-headedness and headaches.    Social History   Tobacco Use  . Smoking status: Current Every Day Smoker    Packs/day: 0.50    Years: 16.00    Pack years: 8.00    Types: Cigarettes  . Smokeless tobacco: Never Used  . Tobacco comment: started smoking 52yo up to 1 ppd  Substance Use Topics  . Alcohol use: No    Alcohol/week: 0.0 standard drinks      Objective:   BP 116/78 (BP Location: Right Arm, Patient Position: Sitting, Cuff Size: Normal)   Pulse 92   Temp 98.4 F (36.9 C) (Oral)   Wt 163 lb (73.9 kg)   SpO2 95%   BMI 27.98 kg/m  Vitals:   02/16/18 1455  BP: 116/78  Pulse: 92  Temp: 98.4 F (36.9 C)  TempSrc: Oral  SpO2: 95%  Weight: 163 lb (73.9 kg)     Physical Exam  General Appearance:    Alert, cooperative, no distress  HENT:   bilateral TM normal without fluid or infection, neck without nodes, pharynx erythematous without exudate, frontal sinus tender and nasal mucosa congested  Eyes:    PERRL, conjunctiva/corneas clear, EOM's intact       Lungs:     Clear to auscultation bilaterally,  respirations unlabored  Heart:    Regular rate and rhythm. Tender upper chest wall.   Neurologic:   Awake, alert, oriented x 3. No apparent focal neurological           defect.          Assessment & Plan    1. Chest pain, unspecified type By her report is different than pain reproduced by palpation. She is a long time smoker with increased risk for underlying vascular and pulmonary disease.  - CT Chest Wo Contrast; Future  2. Palpitations Worse since running out of propranolol, new prescription sent to pharmacy today.   3. Acute sinusitis, recurrence not specified, unspecified location -Zpack.      Lelon Huh, MD  [facility]

## 2018-02-18 ENCOUNTER — Telehealth: Payer: Self-pay

## 2018-02-18 NOTE — Telephone Encounter (Signed)
Patient had called the office stating that she was seen on Monday and prescribed a z-pack. Patient states that antibiotic is not helping with symptoms and she only has 2 days left of the pack. Patient is wanting to know if physician can call in a stronger prescription? She request that this be sent to [facility]. KW

## 2018-02-19 MED ORDER — LEVOFLOXACIN 750 MG PO TABS: 750.0000 mg | ORAL_TABLET | Freq: Every day | ORAL | 0 refills | Status: DC

## 2018-02-19 NOTE — Telephone Encounter (Signed)
If still not better than can change to levofloxacin 750mg  one daily for 7 days.

## 2018-02-19 NOTE — Telephone Encounter (Signed)
Pt advised.  She states she took her last Azithromycin today and is still not feeling better.  She requested to have levofloxacin 750mg  sent to [facility].   Thanks,   -Mickel Baas

## 2018-02-20 ENCOUNTER — Telehealth: Payer: Self-pay

## 2018-02-20 MED ORDER — OSELTAMIVIR PHOSPHATE 75 MG PO CAPS: 75.0000 mg | ORAL_CAPSULE | Freq: Every day | ORAL | 0 refills | Status: AC

## 2018-02-20 NOTE — Telephone Encounter (Signed)
Patient called saying that someone in her household was tested positive for flu today.  Should she be taking something to prevent her from catching it? She is already on an abx for URI. [facility] uses [facility]. Contact info is correct. Thanks!

## 2018-02-20 NOTE — Telephone Encounter (Signed)
Have sent prescription tamiflu to [facility]. Take one daily for ten days.

## 2018-02-20 NOTE — Telephone Encounter (Signed)
Patient advised.

## 2018-02-23 ENCOUNTER — Ambulatory Visit
Admission: RE | Admit: 2018-02-23 | Discharge: 2018-02-23 | Disposition: A | Discharge status: Home / Self Care | Payer: Medicare Other | Source: Ambulatory Visit | Attending: Family Medicine | Admitting: Family Medicine

## 2018-02-23 DIAGNOSIS — J439 Emphysema, unspecified: Secondary | ICD-10-CM | POA: Diagnosis not present

## 2018-02-23 DIAGNOSIS — R079 Chest pain, unspecified: Secondary | ICD-10-CM

## 2018-02-24 ENCOUNTER — Other Ambulatory Visit: Payer: Self-pay | Admitting: Gastroenterology

## 2018-02-26 ENCOUNTER — Telehealth: Payer: Self-pay

## 2018-02-26 MED ORDER — AMOXICILLIN-POT CLAVULANATE 875-125 MG PO TABS: 1.0000 | ORAL_TABLET | Freq: Two times a day (BID) | ORAL | 0 refills | Status: DC

## 2018-02-26 NOTE — Telephone Encounter (Signed)
-----   Message from Birdie Sons, MD sent at 02/26/2018 10:13 AM EST ----- Chest CT shows emphysema and some mild bronchitis. Probably not the cause of her chest pain, ,but needs to be treated. Please send in prescription for Augmentin 875 twice daily for 10 days.  There are some lung nodules and worsening emphysema. Its imperative that she stop smoking.  Also needs to schedule follow up with her pulmonologist, Dr. Ashby Dawes, can give her number to call to schedule follow up.

## 2018-02-26 NOTE — Telephone Encounter (Signed)
Patient was advised and medication send in to pharmacy. Patient states that she will call to schedule a follow-up appointment with her pulmonologist.

## 2018-03-09 ENCOUNTER — Telehealth: Payer: Self-pay | Admitting: Family Medicine

## 2018-03-09 MED ORDER — FLUCONAZOLE 150 MG PO TABS: 150.0000 mg | ORAL_TABLET | Freq: Once | ORAL | 0 refills | Status: AC

## 2018-03-09 NOTE — Telephone Encounter (Signed)
Pt advised.   Thanks,   -Samarrah Tranchina  

## 2018-03-09 NOTE — Telephone Encounter (Signed)
[facility]

## 2018-03-09 NOTE — Telephone Encounter (Signed)
Pt has been on 3 antibiotics.  Pt now has a yeast infection. Pt asking if an Rx can be called into:  [id number] [place], [place] - [address] [phone] (Phone) [phone] (Fax)   Please advise.  Thanks, [employer]

## 2018-03-10 ENCOUNTER — Ambulatory Visit (INDEPENDENT_AMBULATORY_CARE_PROVIDER_SITE_OTHER): Payer: Medicare Other | Admitting: Internal Medicine

## 2018-03-10 ENCOUNTER — Encounter: Payer: Self-pay | Admitting: Internal Medicine

## 2018-03-10 ENCOUNTER — Other Ambulatory Visit: Payer: Self-pay | Admitting: Family Medicine

## 2018-03-10 VITALS — BP 104/68 | HR 85 | Ht 64.0 in | Wt 167.2 lb

## 2018-03-10 DIAGNOSIS — R0609 Other forms of dyspnea: Secondary | ICD-10-CM

## 2018-03-10 DIAGNOSIS — J449 Chronic obstructive pulmonary disease, unspecified: Secondary | ICD-10-CM

## 2018-03-10 DIAGNOSIS — R002 Palpitations: Secondary | ICD-10-CM

## 2018-03-10 DIAGNOSIS — R918 Other nonspecific abnormal finding of lung field: Secondary | ICD-10-CM

## 2018-03-10 DIAGNOSIS — Z720 Tobacco use: Secondary | ICD-10-CM

## 2018-03-10 DIAGNOSIS — F17210 Nicotine dependence, cigarettes, uncomplicated: Secondary | ICD-10-CM | POA: Diagnosis not present

## 2018-03-10 NOTE — Patient Instructions (Signed)
Use Flonase over-the-counter for nasal drainage. Use Anoro once daily. Will repeat CT chest in 9 months.   --Quitting smoking is the most important thing that you can do for your health.  --Quitting smoking will have greater affect on your health than any medicine that we can give you.

## 2018-03-10 NOTE — Progress Notes (Signed)
[facility] Note     Assessment and Plan:  The patient is a 53 year old smoker with moderate COPD, and multiple COPD exacerbations in the past. Patient also has a significant anxiety component.   Tobacco abuse. --Discussed importance of smoking cessation today. Greater than 3 minutes spent in discussion.   COPD, group D. --Continue Anoro once daily.  Chronic rhinitis. - This is likely postviral, recommended that she use Flonase.  Anxiety. --I suspect this may be playing a role in her dyspnea in addition to emphysema.  -Continue current treatment.  Lung nodule. 06/2013 CT high res chest> mild centrilobular emphysema but no ILD, multiple scattered pulm nodules all 49mm in size 07/2014 CT chest > nodule unchanged no further follow up recommended.  -Most recent CT chest 02/23/2018 shows multiple tiny groundglass nodules.  Repeat CT chest in about 9 months.  Daytime somnolence 05/2013 Sleep study [facility] > AHI 3.8  GERD. -Continue Dexilant, continue follow-up with gastroenterology.  Orders Placed This Encounter  Procedures  . CT CHEST WO CONTRAST     HPI:   Andrea [name] is a 53 year old Caucasian female with severe COPD, which appears to be complicated by anxiety.  She also has a history of fibromyalgia, reflux, panic.  She had a previous CT chest in 2015 and again in 2016, she had  small pulmonary nodules which were not felt to merit further follow-up scanning. She had a recent URTI with cough and sputum production, she was given a course of azithromycin, levofloxacin, then amoxicillin, she had a family member test positive for the flu recently and given a course of Tamiflu.  She was sent for CT of the chest on 12/23 that showed tiny bilateral lung nodules of 3 mm or less, appeared groundglass. She continues to smoke about half a pack per day. She notes that her breathing is better than when it was before, she is completing a course of antibiotics currently, she  continues to have a lot of nasal drainage.  She is smoking less than a ppd.She has GERD, she  is currently taking Dexilant.   **CT chest 02/23/2018>> images personally reviewed, apical emphysematous changes, bilateral tiny groundglass nodule nodules 3 mm or less. **Review of tracings personally, 06/18/16 PFT: FVC is 82%, FEV1 is a 66% without significant reversibility, ratio is 60%. Lung volumes are unremarkable. Diffusion capacity is 57% of predicted. Flow volume loop is consistent with obstruction. Overall, this testing is consistent with moderate objective lung disease with an FEV1 of 60%.  **Chest x-ray  06/19/14 ; showed emphysematous changes, otherwise unremarkable with hyperinflated lungs, minimal difference in comparison with previous films.  CT chest from 06/10/2013 was reviewed that showed some scattered emphysematous changes but otherwise unremarkable without evidence of bronchiectasis.  Desat walk 07/04/2016 Baseline O2 was 96% and HR 87 at rest on RA. After walking 600 feet sat was 94% and HR 98 on RA. Pt complained of mild dyspnea, but appeared comfortable and was speaking full sentences.   Medication:    Reviewed   Allergies:  Alum & mag hydroxide-simeth; Cefdinir; Chantix [varenicline]; Doxycycline; Multaq [dronedarone]; and Ivp dye [iodinated diagnostic agents]   Review of Systems:  Constitutional: Feels well. Cardiovascular: Denies chest pain, exertional chest pain.  Pulmonary: Denies hemoptysis, pleuritic chest pain.   The remainder of systems were reviewed and were found to be negative other than what is documented in the HPI.    Physical Examination:   VS: BP 104/68 (BP Location: Left Arm, Cuff Size: Normal)  Pulse 85   Ht 5\' 4"  (1.626 m)   Wt 167 lb 3.2 oz (75.8 kg)   SpO2 97%   BMI 28.70 kg/m   General Appearance: No distress  Neuro:without focal findings, mental status, speech normal, alert and oriented HEENT: PERRLA, EOM intact Pulmonary: No wheezing, No  rales  CardiovascularNormal S1,S2.  No m/r/g.  Abdomen: Benign, Soft, non-tender, No masses Renal:  No costovertebral tenderness  GU:  No performed at this time. Endoc: No evident thyromegaly, no signs of acromegaly or Cushing features Skin:   warm, no rashes, no ecchymosis  Extremities: normal, no cyanosis, clubbing.      Thank  you for the consultation and for allowing [facility] to assist in the care of your patient. Our recommendations are noted above.  Please contact us if we can be of further service.  Marda Stalker, M.D., F.C.C.P.  Board Certified in Internal Medicine, Pulmonary Medicine, [employer], and Sleep Medicine.  [facility] Office Number: [phone]

## 2018-03-29 ENCOUNTER — Other Ambulatory Visit: Payer: Self-pay | Admitting: Gastroenterology

## 2018-03-29 ENCOUNTER — Other Ambulatory Visit: Payer: Self-pay | Admitting: Family Medicine

## 2018-03-30 ENCOUNTER — Telehealth: Payer: Self-pay | Admitting: Gastroenterology

## 2018-03-30 ENCOUNTER — Other Ambulatory Visit: Payer: Self-pay | Admitting: Family Medicine

## 2018-03-30 MED ORDER — CLONAZEPAM 1 MG PO TABS: 1.0000 mg | ORAL_TABLET | Freq: Two times a day (BID) | ORAL | 5 refills | Status: DC | PRN

## 2018-03-30 NOTE — Telephone Encounter (Signed)
Called [facility] they got the refill request.   Thanks,   -Mickel Baas

## 2018-03-30 NOTE — Telephone Encounter (Signed)
Received e-prescription error message. Please see if [facility] received clonazepam prescription sent on 03-29-2018. If not then please call in. Thanks.

## 2018-03-30 NOTE — Telephone Encounter (Signed)
Pt was told to call us to schedule a follow up appt to get additional refills on her medication. Please let her know when you call to schedule, I have refilled this medication today, but only gave her 60 days worth until she is seen. Last office visit with Dr. Allen Norris was 07/02/16.

## 2018-03-30 NOTE — Telephone Encounter (Signed)
clonazePAM (KLONOPIN) 1 MG tablet - request sent to pharmacy has failed.  Please refill to: [id number] [place], [place] - [address] [phone] (Phone) [phone] (Fax)    Thanks, [facility]

## 2018-03-30 NOTE — Telephone Encounter (Signed)
Pt left vm she needs refill on rx Dexilant [facility] told [facility] to call us

## 2018-04-10 ENCOUNTER — Encounter: Payer: Self-pay | Admitting: Physician Assistant

## 2018-04-10 ENCOUNTER — Ambulatory Visit (INDEPENDENT_AMBULATORY_CARE_PROVIDER_SITE_OTHER): Payer: Medicare Other | Admitting: Physician Assistant

## 2018-04-10 VITALS — BP 136/82 | HR 91 | Temp 97.8°F | Resp 16 | Wt 167.0 lb

## 2018-04-10 DIAGNOSIS — J0141 Acute recurrent pansinusitis: Secondary | ICD-10-CM | POA: Diagnosis not present

## 2018-04-10 DIAGNOSIS — J441 Chronic obstructive pulmonary disease with (acute) exacerbation: Secondary | ICD-10-CM | POA: Diagnosis not present

## 2018-04-10 DIAGNOSIS — J339 Nasal polyp, unspecified: Secondary | ICD-10-CM | POA: Diagnosis not present

## 2018-04-10 DIAGNOSIS — J0140 Acute pansinusitis, unspecified: Secondary | ICD-10-CM | POA: Diagnosis not present

## 2018-04-10 MED ORDER — PREDNISONE 20 MG PO TABS: 40.0000 mg | ORAL_TABLET | Freq: Every day | ORAL | 0 refills | Status: DC

## 2018-04-10 MED ORDER — AZITHROMYCIN 250 MG PO TABS: ORAL_TABLET | ORAL | 0 refills | Status: DC

## 2018-04-10 NOTE — Progress Notes (Addendum)
Patient: Andrea [name] Female    DOB: 01-31-1966   53 y.o.   MRN: [id number] Visit Date: 04/10/2018  Today's Provider: Mar Daring, PA-C   Chief Complaint  Patient presents with  . Cough   Subjective:     Cough  This is a new problem. The current episode started 1 to 4 weeks ago (About a week). The problem has been gradually worsening. The cough is productive of sputum. Associated symptoms include ear congestion, ear pain, nasal congestion, postnasal drip, rhinorrhea, shortness of breath and wheezing. Pertinent negatives include no chest pain, chills, fever, headaches or sore throat. Treatments tried: Mucinex,inhlaer and nebulizer. The treatment provided no relief. Her past medical history is significant for COPD and emphysema.    Allergies  Allergen Reactions  . Alum & Mag Hydroxide-Simeth Diarrhea and Nausea Only  . Cefdinir Other (See Comments)    tachycardia  . Chantix [Varenicline]     Bad dreams  . Doxycycline Other (See Comments)    Nausea, migraine  . Multaq [Dronedarone] Other (See Comments)    Lip/ mouth numbness/ tongue swelling  . Ivp Dye [Iodinated Diagnostic Agents] Palpitations     Current Outpatient Medications:  .  albuterol (PROVENTIL HFA;VENTOLIN HFA) 108 (90 Base) MCG/ACT inhaler, Inhale 2 puffs into the lungs every 6 (six) hours as needed for wheezing or shortness of breath., Disp: 1 Inhaler, Rfl: 3 .  albuterol (PROVENTIL) (2.5 MG/3ML) 0.083% nebulizer solution, Take 3 mLs (2.5 mg total) by nebulization every 4 (four) hours as needed for wheezing or shortness of breath. Reported on 03/19/2015, Disp: 75 mL, Rfl: 12 .  ALPRAZolam (XANAX) 1 MG tablet, TAKE 1 TABLET BY MOUTH EVERY 8 HOURS AS NEEDED, Disp: 60 tablet, Rfl: 5 .  ARIPiprazole (ABILIFY) 5 MG tablet, Take 5 mg by mouth daily as needed (anxiety and depression). , Disp: , Rfl:  .  clonazePAM (KLONOPIN) 1 MG tablet, Take 1 tablet (1 mg total) by mouth 2 (two) times daily as needed., Disp:  60 tablet, Rfl: 5 .  dexlansoprazole (DEXILANT) 60 MG capsule, Take 1 capsule (60 mg total) by mouth daily. **NEED APPT FOR ADDITIONAL REFILLS**, Disp: 30 capsule, Rfl: 1 .  hydroxychloroquine (PLAQUENIL) 200 MG tablet, TAKE 2 TABLETS BY MOUTH EVERY DAY, Disp: 180 tablet, Rfl: 1 .  nitroGLYCERIN (NITROSTAT) 0.4 MG SL tablet, Place 1 tablet (0.4 mg total) under the tongue every 5 (five) minutes as needed for chest pain., Disp: 25 tablet, Rfl: 0 .  propranolol (INDERAL) 20 MG tablet, TAKE 1 TABLET BY MOUTH THREE TIMES A DAY, Disp: 90 tablet, Rfl: 1 .  Respiratory Therapy Supplies (FLUTTER) DEVI, 1 Device by Does not apply route daily., Disp: 1 each, Rfl: 0 .  umeclidinium-vilanterol (ANORO ELLIPTA) 62.5-25 MCG/INH AEPB, Inhale 1 puff into the lungs daily., Disp: 60 each, Rfl: 5  Review of Systems  Constitutional: Negative for chills and fever.  HENT: Positive for congestion, ear pain, postnasal drip, rhinorrhea, sinus pressure and voice change. Negative for sore throat.   Respiratory: Positive for cough, shortness of breath and wheezing. Negative for chest tightness.   Cardiovascular: Negative for chest pain and palpitations.  Gastrointestinal: Negative for abdominal pain and nausea.  Neurological: Negative for headaches.    Social History   Tobacco Use  . Smoking status: Current Every Day Smoker    Packs/day: 0.50    Years: 16.00    Pack years: 8.00    Types: Cigarettes  . Smokeless tobacco: Never  Used  . Tobacco comment: started smoking 53yo up to 1 ppd  Substance Use Topics  . Alcohol use: No    Alcohol/week: 0.0 standard drinks      Objective:   BP 136/82 (BP Location: Left Arm, Patient Position: Sitting, Cuff Size: Large)   Pulse 91   Temp 97.8 F (36.6 C) (Oral)   Resp 16   Wt 167 lb (75.8 kg)   SpO2 99%   BMI 28.67 kg/m  Vitals:   04/10/18 1812  BP: 136/82  Pulse: 91  Resp: 16  Temp: 97.8 F (36.6 C)  TempSrc: Oral  SpO2: 99%  Weight: 167 lb (75.8 kg)      Physical Exam Vitals signs reviewed.  Constitutional:      General: She is not in acute distress.    Appearance: She is well-developed. She is not ill-appearing or diaphoretic.  HENT:     Head: Normocephalic and atraumatic.     Right Ear: Hearing, tympanic membrane, ear canal and external ear normal.     Left Ear: Hearing, tympanic membrane, ear canal and external ear normal.     Nose:     Right Sinus: Maxillary sinus tenderness and frontal sinus tenderness present.     Left Sinus: Maxillary sinus tenderness and frontal sinus tenderness present.     Comments: Nasal polyp noted in the right nare    Mouth/Throat:     Pharynx: Uvula midline. No oropharyngeal exudate.  Neck:     Musculoskeletal: Normal range of motion and neck supple.     Thyroid: No thyromegaly.     Trachea: No tracheal deviation.  Cardiovascular:     Rate and Rhythm: Normal rate and regular rhythm.     Heart sounds: Normal heart sounds. No murmur. No friction rub. No gallop.   Pulmonary:     Effort: Pulmonary effort is normal. No respiratory distress.     Breath sounds: No stridor. Wheezing and rhonchi present. No rales.  Lymphadenopathy:     Cervical: No cervical adenopathy.  Neurological:     Mental Status: She is alert.         Assessment & Plan    1. Acute recurrent pansinusitis Worsening symptoms that have not responded to OTC medications. Will give Zpak as below. Continue allergy medications. Stay well hydrated and get plenty of rest. Call if no symptom improvement or if symptoms worsen. - azithromycin (ZITHROMAX) 250 MG tablet; Take 2 tablets PO on day one, and one tablet PO daily thereafter until completed.  Dispense: 6 tablet; Refill: 0 - predniSONE (DELTASONE) 20 MG tablet; Take 2 tablets (40 mg total) by mouth daily with breakfast.  Dispense: 8 tablet; Refill: 0  2. COPD exacerbation (HCC) Prednisone added for COPD exacerbation. Call if worsening. - predniSONE (DELTASONE) 20 MG tablet; Take  2 tablets (40 mg total) by mouth daily with breakfast.  Dispense: 8 tablet; Refill: 0     Mar Daring, PA-C  [facility]

## 2018-04-10 NOTE — Patient Instructions (Signed)
Chronic Bronchitis  Chronic bronchitis is long-lasting inflammation of the tubes that carry air into your lungs (bronchial tubes). This is inflammation that occurs:  · On most days of the week.  · For at least three months at a time.  · Over a period of two years in a row.  When the bronchial tubes are inflamed, they start to produce mucus. The inflammation and buildup of mucus make it more difficult to breathe. Chronic bronchitis is usually a permanent problem. It is one type of chronic obstructive pulmonary disease (COPD). People with chronic bronchitis are more likely to get frequent colds or respiratory infections.  What are the causes?  Chronic bronchitis most often occurs in people who:  · Have chronic, severe asthma.  · Have a history of smoking.  · Have asthma and smoke.  · Have certain lung diseases.  · Have had long-term exposure to certain irritating fumes or chemicals.  What are the signs or symptoms?  Symptoms of chronic bronchitis may include:  · A cough that brings up mucus (productive cough).  · Shortness of breath.  · Loud breathing (wheezing).  · Chest discomfort.  · Frequent (recurring) colds or respiratory infections.  Certain things can trigger chronic bronchitis symptoms or make them worse, such as:  · Infections.  · Stopping certain medicines.  · Smoking.  · Exposure to chemicals.  How is this diagnosed?  This condition may be diagnosed based on:  · Your symptoms and medical history.  · A physical exam.  · A chest X-ray.  · Lung (pulmonary) function tests.  How is this treated?  There is no cure for chronic bronchitis, but treatment can help control your symptoms. Treatment may include:  · Using a cool mist vaporizer or humidifier to make it easier to breathe.  · Drinking more fluids. Drinking more makes your mucus thinner, which may make it easier to breathe.  · Lifestyle changes, such as eating a healthier diet and getting more exercise.  · Medicines, such as:  ? Inhalers to improve air flow  in and out of your lungs.  ? Antibiotics to treat any bacterial infections you have, such as:  § Lung infection (pneumonia).  § Sinus infection.  § A sudden, severe (acute) episode of bronchitis.  · Oxygen therapy.  · Preventing infections by keeping up to date on vaccinations, including the pneumonia and flu vaccines.  · Pulmonary rehabilitation. This is a program that helps you manage your breathing problems and improve your quality of life. It may last for up to 4-12 weeks and may include exercise programs, education, counseling, and treatment support.  Follow these instructions at home:  Medicines  · Take over-the-counter and prescription medicines only as told by your health care provider.  · If you were prescribed an antibiotic medicine, take it as told by your health care provider. Do not stop taking the antibiotic even if you start to feel better.  Preventing infections  · Get vaccinations as told by your health care provider. Make sure you get a flu shot (influenza vaccine) every year.  · Wash your hands often with soap and water. If soap and water are not available, use hand sanitizer.  · Avoid contact with people who have symptoms of a cold or the flu.  Managing symptoms    · Do not smoke, and avoid secondhand smoke. Exposure to cigarette smoke or irritating chemicals will make bronchitis worse. If you smoke and you need help quitting, ask your health   from your health care provider about how to use oxygen safely and take precautions to prevent fire. Make sure you never smoke while using oxygen or allow others to smoke in your home.  Do not wait to get medical care if  you have any concerning symptoms or trouble breathing. Waiting could cause permanent injury and may be life threatening. General instructions  Talk with your health care provider about what activities are safe for you and about possible exercise routines. Regular exercise is very important to help you feel better.  Drink enough fluids to keep your urine pale yellow.  Keep all follow-up visits as told by your health care provider. This is important. Contact a health care provider if:  You have coughing or shortness of breath that gets worse.  You have muscle aches.  You have chest pain.  Your mucus seems to get thicker.  Your mucus changes from clear or white to yellow, green, gray, or bloody. Get help right away if:  Your usual medicines do not stop your wheezing.  You have severe difficulty breathing. These symptoms may represent a serious problem that is an emergency. Do not wait to see if the symptoms will go away. Get medical help right away. Call your local emergency services (911 in the [place] Do not drive yourself to the hospital. Summary  Chronic bronchitis is long-lasting inflammation of the tubes that carry air into your lungs (bronchial tubes).  Chronic bronchitis is usually a permanent problem. It is one type of chronic obstructive pulmonary disease (COPD).  There is no cure for chronic bronchitis, but treatment can help control your symptoms.  Do not smoke, and avoid secondhand smoke. Exposure to cigarette smoke or irritating chemicals will make bronchitis worse. This information is not intended to replace advice given to you by your health care provider. Make sure you discuss any questions you have with your health care provider. Document Released: 12/06/2005 Document Revised: 01/08/2017 Document Reviewed: 01/08/2017 Elsevier Interactive Patient Education  2019 [employer] Inc. Sinusitis, Adult Sinusitis is soreness and swelling (inflammation) of your sinuses.  Sinuses are hollow spaces in the bones around your face. They are located:  Around your eyes.  In the middle of your forehead.  Behind your nose.  In your cheekbones. Your sinuses and nasal passages are lined with a fluid called mucus. Mucus drains out of your sinuses. Swelling can trap mucus in your sinuses. This lets germs (bacteria, virus, or fungus) grow, which leads to infection. Most of the time, this condition is caused by a virus. What are the causes? This condition is caused by:  Allergies.  Asthma.  Germs.  Things that block your nose or sinuses.  Growths in the nose (nasal polyps).  Chemicals or irritants in the air.  Fungus (rare). What increases the risk? You are more likely to develop this condition if:  You have a weak body defense system (immune system).  You do a lot of swimming or diving.  You use nasal sprays too much.  You smoke. What are the signs or symptoms? The main symptoms of this condition are pain and a feeling of pressure around the sinuses. Other symptoms include:  Stuffy nose (congestion).  Runny nose (drainage).  Swelling and warmth in the sinuses.  Headache.  Toothache.  A cough that may get worse at night.  Mucus that collects in the throat or the back of the nose (postnasal drip).  Being unable to smell and taste.  Being very tired (fatigue).  A  fever.  Sore throat.  Bad breath. How is this diagnosed? This condition is diagnosed based on:  Your symptoms.  Your medical history.  A physical exam.  Tests to find out if your condition is short-term (acute) or long-term (chronic). Your doctor may: ? Check your nose for growths (polyps). ? Check your sinuses using a tool that has a light (endoscope). ? Check for allergies or germs. ? Do imaging tests, such as an MRI or CT scan. How is this treated? Treatment for this condition depends on the cause and whether it is short-term or long-term.  If caused by a  virus, your symptoms should go away on their own within 10 days. You may be given medicines to relieve symptoms. They include: ? Medicines that shrink swollen tissue in the nose. ? Medicines that treat allergies (antihistamines). ? A spray that treats swelling of the nostrils. ? Rinses that help get rid of thick mucus in your nose (nasal saline washes).  If caused by bacteria, your doctor may wait to see if you will get better without treatment. You may be given antibiotic medicine if you have: ? A very bad infection. ? A weak body defense system.  If caused by growths in the nose, you may need to have surgery. Follow these instructions at home: Medicines  Take, use, or apply over-the-counter and prescription medicines only as told by your doctor. These may include nasal sprays.  If you were prescribed an antibiotic medicine, take it as told by your doctor. Do not stop taking the antibiotic even if you start to feel better. Hydrate and humidify   Drink enough water to keep your pee (urine) pale yellow.  Use a cool mist humidifier to keep the humidity level in your home above 50%.  Breathe in steam for 10-15 minutes, 3-4 times a day, or as told by your doctor. You can do this in the bathroom while a hot shower is running.  Try not to spend time in cool or dry air. Rest  Rest as much as you can.  Sleep with your head raised (elevated).  Make sure you get enough sleep each night. General instructions   Put a warm, moist washcloth on your face 3-4 times a day, or as often as told by your doctor. This will help with discomfort.  Wash your hands often with soap and water. If there is no soap and water, use hand sanitizer.  Do not smoke. Avoid being around people who are smoking (secondhand smoke).  Keep all follow-up visits as told by your doctor. This is important. Contact a doctor if:  You have a fever.  Your symptoms get worse.  Your symptoms do not get better within  10 days. Get help right away if:  You have a very bad headache.  You cannot stop throwing up (vomiting).  You have very bad pain or swelling around your face or eyes.  You have trouble seeing.  You feel confused.  Your neck is stiff.  You have trouble breathing. Summary  Sinusitis is swelling of your sinuses. Sinuses are hollow spaces in the bones around your face.  This condition is caused by tissues in your nose that become inflamed or swollen. This traps germs. These can lead to infection.  If you were prescribed an antibiotic medicine, take it as told by your doctor. Do not stop taking it even if you start to feel better.  Keep all follow-up visits as told by your doctor. This is important.  This information is not intended to replace advice given to you by your health care provider. Make sure you discuss any questions you have with your health care provider. Document Released: 08/07/2007 Document Revised: 07/21/2017 Document Reviewed: 07/21/2017 Elsevier Interactive Patient Education  2019 [employer].

## 2018-04-13 ENCOUNTER — Telehealth: Payer: Self-pay | Admitting: Family Medicine

## 2018-04-13 NOTE — Addendum Note (Signed)
Addended by: Mar Daring on: 04/13/2018 08:46 AM   Modules accepted: Orders

## 2018-04-13 NOTE — Telephone Encounter (Signed)
Pled saying she needs a new referral for the polyps in her nose.  To [facility]  It has been too long since she was in.  Pt's CB# [phone]  thank [facility]

## 2018-04-13 NOTE — Telephone Encounter (Signed)
Please Review

## 2018-04-13 NOTE — Telephone Encounter (Signed)
Referral placed.

## 2018-04-14 ENCOUNTER — Other Ambulatory Visit: Payer: Self-pay | Admitting: Family Medicine

## 2018-04-14 DIAGNOSIS — R002 Palpitations: Secondary | ICD-10-CM

## 2018-04-20 ENCOUNTER — Other Ambulatory Visit: Payer: Self-pay | Admitting: Family Medicine

## 2018-05-05 DIAGNOSIS — J320 Chronic maxillary sinusitis: Secondary | ICD-10-CM | POA: Diagnosis not present

## 2018-05-05 DIAGNOSIS — J301 Allergic rhinitis due to pollen: Secondary | ICD-10-CM | POA: Diagnosis not present

## 2018-05-05 DIAGNOSIS — J3489 Other specified disorders of nose and nasal sinuses: Secondary | ICD-10-CM | POA: Diagnosis not present

## 2018-05-05 DIAGNOSIS — J330 Polyp of nasal cavity: Secondary | ICD-10-CM | POA: Diagnosis not present

## 2018-05-11 DIAGNOSIS — J342 Deviated nasal septum: Secondary | ICD-10-CM | POA: Diagnosis not present

## 2018-05-12 ENCOUNTER — Ambulatory Visit: Payer: Medicare Other | Admitting: Gastroenterology

## 2018-05-21 ENCOUNTER — Other Ambulatory Visit: Payer: Self-pay | Admitting: Family Medicine

## 2018-05-30 ENCOUNTER — Other Ambulatory Visit: Payer: Self-pay | Admitting: Gastroenterology

## 2018-06-04 NOTE — Telephone Encounter (Signed)
Pt left vm she has an apt coming up 07/07/18 she needs a refill on Rx Dexilint until its safe for her to come see Dr. Allen Norris.

## 2018-06-27 ENCOUNTER — Other Ambulatory Visit: Payer: Self-pay | Admitting: Gastroenterology

## 2018-06-29 NOTE — Progress Notes (Signed)
Lucilla Lame, MD [address]  [place]  [place],  [address]  Main: [phone]  Fax: [phone]    Gastroenterology Virtual/Video Visit  Referring Provider:     Birdie Sons, MD Primary Care Physician:  Birdie Sons, MD Primary Gastroenterologist:  Dr.Aydan Levitz [place] Reason for Consultation:     GERD with need for medication refill        HPI:    Virtual Visit via Video Note Location of the patient: Home Location of provider: Office  Participating persons: The patient myself and Ginger Feldpausch.  I connected with Andrea [name] on 06/29/18 at  9:00 AM EDT by a video enabled telemedicine application and verified that I am speaking with the correct person using two identifiers.   I discussed the limitations of evaluation and management by telemedicine and the availability of in person appointments. The patient expressed understanding and agreed to proceed.  Verbal consent to proceed obtained.  History of Present Illness: Andrea [name] is a 53 y.o. female referred by Dr. Birdie Sons, MD  for GERD and need for refill of medication.  The patient was last seen by me in 2018.  The patient had called in for prescription but had not been seen in some time and was given a 60-day supply and told that she would need to have a follow-up.  The patient also reports that her father died of colon cancer and she is due for a colonoscopy.  The patient has acid breakthrough usually approximately 1 hour after she takes her Dexilant but reports it only happens approximately once a month.  There is no report of any dysphasia nausea vomiting hematemesis melena or bloody stools.   Past Medical History:  Diagnosis Date  . Anxiety    panic attacks, chest pain  . Arthritis   . COPD (chronic obstructive pulmonary disease) ([place])   . GERD (gastroesophageal reflux disease)   . Hypertension    not medicated  . Non-obstructive CAD in native artery    a. cardiac cath 01/2014:  showed minor irregularities, mildly elevated left ventricular end-diastolic pressure and normal ejection fraction; b. 03/2015 MV: low risk.  . Palpitations    a. 08/2016 Event Monitor: occas PVC's, two brief runs of SVT (4 and 7 beats).  Marland Kitchen PONV (postoperative nausea and vomiting)   . Symptomatic PVC's (premature ventricular contractions)   . Tobacco abuse   . Wears dentures    partial upper and lower    Past Surgical History:  Procedure Laterality Date  . CARDIAC CATHETERIZATION  01/03/14  . CARDIAC CATHETERIZATION    . ESOPHAGOGASTRODUODENOSCOPY (EGD) WITH PROPOFOL N/A 07/11/2016   Procedure: ESOPHAGOGASTRODUODENOSCOPY (EGD) WITH PROPOFOL;  Surgeon: Lucilla Lame, MD;  Location: [facility];  Service: Endoscopy;  Laterality: N/A;  . EYE SURGERY    . IMAGE GUIDED SINUS SURGERY N/A 11/30/2014   Procedure: IMAGE GUIDED SINUS SURGERY;  Surgeon: Carloyn Manner, MD;  Location: [facility];  Service: ENT;  Laterality: N/A;  GAVE DISK TO CE CE  . MAXILLARY ANTROSTOMY Bilateral 11/30/2014   Procedure: MAXILLARY ANTROSTOMY;  Surgeon: Carloyn Manner, MD;  Location: [facility];  Service: ENT;  Laterality: Bilateral;  . SEPTOPLASTY N/A 11/30/2014   Procedure: SEPTOPLASTY;  Surgeon: Carloyn Manner, MD;  Location: [facility];  Service: ENT;  Laterality: N/A;  . SPHENOIDECTOMY Left 11/30/2014   Procedure: Coralee Pesa, ;  Surgeon: Carloyn Manner, MD;  Location: [facility];  Service: ENT;  Laterality: Left;  .  VAGINAL HYSTERECTOMY  1995   vaginal    Prior to Admission medications   Medication Sig Start Date End Date Taking? Authorizing Provider  albuterol (PROVENTIL HFA;VENTOLIN HFA) 108 (90 Base) MCG/ACT inhaler Inhale 2 puffs into the lungs every 6 (six) hours as needed for wheezing or shortness of breath. 06/11/16   Birdie Sons, MD  albuterol (PROVENTIL) (2.5 MG/3ML) 0.083% nebulizer solution Take 3 mLs (2.5 mg total) by nebulization every 4 (four)  hours as needed for wheezing or shortness of breath. Reported on 03/19/2015 03/19/15   Theodoro Grist, MD  ALPRAZolam Duanne Moron) 1 MG tablet TAKE 1 TABLET BY MOUTH EVERY 8 HOURS AS NEEDED 05/21/18   Birdie Sons, MD  ARIPiprazole (ABILIFY) 5 MG tablet Take 5 mg by mouth daily as needed (anxiety and depression).     [provider]  azithromycin (ZITHROMAX) 250 MG tablet Take 2 tablets PO on day one, and one tablet PO daily thereafter until completed. 04/10/18   Mar Daring, PA-C  clonazePAM (KLONOPIN) 1 MG tablet Take 1 tablet (1 mg total) by mouth 2 (two) times daily as needed. 03/30/18   Birdie Sons, MD  DEXILANT 60 MG capsule TAKE 1 CAPSULE (60 MG TOTAL) BY MOUTH DAILY. **NEED APPT FOR ADDITIONAL REFILLS** 06/04/18   Lucilla Lame, MD  hydroxychloroquine (PLAQUENIL) 200 MG tablet TAKE 2 TABLETS BY MOUTH EVERY DAY 04/20/18   Birdie Sons, MD  nitroGLYCERIN (NITROSTAT) 0.4 MG SL tablet Place 1 tablet (0.4 mg total) under the tongue every 5 (five) minutes as needed for chest pain. 12/08/14   Wellington Hampshire, MD  predniSONE (DELTASONE) 20 MG tablet Take 2 tablets (40 mg total) by mouth daily with breakfast. 04/10/18   Mar Daring, PA-C  propranolol (INDERAL) 20 MG tablet TAKE 1 TABLET BY MOUTH THREE TIMES A DAY 04/14/18   Birdie Sons, MD  Respiratory Therapy Supplies (FLUTTER) DEVI 1 Device by Does not apply route daily. 03/19/15   Theodoro Grist, MD  umeclidinium-vilanterol (ANORO ELLIPTA) 62.5-25 MCG/INH AEPB Inhale 1 puff into the lungs daily. 07/04/16   Laverle Hobby, MD    Family History  Problem Relation Age of Onset  . Emphysema Father   . Asthma Father   . Heart disease Father   . Heart attack Father   . Arthritis Father        RA  . Colon cancer Father   . Hypertension Mother   . Breast cancer Neg Hx      Social History   Tobacco Use  . Smoking status: Current Every Day Smoker    Packs/day: 0.50    Years: 16.00    Pack years: 8.00    Types:  Cigarettes  . Smokeless tobacco: Never Used  . Tobacco comment: started smoking 53yo up to 1 ppd  Substance Use Topics  . Alcohol use: No    Alcohol/week: 0.0 standard drinks  . Drug use: No    Allergies as of 06/30/2018 - Review Complete 04/10/2018  Allergen Reaction Noted  . Alum & mag hydroxide-simeth Diarrhea and Nausea Only 11/02/2014  . Cefdinir Other (See Comments) 07/06/2013  . Chantix [varenicline]  06/11/2016  . Doxycycline Other (See Comments) 11/18/2013  . Multaq [dronedarone] Other (See Comments) 07/22/2017  . Ivp dye [iodinated diagnostic agents] Palpitations 05/20/2013    Review of Systems:    All systems reviewed and negative except where noted in HPI.   Observations/Objective:  Labs: CBC    Component Value Date/Time  WBC 8.8 02/13/2018 1146   RBC 4.98 02/13/2018 1146   HGB 13.2 02/13/2018 1146   HGB 15.1 06/19/2014 0847   HCT 40.3 02/13/2018 1146   HCT 44.5 06/19/2014 0847   PLT 255 02/13/2018 1146   PLT 174 06/19/2014 0847   MCV 80.9 02/13/2018 1146   MCV 85 06/19/2014 0847   MCH 26.5 02/13/2018 1146   MCHC 32.8 02/13/2018 1146   RDW 15.2 02/13/2018 1146   RDW 14.1 06/19/2014 0847   LYMPHSABS 1.9 02/13/2018 1146   LYMPHSABS 3.7 (H) 12/30/2013 0949   LYMPHSABS 3.1 10/21/2013 1424   MONOABS 0.9 02/13/2018 1146   MONOABS 0.6 10/21/2013 1424   EOSABS 0.1 02/13/2018 1146   EOSABS 0.1 12/30/2013 0949   EOSABS 0.1 10/21/2013 1424   BASOSABS 0.1 02/13/2018 1146   BASOSABS 0.1 12/30/2013 0949   BASOSABS 0.1 10/21/2013 1424   CMP     Component Value Date/Time   NA 135 02/13/2018 1146   NA 135 06/19/2014 0847   K 4.0 02/13/2018 1146   K 3.8 06/19/2014 0847   CL 101 02/13/2018 1146   CL 102 06/19/2014 0847   CO2 25 02/13/2018 1146   CO2 25 06/19/2014 0847   GLUCOSE 94 02/13/2018 1146   GLUCOSE 113 (H) 06/19/2014 0847   BUN 13 02/13/2018 1146   BUN 14 06/19/2014 0847   CREATININE 0.72 02/13/2018 1146   CREATININE 0.70 11/18/2016 1502    CALCIUM 8.8 (L) 02/13/2018 1146   CALCIUM 8.9 06/19/2014 0847   PROT 7.4 08/02/2017 1713   PROT 7.8 01/17/2014 2048   ALBUMIN 3.7 08/02/2017 1713   ALBUMIN 3.6 01/17/2014 2048   AST 17 08/02/2017 1713   AST 18 01/17/2014 2048   ALT 16 08/02/2017 1713   ALT 26 01/17/2014 2048   ALKPHOS 81 08/02/2017 1713   ALKPHOS 114 01/17/2014 2048   BILITOT 0.5 08/02/2017 1713   BILITOT 0.3 01/17/2014 2048   GFRNONAA >60 02/13/2018 1146   GFRNONAA 99 11/18/2016 1502   GFRAA >60 02/13/2018 1146   GFRAA 115 11/18/2016 1502    Imaging Studies: No results found.  Assessment and Plan:   Andrea [name] is a 53 y.o. y/o female for follow-up of her chronic heartburn.  The patient is doing well on Dexilant with only acid breakthrough approximately once a month.  The patient has been given a refill of her Dexilant with a prescription sent electronically.  The patient has also been told that she is in need of a screening colonoscopy.  The patient will be set up for a screening colonoscopy at the next available appointment after the pandemic has resolved.  The patient has been explained the plan and agrees with it.  Follow Up Instructions:  I discussed the assessment and treatment plan with the patient. The patient was provided an opportunity to ask questions and all were answered. The patient agreed with the plan and demonstrated an understanding of the instructions.   The patient was advised to call back or seek an in-person evaluation if the symptoms worsen or if the condition fails to improve as anticipated.  I provided 15 minutes of non-face-to-face time during this encounter.   Lucilla Lame, MD  Speech recognition software was used to dictate the above note.

## 2018-06-30 ENCOUNTER — Encounter: Payer: Self-pay | Admitting: Gastroenterology

## 2018-06-30 ENCOUNTER — Ambulatory Visit (INDEPENDENT_AMBULATORY_CARE_PROVIDER_SITE_OTHER): Payer: Medicare Other | Admitting: Gastroenterology

## 2018-06-30 ENCOUNTER — Other Ambulatory Visit: Payer: Self-pay

## 2018-06-30 DIAGNOSIS — K219 Gastro-esophageal reflux disease without esophagitis: Secondary | ICD-10-CM | POA: Diagnosis not present

## 2018-07-07 ENCOUNTER — Ambulatory Visit: Payer: Medicare Other | Admitting: Gastroenterology

## 2018-07-08 ENCOUNTER — Telehealth: Payer: Self-pay

## 2018-07-08 NOTE — Telephone Encounter (Signed)

## 2018-07-09 ENCOUNTER — Encounter: Payer: Medicare Other | Admitting: Cardiovascular Disease

## 2018-07-09 ENCOUNTER — Other Ambulatory Visit: Payer: Self-pay

## 2018-07-09 DIAGNOSIS — I493 Ventricular premature depolarization: Principal | ICD-10-CM

## 2018-07-09 NOTE — Progress Notes (Signed)
The patient could not be reached for a virtual visit.   This encounter was created in error - please disregard.

## 2018-07-23 ENCOUNTER — Other Ambulatory Visit: Payer: Self-pay

## 2018-07-23 ENCOUNTER — Telehealth (INDEPENDENT_AMBULATORY_CARE_PROVIDER_SITE_OTHER): Payer: Medicare Other | Admitting: Cardiovascular Disease

## 2018-07-23 ENCOUNTER — Encounter: Payer: Self-pay | Admitting: Cardiovascular Disease

## 2018-07-23 VITALS — Ht 63.0 in | Wt 160.0 lb

## 2018-07-23 DIAGNOSIS — I493 Ventricular premature depolarization: Secondary | ICD-10-CM

## 2018-07-23 NOTE — Patient Instructions (Signed)
Medication Instructions:  No change in medications If you need a refill on your cardiac medications before your next appointment, please call your pharmacy.   Lab work: None If you have labs (blood work) drawn today and your tests are completely normal, you will receive your results only by: Marland Kitchen MyChart Message (if you have MyChart) OR . A paper copy in the mail If you have any lab test that is abnormal or we need to change your treatment, we will call you to review the results.  Testing/Procedures: None  Follow-Up:  You will need a follow up appointment in 6 months.

## 2018-07-23 NOTE — Progress Notes (Signed)
Virtual Visit via Video Note   This visit type was conducted due to national recommendations for restrictions regarding the COVID-19 Pandemic (e.g. social distancing) in an effort to limit this patient's exposure and mitigate transmission in our community.  Due to her co-morbid illnesses, this patient is at least at moderate risk for complications without adequate follow up.  This format is felt to be most appropriate for this patient at this time.  All issues noted in this document were discussed and addressed.  A limited physical exam was performed with this format.  Please refer to the patient's chart for her consent to telehealth for [facility].   Date:  07/23/2018   ID:  Andrea [name], DOB 1966/02/06, MRN [id number]  Patient Location: Home Provider Location: Office  PCP:  Birdie Sons, MD  Cardiologist:  No primary care provider on file.  Electrophysiologist:  None   Evaluation Performed:  Follow-Up Visit  Chief Complaint: Palpitations  History of Present Illness:    Andrea [name] is a 53 y.o. female was seen via video visit for follow-up regarding symptomatic PVCs. She has known history of minor nonobstructive coronary artery disease on previous cardiac catheterization in November 2015. Other medical problems include tobacco use, hypertension, anxiety with panic attacks. She has been seen by Dr. Caryl Comes for symptomatic PVCs and tried multiple antiarrhythmic medications with no improvement.  She was referred to Dr. Rayann Heman to evaluate for ablation but it was felt that chance of success was low.  She is currently taking propranolol twice daily with reasonable control although she continues to have intermittent palpitations.  She denies chest pain.  She has stable exertional dyspnea continues to smoke half a pack per day.  The patient does not have symptoms concerning for COVID-19 infection (fever, chills, cough, or new shortness of breath).    Past Medical History:   Diagnosis Date  . Anxiety    panic attacks, chest pain  . Arthritis   . COPD (chronic obstructive pulmonary disease) ([place])   . GERD (gastroesophageal reflux disease)   . Hypertension    not medicated  . Non-obstructive CAD in native artery    a. cardiac cath 01/2014: showed minor irregularities, mildly elevated left ventricular end-diastolic pressure and normal ejection fraction; b. 03/2015 MV: low risk.  . Palpitations    a. 08/2016 Event Monitor: occas PVC's, two brief runs of SVT (4 and 7 beats).  Marland Kitchen PONV (postoperative nausea and vomiting)   . Symptomatic PVC's (premature ventricular contractions)   . Tobacco abuse   . Wears dentures    partial upper and lower   Past Surgical History:  Procedure Laterality Date  . CARDIAC CATHETERIZATION  01/03/14  . CARDIAC CATHETERIZATION    . ESOPHAGOGASTRODUODENOSCOPY (EGD) WITH PROPOFOL N/A 07/11/2016   Procedure: ESOPHAGOGASTRODUODENOSCOPY (EGD) WITH PROPOFOL;  Surgeon: Lucilla Lame, MD;  Location: [facility];  Service: Endoscopy;  Laterality: N/A;  . EYE SURGERY    . IMAGE GUIDED SINUS SURGERY N/A 11/30/2014   Procedure: IMAGE GUIDED SINUS SURGERY;  Surgeon: Carloyn Manner, MD;  Location: [facility];  Service: ENT;  Laterality: N/A;  GAVE DISK TO CE CE  . MAXILLARY ANTROSTOMY Bilateral 11/30/2014   Procedure: MAXILLARY ANTROSTOMY;  Surgeon: Carloyn Manner, MD;  Location: [facility];  Service: ENT;  Laterality: Bilateral;  . SEPTOPLASTY N/A 11/30/2014   Procedure: SEPTOPLASTY;  Surgeon: Carloyn Manner, MD;  Location: [facility];  Service: ENT;  Laterality: N/A;  . SPHENOIDECTOMY Left 11/30/2014  Procedure: SPHENOIDECTOMY, ;  Surgeon: Carloyn Manner, MD;  Location: [facility];  Service: ENT;  Laterality: Left;  Marland Kitchen VAGINAL HYSTERECTOMY  1995   vaginal     Current Meds  Medication Sig  . albuterol (PROVENTIL HFA;VENTOLIN HFA) 108 (90 Base) MCG/ACT inhaler Inhale 2 puffs into the lungs every 6  (six) hours as needed for wheezing or shortness of breath.  Marland Kitchen albuterol (PROVENTIL) (2.5 MG/3ML) 0.083% nebulizer solution Take 3 mLs (2.5 mg total) by nebulization every 4 (four) hours as needed for wheezing or shortness of breath. Reported on 03/19/2015  . ALPRAZolam (XANAX) 1 MG tablet TAKE 1 TABLET BY MOUTH EVERY 8 HOURS AS NEEDED  . clonazePAM (KLONOPIN) 1 MG tablet Take 1 tablet (1 mg total) by mouth 2 (two) times daily as needed.  Marland Kitchen DEXILANT 60 MG capsule TAKE 1 CAPSULE (60 MG TOTAL) BY MOUTH DAILY. **NEED APPT FOR ADDITIONAL REFILLS**  . hydroxychloroquine (PLAQUENIL) 200 MG tablet TAKE 2 TABLETS BY MOUTH EVERY DAY  . nitroGLYCERIN (NITROSTAT) 0.4 MG SL tablet Place 1 tablet (0.4 mg total) under the tongue every 5 (five) minutes as needed for chest pain.  Marland Kitchen propranolol (INDERAL) 20 MG tablet TAKE 1 TABLET BY MOUTH THREE TIMES A DAY (Patient taking differently: Take 20 mg by mouth 2 (two) times daily. (BETA BLOCKER))  . Respiratory Therapy Supplies (FLUTTER) DEVI 1 Device by Does not apply route daily.  Marland Kitchen umeclidinium-vilanterol (ANORO ELLIPTA) 62.5-25 MCG/INH AEPB Inhale 1 puff into the lungs daily.     Allergies:   Alum & mag hydroxide-simeth; Aspirin; Cefdinir; Chantix [varenicline]; Doxycycline; Multaq [dronedarone]; and Ivp dye [iodinated diagnostic agents]   Social History   Tobacco Use  . Smoking status: Current Every Day Smoker    Packs/day: 0.50    Years: 16.00    Pack years: 8.00    Types: Cigarettes  . Smokeless tobacco: Never Used  . Tobacco comment: started smoking 53yo up to 1 ppd  Substance Use Topics  . Alcohol use: No    Alcohol/week: 0.0 standard drinks  . Drug use: No     Family Hx: The patient's family history includes Arthritis in her father; Asthma in her father; Colon cancer in her father; Emphysema in her father; Heart attack in her father; Heart disease in her father; Hypertension in her mother. There is no history of Breast cancer.  ROS:   Please see  the history of present illness.     All other systems reviewed and are negative.   Prior CV studies:   The following studies were reviewed today:  Reviewed most recent echocardiogram and Holter monitor with her  Labs/Other Tests and Data Reviewed:    EKG:  No ECG reviewed.  Recent Labs: 08/02/2017: ALT 16; Magnesium 1.7 02/13/2018: BUN 13; Creatinine, Ser 0.72; Hemoglobin 13.2; Platelets 255; Potassium 4.0; Sodium 135   Recent Lipid Panel Lab Results  Component Value Date/Time   CHOL 193 06/11/2016 03:04 PM   TRIG 159 (H) 06/11/2016 03:04 PM   HDL 40 06/11/2016 03:04 PM   CHOLHDL 4.8 (H) 06/11/2016 03:04 PM   CHOLHDL 5.7 03/19/2015 08:12 AM   LDLCALC 121 (H) 06/11/2016 03:04 PM    Wt Readings from Last 3 Encounters:  07/23/18 160 lb (72.6 kg)  04/10/18 167 lb (75.8 kg)  03/10/18 167 lb 3.2 oz (75.8 kg)     Objective:    Vital Signs:  Ht 5\' 3"  (1.6 m)   Wt 160 lb (72.6 kg)   BMI 28.34 kg/m  VITAL SIGNS:  reviewed GEN:  no acute distress EYES:  sclerae anicteric, EOMI - Extraocular Movements Intact RESPIRATORY:  normal respiratory effort, symmetric expansion SKIN:  no rash, lesions or ulcers. MUSCULOSKELETAL:  no obvious deformities. NEURO:  alert and oriented x 3, no obvious focal deficit PSYCH:  normal affect  ASSESSMENT & PLAN:    1. Symptomatic PVCs: She continues to have intermittent palpitations.  She takes propranolol twice daily and has tried multiple medications in the past.  I recommend continuing same treatment for now.  If symptoms persist, we can consider repeat Holter monitor during next visit. 2. Tobacco use: I discussed the importance of smoking cessation.  COVID-19 Education: The signs and symptoms of COVID-19 were discussed with the patient and how to seek care for testing (follow up with PCP or arrange E-visit).  The importance of social distancing was discussed today.  Time:   Today, I have spent 8 minutes with the patient with telehealth  technology discussing the above problems.     Medication Adjustments/Labs and Tests Ordered: Current medicines are reviewed at length with the patient today.  Concerns regarding medicines are outlined above.   Tests Ordered: No orders of the defined types were placed in this encounter.   Medication Changes: No orders of the defined types were placed in this encounter.   Disposition:  Follow up in 6 month(s)  Signed, Kathlyn Sacramento, MD  07/23/2018 11:29 AM    [facility]

## 2018-07-29 ENCOUNTER — Other Ambulatory Visit: Payer: Self-pay | Admitting: Family Medicine

## 2018-07-29 DIAGNOSIS — R002 Palpitations: Secondary | ICD-10-CM

## 2018-09-07 ENCOUNTER — Other Ambulatory Visit: Payer: Self-pay

## 2018-09-07 MED ORDER — DEXILANT 60 MG PO CPDR: 60.0000 mg | DELAYED_RELEASE_CAPSULE | Freq: Every day | ORAL | 3 refills | Status: DC

## 2018-09-26 ENCOUNTER — Other Ambulatory Visit: Payer: Self-pay | Admitting: Family Medicine

## 2018-11-13 ENCOUNTER — Telehealth: Payer: Self-pay

## 2018-11-13 NOTE — Telephone Encounter (Signed)
Dr. Caryn Section, have you seen any paperwork for this patient?

## 2018-11-13 NOTE — Telephone Encounter (Signed)
I haven't seen anything.

## 2018-11-13 NOTE — Telephone Encounter (Signed)
Patient called to find out if paperwork she dropped off is ready.

## 2018-11-16 NOTE — Telephone Encounter (Signed)
Patient states that it was a disability tax form that she had dropped back off to our office. Patient states that when she originally brought form you had signed your signature but did not fill out required portion on form. I will check up front and leave in your box is I find form. Kw

## 2018-11-16 NOTE — Telephone Encounter (Signed)
I spoke with carol who states that she placed letter back in your box last week she said there is yellow tab stickers on form where it needs to be signed. KW

## 2018-11-21 ENCOUNTER — Other Ambulatory Visit: Payer: Self-pay | Admitting: Family Medicine

## 2018-11-21 DIAGNOSIS — L930 Discoid lupus erythematosus: Secondary | ICD-10-CM

## 2018-11-23 MED ORDER — HYDROXYCHLOROQUINE SULFATE 200 MG PO TABS: 400.0000 mg | ORAL_TABLET | Freq: Every day | ORAL | 1 refills | Status: DC

## 2018-11-23 NOTE — Addendum Note (Signed)
Addended by: Birdie Sons on: 11/23/2018 01:16 PM   Modules accepted: Orders

## 2018-11-24 ENCOUNTER — Other Ambulatory Visit: Payer: Self-pay | Admitting: Pulmonary Disease

## 2018-11-24 DIAGNOSIS — R911 Solitary pulmonary nodule: Secondary | ICD-10-CM

## 2018-12-07 ENCOUNTER — Other Ambulatory Visit: Payer: Self-pay | Admitting: Family Medicine

## 2018-12-14 ENCOUNTER — Other Ambulatory Visit: Payer: Self-pay

## 2018-12-14 ENCOUNTER — Ambulatory Visit
Admission: RE | Admit: 2018-12-14 | Discharge: 2018-12-14 | Disposition: A | Discharge status: Home / Self Care | Payer: Medicare Other | Source: Ambulatory Visit | Attending: Pulmonary Disease | Admitting: Pulmonary Disease

## 2018-12-14 DIAGNOSIS — R911 Solitary pulmonary nodule: Secondary | ICD-10-CM | POA: Insufficient documentation

## 2018-12-14 DIAGNOSIS — R918 Other nonspecific abnormal finding of lung field: Secondary | ICD-10-CM | POA: Diagnosis not present

## 2018-12-16 ENCOUNTER — Telehealth: Payer: Self-pay | Admitting: Pulmonary Disease

## 2018-12-16 NOTE — Telephone Encounter (Signed)
Spoke to pt, who is requesting CT results from 12/14/2018.  LG please advise. Thanks

## 2018-12-17 NOTE — Telephone Encounter (Signed)
This is a previous patient of Dr. Mathis Fare.  I have not had the opportunity to see this patient yet.  However I have reviewed her CT scan at her request.  It appears that the previous nodules seen have all resolved indicating that these were due to inflammation and/or infection but these are now cleared.  She has COPD changes that are stable.  She will need yearly low-dose CT for lung cancer screening.

## 2018-12-18 NOTE — Telephone Encounter (Signed)
Call made to patient, confirmed DOB, made aware of CT results per CG. Voiced understanding. Nothing further needed at this time. Aware of appt 12/31/2018. Nothing further needed at this time.

## 2018-12-21 ENCOUNTER — Telehealth: Payer: Medicare Other | Admitting: Pulmonary Disease

## 2018-12-22 ENCOUNTER — Ambulatory Visit: Payer: Medicare Other | Admitting: Pulmonary Disease

## 2018-12-31 ENCOUNTER — Telehealth (INDEPENDENT_AMBULATORY_CARE_PROVIDER_SITE_OTHER): Payer: Medicare Other | Admitting: Adult Health

## 2018-12-31 ENCOUNTER — Encounter: Payer: Self-pay | Admitting: Adult Health

## 2018-12-31 DIAGNOSIS — R911 Solitary pulmonary nodule: Secondary | ICD-10-CM

## 2018-12-31 DIAGNOSIS — Z720 Tobacco use: Secondary | ICD-10-CM | POA: Diagnosis not present

## 2018-12-31 DIAGNOSIS — J449 Chronic obstructive pulmonary disease, unspecified: Secondary | ICD-10-CM | POA: Diagnosis not present

## 2018-12-31 MED ORDER — STIOLTO RESPIMAT 2.5-2.5 MCG/ACT IN AERS: 2.0000 | INHALATION_SPRAY | Freq: Every day | RESPIRATORY_TRACT | 5 refills | Status: DC

## 2018-12-31 NOTE — Patient Instructions (Addendum)
May change Anoro to Stiolto 2 puffs daily, rinse after use Refer for low-dose CT screening program, Eric Form (12/2019)  Work on not smoking Follow up with Dr. Duwayne Heck in 4-6 months and As needed

## 2018-12-31 NOTE — Progress Notes (Signed)
Virtual Visit via Video Note  I connected with Salvatore Marvel on 12/31/18 at  4:00 PM EDT by a video enabled telemedicine application and verified that I am speaking with the correct person using two identifiers.  Location: Patient: Home  Provider: Office    I discussed the limitations of evaluation and management by telemedicine and the availability of in person appointments. The patient expressed understanding and agreed to proceed.  History of Present Illness: 53 year old female active smoker followed for moderate COPD and lung nodule  Today's video visit is a follow-up for COPD and lung nodule.  She was last seen January 2020. Patient has a moderate COPD.  Is maintained on Anoro daily.  Says overall breathing is doing okay. Activity level is the same without significant change. Does not like the [facility] , previously on Spriiva . Says the [facility] irritates her mouth . No fever, chest pain , orthopnea or edema.   Patient has a known multiple pulmonary nodules noted on CT chest April 2015.  Follow-up serial CT chest shows stability   A follow-up CT was recently done December 14, 2018 showed moderate emphysema, interval resolution of small pulmonary nodules consistent with resolved infection or inflammation  Active smoker , smokes 1/2 PPD since age 55. (28yr pack hx )    Observations/Objective: Lung nodule. 06/2013 CT high res chest> mild centrilobular emphysema but no ILD, multiple scattered pulm nodules all 56mm in size 07/2014 CT chest > nodule unchanged no further follow up recommended.  -Most recent CT chest 02/23/2018 shows multiple tiny groundglass nodules.  Repeat CT chest in about 9 months.  05/2013 Sleep study [facility] > AHI 3.8   Assessment and Plan: COPD-stable on Anoro   Lung nodules-resolved on CT chest December 14, 2018.   Active smoker-refer to the low-dose CT screening program.smoking cessation   Plan  Patient Instructions  May change Anoro to [facility] 2 puffs daily, rinse  after use Refer for low-dose CT screening program, Eric Form (12/2019)  Work on not smoking Follow up with Dr. Duwayne Heck in 4-6 months and As needed         Follow Up Instructions: Follow-up in 6 months with Dr. Patsey Berthold and as needed    I discussed the assessment and treatment plan with the patient. The patient was provided an opportunity to ask questions and all were answered. The patient agreed with the plan and demonstrated an understanding of the instructions.   The patient was advised to call back or seek an in-person evaluation if the symptoms worsen or if the condition fails to improve as anticipated.  I provided 22  minutes of non-face-to-face time during this encounter.   Rexene Edison, NP

## 2019-04-15 IMAGING — CR DG CHEST 2V
2 series · 2 of 2 positions shown · non-contrast
Comparison: July 29, 2017

CLINICAL DATA: Shortness of breath and chest tightness.  Fever.

EXAM:
CHEST - 2 VIEW

[chest lat]
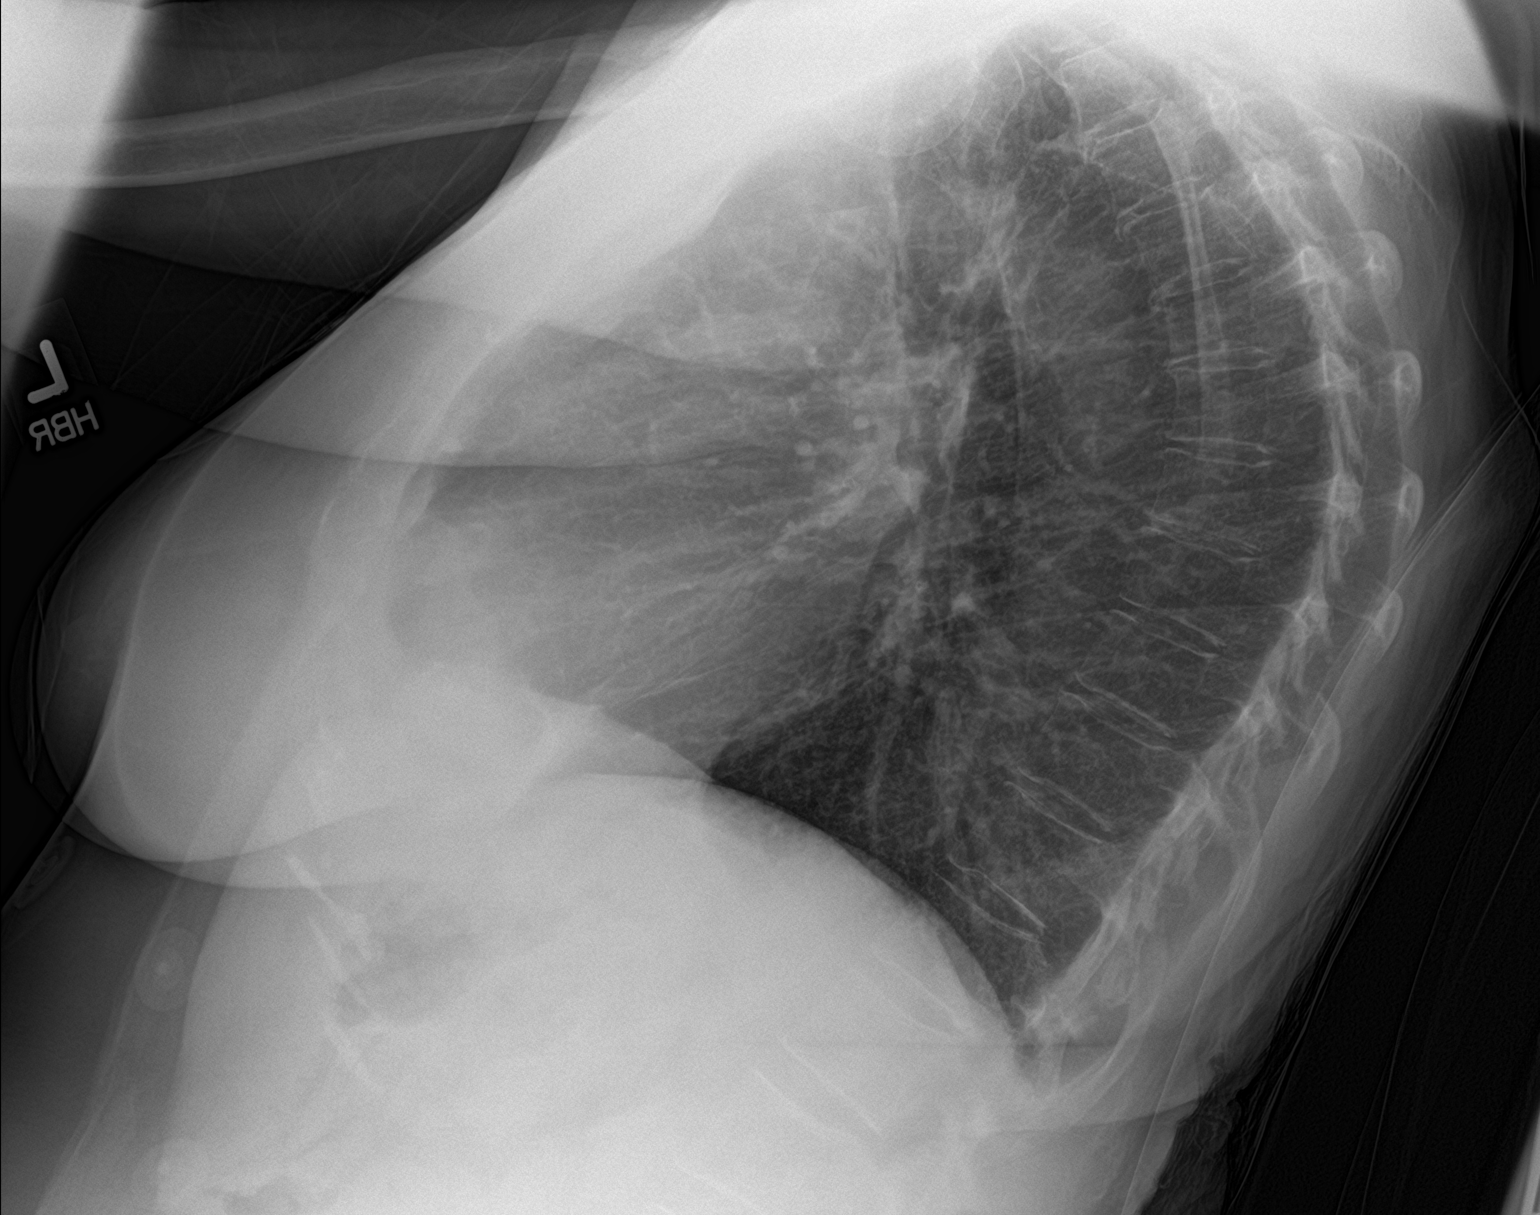

[chest ap]
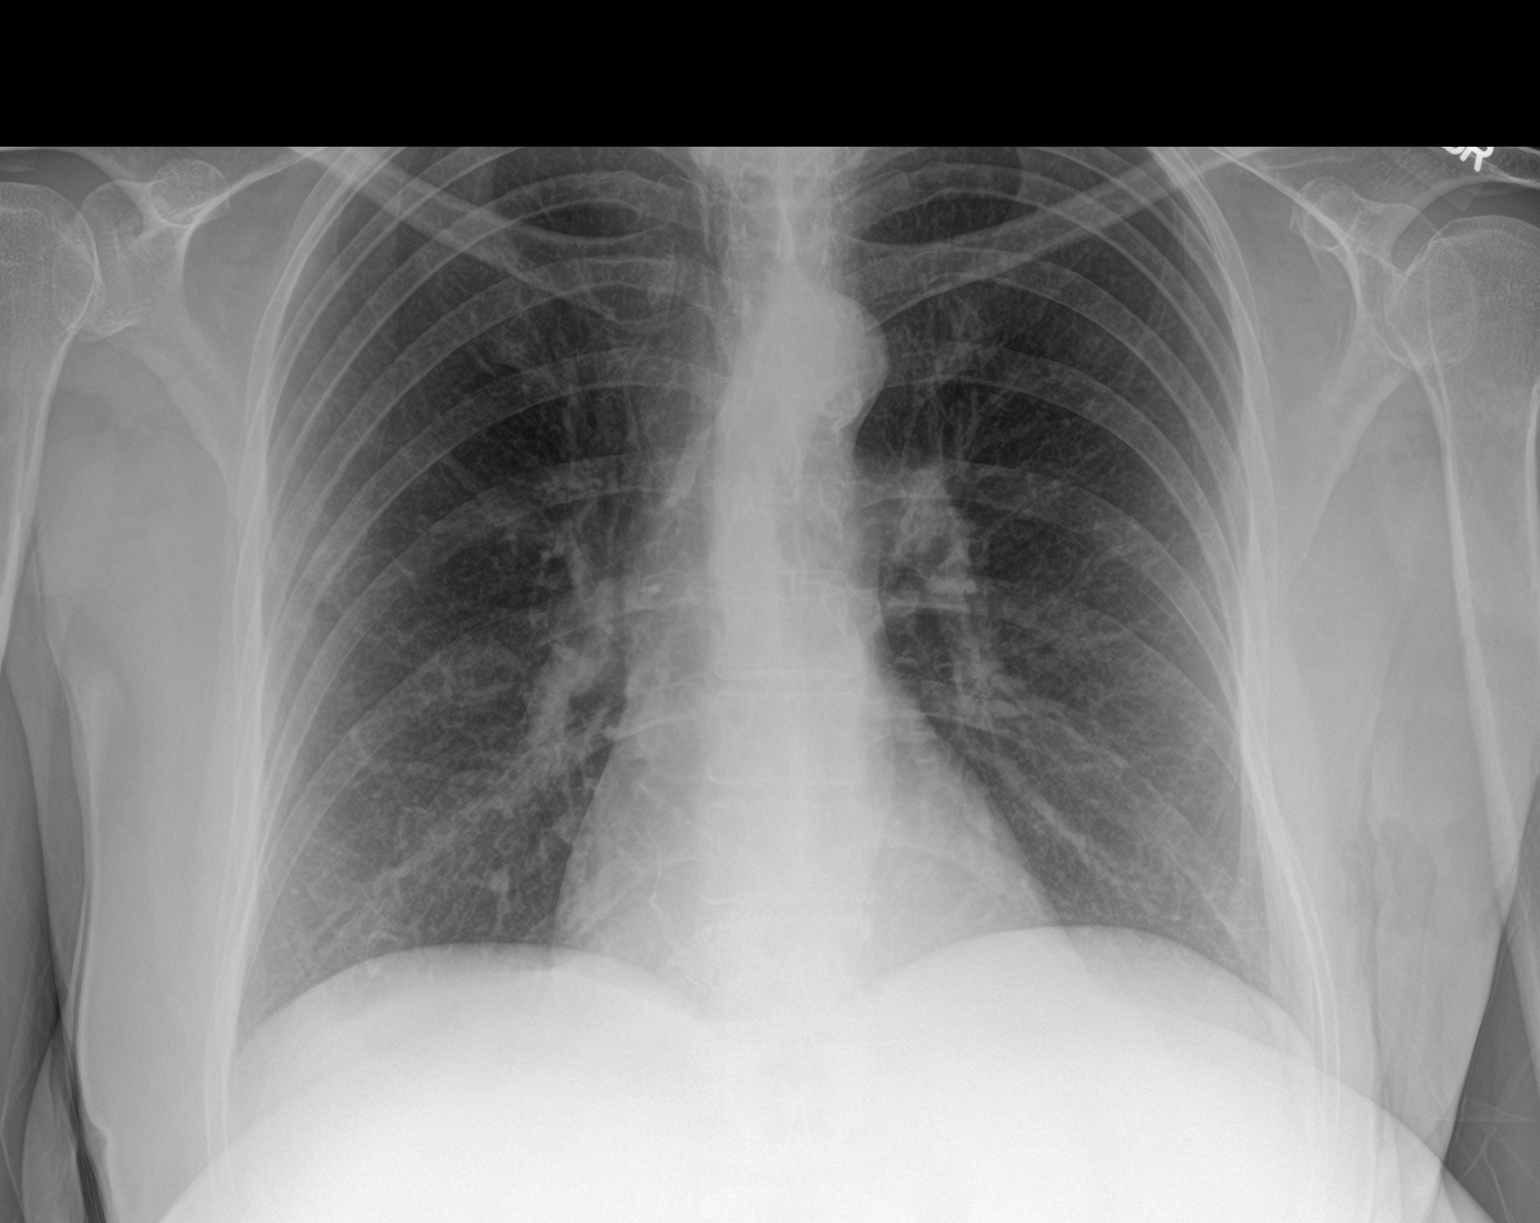

[2 of 2 positions shown; findings below may reference images not displayed]

FINDINGS: Lungs are clear. The heart size and pulmonary vascularity are
normal. No adenopathy. There is aortic atherosclerosis. No bone
lesions. No pneumothorax.
IMPRESSION: Aortic atherosclerosis.  No edema or consolidation.

Aortic Atherosclerosis ([id number].G).

## 2019-05-13 ENCOUNTER — Other Ambulatory Visit: Payer: Self-pay

## 2019-05-13 ENCOUNTER — Ambulatory Visit: Payer: Medicare Other | Admitting: Pulmonary Disease

## 2019-05-13 ENCOUNTER — Encounter: Payer: Self-pay | Admitting: Pulmonary Disease

## 2019-05-13 VITALS — BP 130/76 | HR 79 | Temp 98.2°F | Ht 63.0 in | Wt 166.2 lb

## 2019-05-13 DIAGNOSIS — J441 Chronic obstructive pulmonary disease with (acute) exacerbation: Secondary | ICD-10-CM

## 2019-05-13 DIAGNOSIS — J449 Chronic obstructive pulmonary disease, unspecified: Secondary | ICD-10-CM

## 2019-05-13 DIAGNOSIS — F17210 Nicotine dependence, cigarettes, uncomplicated: Secondary | ICD-10-CM

## 2019-05-13 MED ORDER — BREZTRI AEROSPHERE 160-9-4.8 MCG/ACT IN AERO: 2.0000 | INHALATION_SPRAY | Freq: Two times a day (BID) | RESPIRATORY_TRACT | 0 refills | Status: DC

## 2019-05-13 MED ORDER — BREZTRI AEROSPHERE 160-9-4.8 MCG/ACT IN AERO: 2.0000 | INHALATION_SPRAY | Freq: Two times a day (BID) | RESPIRATORY_TRACT | 11 refills | Status: DC

## 2019-05-13 MED ORDER — ALBUTEROL SULFATE HFA 108 (90 BASE) MCG/ACT IN AERS: 2.0000 | INHALATION_SPRAY | Freq: Four times a day (QID) | RESPIRATORY_TRACT | 6 refills | Status: DC | PRN

## 2019-05-13 NOTE — Patient Instructions (Signed)
We are switching her medication to [facility] 2 inhalations twice a day  We have sent in a prescription for your rescue inhaler  We will see him in follow-up in 3 months time. We will notify you of the results of your hereditary emphysema test.

## 2019-05-13 NOTE — Progress Notes (Signed)
 Assessment & Plan:  1. COPD, GOLD B (Primary)  2. Tobacco dependence due to cigarettes  3. COPD exacerbation [facility])   Patient Instructions  We are switching her medication to Breztri  2 inhalations twice a day  We have sent in a prescription for your rescue inhaler  We will see him in follow-up in 3 months time. We will notify you of the results of your hereditary emphysema test.  Please note: late entry documentation due to logistical difficulties during COVID-19 pandemic. This note is filed for information purposes only, and is not intended to be used for billing, nor does it represent the full scope/nature of the visit in question. Please see any associated scanned media linked to date of encounter for additional pertinent information.  Subjective:    HPI: Andrea [name] is a 54 y.o. female presenting to the pulmonology clinic on 05/13/2019 with report of: Follow-up (former DR pt-- pt states since pollen has started she has had to use neb BID. c/o sob with exertion, prod cough with clear mucus and wheezing mainly at night. )     Outpatient Encounter Medications as of 05/13/2019  Medication Sig Note   nitroGLYCERIN  (NITROSTAT ) 0.4 MG SL tablet Place 1 tablet (0.4 mg total) under the tongue every 5 (five) minutes as needed for chest pain.    Respiratory Therapy Supplies (FLUTTER) DEVI 1 Device by Does not apply route daily.    [DISCONTINUED] albuterol  (PROVENTIL  HFA;VENTOLIN  HFA) 108 (90 Base) MCG/ACT inhaler Inhale 2 puffs into the lungs every 6 (six) hours as needed for wheezing or shortness of breath.    [DISCONTINUED] albuterol  (PROVENTIL ) (2.5 MG/3ML) 0.083% nebulizer solution Take 3 mLs (2.5 mg total) by nebulization every 4 (four) hours as needed for wheezing or shortness of breath. Reported on 03/19/2015 (Patient not taking: Reported on 10/08/2019)    [DISCONTINUED] albuterol  (VENTOLIN  HFA) 108 (90 Base) MCG/ACT inhaler Inhale 2 puffs into the lungs every 6 (six) hours as  needed for wheezing or shortness of breath.    [DISCONTINUED] ALPRAZolam  (XANAX ) 1 MG tablet TAKE 1 TABLET BY MOUTH EVERY 8 HOURS AS NEEDED (Patient not taking: Reported on 01/12/2020) 01/17/2020: no longer needed   [DISCONTINUED] ARIPiprazole  (ABILIFY ) 5 MG tablet Take 5 mg by mouth daily as needed (anxiety and depression).  (Patient not taking: Reported on 01/13/2020) 01/17/2020: previously discontinued   [DISCONTINUED] clonazePAM  (KLONOPIN ) 1 MG tablet TAKE 1 TABLET (1 MG TOTAL) BY MOUTH 2 (TWO) TIMES DAILY AS NEEDED.    [DISCONTINUED] dexlansoprazole  (DEXILANT ) 60 MG capsule Take 1 capsule (60 mg total) by mouth daily.    [DISCONTINUED] hydroxychloroquine  (PLAQUENIL ) 200 MG tablet Take 2 tablets (400 mg total) by mouth daily. For Discoid Lupus (L93.0)    [DISCONTINUED] propranolol  (INDERAL ) 20 MG tablet Take 1 tablet (20 mg total) by mouth 2 (two) times daily. (BETA BLOCKER) (Patient taking differently: Take 20 mg by mouth daily. Taking 1 tablet daily (BETA BLOCKER))    [DISCONTINUED] Tiotropium Bromide -Olodaterol (STIOLTO RESPIMAT ) 2.5-2.5 MCG/ACT AERS Inhale 2 puffs into the lungs daily.    [DISCONTINUED] Budeson-Glycopyrrol-Formoterol  (BREZTRI  AEROSPHERE) 160-9-4.8 MCG/ACT AERO Inhale 2 puffs into the lungs in the morning and at bedtime. (Patient not taking: Reported on 10/08/2019)    [DISCONTINUED] Budeson-Glycopyrrol-Formoterol  (BREZTRI  AEROSPHERE) 160-9-4.8 MCG/ACT AERO Inhale 2 puffs into the lungs in the morning and at bedtime. 09/29/2020: was previously changed to Trelegy by Dr. Isaiah   No facility-administered encounter medications on file as of 05/13/2019.      Objective:   Vitals:  05/13/19 0835  BP: 130/76  Pulse: 79  Temp: 98.2 F (36.8 C)  Height: 5' 3 (1.6 m)  Weight: 166 lb 3.2 oz (75.4 kg)  SpO2: 98%  TempSrc: Temporal  BMI (Calculated): 29.45     Physical exam documentation is limited by delayed entry of information.

## 2019-06-04 ENCOUNTER — Encounter: Payer: Self-pay | Admitting: Family Medicine

## 2019-06-04 ENCOUNTER — Ambulatory Visit (INDEPENDENT_AMBULATORY_CARE_PROVIDER_SITE_OTHER): Payer: Medicare Other | Admitting: Family Medicine

## 2019-06-04 ENCOUNTER — Ambulatory Visit: Payer: Medicare Other | Admitting: Physician Assistant

## 2019-06-04 ENCOUNTER — Other Ambulatory Visit: Payer: Self-pay

## 2019-06-04 ENCOUNTER — Telehealth: Payer: Self-pay | Admitting: Family Medicine

## 2019-06-04 VITALS — BP 124/82 | HR 91 | Temp 96.9°F | Resp 18 | Ht 63.0 in | Wt 167.0 lb

## 2019-06-04 DIAGNOSIS — I10 Essential (primary) hypertension: Secondary | ICD-10-CM

## 2019-06-04 DIAGNOSIS — R300 Dysuria: Secondary | ICD-10-CM | POA: Diagnosis not present

## 2019-06-04 DIAGNOSIS — R0602 Shortness of breath: Secondary | ICD-10-CM | POA: Diagnosis not present

## 2019-06-04 DIAGNOSIS — J449 Chronic obstructive pulmonary disease, unspecified: Secondary | ICD-10-CM

## 2019-06-04 DIAGNOSIS — H1012 Acute atopic conjunctivitis, left eye: Secondary | ICD-10-CM | POA: Diagnosis not present

## 2019-06-04 LAB — POCT URINALYSIS DIPSTICK
Appearance: NORMAL
Bilirubin, UA: NEGATIVE
Blood, UA: NEGATIVE
Glucose, UA: NEGATIVE
Ketones, UA: NEGATIVE
Leukocytes, UA: NEGATIVE
Nitrite, UA: NEGATIVE
Odor: NORMAL
Protein, UA: NEGATIVE
Spec Grav, UA: 1.01 (ref 1.010–1.025)
Urobilinogen, UA: 0.2 E.U./dL
pH, UA: 7 (ref 5.0–8.0)

## 2019-06-04 MED ORDER — AZELASTINE HCL 0.05 % OP SOLN: 1.0000 [drp] | Freq: Two times a day (BID) | OPHTHALMIC | 12 refills | Status: DC

## 2019-06-04 NOTE — Patient Instructions (Signed)
.   Please review the attached list of medications and notify my office if there are any errors.   Please contact your eyecare professional to schedule a routine eye exam  . You are due for a colonoscopy due to history of pre-cancerous polyps. You should also have this done since you have been having some lower abdominal pains

## 2019-06-04 NOTE — Progress Notes (Signed)
Established patient visit      Patient: Andrea [name]   DOB: 12-12-1965   54 y.o. Female  MRN: [id number] Visit Date: 06/04/2019  Today's healthcare provider: Lelon Huh, MD  Subjective:    Chief Complaint  Patient presents with  . Follow-up   HPI  Patient is here to follow up and get her labs done. Patient also states she is having pelvic pain left more than right and burning upon urination 2-3 weeks.   Also patient is having left eye pain and redness off and on for about 2 weeks.    Past Medical History:  Diagnosis Date  . Anxiety    panic attacks, chest pain  . Arthritis   . COPD (chronic obstructive pulmonary disease) ([place])   . GERD (gastroesophageal reflux disease)   . Hypertension    not medicated  . Non-obstructive CAD in native artery    a. cardiac cath 01/2014: showed minor irregularities, mildly elevated left ventricular end-diastolic pressure and normal ejection fraction; b. 03/2015 MV: low risk.  . Palpitations    a. 08/2016 Event Monitor: occas PVC's, two brief runs of SVT (4 and 7 beats).  Marland Kitchen PONV (postoperative nausea and vomiting)   . Symptomatic PVC's (premature ventricular contractions)   . Tobacco abuse   . Wears dentures    partial upper and lower       Medications: Outpatient Medications Prior to Visit  Medication Sig  . albuterol (PROVENTIL) (2.5 MG/3ML) 0.083% nebulizer solution Take 3 mLs (2.5 mg total) by nebulization every 4 (four) hours as needed for wheezing or shortness of breath. Reported on 03/19/2015  . ALPRAZolam (XANAX) 1 MG tablet TAKE 1 TABLET BY MOUTH EVERY 8 HOURS AS NEEDED  . ARIPiprazole (ABILIFY) 5 MG tablet Take 5 mg by mouth daily as needed (anxiety and depression).   . Budeson-Glycopyrrol-Formoterol (BREZTRI AEROSPHERE) 160-9-4.8 MCG/ACT AERO Inhale 2 puffs into the lungs in the morning and at bedtime.  . clonazePAM (KLONOPIN) 1 MG tablet TAKE 1 TABLET (1 MG TOTAL) BY MOUTH 2 (TWO) TIMES DAILY AS NEEDED.  Marland Kitchen  dexlansoprazole (DEXILANT) 60 MG capsule Take 1 capsule (60 mg total) by mouth daily.  . hydroxychloroquine (PLAQUENIL) 200 MG tablet Take 2 tablets (400 mg total) by mouth daily. For Discoid Lupus (L93.0)  . nitroGLYCERIN (NITROSTAT) 0.4 MG SL tablet Place 1 tablet (0.4 mg total) under the tongue every 5 (five) minutes as needed for chest pain.  Marland Kitchen propranolol (INDERAL) 20 MG tablet Take 1 tablet (20 mg total) by mouth 2 (two) times daily. (BETA BLOCKER) (Patient taking differently: Take 20 mg by mouth daily. (BETA BLOCKER))  . Respiratory Therapy Supplies (FLUTTER) DEVI 1 Device by Does not apply route daily.  Marland Kitchen albuterol (VENTOLIN HFA) 108 (90 Base) MCG/ACT inhaler Inhale 2 puffs into the lungs every 6 (six) hours as needed for wheezing or shortness of breath. (Patient not taking: Reported on 06/04/2019)  . Budeson-Glycopyrrol-Formoterol (BREZTRI AEROSPHERE) 160-9-4.8 MCG/ACT AERO Inhale 2 puffs into the lungs in the morning and at bedtime. (Patient not taking: Reported on 06/04/2019)   No facility-administered medications prior to visit.    Review of Systems  Constitutional: Negative for appetite change, chills, fatigue and fever.  Respiratory: Negative for chest tightness and shortness of breath.   Cardiovascular: Negative for chest pain and palpitations.  Gastrointestinal: Negative for abdominal pain, nausea and vomiting.  Neurological: Negative for dizziness and weakness.       Objective:    BP 124/82 (BP Location:  Left Arm, Patient Position: Sitting, Cuff Size: Large)   Pulse 91   Temp (!) 96.9 F (36.1 C) (Other (Comment))   Resp 18   Ht 5\' 3"  (1.6 m)   Wt 167 lb (75.8 kg)   SpO2 97%   BMI 29.58 kg/m     Physical Exam   General: Appearance:     Overweight female in no acute distress  Eyes:    PERRL, conjunctiva/corneas clear, EOM's intact       Lungs:     Clear to auscultation bilaterally, respirations unlabored  Heart:    Normal heart rate. Normal rhythm. No murmurs,  rubs, or gallops.   MS:   All extremities are intact.   Abd:  non tender, non distended, no masses.        Results for orders placed or performed in visit on 06/04/19  POCT urinalysis dipstick  Result Value Ref Range   Color, UA Yellow    Clarity, UA Clear    Glucose, UA Negative Negative   Bilirubin, UA Negative    Ketones, UA Negative    Spec Grav, UA 1.010 1.010 - 1.025   Blood, UA Negative    pH, UA 7.0 5.0 - 8.0   Protein, UA Negative Negative   Urobilinogen, UA 0.2 0.2 or 1.0 E.U./dL   Nitrite, UA Negative    Leukocytes, UA Negative Negative   Appearance Normal    Odor Normal       Assessment & Plan:    1. Dysuria Normal u/a. She is having intermittent lower abdominal pain. She is due for follow up with Dr. Allen Norris which she states she is planning to schedule soon. Consider abdominal imaging if pain persists and labs are normal.   2. Essential (primary) hypertension Stable, Continue current medications.   - Comprehensive metabolic panel - Lipid panel - CBC - TSH  3. COPD, GOLD B Dr. Patsey Berthold had ordered- Alpha-1 antitrypsin phenotype but patient would like to have it done on labs drawn her.   4. Shortness of breath Secondary to COPD.   5. Allergic conjunctivitis of left eye try - azelastine (OPTIVAR) 0.05 % ophthalmic solution; Place 1 drop into the left eye 2 (two) times daily.  Dispense: 6 mL; Refill: 12     The entirety of the information documented in the History of Present Illness, Review of Systems and Physical Exam were personally obtained by me. Portions of this information were initially documented by the CMA and reviewed by me for thoroughness and accuracy.  Lelon Huh, MD     Lelon Huh, MD  [facility] [phone] (phone) [phone] (fax)  [place]

## 2019-06-04 NOTE — Telephone Encounter (Signed)
Patient calling to inquire if Dr. Caryn Section would be sending eye drops that were discussed at today's visit into pharmacy.

## 2019-06-04 NOTE — Telephone Encounter (Signed)
Yes, it was sent to [facility]

## 2019-06-07 NOTE — Telephone Encounter (Signed)
Patient advised.

## 2019-06-08 DIAGNOSIS — I10 Essential (primary) hypertension: Secondary | ICD-10-CM | POA: Diagnosis not present

## 2019-06-08 DIAGNOSIS — J449 Chronic obstructive pulmonary disease, unspecified: Secondary | ICD-10-CM | POA: Diagnosis not present

## 2019-06-10 ENCOUNTER — Telehealth: Payer: Self-pay

## 2019-06-10 NOTE — Telephone Encounter (Signed)
Copied from [place] [id number]. Topic: General - Inquiry >> Jun 10, 2019 11:48 AM Andrea [name] wrote: Reason for CRM:  Patient called in for lab results . Please advise

## 2019-06-11 NOTE — Telephone Encounter (Signed)
Advised patient of message below and reviewed in detail with her values from Lipid Panel. Patient states that she has seen labs and had concerns of RBC being elevated, she also wanted to know at this time is there anything she needs to do about elevated cholesterol? KW

## 2019-06-11 NOTE — Telephone Encounter (Signed)
Her cholesterol is a little high,  Results of alpha 1 antitrypsin test are still pending.

## 2019-06-14 LAB — LIPID PANEL
Chol/HDL Ratio: 5.3 ratio — ABNORMAL HIGH (ref 0.0–4.4)
Cholesterol, Total: 206 mg/dL — ABNORMAL HIGH (ref 100–199)
HDL: 39 mg/dL — ABNORMAL LOW (ref >39)
LDL Chol Calc (NIH): 142 mg/dL — ABNORMAL HIGH (ref 0–99)
Triglycerides: 138 mg/dL (ref 0–149)
VLDL Cholesterol Cal: 25 mg/dL (ref 5–40)

## 2019-06-14 LAB — CBC
Hematocrit: 44 % (ref 34.0–46.6)
Hemoglobin: 14.2 g/dL (ref 11.1–15.9)
MCH: 25.7 pg — ABNORMAL LOW (ref 26.6–33.0)
MCHC: 32.3 g/dL (ref 31.5–35.7)
MCV: 80 fL (ref 79–97)
Platelets: 281 10*3/uL (ref 150–450)
RBC: 5.52 x10E6/uL — ABNORMAL HIGH (ref 3.77–5.28)
RDW: 14.4 % (ref 11.7–15.4)
WBC: 6.8 10*3/uL (ref 3.4–10.8)

## 2019-06-14 LAB — COMPREHENSIVE METABOLIC PANEL
ALT: 13 IU/L (ref 0–32)
AST: 13 IU/L (ref 0–40)
Albumin/Globulin Ratio: 1.5 (ref 1.2–2.2)
Albumin: 4.3 g/dL (ref 3.8–4.9)
Alkaline Phosphatase: 110 IU/L (ref 39–117)
BUN/Creatinine Ratio: 21 (ref 9–23)
BUN: 16 mg/dL (ref 6–24)
Bilirubin Total: 0.3 mg/dL (ref 0.0–1.2)
CO2: 26 mmol/L (ref 20–29)
Calcium: 10 mg/dL (ref 8.7–10.2)
Chloride: 98 mmol/L (ref 96–106)
Creatinine, Ser: 0.75 mg/dL (ref 0.57–1.00)
GFR calc Af Amer: 104 mL/min/{1.73_m2} (ref >59)
GFR calc non Af Amer: 90 mL/min/{1.73_m2} (ref >59)
Globulin, Total: 2.8 g/dL (ref 1.5–4.5)
Glucose: 95 mg/dL (ref 65–99)
Potassium: 5.2 mmol/L (ref 3.5–5.2)
Sodium: 139 mmol/L (ref 134–144)
Total Protein: 7.1 g/dL (ref 6.0–8.5)

## 2019-06-14 LAB — ALPHA-1 ANTITRYPSIN PHENOTYPE: A-1 Antitrypsin: 150 mg/dL (ref 101–187)

## 2019-06-14 LAB — TSH: TSH: 2.8 u[IU]/mL (ref 0.450–4.500)

## 2019-06-14 NOTE — Telephone Encounter (Signed)
Has been over a week, and have still not received results of alpha-1 antitrypsin phenotype, please call [facility] and see what the problem is.  Thanks.

## 2019-06-14 NOTE — Telephone Encounter (Signed)
Please advise patient that results of alpha-1 antitrypsin tests are completed and I have forwarded them to Dr. Patsey Berthold to interpret them.   Her cholesterol is not quite high enough to require additional medications, but she should eliminate saturated fats such as red meat from her diet as much as possible.  Red blood cells run high in smokers because they are starved for oxygen, so the body makes more of them to make up for it. Her RBC cell count is typical for a smoker.

## 2019-06-14 NOTE — Telephone Encounter (Signed)
I called [facility] and spoke with agent named Anderson Malta. Was advised that test result should be in tomorrow.

## 2019-06-15 NOTE — Telephone Encounter (Signed)
Patient advised.

## 2019-06-24 ENCOUNTER — Other Ambulatory Visit: Payer: Self-pay

## 2019-06-24 ENCOUNTER — Encounter: Payer: Self-pay | Admitting: Cardiovascular Disease

## 2019-06-24 ENCOUNTER — Ambulatory Visit (INDEPENDENT_AMBULATORY_CARE_PROVIDER_SITE_OTHER): Payer: Medicare Other | Admitting: Cardiovascular Disease

## 2019-06-24 VITALS — BP 130/88 | HR 86 | Ht 65.0 in | Wt 166.0 lb

## 2019-06-24 DIAGNOSIS — I10 Essential (primary) hypertension: Secondary | ICD-10-CM

## 2019-06-24 DIAGNOSIS — I493 Ventricular premature depolarization: Secondary | ICD-10-CM

## 2019-06-24 NOTE — Patient Instructions (Signed)
Medication Instructions:  Your physician recommends that you continue on your current medications as directed. Please refer to the Current Medication list given to you today.  *If you need a refill on your cardiac medications before your next appointment, please call your pharmacy*   Lab Work: None ordered If you have labs (blood work) drawn today and your tests are completely normal, you will receive your results only by: . MyChart Message (if you have MyChart) OR . A paper copy in the mail If you have any lab test that is abnormal or we need to change your treatment, we will call you to review the results.   Testing/Procedures: None ordered   Follow-Up: At [facility], you and your health needs are our priority.  As part of our continuing mission to provide you with exceptional heart care, we have created designated Provider Care Teams.  These Care Teams include your primary Cardiologist (physician) and Advanced Practice Providers (APPs -  Physician Assistants and Nurse Practitioners) who all work together to provide you with the care you need, when you need it.  We recommend signing up for the patient portal called "MyChart".  Sign up information is provided on this After Visit Summary.  MyChart is used to connect with patients for Virtual Visits (Telemedicine).  Patients are able to view lab/test results, encounter notes, upcoming appointments, etc.  Non-urgent messages can be sent to your provider as well.   To learn more about what you can do with MyChart, go to [email or web address].    Your next appointment:   12 month(s)  The format for your next appointment:   In Person  Provider:    You may see Dr. Arida or one of the following Advanced Practice Providers on your designated Care Team:    Christopher Berge, NP  Ryan Dunn, PA-C  Jacquelyn Visser, PA-C    Other Instructions N/A  

## 2019-06-24 NOTE — Progress Notes (Signed)
Cardiology Office Note   Date:  06/24/2019   ID:  Andrea [name], Andrea [name] Apr 15, 1965, MRN [id number]  PCP:  Birdie Sons, MD  Cardiologist:   Kathlyn Sacramento, MD   Chief Complaint  Patient presents with   office visit    Pt concern w/ PVCs. Meds verbally reviewed w/ pt.      History of Present Illness: Andrea [name] is a 54 y.o. female who presents for a follow-up visit regarding symptomatic PVCs. She has known history of minor nonobstructive coronary artery disease on previous cardiac catheterization in November 2015. Other medical problems include tobacco use, hypertension, anxiety with panic attacks. She has been seen by Dr. Caryl Comes for symptomatic PVCs and tried multiple antiarrhythmic medications with no improvement.  She was referred to Dr. Rayann Heman to evaluate for ablation but it was felt that chance of success was low given that the burden was not consistent.  She is using propranolol with reasonable control.  Since her last visit, she has been following up with pulmonary and was diagnosed with COPD.  She continues to have intermittent palpitations with no recent worsening.  No chest pain .     Past Medical History:  Diagnosis Date   Anxiety    panic attacks, chest pain   Arthritis    COPD (chronic obstructive pulmonary disease) ([facility])    GERD (gastroesophageal reflux disease)    Hypertension    not medicated   Non-obstructive CAD in native artery    a. cardiac cath 01/2014: showed minor irregularities, mildly elevated left ventricular end-diastolic pressure and normal ejection fraction; b. 03/2015 MV: low risk.   Palpitations    a. 08/2016 Event Monitor: occas PVC's, two brief runs of SVT (4 and 7 beats).   PONV (postoperative nausea and vomiting)    Symptomatic PVC's (premature ventricular contractions)    Tobacco abuse    Wears dentures    partial upper and lower    Past Surgical History:  Procedure Laterality Date   CARDIAC CATHETERIZATION   01/03/14   CARDIAC CATHETERIZATION     ESOPHAGOGASTRODUODENOSCOPY (EGD) WITH PROPOFOL N/A 07/11/2016   Procedure: ESOPHAGOGASTRODUODENOSCOPY (EGD) WITH PROPOFOL;  Surgeon: Lucilla Lame, MD;  Location: [place];  Service: Endoscopy;  Laterality: N/A;   EYE SURGERY     IMAGE GUIDED SINUS SURGERY N/A 11/30/2014   Procedure: IMAGE GUIDED SINUS SURGERY;  Surgeon: Carloyn Manner, MD;  Location: [place];  Service: ENT;  Laterality: N/A;  GAVE DISK TO CE CE   MAXILLARY ANTROSTOMY Bilateral 11/30/2014   Procedure: MAXILLARY ANTROSTOMY;  Surgeon: Carloyn Manner, MD;  Location: [place];  Service: ENT;  Laterality: Bilateral;   SEPTOPLASTY N/A 11/30/2014   Procedure: SEPTOPLASTY;  Surgeon: Carloyn Manner, MD;  Location: [place];  Service: ENT;  Laterality: N/A;   SPHENOIDECTOMY Left 11/30/2014   Procedure: Coralee Pesa, ;  Surgeon: Carloyn Manner, MD;  Location: [place];  Service: ENT;  Laterality: Left;   VAGINAL HYSTERECTOMY  1995   vaginal     Current Outpatient Medications  Medication Sig Dispense Refill   albuterol (PROVENTIL) (2.5 MG/3ML) 0.083% nebulizer solution Take 3 mLs (2.5 mg total) by nebulization every 4 (four) hours as needed for wheezing or shortness of breath. Reported on 03/19/2015 75 mL 12   albuterol (VENTOLIN HFA) 108 (90 Base) MCG/ACT inhaler Inhale 2 puffs into the lungs every 6 (six) hours as needed for wheezing or shortness of breath. 18 g 6   ALPRAZolam (XANAX) 1  MG tablet TAKE 1 TABLET BY MOUTH EVERY 8 HOURS AS NEEDED 60 tablet 5   ARIPiprazole (ABILIFY) 5 MG tablet Take 5 mg by mouth daily as needed (anxiety and depression).      azelastine (OPTIVAR) 0.05 % ophthalmic solution Place 1 drop into the left eye 2 (two) times daily. 6 mL 12   Budeson-Glycopyrrol-Formoterol (BREZTRI AEROSPHERE) 160-9-4.8 MCG/ACT AERO Inhale 2 puffs into the lungs in the morning and at bedtime. 10.7 g 11    Budeson-Glycopyrrol-Formoterol (BREZTRI AEROSPHERE) 160-9-4.8 MCG/ACT AERO Inhale 2 puffs into the lungs in the morning and at bedtime. 10.7 g 0   clonazePAM (KLONOPIN) 1 MG tablet TAKE 1 TABLET (1 MG TOTAL) BY MOUTH 2 (TWO) TIMES DAILY AS NEEDED. 60 tablet 5   dexlansoprazole (DEXILANT) 60 MG capsule Take 1 capsule (60 mg total) by mouth daily. 90 capsule 3   hydroxychloroquine (PLAQUENIL) 200 MG tablet Take 2 tablets (400 mg total) by mouth daily. For Discoid Lupus (L93.0) 180 tablet 1   nitroGLYCERIN (NITROSTAT) 0.4 MG SL tablet Place 1 tablet (0.4 mg total) under the tongue every 5 (five) minutes as needed for chest pain. 25 tablet 0   propranolol (INDERAL) 20 MG tablet Take 1 tablet (20 mg total) by mouth 2 (two) times daily. (BETA BLOCKER) (Patient taking differently: Take 20 mg by mouth daily. Taking 1 tablet daily (BETA BLOCKER)) 60 tablet 4   Respiratory Therapy Supplies (FLUTTER) DEVI 1 Device by Does not apply route daily. 1 each 0   No current facility-administered medications for this visit.    Allergies:   Alum & mag hydroxide-simeth, Aspirin, Cefdinir, Chantix [varenicline], Doxycycline, Multaq [dronedarone], and Ivp dye [iodinated diagnostic agents]    Social History:  The patient  reports that she has been smoking cigarettes. She has a 24.00 pack-year smoking history. She has never used smokeless tobacco. She reports that she does not drink alcohol or use drugs.   Family History:  The patient's family history includes Arthritis in her father; Asthma in her father; Colon cancer in her father; Emphysema in her father; Heart attack in her father; Heart disease in her father; Hypertension in her mother.    ROS:  Please see the history of present illness.   Otherwise, review of systems are positive for none.   All other systems are reviewed and negative.    PHYSICAL EXAM: VS:  BP 130/88 (BP Location: Left Arm, Patient Position: Sitting, Cuff Size: Normal)    Pulse 86    Ht 5'  5" (1.651 m)    Wt 166 lb (75.3 kg)    SpO2 96%    BMI 27.62 kg/m  , BMI Body mass index is 27.62 kg/m. GEN: Well nourished, well developed, in no acute distress  HEENT: normal  Neck: no JVD, carotid bruits, or masses Cardiac: RRR; no murmurs, rubs, or gallops,no edema  Respiratory:  clear to auscultation bilaterally, normal work of breathing GI: soft, nontender, nondistended, + BS MS: no deformity or atrophy  Skin: warm and dry, no rash Neuro:  Strength and sensation are intact Psych: euthymic mood, full affect   EKG:  EKG is ordered today. The ekg ordered today demonstrates normal sinus rhythm with anterior T wave changes suggestive of ischemia.   Recent Labs: 06/08/2019: ALT 13; BUN 16; Creatinine, Ser 0.75; Hemoglobin 14.2; Platelets 281; Potassium 5.2; Sodium 139; TSH 2.800    Lipid Panel    Component Value Date/Time   CHOL 206 (H) 06/08/2019 0727   TRIG 138 06/08/2019  0727   HDL 39 (L) 06/08/2019 0727   CHOLHDL 5.3 (H) 06/08/2019 0727   CHOLHDL 5.7 03/19/2015 0812   VLDL 20 03/19/2015 0812   LDLCALC 142 (H) 06/08/2019 0727      Wt Readings from Last 3 Encounters:  06/24/19 166 lb (75.3 kg)  06/04/19 167 lb (75.8 kg)  05/13/19 166 lb 3.2 oz (75.4 kg)        No flowsheet data found.    ASSESSMENT AND PLAN:  1. Symptomatic PVCs: She continues to have intermittent palpitations.  However, symptoms are reasonably controlled with propranolol.  I do not see any evidence of PVCs on EKG or by physical exam today.  2. Tobacco use: I discussed the importance of smoking cessation.  3.  COPD: She is followed with pulmonary now.    Disposition:   FU with in 1 year  Signed,  Kathlyn Sacramento, MD  06/24/2019 5:11 PM    [place]

## 2019-07-17 ENCOUNTER — Other Ambulatory Visit: Payer: Self-pay | Admitting: Family Medicine

## 2019-07-17 NOTE — Telephone Encounter (Signed)
Requested medication (s) are due for refill today: yes  Requested medication (s) are on the active medication list: yes  Last refill:  clonazepam: 12/08/18        Plaquenil:11/23/18  Future visit scheduled: no  Notes to clinic:  medications requested not delegated to NT to RF   Requested Prescriptions  Pending Prescriptions Disp Refills   clonazePAM (KLONOPIN) 1 MG tablet [Pharmacy Med Name: CLONAZEPAM 1 MG TABLET] 60 tablet     Sig: TAKE 1 TABLET (1 MG TOTAL) BY MOUTH 2 (TWO) TIMES DAILY AS NEEDED.      Not Delegated - Psychiatry:  Anxiolytics/Hypnotics Failed - 07/17/2019  8:44 AM      Failed - This refill cannot be delegated      Failed - Urine Drug Screen completed in last 360 days.      Passed - Valid encounter within last 6 months    Recent Outpatient Visits           1 month ago [place]ier, Donald E, MD   1 year ago Acute recurrent pansinusitis   [place], Clearnce Sorrel, [place]   1 year ago Chest pain, unspecified type   [facility] Birdie Sons, MD   1 year ago Acute maxillary sinusitis, recurrence not specified   [facility] Birdie Sons, MD   2 years ago Cough   [facility] Birdie Sons, MD                hydroxychloroquine (PLAQUENIL) 200 MG tablet [Pharmacy Med Name: HYDROXYCHLOROQUINE 200 MG TAB] 180 tablet 1    Sig: Take 2 tablets (400 mg total) by mouth daily. For Discoid Lupus (L93.0)      Not Delegated - Analgesics:  Antirheumatic Agents - abatacept & hydroxychloroquine Failed - 07/17/2019  8:44 AM      Failed - This refill cannot be delegated      Passed - Valid encounter within last 6 months    Recent Outpatient Visits           1 month ago Naschitti, Donald E, MD   1 year ago Acute recurrent pansinusitis   [facility] Mar Daring, [place]   1 year ago Chest pain, unspecified type    [facility] Birdie Sons, MD   1 year ago Acute maxillary sinusitis, recurrence not specified   [facility] Birdie Sons, MD   2 years ago Cough   [facility] Birdie Sons, MD

## 2019-08-10 NOTE — Progress Notes (Signed)
Subjective:   Andrea CHEESE is a 54 y.o. female who presents for an Initial Medicare Annual Wellness Visit.  I connected with Andrea [name] today by telephone and verified that I am speaking with the correct person using two identifiers. Location patient: home Location provider: work Persons participating in the virtual visit: patient, provider.   I discussed the limitations, risks, security and privacy concerns of performing an evaluation and management service by telephone and the availability of in person appointments. I also discussed with the patient that there may be a patient responsible charge related to this service. The patient expressed understanding and verbally consented to this telephonic visit.    Interactive audio and video telecommunications were attempted between this provider and patient, however failed, due to patient having technical difficulties OR patient did not have access to video capability.  We continued and completed visit with audio only.   Review of Systems    N/A  Cardiac Risk Factors include: dyslipidemia;smoking/ tobacco exposure     Objective:    Today's Vitals   08/11/19 1430 08/11/19 1431  PainSc: 0-No pain 0-No pain   There is no height or weight on file to calculate BMI.  Advanced Directives 08/11/2019 02/13/2018 05/30/2017 05/17/2017 01/13/2017 11/22/2016 07/25/2016  Does Patient Have a Medical Advance Directive? Yes No No No No No No  Type of Paramedic of [place];Living will - - - - - -  Copy of [place] in Chart? No - copy requested - - - - - -  Would patient like information on creating a medical advance directive? - - No - Patient declined No - Patient declined - - -    Current Medications (verified) Outpatient Encounter Medications as of 08/11/2019  Medication Sig  . albuterol (PROVENTIL) (2.5 MG/3ML) 0.083% nebulizer solution Take 3 mLs (2.5 mg total) by nebulization every 4 (four) hours as  needed for wheezing or shortness of breath. Reported on 03/19/2015  . albuterol (VENTOLIN HFA) 108 (90 Base) MCG/ACT inhaler Inhale 2 puffs into the lungs every 6 (six) hours as needed for wheezing or shortness of breath.  . ALPRAZolam (XANAX) 1 MG tablet TAKE 1 TABLET BY MOUTH EVERY 8 HOURS AS NEEDED  . ARIPiprazole (ABILIFY) 5 MG tablet Take 5 mg by mouth daily as needed (anxiety and depression).   . Budeson-Glycopyrrol-Formoterol (BREZTRI AEROSPHERE) 160-9-4.8 MCG/ACT AERO Inhale 2 puffs into the lungs in the morning and at bedtime.  . clonazePAM (KLONOPIN) 1 MG tablet TAKE 1 TABLET (1 MG TOTAL) BY MOUTH 2 (TWO) TIMES DAILY AS NEEDED.  Marland Kitchen dexlansoprazole (DEXILANT) 60 MG capsule Take 1 capsule (60 mg total) by mouth daily.  . hydroxychloroquine (PLAQUENIL) 200 MG tablet TAKE 2 TABLETS (400 MG TOTAL) BY MOUTH DAILY. FOR DISCOID LUPUS (L93.0)  . nitroGLYCERIN (NITROSTAT) 0.4 MG SL tablet Place 1 tablet (0.4 mg total) under the tongue every 5 (five) minutes as needed for chest pain.  Marland Kitchen propranolol (INDERAL) 20 MG tablet Take 1 tablet (20 mg total) by mouth 2 (two) times daily. (BETA BLOCKER) (Patient taking differently: Take 20 mg by mouth daily. Taking 1 tablet daily (BETA BLOCKER))  . Respiratory Therapy Supplies (FLUTTER) DEVI 1 Device by Does not apply route daily.  Marland Kitchen azelastine (OPTIVAR) 0.05 % ophthalmic solution Place 1 drop into the left eye 2 (two) times daily. (Patient not taking: Reported on 08/11/2019)  . Budeson-Glycopyrrol-Formoterol (BREZTRI AEROSPHERE) 160-9-4.8 MCG/ACT AERO Inhale 2 puffs into the lungs in the morning and at  bedtime. (Patient not taking: Reported on 08/11/2019)   No facility-administered encounter medications on file as of 08/11/2019.    Allergies (verified) Alum & mag hydroxide-simeth, Aspirin, Cefdinir, Chantix [varenicline], Doxycycline, Multaq [dronedarone], and Ivp dye [iodinated diagnostic agents]   History: Past Medical History:  Diagnosis Date  . Anxiety     panic attacks, chest pain  . Arthritis   . COPD (chronic obstructive pulmonary disease) ([place])   . GERD (gastroesophageal reflux disease)   . Hypertension    not medicated  . Non-obstructive CAD in native artery    a. cardiac cath 01/2014: showed minor irregularities, mildly elevated left ventricular end-diastolic pressure and normal ejection fraction; b. 03/2015 MV: low risk.  . Palpitations    a. 08/2016 Event Monitor: occas PVC's, two brief runs of SVT (4 and 7 beats).  Marland Kitchen PONV (postoperative nausea and vomiting)   . Symptomatic PVC's (premature ventricular contractions)   . Tobacco abuse   . Wears dentures    partial upper and lower   Past Surgical History:  Procedure Laterality Date  . CARDIAC CATHETERIZATION  01/03/14  . CARDIAC CATHETERIZATION    . ESOPHAGOGASTRODUODENOSCOPY (EGD) WITH PROPOFOL N/A 07/11/2016   Procedure: ESOPHAGOGASTRODUODENOSCOPY (EGD) WITH PROPOFOL;  Surgeon: Lucilla Lame, MD;  Location: [place];  Service: Endoscopy;  Laterality: N/A;  . EYE SURGERY    . IMAGE GUIDED SINUS SURGERY N/A 11/30/2014   Procedure: IMAGE GUIDED SINUS SURGERY;  Surgeon: Carloyn Manner, MD;  Location: [place];  Service: ENT;  Laterality: N/A;  GAVE DISK TO CE CE  . MAXILLARY ANTROSTOMY Bilateral 11/30/2014   Procedure: MAXILLARY ANTROSTOMY;  Surgeon: Carloyn Manner, MD;  Location: [facility];  Service: ENT;  Laterality: Bilateral;  . SEPTOPLASTY N/A 11/30/2014   Procedure: SEPTOPLASTY;  Surgeon: Carloyn Manner, MD;  Location: [place];  Service: ENT;  Laterality: N/A;  . SPHENOIDECTOMY Left 11/30/2014   Procedure: Coralee Pesa, ;  Surgeon: Carloyn Manner, MD;  Location: [place];  Service: ENT;  Laterality: Left;  Marland Kitchen VAGINAL HYSTERECTOMY  1995   vaginal   Family History  Problem Relation Age of Onset  . Emphysema Father   . Asthma Father   . Heart disease Father   . Heart attack Father   . Arthritis Father        RA  .  Colon cancer Father   . Hypertension Mother   . Breast cancer Neg Hx    Social History   Socioeconomic History  . Marital status: Married    Spouse name: Not on file  . Number of children: 3  . Years of education: Not on file  . Highest education level: GED or equivalent  Occupational History  . Occupation: Disabled    Comment: Social Security as of 05/21/2011  Tobacco Use  . Smoking status: Current Every Day Smoker    Packs/day: 0.50    Years: 16.00    Pack years: 8.00    Types: Cigarettes  . Smokeless tobacco: Never Used  . Tobacco comment: 0.5 pack daily-05/13/2019  Substance and Sexual Activity  . Alcohol use: No    Alcohol/week: 0.0 standard drinks  . Drug use: No  . Sexual activity: Not on file  Other Topics Concern  . Not on file  Social History Narrative   Lives at home with husband. Independent at baseline.   Social Determinants of Health   Financial Resource Strain: Low Risk   . Difficulty of Paying Living Expenses: Not hard at all  Food Insecurity: No Food Insecurity  . Worried About Charity fundraiser in the Last Year: Never true  . Ran Out of Food in the Last Year: Never true  Transportation Needs: No Transportation Needs  . Lack of Transportation (Medical): No  . Lack of Transportation (Non-Medical): No  Physical Activity: Inactive  . Days of Exercise per Week: 0 days  . Minutes of Exercise per Session: 0 min  Stress: Stress Concern Present  . Feeling of Stress : Rather much  Social Connections: Somewhat Isolated  . Frequency of Communication with Friends and Family: More than three times a week  . Frequency of Social Gatherings with Friends and Family: Once a week  . Attends Religious Services: Never  . Active Member of Clubs or Organizations: No  . Attends Archivist Meetings: Never  . Marital Status: Married    Tobacco Counseling Ready to quit: No Counseling given: No Comment: 0.5 pack daily-05/13/2019   Clinical  Intake:  Pre-visit preparation completed: Yes  Pain : No/denies pain Pain Score: 0-No pain     Nutritional Risks: None Diabetes: No  How often do you need to have someone help you when you read instructions, pamphlets, or other written materials from your doctor or pharmacy?: 1 - Never  Interpreter Needed?: No  Information entered by :: [facility], LPN   Activities of Daily Living In your present state of health, do you have any difficulty performing the following activities: 08/11/2019  Hearing? N  Vision? N  Difficulty concentrating or making decisions? N  Walking or climbing stairs? Y  Comment Due to right ankle weakness.  Dressing or bathing? N  Doing errands, shopping? Y  Comment Does not drive.  Preparing Food and eating ? N  Using the Toilet? N  In the past six months, have you accidently leaked urine? N  Do you have problems with loss of bowel control? N  Managing your Medications? N  Managing your Finances? N  Housekeeping or managing your Housekeeping? N  Some recent data might be hidden     Immunizations and Health Maintenance Immunization History  Administered Date(s) Administered  . Pneumococcal Conjugate-13 11/20/2012  . Pneumococcal Polysaccharide-23 08/10/2012   Health Maintenance Due  Topic Date Due  . COLONOSCOPY  08/25/2018  . MAMMOGRAM  11/26/2018    Patient Care Team: Birdie Sons, MD as PCP - General (Family Medicine) Lucilla Lame, MD as Consulting Physician (Gastroenterology) Tyler Pita, MD as Consulting Physician (Pulmonary Disease) Wellington Hampshire, MD as Consulting Physician (Cardiology)  Indicate any recent Medical Services you may have received from other than [facility] providers in the past year (date may be approximate).     Assessment:   This is a routine wellness examination for Andrea [name].  Hearing/Vision screen No exam data present  Dietary issues and exercise activities discussed: Current Exercise Habits: The  patient does not participate in regular exercise at present, Exercise limited by: respiratory conditions(s)  Goals    . Prevent falls     Recommend to remove any items from the home that may cause slips or trips.      Depression Screen PHQ 2/9 Scores 08/11/2019 06/11/2016  PHQ - 2 Score 1 3  PHQ- 9 Score - 16    Fall Risk Fall Risk  08/11/2019 06/11/2016  Falls in the past year? 1 Yes  Number falls in past yr: 0 1  Injury with Fall? 0 No  Follow up Falls prevention discussed -   FALL RISK  PREVENTION PERTAINING TO THE HOME:  Any stairs in or around the home? Yes  If so, are there any without handrails? No   Home free of loose throw rugs in walkways, pet beds, electrical cords, etc? Yes  Adequate lighting in your home to reduce risk of falls? Yes   ASSISTIVE DEVICES UTILIZED TO PREVENT FALLS:  Life alert? No  Use of a cane, walker or w/c? No  Grab bars in the bathroom? Yes  Shower chair or bench in shower? Yes  Elevated toilet seat or a handicapped toilet? No    TIMED UP AND GO:  Was the test performed? No .     Cognitive Function: Declined today.        Screening Tests Health Maintenance  Topic Date Due  . COLONOSCOPY  08/25/2018  . MAMMOGRAM  11/26/2018  . COVID-19 Vaccine (1) 08/27/2019 (Originally 10/23/1977)  . TETANUS/TDAP  08/10/2020 (Originally 10/23/1982)  . INFLUENZA VACCINE  10/03/2019  . Hepatitis C Screening  Completed  . HIV Screening  Completed     Tdap: Although this vaccine is not a covered service during a Wellness Exam, does the patient still wish to receive this vaccine today?  No . Advised may receive this vaccine at local pharmacy or Health Dept. Aware to provide a copy of the vaccination record if obtained from local pharmacy or Health Dept. Verbalized acceptance and understanding.  Flu Vaccine: Due fall 2021   Cancer Screenings:  Colorectal Screening: Completed 08/25/13. Repeat every 5 years. Referral to GI placed today. Pt aware the  office will call re: appt.  Mammogram: Completed 11/25/16. Repeat every year. Ordered today. Pt provided with contact info and advised to call to schedule appt.   Lung Cancer Screening: (Low Dose CT Chest recommended if Age 20-80 years, 30 pack-year currently smoking OR have quit w/in 15years.) does qualify however had this completed 12/15/18. Repeat yearly.  Additional Screening:  Hepatitis C Screening: Up to date  Vision Screening: Recommended annual ophthalmology exams for early detection of glaucoma and other disorders of the eye.  Dental Screening: Recommended annual dental exams for proper oral hygiene  Community Resource Referral:  CRR required this visit? No     Plan:  I have personally reviewed and addressed the Medicare Annual Wellness questionnaire and have noted the following in the patient's chart:  A. Medical and social history B. Use of alcohol, tobacco or illicit drugs  C. Current medications and supplements D. Functional ability and status E.  Nutritional status F.  Physical activity G. Advance directives H. List of other physicians I.  Hospitalizations, surgeries, and ER visits in previous 12 months J.  [place] such as hearing and vision if needed, cognitive and depression L. Referrals and appointments   In addition, I have reviewed and discussed with patient certain preventive protocols, quality metrics, and best practice recommendations. A written personalized care plan for preventive services as well as general preventive health recommendations were provided to patient.   [place]endora Score, [place]   08/09/1273  Nurse Health Advisor   Nurse Notes: Colonoscopy referral sent. Mammogram ordered today. Pt to contact [place] to schedule apt.

## 2019-08-11 ENCOUNTER — Other Ambulatory Visit: Payer: Self-pay

## 2019-08-11 ENCOUNTER — Ambulatory Visit (INDEPENDENT_AMBULATORY_CARE_PROVIDER_SITE_OTHER): Payer: Medicare Other

## 2019-08-11 DIAGNOSIS — Z1211 Encounter for screening for malignant neoplasm of colon: Secondary | ICD-10-CM | POA: Diagnosis not present

## 2019-08-11 DIAGNOSIS — Z86010 Personal history of colonic polyps: Secondary | ICD-10-CM | POA: Diagnosis not present

## 2019-08-11 DIAGNOSIS — Z0000 Encounter for general adult medical examination without abnormal findings: Secondary | ICD-10-CM

## 2019-08-11 DIAGNOSIS — Z1231 Encounter for screening mammogram for malignant neoplasm of breast: Secondary | ICD-10-CM

## 2019-08-11 NOTE — Patient Instructions (Signed)
[name] , Thank you for taking time to come for your Medicare Wellness Visit. I appreciate your ongoing commitment to your health goals. Please review the following plan we discussed and let me know if I can assist you in the future.   Screening recommendations/referrals: Colonoscopy: Referral to GI placed today. Pt aware the office will call re: appt. Mammogram: Ordered today. Pt provided with contact info and advised to call to schedule appt.  Recommended yearly ophthalmology/optometry visit for glaucoma screening and checkup Recommended yearly dental visit for hygiene and checkup  Vaccinations: Influenza vaccine: Due fall 2021 Tdap vaccine: Pt declines today.     Advanced directives: Please bring a copy of your POA (Power of Attorney) and/or Living Will to your next appointment.   Conditions/risks identified: Fall risk preventatives discussed today.  Next appointment: 11/09/19 @ 3:00 PM with Dr Andrea [name]. Declined scheduling an AWV for 2022 at this time.   Preventive Care 40-64 Years, Female Preventive care refers to lifestyle choices and visits with your health care provider that can promote health and wellness. What does preventive care include?  A yearly physical exam. This is also called an annual well check.  Dental exams once or twice a year.  Routine eye exams. Ask your health care provider how often you should have your eyes checked.  Personal lifestyle choices, including:  Daily care of your teeth and gums.  Regular physical activity.  Eating a healthy diet.  Avoiding tobacco and drug use.  Limiting alcohol use.  Practicing safe sex.  Taking low-dose aspirin daily starting at age 86.  Taking vitamin and mineral supplements as recommended by your health care provider. What happens during an annual well check? The services and screenings done by your health care provider during your annual well check will depend on your age, overall health, lifestyle risk  factors, and family history of disease. Counseling  Your health care provider may ask you questions about your:  Alcohol use.  Tobacco use.  Drug use.  Emotional well-being.  Home and relationship well-being.  Sexual activity.  Eating habits.  Work and work Statistician.  Method of birth control.  Menstrual cycle.  Pregnancy history. Screening  You may have the following tests or measurements:  Height, weight, and BMI.  Blood pressure.  Lipid and cholesterol levels. These may be checked every 5 years, or more frequently if you are over 71 years old.  Skin check.  Lung cancer screening. You may have this screening every year starting at age 38 if you have a 30-pack-year history of smoking and currently smoke or have quit within the past 15 years.  Fecal occult blood test (FOBT) of the stool. You may have this test every year starting at age 80.  Flexible sigmoidoscopy or colonoscopy. You may have a sigmoidoscopy every 5 years or a colonoscopy every 10 years starting at age 8.  Hepatitis C blood test.  Hepatitis B blood test.  Sexually transmitted disease (STD) testing.  Diabetes screening. This is done by checking your blood sugar (glucose) after you have not eaten for a while (fasting). You may have this done every 1-3 years.  Mammogram. This may be done every 1-2 years. Talk to your health care provider about when you should start having regular mammograms. This may depend on whether you have a family history of breast cancer.  BRCA-related cancer screening. This may be done if you have a family history of breast, ovarian, tubal, or peritoneal cancers.  Pelvic exam and Pap  test. This may be done every 3 years starting at age 70. Starting at age 24, this may be done every 5 years if you have a Pap test in combination with an HPV test.  Bone density scan. This is done to screen for osteoporosis. You may have this scan if you are at high risk for  osteoporosis. Discuss your test results, treatment options, and if necessary, the need for more tests with your health care provider. Vaccines  Your health care provider may recommend certain vaccines, such as:  Influenza vaccine. This is recommended every year.  Tetanus, diphtheria, and acellular pertussis (Tdap, Td) vaccine. You may need a Td booster every 10 years.  Zoster vaccine. You may need this after age 46.  Pneumococcal 13-valent conjugate (PCV13) vaccine. You may need this if you have certain conditions and were not previously vaccinated.  Pneumococcal polysaccharide (PPSV23) vaccine. You may need one or two doses if you smoke cigarettes or if you have certain conditions. Talk to your health care provider about which screenings and vaccines you need and how often you need them. This information is not intended to replace advice given to you by your health care provider. Make sure you discuss any questions you have with your health care provider. Document Released: 03/17/2015 Document Revised: 11/08/2015 Document Reviewed: 12/20/2014 Elsevier Interactive Patient Education  2017 [place] Prevention in the Home Falls can cause injuries. They can happen to people of all ages. There are many things you can do to make your home safe and to help prevent falls. What can I do on the outside of my home?  Regularly fix the edges of walkways and driveways and fix any cracks.  Remove anything that might make you trip as you walk through a door, such as a raised step or threshold.  Trim any bushes or trees on the path to your home.  Use bright outdoor lighting.  Clear any walking paths of anything that might make someone trip, such as rocks or tools.  Regularly check to see if handrails are loose or broken. Make sure that both sides of any steps have handrails.  Any raised decks and porches should have guardrails on the edges.  Have any leaves, snow, or ice cleared  regularly.  Use sand or salt on walking paths during winter.  Clean up any spills in your garage right away. This includes oil or grease spills. What can I do in the bathroom?  Use night lights.  Install grab bars by the toilet and in the tub and shower. Do not use towel bars as grab bars.  Use non-skid mats or decals in the tub or shower.  If you need to sit down in the shower, use a plastic, non-slip stool.  Keep the floor dry. Clean up any water that spills on the floor as soon as it happens.  Remove soap buildup in the tub or shower regularly.  Attach bath mats securely with double-sided non-slip rug tape.  Do not have throw rugs and other things on the floor that can make you trip. What can I do in the bedroom?  Use night lights.  Make sure that you have a light by your bed that is easy to reach.  Do not use any sheets or blankets that are too big for your bed. They should not hang down onto the floor.  Have a firm chair that has side arms. You can use this for support while you get  dressed.  Do not have throw rugs and other things on the floor that can make you trip. What can I do in the kitchen?  Clean up any spills right away.  Avoid walking on wet floors.  Keep items that you use a lot in easy-to-reach places.  If you need to reach something above you, use a strong step stool that has a grab bar.  Keep electrical cords out of the way.  Do not use floor polish or wax that makes floors slippery. If you must use wax, use non-skid floor wax.  Do not have throw rugs and other things on the floor that can make you trip. What can I do with my stairs?  Do not leave any items on the stairs.  Make sure that there are handrails on both sides of the stairs and use them. Fix handrails that are broken or loose. Make sure that handrails are as long as the stairways.  Check any carpeting to make sure that it is firmly attached to the stairs. Fix any carpet that is loose  or worn.  Avoid having throw rugs at the top or bottom of the stairs. If you do have throw rugs, attach them to the floor with carpet tape.  Make sure that you have a light switch at the top of the stairs and the bottom of the stairs. If you do not have them, ask someone to add them for you. What else can I do to help prevent falls?  Wear shoes that:  Do not have high heels.  Have rubber bottoms.  Are comfortable and fit you well.  Are closed at the toe. Do not wear sandals.  If you use a stepladder:  Make sure that it is fully opened. Do not climb a closed stepladder.  Make sure that both sides of the stepladder are locked into place.  Ask someone to hold it for you, if possible.  Clearly mark and make sure that you can see:  Any grab bars or handrails.  First and last steps.  Where the edge of each step is.  Use tools that help you move around (mobility aids) if they are needed. These include:  Canes.  Walkers.  Scooters.  Crutches.  Turn on the lights when you go into a dark area. Replace any light bulbs as soon as they burn out.  Set up your furniture so you have a clear path. Avoid moving your furniture around.  If any of your floors are uneven, fix them.  If there are any pets around you, be aware of where they are.  Review your medicines with your doctor. Some medicines can make you feel dizzy. This can increase your chance of falling. Ask your doctor what other things that you can do to help prevent falls. This information is not intended to replace advice given to you by your health care provider. Make sure you discuss any questions you have with your health care provider. Document Released: 12/15/2008 Document Revised: 07/27/2015 Document Reviewed: 03/25/2014 Elsevier Interactive Patient Education  2017 [employer].

## 2019-08-16 ENCOUNTER — Telehealth (INDEPENDENT_AMBULATORY_CARE_PROVIDER_SITE_OTHER): Payer: Self-pay | Admitting: Gastroenterology

## 2019-08-16 ENCOUNTER — Other Ambulatory Visit: Payer: Self-pay

## 2019-08-16 DIAGNOSIS — Z86010 Personal history of colonic polyps: Secondary | ICD-10-CM

## 2019-08-16 NOTE — Progress Notes (Signed)
Gastroenterology Pre-Procedure Review  Request Date: Tuesday 09/14/19 Requesting Physician: Dr. Allen Norris  PATIENT REVIEW QUESTIONS: The patient responded to the following health history questions as indicated:    1. Are you having any GI issues? yes (bloated) 2. Do you have a personal history of Polyps?  Yes about 4 years ago 3. Do you have a family history of Colon Cancer or Polyps? yes (father colon cancer) 4. Diabetes Mellitus? no 5. Joint replacements in the past 12 months?no 6. Major health problems in the past 3 months?no 7. Any artificial heart valves, MVP, or defibrillator?no    MEDICATIONS & ALLERGIES:    Patient reports the following regarding taking any anticoagulation/antiplatelet therapy:   Plavix, Coumadin, Eliquis, Xarelto, Lovenox, Pradaxa, Brilinta, or Effient? no Aspirin? no  Patient confirms/reports the following medications:  Current Outpatient Medications  Medication Sig Dispense Refill  . albuterol (PROVENTIL) (2.5 MG/3ML) 0.083% nebulizer solution Take 3 mLs (2.5 mg total) by nebulization every 4 (four) hours as needed for wheezing or shortness of breath. Reported on 03/19/2015 75 mL 12  . albuterol (VENTOLIN HFA) 108 (90 Base) MCG/ACT inhaler Inhale 2 puffs into the lungs every 6 (six) hours as needed for wheezing or shortness of breath. 18 g 6  . ALPRAZolam (XANAX) 1 MG tablet TAKE 1 TABLET BY MOUTH EVERY 8 HOURS AS NEEDED 60 tablet 5  . ARIPiprazole (ABILIFY) 5 MG tablet Take 5 mg by mouth daily as needed (anxiety and depression).     Marland Kitchen azelastine (OPTIVAR) 0.05 % ophthalmic solution Place 1 drop into the left eye 2 (two) times daily. 6 mL 12  . Budeson-Glycopyrrol-Formoterol (BREZTRI AEROSPHERE) 160-9-4.8 MCG/ACT AERO Inhale 2 puffs into the lungs in the morning and at bedtime. 10.7 g 11  . Budeson-Glycopyrrol-Formoterol (BREZTRI AEROSPHERE) 160-9-4.8 MCG/ACT AERO Inhale 2 puffs into the lungs in the morning and at bedtime. 10.7 g 0  . clonazePAM (KLONOPIN) 1 MG  tablet TAKE 1 TABLET (1 MG TOTAL) BY MOUTH 2 (TWO) TIMES DAILY AS NEEDED. 60 tablet 3  . dexlansoprazole (DEXILANT) 60 MG capsule Take 1 capsule (60 mg total) by mouth daily. 90 capsule 3  . hydroxychloroquine (PLAQUENIL) 200 MG tablet TAKE 2 TABLETS (400 MG TOTAL) BY MOUTH DAILY. FOR DISCOID LUPUS (L93.0) 180 tablet 0  . nitroGLYCERIN (NITROSTAT) 0.4 MG SL tablet Place 1 tablet (0.4 mg total) under the tongue every 5 (five) minutes as needed for chest pain. 25 tablet 0  . propranolol (INDERAL) 20 MG tablet Take 1 tablet (20 mg total) by mouth 2 (two) times daily. (BETA BLOCKER) (Patient taking differently: Take 20 mg by mouth daily. Taking 1 tablet daily (BETA BLOCKER)) 60 tablet 4  . Respiratory Therapy Supplies (FLUTTER) DEVI 1 Device by Does not apply route daily. 1 each 0   No current facility-administered medications for this visit.    Patient confirms/reports the following allergies:  Allergies  Allergen Reactions  . Alum & Mag Hydroxide-Simeth Diarrhea and Nausea Only  . Aspirin     Nosebleed   . Cefdinir Other (See Comments)    tachycardia  . Chantix [Varenicline]     Bad dreams  . Doxycycline Other (See Comments)    Nausea, migraine  . Multaq [Dronedarone] Other (See Comments)    Lip/ mouth numbness/ tongue swelling  . Ivp Dye [Iodinated Diagnostic Agents] Palpitations    No orders of the defined types were placed in this encounter.   AUTHORIZATION INFORMATION Primary Insurance: 1D#: Group #:  Secondary Insurance: 1D#: Group #:  SCHEDULE  INFORMATION: Date: Tuesday 09/14/19 Time: Location:[facility]

## 2019-08-18 ENCOUNTER — Other Ambulatory Visit: Payer: Self-pay | Admitting: Gastroenterology

## 2019-08-18 ENCOUNTER — Other Ambulatory Visit: Payer: Self-pay

## 2019-08-18 ENCOUNTER — Telehealth: Payer: Self-pay | Admitting: Gastroenterology

## 2019-08-18 MED ORDER — DEXILANT 60 MG PO CPDR: 60.0000 mg | DELAYED_RELEASE_CAPSULE | Freq: Every day | ORAL | 1 refills | Status: DC

## 2019-08-18 NOTE — Telephone Encounter (Signed)
Pt's prescription has been sent to [facility] per pt request.

## 2019-08-18 NOTE — Telephone Encounter (Signed)
Patient called & l/m stating she needs her dexlansoprazole (DEXILANT) 60 MG capsule refilled at the [facility]. She did try and contact them first.

## 2019-08-21 ENCOUNTER — Encounter: Payer: Self-pay | Admitting: Family Medicine

## 2019-08-26 ENCOUNTER — Telehealth: Payer: Self-pay | Admitting: Gastroenterology

## 2019-08-26 NOTE — Telephone Encounter (Signed)
Patient is scheduled for a colonoscopy on 09-14-2019 & would like to have an upper Endoscopy at the same time. She is a patient of Dr Dorothey Baseman & states she had one in the past.

## 2019-08-30 NOTE — Telephone Encounter (Signed)
Contacted pt regarding adding an EGD to her procedure that is scheduled. Pt requested to reschedule procedure to 10/05/19.

## 2019-09-08 ENCOUNTER — Telehealth: Payer: Self-pay

## 2019-09-08 NOTE — Telephone Encounter (Signed)
Returned patients call in regards to the date for COVID Testing.  LVM to make patient aware her COVID test should be done on Friday 10/01/19 at [place] located on [facility].  Arrive between 8am-1pm.  Thanks,  [place], CMA

## 2019-09-13 ENCOUNTER — Ambulatory Visit: Payer: Medicare Other | Admitting: Family Medicine

## 2019-09-14 ENCOUNTER — Ambulatory Visit: Payer: Medicare Other | Admitting: Family Medicine

## 2019-09-23 ENCOUNTER — Ambulatory Visit: Payer: Medicare Other | Admitting: Pulmonary Disease

## 2019-09-29 ENCOUNTER — Other Ambulatory Visit: Payer: Self-pay | Admitting: Family Medicine

## 2019-09-29 ENCOUNTER — Telehealth: Payer: Self-pay | Admitting: Pulmonary Disease

## 2019-09-29 MED ORDER — AZITHROMYCIN 250 MG PO TABS: ORAL_TABLET | ORAL | 0 refills | Status: AC

## 2019-09-29 MED ORDER — PREDNISONE 10 MG (21) PO TBPK: ORAL_TABLET | ORAL | 0 refills | Status: DC

## 2019-09-29 NOTE — Telephone Encounter (Signed)
Requested medication (s) are due for refill today  Due 10/16/19  Requested medication (s) are on the active medication list Yes  Future visit scheduled Yes  11/09/19,  LOV 06/04/19  Note to clinic-This medication is not delegated for the [facility] to refill.        Requested Prescriptions  Pending Prescriptions Disp Refills   hydroxychloroquine (PLAQUENIL) 200 MG tablet [Pharmacy Med Name: HYDROXYCHLOROQUINE 200 MG TAB] 180 tablet 0    Sig: TAKE 2 TABLETS (400 MG TOTAL) BY MOUTH DAILY. FOR DISCOID LUPUS (L93.0)      Not Delegated - Analgesics:  Antirheumatic Agents - abatacept & hydroxychloroquine Failed - 09/29/2019 11:52 AM      Failed - This refill cannot be delegated      Passed - Valid encounter within last 6 months    Recent Outpatient Visits           3 months ago [place], Donald E, MD   1 year ago Acute recurrent pansinusitis   [place], [place]   1 year ago Chest pain, unspecified type   [facility] Birdie Sons, MD   2 years ago Acute maxillary sinusitis, recurrence not specified   [facility] Birdie Sons, MD   2 years ago Cough   [facility] Birdie Sons, MD       Future Appointments             In 2 weeks Elie Confer, Lars Masson, NP [place]

## 2019-09-29 NOTE — Telephone Encounter (Signed)
Pt is aware of below recommendations and voiced her understanding.  Rx for prednisone and zpak has been sent to preferred pharmacy. Nothing further is needed at this time.

## 2019-09-29 NOTE — Telephone Encounter (Signed)
Called and spoke to pt.  Pt feels that she has developed a COPD flare.  Pt reports of prod cough with clear mucus and wheezing at mainly at night with laying flat x3d.  Sob is baseline.  Pt doing albuterol neb QID and albuterol HFA BID with some relief in sx.  Denied fever, chills or swears.  Pt is scheduled for colonoscopy on 10/05/2019. She is concerned that this will be postponed due to flare up.  Dr. Patsey Berthold, please advise. Thanks.

## 2019-09-29 NOTE — Telephone Encounter (Signed)
Recommend Pred taper  pak (Pred 10 mg #21) as directed in package + Azithromycin Z pak

## 2019-10-01 ENCOUNTER — Other Ambulatory Visit: Payer: Self-pay

## 2019-10-01 ENCOUNTER — Other Ambulatory Visit
Admission: RE | Admit: 2019-10-01 | Discharge: 2019-10-01 | Disposition: A | Discharge status: Home / Self Care | Payer: Medicare Other | Source: Ambulatory Visit | Attending: Gastroenterology | Admitting: Gastroenterology

## 2019-10-01 DIAGNOSIS — Z01812 Encounter for preprocedural laboratory examination: Secondary | ICD-10-CM | POA: Diagnosis not present

## 2019-10-01 DIAGNOSIS — Z20822 Contact with and (suspected) exposure to covid-19: Secondary | ICD-10-CM | POA: Diagnosis not present

## 2019-10-01 LAB — SARS CORONAVIRUS 2 (TAT 6-24 HRS): SARS Coronavirus 2: NEGATIVE

## 2019-10-04 ENCOUNTER — Encounter: Payer: Self-pay | Admitting: Gastroenterology

## 2019-10-05 ENCOUNTER — Encounter
Admission: RE | Disposition: A | Discharge status: Home / Self Care | Payer: Self-pay | Source: Home / Self Care | Attending: Gastroenterology

## 2019-10-05 ENCOUNTER — Ambulatory Visit: Payer: Medicare Other | Admitting: Certified Registered"

## 2019-10-05 ENCOUNTER — Ambulatory Visit
Admission: RE | Admit: 2019-10-05 | Discharge: 2019-10-05 | Disposition: A | Discharge status: Home / Self Care | Payer: Medicare Other | Attending: Gastroenterology | Admitting: Gastroenterology

## 2019-10-05 ENCOUNTER — Other Ambulatory Visit: Payer: Self-pay

## 2019-10-05 DIAGNOSIS — K640 First degree hemorrhoids: Secondary | ICD-10-CM | POA: Insufficient documentation

## 2019-10-05 DIAGNOSIS — Z91041 Radiographic dye allergy status: Secondary | ICD-10-CM | POA: Insufficient documentation

## 2019-10-05 DIAGNOSIS — F17210 Nicotine dependence, cigarettes, uncomplicated: Secondary | ICD-10-CM | POA: Diagnosis not present

## 2019-10-05 DIAGNOSIS — F419 Anxiety disorder, unspecified: Secondary | ICD-10-CM | POA: Diagnosis not present

## 2019-10-05 DIAGNOSIS — Z881 Allergy status to other antibiotic agents status: Secondary | ICD-10-CM | POA: Diagnosis not present

## 2019-10-05 DIAGNOSIS — K449 Diaphragmatic hernia without obstruction or gangrene: Secondary | ICD-10-CM | POA: Diagnosis not present

## 2019-10-05 DIAGNOSIS — I10 Essential (primary) hypertension: Secondary | ICD-10-CM | POA: Diagnosis not present

## 2019-10-05 DIAGNOSIS — Z860100 Personal history of colon polyps, unspecified: Secondary | ICD-10-CM

## 2019-10-05 DIAGNOSIS — K635 Polyp of colon: Secondary | ICD-10-CM

## 2019-10-05 DIAGNOSIS — K219 Gastro-esophageal reflux disease without esophagitis: Secondary | ICD-10-CM | POA: Diagnosis not present

## 2019-10-05 DIAGNOSIS — F418 Other specified anxiety disorders: Secondary | ICD-10-CM | POA: Diagnosis not present

## 2019-10-05 DIAGNOSIS — J449 Chronic obstructive pulmonary disease, unspecified: Secondary | ICD-10-CM | POA: Diagnosis not present

## 2019-10-05 DIAGNOSIS — I2510 Atherosclerotic heart disease of native coronary artery without angina pectoris: Secondary | ICD-10-CM | POA: Insufficient documentation

## 2019-10-05 DIAGNOSIS — Z886 Allergy status to analgesic agent status: Secondary | ICD-10-CM | POA: Insufficient documentation

## 2019-10-05 DIAGNOSIS — J441 Chronic obstructive pulmonary disease with (acute) exacerbation: Secondary | ICD-10-CM | POA: Diagnosis not present

## 2019-10-05 DIAGNOSIS — Z860101 Personal history of adenomatous and serrated colon polyps: Secondary | ICD-10-CM

## 2019-10-05 DIAGNOSIS — Z1211 Encounter for screening for malignant neoplasm of colon: Secondary | ICD-10-CM | POA: Diagnosis not present

## 2019-10-05 DIAGNOSIS — L930 Discoid lupus erythematosus: Secondary | ICD-10-CM | POA: Diagnosis not present

## 2019-10-05 DIAGNOSIS — F329 Major depressive disorder, single episode, unspecified: Secondary | ICD-10-CM | POA: Diagnosis not present

## 2019-10-05 DIAGNOSIS — D123 Benign neoplasm of transverse colon: Secondary | ICD-10-CM | POA: Insufficient documentation

## 2019-10-05 DIAGNOSIS — Z79899 Other long term (current) drug therapy: Secondary | ICD-10-CM | POA: Diagnosis not present

## 2019-10-05 HISTORY — PX: ESOPHAGOGASTRODUODENOSCOPY (EGD) WITH PROPOFOL: SHX5813

## 2019-10-05 HISTORY — PX: COLONOSCOPY WITH PROPOFOL: SHX5780

## 2019-10-05 SURGERY — COLONOSCOPY WITH PROPOFOL
Anesthesia: General

## 2019-10-05 MED ORDER — MIDAZOLAM HCL 2 MG/2ML IJ SOLN
INTRAMUSCULAR | Status: DC | PRN
  Administered 2019-10-05 (×2): 1 mg via INTRAVENOUS

## 2019-10-05 MED ORDER — PHENYLEPHRINE HCL (PRESSORS) 10 MG/ML IV SOLN
INTRAVENOUS | Status: DC | PRN
  Administered 2019-10-05: 100 ug via INTRAVENOUS

## 2019-10-05 MED ORDER — LIDOCAINE HCL (CARDIAC) PF 100 MG/5ML IV SOSY
PREFILLED_SYRINGE | INTRAVENOUS | Status: DC | PRN
  Administered 2019-10-05: 100 mg via INTRAVENOUS

## 2019-10-05 MED ORDER — SODIUM CHLORIDE 0.9 % IV SOLN
INTRAVENOUS | Status: DC
  Administered 2019-10-05: 20 mL/h via INTRAVENOUS

## 2019-10-05 MED ORDER — MIDAZOLAM HCL 2 MG/2ML IJ SOLN
INTRAMUSCULAR | Status: AC
  Filled 2019-10-05: qty 2

## 2019-10-05 MED ORDER — PROPOFOL 500 MG/50ML IV EMUL
INTRAVENOUS | Status: DC | PRN
  Administered 2019-10-05: 165 ug/kg/min via INTRAVENOUS
  Administered 2019-10-05: 145 ug/kg/min via INTRAVENOUS

## 2019-10-05 MED ORDER — GLYCOPYRROLATE 0.2 MG/ML IJ SOLN
INTRAMUSCULAR | Status: DC | PRN
  Administered 2019-10-05: .2 mg via INTRAVENOUS

## 2019-10-05 MED ORDER — PROPOFOL 10 MG/ML IV BOLUS
INTRAVENOUS | Status: DC | PRN
Start: 2019-10-05 — End: 2019-10-05
  Administered 2019-10-05: 20 mg via INTRAVENOUS
  Administered 2019-10-05: 30 mg via INTRAVENOUS

## 2019-10-05 NOTE — Op Note (Signed)
[facility] Gastroenterology Patient Name: Andrea [name] Procedure Date: 10/05/2019 8:54 AM MRN: [id number] Account #: [id number] Date of Birth: March 15, 1965 Admit Type: Outpatient Age: 54 Room: [facility] ENDO ROOM 4 Gender: Female Note Status: Finalized Procedure:             Upper GI endoscopy Indications:           Heartburn Providers:             Lucilla Lame MD, MD Referring MD:          Cletis Athens, MD (Referring MD) Medicines:             Propofol per Anesthesia Complications:         No immediate complications. Procedure:             Pre-Anesthesia Assessment:                        - Prior to the procedure, a History and Physical was                         performed, and patient medications and allergies were                         reviewed. The patient's tolerance of previous                         anesthesia was also reviewed. The risks and benefits                         of the procedure and the sedation options and risks                         were discussed with the patient. All questions were                         answered, and informed consent was obtained. Prior                         Anticoagulants: The patient has taken no previous                         anticoagulant or antiplatelet agents. ASA Grade                         Assessment: II - A patient with mild systemic disease.                         After reviewing the risks and benefits, the patient                         was deemed in satisfactory condition to undergo the                         procedure.                        After obtaining informed consent, the endoscope was  passed under direct vision. Throughout the procedure,                         the patient's blood pressure, pulse, and oxygen                         saturations were monitored continuously. The Endoscope                         was introduced through the mouth, and advanced to the                          second part of duodenum. The upper GI endoscopy was                         accomplished without difficulty. The patient tolerated                         the procedure well. Findings:      A small hiatal hernia was present.      The stomach was normal.      The examined duodenum was normal. Impression:            - Small hiatal hernia.                        - Normal stomach.                        - Normal examined duodenum.                        - No specimens collected. Recommendation:        - Discharge patient to home.                        - Resume previous diet.                        - Perform a colonoscopy today. Procedure Code(s):     --- Professional ---                        [phone], Esophagogastroduodenoscopy, flexible,                         transoral; diagnostic, including collection of                         specimen(s) by brushing or washing, when performed                         (separate procedure) Diagnosis Code(s):     --- Professional ---                        R12, Heartburn CPT copyright 2019 American Medical Association. All rights reserved. The codes documented in this report are preliminary and upon coder review may  be revised to meet current compliance requirements. Lucilla Lame MD, MD 10/05/2019 9:12:55 AM This report has been signed electronically. Number of Addenda: 0 Note Initiated On: 10/05/2019 8:54 AM Estimated Blood Loss:  Estimated  blood loss: none.      [facility]

## 2019-10-05 NOTE — Anesthesia Procedure Notes (Signed)
Procedure Name: General with mask airway Performed by: Fletcher-Harrison, Arrion Broaddus, CRNA Pre-anesthesia Checklist: Patient identified, Emergency Drugs available, Suction available and Patient being monitored Patient Re-evaluated:Patient Re-evaluated prior to induction Oxygen Delivery Method: Simple face mask Induction Type: IV induction Placement Confirmation: positive ETCO2 and CO2 detector Dental Injury: Teeth and Oropharynx as per pre-operative assessment        

## 2019-10-05 NOTE — Anesthesia Preprocedure Evaluation (Signed)
Anesthesia Evaluation  Patient identified by MRN, date of birth, ID band Patient awake    Reviewed: Allergy & Precautions, H&P , NPO status , Patient's Chart, lab work & pertinent test results, reviewed documented beta blocker date and time   History of Anesthesia Complications (+) PONV and history of anesthetic complications  Airway Mallampati: II   Neck ROM: full    Dental  (+) Teeth Intact   Pulmonary shortness of breath, COPD, Current Smoker and Patient abstained from smoking.,    Pulmonary exam normal        Cardiovascular Exercise Tolerance: Poor hypertension, On Medications + CAD  Normal cardiovascular exam Rhythm:regular Rate:Normal     Neuro/Psych PSYCHIATRIC DISORDERS Anxiety Depression  Neuromuscular disease    GI/Hepatic Neg liver ROS, GERD  Medicated,  Endo/Other  negative endocrine ROS  Renal/GU negative Renal ROS  negative genitourinary   Musculoskeletal   Abdominal   Peds  Hematology negative hematology ROS (+)   Anesthesia Other Findings Past Medical History: No date: Anxiety     Comment:  panic attacks, chest pain No date: Arthritis No date: COPD (chronic obstructive pulmonary disease) (HCC) No date: GERD (gastroesophageal reflux disease) No date: Hypertension     Comment:  not medicated No date: Non-obstructive CAD in native artery     Comment:  a. cardiac cath 01/2014: showed minor irregularities,               mildly elevated left ventricular end-diastolic pressure               and normal ejection fraction; b. 03/2015 MV: low risk. No date: Palpitations     Comment:  a. 08/2016 Event Monitor: occas PVC's, two brief runs of               SVT (4 and 7 beats). No date: PONV (postoperative nausea and vomiting) No date: Symptomatic PVC's (premature ventricular contractions) No date: Tobacco abuse No date: Wears dentures     Comment:  partial upper and lower Past Surgical  History: 01/03/14: CARDIAC CATHETERIZATION No date: CARDIAC CATHETERIZATION 07/11/2016: ESOPHAGOGASTRODUODENOSCOPY (EGD) WITH PROPOFOL; N/A     Comment:  Procedure: ESOPHAGOGASTRODUODENOSCOPY (EGD) WITH               PROPOFOL;  Surgeon: Lucilla Lame, MD;  Location: [place];  Service: Endoscopy;  Laterality: N/A; No date: EYE SURGERY 11/30/2014: IMAGE GUIDED SINUS SURGERY; N/A     Comment:  Procedure: IMAGE GUIDED SINUS SURGERY;  Surgeon:               Carloyn Manner, MD;  Location: [place];                Service: ENT;  Laterality: N/A;  GAVE DISK TO CE CE 11/30/2014: MAXILLARY ANTROSTOMY; Bilateral     Comment:  Procedure: MAXILLARY ANTROSTOMY;  Surgeon: Carloyn Manner, MD;  Location: [place];  Service:               ENT;  Laterality: Bilateral; 11/30/2014: SEPTOPLASTY; N/A     Comment:  Procedure: SEPTOPLASTY;  Surgeon: Carloyn Manner, MD;               Location: [place];  Service: ENT;                Laterality: N/A;  11/30/2014: SPHENOIDECTOMY; Left     Comment:  Procedure: SPHENOIDECTOMY, ;  Surgeon: Carloyn Manner,              MD;  Location: [place];  Service: ENT;                Laterality: Left; 1995: VAGINAL HYSTERECTOMY     Comment:  vaginal BMI    Body Mass Index: 28.34 kg/m     Reproductive/Obstetrics negative OB ROS                             Anesthesia Physical Anesthesia Plan  ASA: III  Anesthesia Plan: General   Post-op Pain Management:    Induction:   PONV Risk Score and Plan:   Airway Management Planned:   Additional Equipment:   Intra-op Plan:   Post-operative Plan:   Informed Consent: I have reviewed the patients History and Physical, chart, labs and discussed the procedure including the risks, benefits and alternatives for the proposed anesthesia with the patient or authorized representative who has indicated his/her  understanding and acceptance.     Dental Advisory Given  Plan Discussed with: CRNA  Anesthesia Plan Comments:         Anesthesia Quick Evaluation

## 2019-10-05 NOTE — Transfer of Care (Signed)
Immediate Anesthesia Transfer of Care Note  Patient: Andrea [name]  Procedure(s) Performed: COLONOSCOPY WITH PROPOFOL (N/A ) ESOPHAGOGASTRODUODENOSCOPY (EGD) WITH PROPOFOL (N/A )  Patient Location: Endoscopy Unit  Anesthesia Type:General  Level of Consciousness: drowsy and responds to stimulation  Airway & Oxygen Therapy: Patient Spontanous Breathing and Patient connected to face mask oxygen  Post-op Assessment: Report given to RN and Post -op Vital signs reviewed and stable  Post vital signs: Reviewed and stable  Last Vitals:  Vitals Value Taken Time  BP 97/54 10/05/19 0927  Temp 36.4 C 10/05/19 0927  Pulse 92 10/05/19 0928  Resp 17 10/05/19 0928  SpO2 100 % 10/05/19 0928  Vitals shown include unvalidated device data.  Last Pain:  Vitals:   10/05/19 0816  TempSrc: Temporal  PainSc: 0-No pain         Complications: No complications documented.

## 2019-10-05 NOTE — Op Note (Signed)
[facility] Gastroenterology Patient Name: Andrea [name] Procedure Date: 10/05/2019 8:53 AM MRN: [id number] Account #: [id number] Date of Birth: 1966-02-10 Admit Type: Outpatient Age: 54 Room: [facility] ENDO ROOM 4 Gender: Female Note Status: Finalized Procedure:             Colonoscopy Indications:           Surveillance: Personal history of adenomatous polyps                         on last colonoscopy 5 years ago Providers:             Lucilla Lame MD, MD Referring MD:          Cletis Athens, MD (Referring MD) Medicines:             Propofol per Anesthesia Complications:         No immediate complications. Procedure:             Pre-Anesthesia Assessment:                        - Prior to the procedure, a History and Physical was                         performed, and patient medications and allergies were                         reviewed. The patient's tolerance of previous                         anesthesia was also reviewed. The risks and benefits                         of the procedure and the sedation options and risks                         were discussed with the patient. All questions were                         answered, and informed consent was obtained. Prior                         Anticoagulants: The patient has taken no previous                         anticoagulant or antiplatelet agents. ASA Grade                         Assessment: II - A patient with mild systemic disease.                         After reviewing the risks and benefits, the patient                         was deemed in satisfactory condition to undergo the                         procedure.  After obtaining informed consent, the colonoscope was                         passed under direct vision. Throughout the procedure,                         the patient's blood pressure, pulse, and oxygen                         saturations were monitored continuously. The                          Colonoscope was introduced through the anus and                         advanced to the the cecum, identified by appendiceal                         orifice and ileocecal valve. The colonoscopy was                         performed without difficulty. The patient tolerated                         the procedure well. The quality of the bowel                         preparation was excellent. Findings:      The perianal and digital rectal examinations were normal.      Two sessile polyps were found in the transverse colon. The polyps were 3       to 6 mm in size. These polyps were removed with a cold snare. Resection       and retrieval were complete.      Non-bleeding internal hemorrhoids were found during retroflexion. The       hemorrhoids were Grade I (internal hemorrhoids that do not prolapse). Impression:            - Two 3 to 6 mm polyps in the transverse colon,                         removed with a cold snare. Resected and retrieved.                        - Non-bleeding internal hemorrhoids. Recommendation:        - Discharge patient to home.                        - Resume previous diet.                        - Continue present medications.                        - Await pathology results.                        - Repeat colonoscopy in 5 years for surveillance. Procedure Code(s):     --- Professional ---  45385, Colonoscopy, flexible; with removal of                         tumor(s), polyp(s), or other lesion(s) by snare                         technique Diagnosis Code(s):     --- Professional ---                        Z86.010, Personal history of colonic polyps                        K63.5, Polyp of colon CPT copyright 2019 American Medical Association. All rights reserved. The codes documented in this report are preliminary and upon coder review may  be revised to meet current compliance requirements. Lucilla Lame MD,  MD 10/05/2019 9:25:44 AM This report has been signed electronically. Number of Addenda: 0 Note Initiated On: 10/05/2019 8:53 AM Scope Withdrawal Time: 0 hours 7 minutes 38 seconds  Total Procedure Duration: 0 hours 9 minutes 27 seconds  Estimated Blood Loss:  Estimated blood loss: none.      [facility]

## 2019-10-05 NOTE — H&P (Signed)
Andrea Lame, MD [address] [place], [place] [address] Phone:[phone] Fax : [phone]  Primary Care Physician:  Cletis [place], MD Primary Gastroenterologist:  Dr. Allen Norris  Pre-Procedure History & Physical: HPI:  Andrea [name] is a 54 y.o. female is here for an endoscopy and colonoscopy.   Past Medical History:  Diagnosis Date  . Anxiety    panic attacks, chest pain  . Arthritis   . COPD (chronic obstructive pulmonary disease) ([place])   . GERD (gastroesophageal reflux disease)   . Hypertension    not medicated  . Non-obstructive CAD in native artery    a. cardiac cath 01/2014: showed minor irregularities, mildly elevated left ventricular end-diastolic pressure and normal ejection fraction; b. 03/2015 MV: low risk.  . Palpitations    a. 08/2016 Event Monitor: occas PVC's, two brief runs of SVT (4 and 7 beats).  Marland Kitchen PONV (postoperative nausea and vomiting)   . Symptomatic PVC's (premature ventricular contractions)   . Tobacco abuse   . Wears dentures    partial upper and lower    Past Surgical History:  Procedure Laterality Date  . CARDIAC CATHETERIZATION  01/03/14  . CARDIAC CATHETERIZATION    . ESOPHAGOGASTRODUODENOSCOPY (EGD) WITH PROPOFOL N/A 07/11/2016   Procedure: ESOPHAGOGASTRODUODENOSCOPY (EGD) WITH PROPOFOL;  Surgeon: Andrea Lame, MD;  Location: [place];  Service: Endoscopy;  Laterality: N/A;  . EYE SURGERY    . IMAGE GUIDED SINUS SURGERY N/A 11/30/2014   Procedure: IMAGE GUIDED SINUS SURGERY;  Surgeon: Carloyn Manner, MD;  Location: [facility];  Service: ENT;  Laterality: N/A;  GAVE DISK TO CE CE  . MAXILLARY ANTROSTOMY Bilateral 11/30/2014   Procedure: MAXILLARY ANTROSTOMY;  Surgeon: Carloyn Manner, MD;  Location: [place];  Service: ENT;  Laterality: Bilateral;  . SEPTOPLASTY N/A 11/30/2014   Procedure: SEPTOPLASTY;  Surgeon: Carloyn Manner, MD;  Location: [facility];  Service: ENT;  Laterality: N/A;    . SPHENOIDECTOMY Left 11/30/2014   Procedure: Coralee Pesa, ;  Surgeon: Carloyn Manner, MD;  Location: [facility];  Service: ENT;  Laterality: Left;  Marland Kitchen VAGINAL HYSTERECTOMY  1995   vaginal    Prior to Admission medications   Medication Sig Start Date End Date Taking? Authorizing Provider  albuterol (PROVENTIL) (2.5 MG/3ML) 0.083% nebulizer solution Take 3 mLs (2.5 mg total) by nebulization every 4 (four) hours as needed for wheezing or shortness of breath. Reported on 03/19/2015 03/19/15  Yes Theodoro Grist, MD  albuterol (VENTOLIN HFA) 108 (90 Base) MCG/ACT inhaler Inhale 2 puffs into the lungs every 6 (six) hours as needed for wheezing or shortness of breath. 05/13/19  Yes Tyler Pita, MD  ARIPiprazole (ABILIFY) 5 MG tablet Take 5 mg by mouth daily as needed (anxiety and depression).    Yes [provider]  azelastine (OPTIVAR) 0.05 % ophthalmic solution Place 1 drop into the left eye 2 (two) times daily. 06/04/19  Yes Birdie Sons, MD  Budeson-Glycopyrrol-Formoterol (BREZTRI AEROSPHERE) 160-9-4.8 MCG/ACT AERO Inhale 2 puffs into the lungs in the morning and at bedtime. 05/13/19  Yes Tyler Pita, MD  clonazePAM (KLONOPIN) 1 MG tablet TAKE 1 TABLET (1 MG TOTAL) BY MOUTH 2 (TWO) TIMES DAILY AS NEEDED. 07/18/19  Yes Birdie Sons, MD  dexlansoprazole (DEXILANT) 60 MG capsule Take 1 capsule (60 mg total) by mouth daily. **PLEASE SCHEDULE 1 YEAR FOLLOW UP APPT** 08/18/19  Yes Andrea Lame, MD  hydroxychloroquine (PLAQUENIL) 200 MG tablet TAKE 2 TABLETS (400 MG TOTAL) BY  MOUTH DAILY. FOR DISCOID LUPUS (L93.0) 09/29/19  Yes Birdie Sons, MD  nitroGLYCERIN (NITROSTAT) 0.4 MG SL tablet Place 1 tablet (0.4 mg total) under the tongue every 5 (five) minutes as needed for chest pain. 12/08/14  Yes Wellington Hampshire, MD  predniSONE (STERAPRED UNI-PAK 21 TAB) 10 MG (21) TBPK tablet Use as directed. 09/29/19  Yes Tyler Pita, MD  propranolol (INDERAL) 20 MG tablet Take  1 tablet (20 mg total) by mouth 2 (two) times daily. (BETA BLOCKER) Patient taking differently: Take 20 mg by mouth daily. Taking 1 tablet daily (BETA BLOCKER) 07/29/18  Yes Birdie Sons, MD  ALPRAZolam Duanne Moron) 1 MG tablet TAKE 1 TABLET BY MOUTH EVERY 8 HOURS AS NEEDED 05/21/18   Birdie Sons, MD  Budeson-Glycopyrrol-Formoterol (BREZTRI AEROSPHERE) 160-9-4.8 MCG/ACT AERO Inhale 2 puffs into the lungs in the morning and at bedtime. 05/13/19   Tyler Pita, MD  Respiratory Therapy Supplies (FLUTTER) DEVI 1 Device by Does not apply route daily. 03/19/15   Theodoro Grist, MD    Allergies as of 08/16/2019 - Review Complete 08/16/2019  Allergen Reaction Noted  . Alum & mag hydroxide-simeth Diarrhea and Nausea Only 11/02/2014  . Aspirin  07/23/2018  . Cefdinir Other (See Comments) 07/06/2013  . Chantix [varenicline]  06/11/2016  . Doxycycline Other (See Comments) 11/18/2013  . Multaq [dronedarone] Other (See Comments) 07/22/2017  . Ivp dye [iodinated diagnostic agents] Palpitations 05/20/2013    Family History  Problem Relation Age of Onset  . Emphysema Father   . Asthma Father   . Heart disease Father   . Heart attack Father   . Arthritis Father        RA  . Colon cancer Father   . Hypertension Mother   . Breast cancer Neg Hx     Social History   Socioeconomic History  . Marital status: Married    Spouse name: Not on file  . Number of children: 3  . Years of education: Not on file  . Highest education level: GED or equivalent  Occupational History  . Occupation: Disabled    Comment: Social Security as of 05/21/2011  Tobacco Use  . Smoking status: Current Every Day Smoker    Packs/day: 0.50    Years: 16.00    Pack years: 8.00    Types: Cigarettes  . Smokeless tobacco: Never Used  . Tobacco comment: 0.5 pack daily-05/13/2019  Vaping Use  . Vaping Use: Never used  Substance and Sexual Activity  . Alcohol use: No    Alcohol/week: 0.0 standard drinks  . Drug use:  No  . Sexual activity: Not on file  Other Topics Concern  . Not on file  Social History Narrative   Lives at home with husband. Independent at baseline.   Social Determinants of Health   Financial Resource Strain: Low Risk   . Difficulty of Paying Living Expenses: Not hard at all  Food Insecurity: No Food Insecurity  . Worried About Charity fundraiser in the Last Year: Never true  . Ran Out of Food in the Last Year: Never true  Transportation Needs: No Transportation Needs  . Lack of Transportation (Medical): No  . Lack of Transportation (Non-Medical): No  Physical Activity: Inactive  . Days of Exercise per Week: 0 days  . Minutes of Exercise per Session: 0 min  Stress: Stress Concern Present  . Feeling of Stress : Rather much  Social Connections: Moderately Isolated  . Frequency of Communication with Friends  and Family: More than three times a week  . Frequency of Social Gatherings with Friends and Family: Once a week  . Attends Religious Services: Never  . Active Member of Clubs or Organizations: No  . Attends Archivist Meetings: Never  . Marital Status: Married  Human resources officer Violence: Not At Risk  . Fear of Current or Ex-Partner: No  . Emotionally Abused: No  . Physically Abused: No  . Sexually Abused: No    Review of Systems: See HPI, otherwise negative ROS  Physical Exam: BP 129/77   Pulse 85   Temp (!) 96.9 F (36.1 C) (Temporal)   Resp 18   Ht 5\' 3"  (1.6 m)   Wt 72.6 kg   LMP  (Exact Date)   SpO2 100%   BMI 28.34 kg/m  General:   Alert,  pleasant and cooperative in NAD Head:  Normocephalic and atraumatic. Neck:  Supple; no masses or thyromegaly. Lungs:  Clear throughout to auscultation.    Heart:  Regular rate and rhythm. Abdomen:  Soft, nontender and nondistended. Normal bowel sounds, without guarding, and without rebound.   Neurologic:  Alert and  oriented x4;  grossly normal neurologically.  Impression/Plan: Andrea [name] is here  for an endoscopy and colonoscopy to be performed for gerd and a history of adenomatous polyps in 2015  Risks, benefits, limitations, and alternatives regarding  endoscopy and colonoscopy have been reviewed with the patient.  Questions have been answered.  All parties agreeable.   Andrea Lame, MD  10/05/2019, 8:31 AM

## 2019-10-06 ENCOUNTER — Encounter: Payer: Self-pay | Admitting: Gastroenterology

## 2019-10-06 LAB — SURGICAL PATHOLOGY

## 2019-10-06 NOTE — Anesthesia Postprocedure Evaluation (Signed)
Anesthesia Post Note  Patient: Andrea [name]  Procedure(s) Performed: COLONOSCOPY WITH PROPOFOL (N/A ) ESOPHAGOGASTRODUODENOSCOPY (EGD) WITH PROPOFOL (N/A )  Patient location during evaluation: PACU Anesthesia Type: General Level of consciousness: awake and alert Pain management: pain level controlled Vital Signs Assessment: post-procedure vital signs reviewed and stable Respiratory status: spontaneous breathing, nonlabored ventilation, respiratory function stable and patient connected to nasal cannula oxygen Cardiovascular status: blood pressure returned to baseline and stable Postop Assessment: no apparent nausea or vomiting Anesthetic complications: no   No complications documented.   Last Vitals:  Vitals:   10/05/19 0937 10/05/19 0947  BP: 113/70 121/72  Pulse: 90 83  Resp: (!) 26 17  Temp:    SpO2: 99% 100%    Last Pain:  Vitals:   10/06/19 0813  TempSrc:   PainSc: 0-No pain                 Molli Barrows

## 2019-10-08 ENCOUNTER — Ambulatory Visit: Payer: Medicare Other | Admitting: Internal Medicine

## 2019-10-08 ENCOUNTER — Encounter: Payer: Self-pay | Admitting: Gastroenterology

## 2019-10-08 ENCOUNTER — Encounter: Payer: Self-pay | Admitting: Internal Medicine

## 2019-10-08 ENCOUNTER — Other Ambulatory Visit: Payer: Self-pay

## 2019-10-11 ENCOUNTER — Ambulatory Visit: Payer: Medicare Other | Admitting: Family Medicine

## 2019-10-18 ENCOUNTER — Ambulatory Visit: Payer: Medicare Other | Admitting: Acute Care

## 2019-10-28 ENCOUNTER — Ambulatory Visit: Payer: Medicare Other | Admitting: Pulmonary Disease

## 2019-10-31 DIAGNOSIS — H5789 Other specified disorders of eye and adnexa: Secondary | ICD-10-CM | POA: Diagnosis not present

## 2019-11-01 ENCOUNTER — Ambulatory Visit: Payer: Medicare Other | Admitting: Surgery

## 2019-11-01 DIAGNOSIS — M3219 Other organ or system involvement in systemic lupus erythematosus: Secondary | ICD-10-CM | POA: Diagnosis not present

## 2019-11-05 ENCOUNTER — Ambulatory Visit: Payer: Medicare Other | Admitting: Internal Medicine

## 2019-11-09 ENCOUNTER — Ambulatory Visit: Payer: Medicare Other | Admitting: Family Medicine

## 2019-11-09 DIAGNOSIS — H2000 Unspecified acute and subacute iridocyclitis: Secondary | ICD-10-CM | POA: Diagnosis not present

## 2019-11-12 ENCOUNTER — Telehealth: Payer: Self-pay

## 2019-11-12 NOTE — Telephone Encounter (Signed)
Copied from [place] [id number]. Topic: General - Other >> Nov 12, 2019 11:36 AM Leward Quan A wrote: Reason for CRM: Patient called to check up about a referral to [facility]. She would like a call back today if possible please. Can be reached at Ph# [phone]

## 2019-11-15 ENCOUNTER — Encounter: Payer: Self-pay | Admitting: Family Medicine

## 2019-11-15 ENCOUNTER — Ambulatory Visit (INDEPENDENT_AMBULATORY_CARE_PROVIDER_SITE_OTHER): Payer: Medicare Other | Admitting: Family Medicine

## 2019-11-15 ENCOUNTER — Other Ambulatory Visit: Payer: Self-pay

## 2019-11-15 VITALS — BP 124/76 | HR 88 | Temp 98.2°F | Resp 18 | Wt 159.0 lb

## 2019-11-15 DIAGNOSIS — H209 Unspecified iridocyclitis: Secondary | ICD-10-CM

## 2019-11-15 DIAGNOSIS — H5462 Unqualified visual loss, left eye, normal vision right eye: Secondary | ICD-10-CM

## 2019-11-15 DIAGNOSIS — R002 Palpitations: Secondary | ICD-10-CM

## 2019-11-15 DIAGNOSIS — J449 Chronic obstructive pulmonary disease, unspecified: Secondary | ICD-10-CM

## 2019-11-15 DIAGNOSIS — F419 Anxiety disorder, unspecified: Secondary | ICD-10-CM

## 2019-11-15 DIAGNOSIS — L930 Discoid lupus erythematosus: Secondary | ICD-10-CM | POA: Diagnosis not present

## 2019-11-15 NOTE — Progress Notes (Signed)
I,Roshena L Chambers,acting as a scribe for Lelon Huh, MD.,have documented all relevant documentation on the behalf of Lelon Huh, MD,as directed by  Lelon Huh, MD while in the presence of Lelon Huh, MD.  Established patient visit   Patient: Andrea [name]   DOB: 1965-04-19   54 y.o. Female  MRN: [id number] Visit Date: 11/15/2019  Today's healthcare provider: Lelon Huh, MD   Chief Complaint  Patient presents with  . Hypertension   Subjective    HPI  Follow up palpitations She continue on propranolol which she is tolerating well. She still has palpitations occasionally, but much better on propranolol.   She continue on hydroxychloroquine for discoid lupus which she is tolerating well. She no longer sees rheumatology due to expense.   She continues regular follow up with pulmonology, Dr. Patsey Berthold for COPD. Using [place] consistently, but still has to use albuterol a few times every week. She does continue to smoke.   --------------------------------------------------------------------------------------------------- She also reports she has been going to [facility] for the last 3 weeks for uveitis and has been on steroid eye drops without improvement. She was advised by Dr. Neville Route that she needs to go to retina specialist at [facility] but needs referral from PCP. Her last ophthalmology appointment was six days ago. She states she does not see detail, only shadows from the affected eye.       Medications: Outpatient Medications Prior to Visit  Medication Sig  . albuterol (VENTOLIN HFA) 108 (90 Base) MCG/ACT inhaler Inhale 2 puffs into the lungs every 6 (six) hours as needed for wheezing or shortness of breath.  . ALPRAZolam (XANAX) 1 MG tablet TAKE 1 TABLET BY MOUTH EVERY 8 HOURS AS NEEDED  . ARIPiprazole (ABILIFY) 5 MG tablet Take 5 mg by mouth daily as needed (anxiety and depression).   . Budeson-Glycopyrrol-Formoterol (BREZTRI AEROSPHERE) 160-9-4.8  MCG/ACT AERO Inhale 2 puffs into the lungs in the morning and at bedtime.  . clonazePAM (KLONOPIN) 1 MG tablet TAKE 1 TABLET (1 MG TOTAL) BY MOUTH 2 (TWO) TIMES DAILY AS NEEDED.  Marland Kitchen dexlansoprazole (DEXILANT) 60 MG capsule Take 1 capsule (60 mg total) by mouth daily. **PLEASE SCHEDULE 1 YEAR FOLLOW UP APPT**  . DUREZOL 0.05 % EMUL Place 1 drop into the left eye 5 (five) times daily.  . hydroxychloroquine (PLAQUENIL) 200 MG tablet TAKE 2 TABLETS (400 MG TOTAL) BY MOUTH DAILY. FOR DISCOID LUPUS (L93.0)  . nitroGLYCERIN (NITROSTAT) 0.4 MG SL tablet Place 1 tablet (0.4 mg total) under the tongue every 5 (five) minutes as needed for chest pain.  Marland Kitchen propranolol (INDERAL) 20 MG tablet Take 1 tablet (20 mg total) by mouth 2 (two) times daily. (BETA BLOCKER) (Patient taking differently: Take 20 mg by mouth daily. Taking 1 tablet daily (BETA BLOCKER))  . Respiratory Therapy Supplies (FLUTTER) DEVI 1 Device by Does not apply route daily.  . [DISCONTINUED] azelastine (OPTIVAR) 0.05 % ophthalmic solution Place 1 drop into the left eye 2 (two) times daily.   No facility-administered medications prior to visit.      Objective    BP 124/76 (BP Location: Left Arm, Patient Position: Sitting, Cuff Size: Normal)   Pulse 88   Temp 98.2 F (36.8 C) (Oral)   Resp 18   Wt 159 lb (72.1 kg)   BMI 28.17 kg/m   Physical Exam  General appearance:  Left eye moderately injected.  Head: Normocephalic, without obvious abnormality, atraumatic Respiratory: Respirations even and unlabored, normal respiratory rate Extremities:  All extremities are intact.  Skin: Skin color, texture, turgor normal. No rashes seen  Psych: Appropriate mood and affect. Neurologic: Mental status: Alert, oriented to person, place, and time, thought content appropriate.   No results found for any visits on 11/15/19.  Assessment & Plan     1. Palpitations Well controlled on betablocker.   2. Discoid lupus Continue hydroxychlorquine. UTD  on labs, EKG and followed by pulmonary.   3. COPD, GOLD B Stable on current inhalers, followed by Dr. Patsey Berthold.   4. Anxiety Continue current regiment of anxiolytics.   5. Vision loss of left eye   6. Anterior uveitis Per Dr. Neville [facility] at [facility] she has failed treatment with ocular steroid and needs urgent referral to [facility] retinal specialist.  - Ambulatory referral to Ophthalmology        The entirety of the information documented in the History of Present Illness, Review of Systems and Physical Exam were personally obtained by me. Portions of this information were initially documented by the CMA and reviewed by me for thoroughness and accuracy.      Lelon Huh, MD  [facility] [phone] (phone) [phone] (fax)  [place]

## 2019-11-15 NOTE — Patient Instructions (Signed)
Please call the [facility] ([phone]) to schedule a routine screening mammogram.  

## 2019-11-17 ENCOUNTER — Ambulatory Visit (INDEPENDENT_AMBULATORY_CARE_PROVIDER_SITE_OTHER): Payer: Medicare Other | Admitting: Surgery

## 2019-11-17 ENCOUNTER — Encounter: Payer: Self-pay | Admitting: Surgery

## 2019-11-17 ENCOUNTER — Other Ambulatory Visit: Payer: Self-pay

## 2019-11-17 VITALS — BP 147/89 | HR 92 | Temp 98.1°F | Resp 12 | Ht 63.0 in | Wt 156.4 lb

## 2019-11-17 DIAGNOSIS — Z1211 Encounter for screening for malignant neoplasm of colon: Secondary | ICD-10-CM

## 2019-11-17 DIAGNOSIS — K449 Diaphragmatic hernia without obstruction or gangrene: Secondary | ICD-10-CM

## 2019-11-17 DIAGNOSIS — Z1231 Encounter for screening mammogram for malignant neoplasm of breast: Secondary | ICD-10-CM | POA: Diagnosis not present

## 2019-11-17 NOTE — Patient Instructions (Addendum)
We will get you scheduled for a Barium Swallow, a mammogram and a CT Abdomen and Pelvis.   We will schedule a follow up with Dr Dahlia Byes. Call the office if you have questions or concerns.

## 2019-11-18 ENCOUNTER — Encounter: Payer: Self-pay | Admitting: Surgery

## 2019-11-18 ENCOUNTER — Telehealth: Payer: Self-pay | Admitting: Emergency Medicine

## 2019-11-18 ENCOUNTER — Other Ambulatory Visit (HOSPITAL_COMMUNITY): Payer: Self-pay | Admitting: Surgery

## 2019-11-18 NOTE — Progress Notes (Signed)
Patient ID: Andrea [name], female   DOB: 1965-08-17, 53 y.o.   MRN: [id number]  HPI Andrea [name] is a 54 y.o. female seen in consultation at the request of Dr.Wohl with a long history of reflux disease as well as hiatal hernia.  She is currently on Dexilant and has still some reflux symptoms.  She reports that her symptoms are worsening when she lays on her back.  She also describes that she has exacerbated cough early in the morning and she can feel some acid taste in her mouth.  She also has a history of COPD and is currently actively smoking.  She does follow with Dr. Patsey Berthold from pulmonary medicine and she was also in agreement that her reflux disease was contributing to her pulmonary symptoms.  She was recently scoped by Dr.Wohl and I have personally reviewed the images for endoscopy showing evidence of a hiatal hernia.  She also had a prior CT scan of the chest last year that have personally reviewed showing evidence of emphysema and there is also a small to moderate hiatal hernia.  She does have palpitations and she is being followed by Dr. Fletcher Anon from cardiology.  She also had a cardiac cath in 2015 showing evidence of mild coronary artery disease and a preserved ejection fraction.  She is able to walk for a couple blocks and sometimes does have dyspnea on exertion due to her COPD. She also reports some intermittent abdominal pain in the epigastric area that is mild to moderate intensity and sharp in nature.  There is no specific alleviating or aggravating factors. Her CBC and her CMP 5 months ago were completely normal.  HPI  Past Medical History:  Diagnosis Date  . Anxiety    panic attacks, chest pain  . Arthritis   . COPD (chronic obstructive pulmonary disease) ([place])   . GERD (gastroesophageal reflux disease)   . Hypertension    not medicated  . Non-obstructive CAD in native artery    a. cardiac cath 01/2014: showed minor irregularities, mildly elevated left ventricular end-diastolic  pressure and normal ejection fraction; b. 03/2015 MV: low risk.  . Palpitations    a. 08/2016 Event Monitor: occas PVC's, two brief runs of SVT (4 and 7 beats).  Marland Kitchen PONV (postoperative nausea and vomiting)   . Symptomatic PVC's (premature ventricular contractions)   . Tobacco abuse   . Wears dentures    partial upper and lower    Past Surgical History:  Procedure Laterality Date  . CARDIAC CATHETERIZATION  01/03/14  . CARDIAC CATHETERIZATION    . CHOLECYSTECTOMY    . COLONOSCOPY WITH PROPOFOL N/A 10/05/2019   Procedure: COLONOSCOPY WITH PROPOFOL;  Surgeon: Lucilla Lame, MD;  Location: [facility] ENDOSCOPY;  Service: Endoscopy;  Laterality: N/A;  . ESOPHAGOGASTRODUODENOSCOPY (EGD) WITH PROPOFOL N/A 07/11/2016   Procedure: ESOPHAGOGASTRODUODENOSCOPY (EGD) WITH PROPOFOL;  Surgeon: Lucilla Lame, MD;  Location: [place];  Service: Endoscopy;  Laterality: N/A;  . ESOPHAGOGASTRODUODENOSCOPY (EGD) WITH PROPOFOL N/A 10/05/2019   Procedure: ESOPHAGOGASTRODUODENOSCOPY (EGD) WITH PROPOFOL;  Surgeon: Lucilla Lame, MD;  Location: [facility] ENDOSCOPY;  Service: Endoscopy;  Laterality: N/A;  . EYE SURGERY    . IMAGE GUIDED SINUS SURGERY N/A 11/30/2014   Procedure: IMAGE GUIDED SINUS SURGERY;  Surgeon: Carloyn Manner, MD;  Location: [place];  Service: ENT;  Laterality: N/A;  GAVE DISK TO CE CE  . MAXILLARY ANTROSTOMY Bilateral 11/30/2014   Procedure: MAXILLARY ANTROSTOMY;  Surgeon: Carloyn Manner, MD;  Location: [place];  Service:  ENT;  Laterality: Bilateral;  . SEPTOPLASTY N/A 11/30/2014   Procedure: SEPTOPLASTY;  Surgeon: Carloyn Manner, MD;  Location: [place];  Service: ENT;  Laterality: N/A;  . SPHENOIDECTOMY Left 11/30/2014   Procedure: Coralee Pesa, ;  Surgeon: Carloyn Manner, MD;  Location: [place];  Service: ENT;  Laterality: Left;  Marland Kitchen VAGINAL HYSTERECTOMY  1995   vaginal    Family History  Problem Relation Age of Onset  . Emphysema Father   .  Asthma Father   . Heart disease Father   . Heart attack Father   . Arthritis Father        RA  . Colon cancer Father   . Hypertension Mother   . Breast cancer Neg Hx     Social History Social History   Tobacco Use  . Smoking status: Current Every Day Smoker    Packs/day: 0.50    Years: 16.00    Pack years: 8.00    Types: Cigarettes  . Smokeless tobacco: Never Used  . Tobacco comment: 0.5 pack daily-05/13/2019  Vaping Use  . Vaping Use: Never used  Substance Use Topics  . Alcohol use: No    Alcohol/week: 0.0 standard drinks  . Drug use: No    Allergies  Allergen Reactions  . Alum & Mag Hydroxide-Simeth Diarrhea and Nausea Only  . Aspirin     Nosebleed   . Cefdinir Other (See Comments)    tachycardia  . Chantix [Varenicline]     Bad dreams  . Doxycycline Other (See Comments)    Nausea, migraine  . Multaq [Dronedarone] Other (See Comments)    Lip/ mouth numbness/ tongue swelling  . Ivp Dye [Iodinated Diagnostic Agents] Palpitations    Current Outpatient Medications  Medication Sig Dispense Refill  . albuterol (VENTOLIN HFA) 108 (90 Base) MCG/ACT inhaler Inhale 2 puffs into the lungs every 6 (six) hours as needed for wheezing or shortness of breath. 18 g 6  . ALPRAZolam (XANAX) 1 MG tablet TAKE 1 TABLET BY MOUTH EVERY 8 HOURS AS NEEDED 60 tablet 5  . ARIPiprazole (ABILIFY) 5 MG tablet Take 5 mg by mouth daily as needed (anxiety and depression).     . Budeson-Glycopyrrol-Formoterol (BREZTRI AEROSPHERE) 160-9-4.8 MCG/ACT AERO Inhale 2 puffs into the lungs in the morning and at bedtime. 10.7 g 0  . clonazePAM (KLONOPIN) 1 MG tablet TAKE 1 TABLET (1 MG TOTAL) BY MOUTH 2 (TWO) TIMES DAILY AS NEEDED. 60 tablet 3  . dexlansoprazole (DEXILANT) 60 MG capsule Take 1 capsule (60 mg total) by mouth daily. **PLEASE SCHEDULE 1 YEAR FOLLOW UP APPT** 90 capsule 1  . DUREZOL 0.05 % EMUL Place 1 drop into the left eye 5 (five) times daily.    . hydroxychloroquine (PLAQUENIL) 200 MG  tablet TAKE 2 TABLETS (400 MG TOTAL) BY MOUTH DAILY. FOR DISCOID LUPUS (L93.0) 180 tablet 0  . nitroGLYCERIN (NITROSTAT) 0.4 MG SL tablet Place 1 tablet (0.4 mg total) under the tongue every 5 (five) minutes as needed for chest pain. 25 tablet 0  . propranolol (INDERAL) 20 MG tablet Take 1 tablet (20 mg total) by mouth 2 (two) times daily. (BETA BLOCKER) (Patient taking differently: Take 20 mg by mouth daily. Taking 1 tablet daily (BETA BLOCKER)) 60 tablet 4  . Respiratory Therapy Supplies (FLUTTER) DEVI 1 Device by Does not apply route daily. 1 each 0   No current facility-administered medications for this visit.     Review of Systems Full ROS  was asked and was negative  except for the information on the HPI  Physical Exam Blood pressure (!) 147/89, pulse 92, temperature 98.1 F (36.7 C), resp. rate 12, height 5\' 3"  (1.6 m), weight 156 lb 6.4 oz (70.9 kg), SpO2 95 %. CONSTITUTIONAL: NAD EYES: Pupils are equal, round,, Sclera are non-icteric. EARS, NOSE, MOUTH AND THROAT: She is wearing a mask. Hearing is intact to voice. LYMPH NODES:  Lymph nodes in the neck are normal. RESPIRATORY:  Lungs are clear. There is normal respiratory effort, with equal breath sounds bilaterally, and without pathologic use of accessory muscles. CARDIOVASCULAR: Heart is regular without murmurs, gallops, or rubs. GI: The abdomen is  soft, there is tenderness to palpation in the epigastric area and above the umbilicus within the midline.  There is however no evidence of hernial defects, and nondistended. There are no palpable masses. There is no hepatosplenomegaly. There are normal bowel sounds in all quadrants. GU: Rectal deferred.   MUSCULOSKELETAL: Normal muscle strength and tone. No cyanosis or edema.   SKIN: Turgor is good and there are no pathologic skin lesions or ulcers. NEUROLOGIC: Motor and sensation is grossly normal. Cranial nerves are grossly intact. PSYCH:  Oriented to person, place and time. Affect is  normal.  Data Reviewed  I have personally reviewed the patient's imaging, laboratory findings and medical records.    Assessment/Plan 54 year old female with a symptomatic hiatal hernia and chronic cough and chronic respiratory issues.  We will start work-up with a barium swallow as well as a CT scan of abdomen pelvis.  In addition to this we will request a mammogram since has been more than 3 years.  I do think that given her symptomatology and her chronic pulmonary symptoms she will be a reasonable candidate for repair of hiatal hernia and antireflux operation.  We had discussion with her regarding what this will entail.  The outcomes and the nature of the operation.  She is very interested in pursuing this route.  We will establish first a baseline and appropriate anatomical evaluation and then we will have another discussion with patient in detail regarding potential surgical options.  Extensive counseling provided.  Smoking cessation was advised as well A copy of this report was sent to the referring provider Please note that I spent more than 60 minutes in this encounter with greater than 50% spent in coordination and counseling of her care  Caroleen Hamman, MD FACS General Surgeon 11/18/2019, 2:31 PM

## 2019-11-18 NOTE — Telephone Encounter (Signed)
Called pt and advised of following appts:  [facility] 815am. NPO 3 hours prior  CT A&P wo contrast (oral only): 12/03/19 at 9am at outpatient imaging center. Arrival time 845am. NPO 4 hrs prior. Pick up prep kit before  Mammo: 12/06/19 at 1040am at the [facility] breast center  F/u [place]: 12/15/19 at 215pm.   Pt voiced understanding.

## 2019-11-22 ENCOUNTER — Ambulatory Visit: Payer: Medicare Other | Admitting: Surgery

## 2019-11-23 ENCOUNTER — Ambulatory Visit: Payer: Medicare Other

## 2019-11-23 DIAGNOSIS — H21512 Anterior synechiae (iris), left eye: Secondary | ICD-10-CM | POA: Diagnosis not present

## 2019-11-23 DIAGNOSIS — M329 Systemic lupus erythematosus, unspecified: Secondary | ICD-10-CM | POA: Diagnosis not present

## 2019-11-23 DIAGNOSIS — H11133 Conjunctival pigmentations, bilateral: Secondary | ICD-10-CM | POA: Diagnosis not present

## 2019-11-23 DIAGNOSIS — H2013 Chronic iridocyclitis, bilateral: Secondary | ICD-10-CM | POA: Diagnosis not present

## 2019-11-24 ENCOUNTER — Encounter: Payer: Self-pay | Admitting: Family Medicine

## 2019-11-24 ENCOUNTER — Other Ambulatory Visit: Payer: Self-pay | Admitting: Family Medicine

## 2019-11-24 DIAGNOSIS — L03119 Cellulitis of unspecified part of limb: Secondary | ICD-10-CM

## 2019-11-24 NOTE — Telephone Encounter (Signed)
Requested medication (s) are due for refill today: yes  Requested medication (s) are on the active medication list: yes  Last refill:  10/16/19  Future visit scheduled: yes  Notes to clinic:  not delegated    Requested Prescriptions  Pending Prescriptions Disp Refills   clonazePAM (KLONOPIN) 1 MG tablet [Pharmacy Med Name: CLONAZEPAM 1 MG TABLET] 60 tablet     Sig: TAKE 1 TABLET (1 MG TOTAL) BY MOUTH 2 (TWO) TIMES DAILY AS NEEDED.      Not Delegated - Psychiatry:  Anxiolytics/Hypnotics Failed - 11/24/2019  8:17 AM      Failed - This refill cannot be delegated      Failed - Urine Drug Screen completed in last 360 days.      Passed - Valid encounter within last 6 months    Recent Outpatient Visits           1 week ago [place], Donald E, MD   5 months ago [place], Donald E, MD   1 year ago Acute recurrent pansinusitis   [place], [place]   1 year ago Chest pain, unspecified type   [facility] Birdie Sons, MD   2 years ago Acute maxillary sinusitis, recurrence not specified   [facility] Birdie Sons, MD       Future Appointments             In 3 weeks Tyler Pita, MD [facility] [place]   In 6 months Fisher, Kirstie Peri, MD [facility], [place]

## 2019-12-03 ENCOUNTER — Ambulatory Visit (INDEPENDENT_AMBULATORY_CARE_PROVIDER_SITE_OTHER): Payer: Medicare Other | Admitting: Pulmonary Disease

## 2019-12-03 ENCOUNTER — Other Ambulatory Visit: Payer: Self-pay

## 2019-12-03 ENCOUNTER — Ambulatory Visit: Payer: Medicare Other

## 2019-12-03 ENCOUNTER — Encounter: Payer: Self-pay | Admitting: Pulmonary Disease

## 2019-12-03 VITALS — BP 128/86 | HR 80 | Temp 97.5°F | Ht 64.0 in | Wt 156.2 lb

## 2019-12-03 DIAGNOSIS — F17210 Nicotine dependence, cigarettes, uncomplicated: Secondary | ICD-10-CM

## 2019-12-03 DIAGNOSIS — R0609 Other forms of dyspnea: Secondary | ICD-10-CM

## 2019-12-03 DIAGNOSIS — J449 Chronic obstructive pulmonary disease, unspecified: Secondary | ICD-10-CM

## 2019-12-03 DIAGNOSIS — R0600 Dyspnea, unspecified: Secondary | ICD-10-CM

## 2019-12-03 NOTE — Progress Notes (Signed)
Subjective:    Patient ID: Andrea [name], female    DOB: 10/27/1965, 54 y.o.   MRN: [id number]  HPI This is a 54 year old current smoker (three quarters to 1 PPD) previous patient of Dr. Ashby Dawes last evaluated by me in March 2021 at that time switch her to [facility] 2 puffs twice a day for management of her COPD.  Where in 2015, showing moderate/stage II COPD.  She has not had PFTs since then.  The patient was also followed for issues with multiple pulmonary nodules.  Her most recent CT scan was performed in October 2020 which showed resolution of the previously noted small pulmonary nodules consistent with resolving infection or inflammation.  She was supposed to be referred to the annual low-dose lung cancer screening however, she has not received confirmation of this.  There she has had 1 Moderna dose of of SARS-CoV-2 vaccine however, has been hesitant to get the second one.  The patient is currently on hydrochloric when for management of lupus.  However she has developed issues with uveitis and is in the process of likely switching to methotrexate.  Overall she states she has been doing well with [employer].  Feels that this helps her breathing better than any other inhaler.  She unfortunately continues to smoke as noted above.  She has not had any fevers, chills or sweats.  She always has a congested cough and wonders what she could use for thinning secretions.  She does note dyspnea on exertion.  She does not endorse any other symptomatology.  Review of Systems A 10 point review of systems was performed and it is as noted above otherwise negative.  Allergies  Allergen Reactions  . Alum & Mag Hydroxide-Simeth Diarrhea and Nausea Only  . Aspirin     Nosebleed   . Cefdinir Other (See Comments)    tachycardia  . Chantix [Varenicline]     Bad dreams  . Doxycycline Other (See Comments)    Nausea, migraine  . Multaq [Dronedarone] Other (See Comments)    Lip/ mouth numbness/ tongue swelling   . Ivp Dye [Iodinated Diagnostic Agents] Palpitations   Current Meds  Medication Sig  . albuterol (VENTOLIN HFA) 108 (90 Base) MCG/ACT inhaler Inhale 2 puffs into the lungs every 6 (six) hours as needed for wheezing or shortness of breath.  . ALPRAZolam (XANAX) 1 MG tablet TAKE 1 TABLET BY MOUTH EVERY 8 HOURS AS NEEDED  . ARIPiprazole (ABILIFY) 5 MG tablet Take 5 mg by mouth daily as needed (anxiety and depression).   . Budeson-Glycopyrrol-Formoterol (BREZTRI AEROSPHERE) 160-9-4.8 MCG/ACT AERO Inhale 2 puffs into the lungs in the morning and at bedtime.  . clonazePAM (KLONOPIN) 1 MG tablet TAKE 1 TABLET (1 MG TOTAL) BY MOUTH 2 (TWO) TIMES DAILY AS NEEDED.  Marland Kitchen dexlansoprazole (DEXILANT) 60 MG capsule Take 1 capsule (60 mg total) by mouth daily. **PLEASE SCHEDULE 1 YEAR FOLLOW UP APPT**  . DUREZOL 0.05 % EMUL Place 1 drop into the left eye 5 (five) times daily.  . hydroxychloroquine (PLAQUENIL) 200 MG tablet TAKE 2 TABLETS (400 MG TOTAL) BY MOUTH DAILY. FOR DISCOID LUPUS (L93.0)  . propranolol (INDERAL) 20 MG tablet Take 1 tablet (20 mg total) by mouth 2 (two) times daily. (BETA BLOCKER) (Patient taking differently: Take 20 mg by mouth daily. Taking 1 tablet daily (BETA BLOCKER))  . Respiratory Therapy Supplies (FLUTTER) DEVI 1 Device by Does not apply route daily.   Immunization History  Administered Date(s) Administered  . [place] SARS-COVID-2 Vaccination  11/28/2019  . Pneumococcal Conjugate-13 11/20/2012  . Pneumococcal Polysaccharide-23 08/10/2012   Social History   Tobacco Use  . Smoking status: Current Every Day Smoker    Packs/day: 0.50    Years: 16.00    Pack years: 8.00    Types: Cigarettes  . Smokeless tobacco: Never Used  . Tobacco comment: 0.5 pack daily-05/13/2019  Substance Use Topics  . Alcohol use: No    Alcohol/week: 0.0 standard drinks      Objective:   Physical Exam BP 128/86 (BP Location: Left Arm, Patient Position: Sitting, Cuff Size: Normal)   Pulse 80    Temp (!) 97.5 F (36.4 C) (Temporal)   Ht 5\' 4"  (1.626 m)   Wt 156 lb 3.2 oz (70.9 kg)   SpO2 98%   BMI 26.81 kg/m  GENERAL: Awake, alert, fully ambulatory. HEAD: Normocephalic, atraumatic.  EYES: Pupils equal, round, reactive to light.  No scleral icterus.  MOUTH: Nose/mouth/throat not examined due to masking requirements for COVID 19.   NECK: Supple. No thyromegaly. Trachea midline. No JVD.  No adenopathy. PULMONARY: Good air entry bilaterally.  There are coarse breath sounds and rhonchi throughout, no wheezes. CARDIOVASCULAR: S1 and S2. Regular rate and rhythm.  No rubs, murmurs or gallops heard. ABDOMEN: Benign. MUSCULOSKELETAL: No joint deformity, no clubbing, no edema.  NEUROLOGIC: No overt focal deficit.  Speech is fluent.  No gait disturbance. SKIN: Intact,warm,dry. PSYCH: Mood is anxious, behavior normal.   Ambulatory oximetry performed today: No oxygen desaturations.  Patient saturations remained between 95 to 97% throughout.    Assessment & Plan:     ICD-10-CM   1. Dyspnea on exertion  R06.00    PFTs Recommend discontinue smoking Ambulatory symmetry normal Consider switching to more selective beta-blocker   2. Stage 2 moderate COPD by GOLD classification ([place])  J44.9 Pulmonary Function Test [facility] Only   Reassess with PFT Continue Breztri 2 puffs twice a day STOP smoking  3. Tobacco dependence due to cigarettes  F17.210    Patient counseled with regards to discontinuation of smoking Total counseling time 3 to 5 minutes Referral to lung cancer screening program sent   Orders Placed This Encounter  Procedures  . Pulmonary Function Test [facility] Only    Standing Status:   Future    Standing Expiration Date:   12/02/2020    Order Specific Question:   Full PFT: includes the following: basic spirometry, spirometry pre & post bronchodilator, diffusion capacity (DLCO), lung volumes    Answer:   Full PFT    Order Specific Question:   This test can only be performed at     Answer:   [facility]   Discussion:  Patient appears to be stable from prior.  She could however be better compensated however this will require that she discontinue smoking.  This was discussed with the patient at length.  She has issues with tenacious secretions and we have recommended Mucinex 600 to 1200 mg twice a day as needed, also encouraged to increase p.o. fluids.  We will obtain PFTs to assess for potential progression of her COPD.  She has had issues with dyspnea on exertion of longstanding.  Note that she is on a nonselective beta-blocker, it would be highly advisable to switch the patient off of a nonselective beta-blocker if at all possible given her underlying COPD.  We will see the patient in follow-up in contact us prior to that time should any new difficulties arise.  Renold Don, MD [facility]   *  This note was dictated using voice recognition software/Dragon.  Despite best efforts to proofread, errors can occur which can change the meaning.  Any change was purely unintentional.

## 2019-12-03 NOTE — Patient Instructions (Addendum)
Continue Breztri plus twice a day  Try to use your albuterol at least 1 to 2 hours before bedtime to see if that helps with your wheezing.  You can use plain Mucinex twice a day to help you clear mucus.  Regular Mucinex 600 mg you can take up to 1200 mg twice a day they do make an extra strength tablet that you do not need to double up on.   Get your second Covid vaccine.   We will get breathing tests.   We wil enroll you in the lung cancer screening program.   We will see you in follow-up in 3 to 4 months time call sooner should any new problems arise.

## 2019-12-08 ENCOUNTER — Telehealth: Payer: Self-pay

## 2019-12-08 DIAGNOSIS — Z87891 Personal history of nicotine dependence: Secondary | ICD-10-CM

## 2019-12-08 DIAGNOSIS — Z122 Encounter for screening for malignant neoplasm of respiratory organs: Secondary | ICD-10-CM

## 2019-12-08 NOTE — Telephone Encounter (Signed)
Contacted patient today for lung CT screening program based on referral from Dr. Patsey Berthold.  Patient is agreeable to participate.  CT scan scheduled for Dec 1 at 10:00.  CT scan scheduled for December at patient request.  She is getting her second COVID shot near the end of October. She would like to wait to at least two weeks after 2nd shot to get scan.  I gave patient address to imaging center and phone number to [facility], lung navigator.  Patient is a current smoker and started smoking at age 38 after getting married.  She smokes 1/2 to 1 pack a day.  She is appreciative of the call.

## 2019-12-09 ENCOUNTER — Other Ambulatory Visit: Payer: Medicare Other

## 2019-12-09 MED ORDER — AZITHROMYCIN 250 MG PO TABS: ORAL_TABLET | ORAL | 0 refills | Status: AC

## 2019-12-09 NOTE — Telephone Encounter (Signed)
She may have mild skin infection. Skin infections are NOT from the vaccine, but a complication of having a needle stuck through the skin. Have sent prescription for azithromycin to [facility]

## 2019-12-09 NOTE — Telephone Encounter (Signed)
Current smoker, 30 pack year

## 2019-12-09 NOTE — Telephone Encounter (Signed)
Pt called stating that she drew a circle around the redness yesterday and that the redness has spread out from that circle. She states that the injection site is holding heat as well. She states that there is some swelling there as well. Please advise.

## 2019-12-09 NOTE — Addendum Note (Signed)
Addended by: Lieutenant Diego on: 12/09/2019 09:43 AM   Modules accepted: Orders

## 2019-12-10 ENCOUNTER — Ambulatory Visit: Payer: Medicare Other

## 2019-12-13 ENCOUNTER — Encounter: Payer: Self-pay | Admitting: Emergency Medicine

## 2019-12-13 ENCOUNTER — Telehealth: Payer: Self-pay | Admitting: Emergency Medicine

## 2019-12-13 NOTE — Telephone Encounter (Signed)
Per chart, patient r/s f/u appt with Dr Dahlia Byes to November. Prior to this appt, pt needs barium swallow, ct a&p and mammo rescheduled as planned. Called patient to ask why appts were cancelled and to reschedule. NO answer. Left vm to call the office back and also sent MyChart message.

## 2019-12-14 ENCOUNTER — Ambulatory Visit: Payer: Medicare Other | Admitting: Pulmonary Disease

## 2019-12-15 ENCOUNTER — Ambulatory Visit: Payer: Medicaid Other | Admitting: Surgery

## 2019-12-15 NOTE — Telephone Encounter (Signed)
Called patient x2 with no answer. Left vm to call back.

## 2019-12-20 ENCOUNTER — Ambulatory Visit: Payer: Medicare Other | Admitting: Pulmonary Disease

## 2019-12-28 ENCOUNTER — Telehealth: Payer: Self-pay | Admitting: Pulmonary Disease

## 2019-12-28 NOTE — Telephone Encounter (Signed)
12/28/2019  I reviewed patient's chart.  Last seen by Dr. Patsey Berthold on 12/03/2019.  Agree with plan to obtain pulmonary function testing as well as emphasis on stopping smoking.  Also agree on CT chest lung cancer screening.  I would continue to monitor temperatures.  A random temperature of 100 status post receiving her second Covid vaccine is okay.  If temperatures continue to persist or if she develops worsening shortness of breath or wheezing she needs to let us know.  Would recommend that she take an antipyretic of her choice such as Tylenol or ibuprofen for management of low-grade fever.  Continue inhalers.  Can continue Mucinex.  It is listed here the patient is still currently smoking.  Would recommend that she stop.  If she has additional or worsening symptoms she can contact our office.  I do not believe there is an indication for steroids or antibiotics at this time based off the information provided.  Wyn Quaker, FNP

## 2019-12-28 NOTE — Telephone Encounter (Signed)
Dr. Patsey Berthold patient last seen 12/03/2019 for COPD.  Pending recall for 03/2020.  Called and spoke to patient.  Patient stated that she received her second dose of covid vaccine on Sunday. On Monday she developed a productive cough with clear mucus and temp of 100.  She is concerned that she may be developing a COPD flare.  Denied sob, wheezing or additional symptoms. Using albuterol HFA TID and Breztri BID with some relief. Not currently taking any OTC medications.   Aaron Edelman, please advise. Thanks

## 2019-12-28 NOTE — Telephone Encounter (Signed)
Patient is aware of below recommendations and voiced her understanding.  Nothing further needed.   

## 2019-12-29 MED ORDER — AZITHROMYCIN 250 MG PO TABS: ORAL_TABLET | ORAL | 0 refills | Status: AC

## 2019-12-29 MED ORDER — PREDNISONE 10 MG (21) PO TBPK
ORAL_TABLET | ORAL | 0 refills | Status: DC
Start: 2019-12-29 — End: 2020-01-07

## 2019-12-29 NOTE — Telephone Encounter (Signed)
Pt states she is still having the same symptoms and believes it is a COPD flare up up based off of past experience. Please advise.

## 2019-12-29 NOTE — Telephone Encounter (Signed)
Patient feels that she is having a COPD flare. She is requesting abx.  Sx are baseline since yesterday(Please see below message).  Dr. Patsey Berthold, please advise.

## 2019-12-29 NOTE — Telephone Encounter (Signed)
Verified with Dr. Noreene Filbert 21 tablet pack.    Patient is aware recommendations and voiced her understanding.  Rx for Prednisone and zpak has been sent to preferred pharmacy.  Nothing further is needed.

## 2019-12-29 NOTE — Telephone Encounter (Signed)
Redness around 21 tablet pack, taper as directed in the pack. If not allergic, Azithromycin Z-Pak.

## 2020-01-06 ENCOUNTER — Emergency Department
Admission: EM | Admit: 2020-01-06 | Discharge: 2020-01-06 | Disposition: A | Discharge status: Home / Self Care | Payer: Medicare Other | Source: Home / Self Care

## 2020-01-06 ENCOUNTER — Encounter: Payer: Self-pay | Admitting: Intensive Care

## 2020-01-06 ENCOUNTER — Telehealth: Payer: Self-pay | Admitting: Pulmonary Disease

## 2020-01-06 ENCOUNTER — Emergency Department: Payer: Medicare Other

## 2020-01-06 ENCOUNTER — Other Ambulatory Visit: Payer: Self-pay

## 2020-01-06 DIAGNOSIS — J449 Chronic obstructive pulmonary disease, unspecified: Secondary | ICD-10-CM | POA: Insufficient documentation

## 2020-01-06 DIAGNOSIS — J439 Emphysema, unspecified: Secondary | ICD-10-CM | POA: Diagnosis not present

## 2020-01-06 DIAGNOSIS — Z20822 Contact with and (suspected) exposure to covid-19: Secondary | ICD-10-CM | POA: Diagnosis not present

## 2020-01-06 DIAGNOSIS — Z5321 Procedure and treatment not carried out due to patient leaving prior to being seen by health care provider: Secondary | ICD-10-CM | POA: Insufficient documentation

## 2020-01-06 DIAGNOSIS — F329 Major depressive disorder, single episode, unspecified: Secondary | ICD-10-CM | POA: Diagnosis not present

## 2020-01-06 DIAGNOSIS — J441 Chronic obstructive pulmonary disease with (acute) exacerbation: Secondary | ICD-10-CM | POA: Diagnosis not present

## 2020-01-06 DIAGNOSIS — R059 Cough, unspecified: Secondary | ICD-10-CM | POA: Insufficient documentation

## 2020-01-06 DIAGNOSIS — L930 Discoid lupus erythematosus: Secondary | ICD-10-CM | POA: Diagnosis not present

## 2020-01-06 DIAGNOSIS — R0602 Shortness of breath: Secondary | ICD-10-CM | POA: Diagnosis not present

## 2020-01-06 DIAGNOSIS — F419 Anxiety disorder, unspecified: Secondary | ICD-10-CM | POA: Diagnosis not present

## 2020-01-06 DIAGNOSIS — J9601 Acute respiratory failure with hypoxia: Secondary | ICD-10-CM | POA: Diagnosis not present

## 2020-01-06 LAB — CBC
HCT: 44.7 % (ref 36.0–46.0)
Hemoglobin: 14.6 g/dL (ref 12.0–15.0)
MCH: 26.4 pg (ref 26.0–34.0)
MCHC: 32.7 g/dL (ref 30.0–36.0)
MCV: 81 fL (ref 80.0–100.0)
Platelets: 276 10*3/uL (ref 150–400)
RBC: 5.52 MIL/uL — ABNORMAL HIGH (ref 3.87–5.11)
RDW: 15.2 % (ref 11.5–15.5)
WBC: 8.7 10*3/uL (ref 4.0–10.5)
nRBC: 0 % (ref 0.0–0.2)

## 2020-01-06 LAB — BASIC METABOLIC PANEL
Anion gap: 10 (ref 5–15)
BUN: 11 mg/dL (ref 6–20)
CO2: 30 mmol/L (ref 22–32)
Calcium: 9.3 mg/dL (ref 8.9–10.3)
Chloride: 97 mmol/L — ABNORMAL LOW (ref 98–111)
Creatinine, Ser: 0.86 mg/dL (ref 0.44–1.00)
GFR, Estimated: >60 mL/min (ref >60)
Glucose, Bld: 110 mg/dL — ABNORMAL HIGH (ref 70–99)
Potassium: 3.6 mmol/L (ref 3.5–5.1)
Sodium: 137 mmol/L (ref 135–145)

## 2020-01-06 LAB — TROPONIN I (HIGH SENSITIVITY): Troponin I (High Sensitivity): 3 ng/L (ref <18)

## 2020-01-06 NOTE — Telephone Encounter (Signed)
Patient has some improved with prednisone and zpak but sx did not subside.    Relayed below recommendations to patient. She voiced her understanding and had no further questions. She will go to ED for further eval.  Nothing further needed.

## 2020-01-06 NOTE — Telephone Encounter (Signed)
Just recently treated with antibiotics and prednisone.,  Did she improve?  If she did not she may need to go to the emergency room to be evaluated.

## 2020-01-06 NOTE — ED Notes (Signed)
Sent rainbow to lab. 

## 2020-01-06 NOTE — ED Triage Notes (Addendum)
Patient reports waking up this AM with cough and then progressed into sob. HX COPD and emphysema. No relief with nebulizer at home. Reports nebulizer made her chest sting all over.  Reports non productive cough. MD Patsey Berthold sent patient to ER

## 2020-01-06 NOTE — Telephone Encounter (Addendum)
Spoke to patient, who stated that she woke up this morning with a cough and increased sob. Cough has progressed throughout the day.  She is also experiencing stinging sensation on the right side of her chest. Sensation worsens after using albuterol. Denied fever, chills or sweats.  She has had both covid vaccines.  Recently prescribed zpak and prednisone on 12/29/2019. Some improvement but sx did not subside.  Dr. Patsey Berthold, please advise.

## 2020-01-07 ENCOUNTER — Encounter: Payer: Self-pay | Admitting: Physician Assistant

## 2020-01-07 ENCOUNTER — Telehealth (INDEPENDENT_AMBULATORY_CARE_PROVIDER_SITE_OTHER): Payer: Medicare Other | Admitting: Physician Assistant

## 2020-01-07 DIAGNOSIS — J441 Chronic obstructive pulmonary disease with (acute) exacerbation: Secondary | ICD-10-CM | POA: Diagnosis not present

## 2020-01-07 MED ORDER — BENZONATATE 200 MG PO CAPS: 200.0000 mg | ORAL_CAPSULE | Freq: Three times a day (TID) | ORAL | 0 refills | Status: DC | PRN

## 2020-01-07 MED ORDER — PULSE OXIMETER DELUXE MISC: 0 refills | Status: AC

## 2020-01-07 MED ORDER — AZITHROMYCIN 250 MG PO TABS: ORAL_TABLET | ORAL | 0 refills | Status: DC

## 2020-01-07 MED ORDER — PREDNISONE 10 MG PO TABS: ORAL_TABLET | ORAL | 0 refills | Status: DC

## 2020-01-07 NOTE — Progress Notes (Signed)
MyChart Video Visit    Virtual Visit via Video Note   This visit type was conducted due to national recommendations for restrictions regarding the COVID-19 Pandemic (e.g. social distancing) in an effort to limit this patient's exposure and mitigate transmission in our community. This patient is at least at moderate risk for complications without adequate follow up. This format is felt to be most appropriate for this patient at this time. Physical exam was limited by quality of the video and audio technology used for the visit.   Patient location: Home Provider location: [facility]  I discussed the limitations of evaluation and management by telemedicine and the availability of in person appointments. The patient expressed understanding and agreed to proceed.  Patient: Andrea [name]   DOB: 1965/12/31   54 y.o. Female  MRN: [id number] Visit Date: 01/07/2020  Today's healthcare provider: Mar Daring, PA-C   Chief Complaint  Patient presents with  . Cough   Subjective    HPI  Patient seen today via Mychart for cough. Has been ongoing since 12/28/2019. Called her pulmonologist, Dr. Patsey Berthold. Was started on zpak and prednisone. Reports mild symptom improvement, but not completely resolved. Has Lupus which complicates and slows healing. Reports almost always takes double treatment.   CXR from ER 01/06/20 had no acute issue noted. Labs from ER on 01/06/20 reviewed and normal.    Patient Active Problem List   Diagnosis Date Noted  . Personal history of colonic polyps   . Polyp of transverse colon   . Varicose veins of bilateral lower extremities with pain 11/22/2016  . Restless leg 11/18/2016  . Iron deficiency 11/18/2016  . Allergic rhinitis 06/11/2016  . Hyperlipidemia 03/19/2015  . COPD exacerbation ([place]) 10/04/2014  . Anxiety 09/22/2014  . Depression 09/22/2014  . Dyshidrosis 09/22/2014  . GERD (gastroesophageal reflux disease) 09/22/2014  . Insomnia 09/22/2014  . Lung  nodule, multiple 09/22/2014  . Panic disorder 09/22/2014  . Palpitations 01/17/2014  . Pulmonary nodule 06/10/2013  . Shortness of breath 05/20/2013  . COPD, GOLD B 05/20/2013  . Tobacco abuse 05/20/2013  . Daytime somnolence 05/20/2013  . Back pain 08/28/2012  . Discoid lupus 05/30/2012  . Generalized abdominal pain 03/11/2012  . Pain in joint 03/09/2012  . Psoriasis 02/21/2012  . Fibromyalgia 02/11/2012  . Lupus ([place]) 02/11/2012  . Panic attacks 02/11/2012  . History of adenomatous polyp of colon 09/13/2011  . Heartburn 08/06/2011  . PVC's (premature ventricular contractions) 01/29/2011  . Essential (primary) hypertension 03/04/1998   Past Medical History:  Diagnosis Date  . Anxiety    panic attacks, chest pain  . Arthritis   . COPD (chronic obstructive pulmonary disease) ([place])   . GERD (gastroesophageal reflux disease)   . Hypertension    not medicated  . Non-obstructive CAD in native artery    a. cardiac cath 01/2014: showed minor irregularities, mildly elevated left ventricular end-diastolic pressure and normal ejection fraction; b. 03/2015 MV: low risk.  . Palpitations    a. 08/2016 Event Monitor: occas PVC's, two brief runs of SVT (4 and 7 beats).  Marland Kitchen PONV (postoperative nausea and vomiting)   . Symptomatic PVC's (premature ventricular contractions)   . Tobacco abuse   . Wears dentures    partial upper and lower      Medications: Outpatient Medications Prior to Visit  Medication Sig  . albuterol (VENTOLIN HFA) 108 (90 Base) MCG/ACT inhaler Inhale 2 puffs into the lungs every 6 (six) hours as needed for wheezing or  shortness of breath.  . ALPRAZolam (XANAX) 1 MG tablet TAKE 1 TABLET BY MOUTH EVERY 8 HOURS AS NEEDED  . ARIPiprazole (ABILIFY) 5 MG tablet Take 5 mg by mouth daily as needed (anxiety and depression).   . Budeson-Glycopyrrol-Formoterol (BREZTRI AEROSPHERE) 160-9-4.8 MCG/ACT AERO Inhale 2 puffs into the lungs in the morning and at bedtime.  . clonazePAM  (KLONOPIN) 1 MG tablet TAKE 1 TABLET (1 MG TOTAL) BY MOUTH 2 (TWO) TIMES DAILY AS NEEDED.  Marland Kitchen dexlansoprazole (DEXILANT) 60 MG capsule Take 1 capsule (60 mg total) by mouth daily. **PLEASE SCHEDULE 1 YEAR FOLLOW UP APPT**  . DUREZOL 0.05 % EMUL Place 1 drop into the left eye 5 (five) times daily.  . hydroxychloroquine (PLAQUENIL) 200 MG tablet TAKE 2 TABLETS (400 MG TOTAL) BY MOUTH DAILY. FOR DISCOID LUPUS (L93.0)  . nitroGLYCERIN (NITROSTAT) 0.4 MG SL tablet Place 1 tablet (0.4 mg total) under the tongue every 5 (five) minutes as needed for chest pain. (Patient not taking: Reported on 12/03/2019)  . predniSONE (STERAPRED UNI-PAK 21 TAB) 10 MG (21) TBPK tablet Use as directed.  . propranolol (INDERAL) 20 MG tablet Take 1 tablet (20 mg total) by mouth 2 (two) times daily. (BETA BLOCKER) (Patient taking differently: Take 20 mg by mouth daily. Taking 1 tablet daily (BETA BLOCKER))  . Respiratory Therapy Supplies (FLUTTER) DEVI 1 Device by Does not apply route daily.   No facility-administered medications prior to visit.    Review of Systems  Constitutional: Negative.   HENT: Positive for congestion and sinus pressure. Negative for ear pain, postnasal drip and sore throat.   Respiratory: Positive for cough, chest tightness and shortness of breath.   Cardiovascular: Negative.   Neurological: Negative for dizziness and headaches.    Last CBC Lab Results  Component Value Date   WBC 8.7 01/06/2020   HGB 14.6 01/06/2020   HCT 44.7 01/06/2020   MCV 81.0 01/06/2020   MCH 26.4 01/06/2020   RDW 15.2 01/06/2020   PLT 276 44/31/5400   Last metabolic panel Lab Results  Component Value Date   GLUCOSE 110 (H) 01/06/2020   NA 137 01/06/2020   K 3.6 01/06/2020   CL 97 (L) 01/06/2020   CO2 30 01/06/2020   BUN 11 01/06/2020   CREATININE 0.86 01/06/2020   GFRNONAA >60 01/06/2020   GFRAA 104 06/08/2019   CALCIUM 9.3 01/06/2020   PROT 7.1 06/08/2019   ALBUMIN 4.3 06/08/2019   LABGLOB 2.8  06/08/2019   AGRATIO 1.5 06/08/2019   BILITOT 0.3 06/08/2019   ALKPHOS 110 06/08/2019   AST 13 06/08/2019   ALT 13 06/08/2019   ANIONGAP 10 01/06/2020      Objective    There were no vitals taken for this visit. BP Readings from Last 3 Encounters:  01/06/20 (!) 147/70  12/03/19 128/86  11/17/19 (!) 147/89   Wt Readings from Last 3 Encounters:  01/06/20 160 lb (72.6 kg)  12/03/19 156 lb 3.2 oz (70.9 kg)  11/17/19 156 lb 6.4 oz (70.9 kg)      Physical Exam Vitals reviewed.  Constitutional:      General: She is not in acute distress.    Appearance: Normal appearance. She is well-developed.  HENT:     Head: Normocephalic and atraumatic.  Pulmonary:     Effort: Accessory muscle usage (could see her using shoulders with breathing; able to speak in full sentences) present. No respiratory distress.  Musculoskeletal:     Cervical back: Normal range of motion and neck  supple.  Neurological:     Mental Status: She is alert.  Psychiatric:        Behavior: Behavior normal.        Thought Content: Thought content normal.        Judgment: Judgment normal.        Assessment & Plan     1. Chronic obstructive pulmonary disease with acute exacerbation (HCC) Worsening. Will treat with zpak, prednisone and tessalon perles. Push fluids. Rest. Continue inhalers and nebulizer as directed. Pulse ox ordered for patient to monitor at home. Advised if consistently below 90 she needs to report to the ER. She voices understanding. Call if worsening.  - azithromycin (ZITHROMAX) 250 MG tablet; Take 2 tablets PO on day one, and one tablet PO daily thereafter until completed.  Dispense: 6 tablet; Refill: 0 - predniSONE (DELTASONE) 10 MG tablet; Take 6 tablets PO on day 1 and day 2, take 5 tablets PO on day 3 and day 4, take 4 tablets PO on day 5 and day 6, take 3 tablets PO on day 7 and day 8, take 2 tablets PO on day 9 and day 10, take one tablet PO on day 11 and day 12.  Dispense: 42 tablet;  Refill: 0 - benzonatate (TESSALON) 200 MG capsule; Take 1 capsule (200 mg total) by mouth 3 (three) times daily as needed.  Dispense: 30 capsule; Refill: 0 - Misc. Devices (PULSE OXIMETER DELUXE) MISC; Use daily to monitor oxygen levels  Dispense: 1 each; Refill: 0   No follow-ups on file.     I discussed the assessment and treatment plan with the patient. The patient was provided an opportunity to ask questions and all were answered. The patient agreed with the plan and demonstrated an understanding of the instructions.   The patient was advised to call back or seek an in-person evaluation if the symptoms worsen or if the condition fails to improve as anticipated.  I provided 14 minutes of face-to-face time during this encounter via MyChart Video enabled encounter.  Reynolds Bowl, PA-C, have reviewed all documentation for this visit. The documentation on 01/07/20 for the exam, diagnosis, procedures, and orders are all accurate and complete.  [facility] [phone] (phone) [phone] (fax)  [place]

## 2020-01-07 NOTE — Patient Instructions (Signed)
Chronic Obstructive Pulmonary Disease Chronic obstructive pulmonary disease (COPD) is a long-term (chronic) lung problem. When you have COPD, it is hard for air to get in and out of your lungs. Usually the condition gets worse over time, and your lungs will never return to normal. There are things you can do to keep yourself as healthy as possible.  Your doctor may treat your condition with: ? Medicines. ? Oxygen. ? Lung surgery.  Your doctor may also recommend: ? Rehabilitation. This includes steps to make your body work better. It may involve a team of specialists. ? Quitting smoking, if you smoke. ? Exercise and changes to your diet. ? Comfort measures (palliative care). Follow these instructions at home: Medicines  Take over-the-counter and prescription medicines only as told by your doctor.  Talk to your doctor before taking any cough or allergy medicines. You may need to avoid medicines that cause your lungs to be dry. Lifestyle  If you smoke, stop. Smoking makes the problem worse. If you need help quitting, ask your doctor.  Avoid being around things that make your breathing worse. This may include smoke, chemicals, and fumes.  Stay active, but remember to rest as well.  Learn and use tips on how to relax.  Make sure you get enough sleep. Most adults need at least 7 hours of sleep every night.  Eat healthy foods. Eat smaller meals more often. Rest before meals. Controlled breathing Learn and use tips on how to control your breathing as told by your doctor. Try:  Breathing in (inhaling) through your nose for 1 second. Then, pucker your lips and breath out (exhale) through your lips for 2 seconds.  Putting one hand on your belly (abdomen). Breathe in slowly through your nose for 1 second. Your hand on your belly should move out. Pucker your lips and breathe out slowly through your lips. Your hand on your belly should move in as you breathe out.  Controlled coughing Learn  and use controlled coughing to clear mucus from your lungs. Follow these steps: 1. Lean your head a little forward. 2. Breathe in deeply. 3. Try to hold your breath for 3 seconds. 4. Keep your mouth slightly open while coughing 2 times. 5. Spit any mucus out into a tissue. 6. Rest and do the steps again 1 or 2 times as needed. General instructions  Make sure you get all the shots (vaccines) that your doctor recommends. Ask your doctor about a flu shot and a pneumonia shot.  Use oxygen therapy and pulmonary rehabilitation if told by your doctor. If you need home oxygen therapy, ask your doctor if you should buy a tool to measure your oxygen level (oximeter).  Make a COPD action plan with your doctor. This helps you to know what to do if you feel worse than usual.  Manage any other conditions you have as told by your doctor.  Avoid going outside when it is very hot, cold, or humid.  Avoid people who have a sickness you can catch (contagious).  Keep all follow-up visits as told by your doctor. This is important. Contact a doctor if:  You cough up more mucus than usual.  There is a change in the color or thickness of the mucus.  It is harder to breathe than usual.  Your breathing is faster than usual.  You have trouble sleeping.  You need to use your medicines more often than usual.  You have trouble doing your normal activities such as getting dressed   or walking around the house. Get help right away if:  You have shortness of breath while resting.  You have shortness of breath that stops you from: ? Being able to talk. ? Doing normal activities.  Your chest hurts for longer than 5 minutes.  Your skin color is more blue than usual.  Your pulse oximeter shows that you have low oxygen for longer than 5 minutes.  You have a fever.  You feel too tired to breathe normally. Summary  Chronic obstructive pulmonary disease (COPD) is a long-term lung problem.  The way your  lungs work will never return to normal. Usually the condition gets worse over time. There are things you can do to keep yourself as healthy as possible.  Take over-the-counter and prescription medicines only as told by your doctor.  If you smoke, stop. Smoking makes the problem worse. This information is not intended to replace advice given to you by your health care provider. Make sure you discuss any questions you have with your health care provider. Document Revised: 01/31/2017 Document Reviewed: 03/25/2016 Elsevier Patient Education  2020 [employer] Inc[employer]  

## 2020-01-08 ENCOUNTER — Other Ambulatory Visit: Payer: Self-pay

## 2020-01-08 ENCOUNTER — Emergency Department: Payer: Medicare Other

## 2020-01-08 ENCOUNTER — Encounter: Payer: Self-pay | Admitting: Emergency Medicine

## 2020-01-08 ENCOUNTER — Inpatient Hospital Stay
Admission: EM | Admit: 2020-01-08 | Discharge: 2020-01-10 | DRG: 190 | Disposition: A | Discharge status: Home Health | Payer: Medicare Other | Attending: Internal Medicine | Admitting: Internal Medicine

## 2020-01-08 DIAGNOSIS — Z7952 Long term (current) use of systemic steroids: Secondary | ICD-10-CM

## 2020-01-08 DIAGNOSIS — J441 Chronic obstructive pulmonary disease with (acute) exacerbation: Secondary | ICD-10-CM | POA: Diagnosis not present

## 2020-01-08 DIAGNOSIS — Z9049 Acquired absence of other specified parts of digestive tract: Secondary | ICD-10-CM

## 2020-01-08 DIAGNOSIS — G2581 Restless legs syndrome: Secondary | ICD-10-CM | POA: Diagnosis present

## 2020-01-08 DIAGNOSIS — Z91041 Radiographic dye allergy status: Secondary | ICD-10-CM

## 2020-01-08 DIAGNOSIS — Z886 Allergy status to analgesic agent status: Secondary | ICD-10-CM

## 2020-01-08 DIAGNOSIS — Z881 Allergy status to other antibiotic agents status: Secondary | ICD-10-CM

## 2020-01-08 DIAGNOSIS — M797 Fibromyalgia: Secondary | ICD-10-CM | POA: Diagnosis present

## 2020-01-08 DIAGNOSIS — F32A Depression, unspecified: Secondary | ICD-10-CM | POA: Diagnosis present

## 2020-01-08 DIAGNOSIS — I2510 Atherosclerotic heart disease of native coronary artery without angina pectoris: Secondary | ICD-10-CM | POA: Diagnosis present

## 2020-01-08 DIAGNOSIS — R0602 Shortness of breath: Secondary | ICD-10-CM | POA: Diagnosis not present

## 2020-01-08 DIAGNOSIS — L930 Discoid lupus erythematosus: Secondary | ICD-10-CM | POA: Diagnosis present

## 2020-01-08 DIAGNOSIS — J439 Emphysema, unspecified: Secondary | ICD-10-CM | POA: Diagnosis not present

## 2020-01-08 DIAGNOSIS — J309 Allergic rhinitis, unspecified: Secondary | ICD-10-CM | POA: Diagnosis present

## 2020-01-08 DIAGNOSIS — J449 Chronic obstructive pulmonary disease, unspecified: Secondary | ICD-10-CM | POA: Diagnosis present

## 2020-01-08 DIAGNOSIS — E785 Hyperlipidemia, unspecified: Secondary | ICD-10-CM | POA: Diagnosis present

## 2020-01-08 DIAGNOSIS — I119 Hypertensive heart disease without heart failure: Secondary | ICD-10-CM | POA: Diagnosis present

## 2020-01-08 DIAGNOSIS — F17210 Nicotine dependence, cigarettes, uncomplicated: Secondary | ICD-10-CM | POA: Diagnosis present

## 2020-01-08 DIAGNOSIS — D72829 Elevated white blood cell count, unspecified: Secondary | ICD-10-CM | POA: Diagnosis present

## 2020-01-08 DIAGNOSIS — I10 Essential (primary) hypertension: Secondary | ICD-10-CM | POA: Diagnosis present

## 2020-01-08 DIAGNOSIS — K219 Gastro-esophageal reflux disease without esophagitis: Secondary | ICD-10-CM | POA: Diagnosis present

## 2020-01-08 DIAGNOSIS — G4700 Insomnia, unspecified: Secondary | ICD-10-CM | POA: Diagnosis present

## 2020-01-08 DIAGNOSIS — Z90710 Acquired absence of both cervix and uterus: Secondary | ICD-10-CM

## 2020-01-08 DIAGNOSIS — F419 Anxiety disorder, unspecified: Secondary | ICD-10-CM | POA: Diagnosis present

## 2020-01-08 DIAGNOSIS — F329 Major depressive disorder, single episode, unspecified: Secondary | ICD-10-CM | POA: Diagnosis present

## 2020-01-08 DIAGNOSIS — Z20822 Contact with and (suspected) exposure to covid-19: Secondary | ICD-10-CM | POA: Diagnosis present

## 2020-01-08 DIAGNOSIS — Z8249 Family history of ischemic heart disease and other diseases of the circulatory system: Secondary | ICD-10-CM

## 2020-01-08 DIAGNOSIS — Z7951 Long term (current) use of inhaled steroids: Secondary | ICD-10-CM

## 2020-01-08 DIAGNOSIS — Z888 Allergy status to other drugs, medicaments and biological substances status: Secondary | ICD-10-CM

## 2020-01-08 DIAGNOSIS — Z720 Tobacco use: Secondary | ICD-10-CM | POA: Diagnosis present

## 2020-01-08 DIAGNOSIS — Z716 Tobacco abuse counseling: Secondary | ICD-10-CM

## 2020-01-08 DIAGNOSIS — J9601 Acute respiratory failure with hypoxia: Secondary | ICD-10-CM | POA: Diagnosis present

## 2020-01-08 DIAGNOSIS — Z79899 Other long term (current) drug therapy: Secondary | ICD-10-CM

## 2020-01-08 LAB — CBC
HCT: 44.2 % (ref 36.0–46.0)
Hemoglobin: 14.6 g/dL (ref 12.0–15.0)
MCH: 26.4 pg (ref 26.0–34.0)
MCHC: 33 g/dL (ref 30.0–36.0)
MCV: 80.1 fL (ref 80.0–100.0)
Platelets: 254 10*3/uL (ref 150–400)
RBC: 5.52 MIL/uL — ABNORMAL HIGH (ref 3.87–5.11)
RDW: 15.1 % (ref 11.5–15.5)
WBC: 12.5 10*3/uL — ABNORMAL HIGH (ref 4.0–10.5)
nRBC: 0 % (ref 0.0–0.2)

## 2020-01-08 LAB — RESPIRATORY PANEL BY RT PCR (FLU A&B, COVID)
Influenza A by PCR: NEGATIVE
Influenza B by PCR: NEGATIVE
SARS Coronavirus 2 by RT PCR: NEGATIVE

## 2020-01-08 LAB — BASIC METABOLIC PANEL
Anion gap: 10 (ref 5–15)
BUN: 14 mg/dL (ref 6–20)
CO2: 26 mmol/L (ref 22–32)
Calcium: 9 mg/dL (ref 8.9–10.3)
Chloride: 98 mmol/L (ref 98–111)
Creatinine, Ser: 0.75 mg/dL (ref 0.44–1.00)
GFR, Estimated: >60 mL/min (ref >60)
Glucose, Bld: 92 mg/dL (ref 70–99)
Potassium: 3.9 mmol/L (ref 3.5–5.1)
Sodium: 134 mmol/L — ABNORMAL LOW (ref 135–145)

## 2020-01-08 LAB — TROPONIN I (HIGH SENSITIVITY)
Troponin I (High Sensitivity): 7 ng/L (ref <18)
Troponin I (High Sensitivity): 3 ng/L (ref <18)

## 2020-01-08 MED ORDER — HYDROXYCHLOROQUINE SULFATE 200 MG PO TABS
400.0000 mg | ORAL_TABLET | Freq: Every day | ORAL | Status: DC
  Filled 2020-01-08: qty 2

## 2020-01-08 MED ORDER — CLONAZEPAM 0.5 MG PO TABS: 1.0000 mg | ORAL_TABLET | Freq: Every evening | ORAL | Status: DC | PRN

## 2020-01-08 MED ORDER — ONDANSETRON HCL 4 MG PO TABS: 4.0000 mg | ORAL_TABLET | Freq: Four times a day (QID) | ORAL | Status: DC | PRN

## 2020-01-08 MED ORDER — HYDROXYCHLOROQUINE SULFATE 200 MG PO TABS: 400.0000 mg | ORAL_TABLET | Freq: Every day | ORAL | Status: DC

## 2020-01-08 MED ORDER — BENZONATATE 100 MG PO CAPS
200.0000 mg | ORAL_CAPSULE | ORAL | Status: DC | PRN
  Administered 2020-01-08: 200 mg via ORAL
  Filled 2020-01-08: qty 2

## 2020-01-08 MED ORDER — UMECLIDINIUM BROMIDE 62.5 MCG/INH IN AEPB
1.0000 | INHALATION_SPRAY | Freq: Every day | RESPIRATORY_TRACT | Status: DC
  Administered 2020-01-09 – 2020-01-10 (×2): 1 via RESPIRATORY_TRACT
  Filled 2020-01-08: qty 7

## 2020-01-08 MED ORDER — PREDNISONE 20 MG PO TABS: 40.0000 mg | ORAL_TABLET | Freq: Every day | ORAL | Status: DC

## 2020-01-08 MED ORDER — ACETAMINOPHEN 500 MG PO TABS
1000.0000 mg | ORAL_TABLET | Freq: Once | ORAL | Status: AC
  Administered 2020-01-08: 1000 mg via ORAL

## 2020-01-08 MED ORDER — ENOXAPARIN SODIUM 40 MG/0.4ML ~~LOC~~ SOLN
40.0000 mg | SUBCUTANEOUS | Status: DC
  Filled 2020-01-08: qty 0.4

## 2020-01-08 MED ORDER — PANTOPRAZOLE SODIUM 40 MG PO TBEC
40.0000 mg | DELAYED_RELEASE_TABLET | Freq: Every day | ORAL | Status: DC
  Administered 2020-01-08 – 2020-01-10 (×3): 40 mg via ORAL
  Filled 2020-01-08 (×2): qty 1

## 2020-01-08 MED ORDER — ALBUTEROL SULFATE HFA 108 (90 BASE) MCG/ACT IN AERS
2.0000 | INHALATION_SPRAY | Freq: Four times a day (QID) | RESPIRATORY_TRACT | Status: DC | PRN
  Filled 2020-01-08: qty 6.7

## 2020-01-08 MED ORDER — METHYLPREDNISOLONE SODIUM SUCC 125 MG IJ SOLR
80.0000 mg | INTRAMUSCULAR | Status: DC
  Administered 2020-01-09 – 2020-01-10 (×2): 80 mg via INTRAVENOUS
  Filled 2020-01-08 (×2): qty 2

## 2020-01-08 MED ORDER — CLONAZEPAM 0.5 MG PO TABS: 1.0000 mg | ORAL_TABLET | Freq: Two times a day (BID) | ORAL | Status: DC | PRN

## 2020-01-08 MED ORDER — PROPRANOLOL HCL 20 MG PO TABS
20.0000 mg | ORAL_TABLET | Freq: Every day | ORAL | Status: DC
  Administered 2020-01-08 – 2020-01-10 (×3): 20 mg via ORAL
  Filled 2020-01-08 (×3): qty 1

## 2020-01-08 MED ORDER — ALBUTEROL SULFATE (2.5 MG/3ML) 0.083% IN NEBU
10.0000 mg | INHALATION_SOLUTION | Freq: Once | RESPIRATORY_TRACT | Status: AC
  Administered 2020-01-08: 10 mg via RESPIRATORY_TRACT
  Filled 2020-01-08: qty 12

## 2020-01-08 MED ORDER — IPRATROPIUM-ALBUTEROL 0.5-2.5 (3) MG/3ML IN SOLN: 3.0000 mL | RESPIRATORY_TRACT | Status: DC | PRN

## 2020-01-08 MED ORDER — METHYLPREDNISOLONE SODIUM SUCC 125 MG IJ SOLR
125.0000 mg | Freq: Once | INTRAMUSCULAR | Status: AC
  Administered 2020-01-08: 125 mg via INTRAVENOUS
  Filled 2020-01-08: qty 2

## 2020-01-08 MED ORDER — ONDANSETRON HCL 4 MG/2ML IJ SOLN: 4.0000 mg | Freq: Four times a day (QID) | INTRAMUSCULAR | Status: DC | PRN

## 2020-01-08 MED ORDER — ACETAMINOPHEN 325 MG PO TABS: 650.0000 mg | ORAL_TABLET | Freq: Four times a day (QID) | ORAL | Status: DC | PRN

## 2020-01-08 MED ORDER — SODIUM CHLORIDE 0.9 % IV SOLN
500.0000 mg | INTRAVENOUS | Status: DC
  Administered 2020-01-08: 500 mg via INTRAVENOUS
  Filled 2020-01-08 (×2): qty 500

## 2020-01-08 MED ORDER — MOMETASONE FURO-FORMOTEROL FUM 100-5 MCG/ACT IN AERO
2.0000 | INHALATION_SPRAY | Freq: Two times a day (BID) | RESPIRATORY_TRACT | Status: DC
  Administered 2020-01-08 – 2020-01-10 (×4): 2 via RESPIRATORY_TRACT
  Filled 2020-01-08: qty 8.8

## 2020-01-08 MED ORDER — ARIPIPRAZOLE 5 MG PO TABS
5.0000 mg | ORAL_TABLET | Freq: Every day | ORAL | Status: DC | PRN
  Filled 2020-01-08: qty 1

## 2020-01-08 MED ORDER — ACETAMINOPHEN 650 MG RE SUPP: 650.0000 mg | Freq: Four times a day (QID) | RECTAL | Status: DC | PRN

## 2020-01-08 MED ORDER — IPRATROPIUM BROMIDE 0.02 % IN SOLN
0.5000 mg | Freq: Once | RESPIRATORY_TRACT | Status: AC
  Administered 2020-01-08: 0.5 mg via RESPIRATORY_TRACT
  Filled 2020-01-08: qty 2.5

## 2020-01-08 MED ORDER — ALPRAZOLAM 0.5 MG PO TABS
1.0000 mg | ORAL_TABLET | Freq: Three times a day (TID) | ORAL | Status: DC
  Filled 2020-01-08: qty 2

## 2020-01-08 MED ORDER — BUDESON-GLYCOPYRROL-FORMOTEROL 160-9-4.8 MCG/ACT IN AERO: 2.0000 | INHALATION_SPRAY | Freq: Two times a day (BID) | RESPIRATORY_TRACT | Status: DC

## 2020-01-08 MED ORDER — ACETAMINOPHEN 500 MG PO TABS
ORAL_TABLET | ORAL | Status: AC
  Filled 2020-01-08: qty 2

## 2020-01-08 MED ORDER — IPRATROPIUM-ALBUTEROL 0.5-2.5 (3) MG/3ML IN SOLN
3.0000 mL | Freq: Three times a day (TID) | RESPIRATORY_TRACT | Status: AC
  Administered 2020-01-08 – 2020-01-10 (×6): 3 mL via RESPIRATORY_TRACT
  Filled 2020-01-08 (×5): qty 3

## 2020-01-08 MED ORDER — POLYETHYLENE GLYCOL 3350 17 G PO PACK: 17.0000 g | PACK | Freq: Every day | ORAL | Status: DC | PRN

## 2020-01-08 NOTE — ED Notes (Signed)
RN attempted to call report x1 

## 2020-01-08 NOTE — ED Notes (Signed)
Pt ambulatory to restroom with portable o2 tank. States she does not wear oxygen at home but was placed on oxygen in triage.

## 2020-01-08 NOTE — ED Provider Notes (Signed)
[facility] Emergency Department Provider Note ____________________________________________   First MD Initiated Contact with Patient 01/08/20 1519     (approximate)  I have reviewed the triage vital signs and the nursing notes.  HISTORY  Chief Complaint Shortness of Breath and Cough   HPI Andrea [name] is a 54 y.o. femalewho presents to the ED for evaluation of shortness of breath and cough.  Chart review indicates history of nonobstructive CAD, COPD, GERD and anxiety.  Continued cigarette smoking.  Follows with Dr. Patsey Berthold with pulmonology.  Patient reports that she has been on prednisone and a Z-Pak for the past 3 days, but despite this has been progressively worsening over the past 2 days with increased shortness of breath and nonproductive cough.  She reports associated chest tightness sensation when she is having a coughing fit, but denies syncope, fever, increased bleeding production, abdominal pain, emesis, dysuria.  She last took her prednisone yesterday evening.  She reports using her home albuterol and controller inhaler without significant provement of her symptoms.  She does not wear home oxygen, but required 2 L nasal cannula in triage.  She is fully vaccinated for COVID-19 and has had no sick contacts.  Past Medical History:  Diagnosis Date  . Anxiety    panic attacks, chest pain  . Arthritis   . COPD (chronic obstructive pulmonary disease) ([place])   . GERD (gastroesophageal reflux disease)   . Hypertension    not medicated  . Non-obstructive CAD in native artery    a. cardiac cath 01/2014: showed minor irregularities, mildly elevated left ventricular end-diastolic pressure and normal ejection fraction; b. 03/2015 MV: low risk.  . Palpitations    a. 08/2016 Event Monitor: occas PVC's, two brief runs of SVT (4 and 7 beats).  Marland Kitchen PONV (postoperative nausea and vomiting)   . Symptomatic PVC's (premature ventricular contractions)   . Tobacco  abuse   . Wears dentures    partial upper and lower    Patient Active Problem List   Diagnosis Date Noted  . Personal history of colonic polyps   . Polyp of transverse colon   . Varicose veins of bilateral lower extremities with pain 11/22/2016  . Restless leg 11/18/2016  . Iron deficiency 11/18/2016  . Allergic rhinitis 06/11/2016  . Hyperlipidemia 03/19/2015  . COPD exacerbation ([place]) 10/04/2014  . Anxiety 09/22/2014  . Depression 09/22/2014  . Dyshidrosis 09/22/2014  . GERD (gastroesophageal reflux disease) 09/22/2014  . Insomnia 09/22/2014  . Lung nodule, multiple 09/22/2014  . Panic disorder 09/22/2014  . Palpitations 01/17/2014  . Pulmonary nodule 06/10/2013  . Shortness of breath 05/20/2013  . COPD, GOLD B 05/20/2013  . Tobacco abuse 05/20/2013  . Daytime somnolence 05/20/2013  . Back pain 08/28/2012  . Discoid lupus 05/30/2012  . Generalized abdominal pain 03/11/2012  . Pain in joint 03/09/2012  . Psoriasis 02/21/2012  . Fibromyalgia 02/11/2012  . Lupus ([place]) 02/11/2012  . Panic attacks 02/11/2012  . History of adenomatous polyp of colon 09/13/2011  . Heartburn 08/06/2011  . PVC's (premature ventricular contractions) 01/29/2011  . Essential (primary) hypertension 03/04/1998    Past Surgical History:  Procedure Laterality Date  . CARDIAC CATHETERIZATION  01/03/14  . CARDIAC CATHETERIZATION    . CHOLECYSTECTOMY    . COLONOSCOPY WITH PROPOFOL N/A 10/05/2019   Procedure: COLONOSCOPY WITH PROPOFOL;  Surgeon: Lucilla Lame, MD;  Location: [facility] ENDOSCOPY;  Service: Endoscopy;  Laterality: N/A;  . ESOPHAGOGASTRODUODENOSCOPY (EGD) WITH PROPOFOL N/A 07/11/2016   Procedure: ESOPHAGOGASTRODUODENOSCOPY (EGD)  WITH PROPOFOL;  Surgeon: Lucilla Lame, MD;  Location: [place];  Service: Endoscopy;  Laterality: N/A;  . ESOPHAGOGASTRODUODENOSCOPY (EGD) WITH PROPOFOL N/A 10/05/2019   Procedure: ESOPHAGOGASTRODUODENOSCOPY (EGD) WITH PROPOFOL;  Surgeon: Lucilla Lame, MD;   Location: [facility] ENDOSCOPY;  Service: Endoscopy;  Laterality: N/A;  . EYE SURGERY    . IMAGE GUIDED SINUS SURGERY N/A 11/30/2014   Procedure: IMAGE GUIDED SINUS SURGERY;  Surgeon: Carloyn Manner, MD;  Location: [place];  Service: ENT;  Laterality: N/A;  GAVE DISK TO CE CE  . MAXILLARY ANTROSTOMY Bilateral 11/30/2014   Procedure: MAXILLARY ANTROSTOMY;  Surgeon: Carloyn Manner, MD;  Location: [place];  Service: ENT;  Laterality: Bilateral;  . SEPTOPLASTY N/A 11/30/2014   Procedure: SEPTOPLASTY;  Surgeon: Carloyn Manner, MD;  Location: [place];  Service: ENT;  Laterality: N/A;  . SPHENOIDECTOMY Left 11/30/2014   Procedure: Coralee Pesa, ;  Surgeon: Carloyn Manner, MD;  Location: [place];  Service: ENT;  Laterality: Left;  Marland Kitchen VAGINAL HYSTERECTOMY  1995   vaginal    Prior to Admission medications   Medication Sig Start Date End Date Taking? Authorizing Provider  albuterol (VENTOLIN HFA) 108 (90 Base) MCG/ACT inhaler Inhale 2 puffs into the lungs every 6 (six) hours as needed for wheezing or shortness of breath. 05/13/19   Tyler Pita, MD  ALPRAZolam Duanne Moron) 1 MG tablet TAKE 1 TABLET BY MOUTH EVERY 8 HOURS AS NEEDED 05/21/18   Birdie Sons, MD  ARIPiprazole (ABILIFY) 5 MG tablet Take 5 mg by mouth daily as needed (anxiety and depression).     [provider]  azithromycin (ZITHROMAX) 250 MG tablet Take 2 tablets PO on day one, and one tablet PO daily thereafter until completed. 01/07/20   Mar Daring, PA-C  benzonatate (TESSALON) 200 MG capsule Take 1 capsule (200 mg total) by mouth 3 (three) times daily as needed. 01/07/20   Mar Daring, PA-C  Budeson-Glycopyrrol-Formoterol (BREZTRI AEROSPHERE) 160-9-4.8 MCG/ACT AERO Inhale 2 puffs into the lungs in the morning and at bedtime. 05/13/19   Tyler Pita, MD  clonazePAM (KLONOPIN) 1 MG tablet TAKE 1 TABLET (1 MG TOTAL) BY MOUTH 2 (TWO) TIMES DAILY AS NEEDED.  11/24/19   Birdie Sons, MD  dexlansoprazole (DEXILANT) 60 MG capsule Take 1 capsule (60 mg total) by mouth daily. **PLEASE SCHEDULE 1 YEAR FOLLOW UP APPT** 08/18/19   Lucilla Lame, MD  DUREZOL 0.05 % EMUL Place 1 drop into the left eye 5 (five) times daily. 11/09/19   [provider]  hydroxychloroquine (PLAQUENIL) 200 MG tablet TAKE 2 TABLETS (400 MG TOTAL) BY MOUTH DAILY. FOR DISCOID LUPUS (L93.0) 09/29/19   Birdie Sons, MD  Misc. Devices (PULSE OXIMETER DELUXE) MISC Use daily to monitor oxygen levels 01/07/20   Fenton Malling M, PA-C  nitroGLYCERIN (NITROSTAT) 0.4 MG SL tablet Place 1 tablet (0.4 mg total) under the tongue every 5 (five) minutes as needed for chest pain. Patient not taking: Reported on 12/03/2019 12/08/14   Wellington Hampshire, MD  predniSONE (DELTASONE) 10 MG tablet Take 6 tablets PO on day 1 and day 2, take 5 tablets PO on day 3 and day 4, take 4 tablets PO on day 5 and day 6, take 3 tablets PO on day 7 and day 8, take 2 tablets PO on day 9 and day 10, take one tablet PO on day 11 and day 12. 01/07/20   Mar Daring, PA-C  propranolol (INDERAL) 20 MG tablet  Take 1 tablet (20 mg total) by mouth 2 (two) times daily. (BETA BLOCKER) Patient taking differently: Take 20 mg by mouth daily. Taking 1 tablet daily (BETA BLOCKER) 07/29/18   Fisher, Kirstie Peri, MD  Respiratory Therapy Supplies (FLUTTER) DEVI 1 Device by Does not apply route daily. 03/19/15   Theodoro Grist, MD    Allergies Alum & mag hydroxide-simeth, Aspirin, Cefdinir, Chantix [varenicline], Doxycycline, Multaq [dronedarone], and Ivp dye [iodinated diagnostic agents]  Family History  Problem Relation Age of Onset  . Emphysema Father   . Asthma Father   . Heart disease Father   . Heart attack Father   . Arthritis Father        RA  . Colon cancer Father   . Hypertension Mother   . Breast cancer Neg Hx     Social History Social History   Tobacco Use  . Smoking status: Current Every Day Smoker      Packs/day: 0.50    Years: 16.00    Pack years: 8.00    Types: Cigarettes  . Smokeless tobacco: Never Used  . Tobacco comment: 0.5 pack daily-05/13/2019  Vaping Use  . Vaping Use: Never used  Substance Use Topics  . Alcohol use: No    Alcohol/week: 0.0 standard drinks  . Drug use: No    Review of Systems  Constitutional: No fever/chills Eyes: No visual changes. ENT: No sore throat. Cardiovascular: Denies chest pain. Respiratory: Positive shortness of breath and nonproductive cough. Gastrointestinal: No abdominal pain.  No nausea, no vomiting.  No diarrhea.  No constipation. Genitourinary: Negative for dysuria. Musculoskeletal: Negative for back pain. Skin: Negative for rash. Neurological: Negative for headaches, focal weakness or numbness.  ____________________________________________   PHYSICAL EXAM:  VITAL SIGNS: Vitals:   01/08/20 1600 01/08/20 1700  BP:    Pulse: (!) 117 (!) 126  Resp:    Temp:    SpO2: 95% 95%      Constitutional: Alert and oriented.  Sitting up in bed, uncomfortable appearing.  Speaking in phrases only. Eyes: Conjunctivae are normal. PERRL. EOMI. Head: Atraumatic. Nose: No congestion/rhinnorhea. Mouth/Throat: Mucous membranes are moist.  Oropharynx non-erythematous. Neck: No stridor. No cervical spine tenderness to palpation. Cardiovascular: Tachycardic rate, regular rhythm. Grossly normal heart sounds.  Good peripheral circulation. Respiratory: No retractions.  Tachypneic to the upper 20s.  Poor air movement throughout with diffuse expiratory wheezes. Gastrointestinal: Soft , nondistended, nontender to palpation. No CVA tenderness. Musculoskeletal: No lower extremity tenderness nor edema.  No joint effusions. No signs of acute trauma. Neurologic:  Normal speech and language. No gross focal neurologic deficits are appreciated. No gait instability noted. Skin:  Skin is warm, dry and intact. No rash noted. Psychiatric: Mood and affect are  normal. Speech and behavior are normal.  ____________________________________________   LABS (all labs ordered are listed, but only abnormal results are displayed)  Labs Reviewed  BASIC METABOLIC PANEL - Abnormal; Notable for the following components:      Result Value   Sodium 134 (*)    All other components within normal limits  CBC - Abnormal; Notable for the following components:   WBC 12.5 (*)    RBC 5.52 (*)    All other components within normal limits  RESPIRATORY PANEL BY RT PCR (FLU A&B, COVID)  TROPONIN I (HIGH SENSITIVITY)  TROPONIN I (HIGH SENSITIVITY)   ____________________________________________  12 Lead EKG  Sinus rhythm, rate of 115 bpm.  Normal axis and intervals.  No evidence of acute ischemia.  Sinus tachycardia.  ____________________________________________  RADIOLOGY  ED MD interpretation: 2 view CXR reviewed by me with hyperinflation, without evidence of acute cardiopulmonary pathology.  Official radiology report(s): DG Chest 2 View  Result Date: 01/08/2020 CLINICAL DATA:  54 year old female with history of shortness of breath. EXAM: CHEST - 2 VIEW COMPARISON:  Chest x-ray 01/06/2020. FINDINGS: Lung volumes are increased with emphysematous changes. No consolidative airspace disease. No pleural effusions. No pneumothorax. No pulmonary nodule or mass noted. Pulmonary vasculature and the cardiomediastinal silhouette are within normal limits. Atherosclerosis in the thoracic aorta. IMPRESSION: 1. No radiographic evidence of acute cardiopulmonary disease. 2. Emphysema. 3. Aortic atherosclerosis. Electronically Signed   By: Vinnie Langton M.D.   On: 01/08/2020 15:16    ____________________________________________   PROCEDURES and INTERVENTIONS  Procedure(s) performed (including Critical Care):  Procedures  Medications  albuterol (PROVENTIL) (2.5 MG/3ML) 0.083% nebulizer solution 10 mg (10 mg Nebulization Given 01/08/20 1615)  ipratropium (ATROVENT)  nebulizer solution 0.5 mg (0.5 mg Nebulization Given 01/08/20 1604)  methylPREDNISolone sodium succinate (SOLU-MEDROL) 125 mg/2 mL injection 125 mg (125 mg Intravenous Given 01/08/20 1605)  acetaminophen (TYLENOL) tablet 1,000 mg (1,000 mg Oral Given 01/08/20 1659)    ____________________________________________   MDM / ED COURSE   54 year old woman presents to the ED with shortness of breath and cough consistent with a COPD saturation despite appropriate outpatient treatment, with associated tachycardia and hypoxia, requiring medical admission.  Patient requiring nasal cannula and is tachycardic to the 110s, sitting up in bed and speaking only in short phrases.  We discussed BiPAP, but she is anxious and requests trial of medications prior to this, which I think is reasonable.  Provide her with IV steroids, hour-long albuterol neb with Atrovent with slight improvement of her symptoms.  She still tachycardic, still wheezing still requiring nasal cannula.  Especially in the setting of her already being on prednisone and azithromycin as an outpatient, I am concerned that she will very likely fail outpatient measures and so we will admit to hospitalist for further work-up and management of her exacerbation.  She has no infiltrates, fever or increased pedal production to suggest bacterial infection component to require antibiotics.  She still has significant wheezing and evidence of pulmonary pathology that is likely causing her continued tachycardia and hypoxia, while an acute PE is possible this is more likely to be her COPD in etiology.  I discussed this with admitting hospitalist.  Clinical Course as of Jan 07 1730  Sat Jan 08, 2020  1703 Reassessed.  Patient sitting up in the side of the bed.  Still tachycardic and still requiring nasal cannula.  She reports feeling little bit better.  Reexamination of her pulmonary exam reveals marginal improvement.  Will admit to hospitalist due to failure of  outpatient management and continued tachycardia/hypoxia.   [DS]    Clinical Course User Index [DS] Vladimir Crofts, MD    ____________________________________________   FINAL CLINICAL IMPRESSION(S) / ED DIAGNOSES  Final diagnoses:  COPD exacerbation ([place])  Shortness of breath     ED Discharge Orders    None       Tikia Skilton Tamala Julian   Note:  This document was prepared using Dragon voice recognition software and may include unintentional dictation errors.   Vladimir Crofts, MD 01/08/20 1734

## 2020-01-08 NOTE — ED Notes (Signed)
Pt placed on 2 liters O2 via [place]. Pt next for room

## 2020-01-08 NOTE — H&P (Signed)
History and Physical   [name] XWR:[id number] DOB: [birth year] DOA: 01/08/2020  PCP: Birdie Sons, MD  Outpatient Specialists: Dr. Vernard Gambles, [facility] Patient coming from: home  I have personally briefly reviewed patient's old medical records in [place].  Chief Concern: shortness of breath   HPI: Andrea [name] is a 54 y.o. female with medical history significant for stage II moderate copd, history of PVCs, chronic tobacco use, tobacco dependence, cough, previously not on home oxygen, hiatal hernia, anxiety with panic attacks, presents to the emergency department for chief concerns of shortness of breath.  Andrea [name] reports that the shortness of breath started 3 days ago and worsened today, prompting her to present to the emergency department for further evaluation.  Furthermore, she presented to her PCP the previous day and was prescribed outpatient medication without relief.  She reports that at baseline can walk from house to mailbox without difficulty. Today, she couldn't walk from her bedroom to the living w/o getting short of breath. This is similar to previous episodes of COPD exacerbation, however this is worst than previous presentation.   She endorses the shortness of breath is worse with coughing spell and with exertion, headaches with albuterol use that has now resolved.  She denies fever, nonproductive cough, chills, chest pain, abdominal pain, nausea, vomiting, dysuria, hematuria, diarrhea, ringing in ears, no swelling in legs, sick contacts, pets in the house.    Chart review: Patient presented to PCPs office and was treated outpatient for COPD with exacerbation.  She was given azithromycin pack, prednisone, Tessalon Perles, and prescribed a pulse oximeter device.  She endorses compliance with these medications however her symptoms did not improve and she presented to the emergency department for further evaluation.  Social history: lives at  home with husband and daughter, no pets. Endorsed tobacco use for over 20 years, 1/2 - 1 pack per day, denies etoh, recreational drug use  ED Course: Discussed with ED provider admit for COPD exacerbation, failing outpatient therapy  Review of Systems: As per HPI otherwise 10 point review of systems negative.  Assessment/Plan  Principal Problem:   COPD exacerbation (HCC) Active Problems:   Shortness of breath   Tobacco abuse   Anxiety   Depression   Discoid lupus   Essential (primary) hypertension   # Stage II moderate COPD by Gold classification-PFT test outpatient has been ordered by her pulmonologist -She uses BREZTRI (budeson-glyopyrrol-formoterol) inhaler at home and reports compliance, however this is not on formulary -Substituted with Ruthe Mannan and Incruse Ellipta per pharmacy recommendation -Continue regular follow-up with outpatient pulmonologist  # Shortness of breath-suspect secondary to COPD exacerbation in setting of persistent tobacco use -Low clinical suspicion for infectious etiology and patient did not have any viral or bacterial symptoms -Personally reviewed chest x-ray, low clinical and radiologic suspicion for superimposed pneumonia at this time -DuoNebs 3 times daily scheduled, including 1 tonight before bedtime -Azithromycin 500 mg IV for anti-inflammatory benefits -Status post Solu-Medrol 125 mg IV once in the ED -Ordered Solu-Medrol 80 mg every 24 hours starting 01/09/2020 for 3 more days, with transition to p.o. prednisone 40 mg daily -Transition of care manager has been consulted for DME in setting of COPD, oxygen supplementation available at home -I explained the process of DME approval, and that likely she will need to have documented need of oxygen on day of discharge, she states she understands  # Leukocytosis - suspect secondary to PO steroid use causing demyelination  # Depression/anxiety-resumed home  Xanax 1 mg 3 times daily, Abilify, and Klonopin as  needed # Discoid lupus-resumed hydroxychloroquine 400 mg daily # Chronic tobacco use, currently smokes half a pack to 1 pack/day, has been smoking for at least 20 years - Tobacco counseling cessation encounter - Patient endorses readiness to stop tobacco use. - Tobacco cessation counseling:  1. Week one, smoke 19 cigarettes per day. Week two, smoke 18 cigarettes per day. Week three, smoke 17 cigarettes per day, continue until smoking half the amount of cigarettes per day 2. Discuss with PCP for pharmacologic assistance with smoking cessation 3. Clean all indoor clothing, sheets, blankets, and freshen textile furniture to rid the smell of cigarettes 4. Only smoke outside and wear outer covering 5. Leave cigarettes and lighters outside in separate places 6. During in-between cigarettes, if you feel the urge to smoke, use the following: stress squeezing devices/phone a trusted friend to talk you through the urge/walk in a safe environment 7. Avoid prolong interactions with individuals actively smoking cigarettes 8. If you smoke in social setting, avoid social setting where cigarette smoking is expected or considered acceptable  9. If missing the feeling of holding a cigarette, cut a sipping straw to the length of a cigarette and hold it between your fingers 10. Call [facility] if in need of nicotine patches to help with cessation  DVT prophylaxis: Enoxaparin Code Status: Full code Diet: Heart healthy Family Communication: Discussed with spouse at bedside Disposition Plan: Pending clinical course Consults called: TCM Admission status: Observation with telemetry  Past Medical History:  Diagnosis Date  . Anxiety    panic attacks, chest pain  . Arthritis   . COPD (chronic obstructive pulmonary disease) ([place])   . GERD (gastroesophageal reflux disease)   . Hypertension    not medicated  . Non-obstructive CAD in native artery    a. cardiac cath 01/2014: showed minor irregularities,  mildly elevated left ventricular end-diastolic pressure and normal ejection fraction; b. 03/2015 MV: low risk.  . Palpitations    a. 08/2016 Event Monitor: occas PVC's, two brief runs of SVT (4 and 7 beats).  Marland Kitchen PONV (postoperative nausea and vomiting)   . Symptomatic PVC's (premature ventricular contractions)   . Tobacco abuse   . Wears dentures    partial upper and lower   Past Surgical History:  Procedure Laterality Date  . CARDIAC CATHETERIZATION  01/03/14  . CARDIAC CATHETERIZATION    . CHOLECYSTECTOMY    . COLONOSCOPY WITH PROPOFOL N/A 10/05/2019   Procedure: COLONOSCOPY WITH PROPOFOL;  Surgeon: Lucilla Lame, MD;  Location: [facility] ENDOSCOPY;  Service: Endoscopy;  Laterality: N/A;  . ESOPHAGOGASTRODUODENOSCOPY (EGD) WITH PROPOFOL N/A 07/11/2016   Procedure: ESOPHAGOGASTRODUODENOSCOPY (EGD) WITH PROPOFOL;  Surgeon: Lucilla Lame, MD;  Location: [facility];  Service: Endoscopy;  Laterality: N/A;  . ESOPHAGOGASTRODUODENOSCOPY (EGD) WITH PROPOFOL N/A 10/05/2019   Procedure: ESOPHAGOGASTRODUODENOSCOPY (EGD) WITH PROPOFOL;  Surgeon: Lucilla Lame, MD;  Location: [facility] ENDOSCOPY;  Service: Endoscopy;  Laterality: N/A;  . EYE SURGERY    . IMAGE GUIDED SINUS SURGERY N/A 11/30/2014   Procedure: IMAGE GUIDED SINUS SURGERY;  Surgeon: Carloyn Manner, MD;  Location: [facility];  Service: ENT;  Laterality: N/A;  GAVE DISK TO CE CE  . MAXILLARY ANTROSTOMY Bilateral 11/30/2014   Procedure: MAXILLARY ANTROSTOMY;  Surgeon: Carloyn Manner, MD;  Location: [facility];  Service: ENT;  Laterality: Bilateral;  . SEPTOPLASTY N/A 11/30/2014   Procedure: SEPTOPLASTY;  Surgeon: Carloyn Manner, MD;  Location: [facility];  Service: ENT;  Laterality: N/A;  . SPHENOIDECTOMY Left 11/30/2014   Procedure: Coralee Pesa, ;  Surgeon: Carloyn Manner, MD;  Location: [facility];  Service: ENT;  Laterality: Left;  Marland Kitchen VAGINAL HYSTERECTOMY  1995   vaginal   Social History:  reports that  she has been smoking cigarettes. She has a 8.00 pack-year smoking history. She has never used smokeless tobacco. She reports that she does not drink alcohol and does not use drugs.  Allergies  Allergen Reactions  . Alum & Mag Hydroxide-Simeth Diarrhea and Nausea Only  . Aspirin     Nosebleed   . Cefdinir Other (See Comments)    tachycardia  . Chantix [Varenicline]     Bad dreams  . Doxycycline Other (See Comments)    Nausea, migraine  . Multaq [Dronedarone] Other (See Comments)    Lip/ mouth numbness/ tongue swelling  . Ivp Dye [Iodinated Diagnostic Agents] Palpitations   Family History  Problem Relation Age of Onset  . Emphysema Father   . Asthma Father   . Heart disease Father   . Heart attack Father   . Arthritis Father        RA  . Colon cancer Father   . Hypertension Mother   . Breast cancer Neg Hx    Family history: Family history reviewed and father had COPD and emphysema.   Prior to Admission medications   Medication Sig Start Date End Date Taking? Authorizing Provider  albuterol (VENTOLIN HFA) 108 (90 Base) MCG/ACT inhaler Inhale 2 puffs into the lungs every 6 (six) hours as needed for wheezing or shortness of breath. 05/13/19   Tyler Pita, MD  ALPRAZolam Duanne Moron) 1 MG tablet TAKE 1 TABLET BY MOUTH EVERY 8 HOURS AS NEEDED 05/21/18   Birdie Sons, MD  ARIPiprazole (ABILIFY) 5 MG tablet Take 5 mg by mouth daily as needed (anxiety and depression).     [provider]  azithromycin (ZITHROMAX) 250 MG tablet Take 2 tablets PO on day one, and one tablet PO daily thereafter until completed. 01/07/20   Mar Daring, PA-C  benzonatate (TESSALON) 200 MG capsule Take 1 capsule (200 mg total) by mouth 3 (three) times daily as needed. 01/07/20   Mar Daring, PA-C  Budeson-Glycopyrrol-Formoterol (BREZTRI AEROSPHERE) 160-9-4.8 MCG/ACT AERO Inhale 2 puffs into the lungs in the morning and at bedtime. 05/13/19   Tyler Pita, MD  clonazePAM  (KLONOPIN) 1 MG tablet TAKE 1 TABLET (1 MG TOTAL) BY MOUTH 2 (TWO) TIMES DAILY AS NEEDED. 11/24/19   Birdie Sons, MD  dexlansoprazole (DEXILANT) 60 MG capsule Take 1 capsule (60 mg total) by mouth daily. **PLEASE SCHEDULE 1 YEAR FOLLOW UP APPT** 08/18/19   Lucilla Lame, MD  DUREZOL 0.05 % EMUL Place 1 drop into the left eye 5 (five) times daily. 11/09/19   [provider]  hydroxychloroquine (PLAQUENIL) 200 MG tablet TAKE 2 TABLETS (400 MG TOTAL) BY MOUTH DAILY. FOR DISCOID LUPUS (L93.0) 09/29/19   Birdie Sons, MD  Misc. Devices (PULSE OXIMETER DELUXE) MISC Use daily to monitor oxygen levels 01/07/20   Fenton Malling M, PA-C  nitroGLYCERIN (NITROSTAT) 0.4 MG SL tablet Place 1 tablet (0.4 mg total) under the tongue every 5 (five) minutes as needed for chest pain. Patient not taking: Reported on 12/03/2019 12/08/14   Wellington Hampshire, MD  predniSONE (DELTASONE) 10 MG tablet Take 6 tablets PO on day 1 and day 2, take 5 tablets PO on day 3 and day 4, take 4 tablets PO  on day 5 and day 6, take 3 tablets PO on day 7 and day 8, take 2 tablets PO on day 9 and day 10, take one tablet PO on day 11 and day 12. 01/07/20   Burnette, Clearnce Sorrel, PA-C  propranolol (INDERAL) 20 MG tablet Take 1 tablet (20 mg total) by mouth 2 (two) times daily. (BETA BLOCKER) Patient taking differently: Take 20 mg by mouth daily. Taking 1 tablet daily (BETA BLOCKER) 07/29/18   Fisher, Kirstie Peri, MD  Respiratory Therapy Supplies (FLUTTER) DEVI 1 Device by Does not apply route daily. 03/19/15   Theodoro Grist, MD   Physical Exam: Vitals:   01/08/20 1800 01/08/20 1815 01/08/20 1925 01/08/20 2018  BP: (!) 117/97  125/75 114/64  Pulse: (!) 109 (!) 114 (!) 111 98  Resp:   18 20  Temp:   98 F (36.7 C) (!) 97.4 F (36.3 C)  TempSrc:   Oral Oral  SpO2:  95% 94% 97%  Weight:    70 kg  Height:    5\' 3"  (1.6 m)   Constitutional: appears older than chronological age, NAD, calm, comfortable Eyes: PERRL, lids and  conjunctivae normal ENMT: Mucous membranes are moist. Posterior pharynx clear of any exudate or lesions. Age-appropriate dentition. Hearing appropriate Neck: normal, supple, no masses, no thyromegaly, no JVD Respiratory: clear to auscultation bilaterally, no wheezing, no crackles.  Increased respiratory effort.  Use of accessory muscle with breathing.  Cardiovascular: Regular rate and rhythm, no murmurs / rubs / gallops. No extremity edema. 2+ pedal pulses. No carotid bruits.  Abdomen: no tenderness, no masses palpated, no hepatosplenomegaly. Bowel sounds positive.  Musculoskeletal: no clubbing / cyanosis. No joint deformity upper and lower extremities. Good ROM, no contractures, no atrophy. Normal muscle tone.  Skin: no rashes, lesions, ulcers. No induration.  Bilateral lower extremity varicose veins present Neurologic: CN 2-12 grossly intact. Sensation intact. Strength 5/5 in all 4.  Psychiatric: Normal judgment and insight. Alert and oriented x 3. Normal mood.   EKG: Independently reviewed EKG on 01/07/2020, showing normal sinus rhythm, with rate of 98, no ST changes  Chest x-ray on Admission: Personally reviewed and I agree with radiologist reading as below.  Chest x-ray showed increased AP diameter with emphysema, no radiological evidence of pneumonia  DG Chest 2 View  Result Date: 01/08/2020 CLINICAL DATA:  54 year old female with history of shortness of breath. EXAM: CHEST - 2 VIEW COMPARISON:  Chest x-ray 01/06/2020. FINDINGS: Lung volumes are increased with emphysematous changes. No consolidative airspace disease. No pleural effusions. No pneumothorax. No pulmonary nodule or mass noted. Pulmonary vasculature and the cardiomediastinal silhouette are within normal limits. Atherosclerosis in the thoracic aorta. IMPRESSION: 1. No radiographic evidence of acute cardiopulmonary disease. 2. Emphysema. 3. Aortic atherosclerosis. Electronically Signed   By: Vinnie Langton M.D.   On: 01/08/2020  15:16   Labs on Admission: I have personally reviewed following labs  CBC: Recent Labs  Lab 01/06/20 1530 01/08/20 1422  WBC 8.7 12.5*  HGB 14.6 14.6  HCT 44.7 44.2  MCV 81.0 80.1  PLT 276 660   Basic Metabolic Panel: Recent Labs  Lab 01/06/20 1530 01/08/20 1422  NA 137 134*  K 3.6 3.9  CL 97* 98  CO2 30 26  GLUCOSE 110* 92  BUN 11 14  CREATININE 0.86 0.75  CALCIUM 9.3 9.0   GFR: Estimated Creatinine Clearance: 73.6 mL/min (by C-G formula based on SCr of 0.75 mg/dL).  Urine analysis:    Component Value Date/Time  COLORURINE STRAW (A) 11/18/2015 2148   APPEARANCEUR CLEAR (A) 11/18/2015 2148   APPEARANCEUR Clear 01/17/2014 2055   LABSPEC 1.002 (L) 11/18/2015 2148   LABSPEC 1.023 01/17/2014 2055   PHURINE 6.0 11/18/2015 2148   GLUCOSEU NEGATIVE 11/18/2015 2148   GLUCOSEU Negative 01/17/2014 2055   HGBUR NEGATIVE 11/18/2015 2148   BILIRUBINUR Negative 06/04/2019 1604   BILIRUBINUR Negative 01/17/2014 2055   KETONESUR NEGATIVE 11/18/2015 2148   PROTEINUR Negative 06/04/2019 1604   PROTEINUR NEGATIVE 11/18/2015 2148   UROBILINOGEN 0.2 06/04/2019 1604   NITRITE Negative 06/04/2019 1604   NITRITE NEGATIVE 11/18/2015 2148   LEUKOCYTESUR Negative 06/04/2019 1604   LEUKOCYTESUR Trace 01/17/2014 2055   Kynadi Dragos N Stein Windhorst D.O. [facility]  If 12AM-7AM, please contact overnight-coverage provider If 7AM-7PM, please contact day coverage provider [email or web address]  01/08/2020, 10:01 PM

## 2020-01-08 NOTE — ED Triage Notes (Signed)
Pt to ED via POV stating that she is having cough and shortness of breath. Pt was seen 2 days ago for the same. Pt states that she doesn't feel like she is getting any better and feels like it is "putting stress on my lungs". Pt is tachy in triage and sats in the low 90's.

## 2020-01-09 ENCOUNTER — Encounter: Payer: Self-pay | Admitting: Internal Medicine

## 2020-01-09 DIAGNOSIS — Z79899 Other long term (current) drug therapy: Secondary | ICD-10-CM | POA: Diagnosis not present

## 2020-01-09 DIAGNOSIS — Z90710 Acquired absence of both cervix and uterus: Secondary | ICD-10-CM | POA: Diagnosis not present

## 2020-01-09 DIAGNOSIS — I700 Atherosclerosis of aorta: Secondary | ICD-10-CM | POA: Diagnosis not present

## 2020-01-09 DIAGNOSIS — F329 Major depressive disorder, single episode, unspecified: Secondary | ICD-10-CM | POA: Diagnosis present

## 2020-01-09 DIAGNOSIS — R0602 Shortness of breath: Secondary | ICD-10-CM | POA: Diagnosis not present

## 2020-01-09 DIAGNOSIS — Z716 Tobacco abuse counseling: Secondary | ICD-10-CM | POA: Diagnosis not present

## 2020-01-09 DIAGNOSIS — J309 Allergic rhinitis, unspecified: Secondary | ICD-10-CM | POA: Diagnosis present

## 2020-01-09 DIAGNOSIS — M797 Fibromyalgia: Secondary | ICD-10-CM | POA: Diagnosis present

## 2020-01-09 DIAGNOSIS — I2510 Atherosclerotic heart disease of native coronary artery without angina pectoris: Secondary | ICD-10-CM | POA: Diagnosis present

## 2020-01-09 DIAGNOSIS — I119 Hypertensive heart disease without heart failure: Secondary | ICD-10-CM | POA: Diagnosis present

## 2020-01-09 DIAGNOSIS — G4700 Insomnia, unspecified: Secondary | ICD-10-CM | POA: Diagnosis present

## 2020-01-09 DIAGNOSIS — Z9049 Acquired absence of other specified parts of digestive tract: Secondary | ICD-10-CM | POA: Diagnosis not present

## 2020-01-09 DIAGNOSIS — D72829 Elevated white blood cell count, unspecified: Secondary | ICD-10-CM | POA: Diagnosis present

## 2020-01-09 DIAGNOSIS — K219 Gastro-esophageal reflux disease without esophagitis: Secondary | ICD-10-CM | POA: Diagnosis present

## 2020-01-09 DIAGNOSIS — Z7951 Long term (current) use of inhaled steroids: Secondary | ICD-10-CM | POA: Diagnosis not present

## 2020-01-09 DIAGNOSIS — Z8249 Family history of ischemic heart disease and other diseases of the circulatory system: Secondary | ICD-10-CM | POA: Diagnosis not present

## 2020-01-09 DIAGNOSIS — Z20822 Contact with and (suspected) exposure to covid-19: Secondary | ICD-10-CM | POA: Diagnosis present

## 2020-01-09 DIAGNOSIS — J432 Centrilobular emphysema: Secondary | ICD-10-CM | POA: Diagnosis not present

## 2020-01-09 DIAGNOSIS — J441 Chronic obstructive pulmonary disease with (acute) exacerbation: Secondary | ICD-10-CM | POA: Diagnosis not present

## 2020-01-09 DIAGNOSIS — G2581 Restless legs syndrome: Secondary | ICD-10-CM | POA: Diagnosis present

## 2020-01-09 DIAGNOSIS — F17210 Nicotine dependence, cigarettes, uncomplicated: Secondary | ICD-10-CM | POA: Diagnosis present

## 2020-01-09 DIAGNOSIS — J9601 Acute respiratory failure with hypoxia: Secondary | ICD-10-CM | POA: Diagnosis present

## 2020-01-09 DIAGNOSIS — Z7952 Long term (current) use of systemic steroids: Secondary | ICD-10-CM | POA: Diagnosis not present

## 2020-01-09 DIAGNOSIS — Z888 Allergy status to other drugs, medicaments and biological substances status: Secondary | ICD-10-CM | POA: Diagnosis not present

## 2020-01-09 DIAGNOSIS — F419 Anxiety disorder, unspecified: Secondary | ICD-10-CM | POA: Diagnosis present

## 2020-01-09 DIAGNOSIS — E785 Hyperlipidemia, unspecified: Secondary | ICD-10-CM | POA: Diagnosis present

## 2020-01-09 DIAGNOSIS — L930 Discoid lupus erythematosus: Secondary | ICD-10-CM | POA: Diagnosis present

## 2020-01-09 LAB — CBC
HCT: 40.8 % (ref 36.0–46.0)
Hemoglobin: 13.4 g/dL (ref 12.0–15.0)
MCH: 26.5 pg (ref 26.0–34.0)
MCHC: 32.8 g/dL (ref 30.0–36.0)
MCV: 80.6 fL (ref 80.0–100.0)
Platelets: 238 10*3/uL (ref 150–400)
RBC: 5.06 MIL/uL (ref 3.87–5.11)
RDW: 15.3 % (ref 11.5–15.5)
WBC: 9.4 10*3/uL (ref 4.0–10.5)
nRBC: 0 % (ref 0.0–0.2)

## 2020-01-09 LAB — BASIC METABOLIC PANEL
Anion gap: 6 (ref 5–15)
BUN: 15 mg/dL (ref 6–20)
CO2: 28 mmol/L (ref 22–32)
Calcium: 9.1 mg/dL (ref 8.9–10.3)
Chloride: 104 mmol/L (ref 98–111)
Creatinine, Ser: 0.71 mg/dL (ref 0.44–1.00)
GFR, Estimated: >60 mL/min (ref >60)
Glucose, Bld: 133 mg/dL — ABNORMAL HIGH (ref 70–99)
Potassium: 4.5 mmol/L (ref 3.5–5.1)
Sodium: 138 mmol/L (ref 135–145)

## 2020-01-09 LAB — HIV ANTIBODY (ROUTINE TESTING W REFLEX): HIV Screen 4th Generation wRfx: NONREACTIVE

## 2020-01-09 MED ORDER — ALPRAZOLAM 0.5 MG PO TABS: 1.0000 mg | ORAL_TABLET | Freq: Three times a day (TID) | ORAL | Status: DC | PRN

## 2020-01-09 MED ORDER — AZITHROMYCIN 250 MG PO TABS
500.0000 mg | ORAL_TABLET | Freq: Every day | ORAL | Status: DC
  Administered 2020-01-09: 500 mg via ORAL
  Filled 2020-01-09: qty 2

## 2020-01-09 MED ORDER — CLONAZEPAM 1 MG PO TABS
1.0000 mg | ORAL_TABLET | Freq: Three times a day (TID) | ORAL | Status: DC
  Administered 2020-01-09 – 2020-01-10 (×5): 1 mg via ORAL
  Filled 2020-01-09 (×5): qty 1

## 2020-01-09 NOTE — Progress Notes (Signed)
PHARMACIST - PHYSICIAN COMMUNICATION DR:   Priscella Mann CONCERNING: Antibiotic IV to Oral Route Change Policy  RECOMMENDATION: This patient is receiving azithromycin by the intravenous route.  Based on criteria approved by the Pharmacy and Therapeutics Committee, the antibiotic(s) is/are being converted to the equivalent oral dose form(s).   DESCRIPTION: These criteria include:  Patient being treated for a respiratory tract infection, urinary tract infection, cellulitis or clostridium difficile associated diarrhea if on metronidazole  The patient is not neutropenic and does not exhibit a GI malabsorption state  The patient is eating (either orally or via tube) and/or has been taking other orally administered medications for a least 24 hours  The patient is improving clinically and has a Tmax < 100.5  If you have questions about this conversion, please contact the [place], PharmD, BCPS Clinical Pharmacist 01/09/2020 3:10 PM

## 2020-01-09 NOTE — Progress Notes (Signed)
TOC SWCM acknowledges consult for Home Health/DME, Heart Failure/COPD. SWCM will continue to follow and will follow-up based on PT/OT recommendations once evaluations are completed.    Oretha Ellis, LCSW  Transitions of Care Dept.  [phone]

## 2020-01-09 NOTE — Progress Notes (Signed)
PROGRESS NOTE    Andrea [name]  XIP:[id number] DOB: 1965/08/15 DOA: 01/08/2020 PCP: Andrea Sons, MD   Brief Narrative: 54 y.o. female with medical history significant for stage II moderate copd, history of PVCs, chronic tobacco use, tobacco dependence, cough, previously not on home oxygen, hiatal hernia, anxiety with panic attacks, presents to the emergency department for chief concerns of shortness of breath.  Andrea [name] reports that the shortness of breath started 3 days ago and worsened today, prompting her to present to the emergency department for further evaluation.  Furthermore, she presented to her PCP the previous day and was prescribed outpatient medication without relief.  11/7: Patient reports improvement in her shortness of breath since admission.  Air movement remains poor.  Patient able to speak in complete sentences.   Assessment & Plan:   Principal Problem:   COPD exacerbation ([place]) Active Problems:   Shortness of breath   Tobacco abuse   Anxiety   Depression   Discoid lupus   Essential (primary) hypertension  Acute exacerbation of COPD Acute hypoxic respiratory failure secondary to above Patient required up to 4 L on admission Was on 2 L at time of my evaluation Reports her shortness of breath is improving Air movement remains poor Failed outpatient therapy Continues to smoke Plan: Continue intravenous steroids Bronchodilators, scheduled DuoNebs 3 times daily, Dulera inhaler, Ellipta inhaler Supplemental oxygen, wean as tolerated Stress incentive spirometry use Can consider MetaNeb therapy if not responding Continue azithromycin for anti-inflammatory effect Cough suppression as needed  Depression/anxiety/mood disorder Continue home Xanax as needed Continue home Klonopin scheduled Continue daily Abilify  Discoid lupus Plaquenil currently on hold while on steroid  Active tobacco use Counseled patient   DVT prophylaxis: Lovenox Code  Status: Full Family Communication:  Disposition Plan: Status is: Inpatient  Remains inpatient appropriate because:Inpatient level of care appropriate due to severity of illness   Dispo: The patient is from: Home              Anticipated d/c is to: Home              Anticipated d/c date is: 1 day              Patient currently is not medically stable to d/c.         Consultants:   none  Procedures:   none  Antimicrobials:   Azithromycin    Subjective: Seen and examined.  Still with some shortness of breath though improving over interval.  No pain complaints.  Objective: Vitals:   01/09/20 0505 01/09/20 0811 01/09/20 0835 01/09/20 1157  BP: 114/64  102/68 104/70  Pulse: 70  87 67  Resp: 20  20 18   Temp: 97.7 F (36.5 C)  97.9 F (36.6 C) 97.8 F (36.6 C)  TempSrc: Oral  Oral Oral  SpO2: 98% 94% 94% 93%  Weight:      Height:        Intake/Output Summary (Last 24 hours) at 01/09/2020 1347 Last data filed at 01/09/2020 0900 Gross per 24 hour  Intake 490 ml  Output 900 ml  Net -410 ml   Filed Weights   01/08/20 1417 01/08/20 2018  Weight: 70.3 kg 70 kg    Examination:  General exam: Appears calm and comfortable  Respiratory system: Diffusely diminished breath sounds bilaterally.  Scattered crackles.  Normal work of breathing.  2 L Cardiovascular system: S1 & S2 heard, RRR. No JVD, murmurs, rubs, gallops or clicks. No pedal edema. Gastrointestinal  system: Abdomen is nondistended, soft and nontender. No organomegaly or masses felt. Normal bowel sounds heard. Central nervous system: Alert and oriented. No focal neurological deficits. Extremities: Symmetric 5 x 5 power. Skin: No rashes, lesions or ulcers Psychiatry: Judgement and insight appear normal. Mood & affect appropriate.     Data Reviewed: I have personally reviewed following labs and imaging studies  CBC: Recent Labs  Lab 01/06/20 1530 01/08/20 1422 01/09/20 0351  WBC 8.7 12.5* 9.4    HGB 14.6 14.6 13.4  HCT 44.7 44.2 40.8  MCV 81.0 80.1 80.6  PLT 276 254 846   Basic Metabolic Panel: Recent Labs  Lab 01/06/20 1530 01/08/20 1422 01/09/20 0351  NA 137 134* 138  K 3.6 3.9 4.5  CL 97* 98 104  CO2 30 26 28   GLUCOSE 110* 92 133*  BUN 11 14 15   CREATININE 0.86 0.75 0.71  CALCIUM 9.3 9.0 9.1   GFR: Estimated Creatinine Clearance: 73.6 mL/min (by C-G formula based on SCr of 0.71 mg/dL). Liver Function Tests: No results for input(s): AST, ALT, ALKPHOS, BILITOT, PROT, ALBUMIN in the last 168 hours. No results for input(s): LIPASE, AMYLASE in the last 168 hours. No results for input(s): AMMONIA in the last 168 hours. Coagulation Profile: No results for input(s): INR, PROTIME in the last 168 hours. Cardiac Enzymes: No results for input(s): CKTOTAL, CKMB, CKMBINDEX, TROPONINI in the last 168 hours. BNP (last 3 results) No results for input(s): PROBNP in the last 8760 hours. HbA1C: No results for input(s): HGBA1C in the last 72 hours. CBG: No results for input(s): GLUCAP in the last 168 hours. Lipid Profile: No results for input(s): CHOL, HDL, LDLCALC, TRIG, CHOLHDL, LDLDIRECT in the last 72 hours. Thyroid Function Tests: No results for input(s): TSH, T4TOTAL, FREET4, T3FREE, THYROIDAB in the last 72 hours. Anemia Panel: No results for input(s): VITAMINB12, FOLATE, FERRITIN, TIBC, IRON, RETICCTPCT in the last 72 hours. Sepsis Labs: No results for input(s): PROCALCITON, LATICACIDVEN in the last 168 hours.  Recent Results (from the past 240 hour(s))  Respiratory Panel by RT PCR (Flu A&B, Covid) - Nasopharyngeal Swab     Status: None   Collection Time: 01/08/20  4:09 PM   Specimen: Nasopharyngeal Swab  Result Value Ref Range Status   SARS Coronavirus 2 by RT PCR NEGATIVE NEGATIVE Final    Comment: (NOTE) SARS-CoV-2 target nucleic acids are NOT DETECTED.  The SARS-CoV-2 RNA is generally detectable in upper respiratoy specimens during the acute phase of  infection. The lowest concentration of SARS-CoV-2 viral copies this assay can detect is 131 copies/mL. A negative result does not preclude SARS-Cov-2 infection and should not be used as the sole basis for treatment or other patient management decisions. A negative result may occur with  improper specimen collection/handling, submission of specimen other than nasopharyngeal swab, presence of viral mutation(s) within the areas targeted by this assay, and inadequate number of viral copies (<131 copies/mL). A negative result must be combined with clinical observations, patient history, and epidemiological information. The expected result is Negative.  Fact Sheet for Patients:  PinkCheek.be  Fact Sheet for Healthcare Providers:  GravelBags.it  This test is no t yet approved or cleared by the [place] FDA and  has been authorized for detection and/or diagnosis of SARS-CoV-2 by FDA under an Emergency Use Authorization (EUA). This EUA will remain  in effect (meaning this test can be used) for the duration of the COVID-19 declaration under Section 564(b)(1) of the Act, 21 U.S.C. section 360bbb-3(b)(1), unless  the authorization is terminated or revoked sooner.     Influenza A by PCR NEGATIVE NEGATIVE Final   Influenza B by PCR NEGATIVE NEGATIVE Final    Comment: (NOTE) The Xpert Xpress SARS-CoV-2/FLU/RSV assay is intended as an aid in  the diagnosis of influenza from Nasopharyngeal swab specimens and  should not be used as a sole basis for treatment. Nasal washings and  aspirates are unacceptable for Xpert Xpress SARS-CoV-2/FLU/RSV  testing.  Fact Sheet for Patients: PinkCheek.be  Fact Sheet for Healthcare Providers: GravelBags.it  This test is not yet approved or cleared by the [place] FDA and  has been authorized for detection and/or diagnosis of SARS-CoV-2  by  FDA under an Emergency Use Authorization (EUA). This EUA will remain  in effect (meaning this test can be used) for the duration of the  Covid-19 declaration under Section 564(b)(1) of the Act, 21  U.S.C. section 360bbb-3(b)(1), unless the authorization is  terminated or revoked. Performed at [facility], [address] [place], [place] [address]          Radiology Studies: DG Chest 2 View  Result Date: 01/08/2020 CLINICAL DATA:  54 year old female with history of shortness of breath. EXAM: CHEST - 2 VIEW COMPARISON:  Chest x-ray 01/06/2020. FINDINGS: Lung volumes are increased with emphysematous changes. No consolidative airspace disease. No pleural effusions. No pneumothorax. No pulmonary nodule or mass noted. Pulmonary vasculature and the cardiomediastinal silhouette are within normal limits. Atherosclerosis in the thoracic aorta. IMPRESSION: 1. No radiographic evidence of acute cardiopulmonary disease. 2. Emphysema. 3. Aortic atherosclerosis. Electronically Signed   By: Vinnie Langton M.D.   On: 01/08/2020 15:16        Scheduled Meds: . clonazePAM  1 mg Oral TID  . enoxaparin (LOVENOX) injection  40 mg Subcutaneous Q24H  . ipratropium-albuterol  3 mL Nebulization TID  . methylPREDNISolone (SOLU-MEDROL) injection  80 mg Intravenous Q24H   Followed by  . [START ON 01/12/2020] predniSONE  40 mg Oral Q breakfast  . mometasone-formoterol  2 puff Inhalation BID  . pantoprazole  40 mg Oral Daily  . propranolol  20 mg Oral Daily  . umeclidinium bromide  1 puff Inhalation Daily   Continuous Infusions: . azithromycin 500 mg (01/08/20 2226)     LOS: 0 days    Time spent: 25 minutes    Sidney Ace, MD [facility] Pager [phone]  If 7PM-7AM, please contact night-coverage 01/09/2020, 1:47 PM

## 2020-01-10 ENCOUNTER — Telehealth: Payer: Self-pay

## 2020-01-10 ENCOUNTER — Inpatient Hospital Stay: Payer: Medicare Other

## 2020-01-10 MED ORDER — BUDESONIDE 0.5 MG/2ML IN SUSP: 0.5000 mg | Freq: Two times a day (BID) | RESPIRATORY_TRACT | Status: DC

## 2020-01-10 MED ORDER — PREDNISONE 20 MG PO TABS: 20.0000 mg | ORAL_TABLET | Freq: Every day | ORAL | 0 refills | Status: DC

## 2020-01-10 MED ORDER — IPRATROPIUM-ALBUTEROL 0.5-2.5 (3) MG/3ML IN SOLN: 3.0000 mL | Freq: Four times a day (QID) | RESPIRATORY_TRACT | 0 refills | Status: DC | PRN

## 2020-01-10 MED ORDER — NICOTINE 21 MG/24HR TD PT24: 21.0000 mg | MEDICATED_PATCH | TRANSDERMAL | 0 refills | Status: DC

## 2020-01-10 NOTE — Telephone Encounter (Signed)
Per LG-- schedule HFU once discharged.  Will leave encounter open to ensure f/u.

## 2020-01-10 NOTE — TOC Initial Note (Signed)
Transition of Care [facility]) - Initial/Assessment Note    Patient Details  Name: Andrea [name] MRN: [id number] Date of Birth: 1965/09/02  [facility] CM/SW Contact:    Beverly Sessions, RN Phone Number: 01/10/2020, 2:14 PM  Clinical Narrative:                  Patient admitted from home with COPD Patient states that she lives at home with husband.   PCP [place] - children provide transportation to appointments  [facility] - Patient states she has the means to obtain her medications each month.   Bedside RN to check for qualifying home O2 sats  Patient states that she has a working nebulizer in the home  Patient agreeable for home health RN for COPD management.  Patient states that she does not have a preference of home health agency.  Referral made and accepted by Corene Cornea with [facility]  Expected Discharge Plan: (P) [place] Barriers to Discharge: (P) Continued Medical Work up   Patient Goals and CMS Choice        Expected Discharge Plan and Services Expected Discharge Plan: (P) [place]   Discharge Planning Services: (P) CM Consult   Living arrangements for the past 2 months: (P) Single Family Home                           HH Arranged: (P) RN [place] Agency: (P) [place] ([place]) Date [place] Contacted: (P) 01/10/20   Representative spoke with at [place]: (P) Corene Cornea  Prior Living Arrangements/Services Living arrangements for the past 2 months: (P) [place] with:: (P) Spouse   Do you feel safe going back to the place where you live?: (P) Yes          Current home services: (P) DME    Activities of Daily Living Home Assistive Devices/Equipment: None ADL Screening (condition at time of admission) Patient's cognitive ability adequate to safely complete daily activities?: Yes Is the patient deaf or have difficulty hearing?: No Does the patient have difficulty seeing,  even when wearing glasses/contacts?: No Does the patient have difficulty concentrating, remembering, or making decisions?: No Patient able to express need for assistance with ADLs?: No Does the patient have difficulty dressing or bathing?: No Independently performs ADLs?: Yes (appropriate for developmental age) Does the patient have difficulty walking or climbing stairs?: No Weakness of Legs: None Weakness of Arms/Hands: None  Permission Sought/Granted                  Emotional Assessment              Admission diagnosis:  Shortness of breath [R06.02] COPD exacerbation ([place]) [J44.1] Patient Active Problem List   Diagnosis Date Noted  . Personal history of colonic polyps   . Polyp of transverse colon   . Varicose veins of bilateral lower extremities with pain 11/22/2016  . Restless leg 11/18/2016  . Iron deficiency 11/18/2016  . Allergic rhinitis 06/11/2016  . Hyperlipidemia 03/19/2015  . COPD exacerbation ([place]) 10/04/2014  . Anxiety 09/22/2014  . Depression 09/22/2014  . Dyshidrosis 09/22/2014  . GERD (gastroesophageal reflux disease) 09/22/2014  . Insomnia 09/22/2014  . Lung nodule, multiple 09/22/2014  . Panic disorder 09/22/2014  . Palpitations 01/17/2014  . Pulmonary nodule 06/10/2013  . Shortness of breath 05/20/2013  . COPD, GOLD B 05/20/2013  . Tobacco  abuse 05/20/2013  . Daytime somnolence 05/20/2013  . Back pain 08/28/2012  . Discoid lupus 05/30/2012  . Generalized abdominal pain 03/11/2012  . Pain in joint 03/09/2012  . Psoriasis 02/21/2012  . Fibromyalgia 02/11/2012  . Lupus ([place]) 02/11/2012  . Panic attacks 02/11/2012  . History of adenomatous polyp of colon 09/13/2011  . Heartburn 08/06/2011  . PVC's (premature ventricular contractions) 01/29/2011  . Essential (primary) hypertension 03/04/1998   PCP:  Birdie Sons, MD Pharmacy:   [id number] [place], [place] - [address] [place] [address] Phone:  [phone] Fax: [phone]     Social Determinants of Health (SDOH) Interventions    Readmission Risk Interventions No flowsheet data found.

## 2020-01-10 NOTE — Discharge Summary (Signed)
Physician Discharge Summary  Andrea [name] STM:[id number] DOB: [birth year] DOA: 01/08/2020  PCP: Birdie Sons, MD  Admit date: 01/08/2020 Discharge date: 01/10/2020  Admitted From: Home Disposition:  Home  Recommendations for Outpatient Follow-up:  1. Follow up with PCP in 1-2 weeks 2. Follow-up with pulmonary Dr. Patsey Berthold ASAP  Home Health: Yes Equipment/Devices: No Discharge Condition: Stable CODE STATUS: Full Diet recommendation: Heart Health  Brief/Interim Summary:  54 y.o.femalewith medical history significant forstage II moderatecopd,history ofPVCs, chronic tobacco use,tobacco dependence,cough, previously not on home oxygen, hiatal hernia, anxiety with panic attacks, presents to the emergency department for chief concerns of shortness of breath.  Andrea [name] reports that the shortness of breathstarted 3 days ago and worsened today, prompting her to present to the emergency department for further evaluation.Furthermore, she presented to her PCP the previous day and was prescribed outpatient medication without relief.  11/7: Patient reports improvement in her shortness of breath since admission.  Air movement remains poor.  Patient able to speak in complete sentences.  11/8: Patient was weaned from submental oxygen.  She was ambulated in the hallway prior to discharge and did not desaturate.  I had a lengthy discussion with the patient regarding her severe emphysema.  I explained that smoking cessation would likely be the most effective measure to controlling her pulmonary symptoms.  Also communicated with patient's established pulmonologist Dr. Patsey Berthold.  Per Dr. Patsey Berthold recommending CAT scan chest to evaluate for parenchymal issues, lupus pneumonitis.  Ideally would have pursued CTA thorax to rule out chronic pulmonary emboli however patient has a contrast intolerance and we did not pursue the study at this time.  Ct non con chest was ordered, no active disease  noted.   Discharge Diagnoses:  Principal Problem:   COPD exacerbation ([place]) Active Problems:   Shortness of breath   Tobacco abuse   Anxiety   Depression   Discoid lupus   Essential (primary) hypertension  Acute exacerbation of COPD Acute hypoxic respiratory failure secondary to above Patient required up to 4 L on admission Was on 2 L at time of my evaluation Reports her shortness of breath is improving Air movement remains poor Failed outpatient therapy Continues to smoke Was weaned from supplemental oxygen with use of nebulizers, bronchodilators, steroids Ambulated in hallway prior to discharge, did not desaturate below 90% Communicated with established pulmonologist Dr. Patsey Berthold Recommended CAT scan chest to evaluate lung parenchyma prior to discharge Patient will discharge home in stable condition Prednisone 20 mg a day x5 days ordered, no taper  Prescription for DuoNeb left in chart  Also strongly recommended smoking cessation, nicotine patches prescribed   Depression/anxiety/mood disorder Continue home Xanax as needed Continue home Klonopin scheduled Continue daily Abilify  Discoid lupus Continue home Plaquenil   Active tobacco use Counseled patient  Discharge Instructions   Allergies as of 01/10/2020      Reactions   Alum & Mag Hydroxide-simeth Diarrhea, Nausea Only   Aspirin    Nosebleed    Cefdinir Other (See Comments)   tachycardia   Chantix [varenicline]    Bad dreams   Doxycycline Other (See Comments)   Nausea, migraine   Multaq [dronedarone] Other (See Comments)   Lip/ mouth numbness/ tongue swelling   Ivp Dye [iodinated Diagnostic Agents] Palpitations      Medication List    TAKE these medications   albuterol 108 (90 Base) MCG/ACT inhaler Commonly known as: VENTOLIN HFA Inhale 2 puffs into the lungs every 6 (six) hours as needed for wheezing  or shortness of breath.   ALPRAZolam 1 MG tablet Commonly known as: XANAX TAKE 1 TABLET BY  MOUTH EVERY 8 HOURS AS NEEDED   ARIPiprazole 5 MG tablet Commonly known as: ABILIFY Take 5 mg by mouth daily as needed (anxiety and depression).   azithromycin 250 MG tablet Commonly known as: ZITHROMAX Take 2 tablets PO on day one, and one tablet PO daily thereafter until completed.   benzonatate 200 MG capsule Commonly known as: TESSALON Take 1 capsule (200 mg total) by mouth 3 (three) times daily as needed.   Breztri Aerosphere 160-9-4.8 MCG/ACT Aero Generic drug: Budeson-Glycopyrrol-Formoterol Inhale 2 puffs into the lungs in the morning and at bedtime.   clonazePAM 1 MG tablet Commonly known as: KLONOPIN TAKE 1 TABLET (1 MG TOTAL) BY MOUTH 2 (TWO) TIMES DAILY AS NEEDED. What changed: when to take this   Dexilant 60 MG capsule Generic drug: dexlansoprazole Take 1 capsule (60 mg total) by mouth daily. **PLEASE SCHEDULE 1 YEAR FOLLOW UP APPT**   Durezol 0.05 % Emul Generic drug: Difluprednate Place 1 drop into the left eye 5 (five) times daily as needed (takes only during flares of uveitis).   Flutter Devi 1 Device by Does not apply route daily.   hydroxychloroquine 200 MG tablet Commonly known as: PLAQUENIL TAKE 2 TABLETS (400 MG TOTAL) BY MOUTH DAILY. FOR DISCOID LUPUS (L93.0)   ipratropium-albuterol 0.5-2.5 (3) MG/3ML Soln Commonly known as: DUONEB Take 3 mLs by nebulization every 6 (six) hours as needed.   nicotine 21 mg/24hr patch Commonly known as: NICODERM CQ - dosed in mg/24 hours Place 1 patch (21 mg total) onto the skin daily.   nitroGLYCERIN 0.4 MG SL tablet Commonly known as: NITROSTAT Place 1 tablet (0.4 mg total) under the tongue every 5 (five) minutes as needed for chest pain.   predniSONE 20 MG tablet Commonly known as: DELTASONE Take 1 tablet (20 mg total) by mouth daily with breakfast for 5 days. Start taking on: January 11, 2020 What changed:   medication strength  how much to take  how to take this  when to take this  additional  instructions   propranolol 20 MG tablet Commonly known as: INDERAL Take 1 tablet (20 mg total) by mouth 2 (two) times daily. (BETA BLOCKER) What changed:   when to take this  additional instructions   Pulse Oximeter Deluxe Misc Use daily to monitor oxygen levels       Allergies  Allergen Reactions   Alum & Mag Hydroxide-Simeth Diarrhea and Nausea Only   Aspirin     Nosebleed    Cefdinir Other (See Comments)    tachycardia   Chantix [Varenicline]     Bad dreams   Doxycycline Other (See Comments)    Nausea, migraine   Multaq [Dronedarone] Other (See Comments)    Lip/ mouth numbness/ tongue swelling   Ivp Dye [Iodinated Diagnostic Agents] Palpitations    Consultations:  None   Procedures/Studies: DG Chest 2 View  Result Date: 01/08/2020 CLINICAL DATA:  54 year old female with history of shortness of breath. EXAM: CHEST - 2 VIEW COMPARISON:  Chest x-ray 01/06/2020. FINDINGS: Lung volumes are increased with emphysematous changes. No consolidative airspace disease. No pleural effusions. No pneumothorax. No pulmonary nodule or mass noted. Pulmonary vasculature and the cardiomediastinal silhouette are within normal limits. Atherosclerosis in the thoracic aorta. IMPRESSION: 1. No radiographic evidence of acute cardiopulmonary disease. 2. Emphysema. 3. Aortic atherosclerosis. Electronically Signed   By: Vinnie Langton M.D.   On: 01/08/2020  15:16   DG Chest 2 View  Result Date: 01/06/2020 CLINICAL DATA:  Shortness of breath, chest x-ray, cough EXAM: CHEST - 2 VIEW COMPARISON:  Chest x-ray 02/13/2018, CT chest 12/14/2018 FINDINGS: The heart size and mediastinal contours are within normal limits. Aortic arch calcification. Tortuosity of the aorta. Hyperinflation of the lungs consistent with emphysematous changes. No focal consolidation. No pulmonary edema. No pleural effusion. No pneumothorax. No acute osseous abnormality. IMPRESSION: No active cardiopulmonary disease.  Electronically Signed   By: Iven Finn M.D.   On: 01/06/2020 16:46    (Echo, Carotid, EGD, Colonoscopy, ERCP)    Subjective: Seen and examined on day of discharge.  Endorses some fatigue but shortness of breath improved.  Stable for discharge home at this time.  Discharge Exam: Vitals:   01/10/20 0803 01/10/20 1121  BP:  134/87  Pulse:  67  Resp:  17  Temp:  (!) 97.5 F (36.4 C)  SpO2: 94% 94%   Vitals:   01/10/20 0355 01/10/20 0800 01/10/20 0803 01/10/20 1121  BP: 110/73 102/65  134/87  Pulse: 70 82  67  Resp: 17 17  17   Temp: 98 F (36.7 C) (!) 97.4 F (36.3 C)  (!) 97.5 F (36.4 C)  TempSrc:  Oral  Oral  SpO2: 93% 93% 94% 94%  Weight:      Height:        General: Pt is alert, awake, not in acute distress Cardiovascular: RRR, S1/S2 +, no rubs, no gallops Respiratory: Mild end expiratory wheeze bilaterally.  Air entry equal. Abdominal: Soft, NT, ND, bowel sounds + Extremities: no edema, no cyanosis    The results of significant diagnostics from this hospitalization (including imaging, microbiology, ancillary and laboratory) are listed below for reference.     Microbiology: Recent Results (from the past 240 hour(s))  Respiratory Panel by RT PCR (Flu A&B, Covid) - Nasopharyngeal Swab     Status: None   Collection Time: 01/08/20  4:09 PM   Specimen: Nasopharyngeal Swab  Result Value Ref Range Status   SARS Coronavirus 2 by RT PCR NEGATIVE NEGATIVE Final    Comment: (NOTE) SARS-CoV-2 target nucleic acids are NOT DETECTED.  The SARS-CoV-2 RNA is generally detectable in upper respiratoy specimens during the acute phase of infection. The lowest concentration of SARS-CoV-2 viral copies this assay can detect is 131 copies/mL. A negative result does not preclude SARS-Cov-2 infection and should not be used as the sole basis for treatment or other patient management decisions. A negative result may occur with  improper specimen collection/handling, submission of  specimen other than nasopharyngeal swab, presence of viral mutation(s) within the areas targeted by this assay, and inadequate number of viral copies (<131 copies/mL). A negative result must be combined with clinical observations, patient history, and epidemiological information. The expected result is Negative.  Fact Sheet for Patients:  PinkCheek.be  Fact Sheet for Healthcare Providers:  GravelBags.it  This test is no t yet approved or cleared by the [place] FDA and  has been authorized for detection and/or diagnosis of SARS-CoV-2 by FDA under an Emergency Use Authorization (EUA). This EUA will remain  in effect (meaning this test can be used) for the duration of the COVID-19 declaration under Section 564(b)(1) of the Act, 21 U.S.C. section 360bbb-3(b)(1), unless the authorization is terminated or revoked sooner.     Influenza A by PCR NEGATIVE NEGATIVE Final   Influenza B by PCR NEGATIVE NEGATIVE Final    Comment: (NOTE) The Xpert Xpress SARS-CoV-2/FLU/RSV assay  is intended as an aid in  the diagnosis of influenza from Nasopharyngeal swab specimens and  should not be used as a sole basis for treatment. Nasal washings and  aspirates are unacceptable for Xpert Xpress SARS-CoV-2/FLU/RSV  testing.  Fact Sheet for Patients: PinkCheek.be  Fact Sheet for Healthcare Providers: GravelBags.it  This test is not yet approved or cleared by the [place] FDA and  has been authorized for detection and/or diagnosis of SARS-CoV-2 by  FDA under an Emergency Use Authorization (EUA). This EUA will remain  in effect (meaning this test can be used) for the duration of the  Covid-19 declaration under Section 564(b)(1) of the Act, 21  U.S.C. section 360bbb-3(b)(1), unless the authorization is  terminated or revoked. Performed at [facility], [address] [place], [place] [address]      Labs: BNP (last 3 results) No results for input(s): BNP in the last 8760 hours. Basic Metabolic Panel: Recent Labs  Lab 01/06/20 1530 01/08/20 1422 01/09/20 0351  NA 137 134* 138  K 3.6 3.9 4.5  CL 97* 98 104  CO2 30 26 28   GLUCOSE 110* 92 133*  BUN 11 14 15   CREATININE 0.86 0.75 0.71  CALCIUM 9.3 9.0 9.1   Liver Function Tests: No results for input(s): AST, ALT, ALKPHOS, BILITOT, PROT, ALBUMIN in the last 168 hours. No results for input(s): LIPASE, AMYLASE in the last 168 hours. No results for input(s): AMMONIA in the last 168 hours. CBC: Recent Labs  Lab 01/06/20 1530 01/08/20 1422 01/09/20 0351  WBC 8.7 12.5* 9.4  HGB 14.6 14.6 13.4  HCT 44.7 44.2 40.8  MCV 81.0 80.1 80.6  PLT 276 254 238   Cardiac Enzymes: No results for input(s): CKTOTAL, CKMB, CKMBINDEX, TROPONINI in the last 168 hours. BNP: Invalid input(s): POCBNP CBG: No results for input(s): GLUCAP in the last 168 hours. D-Dimer No results for input(s): DDIMER in the last 72 hours. Hgb A1c No results for input(s): HGBA1C in the last 72 hours. Lipid Profile No results for input(s): CHOL, HDL, LDLCALC, TRIG, CHOLHDL, LDLDIRECT in the last 72 hours. Thyroid function studies No results for input(s): TSH, T4TOTAL, T3FREE, THYROIDAB in the last 72 hours.  Invalid input(s): FREET3 Anemia work up No results for input(s): VITAMINB12, FOLATE, FERRITIN, TIBC, IRON, RETICCTPCT in the last 72 hours. Urinalysis    Component Value Date/Time   COLORURINE STRAW (A) 11/18/2015 2148   APPEARANCEUR CLEAR (A) 11/18/2015 2148   APPEARANCEUR Clear 01/17/2014 2055   LABSPEC 1.002 (L) 11/18/2015 2148   LABSPEC 1.023 01/17/2014 2055   PHURINE 6.0 11/18/2015 2148   GLUCOSEU NEGATIVE 11/18/2015 2148   GLUCOSEU Negative 01/17/2014 2055   HGBUR NEGATIVE 11/18/2015 2148   BILIRUBINUR Negative 06/04/2019 1604   BILIRUBINUR Negative 01/17/2014 2055   KETONESUR NEGATIVE 11/18/2015 2148    PROTEINUR Negative 06/04/2019 1604   PROTEINUR NEGATIVE 11/18/2015 2148   UROBILINOGEN 0.2 06/04/2019 1604   NITRITE Negative 06/04/2019 1604   NITRITE NEGATIVE 11/18/2015 2148   LEUKOCYTESUR Negative 06/04/2019 1604   LEUKOCYTESUR Trace 01/17/2014 2055   Sepsis Labs Invalid input(s): PROCALCITONIN,  WBC,  LACTICIDVEN Microbiology Recent Results (from the past 240 hour(s))  Respiratory Panel by RT PCR (Flu A&B, Covid) - Nasopharyngeal Swab     Status: None   Collection Time: 01/08/20  4:09 PM   Specimen: Nasopharyngeal Swab  Result Value Ref Range Status   SARS Coronavirus 2 by RT PCR NEGATIVE NEGATIVE Final    Comment: (NOTE) SARS-CoV-2 target nucleic  acids are NOT DETECTED.  The SARS-CoV-2 RNA is generally detectable in upper respiratoy specimens during the acute phase of infection. The lowest concentration of SARS-CoV-2 viral copies this assay can detect is 131 copies/mL. A negative result does not preclude SARS-Cov-2 infection and should not be used as the sole basis for treatment or other patient management decisions. A negative result may occur with  improper specimen collection/handling, submission of specimen other than nasopharyngeal swab, presence of viral mutation(s) within the areas targeted by this assay, and inadequate number of viral copies (<131 copies/mL). A negative result must be combined with clinical observations, patient history, and epidemiological information. The expected result is Negative.  Fact Sheet for Patients:  PinkCheek.be  Fact Sheet for Healthcare Providers:  GravelBags.it  This test is no t yet approved or cleared by the [place] FDA and  has been authorized for detection and/or diagnosis of SARS-CoV-2 by FDA under an Emergency Use Authorization (EUA). This EUA will remain  in effect (meaning this test can be used) for the duration of the COVID-19 declaration under Section  564(b)(1) of the Act, 21 U.S.C. section 360bbb-3(b)(1), unless the authorization is terminated or revoked sooner.     Influenza A by PCR NEGATIVE NEGATIVE Final   Influenza B by PCR NEGATIVE NEGATIVE Final    Comment: (NOTE) The Xpert Xpress SARS-CoV-2/FLU/RSV assay is intended as an aid in  the diagnosis of influenza from Nasopharyngeal swab specimens and  should not be used as a sole basis for treatment. Nasal washings and  aspirates are unacceptable for Xpert Xpress SARS-CoV-2/FLU/RSV  testing.  Fact Sheet for Patients: PinkCheek.be  Fact Sheet for Healthcare Providers: GravelBags.it  This test is not yet approved or cleared by the [place] FDA and  has been authorized for detection and/or diagnosis of SARS-CoV-2 by  FDA under an Emergency Use Authorization (EUA). This EUA will remain  in effect (meaning this test can be used) for the duration of the  Covid-19 declaration under Section 564(b)(1) of the Act, 21  U.S.C. section 360bbb-3(b)(1), unless the authorization is  terminated or revoked. Performed at [facility], [address], [place], [place] [address]      Time coordinating discharge: Over 30 minutes  SIGNED:   Sidney Ace, MD  [facility] 01/10/2020, 2:37 PM Pager   If 7PM-7AM, please contact night-coverage

## 2020-01-10 NOTE — Discharge Instructions (Signed)
Chronic Obstructive Pulmonary Disease Exacerbation  Chronic obstructive pulmonary disease (COPD) is a long-term (chronic) condition that affects the lungs. COPD is a general term that can be used to describe many different lung problems that cause lung swelling (inflammation) and limit airflow, including chronic bronchitis and emphysema. COPD exacerbations are episodes when breathing symptoms become much worse and require extra treatment. COPD exacerbations are usually caused by infections. Without treatment, COPD exacerbations can be severe and even life threatening. Frequent COPD exacerbations can cause further damage to the lungs. What are the causes? This condition may be caused by:  Respiratory infections, including viral and bacterial infections.  Exposure to smoke.  Exposure to air pollution, chemical fumes, or dust.  Things that give you an allergic reaction (allergens).  Not taking your usual COPD medicines as directed.  Underlying medical problems, such as congestive heart failure or infections not involving the lungs. In many cases, the cause (trigger) of this condition is not known. What increases the risk? The following factors may make you more likely to develop this condition:  Smoking cigarettes.  Old age.  Frequent prior COPD exacerbations. What are the signs or symptoms? Symptoms of this condition include:  Increased coughing.  Increased production of mucus from your lungs (sputum).  Increased wheezing.  Increased shortness of breath.  Rapid or labored breathing.  Chest tightness.  Less energy than usual.  Sleep disruption from symptoms.  Confusion or increased sleepiness. Often these symptoms happen or get worse even with the use of medicines. How is this diagnosed? This condition is diagnosed based on:  Your medical history.  A physical exam. You may also have tests, including:  A chest X-ray.  Blood tests.  Lung (pulmonary) function  tests. How is this treated? Treatment for this condition depends on the severity and cause of the symptoms. You may need to be admitted to a hospital for treatment. Some of the treatments commonly used to treat COPD exacerbations are:  Antibiotic medicines. These may be used for severe exacerbations caused by a lung infection, such as pneumonia.  Bronchodilators. These are inhaled medicines that expand the air passages and allow increased airflow.  Steroid medicines. These act to reduce inflammation in the airways. They may be given with an inhaler, taken by mouth, or given through an IV tube inserted into one of your veins.  Supplemental oxygen therapy.  Airway clearing techniques, such as noninvasive ventilation (NIV) and positive expiratory pressure (PEP). These provide respiratory support through a mask or other noninvasive device. An example of this would be using a continuous positive airway pressure (CPAP) machine to improve delivery of oxygen into your lungs. Follow these instructions at home: Medicines  Take over-the-counter and prescription medicines only as told by your health care provider. It is important to use correct technique with inhaled medicines.  If you were prescribed an antibiotic medicine or oral steroid, take it as told by your health care provider. Do not stop taking the medicine even if you start to feel better. Lifestyle  Eat a healthy diet.  Exercise regularly.  Get plenty of sleep.  Avoid exposure to all substances that irritate the airway, especially to tobacco smoke.  Wash your hands often with soap and water to reduce the risk of infection. If soap and water are not available, use hand sanitizer.  During flu season, avoid enclosed spaces that are crowded with people. General instructions  Drink enough fluid to keep your urine clear or pale yellow (unless you have a medical   condition that requires fluid restriction).  Use a cool mist vaporizer. This  humidifies the air and makes it easier for you to clear your chest when you cough.  If you have a home nebulizer and oxygen, continue to use them as told by your health care provider.  Keep all follow-up visits as told by your health care provider. This is important. How is this prevented?  Stay up-to-date on pneumococcal and influenza (flu) vaccines. A flu shot is recommended every year to help prevent exacerbations.  Do not use any products that contain nicotine or tobacco, such as cigarettes and e-cigarettes. Quitting smoking is very important in preventing COPD from getting worse and in preventing exacerbations from happening as often. If you need help quitting, ask your health care provider.  Follow all instructions for pulmonary rehabilitation after a recent exacerbation. This can help prevent future exacerbations.  Work with your health care provider to develop and follow an action plan. This tells you what steps to take when you experience certain symptoms. Contact a health care provider if:  You have a worsening of your regular COPD symptoms. Get help right away if:  You have worsening shortness of breath, even when resting.  You have trouble talking.  You have severe chest pain.  You cough up blood.  You have a fever.  You have weakness, vomit repeatedly, or faint.  You feel confused.  You are not able to sleep because of your symptoms.  You have trouble doing daily activities. Summary  COPD exacerbations are episodes when breathing symptoms become much worse and require extra treatment above your normal treatment.  Exacerbations can be severe and even life threatening. Frequent COPD exacerbations can cause further damage to your lungs.  COPD exacerbations are usually triggered by infections such as the flu, colds, and even pneumonia.  Treatment for this condition depends on the severity and cause of the symptoms. You may need to be admitted to a hospital for  treatment.  Quitting smoking is very important to prevent COPD from getting worse and to prevent exacerbations from happening as often. This information is not intended to replace advice given to you by your health care provider. Make sure you discuss any questions you have with your health care provider. Document Revised: 01/31/2017 Document Reviewed: 03/25/2016 Elsevier Patient Education  2020 [employer]  

## 2020-01-11 ENCOUNTER — Telehealth: Payer: Self-pay

## 2020-01-11 ENCOUNTER — Encounter: Payer: Self-pay | Admitting: Family Medicine

## 2020-01-11 ENCOUNTER — Ambulatory Visit: Payer: Medicare Other | Admitting: Pulmonary Disease

## 2020-01-11 NOTE — Telephone Encounter (Signed)
Appointment scheduled. Patient advised.  

## 2020-01-11 NOTE — Telephone Encounter (Signed)
HFU not scheduled at this time.

## 2020-01-11 NOTE — Telephone Encounter (Signed)
Patient needs a HFU within 5 days per [facility]. There are no available appts. Ok to work the patient in?

## 2020-01-11 NOTE — Telephone Encounter (Addendum)
appt scheduled for 01/13/2020 at 3:00--okay per Dr. Patsey Berthold verbally.  Patient is aware and voiced her understanding.  Nothing further needed.

## 2020-01-11 NOTE — Telephone Encounter (Signed)
Transition Care Management Unsuccessful Follow-up Telephone Call  Date of discharge and from where: [facility] on 01/10/20  Attempts:  1st Attempt  Reason for unsuccessful TCM follow-up call:  Left voice message

## 2020-01-11 NOTE — Telephone Encounter (Signed)
I can see her at 3:40 on Monday

## 2020-01-12 ENCOUNTER — Ambulatory Visit: Payer: Medicare Other | Admitting: Surgery

## 2020-01-12 ENCOUNTER — Telehealth: Payer: Self-pay | Admitting: Family Medicine

## 2020-01-12 NOTE — Telephone Encounter (Signed)
Patient advised and verbalized understanding 

## 2020-01-12 NOTE — Telephone Encounter (Signed)
Transition Care Management Follow-up Telephone Call  Date of discharge and from where: [facility] on 01/10/20  How have you been since you were released from the hospital? Pt states she is doing slighly better but still tired and weak. Pt is breathing better but still SOB at times. Pt has not been able to get her nebulizer medication because it was not labeled right. Pharmacy is in the process of sending it back to the physician at the hospital to get the rx coded right. Pt is using her inhalers for the SOB. Pt states she is still congested, has sinus pressure and is blowing out blood. Pt has PND but is unable to cough it up.  Pt was on Azithromycin and Prednisone in the hospital but was taken off of it prior to d/c. Pt is not taking anything OTC for her s/s and she feels she need to be on something to control the congestion and drainage. Pt did had diarrhea but thinks that was related to taking Azithromycin. Declines fever, pain or n/v.  Any questions or concerns? Pt is wanting to know if she needs to be on another abx due to symptoms not getting better.   Items Reviewed:  Did the pt receive and understand the discharge instructions provided? Yes   Medications obtained and verified? Yes   Any new allergies since your discharge? No   Dietary orders reviewed? Yes  Do you have support at home? Yes   Other (ie: DME, Home Health, etc): HH was supposed to be ordered to check pts O2 stats but pt had not heard from the facility yet. No facility listed on d/c papers. Advised pt to f/u with pulmonologist.   Functional Questionnaire: (I = Independent and D = Dependent)  Bathing/Dressing- I   Meal Prep- I  Eating- I  Maintaining continence- I  Transferring/Ambulation- I  Managing Meds- I   Follow up appointments reviewed:    PCP Hospital f/u appt confirmed? Yes  scheduled to see Dr Caryn Section on 01/17/20 @ 3:40.  [facility] f/u appt confirmed? Yes    Are transportation arrangements  needed? No   If their condition worsens, is the pt aware to call  their PCP or go to the ED? Yes  Was the patient provided with contact information for the PCP's office or ED? Yes  Was the pt encouraged to call back with questions or concerns? Yes

## 2020-01-12 NOTE — Telephone Encounter (Signed)
Jeani Hawking advised as below.

## 2020-01-12 NOTE — Telephone Encounter (Signed)
She had a chest CT on Monday and there was no sign of infection, so I don't think she needs an antibiotic. Sinus congestion is likely due to weather changes. She should use OTC saline nasal spray every 3-4 hours throughout the day.

## 2020-01-12 NOTE — Telephone Encounter (Signed)
Jeani Hawking, from [facility], calling to get approval for the pts Nursing start of care on 01/14/20. Please advise.      [phone] Secure

## 2020-01-12 NOTE — Telephone Encounter (Signed)
That's fine

## 2020-01-13 ENCOUNTER — Other Ambulatory Visit: Payer: Self-pay | Admitting: Family Medicine

## 2020-01-13 ENCOUNTER — Telehealth: Payer: Self-pay | Admitting: Pulmonary Disease

## 2020-01-13 ENCOUNTER — Encounter: Payer: Self-pay | Admitting: Pulmonary Disease

## 2020-01-13 ENCOUNTER — Other Ambulatory Visit: Payer: Self-pay

## 2020-01-13 ENCOUNTER — Ambulatory Visit (INDEPENDENT_AMBULATORY_CARE_PROVIDER_SITE_OTHER): Payer: Medicare Other | Admitting: Pulmonary Disease

## 2020-01-13 VITALS — BP 118/82 | HR 95 | Temp 97.5°F | Ht 63.0 in | Wt 154.0 lb

## 2020-01-13 DIAGNOSIS — F17210 Nicotine dependence, cigarettes, uncomplicated: Secondary | ICD-10-CM

## 2020-01-13 DIAGNOSIS — R0602 Shortness of breath: Secondary | ICD-10-CM | POA: Diagnosis not present

## 2020-01-13 DIAGNOSIS — J441 Chronic obstructive pulmonary disease with (acute) exacerbation: Secondary | ICD-10-CM | POA: Diagnosis not present

## 2020-01-13 DIAGNOSIS — R002 Palpitations: Secondary | ICD-10-CM

## 2020-01-13 MED ORDER — SULFAMETHOXAZOLE-TRIMETHOPRIM 400-80 MG PO TABS: 1.0000 | ORAL_TABLET | Freq: Two times a day (BID) | ORAL | 0 refills | Status: DC

## 2020-01-13 MED ORDER — ALBUTEROL SULFATE (2.5 MG/3ML) 0.083% IN NEBU: 2.5000 mg | INHALATION_SOLUTION | RESPIRATORY_TRACT | 2 refills | Status: DC | PRN

## 2020-01-13 MED ORDER — AEROCHAMBER MV MISC: 0 refills | Status: DC

## 2020-01-13 MED ORDER — PREDNISONE 10 MG (21) PO TBPK: ORAL_TABLET | ORAL | 0 refills | Status: DC

## 2020-01-13 NOTE — Progress Notes (Signed)
Subjective:    Patient ID: Andrea [name], female    DOB: [birth year], 55 y.o.   MRN: [id number]  HPI This is a 54 year old current smoker (2 to 3 cigarettes/day prior half PPD)last evaluated by me on 03 December 2019 at that time we saw her for follow-up on her COPD.  Previous  Tis where in 2015, showing moderate/stage II COPD.  She has not had PFTs since then.  Are ordered on 1 October however not scheduled yet.  They are seeing her today post hospitalization for COPD exacerbation.  Admitted on 6 November and discharged on 8 November her prior visit she had a normal ambulatory oximetry.  She also had ambulatory oximetry prior to discharge from the hospital which did not show oxygen desaturations beyond 90%.   She was discharged theoretically on antibiotics, prednisone and nebulizer solution, however she did not receive any of these.  She is currently not taking anything, she notes increase nasal congestion and drainage and increased shortness of breath.  She is using Breztri twice a day.  Is also using as needed albuterol.  She notes that albuterol nebulizer helps her quite a bit.  She has not had fevers, chills or sweats.  No chest pain.  No orthopnea.  No other symptomatology.    Review of Systems A 10 point review of systems was performed and it is as noted above otherwise negative.  Patient Active Problem List   Diagnosis Date Noted  . Personal history of colonic polyps   . Polyp of transverse colon   . Varicose veins of bilateral lower extremities with pain 11/22/2016  . Restless leg 11/18/2016  . Iron deficiency 11/18/2016  . Allergic rhinitis 06/11/2016  . Hyperlipidemia 03/19/2015  . COPD exacerbation ([place]) 10/04/2014  . Anxiety 09/22/2014  . Depression 09/22/2014  . Dyshidrosis 09/22/2014  . GERD (gastroesophageal reflux disease) 09/22/2014  . Insomnia 09/22/2014  . Lung nodule, multiple 09/22/2014  . Panic disorder 09/22/2014  . Palpitations 01/17/2014  . Pulmonary nodule  06/10/2013  . Shortness of breath 05/20/2013  . COPD, GOLD B 05/20/2013  . Tobacco abuse 05/20/2013  . Daytime somnolence 05/20/2013  . Back pain 08/28/2012  . Discoid lupus 05/30/2012  . Generalized abdominal pain 03/11/2012  . Pain in joint 03/09/2012  . Psoriasis 02/21/2012  . Fibromyalgia 02/11/2012  . Lupus ([place]) 02/11/2012  . Panic attacks 02/11/2012  . History of adenomatous polyp of colon 09/13/2011  . Heartburn 08/06/2011  . PVC's (premature ventricular contractions) 01/29/2011  . Essential (primary) hypertension 03/04/1998    Allergies  Allergen Reactions  . Alum & Mag Hydroxide-Simeth Diarrhea and Nausea Only  . Aspirin     Nosebleed   . Cefdinir Other (See Comments)    tachycardia  . Chantix [Varenicline]     Bad dreams  . Doxycycline Other (See Comments)    Nausea, migraine  . Multaq [Dronedarone] Other (See Comments)    Lip/ mouth numbness/ tongue swelling  . Ivp Dye [Iodinated Diagnostic Agents] Palpitations    Current Meds  Medication Sig  . albuterol (VENTOLIN HFA) 108 (90 Base) MCG/ACT inhaler Inhale 2 puffs into the lungs every 6 (six) hours as needed for wheezing or shortness of breath.  . Budeson-Glycopyrrol-Formoterol (BREZTRI AEROSPHERE) 160-9-4.8 MCG/ACT AERO Inhale 2 puffs into the lungs in the morning and at bedtime.  . clonazePAM (KLONOPIN) 1 MG tablet TAKE 1 TABLET (1 MG TOTAL) BY MOUTH 2 (TWO) TIMES DAILY AS NEEDED. (Patient taking differently: Take 1 mg by mouth 3 (  three) times daily. )  . dexlansoprazole (DEXILANT) 60 MG capsule Take 1 capsule (60 mg total) by mouth daily. **PLEASE SCHEDULE 1 YEAR FOLLOW UP APPT**  . DUREZOL 0.05 % EMUL Place 1 drop into the left eye 5 (five) times daily as needed (takes only during flares of uveitis).   . hydroxychloroquine (PLAQUENIL) 200 MG tablet TAKE 2 TABLETS (400 MG TOTAL) BY MOUTH DAILY. FOR DISCOID LUPUS (L93.0)  . propranolol (INDERAL) 20 MG tablet TAKE 1 TABLET (20 MG TOTAL) BY MOUTH 2 (TWO) TIMES  DAILY. (BETA BLOCKER)  . Respiratory Therapy Supplies (FLUTTER) DEVI 1 Device by Does not apply route daily.      Objective:   Physical Exam BP 118/82 (BP Location: Left Arm, Patient Position: Sitting, Cuff Size: Normal)   Pulse 95   Temp (!) 97.5 F (36.4 C) (Temporal)   Ht 5\' 3"  (1.6 m)   Wt 154 lb (69.9 kg)   SpO2 94%   BMI 27.28 kg/m  GENERAL: Awake, alert, fully ambulatory, looks acutely ill but not toxic.  In no respiratory distress.  Nasal quality to the speech. HEAD: Normocephalic, atraumatic.  EYES: Pupils equal, round, reactive to light.  No scleral icterus.  MOUTH: Nose/mouth/throat not examined due to masking requirements for COVID 19.   NECK: Supple. No thyromegaly. Trachea midline. No JVD.  No adenopathy. PULMONARY: Good air entry bilaterally.  There are coarse breath sounds and rhonchi throughout, no wheezes. CARDIOVASCULAR: S1 and S2. Regular rate and rhythm.  No rubs, murmurs or gallops heard. ABDOMEN: Benign. MUSCULOSKELETAL: No joint deformity, no clubbing, no edema.  NEUROLOGIC: No overt focal deficit.  Speech is fluent.  No gait disturbance. SKIN: Intact,warm,dry. PSYCH: Mood is anxious, behavior normal.  CT scan of the chest performed prior to discharge showed emphysematous changes with no acute infiltrate.       Assessment & Plan:     ICD-10-CM   1. COPD exacerbation (HCC)  J44.1    Treat with Bactrim and prednisone taper Albuterol nebulization solution sent in for as needed use Follow-up in 2 to 3 weeks with me or APP  2. Shortness of breath  R06.02    PFTs are pending Issue worsened with most recent exacerbation Chronic issue  3. Tobacco dependence due to cigarettes  F17.210    He has had quit by decreasing amounts of cigarettes during day   Meds ordered this encounter  Medications  . albuterol (PROVENTIL) (2.5 MG/3ML) 0.083% nebulizer solution    Sig: Take 3 mLs (2.5 mg total) by nebulization every 4 (four) hours as needed for wheezing or  shortness of breath.    Dispense:  75 mL    Refill:  2    J44.9  . Spacer/Aero-Holding Chambers (AEROCHAMBER MV) inhaler    Sig: Use as instructed    Dispense:  1 each    Refill:  0  . sulfamethoxazole-trimethoprim (BACTRIM) 400-80 MG tablet    Sig: Take 1 tablet by mouth 2 (two) times daily for 7 days.    Dispense:  14 tablet    Refill:  0  . predniSONE (STERAPRED UNI-PAK 21 TAB) 10 MG (21) TBPK tablet    Sig: As directed in package    Dispense:  1 each    Refill:  0   C. Derrill Kay, MD [facility]   *This note was dictated using voice recognition software/Dragon.  Despite best efforts to proofread, errors can occur which can change the meaning.  Any change was purely unintentional.

## 2020-01-13 NOTE — Patient Instructions (Signed)
We sent a prescription for an antibiotic to take twice a day  We also sent a prescription for prednisone taper pack  A prescription for your albuterol for your nebulizer was also sent in  We are also prescribing a spacer that will help you get the medication from your inhaler deep into your chest.  Continue efforts to quit smoking  We will have you follow-up in 2 to 3 weeks.  Call sooner should any new problems arise

## 2020-01-13 NOTE — Telephone Encounter (Signed)
Patient in office for OV. She would like for albuterol neb solution to be sent local pharmacy. She does not wish to go through mail order pharmacy for this medication.  Rx for albuterol has been sent to local pharmacy during visit.  Nothing further needed.

## 2020-01-13 NOTE — Telephone Encounter (Signed)
Spoke to [place] with [facility], who is requesting update on Rx faxed to our office for albuterol neb solution.  Andee Poles is aware that we have not received this. I have requested that she re fax it to our office. Will await fax.

## 2020-01-15 DIAGNOSIS — F17210 Nicotine dependence, cigarettes, uncomplicated: Secondary | ICD-10-CM | POA: Diagnosis not present

## 2020-01-15 DIAGNOSIS — I10 Essential (primary) hypertension: Secondary | ICD-10-CM | POA: Diagnosis not present

## 2020-01-15 DIAGNOSIS — J441 Chronic obstructive pulmonary disease with (acute) exacerbation: Secondary | ICD-10-CM | POA: Diagnosis not present

## 2020-01-15 DIAGNOSIS — F39 Unspecified mood [affective] disorder: Secondary | ICD-10-CM | POA: Diagnosis not present

## 2020-01-15 DIAGNOSIS — I493 Ventricular premature depolarization: Secondary | ICD-10-CM | POA: Diagnosis not present

## 2020-01-15 DIAGNOSIS — F410 Panic disorder [episodic paroxysmal anxiety] without agoraphobia: Secondary | ICD-10-CM | POA: Diagnosis not present

## 2020-01-15 DIAGNOSIS — F32A Depression, unspecified: Secondary | ICD-10-CM | POA: Diagnosis not present

## 2020-01-15 DIAGNOSIS — Z90710 Acquired absence of both cervix and uterus: Secondary | ICD-10-CM | POA: Diagnosis not present

## 2020-01-15 DIAGNOSIS — Z9181 History of falling: Secondary | ICD-10-CM | POA: Diagnosis not present

## 2020-01-15 DIAGNOSIS — J449 Chronic obstructive pulmonary disease, unspecified: Secondary | ICD-10-CM | POA: Diagnosis not present

## 2020-01-15 DIAGNOSIS — I2510 Atherosclerotic heart disease of native coronary artery without angina pectoris: Secondary | ICD-10-CM | POA: Diagnosis not present

## 2020-01-15 DIAGNOSIS — K219 Gastro-esophageal reflux disease without esophagitis: Secondary | ICD-10-CM | POA: Diagnosis not present

## 2020-01-15 DIAGNOSIS — M1990 Unspecified osteoarthritis, unspecified site: Secondary | ICD-10-CM | POA: Diagnosis not present

## 2020-01-15 DIAGNOSIS — K449 Diaphragmatic hernia without obstruction or gangrene: Secondary | ICD-10-CM | POA: Diagnosis not present

## 2020-01-15 DIAGNOSIS — Z9049 Acquired absence of other specified parts of digestive tract: Secondary | ICD-10-CM | POA: Diagnosis not present

## 2020-01-15 DIAGNOSIS — L930 Discoid lupus erythematosus: Secondary | ICD-10-CM | POA: Diagnosis not present

## 2020-01-15 DIAGNOSIS — J9601 Acute respiratory failure with hypoxia: Secondary | ICD-10-CM | POA: Diagnosis not present

## 2020-01-17 ENCOUNTER — Telehealth: Payer: Self-pay | Admitting: Pulmonary Disease

## 2020-01-17 ENCOUNTER — Telehealth: Payer: Self-pay | Admitting: Family Medicine

## 2020-01-17 ENCOUNTER — Encounter: Payer: Self-pay | Admitting: Family Medicine

## 2020-01-17 ENCOUNTER — Other Ambulatory Visit: Payer: Self-pay

## 2020-01-17 ENCOUNTER — Ambulatory Visit (INDEPENDENT_AMBULATORY_CARE_PROVIDER_SITE_OTHER): Payer: Medicare Other | Admitting: Family Medicine

## 2020-01-17 VITALS — BP 102/71 | HR 85 | Temp 98.1°F | Wt 155.0 lb

## 2020-01-17 DIAGNOSIS — Z720 Tobacco use: Secondary | ICD-10-CM

## 2020-01-17 DIAGNOSIS — J0110 Acute frontal sinusitis, unspecified: Secondary | ICD-10-CM | POA: Diagnosis not present

## 2020-01-17 DIAGNOSIS — J441 Chronic obstructive pulmonary disease with (acute) exacerbation: Secondary | ICD-10-CM | POA: Diagnosis not present

## 2020-01-17 DIAGNOSIS — J449 Chronic obstructive pulmonary disease, unspecified: Secondary | ICD-10-CM | POA: Diagnosis not present

## 2020-01-17 MED ORDER — AMOXICILLIN-POT CLAVULANATE 875-125 MG PO TABS: 1.0000 | ORAL_TABLET | Freq: Two times a day (BID) | ORAL | 0 refills | Status: DC

## 2020-01-17 NOTE — Telephone Encounter (Signed)
Patient is aware of below recommendations and voiced his understanding.  Bactrim has been listed as an allergy within patients chart. Nothing further needed.

## 2020-01-17 NOTE — Patient Instructions (Addendum)
.   Please review the attached list of medications and notify my office if there are any errors.   . Be sure to rinse your mouth thoroughly after using Breztri inhaler. If you have any sores in your mouth, rinse with a warm salt water solution.

## 2020-01-17 NOTE — Telephone Encounter (Signed)
That's fine

## 2020-01-17 NOTE — Telephone Encounter (Signed)
Verbal order given  

## 2020-01-17 NOTE — Telephone Encounter (Signed)
Home Health Verbal Orders - Caller/Agency: [facility]  Verbal: skilled nursing 1 week 1, 2 week 1, 1 week 3 and 3 prn  [phone]

## 2020-01-17 NOTE — Progress Notes (Signed)
Established patient visit   Patient: Andrea [name]   DOB: 1965-07-25   54 y.o. Female  MRN: [id number] Visit Date: 01/17/2020  Today's healthcare provider: Lelon Huh, MD   No chief complaint on file.  Subjective    HPI  Follow up Hospitalization  Patient was admitted to [facility] on 01/08/2020 and discharged on 01/10/2020. She was treated for COPD exacerbation and shortness of breath.  Per discharge summary patient was to follow up with PCP in 1-2 weeks, and follow-up with pulmonary Dr. Patsey Berthold.  Telephone follow up was done on 01/11/2020 She reports excellent compliance with treatment. Patient was seen by pulmonology on 01/13/2020. She reports this condition is improved. But not back to baseline She stopped prednisone yesterday due to having sores in mouth. She stopped septra which was prescribed for sinus infection due to itching which is now better.  She is using Breztry prescribed by Dr. Dorothe Pea at least twice a day She is using albuterol nebulizer every 5-6 hours, and albuterol HFA 2 to 3 times a day.  She is checking home O2 and has been running consistently between 93 and 96.   She is working on smoking cessation and has cut back to 2 cigarettes every day.  ----------------------------------------------------------------------------------------- -      Medications: Outpatient Medications Prior to Visit  Medication Sig  . albuterol (PROVENTIL) (2.5 MG/3ML) 0.083% nebulizer solution Take 3 mLs (2.5 mg total) by nebulization every 4 (four) hours as needed for wheezing or shortness of breath.  Marland Kitchen albuterol (VENTOLIN HFA) 108 (90 Base) MCG/ACT inhaler Inhale 2 puffs into the lungs every 6 (six) hours as needed for wheezing or shortness of breath.  . benzonatate (TESSALON) 200 MG capsule Take 1 capsule (200 mg total) by mouth 3 (three) times daily as needed.  . Budeson-Glycopyrrol-Formoterol (BREZTRI AEROSPHERE) 160-9-4.8 MCG/ACT AERO Inhale 2 puffs into the lungs in  the morning and at bedtime.  . clonazePAM (KLONOPIN) 1 MG tablet TAKE 1 TABLET (1 MG TOTAL) BY MOUTH 2 (TWO) TIMES DAILY AS NEEDED. (Patient taking differently: Take 1 mg by mouth 3 (three) times daily. )  . dexlansoprazole (DEXILANT) 60 MG capsule Take 1 capsule (60 mg total) by mouth daily. **PLEASE SCHEDULE 1 YEAR FOLLOW UP APPT**  . DUREZOL 0.05 % EMUL Place 1 drop into the left eye 5 (five) times daily as needed (takes only during flares of uveitis).   . hydroxychloroquine (PLAQUENIL) 200 MG tablet TAKE 2 TABLETS (400 MG TOTAL) BY MOUTH DAILY. FOR DISCOID LUPUS (L93.0)  . ipratropium-albuterol (DUONEB) 0.5-2.5 (3) MG/3ML SOLN Take 3 mLs by nebulization every 6 (six) hours as needed.  . Misc. Devices (PULSE OXIMETER DELUXE) MISC Use daily to monitor oxygen levels  . nitroGLYCERIN (NITROSTAT) 0.4 MG SL tablet Place 1 tablet (0.4 mg total) under the tongue every 5 (five) minutes as needed for chest pain.  Marland Kitchen propranolol (INDERAL) 20 MG tablet TAKE 1 TABLET (20 MG TOTAL) BY MOUTH 2 (TWO) TIMES DAILY. (BETA BLOCKER)  . Respiratory Therapy Supplies (FLUTTER) DEVI 1 Device by Does not apply route daily.  Marland Kitchen Spacer/Aero-Holding Chambers (AEROCHAMBER MV) inhaler Use as instructed  . ALPRAZolam (XANAX) 1 MG tablet TAKE 1 TABLET BY MOUTH EVERY 8 HOURS AS NEEDED (Patient not taking: Reported on 01/12/2020)  . ARIPiprazole (ABILIFY) 5 MG tablet Take 5 mg by mouth daily as needed (anxiety and depression).  (Patient not taking: Reported on 01/13/2020)  . nicotine (NICODERM CQ - DOSED IN MG/24 HOURS) 21 mg/24hr patch Place  1 patch (21 mg total) onto the skin daily. (Patient not taking: Reported on 01/12/2020)  . predniSONE (STERAPRED UNI-PAK 21 TAB) 10 MG (21) TBPK tablet As directed in package  . sulfamethoxazole-trimethoprim (BACTRIM) 400-80 MG tablet Take 1 tablet by mouth 2 (two) times daily for 7 days.  . [DISCONTINUED] azithromycin (ZITHROMAX) 250 MG tablet Take 2 tablets PO on day one, and one tablet PO  daily thereafter until completed. (Patient not taking: Reported on 01/12/2020)   No facility-administered medications prior to visit.    Review of Systems  Constitutional: Positive for fatigue. Negative for activity change, appetite change and chills.  HENT: Positive for congestion, mouth sores, sinus pressure, sinus pain, sore throat and tinnitus. Negative for ear discharge and ear pain.   Eyes: Negative.   Respiratory: Positive for cough, shortness of breath and wheezing. Negative for apnea, choking, chest tightness and stridor.   Cardiovascular: Negative.   Gastrointestinal: Negative.   Neurological: Negative for dizziness, light-headedness and headaches.      Objective    BP 102/71 (BP Location: Left Arm, Patient Position: Sitting, Cuff Size: Normal)   Pulse 85   Temp 98.1 F (36.7 C) (Oral)   Wt 155 lb (70.3 kg)   SpO2 97%   BMI 27.46 kg/m    Physical Exam   General: Appearance:     Well developed, well nourished female in no acute distress  Eyes:    PERRL, conjunctiva/corneas clear, EOM's intact       ENT:  Tender over frontal and maxillary sinuses. + yellow post nasal drainage.   Lungs:     Clear to auscultation bilaterally, respirations unlabored  Heart:    Normal heart rate. Normal rhythm. No murmurs, rubs, or gallops.   MS:   All extremities are intact.   Neurologic:   Awake, alert, oriented x 3. No apparent focal neurological           defect.         No results found for any visits on 01/17/20.  Assessment & Plan     1. Acute non-recurrent frontal sinusitis Stopped sulfa antibiotic due to itching, change to - amoxicillin-clavulanate (AUGMENTIN) 875-125 MG tablet; Take 1 tablet by mouth 2 (two) times daily.  Dispense: 20 tablet; Refill: 0  2. COPD exacerbation ([place]) Slowly improving, off of steroids. Has follow up Dr. Duwayne Heck in a couple of weeks. Continue Breztri and continue albuterol nebs and HFA prn.   3. COPD, GOLD B   4. Tobacco abuse Down to 2  cigs a day intended to completely quit cigarettes in the coming weeks.        The entirety of the information documented in the History of Present Illness, Review of Systems and Physical Exam were personally obtained by me. Portions of this information were initially documented by the CMA and reviewed by me for thoroughness and accuracy.      Lelon Huh, MD  [facility] [phone] (phone) [phone] (fax)  [place]

## 2020-01-17 NOTE — Telephone Encounter (Signed)
Called and spoke to patient, who stated one day after starting starting Bactrim, she developed itching all over her body including her throat.  She denied rash, throat swelling or additional sx.  Advised patient to hold morning dose of Bactrim. Last taken last night.   Dr. Patsey Berthold, please advise. Thanks

## 2020-01-17 NOTE — Telephone Encounter (Signed)
Stop Bactrim, this will need to be labeled as an allergy. Continue prednisone. Benadryl as needed for itching.

## 2020-01-18 DIAGNOSIS — I10 Essential (primary) hypertension: Secondary | ICD-10-CM | POA: Diagnosis not present

## 2020-01-18 DIAGNOSIS — M1990 Unspecified osteoarthritis, unspecified site: Secondary | ICD-10-CM | POA: Diagnosis not present

## 2020-01-18 DIAGNOSIS — J9601 Acute respiratory failure with hypoxia: Secondary | ICD-10-CM | POA: Diagnosis not present

## 2020-01-18 DIAGNOSIS — J441 Chronic obstructive pulmonary disease with (acute) exacerbation: Secondary | ICD-10-CM | POA: Diagnosis not present

## 2020-01-18 DIAGNOSIS — I2510 Atherosclerotic heart disease of native coronary artery without angina pectoris: Secondary | ICD-10-CM | POA: Diagnosis not present

## 2020-01-18 DIAGNOSIS — L930 Discoid lupus erythematosus: Secondary | ICD-10-CM | POA: Diagnosis not present

## 2020-01-20 DIAGNOSIS — I10 Essential (primary) hypertension: Secondary | ICD-10-CM | POA: Diagnosis not present

## 2020-01-20 DIAGNOSIS — M1990 Unspecified osteoarthritis, unspecified site: Secondary | ICD-10-CM | POA: Diagnosis not present

## 2020-01-20 DIAGNOSIS — J9601 Acute respiratory failure with hypoxia: Secondary | ICD-10-CM | POA: Diagnosis not present

## 2020-01-20 DIAGNOSIS — I2510 Atherosclerotic heart disease of native coronary artery without angina pectoris: Secondary | ICD-10-CM | POA: Diagnosis not present

## 2020-01-20 DIAGNOSIS — L930 Discoid lupus erythematosus: Secondary | ICD-10-CM | POA: Diagnosis not present

## 2020-01-20 DIAGNOSIS — J441 Chronic obstructive pulmonary disease with (acute) exacerbation: Secondary | ICD-10-CM | POA: Diagnosis not present

## 2020-02-02 ENCOUNTER — Inpatient Hospital Stay: Admission: RE | Admit: 2020-02-02 | Payer: Medicare Other | Source: Ambulatory Visit

## 2020-02-02 ENCOUNTER — Telehealth: Payer: Medicare Other | Admitting: Oncology

## 2020-02-07 ENCOUNTER — Ambulatory Visit: Payer: Medicare Other | Admitting: Adult Health

## 2020-02-08 ENCOUNTER — Other Ambulatory Visit: Payer: Self-pay | Admitting: Gastroenterology

## 2020-02-10 NOTE — Telephone Encounter (Signed)
Patient wanting a refill of medication dexilant sent to [facility] pharmacy. Pt last seen 4.28.2020.

## 2020-03-07 DIAGNOSIS — Z20828 Contact with and (suspected) exposure to other viral communicable diseases: Secondary | ICD-10-CM | POA: Diagnosis not present

## 2020-03-08 ENCOUNTER — Telehealth: Payer: Self-pay | Admitting: Pulmonary Disease

## 2020-03-08 NOTE — Telephone Encounter (Signed)
Please refer to 03/09/2019 phone note.

## 2020-03-08 NOTE — Telephone Encounter (Signed)
The antibody test shows that she has protection.  I cannot tell whether this is due to the vaccine or whether she has had the virus as they will both increase the antibody count.  Given that her antibodies are high I do not recommend in this situation that she take a booster.  She should have the antibodies checked in another 3 to 4 months and see if they are declining prior to boosting.

## 2020-03-08 NOTE — Telephone Encounter (Signed)
Spoke to patient, who stated that her covid antibodies are 2,290. She is questioning if this is related to covid vaccines from September or if this indicates that she has had covid.  She is also questioning if she should receive covid booster. She stated that she became very ill after second covid shot and was hospitalize.  Dr. Jayme Cloud, please advise. Thanks.

## 2020-03-08 NOTE — Telephone Encounter (Signed)
Patient is aware of below message. She voiced her understanding and had no further questions.  Nothing further needed.   

## 2020-03-25 ENCOUNTER — Other Ambulatory Visit: Payer: Self-pay | Admitting: Family Medicine

## 2020-03-25 NOTE — Telephone Encounter (Signed)
Requested medication (s) are due for refill today:yes  Requested medication (s) are on the active medication list: yes  Last refill: 11/24/19  #60  3 refills  Future visit scheduled yes  04/18/20  Notes to clinic: not delegated  Requested Prescriptions  Pending Prescriptions Disp Refills   clonazePAM (KLONOPIN) 1 MG tablet [Pharmacy Med Name: CLONAZEPAM 1 MG TABLET] 60 tablet 3    Sig: TAKE 1 TABLET (1 MG TOTAL) BY MOUTH 2 (TWO) TIMES DAILY AS NEEDED.      Not Delegated - Psychiatry:  Anxiolytics/Hypnotics Failed - 03/25/2020  4:58 PM      Failed - This refill cannot be delegated      Failed - Urine Drug Screen completed in last 360 days      Passed - Valid encounter within last 6 months    Recent Outpatient Visits           2 months ago Acute non-recurrent frontal sinusitis   [facility] Birdie Sons, MD   2 months ago Chronic obstructive pulmonary disease with acute exacerbation [facility])   [facility] [place], [place]   4 months ago Palpitations   [facility] Birdie Sons, MD   9 months ago [place], Donald E, MD   1 year ago Acute recurrent pansinusitis   [place], [facility] [place], [place]       Future Appointments             In 3 weeks Caryn Section, Kirstie Peri, MD [facility], [place]   In 1 month Fisher, Kirstie Peri, MD [facility], [place]

## 2020-03-27 ENCOUNTER — Telehealth: Payer: Self-pay | Admitting: Pulmonary Disease

## 2020-03-27 ENCOUNTER — Ambulatory Visit: Payer: Self-pay

## 2020-03-27 NOTE — Telephone Encounter (Signed)
Pt with h/o COPD called for advice what to look for since her daughter is + covid. Pt is not having any sx. Advised pt to wear a mask for 10 day and to test for covid on day 5. Pt advised to watcher her breathing. Advised to call her PCP if: becomes SOB with activity, if coughing phlegm with any color to it (yellow, green or brown).  Advised to drink fluids and warm fluids.Pt advised for any changes with her breathing to call PCP and if having respiratory distress to call 911. Advised pt to call pulmonologist to ask if there are any further precautions.  Advised that 10 days after sx occur are when pt's are infectious. Pt's daughter is isolated with her own room and bathroom. Advised to clean and disinfect hard commonly touched areas in the house.  Pt verbalized understanding.           Reason for Disposition . Health Information question, no triage required and triager able to answer question  Answer Assessment - Initial Assessment Questions 1. REASON FOR CALL or QUESTION: "What is your reason for calling today?" or "How can I best help you?" or "What question do you have that I can help answer?"      Message from Scherrie Gerlach sent at 03/27/2020 10:19 AM EST  Pt states her daughter lives with her and tested positive. She wants to know what she needs to do to keep from getting it. How long does she need to worry?  Protocols used: INFORMATION ONLY CALL - NO TRIAGE-A-AH

## 2020-03-27 NOTE — Telephone Encounter (Signed)
Spoke to patient via telephone.  Patient stated that her daughter whom lives with her tested positive for covid on 03/25/2020. Patient is questioning what precautions she can take to avoid catching it.  She does not currently have symptoms.  Advised patient to self-isolate, wear mask and get tested per covid call protocol.  Patient has been provided with test site info. She voiced her understanding and had no further questions.  Nothing further needed.

## 2020-03-30 ENCOUNTER — Encounter: Payer: Self-pay | Admitting: Family Medicine

## 2020-03-30 ENCOUNTER — Telehealth: Payer: Self-pay | Admitting: Pulmonary Disease

## 2020-03-30 ENCOUNTER — Other Ambulatory Visit: Payer: Medicare Other

## 2020-03-30 DIAGNOSIS — Z20822 Contact with and (suspected) exposure to covid-19: Secondary | ICD-10-CM | POA: Diagnosis not present

## 2020-03-30 DIAGNOSIS — J441 Chronic obstructive pulmonary disease with (acute) exacerbation: Secondary | ICD-10-CM

## 2020-03-30 DIAGNOSIS — J0110 Acute frontal sinusitis, unspecified: Secondary | ICD-10-CM

## 2020-03-30 NOTE — Telephone Encounter (Signed)
Agree with assessment below.

## 2020-03-30 NOTE — Telephone Encounter (Signed)
Please refer to 03/27/2020 phone note.    Patient reports of headache, nausea, non prod cough and chest congestion. Sx developed this morning.  Sob is baseline.   Due to patient living with someone whom is currently positive, I have recommended that she isolate and get a PCR test despite home test being negative this morning.patient has been provided with testing site info.    Advised patient to go to ED if spo2 is below 88% or if she is SOB after using albuterol and Breztri. Patient will call back if test is positive.  Nothing further needed at this time.   Will route to Dr. Patsey Berthold as an [place].

## 2020-04-01 ENCOUNTER — Telehealth: Payer: Medicare Other | Admitting: Oncology

## 2020-04-01 DIAGNOSIS — U071 COVID-19: Secondary | ICD-10-CM | POA: Diagnosis not present

## 2020-04-01 MED ORDER — PREDNISONE 50 MG PO TABS: ORAL_TABLET | ORAL | 0 refills | Status: DC

## 2020-04-01 MED ORDER — HYDROCOD POLST-CPM POLST ER 10-8 MG/5ML PO SUER: 5.0000 mL | Freq: Two times a day (BID) | ORAL | 0 refills | Status: DC

## 2020-04-01 MED ORDER — AZITHROMYCIN 250 MG PO TABS: ORAL_TABLET | ORAL | 0 refills | Status: DC

## 2020-04-01 NOTE — Progress Notes (Signed)
Ms. Andrea, [name] are scheduled for a virtual visit with your provider today.    Just as we do with appointments in the office, we must obtain your consent to participate.  Your consent will be active for this visit and any virtual visit you may have with one of our providers in the next 365 days.    If you have a MyChart account, I can also send a copy of this consent to you electronically.  All virtual visits are billed to your insurance company just like a traditional visit in the office.  As this is a virtual visit, video technology does not allow for your provider to perform a traditional examination.  This may limit your provider's ability to fully assess your condition.  If your provider identifies any concerns that need to be evaluated in person or the need to arrange testing such as labs, EKG, etc, we will make arrangements to do so.    Although advances in technology are sophisticated, we cannot ensure that it will always work on either your end or our end.  If the connection with a video visit is poor, we may have to switch to a telephone visit.  With either a video or telephone visit, we are not always able to ensure that we have a secure connection.   I need to obtain your verbal consent now.   Are you willing to proceed with your visit today?   Andrea [name] has provided verbal consent on 04/01/2020 for a virtual visit (video or telephone).   Jacquelin Hawking, NP 04/01/2020  5:25 PM  Virtual Visit via Video Note  I connected with Andrea [name] on 04/01/20 at  5:30 PM EST by a video enabled telemedicine application and verified that I am speaking with the correct person using two identifiers.  Location: Patient: Home Provider: Home office   I discussed the limitations of evaluation and management by telemedicine and the availability of in person appointments. The patient expressed understanding and agreed to proceed.  History of Present Illness: Andrea [name] is a 55 year old  female with past medical history significant for hypertension, COPD, GERD, shortness of breath, anxiety, depression, insomnia, panic disorder and positive for lupus who presents for on demand visit today for signs and symptoms of COVID-19 x 4 days.  Her daughter tested positive last week and she started to have symptoms this past Wednesday.  Symptoms include cough, sinus tenderness, fever and chills x2 days and headache.  She has both COVID-19 vaccinations but no booster.  She has been taking Mucinex, Tylenol and using her inhalers and nebulizer treatments 4 times per day.  She has a pulse oximeter and oxygen saturations have been between 88 and 94%.  Normal oxygen readings are around 96%.  She tested with a home test for COVID-19 which was negative.  She is currently waiting on PCR test results.  Observations/Objective: Review of Systems  Constitutional: Positive for chills, fever and malaise/fatigue. Negative for weight loss.  HENT: Positive for sinus pain. Negative for congestion, ear pain and tinnitus.   Eyes: Negative.  Negative for blurred vision and double vision.  Respiratory: Positive for cough and shortness of breath. Negative for sputum production.   Cardiovascular: Negative.  Negative for chest pain, palpitations and leg swelling.  Gastrointestinal: Negative.  Negative for abdominal pain, constipation, diarrhea, nausea and vomiting.  Genitourinary: Negative for dysuria, frequency and urgency.  Musculoskeletal: Negative for back pain and falls.  Skin: Negative.  Negative for rash.  Neurological:  Positive for headaches. Negative for weakness.  Endo/Heme/Allergies: Negative.  Does not bruise/bleed easily.  Psychiatric/Behavioral: Negative for depression. The patient has insomnia. The patient is not nervous/anxious.    Physical Exam Constitutional:      Appearance: Normal appearance.  Pulmonary:     Effort: No tachypnea or respiratory distress.  Neurological:     Mental Status: She is  alert.   Assessment and Plan:  Presumed COVID-19: -Symptoms x4 days (cough, fever, sinus congestion, shortness of breath) -Awaiting PCR test.  Negative home test. -Recommend symptomatic treatment with Mucinex, Tylenol, albuterol inhaler and nebulizer as needed for shortness of breath. -Prescription sent for Tussionex for cough, Z-Pak x5 days and prednisone 50 mg x 5 days. -We spoke about COVID-19 quarantine guidelines. -Red flag symptoms discussed in detail.  She will present to the emergency room if symptoms worsen.  Follow Up Instructions: Patient has follow-up with PCP.  She know to call clinic with questions/concerns and to go directly to ED for worsening symptoms.    I discussed the assessment and treatment plan with the patient. The patient was provided an opportunity to ask questions and all were answered. The patient agreed with the plan and demonstrated an understanding of the instructions.   The patient was advised to call back or seek an in-person evaluation if the symptoms worsen or if the condition fails to improve as anticipated.  I provided 10 minutes of face-to-face time during this encounter.   Jacquelin Hawking, NP

## 2020-04-02 ENCOUNTER — Emergency Department
Admission: EM | Admit: 2020-04-02 | Discharge: 2020-04-02 | Disposition: A | Discharge status: Home / Self Care | Payer: Medicare Other | Attending: Emergency Medicine | Admitting: Emergency Medicine

## 2020-04-02 ENCOUNTER — Other Ambulatory Visit: Payer: Self-pay

## 2020-04-02 ENCOUNTER — Encounter: Payer: Self-pay | Admitting: Emergency Medicine

## 2020-04-02 ENCOUNTER — Emergency Department: Payer: Medicare Other

## 2020-04-02 DIAGNOSIS — U099 Post covid-19 condition, unspecified: Secondary | ICD-10-CM | POA: Diagnosis not present

## 2020-04-02 DIAGNOSIS — Z716 Tobacco abuse counseling: Secondary | ICD-10-CM | POA: Diagnosis not present

## 2020-04-02 DIAGNOSIS — J441 Chronic obstructive pulmonary disease with (acute) exacerbation: Secondary | ICD-10-CM | POA: Insufficient documentation

## 2020-04-02 DIAGNOSIS — R0602 Shortness of breath: Secondary | ICD-10-CM | POA: Diagnosis not present

## 2020-04-02 DIAGNOSIS — Z79899 Other long term (current) drug therapy: Secondary | ICD-10-CM | POA: Diagnosis not present

## 2020-04-02 DIAGNOSIS — J9811 Atelectasis: Secondary | ICD-10-CM | POA: Diagnosis not present

## 2020-04-02 DIAGNOSIS — I10 Essential (primary) hypertension: Secondary | ICD-10-CM | POA: Diagnosis not present

## 2020-04-02 DIAGNOSIS — F17220 Nicotine dependence, chewing tobacco, uncomplicated: Secondary | ICD-10-CM | POA: Diagnosis not present

## 2020-04-02 DIAGNOSIS — U071 COVID-19: Secondary | ICD-10-CM | POA: Insufficient documentation

## 2020-04-02 DIAGNOSIS — J449 Chronic obstructive pulmonary disease, unspecified: Secondary | ICD-10-CM | POA: Diagnosis not present

## 2020-04-02 DIAGNOSIS — F17210 Nicotine dependence, cigarettes, uncomplicated: Secondary | ICD-10-CM | POA: Insufficient documentation

## 2020-04-02 DIAGNOSIS — J439 Emphysema, unspecified: Secondary | ICD-10-CM | POA: Diagnosis not present

## 2020-04-02 LAB — COMPREHENSIVE METABOLIC PANEL
ALT: 19 U/L (ref 0–44)
AST: 19 U/L (ref 15–41)
Albumin: 3.8 g/dL (ref 3.5–5.0)
Alkaline Phosphatase: 83 U/L (ref 38–126)
Anion gap: 11 (ref 5–15)
BUN: 13 mg/dL (ref 6–20)
CO2: 28 mmol/L (ref 22–32)
Calcium: 9.2 mg/dL (ref 8.9–10.3)
Chloride: 100 mmol/L (ref 98–111)
Creatinine, Ser: 0.72 mg/dL (ref 0.44–1.00)
GFR, Estimated: >60 mL/min (ref >60)
Glucose, Bld: 91 mg/dL (ref 70–99)
Potassium: 4.1 mmol/L (ref 3.5–5.1)
Sodium: 139 mmol/L (ref 135–145)
Total Bilirubin: 0.5 mg/dL (ref 0.3–1.2)
Total Protein: 7.5 g/dL (ref 6.5–8.1)

## 2020-04-02 LAB — CBC WITH DIFFERENTIAL/PLATELET
Abs Immature Granulocytes: 0.01 10*3/uL (ref 0.00–0.07)
Basophils Absolute: 0.1 10*3/uL (ref 0.0–0.1)
Basophils Relative: 1 %
Eosinophils Absolute: 0.2 10*3/uL (ref 0.0–0.5)
Eosinophils Relative: 3 %
HCT: 44.2 % (ref 36.0–46.0)
Hemoglobin: 14.6 g/dL (ref 12.0–15.0)
Immature Granulocytes: 0 %
Lymphocytes Relative: 39 %
Lymphs Abs: 2.1 10*3/uL (ref 0.7–4.0)
MCH: 26.8 pg (ref 26.0–34.0)
MCHC: 33 g/dL (ref 30.0–36.0)
MCV: 81.1 fL (ref 80.0–100.0)
Monocytes Absolute: 0.7 10*3/uL (ref 0.1–1.0)
Monocytes Relative: 13 %
Neutro Abs: 2.4 10*3/uL (ref 1.7–7.7)
Neutrophils Relative %: 44 %
Platelets: 229 10*3/uL (ref 150–400)
RBC: 5.45 MIL/uL — ABNORMAL HIGH (ref 3.87–5.11)
RDW: 15.2 % (ref 11.5–15.5)
WBC: 5.4 10*3/uL (ref 4.0–10.5)
nRBC: 0 % (ref 0.0–0.2)

## 2020-04-02 LAB — TROPONIN I (HIGH SENSITIVITY): Troponin I (High Sensitivity): 3 ng/L (ref <18)

## 2020-04-02 LAB — NOVEL CORONAVIRUS, NAA: SARS-CoV-2, NAA: DETECTED — AB

## 2020-04-02 LAB — SARS-COV-2, NAA 2 DAY TAT

## 2020-04-02 LAB — PROCALCITONIN: Procalcitonin: <0.1 ng/mL

## 2020-04-02 LAB — FIBRIN DERIVATIVES D-DIMER (ARMC ONLY): Fibrin derivatives D-dimer (ARMC): 534.86 ng/mL (FEU) — ABNORMAL HIGH (ref 0.00–499.00)

## 2020-04-02 MED ORDER — PREDNISONE 20 MG PO TABS
50.0000 mg | ORAL_TABLET | Freq: Once | ORAL | Status: AC
  Administered 2020-04-02: 50 mg via ORAL
  Filled 2020-04-02: qty 2

## 2020-04-02 MED ORDER — PREDNISONE 10 MG PO TABS: 40.0000 mg | ORAL_TABLET | Freq: Every day | ORAL | 0 refills | Status: AC

## 2020-04-02 NOTE — ED Notes (Addendum)
Pt to ED c/o SOB, coughing and weakness since 4d ago. States when coughs, her SPO2 goes down to as as low as 81% with home SPO2 monitor. Pt has uveitis in L eye. Has lupus, COPD, asthma. Pt currently smokes 1 cigarette/day with 15 year 1/2 pack/day history. Denies NVD at this time. EDP at bedside examining pt. Pt instructed to march at bedside for 30sec; SPO2 remained 99-100% on RA. Pt states she "can't have IV contrast dye" because she feels like "heart is swelling" and that she gets hives when eats fish.

## 2020-04-02 NOTE — ED Provider Notes (Signed)
[facility] Emergency Department Provider Note  ____________________________________________   Event Date/Time   First MD Initiated Contact with Patient 04/02/20 1228     (approximate)  I have reviewed the triage vital signs and the nursing notes.   HISTORY  Chief Complaint Covid Positive and Shortness of Breath   HPI Laylee L Maish is a 55 y.o. female with past medical history of COPD, ongoing tobacco abuse, arthritis, lupus, anxiety, uveitis, HTN, and CAD as well as recent diagnosis of COVID-19 approximately 5 days ago presents for assessment approximately 5 days of cough, congestion, headaches, chills, myalgias, and fatigue.  Patient states she initially had some diarrhea but this is since resolved.  She denies any acute headache, earache, sore throat but does endorse a little bit of chest discomfort.  She denies any abdominal pain, back pain, focal extremity pain or weakness, urinary symptoms, diarrhea in the last 48 hours, rash, or recent injuries or falls.  She denies regular EtOH use, illicit drug use or other significant past medical history.  Patient states she has been taking her COPD breathing treatments at home which has only helped a little bit and not significantly improved for her cough shortness of breath or congestion.         Past Medical History:  Diagnosis Date  . Anxiety    panic attacks, chest pain  . Arthritis   . COPD (chronic obstructive pulmonary disease) ([place])   . GERD (gastroesophageal reflux disease)   . Hypertension    not medicated  . Non-obstructive CAD in native artery    a. cardiac cath 01/2014: showed minor irregularities, mildly elevated left ventricular end-diastolic pressure and normal ejection fraction; b. 03/2015 MV: low risk.  . Palpitations    a. 08/2016 Event Monitor: occas PVC's, two brief runs of SVT (4 and 7 beats).  Marland Kitchen PONV (postoperative nausea and vomiting)   . Symptomatic PVC's (premature ventricular  contractions)   . Tobacco abuse   . Wears dentures    partial upper and lower    Patient Active Problem List   Diagnosis Date Noted  . Personal history of colonic polyps   . Polyp of transverse colon   . Varicose veins of bilateral lower extremities with pain 11/22/2016  . Restless leg 11/18/2016  . Iron deficiency 11/18/2016  . Allergic rhinitis 06/11/2016  . Hyperlipidemia 03/19/2015  . COPD exacerbation ([place]) 10/04/2014  . Anxiety 09/22/2014  . Depression 09/22/2014  . Dyshidrosis 09/22/2014  . GERD (gastroesophageal reflux disease) 09/22/2014  . Insomnia 09/22/2014  . Lung nodule, multiple 09/22/2014  . Panic disorder 09/22/2014  . Palpitations 01/17/2014  . Pulmonary nodule 06/10/2013  . Shortness of breath 05/20/2013  . COPD, GOLD B 05/20/2013  . Tobacco abuse 05/20/2013  . Daytime somnolence 05/20/2013  . Back pain 08/28/2012  . Discoid lupus 05/30/2012  . Generalized abdominal pain 03/11/2012  . Pain in joint 03/09/2012  . Psoriasis 02/21/2012  . Fibromyalgia 02/11/2012  . Lupus ([place]) 02/11/2012  . Panic attacks 02/11/2012  . History of adenomatous polyp of colon 09/13/2011  . Heartburn 08/06/2011  . PVC's (premature ventricular contractions) 01/29/2011  . Essential (primary) hypertension 03/04/1998    Past Surgical History:  Procedure Laterality Date  . CARDIAC CATHETERIZATION  01/03/14  . CARDIAC CATHETERIZATION    . CHOLECYSTECTOMY    . COLONOSCOPY WITH PROPOFOL N/A 10/05/2019   Procedure: COLONOSCOPY WITH PROPOFOL;  Surgeon: Lucilla Lame, MD;  Location: [facility] ENDOSCOPY;  Service: Endoscopy;  Laterality: N/A;  .  ESOPHAGOGASTRODUODENOSCOPY (EGD) WITH PROPOFOL N/A 07/11/2016   Procedure: ESOPHAGOGASTRODUODENOSCOPY (EGD) WITH PROPOFOL;  Surgeon: Lucilla Lame, MD;  Location: [place];  Service: Endoscopy;  Laterality: N/A;  . ESOPHAGOGASTRODUODENOSCOPY (EGD) WITH PROPOFOL N/A 10/05/2019   Procedure: ESOPHAGOGASTRODUODENOSCOPY (EGD) WITH PROPOFOL;   Surgeon: Lucilla Lame, MD;  Location: [facility] ENDOSCOPY;  Service: Endoscopy;  Laterality: N/A;  . EYE SURGERY    . IMAGE GUIDED SINUS SURGERY N/A 11/30/2014   Procedure: IMAGE GUIDED SINUS SURGERY;  Surgeon: Carloyn Manner, MD;  Location: [place];  Service: ENT;  Laterality: N/A;  GAVE DISK TO CE CE  . MAXILLARY ANTROSTOMY Bilateral 11/30/2014   Procedure: MAXILLARY ANTROSTOMY;  Surgeon: Carloyn Manner, MD;  Location: [place];  Service: ENT;  Laterality: Bilateral;  . SEPTOPLASTY N/A 11/30/2014   Procedure: SEPTOPLASTY;  Surgeon: Carloyn Manner, MD;  Location: [place];  Service: ENT;  Laterality: N/A;  . SPHENOIDECTOMY Left 11/30/2014   Procedure: Coralee Pesa, ;  Surgeon: Carloyn Manner, MD;  Location: [place];  Service: ENT;  Laterality: Left;  Marland Kitchen VAGINAL HYSTERECTOMY  1995   vaginal    Prior to Admission medications   Medication Sig Start Date End Date Taking? Authorizing Provider  predniSONE (DELTASONE) 10 MG tablet Take 4 tablets (40 mg total) by mouth daily for 5 days. 04/02/20 04/07/20 Yes Lucrezia Starch, MD  albuterol (PROVENTIL) (2.5 MG/3ML) 0.083% nebulizer solution Take 3 mLs (2.5 mg total) by nebulization every 4 (four) hours as needed for wheezing or shortness of breath. 01/13/20 01/12/21  Tyler Pita, MD  albuterol (VENTOLIN HFA) 108 (90 Base) MCG/ACT inhaler Inhale 2 puffs into the lungs every 6 (six) hours as needed for wheezing or shortness of breath. 05/13/19   Tyler Pita, MD  amoxicillin-clavulanate (AUGMENTIN) 875-125 MG tablet Take 1 tablet by mouth 2 (two) times daily. 01/17/20   Birdie Sons, MD  azithromycin (ZITHROMAX) 250 MG tablet Take 2 tabs (500 mg) on day 1 and 1 tab (250mg ) on days 2-5. 04/01/20   Jacquelin Hawking, NP  benzonatate (TESSALON) 200 MG capsule Take 1 capsule (200 mg total) by mouth 3 (three) times daily as needed. 01/07/20   Mar Daring, PA-C  Budeson-Glycopyrrol-Formoterol  (BREZTRI AEROSPHERE) 160-9-4.8 MCG/ACT AERO Inhale 2 puffs into the lungs in the morning and at bedtime. 05/13/19   Tyler Pita, MD  chlorpheniramine-HYDROcodone [facility] PENNKINETIC ER) 10-8 MG/5ML SUER Take 5 mLs by mouth 2 (two) times daily. 04/01/20   Jacquelin Hawking, NP  clonazePAM (KLONOPIN) 1 MG tablet TAKE 1 TABLET (1 MG TOTAL) BY MOUTH 2 (TWO) TIMES DAILY AS NEEDED. 03/26/20   Birdie Sons, MD  DEXILANT 60 MG capsule TAKE 1 CAPSULE (60 MG TOTAL) BY MOUTH DAILY. **PLEASE SCHEDULE 1 YEAR FOLLOW UP APPT** 02/11/20   Lucilla Lame, MD  DUREZOL 0.05 % EMUL Place 1 drop into the left eye 5 (five) times daily as needed (takes only during flares of uveitis).  11/09/19   [provider]  hydroxychloroquine (PLAQUENIL) 200 MG tablet TAKE 2 TABLETS (400 MG TOTAL) BY MOUTH DAILY. FOR DISCOID LUPUS (L93.0) 09/29/19   Birdie Sons, MD  ipratropium-albuterol (DUONEB) 0.5-2.5 (3) MG/3ML SOLN Take 3 mLs by nebulization every 6 (six) hours as needed. 01/10/20 02/09/20  Sidney Ace, MD  Misc. Devices (PULSE OXIMETER DELUXE) MISC Use daily to monitor oxygen levels 01/07/20   Fenton Malling M, PA-C  nitroGLYCERIN (NITROSTAT) 0.4 MG SL tablet Place 1 tablet (0.4 mg total) under  the tongue every 5 (five) minutes as needed for chest pain. 12/08/14   Wellington Hampshire, MD  propranolol (INDERAL) 20 MG tablet TAKE 1 TABLET (20 MG TOTAL) BY MOUTH 2 (TWO) TIMES DAILY. (BETA BLOCKER) 01/13/20   Fisher, Kirstie Peri, MD  Respiratory Therapy Supplies (FLUTTER) DEVI 1 Device by Does not apply route daily. 03/19/15   Theodoro Grist, MD  Spacer/Aero-Holding Chambers (AEROCHAMBER MV) inhaler Use as instructed 01/13/20   Tyler Pita, MD    Allergies Alum & mag hydroxide-simeth, Aspirin, Bactrim [sulfamethoxazole-trimethoprim], Cefdinir, Chantix [varenicline], Doxycycline, Multaq [dronedarone], and Ivp dye [iodinated diagnostic agents]  Family History  Problem Relation Age of Onset  . Emphysema  Father   . Asthma Father   . Heart disease Father   . Heart attack Father   . Arthritis Father        RA  . Colon cancer Father   . Hypertension Mother   . Breast cancer Neg Hx     Social History Social History   Tobacco Use  . Smoking status: Current Every Day Smoker    Packs/day: 1.00    Years: 20.00    Pack years: 20.00    Types: Cigarettes  . Smokeless tobacco: Never Used  . Tobacco comment: 1 cig today/ 01/13/2020  Vaping Use  . Vaping Use: Never used  Substance Use Topics  . Alcohol use: No    Alcohol/week: 0.0 standard drinks  . Drug use: No    Review of Systems  Review of Systems  Constitutional: Positive for chills, fever and malaise/fatigue.  HENT: Negative for sore throat.   Eyes: Negative for pain.  Respiratory: Positive for cough, sputum production and shortness of breath. Negative for stridor.   Cardiovascular: Positive for chest pain.  Gastrointestinal: Negative for vomiting.  Genitourinary: Negative for dysuria.  Musculoskeletal: Negative for myalgias.  Skin: Negative for rash.  Neurological: Negative for speech change, focal weakness, seizures and loss of consciousness.  Psychiatric/Behavioral: Negative for suicidal ideas.  All other systems reviewed and are negative.     ____________________________________________   PHYSICAL EXAM:  VITAL SIGNS: ED Triage Vitals  Enc Vitals Group     BP 04/02/20 1051 121/67     Pulse Rate 04/02/20 1051 81     Resp 04/02/20 1051 16     Temp 04/02/20 1051 98.6 F (37 C)     Temp Source 04/02/20 1051 Oral     SpO2 04/02/20 1051 94 %     Weight 04/02/20 1052 160 lb (72.6 kg)     Height 04/02/20 1052 5\' 2"  (1.575 m)     Head Circumference --      Peak Flow --      Pain Score 04/02/20 1052 6     Pain Loc --      Pain Edu? --      Excl. in [place]? --    Vitals:   04/02/20 1051 04/02/20 1244  BP: 121/67 (!) 107/56  Pulse: 81 85  Resp: 16 15  Temp: 98.6 F (37 C)   SpO2: 94% 100%   Physical  Exam Vitals and nursing note reviewed.  Constitutional:      General: She is not in acute distress.    Appearance: She is well-developed and well-nourished.  HENT:     Head: Normocephalic and atraumatic.  Eyes:     Conjunctiva/sclera: Conjunctivae normal.  Cardiovascular:     Rate and Rhythm: Normal rate and regular rhythm.     Heart sounds: No murmur  heard.   Pulmonary:     Effort: Pulmonary effort is normal. No respiratory distress.     Breath sounds: Decreased breath sounds present. No wheezing, rhonchi or rales.  Abdominal:     Palpations: Abdomen is soft.     Tenderness: There is no abdominal tenderness.  Musculoskeletal:        General: No edema.     Cervical back: Neck supple.  Skin:    General: Skin is warm and dry.     Capillary Refill: Capillary refill takes less than 2 seconds.  Neurological:     General: No focal deficit present.     Mental Status: She is alert.  Psychiatric:        Mood and Affect: Mood and affect and mood normal.      ____________________________________________   LABS (all labs ordered are listed, but only abnormal results are displayed)  Labs Reviewed  CBC WITH DIFFERENTIAL/PLATELET - Abnormal; Notable for the following components:      Result Value   RBC 5.45 (*)    All other components within normal limits  FIBRIN DERIVATIVES D-DIMER ([facility] ONLY) - Abnormal; Notable for the following components:   Fibrin derivatives D-dimer ([facility]) 534.86 (*)    All other components within normal limits  COMPREHENSIVE METABOLIC PANEL  PROCALCITONIN  TROPONIN I (HIGH SENSITIVITY)  TROPONIN I (HIGH SENSITIVITY)   ____________________________________________  EKG  Sinus rhythm with ventricular rate of 96, normal axis, unremarkable intervals, no clear evidence of acute ischemia other than isolated nonspecific change in V2.  No significant underlying arrhythmia. ____________________________________________  RADIOLOGY  ED MD interpretation: No  pneumothorax, effusion, edema, focal consolidation or other clear acute intrathoracic process.  Official radiology report(s): DG Chest 2 View  Result Date: 04/02/2020 CLINICAL DATA:  Shortness of breath, COVID-19 positive, history COPD and emphysema EXAM: CHEST - 2 VIEW COMPARISON:  01/08/2020 FINDINGS: Normal heart size, mediastinal contours, and pulmonary vascularity. Atherosclerotic calcification aorta. Bronchitic and emphysematous changes consistent with COPD. Minimal atelectasis LEFT base. No acute infiltrate, pleural effusion, or pneumothorax. Bones demineralized. IMPRESSION: COPD changes with minimal atelectasis at LEFT base. Aortic Atherosclerosis (ICD10-I70.0) and Emphysema (ICD10-J43.9). Electronically Signed   By: Lavonia Dana M.D.   On: 04/02/2020 11:23    ____________________________________________   PROCEDURES  Procedure(s) performed (including Critical Care):  .1-3 Lead EKG Interpretation Performed by: Lucrezia Starch, MD Authorized by: Lucrezia Starch, MD     Interpretation: normal     ECG rate assessment: normal     Rhythm: sinus rhythm     Ectopy: none     Conduction: normal       ____________________________________________   INITIAL IMPRESSION / ASSESSMENT AND PLAN / ED COURSE      Patient presents with above to history exam for assessment of persistent cough chest tightness shortness of breath fatigue and general malaise after recent diagnosis of COVID-19 as described above.  On arrival she is afebrile and hemodynamically stable.  On trial of ambulation in the room her SPO2 is greater than 90% after ambulating in place for 30 seconds.  She has no wheezing or rhonchi on exam to suggest COPD exacerbation or acute bacterial pneumonia.  In addition she has known fever and her white blood cell count is not elevated and I have a low suspicion for acute bacterial superinfection.   2 view chest x-ray shows some atelectasis in the left base without focal  consolidation, infarct, or effusion, edema, or other acute intrathoracic process.  There is some  evidence of chronic changes likely associated patient COPD.  No pneumothorax.  Initial differential includes but is not limited to ACS, PE, arrhythmia, symptomatic anemia, metabolic derangements, heart failure, and persistent symptoms from COVID-19.  Low suspicion for heart failure given patient does not appear volume overloaded on exam with no history of heart failure and has no pulmonary on her x-ray. CBC shows no anemia or leukocytosis.  CMP shows no significant for metabolic derangements.  Troponin is 3 and given symptom onset greater than 3 hours prior to presentation of a low suspicion for ACS time.  No evidence of myocarditis.  D-dimer is 534 and patient extremely low risk for PE per years algorithm.  Will defer any further work-up at this time.  I suspect patient symptoms are all related to her COVID-19 infection.  Discussed this with patient.  Will write short course of prednisone to the history of COPD and reported wheezing at home although is not hearing today.  Will defer any antibiotics at this time given absence of fever, elevated white blood cell count, focal consolidation on chest x-ray and undetectable procalcitonin.  Advised patient on expected clinical course.  Counseled patient on importance of tobacco cessation.  Patient discharged stable condition.  Return cautions advised and discussed.       ____________________________________________   FINAL CLINICAL IMPRESSION(S) / ED DIAGNOSES  Final diagnoses:  COVID  Tobacco abuse counseling  SOB (shortness of breath)    Medications  predniSONE (DELTASONE) tablet 50 mg (50 mg Oral Given 04/02/20 1300)     ED Discharge Orders         Ordered    predniSONE (DELTASONE) 10 MG tablet  Daily        04/02/20 1346    Ambulatory referral for Covid Treatment        04/02/20 1347           Note:  This document was prepared using  Dragon voice recognition software and may include unintentional dictation errors.   Lucrezia Starch, MD 04/02/20 1400

## 2020-04-02 NOTE — ED Triage Notes (Signed)
Pt to ED via POV, pt states that she was diagnosed with COVID on Wednesday. Pt reports that she is feeling short of breath and that when she coughs or walks her sats drop into the 80's. Pt states that she is having discomfort in her lungs. Pt has congested sounded cough. Pt is in NAD.

## 2020-04-03 ENCOUNTER — Telehealth: Payer: Self-pay

## 2020-04-03 ENCOUNTER — Telehealth (HOSPITAL_COMMUNITY): Payer: Self-pay | Admitting: Adult Health

## 2020-04-03 MED ORDER — BENZONATATE 200 MG PO CAPS: 200.0000 mg | ORAL_CAPSULE | Freq: Three times a day (TID) | ORAL | 0 refills | Status: DC | PRN

## 2020-04-03 NOTE — Telephone Encounter (Signed)
Copied from [place] [id number]. Topic: General - Other >> Apr 03, 2020  8:02 AM Keene Breath wrote: Reason for CRM: Patient called to ask the doctor to review her blood work from the hospital.  Please call patient to discuss some questions she has after testing positive for covid.  CB# [phone]

## 2020-04-03 NOTE — Telephone Encounter (Signed)
I connected by phone with Andrea [name] on 04/03/2020 at 12:55 PM to discuss the potential use of a new treatment for mild to moderate COVID-19 viral infection in non-hospitalized patients.  This patient is a 55 y.o. female that meets the FDA criteria for Emergency Use Authorization of COVID monoclonal antibody sotrovimab.  Has a (+) direct SARS-CoV-2 viral test result  Has mild or moderate COVID-19   Is NOT hospitalized due to COVID-19  Is within 10 days of symptom onset  Has at least one of the high risk factor(s) for progression to severe COVID-19 and/or hospitalization as defined in EUA.  Specific high risk criteria : BMI > 25 and Chronic Lung Disease   I have spoken and communicated the following to the patient or parent/caregiver regarding COVID monoclonal antibody treatment:  1. FDA has authorized the emergency use for the treatment of mild to moderate COVID-19 in adults and pediatric patients with positive results of direct SARS-CoV-2 viral testing who are 14 years of age and older weighing at least 40 kg, and who are at high risk for progressing to severe COVID-19 and/or hospitalization.  2. The significant known and potential risks and benefits of COVID monoclonal antibody, and the extent to which such potential risks and benefits are unknown.  3. Information on available alternative treatments and the risks and benefits of those alternatives, including clinical trials.  4. Patients treated with COVID monoclonal antibody should continue to self-isolate and use infection control measures (e.g., wear mask, isolate, social distance, avoid sharing personal items, clean and disinfect "high touch" surfaces, and frequent handwashing) according to CDC guidelines.   5. The patient or parent/caregiver has the option to accept or refuse COVID monoclonal antibody treatment.  After reviewing this information with the patient, the patient has DECLINED offer to receive the infusion. Scot Dock, NP 04/03/2020 12:55 PM

## 2020-04-03 NOTE — Telephone Encounter (Signed)
Dr Caryn Section, are you able to review labs from her hospital visit? Please advise.

## 2020-04-03 NOTE — Telephone Encounter (Signed)
Aspirin doesn't really prevent clots in lungs or legs, but is pretty effective at preventing heart attacks and strokes, there wouldn't be anything wrong with taking 81mg  daily.  Have already sent in prescription for tessalon for her cough.

## 2020-04-03 NOTE — Telephone Encounter (Signed)
D-dimer was slightly elevated. This is common with infections, especially Covid, but can sometimes be an indication of blood clots. If she is having pains in her chest then she should have a CTA scan of her legs. If she has any new pain in her legs then should have and ultrasound of the arteries in her legs.

## 2020-04-03 NOTE — Telephone Encounter (Signed)
I called and spoke with patient. She denies any chest pain or leg pain. Patient wants to know if you think she should take a daily prophalactic aspirin?  She also says she sent Dr. Caryn Section a message in [place] asking for a prescription for Tessalon Perles to help with coughing. Patient has some head and nasal congestion and would something to help with that also. Please advise.

## 2020-04-04 NOTE — Telephone Encounter (Addendum)
Patient advised and verbalized understanding. Andrea [name] says Andrea [name] sent a mychart message to Dr. Caryn Section yesterday asking if he could give her something to help with head and sinus congestion. Andrea [name] has pain in her sinuses and teeth. Andrea [name] has been taking Mucinex, using a humidifier and nasal spray. Please advise.

## 2020-04-05 MED ORDER — FLUTICASONE PROPIONATE 50 MCG/ACT NA SUSP
2.0000 | Freq: Every day | NASAL | 6 refills | Status: DC
Start: 2020-04-05 — End: 2021-01-30

## 2020-04-05 NOTE — Addendum Note (Signed)
Addended by: Birdie Sons on: 04/05/2020 01:36 PM   Modules accepted: Orders

## 2020-04-18 ENCOUNTER — Encounter: Payer: Self-pay | Admitting: Family Medicine

## 2020-04-18 ENCOUNTER — Other Ambulatory Visit: Payer: Self-pay

## 2020-04-18 ENCOUNTER — Ambulatory Visit (INDEPENDENT_AMBULATORY_CARE_PROVIDER_SITE_OTHER): Payer: Medicare Other | Admitting: Family Medicine

## 2020-04-18 VITALS — BP 118/68 | HR 83 | Temp 97.7°F | Resp 18 | Wt 156.4 lb

## 2020-04-18 DIAGNOSIS — L930 Discoid lupus erythematosus: Secondary | ICD-10-CM

## 2020-04-18 DIAGNOSIS — J441 Chronic obstructive pulmonary disease with (acute) exacerbation: Secondary | ICD-10-CM

## 2020-04-18 NOTE — Patient Instructions (Addendum)
.   Please stop smoking  . Please bring all of your medications to every appointment so we can make sure that our medication list is the same as yours.   . Please call the [facility] [phone]) to schedule a routine screening mammogram.

## 2020-04-18 NOTE — Progress Notes (Signed)
Established patient visit   Patient: Andrea [name]   DOB: February 19, 1966   55 y.o. Female  MRN: [id number] Visit Date: 04/18/2020  Today's healthcare provider: Lelon Huh, MD   Chief Complaint  Patient presents with  . COPD  . Follow-up   Subjective    HPI  Follow up for COPD:  The patient was last seen for this 4 months ago. Changes made at last visit include none. Continue Breztri and continue albuterol nebs and HFA prn.  She reports good compliance with treatment. She feels that condition is Improved. She is not having side effects.   She continue on Breztri originally prescribed by Dr. Patsey Berthold and Duoneb at night before going to bed. She has albuterol inhaler but rarely needs to use it since being on Breztri. She did require course of prednisone with Covid sx last month as below.  -----------------------------------------------------------------------------------------  Follow up ER visit  Patient was seen in ER for COVID-19 and shortness of breath on 04/02/2020. Treatment for this included starting a short course of prednisone. Counseled patient on importance of tobacco cessation.  Patient was discharged home in stable condition. Patient was later given a prescription for Tessalon to help with cough.  She reports good compliance with treatment. She reports this condition is Improved.  -----------------------------------------------------------------------------------------       Medications: Outpatient Medications Prior to Visit  Medication Sig  . albuterol (PROVENTIL) (2.5 MG/3ML) 0.083% nebulizer solution Take 3 mLs (2.5 mg total) by nebulization every 4 (four) hours as needed for wheezing or shortness of breath.  Marland Kitchen albuterol (VENTOLIN HFA) 108 (90 Base) MCG/ACT inhaler Inhale 2 puffs into the lungs every 6 (six) hours as needed for wheezing or shortness of breath.  . Budeson-Glycopyrrol-Formoterol (BREZTRI AEROSPHERE) 160-9-4.8 MCG/ACT AERO Inhale 2 puffs  into the lungs in the morning and at bedtime.  . clonazePAM (KLONOPIN) 1 MG tablet TAKE 1 TABLET (1 MG TOTAL) BY MOUTH 2 (TWO) TIMES DAILY AS NEEDED.  Marland Kitchen DEXILANT 60 MG capsule TAKE 1 CAPSULE (60 MG TOTAL) BY MOUTH DAILY. **PLEASE SCHEDULE 1 YEAR FOLLOW UP APPT**  . DUREZOL 0.05 % EMUL Place 1 drop into the left eye 5 (five) times daily as needed (takes only during flares of uveitis).   . fluticasone (FLONASE) 50 MCG/ACT nasal spray Place 2 sprays into both nostrils daily.  . hydroxychloroquine (PLAQUENIL) 200 MG tablet TAKE 2 TABLETS (400 MG TOTAL) BY MOUTH DAILY. FOR DISCOID LUPUS (L93.0)  . Misc. Devices (PULSE OXIMETER DELUXE) MISC Use daily to monitor oxygen levels  . nitroGLYCERIN (NITROSTAT) 0.4 MG SL tablet Place 1 tablet (0.4 mg total) under the tongue every 5 (five) minutes as needed for chest pain.  Marland Kitchen propranolol (INDERAL) 20 MG tablet TAKE 1 TABLET (20 MG TOTAL) BY MOUTH 2 (TWO) TIMES DAILY. (BETA BLOCKER)  . Respiratory Therapy Supplies (FLUTTER) DEVI 1 Device by Does not apply route daily.  Marland Kitchen Spacer/Aero-Holding Chambers (AEROCHAMBER MV) inhaler Use as instructed  . ipratropium-albuterol (DUONEB) 0.5-2.5 (3) MG/3ML SOLN Take 3 mLs by nebulization every 6 (six) hours as needed.  . [DISCONTINUED] azithromycin (ZITHROMAX) 250 MG tablet Take 2 tabs (500 mg) on day 1 and 1 tab (250mg ) on days 2-5. (Patient not taking: Reported on 04/18/2020)  . [DISCONTINUED] benzonatate (TESSALON) 200 MG capsule Take 1 capsule (200 mg total) by mouth 3 (three) times daily as needed for cough. (Patient not taking: Reported on 04/18/2020)  . [DISCONTINUED] chlorpheniramine-HYDROcodone (TUSSIONEX PENNKINETIC ER) 10-8 MG/5ML SUER Take 5 mLs  by mouth 2 (two) times daily. (Patient not taking: Reported on 04/18/2020)   No facility-administered medications prior to visit.    Review of Systems  Constitutional: Negative for appetite change, chills, fatigue and fever.  Respiratory: Positive for cough (productive with  clear mucus). Negative for chest tightness and shortness of breath (improved).   Cardiovascular: Negative for chest pain and palpitations.  Gastrointestinal: Negative for abdominal pain, nausea and vomiting.  Musculoskeletal: Positive for arthralgias (right ankle) and joint swelling (right ankle).  Neurological: Negative for dizziness and weakness.       Objective    BP (!) 111/57 (BP Location: Left Arm, Patient Position: Sitting, Cuff Size: Normal)   Pulse 83   Temp 97.7 F (36.5 C) (Temporal)   Resp 18   Wt 156 lb 6.4 oz (70.9 kg)   SpO2 99% Comment: room air  BMI 28.61 kg/m     Physical Exam    General: Appearance:     Overweight female in no acute distress  Eyes:    PERRL, conjunctiva/corneas clear, EOM's intact       Lungs:     Clear to auscultation bilaterally, respirations unlabored  Heart:    Normal heart rate. Normal rhythm. No murmurs, rubs, or gallops.   MS:   All extremities are intact.   Neurologic:   Awake, alert, oriented x 3. No apparent focal neurological           defect.         Assessment & Plan     1. COPD exacerbation ([place]) Secondary to Covid, but now back on her regular maintenance inhalers on which she is doing very well.   2. Discoid lupus She is having a bit of a skin breakout and swelling of her right ankle. Advised she needs to get back in with rheumatology to evaluate.     She declined flu vaccine.      The entirety of the information documented in the History of Present Illness, Review of Systems and Physical Exam were personally obtained by me. Portions of this information were initially documented by the CMA and reviewed by me for thoroughness and accuracy.      Lelon Huh, MD  [facility] [phone] (phone) [phone] (fax)  [place]

## 2020-04-22 ENCOUNTER — Other Ambulatory Visit: Payer: Self-pay | Admitting: Gastroenterology

## 2020-05-22 ENCOUNTER — Telehealth: Payer: Medicare Other | Admitting: Physician Assistant

## 2020-05-22 ENCOUNTER — Ambulatory Visit: Payer: Self-pay

## 2020-05-22 ENCOUNTER — Ambulatory Visit: Payer: Medicaid Other | Admitting: Family Medicine

## 2020-05-22 ENCOUNTER — Encounter: Payer: Self-pay | Admitting: Family Medicine

## 2020-05-22 DIAGNOSIS — J019 Acute sinusitis, unspecified: Secondary | ICD-10-CM | POA: Diagnosis not present

## 2020-05-22 DIAGNOSIS — B9689 Other specified bacterial agents as the cause of diseases classified elsewhere: Secondary | ICD-10-CM

## 2020-05-22 MED ORDER — AZITHROMYCIN 250 MG PO TABS: ORAL_TABLET | ORAL | 0 refills | Status: DC

## 2020-05-22 MED ORDER — AMOXICILLIN-POT CLAVULANATE 875-125 MG PO TABS
1.0000 | ORAL_TABLET | Freq: Two times a day (BID) | ORAL | 0 refills | Status: DC
Start: 2020-05-22 — End: 2020-05-22

## 2020-05-22 NOTE — Addendum Note (Signed)
Addended by: Brunetta Jeans on: 05/22/2020 03:58 PM   Modules accepted: Orders

## 2020-05-22 NOTE — Progress Notes (Addendum)
Message sent to patient requesting further input regarding current symptoms. Awaiting patient response.  

## 2020-05-22 NOTE — Progress Notes (Signed)
We are sorry that you are not feeling well.  Here is how we plan to help!  Based on what you have shared with me it looks like you have sinusitis.  Sinusitis is inflammation and infection in the sinus cavities of the head.  Based on your presentation I believe you most likely have Acute Bacterial Sinusitis.  This is an infection caused by bacteria and is treated with antibiotics. I have prescribed Azithromycin. You may use an oral decongestant such as Mucinex D or if you have glaucoma or high blood pressure use plain Mucinex. Saline nasal spray help and can safely be used as often as needed for congestion.  If you develop worsening sinus pain, fever or notice severe headache and vision changes, or if symptoms are not better after completion of antibiotic, please schedule an appointment with a health care provider.    Sinus infections are not as easily transmitted as other respiratory infection, however we still recommend that you avoid close contact with loved ones, especially the very young and elderly.  Remember to wash your hands thoroughly throughout the day as this is the number one way to prevent the spread of infection!  Home Care:  Only take medications as instructed by your medical team.  Complete the entire course of an antibiotic.  Do not take these medications with alcohol.  A steam or ultrasonic humidifier can help congestion.  You can place a towel over your head and breathe in the steam from hot water coming from a faucet.  Avoid close contacts especially the very young and the elderly.  Cover your mouth when you cough or sneeze.  Always remember to wash your hands.  Get Help Right Away If:  You develop worsening fever or sinus pain.  You develop a severe head ache or visual changes.  Your symptoms persist after you have completed your treatment plan.  Make sure you  Understand these instructions.  Will watch your condition.  Will get help right away if you are not  doing well or get worse.  Your e-visit answers were reviewed by a board certified advanced clinical practitioner to complete your personal care plan.  Depending on the condition, your plan could have included both over the counter or prescription medications.  If there is a problem please reply  once you have received a response from your provider.  Your safety is important to [place].  If you have drug allergies check your prescription carefully.    You can use MyChart to ask questions about today's visit, request a non-urgent call back, or ask for a work or school excuse for 24 hours related to this e-Visit. If it has been greater than 24 hours you will need to follow up with your provider, or enter a new e-Visit to address those concerns.  You will get an e-mail in the next two days asking about your experience.  I hope that your e-visit has been valuable and will speed your recovery. Thank you for using e-visits.

## 2020-05-22 NOTE — Progress Notes (Signed)
Patient intolerant to multiple antibiotics. Also interactions between azithromycin and FQ with her plaquenil. Awaiting her response about antibiotics she has tolerated in the past to choose the most suitable option.

## 2020-05-22 NOTE — Progress Notes (Signed)
I have spent 5 minutes in review of e-visit questionnaire, review and updating patient chart, medical decision making and response to patient.   Mayline Dragon Cody Zadiel Leyh, PA-C    

## 2020-05-22 NOTE — Addendum Note (Signed)
Addended by: Brunetta Jeans on: 05/22/2020 03:44 PM   Modules accepted: Orders

## 2020-05-22 NOTE — Telephone Encounter (Signed)
Pt. Reports she started having sinus pain and pressure this weekend . Eyes are puffy, pain behind eyes. No fever. Nasal drainage is clear. "I know I'm getting a sinus infection. I'm trying to get ahead of it before I get really sick." Using Flonase, netty pot  and taking Claritin. Agent made a virtual appointment for Thursday . Pt. Would like an appointment sooner if possible.Please advise.  Answer Assessment - Initial Assessment Questions 1. LOCATION: "Where does it hurt?"      Eyes 2. ONSET: "When did the sinus pain start?"  (e.g., hours, days)      This weekend 3. SEVERITY: "How bad is the pain?"   (Scale 1-10; mild, moderate or severe)   - MILD (1-3): doesn't interfere with normal activities    - MODERATE (4-7): interferes with normal activities (e.g., work or school) or awakens from sleep   - SEVERE (8-10): excruciating pain and patient unable to do any normal activities        6 4. RECURRENT SYMPTOM: "Have you ever had sinus problems before?" If Yes, ask: "When was the last time?" and "What happened that time?"      Yes 5. NASAL CONGESTION: "Is the nose blocked?" If Yes, ask: "Can you open it or must you breathe through the mouth?"     Yes 6. NASAL DISCHARGE: "Do you have discharge from your nose?" If so ask, "What color?"     Clear 7. FEVER: "Do you have a fever?" If Yes, ask: "What is it, how was it measured, and when did it start?"      No 8. OTHER SYMPTOMS: "Do you have any other symptoms?" (e.g., sore throat, cough, earache, difficulty breathing)     Mild left ear pain 9. PREGNANCY: "Is there any chance you are pregnant?" "When was your last menstrual period?"     No  Protocols used: SINUS PAIN OR CONGESTION-A-AH

## 2020-05-23 NOTE — Telephone Encounter (Addendum)
E-visit Raiford Noble, PA-C 05/22/2020

## 2020-05-25 ENCOUNTER — Telehealth: Payer: Medicare Other | Admitting: Adult Health

## 2020-05-28 ENCOUNTER — Telehealth: Payer: Medicare Other | Admitting: Family

## 2020-05-28 DIAGNOSIS — J441 Chronic obstructive pulmonary disease with (acute) exacerbation: Secondary | ICD-10-CM

## 2020-05-28 NOTE — Progress Notes (Signed)
Based on what you shared with me, I feel your condition warrants further evaluation and I recommend that you be seen for a face to face office visit.   I recommend going to the Urgent Care today. Given you have already taken an antibiotic and had not had improvement. I am sure you need some steroids.    NOTE: If you entered your credit card information for this eVisit, you will not be charged. You may see a "hold" on your card for the $35 but that hold will drop off and you will not have a charge processed.   If you are having a true medical emergency please call 911.      For an urgent face to face visit, [place] has five urgent care centers for your convenience:     [facility] [place] Get Driving Directions [phone] [address], [place] [address] . 10 am - 6pm Monday - Friday    [facility]) Get Driving Directions [phone] [address] [place], [place] [address] . 10 am to 8 pm Monday-Friday . 12 pm to 8 pm [facility] Get Driving Directions [phone] [place], [place], [place] [address] . 8 am to 8 pm Monday-Friday . 9 am to 6 pm Saturday . 11 am to 6 pm Sunday     [facility] Get Driving Directions  [phone] [address] [place], [place] [address] . 8 am to 8 pm Monday-Friday . 8 am to 4 pm [facility] at [place] Get Driving Directions [phone] [address] [place], [place] [address] . 12 pm to 6 pm Monday-Friday      Your e-visit answers were reviewed by a board certified advanced clinical practitioner to complete your personal care plan.  Thank you for using e-Visits.

## 2020-05-29 ENCOUNTER — Telehealth (INDEPENDENT_AMBULATORY_CARE_PROVIDER_SITE_OTHER): Payer: Medicare Other | Admitting: Family Medicine

## 2020-05-29 DIAGNOSIS — J441 Chronic obstructive pulmonary disease with (acute) exacerbation: Secondary | ICD-10-CM

## 2020-05-29 MED ORDER — ALBUTEROL SULFATE HFA 108 (90 BASE) MCG/ACT IN AERS: 2.0000 | INHALATION_SPRAY | Freq: Four times a day (QID) | RESPIRATORY_TRACT | 6 refills | Status: DC | PRN

## 2020-05-29 MED ORDER — LEVOFLOXACIN 750 MG PO TABS: 750.0000 mg | ORAL_TABLET | Freq: Every day | ORAL | 0 refills | Status: AC

## 2020-05-29 NOTE — Progress Notes (Signed)
MyChart Video Visit    Virtual Visit via Video Note   This visit type was conducted due to national recommendations for restrictions regarding the COVID-19 Pandemic (e.g. social distancing) in an effort to limit this patient's exposure and mitigate transmission in our community. This patient is at least at moderate risk for complications without adequate follow up. This format is felt to be most appropriate for this patient at this time. Physical exam was limited by quality of the video and audio technology used for the visit.   Patient location: home Provider location: [facility]  I discussed the limitations of evaluation and management by telemedicine and the availability of in person appointments. The patient expressed understanding and agreed to proceed.  Patient: Andrea [name]   DOB: 04/14/1965   55 y.o. Female  MRN: [id number] Visit Date: 05/29/2020  Today's healthcare provider: Lelon Huh, MD   Chief Complaint  Patient presents with  . Cough  I,Porsha C McClurkin,acting as a scribe for Lelon Huh, MD.,have documented all relevant documentation on the behalf of Lelon Huh, MD,as directed by  Lelon Huh, MD while in the presence of Lelon Huh, MD.  Subjective    Cough This is a recurrent problem. The current episode started 1 to 4 weeks ago. The problem has been unchanged. The cough is productive of sputum. Associated symptoms include nasal congestion, postnasal drip, shortness of breath and wheezing. Pertinent negatives include no chills, ear pain, headaches, rhinorrhea or sore throat. She has tried a beta-agonist inhaler and OTC cough suppressant for the symptoms. The treatment provided mild relief. Her past medical history is significant for COPD, emphysema and pneumonia. There is no history of asthma, bronchiectasis, bronchitis or environmental allergies.  Has finished zpack, but still having cough and sweats at night. Sinuses a little better. Using nebulizer and  inhaler which helps for awhile.       Medications: Outpatient Medications Prior to Visit  Medication Sig  . albuterol (PROVENTIL) (2.5 MG/3ML) 0.083% nebulizer solution Take 3 mLs (2.5 mg total) by nebulization every 4 (four) hours as needed for wheezing or shortness of breath.  Marland Kitchen albuterol (VENTOLIN HFA) 108 (90 Base) MCG/ACT inhaler Inhale 2 puffs into the lungs every 6 (six) hours as needed for wheezing or shortness of breath.  Marland Kitchen azithromycin (ZITHROMAX) 250 MG tablet Take 2 tablets on Day 1. Then take 1 tablet daily. Hold Plaquenil while on this medication. Resume per pharmacist instructions.  . Budeson-Glycopyrrol-Formoterol (BREZTRI AEROSPHERE) 160-9-4.8 MCG/ACT AERO Inhale 2 puffs into the lungs in the morning and at bedtime.  . clonazePAM (KLONOPIN) 1 MG tablet TAKE 1 TABLET (1 MG TOTAL) BY MOUTH 2 (TWO) TIMES DAILY AS NEEDED.  Marland Kitchen DEXILANT 60 MG capsule TAKE 1 CAPSULE (60 MG TOTAL) BY MOUTH DAILY. **PLEASE SCHEDULE 1 YEAR FOLLOW UP APPT**  . DUREZOL 0.05 % EMUL Place 1 drop into the left eye 5 (five) times daily as needed (takes only during flares of uveitis).   . fluticasone (FLONASE) 50 MCG/ACT nasal spray Place 2 sprays into both nostrils daily.  . hydroxychloroquine (PLAQUENIL) 200 MG tablet TAKE 2 TABLETS (400 MG TOTAL) BY MOUTH DAILY. FOR DISCOID LUPUS (L93.0)  . Misc. Devices (PULSE OXIMETER DELUXE) MISC Use daily to monitor oxygen levels  . nitroGLYCERIN (NITROSTAT) 0.4 MG SL tablet Place 1 tablet (0.4 mg total) under the tongue every 5 (five) minutes as needed for chest pain.  Marland Kitchen propranolol (INDERAL) 20 MG tablet TAKE 1 TABLET (20 MG TOTAL) BY MOUTH 2 (TWO)  TIMES DAILY. (BETA BLOCKER)  . Respiratory Therapy Supplies (FLUTTER) DEVI 1 Device by Does not apply route daily.  Marland Kitchen Spacer/Aero-Holding Chambers (AEROCHAMBER MV) inhaler Use as instructed  . ipratropium-albuterol (DUONEB) 0.5-2.5 (3) MG/3ML SOLN Take 3 mLs by nebulization every 6 (six) hours as needed.   No  facility-administered medications prior to visit.    Review of Systems  Constitutional: Positive for fatigue. Negative for appetite change and chills.  HENT: Positive for congestion and postnasal drip. Negative for ear pain, rhinorrhea, sinus pressure, sinus pain, sneezing and sore throat.   Respiratory: Positive for cough, chest tightness, shortness of breath and wheezing.   Allergic/Immunologic: Negative for environmental allergies.  Neurological: Positive for weakness. Negative for headaches.    {Labs  Heme  Chem  Endocrine  Serology  Results Review (optional):[id number]::" "}  Objective    There were no vitals taken for this visit.   Physical Exam   Awake, alert, oriented x 3. In no apparent distress    Assessment & Plan     1. COPD exacerbation ([facility]) Slight improvement with azithromycin prescribed last week. Is off of hydroxychloroquine for now.  - levofloxacin (LEVAQUIN) 750 MG tablet; Take 1 tablet (750 mg total) by mouth daily for 7 days.  Dispense: 7 tablet; Refill: 0 - albuterol (VENTOLIN HFA) 108 (90 Base) MCG/ACT inhaler; Inhale 2 puffs into the lungs every 6 (six) hours as needed for wheezing or shortness of breath.  Dispense: 18 g; Refill: 6   Call if symptoms change or if not rapidly improving.         I discussed the assessment and treatment plan with the patient. The patient was provided an opportunity to ask questions and all were answered. The patient agreed with the plan and demonstrated an understanding of the instructions.   The patient was advised to call back or seek an in-person evaluation if the symptoms worsen or if the condition fails to improve as anticipated.  I provided 12 minutes of non-face-to-face time during this encounter.  The entirety of the information documented in the History of Present Illness, Review of Systems and Physical Exam were personally obtained by me. Portions of this information were initially documented by the CMA and  reviewed by me for thoroughness and accuracy.     Lelon Huh, MD [facility] [phone] (phone) [phone] (fax)  [place]

## 2020-06-06 ENCOUNTER — Other Ambulatory Visit: Payer: Self-pay | Admitting: Oncology

## 2020-06-06 DIAGNOSIS — Z122 Encounter for screening for malignant neoplasm of respiratory organs: Secondary | ICD-10-CM

## 2020-06-06 DIAGNOSIS — Z87891 Personal history of nicotine dependence: Secondary | ICD-10-CM

## 2020-06-08 ENCOUNTER — Telehealth: Payer: Medicare Other | Admitting: Physician Assistant

## 2020-06-08 DIAGNOSIS — J0101 Acute recurrent maxillary sinusitis: Secondary | ICD-10-CM

## 2020-06-08 DIAGNOSIS — J019 Acute sinusitis, unspecified: Secondary | ICD-10-CM

## 2020-06-08 MED ORDER — AZITHROMYCIN 250 MG PO TABS: ORAL_TABLET | ORAL | 0 refills | Status: DC

## 2020-06-08 MED ORDER — PREDNISONE 20 MG PO TABS: 40.0000 mg | ORAL_TABLET | Freq: Every day | ORAL | 0 refills | Status: DC

## 2020-06-08 NOTE — Addendum Note (Signed)
Addended by: Brunetta Jeans on: 06/08/2020 03:55 PM   Modules accepted: Orders, Level of Service

## 2020-06-08 NOTE — Progress Notes (Addendum)
Andrea [name], Andrea [name] are scheduled for a virtual visit with your provider today.    Just as we do with appointments in the office, we must obtain your consent to participate.  Your consent will be active for this visit and any virtual visit you may have with one of our providers in the next 365 days.    If you have a MyChart account, I can also send a copy of this consent to you electronically.  All virtual visits are billed to your insurance company just like a traditional visit in the office.  As this is a virtual visit, video technology does not allow for your provider to perform a traditional examination.  This may limit your provider's ability to fully assess your condition.  If your provider identifies any concerns that need to be evaluated in person or the need to arrange testing such as labs, EKG, etc, we will make arrangements to do so.    Although advances in technology are sophisticated, we cannot ensure that it will always work on either your end or our end.  If the connection with a video visit is poor, we may have to switch to a telephone visit.  With either a video or telephone visit, we are not always able to ensure that we have a secure connection.   I need to obtain your verbal consent now.   Are you willing to proceed with your visit today?   Andrea [name] has provided verbal consent on 06/08/2020 for a virtual visit (video or telephone).   Leeanne Rio, PA-C 06/08/2020  3:34 PM      Virtual Visit via Video   I connected with patient on 06/08/20 at  3:30 PM EDT by a video enabled telemedicine application and verified that I am speaking with the correct person using two identifiers.  Location patient: Home Location provider: [place] participating in the virtual visit: Patient, Provider  I discussed the limitations of evaluation and management by telemedicine and the availability of in person appointments. The patient expressed understanding and  agreed to proceed.  Subjective:   HPI:  Patient presents via [place] today c/o recurrence of sinus pain over the past couple of days. Was recently seen and diagnosed with bacterial sinusitis 1.5 weeks ago. At that time she was started on Azithromycin due to her medication allergies. Notes symptoms mostly resolved with course of antibiotic. Had a follow-up with her PCP and giving symptoms not fully resolved, she was started on Levaquin. States this medicine did not agree with her or help her. As such, she notes symptoms festered until a couple of days ago when they acutely worsened. Denies fever, chills, chest pain or SOB. Is taking all chronic medications as directed. Has ENT and Pulmonologist but was unable to get in with them until the end of next week or after. Was also unable to be seen by her PCP before the weekend.   ROS:   See pertinent positives and negatives per HPI.  Patient Active Problem List   Diagnosis Date Noted  . Personal history of colonic polyps   . Polyp of transverse colon   . Varicose veins of bilateral lower extremities with pain 11/22/2016  . Restless leg 11/18/2016  . Iron deficiency 11/18/2016  . Allergic rhinitis 06/11/2016  . Hyperlipidemia 03/19/2015  . COPD exacerbation ([place]) 10/04/2014  . Anxiety 09/22/2014  . Depression 09/22/2014  . Dyshidrosis 09/22/2014  . GERD (gastroesophageal reflux disease) 09/22/2014  . Insomnia 09/22/2014  .  Lung nodule, multiple 09/22/2014  . Panic disorder 09/22/2014  . Palpitations 01/17/2014  . Pulmonary nodule 06/10/2013  . Shortness of breath 05/20/2013  . COPD, GOLD B 05/20/2013  . Tobacco abuse 05/20/2013  . Daytime somnolence 05/20/2013  . Back pain 08/28/2012  . Discoid lupus 05/30/2012  . Generalized abdominal pain 03/11/2012  . Pain in joint 03/09/2012  . Psoriasis 02/21/2012  . Fibromyalgia 02/11/2012  . Lupus ([place]) 02/11/2012  . Panic attacks 02/11/2012  . History of adenomatous polyp of colon  09/13/2011  . Heartburn 08/06/2011  . PVC's (premature ventricular contractions) 01/29/2011  . Essential (primary) hypertension 03/04/1998    Social History   Tobacco Use  . Smoking status: Current Every Day Smoker    Packs/day: 1.00    Years: 20.00    Pack years: 20.00    Types: Cigarettes  . Smokeless tobacco: Never Used  . Tobacco comment: 1 cig today/ 01/13/2020  Substance Use Topics  . Alcohol use: No    Alcohol/week: 0.0 standard drinks    Current Outpatient Medications:  .  albuterol (PROVENTIL) (2.5 MG/3ML) 0.083% nebulizer solution, Take 3 mLs (2.5 mg total) by nebulization every 4 (four) hours as needed for wheezing or shortness of breath., Disp: 75 mL, Rfl: 2 .  albuterol (VENTOLIN HFA) 108 (90 Base) MCG/ACT inhaler, Inhale 2 puffs into the lungs every 6 (six) hours as needed for wheezing or shortness of breath., Disp: 18 g, Rfl: 6 .  Budeson-Glycopyrrol-Formoterol (BREZTRI AEROSPHERE) 160-9-4.8 MCG/ACT AERO, Inhale 2 puffs into the lungs in the morning and at bedtime., Disp: 10.7 g, Rfl: 0 .  clonazePAM (KLONOPIN) 1 MG tablet, TAKE 1 TABLET (1 MG TOTAL) BY MOUTH 2 (TWO) TIMES DAILY AS NEEDED., Disp: 60 tablet, Rfl: 3 .  DEXILANT 60 MG capsule, TAKE 1 CAPSULE (60 MG TOTAL) BY MOUTH DAILY. **PLEASE SCHEDULE 1 YEAR FOLLOW UP APPT**, Disp: 90 capsule, Rfl: 0 .  DUREZOL 0.05 % EMUL, Place 1 drop into the left eye 5 (five) times daily as needed (takes only during flares of uveitis). , Disp: , Rfl:  .  fluticasone (FLONASE) 50 MCG/ACT nasal spray, Place 2 sprays into both nostrils daily., Disp: 16 g, Rfl: 6 .  hydroxychloroquine (PLAQUENIL) 200 MG tablet, TAKE 2 TABLETS (400 MG TOTAL) BY MOUTH DAILY. FOR DISCOID LUPUS (L93.0), Disp: 180 tablet, Rfl: 0 .  ipratropium-albuterol (DUONEB) 0.5-2.5 (3) MG/3ML SOLN, Take 3 mLs by nebulization every 6 (six) hours as needed., Disp: 360 mL, Rfl: 0 .  Misc. Devices (PULSE OXIMETER DELUXE) MISC, Use daily to monitor oxygen levels, Disp: 1  each, Rfl: 0 .  nitroGLYCERIN (NITROSTAT) 0.4 MG SL tablet, Place 1 tablet (0.4 mg total) under the tongue every 5 (five) minutes as needed for chest pain., Disp: 25 tablet, Rfl: 0 .  propranolol (INDERAL) 20 MG tablet, TAKE 1 TABLET (20 MG TOTAL) BY MOUTH 2 (TWO) TIMES DAILY. (BETA BLOCKER), Disp: 180 tablet, Rfl: 0 .  Respiratory Therapy Supplies (FLUTTER) DEVI, 1 Device by Does not apply route daily., Disp: 1 each, Rfl: 0 .  Spacer/Aero-Holding Chambers (AEROCHAMBER MV) inhaler, Use as instructed, Disp: 1 each, Rfl: 0  Allergies  Allergen Reactions  . Alum & Mag Hydroxide-Simeth Diarrhea and Nausea Only  . Aspirin     Nosebleed   . Augmentin [Amoxicillin-Pot Clavulanate]     Palpitations   . Bactrim [Sulfamethoxazole-Trimethoprim] Itching  . Cefdinir Other (See Comments)    tachycardia  . Chantix [Varenicline]     Bad dreams  .  Doxycycline Other (See Comments)    Nausea, migraine  . Multaq [Dronedarone] Other (See Comments)    Lip/ mouth numbness/ tongue swelling  . Ivp Dye [Iodinated Diagnostic Agents] Palpitations    Objective:   There were no vitals taken for this visit.  Patient is well-developed, well-nourished in no acute distress.  Resting comfortably at home.  Head is normocephalic, atraumatic.  No labored breathing.  Speech is clear and coherent with logical content.  Patient is alert and oriented at baseline.   Assessment and Plan:   1. Recurrent maxillary sinusitis Giving medication allergy and significant improvement with Azithromycin, will repeat course along with a short burst of steroid. She is to hold off on her Plaquenil while on z-pack, resuming per pharmacist/PCP instruction. She is to continue her routine allergy and OTC sinus medications. Has follow-up with specialist next week. Discussed with her no further care for this current issue will be given via Telehealth services as she needs examination and further workup for this recurring issue.      Leeanne Rio, PA-C

## 2020-06-08 NOTE — Progress Notes (Signed)
We are sorry that you are not feeling well.  Here is how we plan to help!  Based on what you have shared with me it looks like you have sinusitis.  Sinusitis is inflammation and infection in the sinus cavities of the head.  Based on your presentation I believe you most likely have Acute Viral Sinusitis.This is an infection most likely caused by a virus. There is not specific treatment for viral sinusitis other than to help you with the symptoms until the infection runs its course.  You may use an oral decongestant such as Mucinex D or if you have glaucoma or high blood pressure use plain Mucinex. Saline nasal spray help and can safely be used as often as needed for congestion, I recommend you continue the fluticasone nasal spray and mucinex. You are doing all the right things!  Some authorities believe that zinc sprays or the use of Echinacea may shorten the course of your symptoms.  Sinus infections are not as easily transmitted as other respiratory infection, however we still recommend that you avoid close contact with loved ones, especially the very young and elderly.  Remember to wash your hands thoroughly throughout the day as this is the number one way to prevent the spread of infection!  Home Care:  Only take medications as instructed by your medical team.  Do not take these medications with alcohol.  A steam or ultrasonic humidifier can help congestion.  You can place a towel over your head and breathe in the steam from hot water coming from a faucet.  Avoid close contacts especially the very young and the elderly.  Cover your mouth when you cough or sneeze.  Always remember to wash your hands.  Get Help Right Away If:  You develop worsening fever or sinus pain.  You develop a severe head ache or visual changes.  Your symptoms persist after you have completed your treatment plan.  Make sure you  Understand these instructions.  Will watch your condition.  Will get help  right away if you are not doing well or get worse.  Your e-visit answers were reviewed by a board certified advanced clinical practitioner to complete your personal care plan.  Depending on the condition, your plan could have included both over the counter or prescription medications.  If there is a problem please reply  once you have received a response from your provider.  Your safety is important to [place].  If you have drug allergies check your prescription carefully.    You can use MyChart to ask questions about today's visit, request a non-urgent call back, or ask for a work or school excuse for 24 hours related to this e-Visit. If it has been greater than 24 hours you will need to follow up with your provider, or enter a new e-Visit to address those concerns.  You will get an e-mail in the next two days asking about your experience.  I hope that your e-visit has been valuable and will speed your recovery. Thank you for using e-visits.  Approximately 5 minutes was spent documenting and reviewing patient's chart.

## 2020-06-11 ENCOUNTER — Encounter: Payer: Self-pay | Admitting: Emergency Medicine

## 2020-06-11 ENCOUNTER — Emergency Department
Admission: EM | Admit: 2020-06-11 | Discharge: 2020-06-11 | Disposition: A | Discharge status: Home / Self Care | Payer: Medicare Other | Attending: Emergency Medicine | Admitting: Emergency Medicine

## 2020-06-11 ENCOUNTER — Emergency Department: Payer: Medicare Other

## 2020-06-11 ENCOUNTER — Other Ambulatory Visit: Payer: Self-pay

## 2020-06-11 DIAGNOSIS — F17210 Nicotine dependence, cigarettes, uncomplicated: Secondary | ICD-10-CM | POA: Diagnosis not present

## 2020-06-11 DIAGNOSIS — R062 Wheezing: Secondary | ICD-10-CM | POA: Diagnosis not present

## 2020-06-11 DIAGNOSIS — Z79899 Other long term (current) drug therapy: Secondary | ICD-10-CM | POA: Diagnosis not present

## 2020-06-11 DIAGNOSIS — J441 Chronic obstructive pulmonary disease with (acute) exacerbation: Secondary | ICD-10-CM | POA: Diagnosis not present

## 2020-06-11 DIAGNOSIS — J069 Acute upper respiratory infection, unspecified: Secondary | ICD-10-CM | POA: Insufficient documentation

## 2020-06-11 DIAGNOSIS — R059 Cough, unspecified: Secondary | ICD-10-CM | POA: Diagnosis not present

## 2020-06-11 DIAGNOSIS — I2510 Atherosclerotic heart disease of native coronary artery without angina pectoris: Secondary | ICD-10-CM | POA: Diagnosis not present

## 2020-06-11 DIAGNOSIS — I10 Essential (primary) hypertension: Secondary | ICD-10-CM | POA: Insufficient documentation

## 2020-06-11 DIAGNOSIS — Z7952 Long term (current) use of systemic steroids: Secondary | ICD-10-CM | POA: Insufficient documentation

## 2020-06-11 MED ORDER — BENZONATATE 200 MG PO CAPS: 200.0000 mg | ORAL_CAPSULE | Freq: Three times a day (TID) | ORAL | 0 refills | Status: DC | PRN

## 2020-06-11 MED ORDER — IPRATROPIUM-ALBUTEROL 0.5-2.5 (3) MG/3ML IN SOLN: 3.0000 mL | Freq: Four times a day (QID) | RESPIRATORY_TRACT | 0 refills | Status: DC | PRN

## 2020-06-11 MED ORDER — IPRATROPIUM-ALBUTEROL 0.5-2.5 (3) MG/3ML IN SOLN
3.0000 mL | Freq: Once | RESPIRATORY_TRACT | Status: AC
  Administered 2020-06-11: 3 mL via RESPIRATORY_TRACT
  Filled 2020-06-11: qty 3

## 2020-06-11 NOTE — ED Triage Notes (Signed)
Pt ports cough, nasal congestion and shortness of breath over the last 4 days. Pt reports is taking a z-pack currently but does not feel like she is getting better. Pt reports has been using her inhalers and taking the bax and steroids with no relief. Pt able to speak in complete sentences.

## 2020-06-11 NOTE — Discharge Instructions (Addendum)
Follow-up with your primary care provider if any continued problems or concerns.  Return to the emergency department if any severe worsening of your breathing such as difficulty breathing or shortness of breath.  Discontinue using the albuterol nebulizer that you are using at home currently and began using the DuoNeb treatment that was sent to your pharmacy today.  Also prescription for Tessalon Perles 1 every 8 hours as needed for cough was sent.  Continue taking your Z-Pak until completely finished as well as the prednisone.  Drink lots of fluids and also consider discontinuing your smoking.

## 2020-06-11 NOTE — ED Provider Notes (Signed)
[facility] Emergency Department Provider Note   ____________________________________________   Event Date/Time   First MD Initiated Contact with Patient 06/11/20 1015     (approximate)  I have reviewed the triage vital signs and the nursing notes.   HISTORY  Chief Complaint Cough, Nasal Congestion, and Shortness of Breath   HPI Andrea [name] is a 55 y.o. female presents to the ED with complaint of productive cough, nasal congestion, shortness of breath over the last 4 days.  Patient had a televisit and was prescribed a Z-Pak which she states does not seem to be helping.  She also has inhalers and has been using steroids without any relief.  Patient has a history of COPD.  She states that she has cut down on her smoking but has not completely discontinued.  Patient is Covid vaccinated.       Past Medical History:  Diagnosis Date  . Anxiety    panic attacks, chest pain  . Arthritis   . COPD (chronic obstructive pulmonary disease) ([place])   . GERD (gastroesophageal reflux disease)   . Hypertension    not medicated  . Non-obstructive CAD in native artery    a. cardiac cath 01/2014: showed minor irregularities, mildly elevated left ventricular end-diastolic pressure and normal ejection fraction; b. 03/2015 MV: low risk.  . Palpitations    a. 08/2016 Event Monitor: occas PVC's, two brief runs of SVT (4 and 7 beats).  Marland Kitchen PONV (postoperative nausea and vomiting)   . Symptomatic PVC's (premature ventricular contractions)   . Tobacco abuse   . Wears dentures    partial upper and lower    Patient Active Problem List   Diagnosis Date Noted  . Personal history of colonic polyps   . Polyp of transverse colon   . Varicose veins of bilateral lower extremities with pain 11/22/2016  . Restless leg 11/18/2016  . Iron deficiency 11/18/2016  . Allergic rhinitis 06/11/2016  . Hyperlipidemia 03/19/2015  . COPD exacerbation ([place]) 10/04/2014  . Anxiety 09/22/2014   . Depression 09/22/2014  . Dyshidrosis 09/22/2014  . GERD (gastroesophageal reflux disease) 09/22/2014  . Insomnia 09/22/2014  . Lung nodule, multiple 09/22/2014  . Panic disorder 09/22/2014  . Palpitations 01/17/2014  . Pulmonary nodule 06/10/2013  . Shortness of breath 05/20/2013  . COPD, GOLD B 05/20/2013  . Tobacco abuse 05/20/2013  . Daytime somnolence 05/20/2013  . Back pain 08/28/2012  . Discoid lupus 05/30/2012  . Generalized abdominal pain 03/11/2012  . Pain in joint 03/09/2012  . Psoriasis 02/21/2012  . Fibromyalgia 02/11/2012  . Lupus ([place]) 02/11/2012  . Panic attacks 02/11/2012  . History of adenomatous polyp of colon 09/13/2011  . Heartburn 08/06/2011  . PVC's (premature ventricular contractions) 01/29/2011  . Essential (primary) hypertension 03/04/1998    Past Surgical History:  Procedure Laterality Date  . CARDIAC CATHETERIZATION  01/03/14  . CARDIAC CATHETERIZATION    . CHOLECYSTECTOMY    . COLONOSCOPY WITH PROPOFOL N/A 10/05/2019   Procedure: COLONOSCOPY WITH PROPOFOL;  Surgeon: Lucilla Lame, MD;  Location: [facility] ENDOSCOPY;  Service: Endoscopy;  Laterality: N/A;  . ESOPHAGOGASTRODUODENOSCOPY (EGD) WITH PROPOFOL N/A 07/11/2016   Procedure: ESOPHAGOGASTRODUODENOSCOPY (EGD) WITH PROPOFOL;  Surgeon: Lucilla Lame, MD;  Location: [place];  Service: Endoscopy;  Laterality: N/A;  . ESOPHAGOGASTRODUODENOSCOPY (EGD) WITH PROPOFOL N/A 10/05/2019   Procedure: ESOPHAGOGASTRODUODENOSCOPY (EGD) WITH PROPOFOL;  Surgeon: Lucilla Lame, MD;  Location: [facility] ENDOSCOPY;  Service: Endoscopy;  Laterality: N/A;  . EYE SURGERY    .  IMAGE GUIDED SINUS SURGERY N/A 11/30/2014   Procedure: IMAGE GUIDED SINUS SURGERY;  Surgeon: Carloyn Manner, MD;  Location: [place];  Service: ENT;  Laterality: N/A;  GAVE DISK TO CE CE  . MAXILLARY ANTROSTOMY Bilateral 11/30/2014   Procedure: MAXILLARY ANTROSTOMY;  Surgeon: Carloyn Manner, MD;  Location: [place];  Service:  ENT;  Laterality: Bilateral;  . SEPTOPLASTY N/A 11/30/2014   Procedure: SEPTOPLASTY;  Surgeon: Carloyn Manner, MD;  Location: [place];  Service: ENT;  Laterality: N/A;  . SPHENOIDECTOMY Left 11/30/2014   Procedure: Coralee Pesa, ;  Surgeon: Carloyn Manner, MD;  Location: [place];  Service: ENT;  Laterality: Left;  Marland Kitchen VAGINAL HYSTERECTOMY  1995   vaginal    Prior to Admission medications   Medication Sig Start Date End Date Taking? Authorizing Provider  benzonatate (TESSALON) 200 MG capsule Take 1 capsule (200 mg total) by mouth 3 (three) times daily as needed. 06/11/20 06/11/21 Yes Nur Krasinski L, PA-C  ipratropium-albuterol (DUONEB) 0.5-2.5 (3) MG/3ML SOLN Take 3 mLs by nebulization every 6 (six) hours as needed for up to 10 days (wheezing, cough or short of breath). 06/11/20 06/21/20 Yes Faven Watterson L, PA-C  albuterol (PROVENTIL) (2.5 MG/3ML) 0.083% nebulizer solution Take 3 mLs (2.5 mg total) by nebulization every 4 (four) hours as needed for wheezing or shortness of breath. 01/13/20 01/12/21  Tyler Pita, MD  albuterol (VENTOLIN HFA) 108 (90 Base) MCG/ACT inhaler Inhale 2 puffs into the lungs every 6 (six) hours as needed for wheezing or shortness of breath. 05/29/20   Birdie Sons, MD  azithromycin (ZITHROMAX) 250 MG tablet Take 2 tablets on Day 1. Then take 1 tablet daily. 06/08/20   Brunetta Jeans, PA-C  Budeson-Glycopyrrol-Formoterol (BREZTRI AEROSPHERE) 160-9-4.8 MCG/ACT AERO Inhale 2 puffs into the lungs in the morning and at bedtime. 05/13/19   Tyler Pita, MD  clonazePAM (KLONOPIN) 1 MG tablet TAKE 1 TABLET (1 MG TOTAL) BY MOUTH 2 (TWO) TIMES DAILY AS NEEDED. 03/26/20   Birdie Sons, MD  DEXILANT 60 MG capsule TAKE 1 CAPSULE (60 MG TOTAL) BY MOUTH DAILY. **PLEASE SCHEDULE 1 YEAR FOLLOW UP APPT** 04/24/20   Lucilla Lame, MD  DUREZOL 0.05 % EMUL Place 1 drop into the left eye 5 (five) times daily as needed (takes only during flares of  uveitis).  11/09/19   [provider]  fluticasone (FLONASE) 50 MCG/ACT nasal spray Place 2 sprays into both nostrils daily. 04/05/20   Birdie Sons, MD  hydroxychloroquine (PLAQUENIL) 200 MG tablet TAKE 2 TABLETS (400 MG TOTAL) BY MOUTH DAILY. FOR DISCOID LUPUS (L93.0) 09/29/19   Birdie Sons, MD  Misc. Devices (PULSE OXIMETER DELUXE) MISC Use daily to monitor oxygen levels 01/07/20   Fenton Malling M, PA-C  nitroGLYCERIN (NITROSTAT) 0.4 MG SL tablet Place 1 tablet (0.4 mg total) under the tongue every 5 (five) minutes as needed for chest pain. 12/08/14   Wellington Hampshire, MD  predniSONE (DELTASONE) 20 MG tablet Take 2 tablets (40 mg total) by mouth daily with breakfast for 5 days. 06/08/20 06/13/20  Brunetta Jeans, PA-C  propranolol (INDERAL) 20 MG tablet TAKE 1 TABLET (20 MG TOTAL) BY MOUTH 2 (TWO) TIMES DAILY. (BETA BLOCKER) 01/13/20   Birdie Sons, MD  Respiratory Therapy Supplies (FLUTTER) DEVI 1 Device by Does not apply route daily. 03/19/15   Theodoro Grist, MD  Spacer/Aero-Holding Chambers (AEROCHAMBER MV) inhaler Use as instructed 01/13/20   Tyler Pita, MD  Allergies Alum & mag hydroxide-simeth, Aspirin, Augmentin [amoxicillin-pot clavulanate], Bactrim [sulfamethoxazole-trimethoprim], Cefdinir, Chantix [varenicline], Doxycycline, Multaq [dronedarone], and Ivp dye [iodinated diagnostic agents]  Family History  Problem Relation Age of Onset  . Emphysema Father   . Asthma Father   . Heart disease Father   . Heart attack Father   . Arthritis Father        RA  . Colon cancer Father   . Hypertension Mother   . Breast cancer Neg Hx     Social History Social History   Tobacco Use  . Smoking status: Current Every Day Smoker    Packs/day: 1.00    Years: 20.00    Pack years: 20.00    Types: Cigarettes  . Smokeless tobacco: Never Used  . Tobacco comment: 1 cig today/ 01/13/2020  Vaping Use  . Vaping Use: Never used  Substance Use Topics  . Alcohol  use: No    Alcohol/week: 0.0 standard drinks  . Drug use: No    Review of Systems Constitutional: No fever/chills Eyes: No visual changes. ENT: No sore throat.  Positive nasal congestion. Cardiovascular: Denies chest pain. Respiratory: Denies shortness of breath.  Positive cough. Gastrointestinal: No abdominal pain.  No nausea, no vomiting.  No diarrhea. Musculoskeletal: Negative for musculoskeletal pain. Skin: Negative for rash. Neurological: Negative for headaches, focal weakness or numbness. ____________________________________________   PHYSICAL EXAM:  VITAL SIGNS: ED Triage Vitals  Enc Vitals Group     BP 06/11/20 1012 129/75     Pulse Rate 06/11/20 1012 96     Resp 06/11/20 1012 20     Temp 06/11/20 1012 98.2 F (36.8 C)     Temp Source 06/11/20 1012 Oral     SpO2 --      Weight --      Height --      Head Circumference --      Peak Flow --      Pain Score 06/11/20 1009 0     Pain Loc --      Pain Edu? --      Excl. in [place]? --     Constitutional: Alert and oriented. Well appearing and in no acute distress. Eyes: Conjunctivae are normal.  Head: Atraumatic. Nose: Mild congestion/rhinnorhea. Mouth/Throat: Mucous membranes are moist.  Oropharynx non-erythematous. Neck: No stridor.   Cardiovascular: Normal rate, regular rhythm. Grossly normal heart sounds.  Good peripheral circulation. Respiratory: Normal respiratory effort.  No retractions. Lungs initially has some mild expiratory wheezes throughout.  Patient is able to speak in complete sentences without any difficulty. Gastrointestinal: Soft and nontender. No distention. Musculoskeletal: Moves upper and lower extremities with any difficulty and patient is ambulatory without any assistance. Neurologic:  Normal speech and language. No gross focal neurologic deficits are appreciated. No gait instability. Skin:  Skin is warm, dry and intact. No rash noted. Psychiatric: Mood and affect are normal. Speech and behavior  are normal.  ____________________________________________   LABS (all labs ordered are listed, but only abnormal results are displayed)  Labs Reviewed - No data to display ____________________________________________  RADIOLOGY I, Johnn Hai, personally viewed and evaluated these images (plain radiographs) as part of my medical decision making, as well as reviewing the written report by the radiologist.   Official radiology report(s): DG Chest 2 View  Result Date: 06/11/2020 CLINICAL DATA:  Cough and wheezing EXAM: CHEST - 2 VIEW COMPARISON:  April 02, 2020 FINDINGS: Lungs are mildly hyperexpanded. There is no edema or airspace opacity. Heart size and  pulmonary vascularity are normal. No adenopathy. There is aortic atherosclerosis. No bone lesions. IMPRESSION: Lungs mildly hyperexpanded without edema or airspace opacity. Heart size normal. Aortic Atherosclerosis (ICD10-I70.0). Electronically Signed   By: Lowella Grip III M.D.   On: 06/11/2020 10:50    ____________________________________________   PROCEDURES  Procedure(s) performed (including Critical Care):  Procedures   ____________________________________________   INITIAL IMPRESSION / ASSESSMENT AND PLAN / ED COURSE  As part of my medical decision making, I reviewed the following data within the electronic MEDICAL RECORD NUMBER Notes from prior ED visits and [place] Controlled Substance Database  55 year old female presents to the ED with complaint of cough, nasal congestion and shortness of breath for the last 4 days.  She had an ED visit and was prescribed a Z-Pak along with oral steroids.  Patient states that she does not feel that she is getting better.  She has albuterol inhalers at home and also did an albuterol nebulizer treatment which she states helps a little.  She also complains of coughing and unable to sleep at night.  Patient was reassured when her chest x-ray did not show any pneumonia.  She is encouraged to  discontinue smoking.  At this time she will discontinue the albuterol nebulizer treatment as she got more relief with the DuoNeb in the ED and a prescription for the same was sent to her pharmacy.  A prescription for Tessalon Perles was sent to her pharmacy to be taken every 8 hours as needed for cough.  She will continue with the Z-Pak until completely finished as well as the steroids.  She is to follow-up with her PCP.  Patient is encouraged to discontinue smoking which she acknowledges.  ____________________________________________   FINAL CLINICAL IMPRESSION(S) / ED DIAGNOSES  Final diagnoses:  Upper respiratory tract infection, unspecified type  COPD exacerbation [facility])     ED Discharge Orders         Ordered    ipratropium-albuterol (DUONEB) 0.5-2.5 (3) MG/3ML SOLN  Every 6 hours PRN        06/11/20 1139    benzonatate (TESSALON) 200 MG capsule  3 times daily PRN        06/11/20 1139          *Please note:  Andrea [name] was evaluated in Emergency Department on 06/11/2020 for the symptoms described in the history of present illness. She was evaluated in the context of the global COVID-19 pandemic, which necessitated consideration that the patient might be at risk for infection with the SARS-CoV-2 virus that causes COVID-19. Institutional protocols and algorithms that pertain to the evaluation of patients at risk for COVID-19 are in a state of rapid change based on information released by regulatory bodies including the CDC and federal and state organizations. These policies and algorithms were followed during the patient's care in the ED.  Some ED evaluations and interventions may be delayed as a result of limited staffing during and the pandemic.*   Note:  This document was prepared using Dragon voice recognition software and may include unintentional dictation errors.    Johnn Hai, PA-C 06/11/20 1221    Arta Silence, MD 06/11/20 [phone]

## 2020-06-12 ENCOUNTER — Ambulatory Visit: Payer: Medicare Other | Admitting: Adult Health

## 2020-06-12 ENCOUNTER — Emergency Department: Payer: Medicare Other

## 2020-06-12 ENCOUNTER — Observation Stay
Admission: EM | Admit: 2020-06-12 | Discharge: 2020-06-13 | Disposition: A | Discharge status: Home / Self Care | Payer: Medicare Other | Attending: Internal Medicine | Admitting: Internal Medicine

## 2020-06-12 ENCOUNTER — Other Ambulatory Visit: Payer: Self-pay

## 2020-06-12 ENCOUNTER — Encounter: Payer: Self-pay | Admitting: Emergency Medicine

## 2020-06-12 DIAGNOSIS — Z20822 Contact with and (suspected) exposure to covid-19: Secondary | ICD-10-CM | POA: Insufficient documentation

## 2020-06-12 DIAGNOSIS — J449 Chronic obstructive pulmonary disease, unspecified: Secondary | ICD-10-CM | POA: Diagnosis present

## 2020-06-12 DIAGNOSIS — E785 Hyperlipidemia, unspecified: Secondary | ICD-10-CM | POA: Diagnosis not present

## 2020-06-12 DIAGNOSIS — K219 Gastro-esophageal reflux disease without esophagitis: Secondary | ICD-10-CM | POA: Diagnosis not present

## 2020-06-12 DIAGNOSIS — I10 Essential (primary) hypertension: Secondary | ICD-10-CM | POA: Diagnosis present

## 2020-06-12 DIAGNOSIS — Z8616 Personal history of COVID-19: Secondary | ICD-10-CM | POA: Diagnosis not present

## 2020-06-12 DIAGNOSIS — G2581 Restless legs syndrome: Secondary | ICD-10-CM | POA: Diagnosis not present

## 2020-06-12 DIAGNOSIS — Z720 Tobacco use: Secondary | ICD-10-CM | POA: Diagnosis present

## 2020-06-12 DIAGNOSIS — M797 Fibromyalgia: Secondary | ICD-10-CM | POA: Diagnosis present

## 2020-06-12 DIAGNOSIS — R0602 Shortness of breath: Secondary | ICD-10-CM | POA: Diagnosis not present

## 2020-06-12 DIAGNOSIS — L930 Discoid lupus erythematosus: Secondary | ICD-10-CM | POA: Diagnosis present

## 2020-06-12 DIAGNOSIS — F32A Depression, unspecified: Secondary | ICD-10-CM | POA: Diagnosis not present

## 2020-06-12 DIAGNOSIS — J9601 Acute respiratory failure with hypoxia: Secondary | ICD-10-CM

## 2020-06-12 DIAGNOSIS — Z79899 Other long term (current) drug therapy: Secondary | ICD-10-CM | POA: Diagnosis not present

## 2020-06-12 DIAGNOSIS — F17210 Nicotine dependence, cigarettes, uncomplicated: Secondary | ICD-10-CM | POA: Diagnosis not present

## 2020-06-12 DIAGNOSIS — J441 Chronic obstructive pulmonary disease with (acute) exacerbation: Secondary | ICD-10-CM | POA: Diagnosis not present

## 2020-06-12 DIAGNOSIS — F419 Anxiety disorder, unspecified: Secondary | ICD-10-CM | POA: Diagnosis not present

## 2020-06-12 LAB — BASIC METABOLIC PANEL
Anion gap: 9 (ref 5–15)
BUN: 12 mg/dL (ref 6–20)
CO2: 27 mmol/L (ref 22–32)
Calcium: 9.4 mg/dL (ref 8.9–10.3)
Chloride: 101 mmol/L (ref 98–111)
Creatinine, Ser: 0.78 mg/dL (ref 0.44–1.00)
GFR, Estimated: >60 mL/min (ref >60)
Glucose, Bld: 94 mg/dL (ref 70–99)
Potassium: 4.4 mmol/L (ref 3.5–5.1)
Sodium: 137 mmol/L (ref 135–145)

## 2020-06-12 LAB — CBC
HCT: 45.6 % (ref 36.0–46.0)
Hemoglobin: 15.1 g/dL — ABNORMAL HIGH (ref 12.0–15.0)
MCH: 26.5 pg (ref 26.0–34.0)
MCHC: 33.1 g/dL (ref 30.0–36.0)
MCV: 80 fL (ref 80.0–100.0)
Platelets: 195 10*3/uL (ref 150–400)
RBC: 5.7 MIL/uL — ABNORMAL HIGH (ref 3.87–5.11)
RDW: 14.7 % (ref 11.5–15.5)
WBC: 7.7 10*3/uL (ref 4.0–10.5)
nRBC: 0 % (ref 0.0–0.2)

## 2020-06-12 LAB — D-DIMER, QUANTITATIVE: D-Dimer, Quant: 0.82 ug/mL-FEU — ABNORMAL HIGH (ref 0.00–0.50)

## 2020-06-12 LAB — SARS CORONAVIRUS 2 (TAT 6-24 HRS): SARS Coronavirus 2: NEGATIVE

## 2020-06-12 LAB — HEMOGLOBIN A1C
Hgb A1c MFr Bld: 5.7 % — ABNORMAL HIGH (ref 4.8–5.6)
Mean Plasma Glucose: 116.89 mg/dL

## 2020-06-12 LAB — TROPONIN I (HIGH SENSITIVITY): Troponin I (High Sensitivity): 3 ng/L (ref <18)

## 2020-06-12 LAB — GLUCOSE, CAPILLARY
Glucose-Capillary: 256 mg/dL — ABNORMAL HIGH (ref 70–99)
Glucose-Capillary: 107 mg/dL — ABNORMAL HIGH (ref 70–99)

## 2020-06-12 MED ORDER — IPRATROPIUM-ALBUTEROL 0.5-2.5 (3) MG/3ML IN SOLN
3.0000 mL | Freq: Once | RESPIRATORY_TRACT | Status: AC
  Administered 2020-06-12: 3 mL via RESPIRATORY_TRACT
  Filled 2020-06-12: qty 3

## 2020-06-12 MED ORDER — PROPRANOLOL HCL 20 MG PO TABS
20.0000 mg | ORAL_TABLET | Freq: Two times a day (BID) | ORAL | Status: DC
  Administered 2020-06-13: 20 mg via ORAL
  Filled 2020-06-12 (×3): qty 1

## 2020-06-12 MED ORDER — DIFLUPREDNATE 0.05 % OP EMUL: 1.0000 [drp] | Freq: Every day | OPHTHALMIC | Status: DC | PRN

## 2020-06-12 MED ORDER — CLONAZEPAM 0.5 MG PO TABS
1.0000 mg | ORAL_TABLET | Freq: Two times a day (BID) | ORAL | Status: DC | PRN
  Administered 2020-06-12: 1 mg via ORAL
  Filled 2020-06-12: qty 2

## 2020-06-12 MED ORDER — ALBUTEROL SULFATE HFA 108 (90 BASE) MCG/ACT IN AERS: 2.0000 | INHALATION_SPRAY | Freq: Four times a day (QID) | RESPIRATORY_TRACT | Status: DC | PRN

## 2020-06-12 MED ORDER — TECHNETIUM TO 99M ALBUMIN AGGREGATED
4.0000 | Freq: Once | INTRAVENOUS | Status: AC | PRN
  Administered 2020-06-12: 4.386 via INTRAVENOUS

## 2020-06-12 MED ORDER — BUDESON-GLYCOPYRROL-FORMOTEROL 160-9-4.8 MCG/ACT IN AERO: 2.0000 | INHALATION_SPRAY | RESPIRATORY_TRACT | Status: DC

## 2020-06-12 MED ORDER — FLUTICASONE PROPIONATE 50 MCG/ACT NA SUSP
2.0000 | Freq: Every day | NASAL | Status: DC
  Administered 2020-06-13: 2 via NASAL
  Filled 2020-06-12: qty 16

## 2020-06-12 MED ORDER — ENOXAPARIN SODIUM 40 MG/0.4ML ~~LOC~~ SOLN
40.0000 mg | SUBCUTANEOUS | Status: DC
  Administered 2020-06-13: 40 mg via SUBCUTANEOUS
  Filled 2020-06-12: qty 0.4

## 2020-06-12 MED ORDER — NITROGLYCERIN 0.4 MG SL SUBL: 0.4000 mg | SUBLINGUAL_TABLET | SUBLINGUAL | Status: DC | PRN

## 2020-06-12 MED ORDER — HYDROXYCHLOROQUINE SULFATE 200 MG PO TABS
400.0000 mg | ORAL_TABLET | Freq: Every day | ORAL | Status: DC
  Filled 2020-06-12: qty 2

## 2020-06-12 MED ORDER — ONDANSETRON HCL 4 MG PO TABS
4.0000 mg | ORAL_TABLET | Freq: Four times a day (QID) | ORAL | Status: DC | PRN
Start: 2020-06-12 — End: 2020-06-13

## 2020-06-12 MED ORDER — MELATONIN 5 MG PO TABS
5.0000 mg | ORAL_TABLET | Freq: Every evening | ORAL | Status: DC | PRN
  Filled 2020-06-12: qty 1

## 2020-06-12 MED ORDER — SODIUM CHLORIDE 0.9 % IV SOLN
500.0000 mg | INTRAVENOUS | Status: DC
  Administered 2020-06-12: 500 mg via INTRAVENOUS
  Filled 2020-06-12 (×2): qty 500

## 2020-06-12 MED ORDER — INSULIN ASPART 100 UNIT/ML ~~LOC~~ SOLN: 0.0000 [IU] | Freq: Every day | SUBCUTANEOUS | Status: DC

## 2020-06-12 MED ORDER — ONDANSETRON HCL 4 MG/2ML IJ SOLN: 4.0000 mg | Freq: Four times a day (QID) | INTRAMUSCULAR | Status: DC | PRN

## 2020-06-12 MED ORDER — INSULIN ASPART 100 UNIT/ML ~~LOC~~ SOLN
0.0000 [IU] | Freq: Three times a day (TID) | SUBCUTANEOUS | Status: DC
  Administered 2020-06-12: 5 [IU] via SUBCUTANEOUS
  Administered 2020-06-13: 2 [IU] via SUBCUTANEOUS
  Filled 2020-06-12 (×2): qty 1

## 2020-06-12 MED ORDER — IPRATROPIUM-ALBUTEROL 0.5-2.5 (3) MG/3ML IN SOLN
3.0000 mL | Freq: Three times a day (TID) | RESPIRATORY_TRACT | Status: DC
  Administered 2020-06-13: 3 mL via RESPIRATORY_TRACT
  Filled 2020-06-12: qty 3

## 2020-06-12 MED ORDER — METHYLPREDNISOLONE SODIUM SUCC 125 MG IJ SOLR
125.0000 mg | Freq: Once | INTRAMUSCULAR | Status: AC
  Administered 2020-06-12: 125 mg via INTRAVENOUS
  Filled 2020-06-12: qty 2

## 2020-06-12 MED ORDER — ALBUTEROL SULFATE (2.5 MG/3ML) 0.083% IN NEBU: 2.5000 mg | INHALATION_SOLUTION | RESPIRATORY_TRACT | Status: DC | PRN

## 2020-06-12 MED ORDER — ACETAMINOPHEN 325 MG PO TABS: 650.0000 mg | ORAL_TABLET | Freq: Four times a day (QID) | ORAL | Status: DC | PRN

## 2020-06-12 MED ORDER — IPRATROPIUM-ALBUTEROL 0.5-2.5 (3) MG/3ML IN SOLN
3.0000 mL | Freq: Four times a day (QID) | RESPIRATORY_TRACT | Status: DC
  Administered 2020-06-12: 3 mL via RESPIRATORY_TRACT
  Filled 2020-06-12: qty 3

## 2020-06-12 MED ORDER — METHYLPREDNISOLONE SODIUM SUCC 125 MG IJ SOLR
60.0000 mg | Freq: Two times a day (BID) | INTRAMUSCULAR | Status: DC
  Administered 2020-06-12 – 2020-06-13 (×2): 60 mg via INTRAVENOUS
  Filled 2020-06-12 (×2): qty 2

## 2020-06-12 MED ORDER — ACETAMINOPHEN 650 MG RE SUPP: 650.0000 mg | Freq: Four times a day (QID) | RECTAL | Status: DC | PRN

## 2020-06-12 MED ORDER — IPRATROPIUM-ALBUTEROL 0.5-2.5 (3) MG/3ML IN SOLN
RESPIRATORY_TRACT | Status: AC
  Filled 2020-06-12: qty 3

## 2020-06-12 NOTE — Plan of Care (Signed)
Oriented patient to room, bathroom, nurse call, television, bed controls

## 2020-06-12 NOTE — ED Notes (Signed)
Lab called for blue-top.

## 2020-06-12 NOTE — ED Notes (Signed)
Informed RN bed assigned [id number]

## 2020-06-12 NOTE — H&P (Signed)
History and Physical   Andrea [name] NID:[id number] DOB: [birth year] DOA: 06/12/2020  PCP: Birdie Sons, MD  Patient coming from: home  I have personally briefly reviewed patient's old medical records in [place].  Chief Concern: Shortness of breath  HPI: Andrea [name] is a 55 y.o. female with medical history significant for tobacco abuse, stage II moderate COPD, history of PVCs, chronic tobacco use, tobacco dependence, baseline cough, hiatal hernia, anxiety with panic attacks, not on home oxygen at baseline, presents to the emergency department for chief concerns of shortness of breath.  She reports the shortness of breath started about 06/08/2020.  She endorses sick contacts.  She reports that she spent time on 06/02/2020 with her grandchildren.  She spent half the day playing with them and one of the 5 grandchildren initially had an upper respiratory infection.  Per her children's report, all 5 grandchildren eventually all developed the upper respiratory infection.  Patient then developed shortness of breath and upper respiratory symptoms on 06/08/2020.  She called her PCP diagnosed her with viral sinusitis and prescribed a Z-Pak and prednisone p.o.  She endorses cough that is productive with yellow phlegm.  She denies fever, chills, chest pain, abdominal pain, diarrhea, dysuria.  Social history: She lives at home with her husband and daughter.  They have no pets.  She is a current everyday tobacco user, for greater than 20 years.  At her peak she smoked 1 pack/day.  Currently she smokes half a pack per day.  She denies EtOH and recreational drug use.  Vaccination: Patient is vaccinated for COVID-19, 2 doses.  She was also diagnosed with COVID-19 infection on 03/30/2020.  ROS: Constitutional: no weight change, no fever ENT/Mouth: no sore throat, no rhinorrhea Eyes: no eye pain, no vision changes Cardiovascular: no chest pain, + dyspnea,  no edema, no palpitations Respiratory: +  cough, + sputum, no wheezing Gastrointestinal: no nausea, no vomiting, no diarrhea, no constipation Genitourinary: no urinary incontinence, no dysuria, no hematuria Musculoskeletal: no arthralgias, no myalgias Skin: no skin lesions, no pruritus, Neuro: + weakness, no loss of consciousness, no syncope Psych: no anxiety, no depression, + decrease appetite Heme/Lymph: no bruising, no bleeding  ED Course: Discussed with ED provider, patient requiring hospitalization due to acute on chronic COPD exacerbation.  Vitals in the emergency department was remarkable for temperature of 98, respiration rate of 18, heart rate 84, blood pressure 113/69, patient's SPO2 of 90% on 2 L nasal cannula.  In the emergency department she desatted to 88% on room air.  Patient was given duo nebs, 2 doses, Solu-Medrol 125 mg IV once per ED provider.  Assessment/Plan  Principal Problem:   COPD exacerbation (HCC) Active Problems:   COPD, GOLD B   Tobacco abuse   Anxiety   Depression   Discoid lupus   Essential (primary) hypertension   Fibromyalgia   GERD (gastroesophageal reflux disease)   Hyperlipidemia   Restless leg   COPD exacerbation-likely secondary to sick contacts -2 L nasal cannula to maintain SPO2 greater than 92% -Azithromycin 500 mg IV, 3 doses for anti-inflammatory benefits -Solu-Medrol 60 mg IV twice daily -Duo nebs 3 times daily scheduled -Resumed home Breztri Aerosphere, 160-9-4.8 mcg/act aero, 2 puffs inhalation in the AM and at bedtime -Albuterol nebulizer every 4 hours as needed for wheezing and shortness of breath -Admit to MedSurg, observation, without telemetry  Anxiety-with history of panic attacks -Clonazepam 1 mg p.o. twice daily as needed for anxiety resumed -Propranolol 20 mg twice  daily resumed  Hypertension-propranolol 20 mg twice daily resumed  Discoid lupus-resumed hydroxychloroquine 400 mg p.o. daily  Insomnia-melatonin 5 mg p.o. nightly as needed for  sleep  Anticipate hyperglycemia-secondary to Solu-Medrol, insulin SSI, with at bedtime coverage ordered  History of uveitis-resumed home durezol eye drops prn for uveitis flare  Tobacco abuse and tobacco dependence -Patient endorses readiness to stop tobacco use. -Tobacco cessation counseling:  1. Week one and two, smoke 9 cigarettes per day. Week 3 and 4, smoke 8 cigarettes per day. Week 5 and 6, smoke 7 cigarettes per day, continue until smoking half 5 cigarettes per day 2. Call [facility] if in need of nicotine patches to help with cessation 3. Clean all indoor clothing, sheets, blankets, and freshen textile furniture to rid the smell of cigarettes 4. Only smoke outside and wear outer covering 5. Leave cigarettes and lighters outside in separate places 6. During in-between cigarettes, if you feel the urge to smoke, use the following: stress squeezing devices/phone a trusted friend to talk you through the urge/walk in a safe environment 7. Avoid prolong interactions with individuals actively smoking cigarettes 8. If you smoke in social setting, avoid social setting where cigarette smoking is expected or considered acceptable  9. If missing the feeling of holding a cigarette, cut a sipping straw to the length of a cigarette and hold it between your fingers  Chart reviewed.   Patient tested positive for COVID-19 on 04/01/2020  DVT prophylaxis: Enoxaparin 40 mg subcutaneous every 24 hours, TED hose Code Status: Full code Diet: Heart healthy Family Communication: No Disposition Plan: Pending clinical course Consults called: None at this time Admission status: MedSurg, observation, telemetry 24 hours ordered  Past Medical History:  Diagnosis Date  . Anxiety    panic attacks, chest pain  . Arthritis   . COPD (chronic obstructive pulmonary disease) ([place])   . GERD (gastroesophageal reflux disease)   . Hypertension    not medicated  . Non-obstructive CAD in native artery    a.  cardiac cath 01/2014: showed minor irregularities, mildly elevated left ventricular end-diastolic pressure and normal ejection fraction; b. 03/2015 MV: low risk.  . Palpitations    a. 08/2016 Event Monitor: occas PVC's, two brief runs of SVT (4 and 7 beats).  Marland Kitchen PONV (postoperative nausea and vomiting)   . Symptomatic PVC's (premature ventricular contractions)   . Tobacco abuse   . Wears dentures    partial upper and lower   Past Surgical History:  Procedure Laterality Date  . CARDIAC CATHETERIZATION  01/03/14  . CARDIAC CATHETERIZATION    . CHOLECYSTECTOMY    . COLONOSCOPY WITH PROPOFOL N/A 10/05/2019   Procedure: COLONOSCOPY WITH PROPOFOL;  Surgeon: Lucilla Lame, MD;  Location: [facility];  Service: Endoscopy;  Laterality: N/A;  . ESOPHAGOGASTRODUODENOSCOPY (EGD) WITH PROPOFOL N/A 07/11/2016   Procedure: ESOPHAGOGASTRODUODENOSCOPY (EGD) WITH PROPOFOL;  Surgeon: Lucilla Lame, MD;  Location: [place];  Service: Endoscopy;  Laterality: N/A;  . ESOPHAGOGASTRODUODENOSCOPY (EGD) WITH PROPOFOL N/A 10/05/2019   Procedure: ESOPHAGOGASTRODUODENOSCOPY (EGD) WITH PROPOFOL;  Surgeon: Lucilla Lame, MD;  Location: [facility] ENDOSCOPY;  Service: Endoscopy;  Laterality: N/A;  . EYE SURGERY    . IMAGE GUIDED SINUS SURGERY N/A 11/30/2014   Procedure: IMAGE GUIDED SINUS SURGERY;  Surgeon: Carloyn Manner, MD;  Location: [place];  Service: ENT;  Laterality: N/A;  GAVE DISK TO CE CE  . MAXILLARY ANTROSTOMY Bilateral 11/30/2014   Procedure: MAXILLARY ANTROSTOMY;  Surgeon: Carloyn Manner, MD;  Location: [place];  Service: ENT;  Laterality: Bilateral;  . SEPTOPLASTY N/A 11/30/2014   Procedure: SEPTOPLASTY;  Surgeon: Carloyn Manner, MD;  Location: [place];  Service: ENT;  Laterality: N/A;  . SPHENOIDECTOMY Left 11/30/2014   Procedure: Coralee Pesa, ;  Surgeon: Carloyn Manner, MD;  Location: [place];  Service: ENT;  Laterality: Left;  Marland Kitchen VAGINAL HYSTERECTOMY   1995   vaginal   Social History:  reports that she has been smoking cigarettes. She has a 20.00 pack-year smoking history. She has never used smokeless tobacco. She reports that she does not drink alcohol and does not use drugs.  Allergies  Allergen Reactions  . Alum & Mag Hydroxide-Simeth Diarrhea and Nausea Only  . Aspirin     Nosebleed   . Augmentin [Amoxicillin-Pot Clavulanate]     Palpitations   . Bactrim [Sulfamethoxazole-Trimethoprim] Itching  . Cefdinir Other (See Comments)    tachycardia  . Chantix [Varenicline]     Bad dreams  . Doxycycline Other (See Comments)    Nausea, migraine  . Multaq [Dronedarone] Other (See Comments)    Lip/ mouth numbness/ tongue swelling  . Ivp Dye [Iodinated Diagnostic Agents] Palpitations   Family History  Problem Relation Age of Onset  . Emphysema Father   . Asthma Father   . Heart disease Father   . Heart attack Father   . Arthritis Father        RA  . Colon cancer Father   . Hypertension Mother   . Breast cancer Neg Hx    Family history: Family history reviewed and not pertinent  Prior to Admission medications   Medication Sig Start Date End Date Taking? Authorizing Provider  albuterol (PROVENTIL) (2.5 MG/3ML) 0.083% nebulizer solution Take 3 mLs (2.5 mg total) by nebulization every 4 (four) hours as needed for wheezing or shortness of breath. 01/13/20 01/12/21  Tyler Pita, MD  albuterol (VENTOLIN HFA) 108 (90 Base) MCG/ACT inhaler Inhale 2 puffs into the lungs every 6 (six) hours as needed for wheezing or shortness of breath. 05/29/20   Birdie Sons, MD  azithromycin (ZITHROMAX) 250 MG tablet Take 2 tablets on Day 1. Then take 1 tablet daily. 06/08/20   Brunetta Jeans, PA-C  benzonatate (TESSALON) 200 MG capsule Take 1 capsule (200 mg total) by mouth 3 (three) times daily as needed. 06/11/20 06/11/21  Johnn Hai, PA-C  Budeson-Glycopyrrol-Formoterol (BREZTRI AEROSPHERE) 160-9-4.8 MCG/ACT AERO Inhale 2 puffs into  the lungs in the morning and at bedtime. 05/13/19   Tyler Pita, MD  clonazePAM (KLONOPIN) 1 MG tablet TAKE 1 TABLET (1 MG TOTAL) BY MOUTH 2 (TWO) TIMES DAILY AS NEEDED. 03/26/20   Birdie Sons, MD  DEXILANT 60 MG capsule TAKE 1 CAPSULE (60 MG TOTAL) BY MOUTH DAILY. **PLEASE SCHEDULE 1 YEAR FOLLOW UP APPT** 04/24/20   Lucilla Lame, MD  DUREZOL 0.05 % EMUL Place 1 drop into the left eye 5 (five) times daily as needed (takes only during flares of uveitis).  11/09/19   [provider]  fluticasone (FLONASE) 50 MCG/ACT nasal spray Place 2 sprays into both nostrils daily. 04/05/20   Birdie Sons, MD  hydroxychloroquine (PLAQUENIL) 200 MG tablet TAKE 2 TABLETS (400 MG TOTAL) BY MOUTH DAILY. FOR DISCOID LUPUS (L93.0) 09/29/19   Birdie Sons, MD  ipratropium-albuterol (DUONEB) 0.5-2.5 (3) MG/3ML SOLN Take 3 mLs by nebulization every 6 (six) hours as needed for up to 10 days (wheezing, cough or short of breath). 06/11/20 06/21/20  Johnn Hai,  PA-C  Misc. Devices (PULSE OXIMETER DELUXE) MISC Use daily to monitor oxygen levels 01/07/20   Fenton Malling M, PA-C  nitroGLYCERIN (NITROSTAT) 0.4 MG SL tablet Place 1 tablet (0.4 mg total) under the tongue every 5 (five) minutes as needed for chest pain. 12/08/14   Wellington Hampshire, MD  predniSONE (DELTASONE) 20 MG tablet Take 2 tablets (40 mg total) by mouth daily with breakfast for 5 days. 06/08/20 06/13/20  Brunetta Jeans, PA-C  propranolol (INDERAL) 20 MG tablet TAKE 1 TABLET (20 MG TOTAL) BY MOUTH 2 (TWO) TIMES DAILY. (BETA BLOCKER) 01/13/20   Birdie Sons, MD  Respiratory Therapy Supplies (FLUTTER) DEVI 1 Device by Does not apply route daily. 03/19/15   Theodoro Grist, MD  Spacer/Aero-Holding Chambers (AEROCHAMBER MV) inhaler Use as instructed 01/13/20   Tyler Pita, MD   Physical Exam: Vitals:   06/12/20 1924 06/12/20 1951 06/12/20 1957 06/12/20 2313  BP: 120/72 125/68  106/60  Pulse: 93 85  79  Resp: 18 18  18   Temp:  97.7 F (36.5 C) (!) 97.5 F (36.4 C)  (!) 97.5 F (36.4 C)  TempSrc:  Oral  Oral  SpO2: 98% 97% 97% 96%  Weight:      Height:       Constitutional: appears age-appropriate, NAD, calm, comfortable Eyes: PERRL, lids and conjunctivae normal ENMT: Mucous membranes are moist. Posterior pharynx clear of any exudate or lesions. Age-appropriate dentition. Hearing appropriate Neck: normal, supple, no masses, no thyromegaly Respiratory: clear to auscultation bilaterally, no wheezing, no crackles. Normal respiratory effort. No accessory muscle use.  Cardiovascular: Regular rate and rhythm, no murmurs / rubs / gallops. No extremity edema. 2+ pedal pulses. No carotid bruits.  Abdomen: no tenderness, no masses palpated, no hepatosplenomegaly. Bowel sounds positive.  Musculoskeletal: no clubbing / cyanosis. No joint deformity upper and lower extremities. Good ROM, no contractures, no atrophy. Normal muscle tone.  Skin: no rashes, lesions, ulcers. No induration Neurologic: Sensation intact. Strength 5/5 in all 4.  Psychiatric: Normal judgment and insight. Alert and oriented x 3. Normal mood.   EKG: independently reviewed, showing sinus rhythm with rate of 94, QTc 428  Chest x-ray on Admission: I personally reviewed and I agree with radiologist reading as below.  DG Chest 2 View  Result Date: 06/12/2020 CLINICAL DATA:  Shortness of breath.  History of COPD. EXAM: CHEST - 2 VIEW COMPARISON:  06/11/2020 FINDINGS: The heart size and mediastinal contours are within normal limits. Stable chronic lung disease with areas of parenchymal scarring, interstitial prominence and bronchial thickening. No focal airspace consolidation, pneumothorax, pleural fluid or visible nodule. The visualized skeletal structures are unremarkable. IMPRESSION: Stable chronic lung disease. Electronically Signed   By: Aletta Edouard M.D.   On: 06/12/2020 08:25   DG Chest 2 View  Result Date: 06/11/2020 CLINICAL DATA:  Cough and  wheezing EXAM: CHEST - 2 VIEW COMPARISON:  April 02, 2020 FINDINGS: Lungs are mildly hyperexpanded. There is no edema or airspace opacity. Heart size and pulmonary vascularity are normal. No adenopathy. There is aortic atherosclerosis. No bone lesions. IMPRESSION: Lungs mildly hyperexpanded without edema or airspace opacity. Heart size normal. Aortic Atherosclerosis (ICD10-I70.0). Electronically Signed   By: Lowella Grip III M.D.   On: 06/11/2020 10:50   NM Pulmonary Perfusion  Result Date: 06/12/2020 CLINICAL DATA:  Increasing shortness of breath over the past 2-3 days. Cough since the patient was diagnosed with COVID-19 in January, 2022. EXAM: NUCLEAR MEDICINE PERFUSION LUNG SCAN TECHNIQUE: Perfusion images were  obtained in multiple projections after intravenous injection of radiopharmaceutical. Ventilation scans intentionally deferred if perfusion scan and chest x-ray adequate for interpretation during COVID 19 epidemic. RADIOPHARMACEUTICALS:  4.39 mCi Tc-64m MAA IV COMPARISON:  PA and lateral chest today. FINDINGS: No segmental or subsegmental defect is identified. Perfusion appears normal. IMPRESSION: Negative for pulmonary embolus. Electronically Signed   By: Inge Rise M.D.   On: 06/12/2020 14:11   Labs on Admission: I have personally reviewed following labs  CBC: Recent Labs  Lab 06/12/20 0719  WBC 7.7  HGB 15.1*  HCT 45.6  MCV 80.0  PLT 211   Basic Metabolic Panel: Recent Labs  Lab 06/12/20 0719  NA 137  K 4.4  CL 101  CO2 27  GLUCOSE 94  BUN 12  CREATININE 0.78  CALCIUM 9.4   GFR: Estimated Creatinine Clearance: 73.3 mL/min (by C-G formula based on SCr of 0.78 mg/dL).  HbA1C: Recent Labs    06/12/20 0719  HGBA1C 5.7*   CBG: Recent Labs  Lab 06/12/20 1643 06/12/20 2137  GLUCAP 256* 107*   Urine analysis:    Component Value Date/Time   COLORURINE STRAW (A) 11/18/2015 2148   APPEARANCEUR CLEAR (A) 11/18/2015 2148   APPEARANCEUR Clear 01/17/2014  2055   LABSPEC 1.002 (L) 11/18/2015 2148   LABSPEC 1.023 01/17/2014 2055   PHURINE 6.0 11/18/2015 2148   GLUCOSEU NEGATIVE 11/18/2015 2148   GLUCOSEU Negative 01/17/2014 2055   HGBUR NEGATIVE 11/18/2015 2148   BILIRUBINUR Negative 06/04/2019 1604   BILIRUBINUR Negative 01/17/2014 2055   KETONESUR NEGATIVE 11/18/2015 2148   PROTEINUR Negative 06/04/2019 1604   PROTEINUR NEGATIVE 11/18/2015 2148   UROBILINOGEN 0.2 06/04/2019 1604   NITRITE Negative 06/04/2019 1604   NITRITE NEGATIVE 11/18/2015 2148   LEUKOCYTESUR Negative 06/04/2019 1604   LEUKOCYTESUR Trace 01/17/2014 2055   Tedd Cottrill N Ashleigh Arya D.O. [facility]  If 7PM-7AM, please contact overnight-coverage provider If 7AM-7PM, please contact day coverage provider [email or web address]  06/12/2020, 11:18 PM

## 2020-06-12 NOTE — ED Triage Notes (Signed)
Patient to ER for c/o shortness of breath. Patient states this am after waking up she felt short of breath just walking from couch to coffee maker. Patient reports s/s feels like previous COPD exacerbation.

## 2020-06-12 NOTE — ED Notes (Signed)
Told RN bed assigned [id number]

## 2020-06-12 NOTE — ED Provider Notes (Signed)
[facility] Emergency Department Provider Note    None    (approximate)  I have reviewed the triage vital signs and the nursing notes.   HISTORY  Chief Complaint Shortness of Breath    HPI Andrea [name] is a 55 y.o. female below listed past medical history and recent COVID-19 illness back in January presents to the ER for evaluation of cough shortness of breath.  Started feeling some palpitations last night after being seen in the ER given albuterol treatments.  She started taking some of her home steroid which is left over from a previous illness.  She denies any chest pain or pressure.  No pleuritic discomfort.  Did feel she had some chills.  Denies any nausea or vomiting.  No measured temperature.    Past Medical History:  Diagnosis Date  . Anxiety    panic attacks, chest pain  . Arthritis   . COPD (chronic obstructive pulmonary disease) ([place])   . GERD (gastroesophageal reflux disease)   . Hypertension    not medicated  . Non-obstructive CAD in native artery    a. cardiac cath 01/2014: showed minor irregularities, mildly elevated left ventricular end-diastolic pressure and normal ejection fraction; b. 03/2015 MV: low risk.  . Palpitations    a. 08/2016 Event Monitor: occas PVC's, two brief runs of SVT (4 and 7 beats).  Marland Kitchen PONV (postoperative nausea and vomiting)   . Symptomatic PVC's (premature ventricular contractions)   . Tobacco abuse   . Wears dentures    partial upper and lower   Family History  Problem Relation Age of Onset  . Emphysema Father   . Asthma Father   . Heart disease Father   . Heart attack Father   . Arthritis Father        RA  . Colon cancer Father   . Hypertension Mother   . Breast cancer Neg Hx    Past Surgical History:  Procedure Laterality Date  . CARDIAC CATHETERIZATION  01/03/14  . CARDIAC CATHETERIZATION    . CHOLECYSTECTOMY    . COLONOSCOPY WITH PROPOFOL N/A 10/05/2019   Procedure: COLONOSCOPY WITH PROPOFOL;   Surgeon: Lucilla Lame, MD;  Location: [facility] ENDOSCOPY;  Service: Endoscopy;  Laterality: N/A;  . ESOPHAGOGASTRODUODENOSCOPY (EGD) WITH PROPOFOL N/A 07/11/2016   Procedure: ESOPHAGOGASTRODUODENOSCOPY (EGD) WITH PROPOFOL;  Surgeon: Lucilla Lame, MD;  Location: [place];  Service: Endoscopy;  Laterality: N/A;  . ESOPHAGOGASTRODUODENOSCOPY (EGD) WITH PROPOFOL N/A 10/05/2019   Procedure: ESOPHAGOGASTRODUODENOSCOPY (EGD) WITH PROPOFOL;  Surgeon: Lucilla Lame, MD;  Location: [facility] ENDOSCOPY;  Service: Endoscopy;  Laterality: N/A;  . EYE SURGERY    . IMAGE GUIDED SINUS SURGERY N/A 11/30/2014   Procedure: IMAGE GUIDED SINUS SURGERY;  Surgeon: Carloyn Manner, MD;  Location: [place];  Service: ENT;  Laterality: N/A;  GAVE DISK TO CE CE  . MAXILLARY ANTROSTOMY Bilateral 11/30/2014   Procedure: MAXILLARY ANTROSTOMY;  Surgeon: Carloyn Manner, MD;  Location: [place];  Service: ENT;  Laterality: Bilateral;  . SEPTOPLASTY N/A 11/30/2014   Procedure: SEPTOPLASTY;  Surgeon: Carloyn Manner, MD;  Location: [place];  Service: ENT;  Laterality: N/A;  . SPHENOIDECTOMY Left 11/30/2014   Procedure: Coralee Pesa, ;  Surgeon: Carloyn Manner, MD;  Location: [place];  Service: ENT;  Laterality: Left;  Marland Kitchen VAGINAL HYSTERECTOMY  1995   vaginal   Patient Active Problem List   Diagnosis Date Noted  . Personal history of colonic polyps   . Polyp of transverse colon   .  Varicose veins of bilateral lower extremities with pain 11/22/2016  . Restless leg 11/18/2016  . Iron deficiency 11/18/2016  . Allergic rhinitis 06/11/2016  . Hyperlipidemia 03/19/2015  . COPD exacerbation ([place]) 10/04/2014  . Anxiety 09/22/2014  . Depression 09/22/2014  . Dyshidrosis 09/22/2014  . GERD (gastroesophageal reflux disease) 09/22/2014  . Insomnia 09/22/2014  . Lung nodule, multiple 09/22/2014  . Panic disorder 09/22/2014  . Palpitations 01/17/2014  . Pulmonary nodule 06/10/2013  .  Shortness of breath 05/20/2013  . COPD, GOLD B 05/20/2013  . Tobacco abuse 05/20/2013  . Daytime somnolence 05/20/2013  . Back pain 08/28/2012  . Discoid lupus 05/30/2012  . Generalized abdominal pain 03/11/2012  . Pain in joint 03/09/2012  . Psoriasis 02/21/2012  . Fibromyalgia 02/11/2012  . Lupus ([place]) 02/11/2012  . Panic attacks 02/11/2012  . History of adenomatous polyp of colon 09/13/2011  . Heartburn 08/06/2011  . PVC's (premature ventricular contractions) 01/29/2011  . Essential (primary) hypertension 03/04/1998      Prior to Admission medications   Medication Sig Start Date End Date Taking? Authorizing Provider  albuterol (PROVENTIL) (2.5 MG/3ML) 0.083% nebulizer solution Take 3 mLs (2.5 mg total) by nebulization every 4 (four) hours as needed for wheezing or shortness of breath. 01/13/20 01/12/21  Tyler Pita, MD  albuterol (VENTOLIN HFA) 108 (90 Base) MCG/ACT inhaler Inhale 2 puffs into the lungs every 6 (six) hours as needed for wheezing or shortness of breath. 05/29/20   Birdie Sons, MD  azithromycin (ZITHROMAX) 250 MG tablet Take 2 tablets on Day 1. Then take 1 tablet daily. 06/08/20   Brunetta Jeans, PA-C  benzonatate (TESSALON) 200 MG capsule Take 1 capsule (200 mg total) by mouth 3 (three) times daily as needed. 06/11/20 06/11/21  Johnn Hai, PA-C  Budeson-Glycopyrrol-Formoterol (BREZTRI AEROSPHERE) 160-9-4.8 MCG/ACT AERO Inhale 2 puffs into the lungs in the morning and at bedtime. 05/13/19   Tyler Pita, MD  clonazePAM (KLONOPIN) 1 MG tablet TAKE 1 TABLET (1 MG TOTAL) BY MOUTH 2 (TWO) TIMES DAILY AS NEEDED. 03/26/20   Birdie Sons, MD  DEXILANT 60 MG capsule TAKE 1 CAPSULE (60 MG TOTAL) BY MOUTH DAILY. **PLEASE SCHEDULE 1 YEAR FOLLOW UP APPT** 04/24/20   Lucilla Lame, MD  DUREZOL 0.05 % EMUL Place 1 drop into the left eye 5 (five) times daily as needed (takes only during flares of uveitis).  11/09/19   [provider]  fluticasone  (FLONASE) 50 MCG/ACT nasal spray Place 2 sprays into both nostrils daily. 04/05/20   Birdie Sons, MD  hydroxychloroquine (PLAQUENIL) 200 MG tablet TAKE 2 TABLETS (400 MG TOTAL) BY MOUTH DAILY. FOR DISCOID LUPUS (L93.0) 09/29/19   Birdie Sons, MD  ipratropium-albuterol (DUONEB) 0.5-2.5 (3) MG/3ML SOLN Take 3 mLs by nebulization every 6 (six) hours as needed for up to 10 days (wheezing, cough or short of breath). 06/11/20 06/21/20  Johnn Hai, PA-C  Misc. Devices (PULSE OXIMETER DELUXE) MISC Use daily to monitor oxygen levels 01/07/20   Fenton Malling M, PA-C  nitroGLYCERIN (NITROSTAT) 0.4 MG SL tablet Place 1 tablet (0.4 mg total) under the tongue every 5 (five) minutes as needed for chest pain. 12/08/14   Wellington Hampshire, MD  predniSONE (DELTASONE) 20 MG tablet Take 2 tablets (40 mg total) by mouth daily with breakfast for 5 days. 06/08/20 06/13/20  Brunetta Jeans, PA-C  propranolol (INDERAL) 20 MG tablet TAKE 1 TABLET (20 MG TOTAL) BY MOUTH 2 (TWO) TIMES DAILY. (BETA BLOCKER)  01/13/20   Birdie Sons, MD  Respiratory Therapy Supplies (FLUTTER) DEVI 1 Device by Does not apply route daily. 03/19/15   Theodoro Grist, MD  Spacer/Aero-Holding Chambers (AEROCHAMBER MV) inhaler Use as instructed 01/13/20   Tyler Pita, MD    Allergies Alum & mag hydroxide-simeth, Aspirin, Augmentin [amoxicillin-pot clavulanate], Bactrim [sulfamethoxazole-trimethoprim], Cefdinir, Chantix [varenicline], Doxycycline, Multaq [dronedarone], and Ivp dye [iodinated diagnostic agents]    Social History Social History   Tobacco Use  . Smoking status: Current Every Day Smoker    Packs/day: 1.00    Years: 20.00    Pack years: 20.00    Types: Cigarettes  . Smokeless tobacco: Never Used  . Tobacco comment: 1 cig today/ 01/13/2020  Vaping Use  . Vaping Use: Never used  Substance Use Topics  . Alcohol use: No    Alcohol/week: 0.0 standard drinks  . Drug use: No    Review of Systems Patient  denies headaches, rhinorrhea, blurry vision, numbness, shortness of breath, chest pain, edema, cough, abdominal pain, nausea, vomiting, diarrhea, dysuria, fevers, rashes or hallucinations unless otherwise stated above in HPI. ____________________________________________   PHYSICAL EXAM:  VITAL SIGNS: Vitals:   06/12/20 1127 06/12/20 1300  BP: 116/89 113/69  Pulse: 92 83  Resp: 18 18  Temp:    SpO2: (!) 88% 95%    Constitutional: Alert and oriented.  Eyes: Conjunctivae are normal.  Head: Atraumatic. Nose: No congestion/rhinnorhea. Mouth/Throat: Mucous membranes are moist.   Neck: No stridor. Painless ROM.  Cardiovascular: Normal rate, regular rhythm. Grossly normal heart sounds.  Good peripheral circulation. Respiratory: Normal respiratory effort.  No retractions. Lungs with coarse expiratory wheeze throughout all lung fields Gastrointestinal: Soft and nontender. No distention. No abdominal bruits. No CVA tenderness. Genitourinary:  Musculoskeletal: No lower extremity tenderness nor edema.  No joint effusions. Neurologic:  Normal speech and language. No gross focal neurologic deficits are appreciated. No facial droop Skin:  Skin is warm, dry and intact. No rash noted. Psychiatric: Mood and affect are normal. Speech and behavior are normal.  ____________________________________________   LABS (all labs ordered are listed, but only abnormal results are displayed)  Results for orders placed or performed during the hospital encounter of 06/12/20 (from the past 24 hour(s))  Basic metabolic panel     Status: None   Collection Time: 06/12/20  7:19 AM  Result Value Ref Range   Sodium 137 135 - 145 mmol/L   Potassium 4.4 3.5 - 5.1 mmol/L   Chloride 101 98 - 111 mmol/L   CO2 27 22 - 32 mmol/L   Glucose, Bld 94 70 - 99 mg/dL   BUN 12 6 - 20 mg/dL   Creatinine, Ser 0.78 0.44 - 1.00 mg/dL   Calcium 9.4 8.9 - 10.3 mg/dL   GFR, Estimated >60 >60 mL/min   Anion gap 9 5 - 15  CBC      Status: Abnormal   Collection Time: 06/12/20  7:19 AM  Result Value Ref Range   WBC 7.7 4.0 - 10.5 K/uL   RBC 5.70 (H) 3.87 - 5.11 MIL/uL   Hemoglobin 15.1 (H) 12.0 - 15.0 g/dL   HCT 45.6 36.0 - 46.0 %   MCV 80.0 80.0 - 100.0 fL   MCH 26.5 26.0 - 34.0 pg   MCHC 33.1 30.0 - 36.0 g/dL   RDW 14.7 11.5 - 15.5 %   Platelets 195 150 - 400 K/uL   nRBC 0.0 0.0 - 0.2 %  Troponin I (High Sensitivity)  Status: None   Collection Time: 06/12/20  7:19 AM  Result Value Ref Range   Troponin I (High Sensitivity) 3 <18 ng/L  D-dimer, quantitative     Status: Abnormal   Collection Time: 06/12/20 10:09 AM  Result Value Ref Range   D-Dimer, Quant 0.82 (H) 0.00 - 0.50 ug/mL-FEU   ____________________________________________  EKG My review and personal interpretation at Time: 7:12   Indication: cough  Rate: 95  Rhythm: sinus Axis: normal Other: normal intervals, nonspec twave abn, no stemi ____________________________________________  RADIOLOGY  I personally reviewed all radiographic images ordered to evaluate for the above acute complaints and reviewed radiology reports and findings.  These findings were personally discussed with the patient.  Please see medical record for radiology report.  ____________________________________________   PROCEDURES  Procedure(s) performed:  Procedures    Critical Care performed: no ____________________________________________   INITIAL IMPRESSION / ASSESSMENT AND PLAN / ED COURSE  Pertinent labs & imaging results that were available during my care of the patient were reviewed by me and considered in my medical decision making (see chart for details).   DDX: Asthma, copd, CHF, pna, ptx, malignancy, Pe, anemia   Andrea [name] is a 55 y.o. who presents to the ED with presentation as described above.  Patient nontoxic-appearing with no significant hypoxia but her O2 saturations are around 93 to 95% on room air.  Her exam is consistent with diffuse  coarse wheezing consistent with COPD or acute bronchitis.  EKG is nonischemic she is denying any chest pain or pressure.  Will give nebulizer as well as steroid and reassess.  Have a lower suspicion for pneumonia given lack of fever normal white count and negative x-ray yesterday.  Lower suspicion for pneumothorax.  Doubt PE as she is not tachycardic not significantly hypoxic with diffuse wheezing on exam.  Clinical Course as of 06/12/20 1417  Mon Jun 12, 2020  0852 Patient reassessed.  Does feel much improved after albuterol treatment and steroid.  We will continue to observe. [PR]  1052 D-dimer is elevated will order VQ scan as patient has allergy to contrast. [PR]    Clinical Course User Index [PR] Merlyn Lot, MD   VQ scan shows no evidence of PE.  Presentation more consistent with acute COPD exacerbation with hypoxia.  Will discuss with hospitalist for observation further medical management.  Have discussed with the patient and available family all diagnostics and treatments performed thus far and all questions were answered to the best of my ability. The patient demonstrates understanding and agreement with plan.   The patient was evaluated in Emergency Department today for the symptoms described in the history of present illness. He/she was evaluated in the context of the global COVID-19 pandemic, which necessitated consideration that the patient might be at risk for infection with the SARS-CoV-2 virus that causes COVID-19. Institutional protocols and algorithms that pertain to the evaluation of patients at risk for COVID-19 are in a state of rapid change based on information released by regulatory bodies including the CDC and federal and state organizations. These policies and algorithms were followed during the patient's care in the ED.  As part of my medical decision making, I reviewed the following data within the [place] notes reviewed and incorporated,  Labs reviewed, notes from prior ED visits and [place] Controlled Substance Database   ____________________________________________   FINAL CLINICAL IMPRESSION(S) / ED DIAGNOSES  Final diagnoses:  COPD exacerbation ([place])  Acute respiratory failure with hypoxia ([place])  NEW MEDICATIONS STARTED DURING THIS VISIT:  New Prescriptions   No medications on file     Note:  This document was prepared using Dragon voice recognition software and may include unintentional dictation errors.    Merlyn Lot, MD 06/12/20 [phone]

## 2020-06-12 NOTE — ED Notes (Signed)
Pt O2 88% RA, placed on 2L .  sats now 94%.

## 2020-06-13 ENCOUNTER — Telehealth: Payer: Self-pay | Admitting: Pulmonary Disease

## 2020-06-13 DIAGNOSIS — J449 Chronic obstructive pulmonary disease, unspecified: Secondary | ICD-10-CM

## 2020-06-13 DIAGNOSIS — J9601 Acute respiratory failure with hypoxia: Secondary | ICD-10-CM

## 2020-06-13 DIAGNOSIS — J441 Chronic obstructive pulmonary disease with (acute) exacerbation: Secondary | ICD-10-CM | POA: Diagnosis not present

## 2020-06-13 LAB — BASIC METABOLIC PANEL
Anion gap: 8 (ref 5–15)
BUN: 15 mg/dL (ref 6–20)
CO2: 28 mmol/L (ref 22–32)
Calcium: 9.2 mg/dL (ref 8.9–10.3)
Chloride: 103 mmol/L (ref 98–111)
Creatinine, Ser: 0.67 mg/dL (ref 0.44–1.00)
GFR, Estimated: >60 mL/min (ref >60)
Glucose, Bld: 158 mg/dL — ABNORMAL HIGH (ref 70–99)
Potassium: 4.2 mmol/L (ref 3.5–5.1)
Sodium: 139 mmol/L (ref 135–145)

## 2020-06-13 LAB — CBC
HCT: 40.8 % (ref 36.0–46.0)
Hemoglobin: 13.5 g/dL (ref 12.0–15.0)
MCH: 26.6 pg (ref 26.0–34.0)
MCHC: 33.1 g/dL (ref 30.0–36.0)
MCV: 80.5 fL (ref 80.0–100.0)
Platelets: 257 10*3/uL (ref 150–400)
RBC: 5.07 MIL/uL (ref 3.87–5.11)
RDW: 14.6 % (ref 11.5–15.5)
WBC: 11.5 10*3/uL — ABNORMAL HIGH (ref 4.0–10.5)
nRBC: 0 % (ref 0.0–0.2)

## 2020-06-13 LAB — GLUCOSE, CAPILLARY: Glucose-Capillary: 156 mg/dL — ABNORMAL HIGH (ref 70–99)

## 2020-06-13 MED ORDER — CLONAZEPAM 1 MG PO TABS: 1.0000 mg | ORAL_TABLET | Freq: Two times a day (BID) | ORAL | Status: DC

## 2020-06-13 MED ORDER — PANTOPRAZOLE SODIUM 40 MG PO TBEC: 40.0000 mg | DELAYED_RELEASE_TABLET | Freq: Every day | ORAL | Status: DC

## 2020-06-13 MED ORDER — AZITHROMYCIN 500 MG PO TABS: 500.0000 mg | ORAL_TABLET | Freq: Every day | ORAL | 0 refills | Status: AC

## 2020-06-13 MED ORDER — ALBUTEROL SULFATE (2.5 MG/3ML) 0.083% IN NEBU: 2.5000 mg | INHALATION_SOLUTION | Freq: Four times a day (QID) | RESPIRATORY_TRACT | 2 refills | Status: DC | PRN

## 2020-06-13 MED ORDER — PREDNISONE 10 MG PO TABS: ORAL_TABLET | ORAL | 0 refills | Status: DC

## 2020-06-13 NOTE — Care Management Obs Status (Signed)
[facility] NOTIFICATION   Patient Details  Name: Andrea [name] MRN: [id number] Date of Birth: Feb 04, 1966   Medicare Observation Status Notification Given:  No (admitted obs less than 24 hours)    Beverly Sessions, RN 06/13/2020, 10:41 AM

## 2020-06-13 NOTE — Progress Notes (Signed)
SATURATION QUALIFICATIONS:   Patient Saturations on Room Air at Rest = 96%  Patient Saturations on Room Air while Ambulating = 93%  

## 2020-06-13 NOTE — Telephone Encounter (Signed)
Rx for albuterol solution has been sent to preferred pharmacy.  Patient is aware and voiced her understanding.  Nothing further needed at this time.

## 2020-06-13 NOTE — Discharge Summary (Signed)
Physician Discharge Summary  Andrea [name] EZM:[id number] DOB: [birth year] DOA: 06/12/2020  PCP: Birdie Sons, MD  Admit date: 06/12/2020 Discharge date: 06/13/2020  Admitted From: Home Disposition:  Home  Recommendations for Outpatient Follow-up:  1. Follow up with PCP in 1 week 2. Follow up with Pulmonology   Discharge Condition: Stable, improved CODE STATUS: Full  Diet recommendation: Heart healthy   Brief/Interim Summary: From H&P by Dr. Warren Lacy Cox: "HPI: Andrea [name] is a 55 y.o. female with medical history significant for tobacco abuse, stage II moderate COPD, history of PVCs, chronic tobacco use, tobacco dependence, baseline cough, hiatal hernia, anxiety with panic attacks, not on home oxygen at baseline, presents to the emergency department for chief concerns of shortness of breath.  She reports the shortness of breath started about 06/08/2020.  She endorses sick contacts.  She reports that she spent time on 06/02/2020 with her grandchildren.  She spent half the day playing with them and one of the 5 grandchildren initially had an upper respiratory infection.  Per her children's report, all 5 grandchildren eventually all developed the upper respiratory infection.  Patient then developed shortness of breath and upper respiratory symptoms on 06/08/2020.  She called her PCP diagnosed her with viral sinusitis and prescribed a Z-Pak and prednisone p.o.  She endorses cough that is productive with yellow phlegm.  She denies fever, chills, chest pain, abdominal pain, diarrhea, dysuria.  Social history: She lives at home with her husband and daughter.  They have no pets.  She is a current everyday tobacco user, for greater than 20 years.  At her peak she smoked 1 pack/day.  Currently she smokes half a pack per day.  She denies EtOH and recreational drug use.  Vaccination: Patient is vaccinated for COVID-19, 2 doses.  She was also diagnosed with COVID-19 infection on 03/30/2020."  D-dimer  was elevated, VQ scan negative for PE. Patient was admitted for COPD exacerbation.  She initially required 2 L of nasal cannula O2.  She was treated with IV Solu-Medrol, azithromycin, breathing treatments.  On day of discharge, patient was weaned down on oxygen needs, ambulating around the room without dyspnea, clear to auscultation bilaterally.  Discharge Diagnoses:  Principal Problem:   COPD exacerbation ([place]) Active Problems:   COPD, GOLD B   Tobacco abuse   Anxiety   Depression   Discoid lupus   Essential (primary) hypertension   Fibromyalgia   GERD (gastroesophageal reflux disease)   Hyperlipidemia   Restless leg   Acute hypoxemic respiratory failure [facility])   Discharge Instructions  Discharge Instructions    Call MD for:  difficulty breathing, headache or visual disturbances   Complete by: As directed    Call MD for:  extreme fatigue   Complete by: As directed    Call MD for:  persistant dizziness or light-headedness   Complete by: As directed    Call MD for:  persistant nausea and vomiting   Complete by: As directed    Call MD for:  severe uncontrolled pain   Complete by: As directed    Call MD for:  temperature >100.4   Complete by: As directed    Diet - low sodium heart healthy   Complete by: As directed    Discharge instructions   Complete by: As directed    You were cared for by a hospitalist during your hospital stay. If you have any questions about your discharge medications or the care you received while you were in the  hospital after you are discharged, you can call the unit and ask to speak with the hospitalist on call if the hospitalist that took care of you is not available. Once you are discharged, your primary care physician will handle any further medical issues. Please note that NO REFILLS for any discharge medications will be authorized once you are discharged, as it is imperative that you return to your primary care physician (or establish a relationship  with a primary care physician if you do not have one) for your aftercare needs so that they can reassess your need for medications and monitor your lab values.   Increase activity slowly   Complete by: As directed      Allergies as of 06/13/2020      Reactions   Alum & Mag Hydroxide-simeth Diarrhea, Nausea Only   Aspirin    Nosebleed    Augmentin [amoxicillin-pot Clavulanate]    Palpitations   Bactrim [sulfamethoxazole-trimethoprim] Itching   Cefdinir Other (See Comments)   tachycardia   Chantix [varenicline]    Bad dreams   Doxycycline Other (See Comments)   Nausea, migraine   Multaq [dronedarone] Other (See Comments)   Lip/ mouth numbness/ tongue swelling   Ivp Dye [iodinated Diagnostic Agents] Palpitations      Medication List    TAKE these medications   AeroChamber MV inhaler Use as instructed   albuterol (2.5 MG/3ML) 0.083% nebulizer solution Commonly known as: PROVENTIL Take 3 mLs (2.5 mg total) by nebulization every 4 (four) hours as needed for wheezing or shortness of breath.   albuterol 108 (90 Base) MCG/ACT inhaler Commonly known as: VENTOLIN HFA Inhale 2 puffs into the lungs every 6 (six) hours as needed for wheezing or shortness of breath.   azithromycin 500 MG tablet Commonly known as: Zithromax Take 1 tablet (500 mg total) by mouth daily for 3 days. What changed:   medication strength  how much to take  how to take this  when to take this  additional instructions   benzonatate 200 MG capsule Commonly known as: TESSALON Take 1 capsule (200 mg total) by mouth 3 (three) times daily as needed.   Breztri Aerosphere 160-9-4.8 MCG/ACT Aero Generic drug: Budeson-Glycopyrrol-Formoterol Inhale 2 puffs into the lungs in the morning and at bedtime.   clonazePAM 1 MG tablet Commonly known as: KLONOPIN TAKE 1 TABLET (1 MG TOTAL) BY MOUTH 2 (TWO) TIMES DAILY AS NEEDED. What changed: when to take this   Dexilant 60 MG capsule Generic drug:  dexlansoprazole TAKE 1 CAPSULE (60 MG TOTAL) BY MOUTH DAILY. **PLEASE SCHEDULE 1 YEAR FOLLOW UP APPT**   fluticasone 50 MCG/ACT nasal spray Commonly known as: FLONASE Place 2 sprays into both nostrils daily.   Flutter Devi 1 Device by Does not apply route daily.   hydroxychloroquine 200 MG tablet Commonly known as: PLAQUENIL TAKE 2 TABLETS (400 MG TOTAL) BY MOUTH DAILY. FOR DISCOID LUPUS (L93.0)   ipratropium-albuterol 0.5-2.5 (3) MG/3ML Soln Commonly known as: DUONEB Take 3 mLs by nebulization every 6 (six) hours as needed for up to 10 days (wheezing, cough or short of breath).   nitroGLYCERIN 0.4 MG SL tablet Commonly known as: NITROSTAT Place 1 tablet (0.4 mg total) under the tongue every 5 (five) minutes as needed for chest pain.   predniSONE 10 MG tablet Commonly known as: DELTASONE Take 4 tabs for 3 days, then 3 tabs for 3 days, then 2 tabs for 3 days, then 1 tab for 3 days, then 1/2 tab for 4 days.  What changed:   medication strength  how much to take  how to take this  when to take this  additional instructions   propranolol 20 MG tablet Commonly known as: INDERAL TAKE 1 TABLET (20 MG TOTAL) BY MOUTH 2 (TWO) TIMES DAILY. (BETA BLOCKER) What changed: additional instructions   Pulse Oximeter Deluxe Misc Use daily to monitor oxygen levels       Follow-up Information    Birdie Sons, MD Follow up.   Specialty: Family Medicine Contact information: [address] [place] [address] [phone]        Melvenia Needles, NP. Schedule an appointment as soon as possible for a visit in 1 week(s).   Specialty: Pulmonary Disease Contact information: [place] [address] [phone]              Allergies  Allergen Reactions  . Alum & Mag Hydroxide-Simeth Diarrhea and Nausea Only  . Aspirin     Nosebleed   . Augmentin [Amoxicillin-Pot Clavulanate]     Palpitations   . Bactrim  [Sulfamethoxazole-Trimethoprim] Itching  . Cefdinir Other (See Comments)    tachycardia  . Chantix [Varenicline]     Bad dreams  . Doxycycline Other (See Comments)    Nausea, migraine  . Multaq [Dronedarone] Other (See Comments)    Lip/ mouth numbness/ tongue swelling  . Ivp Dye [Iodinated Diagnostic Agents] Palpitations    Consultations:  None    Procedures/Studies: DG Chest 2 View  Result Date: 06/12/2020 CLINICAL DATA:  Shortness of breath.  History of COPD. EXAM: CHEST - 2 VIEW COMPARISON:  06/11/2020 FINDINGS: The heart size and mediastinal contours are within normal limits. Stable chronic lung disease with areas of parenchymal scarring, interstitial prominence and bronchial thickening. No focal airspace consolidation, pneumothorax, pleural fluid or visible nodule. The visualized skeletal structures are unremarkable. IMPRESSION: Stable chronic lung disease. Electronically Signed   By: Aletta Edouard M.D.   On: 06/12/2020 08:25   DG Chest 2 View  Result Date: 06/11/2020 CLINICAL DATA:  Cough and wheezing EXAM: CHEST - 2 VIEW COMPARISON:  April 02, 2020 FINDINGS: Lungs are mildly hyperexpanded. There is no edema or airspace opacity. Heart size and pulmonary vascularity are normal. No adenopathy. There is aortic atherosclerosis. No bone lesions. IMPRESSION: Lungs mildly hyperexpanded without edema or airspace opacity. Heart size normal. Aortic Atherosclerosis (ICD10-I70.0). Electronically Signed   By: Lowella Grip III M.D.   On: 06/11/2020 10:50   NM Pulmonary Perfusion  Result Date: 06/12/2020 CLINICAL DATA:  Increasing shortness of breath over the past 2-3 days. Cough since the patient was diagnosed with COVID-19 in January, 2022. EXAM: NUCLEAR MEDICINE PERFUSION LUNG SCAN TECHNIQUE: Perfusion images were obtained in multiple projections after intravenous injection of radiopharmaceutical. Ventilation scans intentionally deferred if perfusion scan and chest x-ray adequate for  interpretation during COVID 19 epidemic. RADIOPHARMACEUTICALS:  4.39 mCi Tc-47m MAA IV COMPARISON:  PA and lateral chest today. FINDINGS: No segmental or subsegmental defect is identified. Perfusion appears normal. IMPRESSION: Negative for pulmonary embolus. Electronically Signed   By: Inge Rise M.D.   On: 06/12/2020 14:11      Discharge Exam: Vitals:   06/13/20 0723 06/13/20 0745  BP:  126/62  Pulse:  95  Resp:  16  Temp:  97.8 F (36.6 C)  SpO2: 96% 93%    General: Pt is alert, awake, not in acute distress Cardiovascular: RRR, S1/S2 +, no edema Respiratory: CTA bilaterally, no wheezing, no rhonchi,  no respiratory distress, no conversational dyspnea  Abdominal: Soft, NT, ND, bowel sounds + Extremities: no edema, no cyanosis Psych: Normal mood and affect, stable judgement and insight     The results of significant diagnostics from this hospitalization (including imaging, microbiology, ancillary and laboratory) are listed below for reference.     Microbiology: Recent Results (from the past 240 hour(s))  SARS CORONAVIRUS 2 (TAT 6-24 HRS) Nasopharyngeal Nasopharyngeal Swab     Status: None   Collection Time: 06/12/20  3:02 PM   Specimen: Nasopharyngeal Swab  Result Value Ref Range Status   SARS Coronavirus 2 NEGATIVE NEGATIVE Final    Comment: (NOTE) SARS-CoV-2 target nucleic acids are NOT DETECTED.  The SARS-CoV-2 RNA is generally detectable in upper and lower respiratory specimens during the acute phase of infection. Negative results do not preclude SARS-CoV-2 infection, do not rule out co-infections with other pathogens, and should not be used as the sole basis for treatment or other patient management decisions. Negative results must be combined with clinical observations, patient history, and epidemiological information. The expected result is Negative.  Fact Sheet for Patients: SugarRoll.be  Fact Sheet for Healthcare  Providers: https://www.woods-mathews.com/  This test is not yet approved or cleared by the [place] FDA and  has been authorized for detection and/or diagnosis of SARS-CoV-2 by FDA under an Emergency Use Authorization (EUA). This EUA will remain  in effect (meaning this test can be used) for the duration of the COVID-19 declaration under Se ction 564(b)(1) of the Act, 21 U.S.C. section 360bbb-3(b)(1), unless the authorization is terminated or revoked sooner.  Performed at [facility], [place] [address] [place], [place] [address]      Labs: BNP (last 3 results) No results for input(s): BNP in the last 8760 hours. Basic Metabolic Panel: Recent Labs  Lab 06/12/20 0719 06/13/20 0521  NA 137 139  K 4.4 4.2  CL 101 103  CO2 27 28  GLUCOSE 94 158*  BUN 12 15  CREATININE 0.78 0.67  CALCIUM 9.4 9.2   Liver Function Tests: No results for input(s): AST, ALT, ALKPHOS, BILITOT, PROT, ALBUMIN in the last 168 hours. No results for input(s): LIPASE, AMYLASE in the last 168 hours. No results for input(s): AMMONIA in the last 168 hours. CBC: Recent Labs  Lab 06/12/20 0719 06/13/20 0521  WBC 7.7 11.5*  HGB 15.1* 13.5  HCT 45.6 40.8  MCV 80.0 80.5  PLT 195 257   Cardiac Enzymes: No results for input(s): CKTOTAL, CKMB, CKMBINDEX, TROPONINI in the last 168 hours. BNP: Invalid input(s): POCBNP CBG: Recent Labs  Lab 06/12/20 1643 06/12/20 2137 06/13/20 0747  GLUCAP 256* 107* 156*   D-Dimer Recent Labs    06/12/20 1009  DDIMER 0.82*   Hgb A1c Recent Labs    06/12/20 0719  HGBA1C 5.7*   Lipid Profile No results for input(s): CHOL, HDL, LDLCALC, TRIG, CHOLHDL, LDLDIRECT in the last 72 hours. Thyroid function studies No results for input(s): TSH, T4TOTAL, T3FREE, THYROIDAB in the last 72 hours.  Invalid input(s): FREET3 Anemia work up No results for input(s): VITAMINB12, FOLATE, FERRITIN, TIBC, IRON, RETICCTPCT in the last 72  hours. Urinalysis    Component Value Date/Time   COLORURINE STRAW (A) 11/18/2015 2148   APPEARANCEUR CLEAR (A) 11/18/2015 2148   APPEARANCEUR Clear 01/17/2014 2055   LABSPEC 1.002 (L) 11/18/2015 2148   LABSPEC 1.023 01/17/2014 2055   PHURINE 6.0 11/18/2015 2148   GLUCOSEU NEGATIVE 11/18/2015 2148   GLUCOSEU Negative 01/17/2014 2055  HGBUR NEGATIVE 11/18/2015 2148   BILIRUBINUR Negative 06/04/2019 1604   BILIRUBINUR Negative 01/17/2014 2055   KETONESUR NEGATIVE 11/18/2015 2148   PROTEINUR Negative 06/04/2019 [place] 11/18/2015 2148   UROBILINOGEN 0.2 06/04/2019 1604   NITRITE Negative 06/04/2019 1604   NITRITE NEGATIVE 11/18/2015 2148   LEUKOCYTESUR Negative 06/04/2019 1604   LEUKOCYTESUR Trace 01/17/2014 2055   Sepsis Labs Invalid input(s): PROCALCITONIN,  WBC,  LACTICIDVEN Microbiology Recent Results (from the past 240 hour(s))  SARS CORONAVIRUS 2 (TAT 6-24 HRS) Nasopharyngeal Nasopharyngeal Swab     Status: None   Collection Time: 06/12/20  3:02 PM   Specimen: Nasopharyngeal Swab  Result Value Ref Range Status   SARS Coronavirus 2 NEGATIVE NEGATIVE Final    Comment: (NOTE) SARS-CoV-2 target nucleic acids are NOT DETECTED.  The SARS-CoV-2 RNA is generally detectable in upper and lower respiratory specimens during the acute phase of infection. Negative results do not preclude SARS-CoV-2 infection, do not rule out co-infections with other pathogens, and should not be used as the sole basis for treatment or other patient management decisions. Negative results must be combined with clinical observations, patient history, and epidemiological information. The expected result is Negative.  Fact Sheet for Patients: SugarRoll.be  Fact Sheet for Healthcare Providers: https://www.woods-mathews.com/  This test is not yet approved or cleared by the [place] FDA and  has been authorized for detection and/or  diagnosis of SARS-CoV-2 by FDA under an Emergency Use Authorization (EUA). This EUA will remain  in effect (meaning this test can be used) for the duration of the COVID-19 declaration under Se ction 564(b)(1) of the Act, 21 U.S.C. section 360bbb-3(b)(1), unless the authorization is terminated or revoked sooner.  Performed at [facility], [place] [address] [place], [place] [address]      Patient was seen and examined on the day of discharge and was found to be in stable condition. Time coordinating discharge: 30 minutes including assessment and coordination of care, as well as examination of the patient.   SIGNED:  Dessa Phi, DO [facility] 06/13/2020, 10:15 AM

## 2020-06-13 NOTE — Telephone Encounter (Signed)
Spoke to patient, who is requesting order for neb tubing and supplies. Order has been placed to [facility].  Patient stated that she was prescribed duoneb yesterday at ED, however this medication is not covered by insurance.  She is questioning if there is an alternative to duoneb.  Dr. Patsey Berthold, please advise. Thanks

## 2020-06-13 NOTE — Telephone Encounter (Signed)
Lets try albuterol neb solution 0.083% 1 vial via neb q 6 h prn.

## 2020-06-13 NOTE — Discharge Instructions (Signed)
Chronic Obstructive Pulmonary Disease  Chronic obstructive pulmonary disease (COPD) is a long-term (chronic) lung problem. When you have COPD, it is hard for air to get in and out of your lungs. Usually the condition gets worse over time, and your lungs will never return to normal. There are things you can do to keep yourself as healthy as possible. What are the causes?  Smoking. This is the most common cause.  Certain genes passed from parent to child (inherited). What increases the risk?  Being exposed to secondhand smoke from cigarettes, pipes, or cigars.  Being exposed to chemicals and other irritants, such as fumes and dust in the work environment.  Having chronic lung conditions or infections. What are the signs or symptoms?  Shortness of breath, especially during physical activity.  A long-term cough with a large amount of thick mucus. Sometimes, the cough may not have any mucus (dry cough).  Wheezing.  Breathing quickly.  Skin that looks gray or blue, especially in the fingers, toes, or lips.  Feeling tired (fatigue).  Weight loss.  Chest tightness.  Having infections often.  Episodes when breathing symptoms become much worse (exacerbations). At the later stages of this disease, you may have swelling in the ankles, feet, or legs. How is this treated?  Taking medicines.  Quitting smoking, if you smoke.  Rehabilitation. This includes steps to make your body work better. It may involve a team of specialists.  Doing exercises.  Making changes to your diet.  Using oxygen.  Lung surgery.  Lung transplant.  Comfort measures (palliative care). Follow these instructions at home: Medicines  Take over-the-counter and prescription medicines only as told by your doctor.  Talk to your doctor before taking any cough or allergy medicines. You may need to avoid medicines that cause your lungs to be dry. Lifestyle  If you smoke, stop smoking. Smoking makes  the problem worse.  Do not smoke or use any products that contain nicotine or tobacco. If you need help quitting, ask your doctor.  Avoid being around things that make your breathing worse. This may include smoke, chemicals, and fumes.  Stay active, but remember to rest as well.  Learn and use tips on how to manage stress and control your breathing.  Make sure you get enough sleep. Most adults need at least 7 hours of sleep every night.  Eat healthy foods. Eat smaller meals more often. Rest before meals. Controlled breathing Learn and use tips on how to control your breathing as told by your doctor. Try:  Breathing in (inhaling) through your nose for 1 second. Then, pucker your lips and breath out (exhale) through your lips for 2 seconds.  Putting one hand on your belly (abdomen). Breathe in slowly through your nose for 1 second. Your hand on your belly should move out. Pucker your lips and breathe out slowly through your lips. Your hand on your belly should move in as you breathe out.   Controlled coughing Learn and use controlled coughing to clear mucus from your lungs. Follow these steps: 1. Lean your head a little forward. 2. Breathe in deeply. 3. Try to hold your breath for 3 seconds. 4. Keep your mouth slightly open while coughing 2 times. 5. Spit any mucus out into a tissue. 6. Rest and do the steps again 1 or 2 times as needed. General instructions  Make sure you get all the shots (vaccines) that your doctor recommends. Ask your doctor about a flu shot and a pneumonia  shot.  Use oxygen therapy and pulmonary rehabilitation if told by your doctor. If you need home oxygen therapy, ask your doctor if you should buy a tool to measure your oxygen level (oximeter).  Make a COPD action plan with your doctor. This helps you to know what to do if you feel worse than usual.  Manage any other conditions you have as told by your doctor.  Avoid going outside when it is very hot, cold,  or humid.  Avoid people who have a sickness you can catch (contagious).  Keep all follow-up visits. Contact a doctor if:  You cough up more mucus than usual.  There is a change in the color or thickness of the mucus.  It is harder to breathe than usual.  Your breathing is faster than usual.  You have trouble sleeping.  You need to use your medicines more often than usual.  You have trouble doing your normal activities such as getting dressed or walking around the house. Get help right away if:  You have shortness of breath while resting.  You have shortness of breath that stops you from: ? Being able to talk. ? Doing normal activities.  Your chest hurts for longer than 5 minutes.  Your skin color is more blue than usual.  Your pulse oximeter shows that you have low oxygen for longer than 5 minutes.  You have a fever.  You feel too tired to breathe normally. These symptoms may represent a serious problem that is an emergency. Do not wait to see if the symptoms will go away. Get medical help right away. Call your local emergency services (911 in the [place] Do not drive yourself to the hospital. Summary  Chronic obstructive pulmonary disease (COPD) is a long-term lung problem.  The way your lungs work will never return to normal. Usually the condition gets worse over time. There are things you can do to keep yourself as healthy as possible.  Take over-the-counter and prescription medicines only as told by your doctor.  If you smoke, stop. Smoking makes the problem worse. This information is not intended to replace advice given to you by your health care provider. Make sure you discuss any questions you have with your health care provider. Document Revised: 12/28/2019 Document Reviewed: 12/28/2019 Elsevier Patient Education  2021 [employer].    Smoking Tobacco Information, Adult Smoking tobacco can be harmful to your health. Tobacco contains a poisonous (toxic),  colorless chemical called nicotine. Nicotine is addictive. It changes the brain and can make it hard to stop smoking. Tobacco also has other toxic chemicals that can hurt your body and raise your risk of many cancers. How can smoking tobacco affect me? Smoking tobacco puts you at risk for:  Cancer. Smoking is most commonly associated with lung cancer, but can also lead to cancer in other parts of the body.  Chronic obstructive pulmonary disease (COPD). This is a long-term lung condition that makes it hard to breathe. It also gets worse over time.  High blood pressure (hypertension), heart disease, stroke, or heart attack.  Lung infections, such as pneumonia.  Cataracts. This is when the lenses in the eyes become clouded.  Digestive problems. This may include peptic ulcers, heartburn, and gastroesophageal reflux disease (GERD).  Oral health problems, such as gum disease and tooth loss.  Loss of taste and smell. Smoking can affect your appearance by causing:  Wrinkles.  Yellow or stained teeth, fingers, and fingernails. Smoking tobacco can also affect your social  life, because:  It may be challenging to find places to smoke when away from home. Many workplaces, [employer], hotels, and public places are tobacco-free.  Smoking is expensive. This is due to the cost of tobacco and the long-term costs of treating health problems from smoking.  Secondhand smoke may affect those around you. Secondhand smoke can cause lung cancer, breathing problems, and heart disease. Children of smokers have a higher risk for: ? Sudden infant death syndrome (SIDS). ? Ear infections. ? Lung infections. If you currently smoke tobacco, quitting now can help you:  Lead a longer and healthier life.  Look, smell, breathe, and feel better over time.  Save money.  Protect others from the harms of secondhand smoke. What actions can I take to prevent health problems? Quit smoking  Do not start smoking.  Quit if you already do.  Make a plan to quit smoking and commit to it. Look for programs to help you and ask your health care provider for recommendations and ideas.  Set a date and write down all the reasons you want to quit.  Let your friends and family know you are quitting so they can help and support you. Consider finding friends who also want to quit. It can be easier to quit with someone else, so that you can support each other.  Talk with your health care provider about using nicotine replacement medicines to help you quit, such as gum, lozenges, patches, sprays, or pills.  Do not replace cigarette smoking with electronic cigarettes, which are commonly called e-cigarettes. The safety of e-cigarettes is not known, and some may contain harmful chemicals.  If you try to quit but return to smoking, stay positive. It is common to slip up when you first quit, so take it one day at a time.  Be prepared for cravings. When you feel the urge to smoke, chew gum or suck on hard candy.   Lifestyle  Stay busy and take care of your body.  Drink enough fluid to keep your urine pale yellow.  Get plenty of exercise and eat a healthy diet. This can help prevent weight gain after quitting.  Monitor your eating habits. Quitting smoking can cause you to have a larger appetite than when you smoke.  Find ways to relax. Go out with friends or family to a movie or a restaurant where people do not smoke.  Ask your health care provider about having regular tests (screenings) to check for cancer. This may include blood tests, imaging tests, and other tests.  Find ways to manage your stress, such as meditation, yoga, or exercise. Where to find support To get support to quit smoking, consider:  Asking your health care provider for more information and resources.  Taking classes to learn more about quitting smoking.  Looking for local organizations that offer resources about quitting smoking.  Joining  a support group for people who want to quit smoking in your local community.  Calling the smokefree.gov counselor helpline: 1-800-Quit-Now (360)548-2182) Where to find more information You may find more information about quitting smoking from:  HelpGuide.org: www.helpguide.org  https://hall.com/: smokefree.gov  American Lung Association: www.lung.org Contact a health care provider if you:  Have problems breathing.  Notice that your lips, nose, or fingers turn blue.  Have chest pain.  Are coughing up blood.  Feel faint or you pass out.  Have other health changes that cause you to worry. Summary  Smoking tobacco can negatively affect your health, the health of those around  you, your finances, and your social life.  Do not start smoking. Quit if you already do. If you need help quitting, ask your health care provider.  Think about joining a support group for people who want to quit smoking in your local community. There are many effective programs that will help you to quit this behavior. This information is not intended to replace advice given to you by your health care provider. Make sure you discuss any questions you have with your health care provider. Document Revised: 11/13/2018 Document Reviewed: 03/05/2016 Elsevier Patient Education  2021 [employer].

## 2020-06-13 NOTE — Plan of Care (Signed)
Axox4. Calm and cooperative and able to voice her needs. Pt on cardiac and continuous pulse ox monitoring. Pt currently on 1L [place] 02 sat remained greater than 92%. Vitals stable. Occasional nonproductive coughs noted. Safety measures in place. Will continue to monitor.  Problem: Education: Goal: Knowledge of General Education information will improve Description: Including pain rating scale, medication(s)/side effects and non-pharmacologic comfort measures Outcome: Progressing   Problem: Health Behavior/Discharge Planning: Goal: Ability to manage health-related needs will improve Outcome: Progressing   Problem: Clinical Measurements: Goal: Ability to maintain clinical measurements within normal limits will improve Outcome: Progressing Goal: Will remain free from infection Outcome: Progressing Goal: Diagnostic test results will improve Outcome: Progressing Goal: Respiratory complications will improve Outcome: Progressing Goal: Cardiovascular complication will be avoided Outcome: Progressing   Problem: Activity: Goal: Risk for activity intolerance will decrease Outcome: Progressing   Problem: Nutrition: Goal: Adequate nutrition will be maintained Outcome: Progressing   Problem: Coping: Goal: Level of anxiety will decrease Outcome: Progressing   Problem: Elimination: Goal: Will not experience complications related to bowel motility Outcome: Progressing Goal: Will not experience complications related to urinary retention Outcome: Progressing   Problem: Pain Managment: Goal: General experience of comfort will improve Outcome: Progressing   Problem: Safety: Goal: Ability to remain free from injury will improve Outcome: Progressing   Problem: Safety: Goal: Ability to remain free from injury will improve Outcome: Progressing   Problem: Skin Integrity: Goal: Risk for impaired skin integrity will decrease Outcome: Progressing

## 2020-06-13 NOTE — Progress Notes (Signed)
Andrea [name] to be D/C'd home per MD order.  Discussed prescriptions and follow up appointments with the patient. Prescriptions given to patient, medication list explained in detail. Pt verbalized understanding.  Allergies as of 06/13/2020       Reactions   Alum & Mag Hydroxide-simeth Diarrhea, Nausea Only   Aspirin    Nosebleed    Augmentin [amoxicillin-pot Clavulanate]    Palpitations   Bactrim [sulfamethoxazole-trimethoprim] Itching   Cefdinir Other (See Comments)   tachycardia   Chantix [varenicline]    Bad dreams   Doxycycline Other (See Comments)   Nausea, migraine   Multaq [dronedarone] Other (See Comments)   Lip/ mouth numbness/ tongue swelling   Ivp Dye [iodinated Diagnostic Agents] Palpitations        Medication List     TAKE these medications    AeroChamber MV inhaler Use as instructed   albuterol (2.5 MG/3ML) 0.083% nebulizer solution Commonly known as: PROVENTIL Take 3 mLs (2.5 mg total) by nebulization every 4 (four) hours as needed for wheezing or shortness of breath.   albuterol 108 (90 Base) MCG/ACT inhaler Commonly known as: VENTOLIN HFA Inhale 2 puffs into the lungs every 6 (six) hours as needed for wheezing or shortness of breath.   azithromycin 500 MG tablet Commonly known as: Zithromax Take 1 tablet (500 mg total) by mouth daily for 3 days. What changed:  medication strength how much to take how to take this when to take this additional instructions   benzonatate 200 MG capsule Commonly known as: TESSALON Take 1 capsule (200 mg total) by mouth 3 (three) times daily as needed.   Breztri Aerosphere 160-9-4.8 MCG/ACT Aero Generic drug: Budeson-Glycopyrrol-Formoterol Inhale 2 puffs into the lungs in the morning and at bedtime.   clonazePAM 1 MG tablet Commonly known as: KLONOPIN TAKE 1 TABLET (1 MG TOTAL) BY MOUTH 2 (TWO) TIMES DAILY AS NEEDED. What changed: when to take this   Dexilant 60 MG capsule Generic drug:  dexlansoprazole TAKE 1 CAPSULE (60 MG TOTAL) BY MOUTH DAILY. **PLEASE SCHEDULE 1 YEAR FOLLOW UP APPT**   fluticasone 50 MCG/ACT nasal spray Commonly known as: FLONASE Place 2 sprays into both nostrils daily.   Flutter Devi 1 Device by Does not apply route daily.   hydroxychloroquine 200 MG tablet Commonly known as: PLAQUENIL TAKE 2 TABLETS (400 MG TOTAL) BY MOUTH DAILY. FOR DISCOID LUPUS (L93.0)   ipratropium-albuterol 0.5-2.5 (3) MG/3ML Soln Commonly known as: DUONEB Take 3 mLs by nebulization every 6 (six) hours as needed for up to 10 days (wheezing, cough or short of breath).   nitroGLYCERIN 0.4 MG SL tablet Commonly known as: NITROSTAT Place 1 tablet (0.4 mg total) under the tongue every 5 (five) minutes as needed for chest pain.   predniSONE 10 MG tablet Commonly known as: DELTASONE Take 4 tabs for 3 days, then 3 tabs for 3 days, then 2 tabs for 3 days, then 1 tab for 3 days, then 1/2 tab for 4 days. What changed:  medication strength how much to take how to take this when to take this additional instructions   propranolol 20 MG tablet Commonly known as: INDERAL TAKE 1 TABLET (20 MG TOTAL) BY MOUTH 2 (TWO) TIMES DAILY. (BETA BLOCKER) What changed: additional instructions   Pulse Oximeter Deluxe Misc Use daily to monitor oxygen levels        Vitals:   06/13/20 0723 06/13/20 0745  BP:  126/62  Pulse:  95  Resp:  16  Temp:  97.8  F (36.6 C)  SpO2: 96% 93%    Skin clean, dry and intact without evidence of skin break down, no evidence of skin tears noted. IV catheter discontinued intact. Site without signs and symptoms of complications. Dressing and pressure applied. Pt denies pain at this time. No complaints noted.  An After Visit Summary was printed and given to the patient. Patient escorted via [place], and D/C home via private auto.  [place] C. Deatra Ina

## 2020-06-20 ENCOUNTER — Encounter: Payer: Self-pay | Admitting: Internal Medicine

## 2020-06-20 ENCOUNTER — Ambulatory Visit (INDEPENDENT_AMBULATORY_CARE_PROVIDER_SITE_OTHER): Payer: Medicare Other | Admitting: Internal Medicine

## 2020-06-20 ENCOUNTER — Other Ambulatory Visit: Payer: Self-pay

## 2020-06-20 VITALS — BP 133/75 | HR 94 | Ht 62.0 in | Wt 152.3 lb

## 2020-06-20 DIAGNOSIS — Z720 Tobacco use: Secondary | ICD-10-CM

## 2020-06-20 DIAGNOSIS — I493 Ventricular premature depolarization: Secondary | ICD-10-CM | POA: Diagnosis not present

## 2020-06-20 DIAGNOSIS — I10 Essential (primary) hypertension: Secondary | ICD-10-CM | POA: Diagnosis not present

## 2020-06-20 DIAGNOSIS — J9601 Acute respiratory failure with hypoxia: Secondary | ICD-10-CM

## 2020-06-20 NOTE — Assessment & Plan Note (Signed)
Patient is known to have a isolated PVC there is no history of ventricular tachycardia or passing out spell.

## 2020-06-20 NOTE — Assessment & Plan Note (Signed)

## 2020-06-20 NOTE — Assessment & Plan Note (Signed)
-   I instructed the patient to stop smoking and provided them with smoking cessation materials.  - I informed the patient that smoking puts them at increased risk for cancer, COPD, hypertension, and more.  - Informed the patient to seek help if they begin to have trouble breathing, develop chest pain, start to cough up blood, feel faint, or pass out.  

## 2020-06-20 NOTE — Assessment & Plan Note (Signed)
Patient was admitted to the hospital with hypoxia and wheezing.  Now she is better.  She was advised to quit smoking.

## 2020-06-20 NOTE — Progress Notes (Signed)
Established Patient Office Visit  Subjective:  Patient ID: Andrea [name], female    DOB: 1965/03/09  Age: 55 y.o. MRN: [id number]  CC:  Chief Complaint  Patient presents with  . New Patient (Initial Visit)  . Hospitalization Follow-up    HPI  Andrea [name] presents for copd flare up, with 0xygen dropping  To 88 , stayed  In hospital for 2 days patient was seen in the hospital with hypoxia she responded well to steroid and breathing treatment.  Sent home but she came right back to the ER because of worsening breathing problem so she has to be admitted for today.  Patient has a history of covid, also with cardiac cath in 2015 became unremarkable.  Patient continues smoke 1-per day.  She was advised to stop smoking  Past Medical History:  Diagnosis Date  . Anxiety    panic attacks, chest pain  . Arthritis   . COPD (chronic obstructive pulmonary disease) ([place])   . GERD (gastroesophageal reflux disease)   . Hypertension    not medicated  . Non-obstructive CAD in native artery    a. cardiac cath 01/2014: showed minor irregularities, mildly elevated left ventricular end-diastolic pressure and normal ejection fraction; b. 03/2015 MV: low risk.  . Palpitations    a. 08/2016 Event Monitor: occas PVC's, two brief runs of SVT (4 and 7 beats).  Marland Kitchen PONV (postoperative nausea and vomiting)   . Symptomatic PVC's (premature ventricular contractions)   . Tobacco abuse   . Wears dentures    partial upper and lower    Past Surgical History:  Procedure Laterality Date  . CARDIAC CATHETERIZATION  01/03/14  . CARDIAC CATHETERIZATION    . CHOLECYSTECTOMY    . COLONOSCOPY WITH PROPOFOL N/A 10/05/2019   Procedure: COLONOSCOPY WITH PROPOFOL;  Surgeon: Lucilla Lame, MD;  Location: [facility] ENDOSCOPY;  Service: Endoscopy;  Laterality: N/A;  . ESOPHAGOGASTRODUODENOSCOPY (EGD) WITH PROPOFOL N/A 07/11/2016   Procedure: ESOPHAGOGASTRODUODENOSCOPY (EGD) WITH PROPOFOL;  Surgeon: Lucilla Lame, MD;  Location:  [place];  Service: Endoscopy;  Laterality: N/A;  . ESOPHAGOGASTRODUODENOSCOPY (EGD) WITH PROPOFOL N/A 10/05/2019   Procedure: ESOPHAGOGASTRODUODENOSCOPY (EGD) WITH PROPOFOL;  Surgeon: Lucilla Lame, MD;  Location: [facility] ENDOSCOPY;  Service: Endoscopy;  Laterality: N/A;  . EYE SURGERY    . IMAGE GUIDED SINUS SURGERY N/A 11/30/2014   Procedure: IMAGE GUIDED SINUS SURGERY;  Surgeon: Carloyn Manner, MD;  Location: [place];  Service: ENT;  Laterality: N/A;  GAVE DISK TO CE CE  . MAXILLARY ANTROSTOMY Bilateral 11/30/2014   Procedure: MAXILLARY ANTROSTOMY;  Surgeon: Carloyn Manner, MD;  Location: [place];  Service: ENT;  Laterality: Bilateral;  . SEPTOPLASTY N/A 11/30/2014   Procedure: SEPTOPLASTY;  Surgeon: Carloyn Manner, MD;  Location: [place];  Service: ENT;  Laterality: N/A;  . SPHENOIDECTOMY Left 11/30/2014   Procedure: Coralee Pesa, ;  Surgeon: Carloyn Manner, MD;  Location: [place];  Service: ENT;  Laterality: Left;  Marland Kitchen VAGINAL HYSTERECTOMY  1995   vaginal    Family History  Problem Relation Age of Onset  . Emphysema Father   . Asthma Father   . Heart disease Father   . Heart attack Father   . Arthritis Father        RA  . Colon cancer Father   . Hypertension Mother   . Breast cancer Neg Hx     Social History   Socioeconomic History  . Marital status: Married    Spouse name: Not on  file  . Number of children: 3  . Years of education: Not on file  . Highest education level: GED or equivalent  Occupational History  . Occupation: Disabled    Comment: Social Security as of 05/21/2011  Tobacco Use  . Smoking status: Current Every Day Smoker    Packs/day: 1.00    Years: 20.00    Pack years: 20.00    Types: Cigarettes  . Smokeless tobacco: Never Used  . Tobacco comment: 1 cig today/ 01/13/2020  Vaping Use  . Vaping Use: Never used  Substance and Sexual Activity  . Alcohol use: No    Alcohol/week: 0.0 standard  drinks  . Drug use: No  . Sexual activity: Not Currently  Other Topics Concern  . Not on file  Social History Narrative   Lives at home with husband. Independent at baseline.   Social Determinants of Health   Financial Resource Strain: Low Risk   . Difficulty of Paying Living Expenses: Not hard at all  Food Insecurity: No Food Insecurity  . Worried About Charity fundraiser in the Last Year: Never true  . Ran Out of Food in the Last Year: Never true  Transportation Needs: No Transportation Needs  . Lack of Transportation (Medical): No  . Lack of Transportation (Non-Medical): No  Physical Activity: Inactive  . Days of Exercise per Week: 0 days  . Minutes of Exercise per Session: 0 min  Stress: Stress Concern Present  . Feeling of Stress : Rather much  Social Connections: Moderately Isolated  . Frequency of Communication with Friends and Family: More than three times a week  . Frequency of Social Gatherings with Friends and Family: Once a week  . Attends Religious Services: Never  . Active Member of Clubs or Organizations: No  . Attends Archivist Meetings: Never  . Marital Status: Married  Human resources officer Violence: Not At Risk  . Fear of Current or Ex-Partner: No  . Emotionally Abused: No  . Physically Abused: No  . Sexually Abused: No     Current Outpatient Medications:  .  albuterol (PROVENTIL) (2.5 MG/3ML) 0.083% nebulizer solution, Take 3 mLs (2.5 mg total) by nebulization every 4 (four) hours as needed for wheezing or shortness of breath., Disp: 75 mL, Rfl: 2 .  albuterol (PROVENTIL) (2.5 MG/3ML) 0.083% nebulizer solution, Take 3 mLs (2.5 mg total) by nebulization every 6 (six) hours as needed for wheezing or shortness of breath., Disp: 120 mL, Rfl: 2 .  albuterol (VENTOLIN HFA) 108 (90 Base) MCG/ACT inhaler, Inhale 2 puffs into the lungs every 6 (six) hours as needed for wheezing or shortness of breath., Disp: 18 g, Rfl: 6 .  benzonatate (TESSALON) 200 MG  capsule, Take 1 capsule (200 mg total) by mouth 3 (three) times daily as needed., Disp: 30 capsule, Rfl: 0 .  Budeson-Glycopyrrol-Formoterol (BREZTRI AEROSPHERE) 160-9-4.8 MCG/ACT AERO, Inhale 2 puffs into the lungs in the morning and at bedtime., Disp: 10.7 g, Rfl: 0 .  clonazePAM (KLONOPIN) 1 MG tablet, TAKE 1 TABLET (1 MG TOTAL) BY MOUTH 2 (TWO) TIMES DAILY AS NEEDED. (Patient taking differently: Take 1 mg by mouth 2 (two) times daily.), Disp: 60 tablet, Rfl: 3 .  DEXILANT 60 MG capsule, TAKE 1 CAPSULE (60 MG TOTAL) BY MOUTH DAILY. **PLEASE SCHEDULE 1 YEAR FOLLOW UP APPT**, Disp: 90 capsule, Rfl: 0 .  fluticasone (FLONASE) 50 MCG/ACT nasal spray, Place 2 sprays into both nostrils daily., Disp: 16 g, Rfl: 6 .  hydroxychloroquine (PLAQUENIL) 200  MG tablet, TAKE 2 TABLETS (400 MG TOTAL) BY MOUTH DAILY. FOR DISCOID LUPUS (L93.0), Disp: 180 tablet, Rfl: 0 .  Misc. Devices (PULSE OXIMETER DELUXE) MISC, Use daily to monitor oxygen levels, Disp: 1 each, Rfl: 0 .  nitroGLYCERIN (NITROSTAT) 0.4 MG SL tablet, Place 1 tablet (0.4 mg total) under the tongue every 5 (five) minutes as needed for chest pain., Disp: 25 tablet, Rfl: 0 .  predniSONE (DELTASONE) 10 MG tablet, Take 4 tabs for 3 days, then 3 tabs for 3 days, then 2 tabs for 3 days, then 1 tab for 3 days, then 1/2 tab for 4 days., Disp: 32 tablet, Rfl: 0 .  propranolol (INDERAL) 20 MG tablet, TAKE 1 TABLET (20 MG TOTAL) BY MOUTH 2 (TWO) TIMES DAILY. (BETA BLOCKER) (Patient taking differently: Take 20 mg by mouth 2 (two) times daily. Hold if systolic blood pressure is <100   (BETA BLOCKER)), Disp: 180 tablet, Rfl: 0 .  Respiratory Therapy Supplies (FLUTTER) DEVI, 1 Device by Does not apply route daily., Disp: 1 each, Rfl: 0 .  Spacer/Aero-Holding Chambers (AEROCHAMBER MV) inhaler, Use as instructed, Disp: 1 each, Rfl: 0   Allergies  Allergen Reactions  . Alum & Mag Hydroxide-Simeth Diarrhea and Nausea Only  . Aspirin     Nosebleed   . Augmentin  [Amoxicillin-Pot Clavulanate]     Palpitations   . Bactrim [Sulfamethoxazole-Trimethoprim] Itching  . Cefdinir Other (See Comments)    tachycardia  . Chantix [Varenicline]     Bad dreams  . Doxycycline Other (See Comments)    Nausea, migraine  . Multaq [Dronedarone] Other (See Comments)    Lip/ mouth numbness/ tongue swelling  . Ivp Dye [Iodinated Diagnostic Agents] Palpitations    ROS Review of Systems  Constitutional: Negative.   HENT: Negative.   Eyes: Negative.   Respiratory: Negative.   Cardiovascular: Negative.   Gastrointestinal: Negative.   Endocrine: Negative.   Genitourinary: Negative.   Musculoskeletal: Negative.   Skin: Negative.   Allergic/Immunologic: Negative.   Neurological: Negative.   Hematological: Negative.   Psychiatric/Behavioral: Negative.   All other systems reviewed and are negative.     Objective:    Physical Exam Vitals reviewed.  Constitutional:      Appearance: Normal appearance.  HENT:     Mouth/Throat:     Mouth: Mucous membranes are moist.  Eyes:     Pupils: Pupils are equal, round, and reactive to light.  Neck:     Vascular: No carotid bruit.  Cardiovascular:     Rate and Rhythm: Normal rate and regular rhythm.     Pulses: Normal pulses.     Heart sounds: Normal heart sounds.  Pulmonary:     Effort: Pulmonary effort is normal.     Breath sounds: Normal breath sounds.  Abdominal:     General: Bowel sounds are normal.     Palpations: Abdomen is soft. There is no hepatomegaly, splenomegaly or mass.     Tenderness: There is no abdominal tenderness.     Hernia: No hernia is present.  Musculoskeletal:        General: No tenderness.     Cervical back: Neck supple.     Right lower leg: No edema.     Left lower leg: No edema.  Skin:    Findings: No rash.  Neurological:     Mental Status: She is alert and oriented to person, place, and time.     Motor: No weakness.  Psychiatric:  Mood and Affect: Mood and affect  normal.        Behavior: Behavior normal.     BP 133/75   Pulse 94   Ht 5\' 2"  (1.575 m)   Wt 152 lb 4.8 oz (69.1 kg)   SpO2 98%   BMI 27.86 kg/m  Wt Readings from Last 3 Encounters:  06/20/20 152 lb 4.8 oz (69.1 kg)  06/12/20 160 lb (72.6 kg)  04/18/20 156 lb 6.4 oz (70.9 kg)     Health Maintenance Due  Topic Date Due  . MAMMOGRAM  11/26/2018    There are no preventive care reminders to display for this patient.  Lab Results  Component Value Date   TSH 2.800 06/08/2019   Lab Results  Component Value Date   WBC 11.5 (H) 06/13/2020   HGB 13.5 06/13/2020   HCT 40.8 06/13/2020   MCV 80.5 06/13/2020   PLT 257 06/13/2020   Lab Results  Component Value Date   NA 139 06/13/2020   K 4.2 06/13/2020   CO2 28 06/13/2020   GLUCOSE 158 (H) 06/13/2020   BUN 15 06/13/2020   CREATININE 0.67 06/13/2020   BILITOT 0.5 04/02/2020   ALKPHOS 83 04/02/2020   AST 19 04/02/2020   ALT 19 04/02/2020   PROT 7.5 04/02/2020   ALBUMIN 3.8 04/02/2020   CALCIUM 9.2 06/13/2020   ANIONGAP 8 06/13/2020   Lab Results  Component Value Date   CHOL 206 (H) 06/08/2019   Lab Results  Component Value Date   HDL 39 (L) 06/08/2019   Lab Results  Component Value Date   LDLCALC 142 (H) 06/08/2019   Lab Results  Component Value Date   TRIG 138 06/08/2019   Lab Results  Component Value Date   CHOLHDL 5.3 (H) 06/08/2019   Lab Results  Component Value Date   HGBA1C 5.7 (H) 06/12/2020      Assessment & Plan:   Problem List Items Addressed This Visit      Cardiovascular and Mediastinum   Essential (primary) hypertension    Patient blood pressure is normal patient denies any chest pain or shortness of breath there is no history of palpitation paroxysmal nocturnal dyspnea patient can walk  25yards without any problem patient was advised to follow low-salt low-cholesterol diet  I reviewed the results of Sprint trial  ideally I want to keep systolic blood pressure below 130 mmHg,  patient was asked to check blood pressure 3 times a week and give me a report on that.  Patient will be follow-up in 3 months, patient will call me back for any change in the cardiovascular symptoms         PVC's (premature ventricular contractions) - Primary    Patient is known to have a isolated PVC there is no history of ventricular tachycardia or passing out spell.        Respiratory   Acute hypoxemic respiratory failure [facility])    Patient was admitted to the hospital with hypoxia and wheezing.  Now she is better.  She was advised to quit smoking.        Other   Tobacco abuse    - I instructed the patient to stop smoking and provided them with smoking cessation materials.  - I informed the patient that smoking puts them at increased risk for cancer, COPD, hypertension, and more.  - Informed the patient to seek help if they begin to have trouble breathing, develop chest pain, start to cough up blood, feel faint,  or pass out.         No orders of the defined types were placed in this encounter.   Follow-up: No follow-ups on file.    Cletis Athens, MD

## 2020-06-26 ENCOUNTER — Inpatient Hospital Stay: Payer: Medicare Other | Admitting: Adult Health

## 2020-06-28 ENCOUNTER — Inpatient Hospital Stay: Payer: Medicare Other | Admitting: Family Medicine

## 2020-07-16 ENCOUNTER — Other Ambulatory Visit: Payer: Self-pay | Admitting: Gastroenterology

## 2020-07-17 ENCOUNTER — Telehealth: Payer: Self-pay | Admitting: Gastroenterology

## 2020-07-17 ENCOUNTER — Other Ambulatory Visit: Payer: Self-pay | Admitting: Family Medicine

## 2020-07-17 DIAGNOSIS — R002 Palpitations: Secondary | ICD-10-CM

## 2020-07-17 NOTE — Telephone Encounter (Signed)
DEXILANT 60 MG capsule  [facility]  90 day  I told Kan[place] it was refilled on 02/22 for  1 yr, [facility] only [facility] mth[facility]

## 2020-07-19 ENCOUNTER — Other Ambulatory Visit: Payer: Self-pay

## 2020-07-19 MED ORDER — DEXLANSOPRAZOLE 60 MG PO CPDR
60.0000 mg | DELAYED_RELEASE_CAPSULE | Freq: Every day | ORAL | 0 refills | Status: DC
Start: 2020-07-19 — End: 2020-10-17

## 2020-07-19 NOTE — Telephone Encounter (Signed)
Prescription for Dexilant has been sent to pt's pharmacy.  

## 2020-07-20 ENCOUNTER — Encounter: Payer: Self-pay | Admitting: Family Medicine

## 2020-07-20 ENCOUNTER — Other Ambulatory Visit: Payer: Self-pay

## 2020-07-20 ENCOUNTER — Ambulatory Visit (INDEPENDENT_AMBULATORY_CARE_PROVIDER_SITE_OTHER): Payer: Medicare Other | Admitting: Family Medicine

## 2020-07-20 VITALS — BP 132/83 | HR 88 | Ht 64.0 in | Wt 151.9 lb

## 2020-07-20 DIAGNOSIS — F410 Panic disorder [episodic paroxysmal anxiety] without agoraphobia: Secondary | ICD-10-CM | POA: Diagnosis not present

## 2020-07-20 MED ORDER — CITALOPRAM HYDROBROMIDE 10 MG PO TABS: 10.0000 mg | ORAL_TABLET | Freq: Every day | ORAL | 3 refills | Status: DC

## 2020-07-20 MED ORDER — CLONAZEPAM 1 MG PO TABS: 1.0000 mg | ORAL_TABLET | Freq: Two times a day (BID) | ORAL | 3 refills | Status: DC | PRN

## 2020-07-20 NOTE — Progress Notes (Signed)
Established Patient Office Visit  SUBJECTIVE:  Subjective  Patient ID: Andrea [name], female    DOB: 1965-11-15  Age: 55 y.o. MRN: [id number]  CC:  Chief Complaint  Patient presents with  . Follow-up    Patient here today for klonopin refill     HPI Andrea [name] is a 55 y.o. female presenting today for     Past Medical History:  Diagnosis Date  . Anxiety    panic attacks, chest pain  . Arthritis   . COPD (chronic obstructive pulmonary disease) ([place])   . GERD (gastroesophageal reflux disease)   . Hypertension    not medicated  . Non-obstructive CAD in native artery    a. cardiac cath 01/2014: showed minor irregularities, mildly elevated left ventricular end-diastolic pressure and normal ejection fraction; b. 03/2015 MV: low risk.  . Palpitations    a. 08/2016 Event Monitor: occas PVC's, two brief runs of SVT (4 and 7 beats).  Marland Kitchen PONV (postoperative nausea and vomiting)   . Symptomatic PVC's (premature ventricular contractions)   . Tobacco abuse   . Wears dentures    partial upper and lower    Past Surgical History:  Procedure Laterality Date  . CARDIAC CATHETERIZATION  01/03/14  . CARDIAC CATHETERIZATION    . CHOLECYSTECTOMY    . COLONOSCOPY WITH PROPOFOL N/A 10/05/2019   Procedure: COLONOSCOPY WITH PROPOFOL;  Surgeon: Lucilla Lame, MD;  Location: [facility] ENDOSCOPY;  Service: Endoscopy;  Laterality: N/A;  . ESOPHAGOGASTRODUODENOSCOPY (EGD) WITH PROPOFOL N/A 07/11/2016   Procedure: ESOPHAGOGASTRODUODENOSCOPY (EGD) WITH PROPOFOL;  Surgeon: Lucilla Lame, MD;  Location: [place];  Service: Endoscopy;  Laterality: N/A;  . ESOPHAGOGASTRODUODENOSCOPY (EGD) WITH PROPOFOL N/A 10/05/2019   Procedure: ESOPHAGOGASTRODUODENOSCOPY (EGD) WITH PROPOFOL;  Surgeon: Lucilla Lame, MD;  Location: [facility] ENDOSCOPY;  Service: Endoscopy;  Laterality: N/A;  . EYE SURGERY    . IMAGE GUIDED SINUS SURGERY N/A 11/30/2014   Procedure: IMAGE GUIDED SINUS SURGERY;  Surgeon: Carloyn Manner, MD;   Location: [place];  Service: ENT;  Laterality: N/A;  GAVE DISK TO CE CE  . MAXILLARY ANTROSTOMY Bilateral 11/30/2014   Procedure: MAXILLARY ANTROSTOMY;  Surgeon: Carloyn Manner, MD;  Location: [place];  Service: ENT;  Laterality: Bilateral;  . SEPTOPLASTY N/A 11/30/2014   Procedure: SEPTOPLASTY;  Surgeon: Carloyn Manner, MD;  Location: [place];  Service: ENT;  Laterality: N/A;  . SPHENOIDECTOMY Left 11/30/2014   Procedure: Coralee Pesa, ;  Surgeon: Carloyn Manner, MD;  Location: [place];  Service: ENT;  Laterality: Left;  Marland Kitchen VAGINAL HYSTERECTOMY  1995   vaginal    Family History  Problem Relation Age of Onset  . Emphysema Father   . Asthma Father   . Heart disease Father   . Heart attack Father   . Arthritis Father        RA  . Colon cancer Father   . Hypertension Mother   . Breast cancer Neg Hx     Social History   Socioeconomic History  . Marital status: Married    Spouse name: Not on file  . Number of children: 3  . Years of education: Not on file  . Highest education level: GED or equivalent  Occupational History  . Occupation: Disabled    Comment: Social Security as of 05/21/2011  Tobacco Use  . Smoking status: Current Every Day Smoker    Packs/day: 1.00    Years: 20.00    Pack years: 20.00    Types: Cigarettes  .  Smokeless tobacco: Never Used  . Tobacco comment: 1 cig today/ 01/13/2020  Vaping Use  . Vaping Use: Never used  Substance and Sexual Activity  . Alcohol use: No    Alcohol/week: 0.0 standard drinks  . Drug use: No  . Sexual activity: Not Currently  Other Topics Concern  . Not on file  Social History Narrative   Lives at home with husband. Independent at baseline.   Social Determinants of Health   Financial Resource Strain: Low Risk   . Difficulty of Paying Living Expenses: Not hard at all  Food Insecurity: No Food Insecurity  . Worried About Programme researcher, broadcasting/film/videounning Out of Food in the Last Year: Never true   . Ran Out of Food in the Last Year: Never true  Transportation Needs: No Transportation Needs  . Lack of Transportation (Medical): No  . Lack of Transportation (Non-Medical): No  Physical Activity: Inactive  . Days of Exercise per Week: 0 days  . Minutes of Exercise per Session: 0 min  Stress: Stress Concern Present  . Feeling of Stress : Rather much  Social Connections: Moderately Isolated  . Frequency of Communication with Friends and Family: More than three times a week  . Frequency of Social Gatherings with Friends and Family: Once a week  . Attends Religious Services: Never  . Active Member of Clubs or Organizations: No  . Attends BankerClub or Organization Meetings: Never  . Marital Status: Married  Catering managerntimate Partner Violence: Not At Risk  . Fear of Current or Ex-Partner: No  . Emotionally Abused: No  . Physically Abused: No  . Sexually Abused: No     Current Outpatient Medications:  .  albuterol (PROVENTIL) (2.5 MG/3ML) 0.083% nebulizer solution, Take 3 mLs (2.5 mg total) by nebulization every 4 (four) hours as needed for wheezing or shortness of breath., Disp: 75 mL, Rfl: 2 .  albuterol (PROVENTIL) (2.5 MG/3ML) 0.083% nebulizer solution, Take 3 mLs (2.5 mg total) by nebulization every 6 (six) hours as needed for wheezing or shortness of breath., Disp: 120 mL, Rfl: 2 .  albuterol (VENTOLIN HFA) 108 (90 Base) MCG/ACT inhaler, Inhale 2 puffs into the lungs every 6 (six) hours as needed for wheezing or shortness of breath., Disp: 18 g, Rfl: 6 .  benzonatate (TESSALON) 200 MG capsule, Take 1 capsule (200 mg total) by mouth 3 (three) times daily as needed., Disp: 30 capsule, Rfl: 0 .  Budeson-Glycopyrrol-Formoterol (BREZTRI AEROSPHERE) 160-9-4.8 MCG/ACT AERO, Inhale 2 puffs into the lungs in the morning and at bedtime., Disp: 10.7 g, Rfl: 0 .  citalopram (CELEXA) 10 MG tablet, Take 1 tablet (10 mg total) by mouth daily., Disp: 30 tablet, Rfl: 3 .  dexlansoprazole (DEXILANT) 60 MG capsule,  Take 1 capsule (60 mg total) by mouth daily., Disp: 90 capsule, Rfl: 0 .  fluticasone (FLONASE) 50 MCG/ACT nasal spray, Place 2 sprays into both nostrils daily., Disp: 16 g, Rfl: 6 .  hydroxychloroquine (PLAQUENIL) 200 MG tablet, TAKE 2 TABLETS (400 MG TOTAL) BY MOUTH DAILY. FOR DISCOID LUPUS (L93.0), Disp: 180 tablet, Rfl: 0 .  Misc. Devices (PULSE OXIMETER DELUXE) MISC, Use daily to monitor oxygen levels, Disp: 1 each, Rfl: 0 .  nitroGLYCERIN (NITROSTAT) 0.4 MG SL tablet, Place 1 tablet (0.4 mg total) under the tongue every 5 (five) minutes as needed for chest pain., Disp: 25 tablet, Rfl: 0 .  predniSONE (DELTASONE) 10 MG tablet, Take 4 tabs for 3 days, then 3 tabs for 3 days, then 2 tabs for 3 days, then  1 tab for 3 days, then 1/2 tab for 4 days., Disp: 32 tablet, Rfl: 0 .  propranolol (INDERAL) 20 MG tablet, TAKE 1 TABLET BY MOUTH TWICE A DAY, Disp: 180 tablet, Rfl: 1 .  Respiratory Therapy Supplies (FLUTTER) DEVI, 1 Device by Does not apply route daily., Disp: 1 each, Rfl: 0 .  Spacer/Aero-Holding Chambers (AEROCHAMBER MV) inhaler, Use as instructed, Disp: 1 each, Rfl: 0 .  clonazePAM (KLONOPIN) 1 MG tablet, Take 1 tablet (1 mg total) by mouth 2 (two) times daily as needed., Disp: 60 tablet, Rfl: 3   Allergies  Allergen Reactions  . Alum & Mag Hydroxide-Simeth Diarrhea and Nausea Only  . Aspirin     Nosebleed   . Augmentin [Amoxicillin-Pot Clavulanate]     Palpitations   . Bactrim [Sulfamethoxazole-Trimethoprim] Itching  . Cefdinir Other (See Comments)    tachycardia  . Chantix [Varenicline]     Bad dreams  . Doxycycline Other (See Comments)    Nausea, migraine  . Multaq [Dronedarone] Other (See Comments)    Lip/ mouth numbness/ tongue swelling  . Ivp Dye [Iodinated Diagnostic Agents] Palpitations    ROS Review of Systems  Constitutional: Negative.   HENT: Negative.   Respiratory: Negative.   Gastrointestinal: Negative.   Genitourinary: Negative.   Psychiatric/Behavioral:  Positive for agitation. The patient is nervous/anxious.      OBJECTIVE:    Physical Exam Vitals reviewed.  Constitutional:      Appearance: Normal appearance.  HENT:     Mouth/Throat:     Mouth: Mucous membranes are moist.  Eyes:     Pupils: Pupils are equal, round, and reactive to light.  Cardiovascular:     Rate and Rhythm: Normal rate and regular rhythm.  Pulmonary:     Effort: Pulmonary effort is normal.  Skin:    General: Skin is warm.  Psychiatric:        Mood and Affect: Mood normal.     BP 132/83   Pulse 88   Ht 5\' 4"  (1.626 m)   Wt 151 lb 14.4 oz (68.9 kg)   BMI 26.07 kg/m  Wt Readings from Last 3 Encounters:  07/20/20 151 lb 14.4 oz (68.9 kg)  06/20/20 152 lb 4.8 oz (69.1 kg)  06/12/20 160 lb (72.6 kg)    Health Maintenance Due  Topic Date Due  . MAMMOGRAM  11/26/2018  . COVID-19 Vaccine (3 - Booster for Moderna series) 05/25/2020    There are no preventive care reminders to display for this patient.  CBC Latest Ref Rng & Units 06/13/2020 06/12/2020 04/02/2020  WBC 4.0 - 10.5 K/uL 11.5(H) 7.7 5.4  Hemoglobin 12.0 - 15.0 g/dL 13.5 15.1(H) 14.6  Hematocrit 36.0 - 46.0 % 40.8 45.6 44.2  Platelets 150 - 400 K/uL 257 195 229   CMP Latest Ref Rng & Units 06/13/2020 06/12/2020 04/02/2020  Glucose 70 - 99 mg/dL 158(H) 94 91  BUN 6 - 20 mg/dL 15 12 13   Creatinine 0.44 - 1.00 mg/dL 0.67 0.78 0.72  Sodium 135 - 145 mmol/L 139 137 139  Potassium 3.5 - 5.1 mmol/L 4.2 4.4 4.1  Chloride 98 - 111 mmol/L 103 101 100  CO2 22 - 32 mmol/L 28 27 28   Calcium 8.9 - 10.3 mg/dL 9.2 9.4 9.2  Total Protein 6.5 - 8.1 g/dL - - 7.5  Total Bilirubin 0.3 - 1.2 mg/dL - - 0.5  Alkaline Phos 38 - 126 U/L - - 83  AST 15 - 41 U/L - - 19  ALT 0 - 44 U/L - - 19    Lab Results  Component Value Date   TSH 2.800 06/08/2019   Lab Results  Component Value Date   ALBUMIN 3.8 04/02/2020   ANIONGAP 8 06/13/2020   Lab Results  Component Value Date   CHOL 206 (H) 06/08/2019   CHOL  193 06/11/2016   CHOL 193 03/19/2015   HDL 39 (L) 06/08/2019   HDL 40 06/11/2016   HDL 34 (L) 03/19/2015   LDLCALC 142 (H) 06/08/2019   LDLCALC 121 (H) 06/11/2016   LDLCALC 139 (H) 03/19/2015   CHOLHDL 5.3 (H) 06/08/2019   CHOLHDL 4.8 (H) 06/11/2016   CHOLHDL 5.7 03/19/2015   Lab Results  Component Value Date   TRIG 138 06/08/2019   Lab Results  Component Value Date   HGBA1C 5.7 (H) 06/12/2020   HGBA1C 5.6 03/19/2015      ASSESSMENT & PLAN:   Problem List Items Addressed This Visit      Other   Panic disorder - Primary    Patient doing well on Clonazepam BID, having intermittent episodes of Anxiety without full anxiety attacks.   Plan- Refill Klonipan.      Relevant Medications   citalopram (CELEXA) 10 MG tablet      Meds ordered this encounter  Medications  . clonazePAM (KLONOPIN) 1 MG tablet    Sig: Take 1 tablet (1 mg total) by mouth 2 (two) times daily as needed.    Dispense:  60 tablet    Refill:  3    Not to exceed 2 additional fills before 05/22/2020  . citalopram (CELEXA) 10 MG tablet    Sig: Take 1 tablet (10 mg total) by mouth daily.    Dispense:  30 tablet    Refill:  3      Follow-up: No follow-ups on file.    Beckie Salts, [place] [address] [place],  [address]

## 2020-07-20 NOTE — Assessment & Plan Note (Signed)
Patient doing well on Clonazepam BID, having intermittent episodes of Anxiety without full anxiety attacks.   Plan- Refill Klonipan.

## 2020-08-13 ENCOUNTER — Telehealth: Payer: Medicare Other | Admitting: Nurse Practitioner

## 2020-08-13 DIAGNOSIS — R059 Cough, unspecified: Secondary | ICD-10-CM

## 2020-08-13 MED ORDER — AZITHROMYCIN 250 MG PO TABS
ORAL_TABLET | ORAL | 0 refills | Status: AC
Start: 2020-08-13 — End: 2020-08-18

## 2020-08-13 MED ORDER — PREDNISONE 10 MG PO TABS: ORAL_TABLET | ORAL | 0 refills | Status: DC

## 2020-08-13 NOTE — Progress Notes (Signed)
We are sorry that you are not feeling well.  Here is how we plan to help!  Based on your presentation I believe you most likely have A cough due to bacteria.  When patients have a fever and a productive cough with a change in color or increased sputum production, we are concerned about bacterial bronchitis.  If left untreated it can progress to pneumonia.  If your symptoms do not improve with your treatment plan it is important that you contact your provider.   I have prescribed Azithromyin 250 mg: two tablets now and then one tablet daily for 4 additonal days    Directions for 6 day taper: Day 1: 2 tablets before breakfast, 1 after both lunch & dinner and 2 at bedtime Day 2: 1 tab before breakfast, 1 after both lunch & dinner and 2 at bedtime Day 3: 1 tab at each meal & 1 at bedtime Day 4: 1 tab at breakfast, 1 at lunch, 1 at bedtime Day 5: 1 tab at breakfast & 1 tab at bedtime Day 6: 1 tab at breakfast  From your responses in the eVisit questionnaire you describe inflammation in the upper respiratory tract which is causing a significant cough.  This is commonly called Bronchitis and has four common causes:   Allergies Viral Infections Acid Reflux Bacterial Infection Allergies, viruses and acid reflux are treated by controlling symptoms or eliminating the cause. An example might be a cough caused by taking certain blood pressure medications. You stop the cough by changing the medication. Another example might be a cough caused by acid reflux. Controlling the reflux helps control the cough.  USE OF BRONCHODILATOR ("RESCUE") INHALERS: There is a risk from using your bronchodilator too frequently.  The risk is that over-reliance on a medication which only relaxes the muscles surrounding the breathing tubes can reduce the effectiveness of medications prescribed to reduce swelling and congestion of the tubes themselves.  Although you feel brief relief from the bronchodilator inhaler, your asthma may  actually be worsening with the tubes becoming more swollen and filled with mucus.  This can delay other crucial treatments, such as oral steroid medications. If you need to use a bronchodilator inhaler daily, several times per day, you should discuss this with your provider.  There are probably better treatments that could be used to keep your asthma under control.     HOME CARE Only take medications as instructed by your medical team. Complete the entire course of an antibiotic. Drink plenty of fluids and get plenty of rest. Avoid close contacts especially the very young and the elderly Cover your mouth if you cough or cough into your sleeve. Always remember to wash your hands A steam or ultrasonic humidifier can help congestion.   GET HELP RIGHT AWAY IF: You develop worsening fever. You become short of breath You cough up blood. Your symptoms persist after you have completed your treatment plan MAKE SURE YOU  Understand these instructions. Will watch your condition. Will get help right away if you are not doing well or get worse.  Your e-visit answers were reviewed by a board certified advanced clinical practitioner to complete your personal care plan.  Depending on the condition, your plan could have included both over the counter or prescription medications. If there is a problem please reply  once you have received a response from your provider. Your safety is important to [place].  If you have drug allergies check your prescription carefully.    You can  use MyChart to ask questions about today's visit, request a non-urgent call back, or ask for a work or school excuse for 24 hours related to this e-Visit. If it has been greater than 24 hours you will need to follow up with your provider, or enter a new e-Visit to address those concerns. You will get an e-mail in the next two days asking about your experience.  I hope that your e-visit has been valuable and will speed your recovery. Thank  you for using e-visits.   I have spent at least 5 minutes reviewing and documenting in the patient's chart.

## 2020-08-15 ENCOUNTER — Other Ambulatory Visit: Payer: Self-pay | Admitting: Family Medicine

## 2020-08-18 ENCOUNTER — Ambulatory Visit
Admission: RE | Admit: 2020-08-18 | Discharge: 2020-08-18 | Disposition: A | Discharge status: Home / Self Care | Payer: Medicare Other | Source: Ambulatory Visit | Attending: Family Medicine | Admitting: Family Medicine

## 2020-08-18 ENCOUNTER — Ambulatory Visit
Admission: RE | Admit: 2020-08-18 | Discharge: 2020-08-18 | Disposition: A | Discharge status: Home / Self Care | Payer: Medicare Other | Attending: Family Medicine | Admitting: Family Medicine

## 2020-08-18 ENCOUNTER — Ambulatory Visit (INDEPENDENT_AMBULATORY_CARE_PROVIDER_SITE_OTHER): Payer: Medicare Other | Admitting: Family Medicine

## 2020-08-18 ENCOUNTER — Other Ambulatory Visit: Payer: Self-pay

## 2020-08-18 ENCOUNTER — Encounter: Payer: Self-pay | Admitting: Family Medicine

## 2020-08-18 VITALS — BP 150/88 | HR 84 | Ht 64.0 in | Wt 149.5 lb

## 2020-08-18 DIAGNOSIS — R0602 Shortness of breath: Secondary | ICD-10-CM

## 2020-08-18 DIAGNOSIS — I700 Atherosclerosis of aorta: Secondary | ICD-10-CM | POA: Diagnosis not present

## 2020-08-18 DIAGNOSIS — R059 Cough, unspecified: Secondary | ICD-10-CM | POA: Diagnosis not present

## 2020-08-18 DIAGNOSIS — R21 Rash and other nonspecific skin eruption: Secondary | ICD-10-CM | POA: Diagnosis not present

## 2020-08-18 MED ORDER — PREDNISONE 20 MG PO TABS: 20.0000 mg | ORAL_TABLET | Freq: Every day | ORAL | 0 refills | Status: DC

## 2020-08-18 NOTE — Progress Notes (Signed)
Established Patient Office Visit  SUBJECTIVE:  Subjective  Patient ID: Andrea [name], female    DOB: 02/17/1966  Age: 55 y.o. MRN: [id number]  CC:  Chief Complaint  Patient presents with   Follow-up    HPI Andrea [name] is a 55 y.o. female presenting today for     Past Medical History:  Diagnosis Date   Anxiety    panic attacks, chest pain   Arthritis    COPD (chronic obstructive pulmonary disease) ([place])    GERD (gastroesophageal reflux disease)    Hypertension    not medicated   Non-obstructive CAD in native artery    a. cardiac cath 01/2014: showed minor irregularities, mildly elevated left ventricular end-diastolic pressure and normal ejection fraction; b. 03/2015 MV: low risk.   Palpitations    a. 08/2016 Event Monitor: occas PVC's, two brief runs of SVT (4 and 7 beats).   PONV (postoperative nausea and vomiting)    Symptomatic PVC's (premature ventricular contractions)    Tobacco abuse    Wears dentures    partial upper and lower    Past Surgical History:  Procedure Laterality Date   CARDIAC CATHETERIZATION  01/03/14   CARDIAC CATHETERIZATION     CHOLECYSTECTOMY     COLONOSCOPY WITH PROPOFOL N/A 10/05/2019   Procedure: COLONOSCOPY WITH PROPOFOL;  Surgeon: Lucilla Lame, MD;  Location: [facility];  Service: Endoscopy;  Laterality: N/A;   ESOPHAGOGASTRODUODENOSCOPY (EGD) WITH PROPOFOL N/A 07/11/2016   Procedure: ESOPHAGOGASTRODUODENOSCOPY (EGD) WITH PROPOFOL;  Surgeon: Lucilla Lame, MD;  Location: [facility];  Service: Endoscopy;  Laterality: N/A;   ESOPHAGOGASTRODUODENOSCOPY (EGD) WITH PROPOFOL N/A 10/05/2019   Procedure: ESOPHAGOGASTRODUODENOSCOPY (EGD) WITH PROPOFOL;  Surgeon: Lucilla Lame, MD;  Location: [facility];  Service: Endoscopy;  Laterality: N/A;   EYE SURGERY     IMAGE GUIDED SINUS SURGERY N/A 11/30/2014   Procedure: IMAGE GUIDED SINUS SURGERY;  Surgeon: Carloyn Manner, MD;  Location: [facility];  Service: ENT;  Laterality:  N/A;  GAVE DISK TO CE CE   MAXILLARY ANTROSTOMY Bilateral 11/30/2014   Procedure: MAXILLARY ANTROSTOMY;  Surgeon: Carloyn Manner, MD;  Location: [facility];  Service: ENT;  Laterality: Bilateral;   SEPTOPLASTY N/A 11/30/2014   Procedure: SEPTOPLASTY;  Surgeon: Carloyn Manner, MD;  Location: [facility];  Service: ENT;  Laterality: N/A;   SPHENOIDECTOMY Left 11/30/2014   Procedure: Coralee Pesa, ;  Surgeon: Carloyn Manner, MD;  Location: [facility];  Service: ENT;  Laterality: Left;   VAGINAL HYSTERECTOMY  1995   vaginal    Family History  Problem Relation Age of Onset   Emphysema Father    Asthma Father    Heart disease Father    Heart attack Father    Arthritis Father        RA   Colon cancer Father    Hypertension Mother    Breast cancer Neg Hx     Social History   Socioeconomic History   Marital status: Married    Spouse name: Not on file   Number of children: 3   Years of education: Not on file   Highest education level: GED or equivalent  Occupational History   Occupation: Disabled    Comment: Social Security as of 05/21/2011  Tobacco Use   Smoking status: Every Day    Packs/day: 1.00    Years: 20.00    Pack years: 20.00    Types: Cigarettes   Smokeless tobacco: Never   Tobacco comments:    1  cig today/ 01/13/2020  Vaping Use   Vaping Use: Never used  Substance and Sexual Activity   Alcohol use: No    Alcohol/week: 0.0 standard drinks   Drug use: No   Sexual activity: Not Currently  Other Topics Concern   Not on file  Social History Narrative   Lives at home with husband. Independent at baseline.   Social Determinants of Health   Financial Resource Strain: Not on file  Food Insecurity: Not on file  Transportation Needs: Not on file  Physical Activity: Not on file  Stress: Not on file  Social Connections: Not on file  Intimate Partner Violence: Not on file     Current Outpatient Medications:    albuterol  (PROVENTIL) (2.5 MG/3ML) 0.083% nebulizer solution, Take 3 mLs (2.5 mg total) by nebulization every 4 (four) hours as needed for wheezing or shortness of breath., Disp: 75 mL, Rfl: 2   albuterol (PROVENTIL) (2.5 MG/3ML) 0.083% nebulizer solution, Take 3 mLs (2.5 mg total) by nebulization every 6 (six) hours as needed for wheezing or shortness of breath., Disp: 120 mL, Rfl: 2   albuterol (VENTOLIN HFA) 108 (90 Base) MCG/ACT inhaler, Inhale 2 puffs into the lungs every 6 (six) hours as needed for wheezing or shortness of breath., Disp: 18 g, Rfl: 6   azithromycin (ZITHROMAX) 250 MG tablet, Take 2 tablets on day 1, then 1 tablet daily on days 2 through 5, Disp: 6 tablet, Rfl: 0   benzonatate (TESSALON) 200 MG capsule, Take 1 capsule (200 mg total) by mouth 3 (three) times daily as needed., Disp: 30 capsule, Rfl: 0   Budeson-Glycopyrrol-Formoterol (BREZTRI AEROSPHERE) 160-9-4.8 MCG/ACT AERO, Inhale 2 puffs into the lungs in the morning and at bedtime., Disp: 10.7 g, Rfl: 0   citalopram (CELEXA) 10 MG tablet, TAKE 1 TABLET BY MOUTH EVERY DAY, Disp: 90 tablet, Rfl: 2   clonazePAM (KLONOPIN) 1 MG tablet, Take 1 tablet (1 mg total) by mouth 2 (two) times daily as needed., Disp: 60 tablet, Rfl: 3   dexlansoprazole (DEXILANT) 60 MG capsule, Take 1 capsule (60 mg total) by mouth daily., Disp: 90 capsule, Rfl: 0   fluticasone (FLONASE) 50 MCG/ACT nasal spray, Place 2 sprays into both nostrils daily., Disp: 16 g, Rfl: 6   hydroxychloroquine (PLAQUENIL) 200 MG tablet, TAKE 2 TABLETS (400 MG TOTAL) BY MOUTH DAILY. FOR DISCOID LUPUS (L93.0), Disp: 180 tablet, Rfl: 0   Misc. Devices (PULSE OXIMETER DELUXE) MISC, Use daily to monitor oxygen levels, Disp: 1 each, Rfl: 0   nitroGLYCERIN (NITROSTAT) 0.4 MG SL tablet, Place 1 tablet (0.4 mg total) under the tongue every 5 (five) minutes as needed for chest pain., Disp: 25 tablet, Rfl: 0   predniSONE (DELTASONE) 10 MG tablet, Take 4 tabs for 3 days, then 3 tabs for 3 days,  then 2 tabs for 3 days, then 1 tab for 3 days, then 1/2 tab for 4 days., Disp: 32 tablet, Rfl: 0   predniSONE (DELTASONE) 10 MG tablet, Day 1: 2 tablets before breakfast, 1 after both lunch & dinner and 2 at bedtime. Day 2: 1 tab before breakfast, 1 after both lunch & dinner and 2 at bedtime. Day 3: 1 tab at each meal & 1 at bedtime. Day 4: 1 tab at breakfast, 1 at lunch, 1 at bedtime. Day 5: 1 tab at breakfast & 1 tab at bedtime. Day 6: 1 tab at breakfast, Disp: 21 tablet, Rfl: 0   predniSONE (DELTASONE) 20 MG tablet, Take 1 tablet (20  mg total) by mouth daily with breakfast., Disp: 5 tablet, Rfl: 0   propranolol (INDERAL) 20 MG tablet, TAKE 1 TABLET BY MOUTH TWICE A DAY, Disp: 180 tablet, Rfl: 1   Respiratory Therapy Supplies (FLUTTER) DEVI, 1 Device by Does not apply route daily., Disp: 1 each, Rfl: 0   Spacer/Aero-Holding Chambers (AEROCHAMBER MV) inhaler, Use as instructed, Disp: 1 each, Rfl: 0   Allergies  Allergen Reactions   Alum & Mag Hydroxide-Simeth Diarrhea and Nausea Only   Aspirin     Nosebleed    Augmentin [Amoxicillin-Pot Clavulanate]     Palpitations    Bactrim [Sulfamethoxazole-Trimethoprim] Itching   Cefdinir Other (See Comments)    tachycardia   Chantix [Varenicline]     Bad dreams   Doxycycline Other (See Comments)    Nausea, migraine   Multaq [Dronedarone] Other (See Comments)    Lip/ mouth numbness/ tongue swelling   Ivp Dye [Iodinated Diagnostic Agents] Palpitations    ROS Review of Systems  Constitutional: Negative.   HENT: Negative.    Respiratory:  Positive for cough and shortness of breath.   Genitourinary: Negative.   Musculoskeletal: Negative.   Neurological: Negative.   Psychiatric/Behavioral: Negative.      OBJECTIVE:    Physical Exam Constitutional:      Appearance: Normal appearance.  Pulmonary:     Effort: No respiratory distress.     Breath sounds: No wheezing.  Chest:     Chest wall: No tenderness.  Musculoskeletal:        General:  Normal range of motion.  Skin:    General: Skin is warm.    BP (!) 150/88   Pulse 84   Ht 5\' 4"  (1.626 m)   Wt 149 lb 8.3 oz (67.8 kg)   BMI 25.67 kg/m  Wt Readings from Last 3 Encounters:  08/18/20 149 lb 8.3 oz (67.8 kg)  07/20/20 151 lb 14.4 oz (68.9 kg)  06/20/20 152 lb 4.8 oz (69.1 kg)    Health Maintenance Due  Topic Date Due   Pneumococcal Vaccine 30-68 Years old (1 - PCV) Never done   TETANUS/TDAP  Never done   Zoster Vaccines- Shingrix (1 of 2) Never done   MAMMOGRAM  11/26/2018   COVID-19 Vaccine (3 - Booster for Moderna series) 05/25/2020    There are no preventive care reminders to display for this patient.  CBC Latest Ref Rng & Units 06/13/2020 06/12/2020 04/02/2020  WBC 4.0 - 10.5 K/uL 11.5(H) 7.7 5.4  Hemoglobin 12.0 - 15.0 g/dL 13.5 15.1(H) 14.6  Hematocrit 36.0 - 46.0 % 40.8 45.6 44.2  Platelets 150 - 400 K/uL 257 195 229   CMP Latest Ref Rng & Units 06/13/2020 06/12/2020 04/02/2020  Glucose 70 - 99 mg/dL 158(H) 94 91  BUN 6 - 20 mg/dL 15 12 13   Creatinine 0.44 - 1.00 mg/dL 0.67 0.78 0.72  Sodium 135 - 145 mmol/L 139 137 139  Potassium 3.5 - 5.1 mmol/L 4.2 4.4 4.1  Chloride 98 - 111 mmol/L 103 101 100  CO2 22 - 32 mmol/L 28 27 28   Calcium 8.9 - 10.3 mg/dL 9.2 9.4 9.2  Total Protein 6.5 - 8.1 g/dL - - 7.5  Total Bilirubin 0.3 - 1.2 mg/dL - - 0.5  Alkaline Phos 38 - 126 U/L - - 83  AST 15 - 41 U/L - - 19  ALT 0 - 44 U/L - - 19    Lab Results  Component Value Date   TSH 2.800 06/08/2019   Lab  Results  Component Value Date   ALBUMIN 3.8 04/02/2020   ANIONGAP 8 06/13/2020   Lab Results  Component Value Date   CHOL 206 (H) 06/08/2019   CHOL 193 06/11/2016   CHOL 193 03/19/2015   HDL 39 (L) 06/08/2019   HDL 40 06/11/2016   HDL 34 (L) 03/19/2015   LDLCALC 142 (H) 06/08/2019   LDLCALC 121 (H) 06/11/2016   LDLCALC 139 (H) 03/19/2015   CHOLHDL 5.3 (H) 06/08/2019   CHOLHDL 4.8 (H) 06/11/2016   CHOLHDL 5.7 03/19/2015   Lab Results  Component  Value Date   TRIG 138 06/08/2019   Lab Results  Component Value Date   HGBA1C 5.7 (H) 06/12/2020   HGBA1C 5.6 03/19/2015      ASSESSMENT & PLAN:   Problem List Items Addressed This Visit       Other   Shortness of breath - Primary    Patient with continued sob after finishing z pack and almost done with Prednisone taper.  Plan- Chest X ray, Prednisone 20 mg 1 tab daily x 5 days. With any worsening sx call 911 or go to the ED>        Relevant Orders   DG Chest 2 View    Meds ordered this encounter  Medications   predniSONE (DELTASONE) 20 MG tablet    Sig: Take 1 tablet (20 mg total) by mouth daily with breakfast.    Dispense:  5 tablet    Refill:  0      Follow-up: No follow-ups on file.    Beckie Salts, [place] [address], [place], [place] [address]

## 2020-08-18 NOTE — Assessment & Plan Note (Signed)
Patient with continued sob after finishing z pack and almost done with Prednisone taper.  Plan- Chest X ray, Prednisone 20 mg 1 tab daily x 5 days. With any worsening sx call 911 or go to the ED>

## 2020-08-23 ENCOUNTER — Encounter: Payer: Self-pay | Admitting: Internal Medicine

## 2020-08-23 ENCOUNTER — Ambulatory Visit (INDEPENDENT_AMBULATORY_CARE_PROVIDER_SITE_OTHER): Payer: Medicare Other | Admitting: Internal Medicine

## 2020-08-23 ENCOUNTER — Other Ambulatory Visit: Payer: Self-pay

## 2020-08-23 ENCOUNTER — Other Ambulatory Visit: Payer: Self-pay | Admitting: Internal Medicine

## 2020-08-23 VITALS — BP 110/70 | HR 82 | Temp 97.9°F | Ht 64.0 in | Wt 150.4 lb

## 2020-08-23 DIAGNOSIS — F17210 Nicotine dependence, cigarettes, uncomplicated: Secondary | ICD-10-CM | POA: Diagnosis not present

## 2020-08-23 DIAGNOSIS — J449 Chronic obstructive pulmonary disease, unspecified: Secondary | ICD-10-CM

## 2020-08-23 MED ORDER — TRELEGY ELLIPTA 200-62.5-25 MCG/INH IN AEPB: 1.0000 | INHALATION_SPRAY | RESPIRATORY_TRACT | 10 refills | Status: DC

## 2020-08-23 NOTE — Patient Instructions (Signed)
Please stop smoking  Obtain overnight pulse oximetry to assess for hypoxia   obtain 6-minute walk test to assess for exertional hypoxia  Start Trelegy inhaler therapy 200   Continue albuterol as needed  Obtain pulmonary function testing to assess lung function

## 2020-08-23 NOTE — Progress Notes (Signed)
Subjective:    Patient ID: Andrea [name], female    DOB: 16-Oct-1965, 55 y.o.   MRN: [id number]  CC  Follow up SOB and COUGH Follow up COPD   HPI  55 year old current smoker 1 pack a day for 30 years  Multiple admissions for COPD exacerbation  FEV1 is noted to 60% in 2017 -findings relayed to patient in detail   No exacerbation at this time No evidence of heart failure at this time No evidence or signs of infection at this time No respiratory distress No fevers, chills, nausea, vomiting, diarrhea No evidence of lower extremity edema No evidence hemoptysis       Patient Active Problem List   Diagnosis Date Noted   Acute hypoxemic respiratory failure ([place]) 06/13/2020   Personal history of colonic polyps    Polyp of transverse colon    Varicose veins of bilateral lower extremities with pain 11/22/2016   Restless leg 11/18/2016   Iron deficiency 11/18/2016   Allergic rhinitis 06/11/2016   Hyperlipidemia 03/19/2015   COPD exacerbation ([place]) 10/04/2014   Depression 09/22/2014   Dyshidrosis 09/22/2014   GERD (gastroesophageal reflux disease) 09/22/2014   Insomnia 09/22/2014   Lung nodule, multiple 09/22/2014   Panic disorder 09/22/2014   Palpitations 01/17/2014   Pulmonary nodule 06/10/2013   Shortness of breath 05/20/2013   COPD, GOLD B 05/20/2013   Tobacco abuse 05/20/2013   Daytime somnolence 05/20/2013   Back pain 08/28/2012   Discoid lupus 05/30/2012   Generalized abdominal pain 03/11/2012   Pain in joint 03/09/2012   Psoriasis 02/21/2012   Fibromyalgia 02/11/2012   Lupus ([place]) 02/11/2012   Panic attacks 02/11/2012   History of adenomatous polyp of colon 09/13/2011   Heartburn 08/06/2011   PVC's (premature ventricular contractions) 01/29/2011   Essential (primary) hypertension 03/04/1998    Allergies  Allergen Reactions   Alum & Mag Hydroxide-Simeth Diarrhea and Nausea Only   Aspirin     Nosebleed    Augmentin [Amoxicillin-Pot Clavulanate]      Palpitations    Bactrim [Sulfamethoxazole-Trimethoprim] Itching   Cefdinir Other (See Comments)    tachycardia   Chantix [Varenicline]     Bad dreams   Doxycycline Other (See Comments)    Nausea, migraine   Multaq [Dronedarone] Other (See Comments)    Lip/ mouth numbness/ tongue swelling   Ivp Dye [Iodinated Diagnostic Agents] Palpitations    Current Meds  Medication Sig   albuterol (PROVENTIL) (2.5 MG/3ML) 0.083% nebulizer solution Take 3 mLs (2.5 mg total) by nebulization every 4 (four) hours as needed for wheezing or shortness of breath.   albuterol (PROVENTIL) (2.5 MG/3ML) 0.083% nebulizer solution Take 3 mLs (2.5 mg total) by nebulization every 6 (six) hours as needed for wheezing or shortness of breath.   albuterol (VENTOLIN HFA) 108 (90 Base) MCG/ACT inhaler Inhale 2 puffs into the lungs every 6 (six) hours as needed for wheezing or shortness of breath.   benzonatate (TESSALON) 200 MG capsule Take 1 capsule (200 mg total) by mouth 3 (three) times daily as needed.   Budeson-Glycopyrrol-Formoterol (BREZTRI AEROSPHERE) 160-9-4.8 MCG/ACT AERO Inhale 2 puffs into the lungs in the morning and at bedtime.   clonazePAM (KLONOPIN) 1 MG tablet Take 1 tablet (1 mg total) by mouth 2 (two) times daily as needed.   dexlansoprazole (DEXILANT) 60 MG capsule Take 1 capsule (60 mg total) by mouth daily.   fluticasone (FLONASE) 50 MCG/ACT nasal spray Place 2 sprays into both nostrils daily.   hydroxychloroquine (PLAQUENIL) 200 MG  tablet TAKE 2 TABLETS (400 MG TOTAL) BY MOUTH DAILY. FOR DISCOID LUPUS (L93.0)   Misc. Devices (PULSE OXIMETER DELUXE) MISC Use daily to monitor oxygen levels   nitroGLYCERIN (NITROSTAT) 0.4 MG SL tablet Place 1 tablet (0.4 mg total) under the tongue every 5 (five) minutes as needed for chest pain.   predniSONE (DELTASONE) 10 MG tablet Take 4 tabs for 3 days, then 3 tabs for 3 days, then 2 tabs for 3 days, then 1 tab for 3 days, then 1/2 tab for 4 days.   Respiratory Therapy  Supplies (FLUTTER) DEVI 1 Device by Does not apply route daily.   Spacer/Aero-Holding Chambers (AEROCHAMBER MV) inhaler Use as instructed      Review of Systems:  Gen:  Denies  fever, sweats, chills weight loss  HEENT: Denies blurred vision, double vision, ear pain, eye pain, hearing loss, nose bleeds, sore throat Cardiac:  No dizziness, chest pain or heaviness, chest tightness,edema, No JVD Resp:  +cough, -sputum production, +shortness of breath,+wheezing, -hemoptysis,  Gi: Denies swallowing difficulty, stomach pain, nausea or vomiting, diarrhea, constipation, bowel incontinence Gu:  Denies bladder incontinence, burning urine Ext:   Denies Joint pain, stiffness or swelling Skin: Denies  skin rash, easy bruising or bleeding or hives Endoc:  Denies polyuria, polydipsia , polyphagia or weight change Psych:   Denies depression, insomnia or hallucinations  Other:  All other systems negative Objective:   Physical Exam BP 110/70 (BP Location: Left Arm, Patient Position: Sitting, Cuff Size: Normal)   Pulse 82   Temp 97.9 F (36.6 C) (Oral)   Ht 5\' 4"  (1.626 m)   Wt 150 lb 6.4 oz (68.2 kg)   SpO2 99%   BMI 25.82 kg/m  Physical Examination:   General Appearance: No distress  Neuro:without focal findings,  speech normal,  HEENT: PERRLA, EOM intact.   Pulmonary: normal breath sounds, No wheezing.  CardiovascularNormal S1,S2.  No m/r/g.   Abdomen: Benign, Soft, non-tender. Renal:  No costovertebral tenderness     ALL OTHER ROS ARE NEGATIVE   CT scan of the chest performed prior to discharge showed emphysematous changes with no acute infiltrate.         ASSESSMENT AND PLAN  55 year old white female with a history of moderate COPD based on pulmonary function testing in 2017 with FEV1 of 60% with ongoing tobacco abuse with multiple admissions for COPD exacerbation most recent COPD exacerbation was in April 2022  Patient continues to smoke tobacco Patient has a history of being  noncompliant in the past and missing appointments  At this time patient has a diagnosis of moderate to severe COPD with Gold stage D I have advised patient to change inhaler therapy to Trelegy 200 Continue albuterol as needed Obtain pulmonary function testing to evaluate lung function   Smoking cessation strongly advised   Shortness of breath and dyspnea exertion worsening over the last 1 year Check overnight pulse oximetry for nocturnal hypoxia Check 6-minute walk test for exertional hypoxia   Reestablish care with lung cancer screening program   COVID-19 EDUCATION: The signs and symptoms of COVID-19 were discussed with the patient. Importance of hand Hygiene and Social Distancing   MEDICATION ADJUSTMENTS/LABS AND TESTS ORDERED: Please stop smoking  Obtain overnight pulse oximetry to assess for hypoxia   obtain 6-minute walk test to assess for exertional hypoxia  Start Trelegy inhaler therapy 200   Continue albuterol as needed  Obtain pulmonary function testing to assess lung function   Reestablish care with lung cancer screening  program  CURRENT MEDICATIONS REVIEWED AT LENGTH WITH PATIENT TODAY   Patient satisfied with Plan of action and management. All questions answered  Follow up 3 months  Total time spent 32 minutes   [name], M.D.  [name]  Medical Director [place] Director [facility] Cardio-Pulmonary Department

## 2020-08-25 ENCOUNTER — Ambulatory Visit: Payer: Medicare Other | Admitting: Family Medicine

## 2020-08-31 DIAGNOSIS — G4730 Sleep apnea, unspecified: Secondary | ICD-10-CM | POA: Diagnosis not present

## 2020-08-31 DIAGNOSIS — R0683 Snoring: Secondary | ICD-10-CM | POA: Diagnosis not present

## 2020-09-07 ENCOUNTER — Telehealth: Payer: Self-pay | Admitting: Internal Medicine

## 2020-09-07 NOTE — Telephone Encounter (Signed)
ONO reviewed by Dr. Boykin Peek oxygen needed.   Patient is aware of results and voiced her understanding. Nothing further needed at this time.

## 2020-09-08 ENCOUNTER — Telehealth: Payer: Self-pay | Admitting: Internal Medicine

## 2020-09-08 NOTE — Telephone Encounter (Signed)
Routing to Dr. Kasa as an FYI ?

## 2020-09-08 NOTE — Telephone Encounter (Signed)
When I was scheduling a PFT today with Pamala Hurry she informed me that Mrs. Hinckley called today and CXL her PFT appt again. Pamala Hurry also told her that she needed to inform her doctor

## 2020-09-12 ENCOUNTER — Ambulatory Visit: Payer: Medicare Other | Admitting: Gastroenterology

## 2020-09-12 ENCOUNTER — Encounter: Payer: Self-pay | Admitting: Gastroenterology

## 2020-09-20 ENCOUNTER — Other Ambulatory Visit: Admission: RE | Admit: 2020-09-20 | Payer: Medicare Other | Source: Ambulatory Visit

## 2020-09-21 ENCOUNTER — Ambulatory Visit: Payer: Medicare Other

## 2020-09-29 ENCOUNTER — Encounter: Payer: Self-pay | Admitting: Family Medicine

## 2020-09-29 ENCOUNTER — Telehealth (INDEPENDENT_AMBULATORY_CARE_PROVIDER_SITE_OTHER): Payer: Medicare Other | Admitting: Family Medicine

## 2020-09-29 DIAGNOSIS — R0602 Shortness of breath: Secondary | ICD-10-CM | POA: Diagnosis not present

## 2020-09-29 DIAGNOSIS — G2581 Restless legs syndrome: Secondary | ICD-10-CM

## 2020-09-29 DIAGNOSIS — M329 Systemic lupus erythematosus, unspecified: Secondary | ICD-10-CM | POA: Diagnosis not present

## 2020-09-29 DIAGNOSIS — I10 Essential (primary) hypertension: Secondary | ICD-10-CM

## 2020-09-29 DIAGNOSIS — R002 Palpitations: Secondary | ICD-10-CM

## 2020-09-29 NOTE — Progress Notes (Signed)
MyChart Video Visit    Virtual Visit via Video Note   This visit type was conducted due to national recommendations for restrictions regarding the COVID-19 Pandemic (e.g. social distancing) in an effort to limit this patient's exposure and mitigate transmission in our community. This patient is at least at moderate risk for complications without adequate follow up. This format is felt to be most appropriate for this patient at this time. Physical exam was limited by quality of the video and audio technology used for the visit.   Patient location: home Provider location: [facility]  I discussed the limitations of evaluation and management by telemedicine and the availability of in person appointments. The patient expressed understanding and agreed to proceed.  Patient: Andrea [name]   DOB: [birth year]   55 y.o. Female  MRN: [id number] Visit Date: 09/29/2020  Today's healthcare provider: Lelon Huh, MD   No chief complaint on file.  Subjective    HPI  Follow up for COPD:  The patient was last seen for this 5 months ago. Changes made at last visit include; advised to continue Breztri and continue albuterol nebs and HFA prn. She has since had Breztri changed to Trelegy by Dr. Mortimer Fries. She continues to have trouble breathing since having Covid earlier this year.   She reports good compliance with treatment. She feels that condition is Improved. She is not having side effects. none  ----------------------------------------------------------------------------------------- Follow up for Discoid lupus:  The patient was last seen for this 5 months ago. Changes made at last visit include; She is having a bit of a skin breakout and swelling of her right ankle. Advised she needs to get back in with rheumatology to evaluate.    She reports good compliance with treatment. She feels that condition is Improved. She is not having side effects.  none  -----------------------------------------------------------------------------------------     Medications: Outpatient Medications Prior to Visit  Medication Sig   albuterol (PROVENTIL) (2.5 MG/3ML) 0.083% nebulizer solution Take 3 mLs (2.5 mg total) by nebulization every 4 (four) hours as needed for wheezing or shortness of breath.   albuterol (PROVENTIL) (2.5 MG/3ML) 0.083% nebulizer solution Take 3 mLs (2.5 mg total) by nebulization every 6 (six) hours as needed for wheezing or shortness of breath.   albuterol (VENTOLIN HFA) 108 (90 Base) MCG/ACT inhaler Inhale 2 puffs into the lungs every 6 (six) hours as needed for wheezing or shortness of breath.   benzonatate (TESSALON) 200 MG capsule Take 1 capsule (200 mg total) by mouth 3 (three) times daily as needed.   Budeson-Glycopyrrol-Formoterol (BREZTRI AEROSPHERE) 160-9-4.8 MCG/ACT AERO Inhale 2 puffs into the lungs in the morning and at bedtime.   citalopram (CELEXA) 10 MG tablet TAKE 1 TABLET BY MOUTH EVERY DAY (Patient not taking: Reported on 08/23/2020)   clonazePAM (KLONOPIN) 1 MG tablet Take 1 tablet (1 mg total) by mouth 2 (two) times daily as needed.   dexlansoprazole (DEXILANT) 60 MG capsule Take 1 capsule (60 mg total) by mouth daily.   fluticasone (FLONASE) 50 MCG/ACT nasal spray Place 2 sprays into both nostrils daily.   hydroxychloroquine (PLAQUENIL) 200 MG tablet TAKE 2 TABLETS (400 MG TOTAL) BY MOUTH DAILY. FOR DISCOID LUPUS (L93.0)   Misc. Devices (PULSE OXIMETER DELUXE) MISC Use daily to monitor oxygen levels   nitroGLYCERIN (NITROSTAT) 0.4 MG SL tablet Place 1 tablet (0.4 mg total) under the tongue every 5 (five) minutes as needed for chest pain.   predniSONE (DELTASONE) 10 MG tablet Take 4 tabs for  3 days, then 3 tabs for 3 days, then 2 tabs for 3 days, then 1 tab for 3 days, then 1/2 tab for 4 days.   predniSONE (DELTASONE) 10 MG tablet Day 1: 2 tablets before breakfast, 1 after both lunch & dinner and 2 at bedtime.  Day 2: 1 tab before breakfast, 1 after both lunch & dinner and 2 at bedtime. Day 3: 1 tab at each meal & 1 at bedtime. Day 4: 1 tab at breakfast, 1 at lunch, 1 at bedtime. Day 5: 1 tab at breakfast & 1 tab at bedtime. Day 6: 1 tab at breakfast (Patient not taking: Reported on 08/23/2020)   predniSONE (DELTASONE) 20 MG tablet Take 1 tablet (20 mg total) by mouth daily with breakfast. (Patient not taking: Reported on 08/23/2020)   propranolol (INDERAL) 20 MG tablet TAKE 1 TABLET BY MOUTH TWICE A DAY (Patient not taking: Reported on 08/23/2020)   Respiratory Therapy Supplies (FLUTTER) DEVI 1 Device by Does not apply route daily.   Spacer/Aero-Holding Chambers (AEROCHAMBER MV) inhaler Use as instructed   TRELEGY ELLIPTA 200-62.5-25 MCG/INH AEPB INHALE 1 PUFF INTO THE LUNGS FOR 1 DAY OR 1 DOSE FOR 1 DOSE.   No facility-administered medications prior to visit.    Review of Systems  Constitutional:  Negative for appetite change, chills, fatigue and fever.  Respiratory:  Negative for chest tightness and shortness of breath.   Cardiovascular:  Negative for chest pain and palpitations.  Gastrointestinal:  Negative for abdominal pain, nausea and vomiting.  Neurological:  Negative for dizziness and weakness.     Objective       Physical Exam   Awake, alert, oriented x 3. In no apparent distress   Assessment & Plan     1. Shortness of breath Due to chronic COPD and exacerbated by Covid and unusually hot weather. Recommend continue Trelegy prescribed by Dr. Mortimer Fries. Expect some improvement the further out she gets from Covid and with cooler weather in the fall.   2. Essential (primary) hypertension Well controlled.  Continue current medications.    3. Palpitations Mostly resolved and rarely needs to take propranolol.   4. Lupus ([place]) Quiescent  5. Restless leg Doing well with clonazepam.        I discussed the assessment and treatment plan with the patient. The patient was provided an  opportunity to ask questions and all were answered. The patient agreed with the plan and demonstrated an understanding of the instructions.   The patient was advised to call back or seek an in-person evaluation if the symptoms worsen or if the condition fails to improve as anticipated.  I provided 12 minutes of non-face-to-face time during this encounter.  The entirety of the information documented in the History of Present Illness, Review of Systems and Physical Exam were personally obtained by me. Portions of this information were initially documented by the CMA and reviewed by me for thoroughness and accuracy.    Lelon Huh, MD [facility] [phone] (phone) [phone] (fax)  [place]

## 2020-09-29 NOTE — Patient Instructions (Signed)
Please review the attached list of medications and notify my office if there are any errors.   Please bring all of your medications to every appointment so we can make sure that our medication list is the same as yours.  Please call the [facility] at [facility] at [phone] to schedule your mammogram.

## 2020-10-11 ENCOUNTER — Other Ambulatory Visit: Payer: Self-pay | Admitting: Gastroenterology

## 2020-10-17 ENCOUNTER — Encounter: Payer: Self-pay | Admitting: Gastroenterology

## 2020-10-17 ENCOUNTER — Other Ambulatory Visit: Payer: Self-pay

## 2020-10-17 ENCOUNTER — Ambulatory Visit: Payer: Medicaid Other | Admitting: Family Medicine

## 2020-10-17 ENCOUNTER — Ambulatory Visit (INDEPENDENT_AMBULATORY_CARE_PROVIDER_SITE_OTHER): Payer: Medicare Other | Admitting: Gastroenterology

## 2020-10-17 VITALS — BP 118/73 | HR 82 | Ht 64.0 in | Wt 149.0 lb

## 2020-10-17 DIAGNOSIS — K219 Gastro-esophageal reflux disease without esophagitis: Secondary | ICD-10-CM | POA: Diagnosis not present

## 2020-10-17 MED ORDER — DEXLANSOPRAZOLE 60 MG PO CPDR
60.0000 mg | DELAYED_RELEASE_CAPSULE | Freq: Every day | ORAL | 3 refills | Status: DC
Start: 2020-10-17 — End: 2021-05-08

## 2020-10-17 NOTE — Progress Notes (Signed)
Primary Care Physician: Cletis Athens, MD  Primary Gastroenterologist:  Dr. Lucilla Lame  No chief complaint on file.   HPI: Andrea [name] is a 55 y.o. female here for follow-up of her GERD with a need for medication refill.  The patient had last seen me in April 2020 and now comes in with a report of needing a refill for her Dexilant.  The patient had been doing well in the past on the [place]. The patient reports that she is doing well except for some acid breakthrough intermittently.  She states it happens approximately once a month.  There is no report of any dysphagia unexplained weight loss fevers chills nausea vomiting black stools or bloody stools.  She states she has been in her normal state of health without any complaints.  Past Medical History:  Diagnosis Date   Anxiety    panic attacks, chest pain   Arthritis    COPD (chronic obstructive pulmonary disease) (HCC)    GERD (gastroesophageal reflux disease)    Hypertension    not medicated   Non-obstructive CAD in native artery    a. cardiac cath 01/2014: showed minor irregularities, mildly elevated left ventricular end-diastolic pressure and normal ejection fraction; b. 03/2015 MV: low risk.   Palpitations    a. 08/2016 Event Monitor: occas PVC's, two brief runs of SVT (4 and 7 beats).   PONV (postoperative nausea and vomiting)    Symptomatic PVC's (premature ventricular contractions)    Tobacco abuse    Wears dentures    partial upper and lower    Current Outpatient Medications  Medication Sig Dispense Refill   albuterol (PROVENTIL) (2.5 MG/3ML) 0.083% nebulizer solution Take 3 mLs (2.5 mg total) by nebulization every 4 (four) hours as needed for wheezing or shortness of breath. 75 mL 2   albuterol (PROVENTIL) (2.5 MG/3ML) 0.083% nebulizer solution Take 3 mLs (2.5 mg total) by nebulization every 6 (six) hours as needed for wheezing or shortness of breath. (Patient not taking: Reported on 09/29/2020) 120 mL 2    albuterol (VENTOLIN HFA) 108 (90 Base) MCG/ACT inhaler Inhale 2 puffs into the lungs every 6 (six) hours as needed for wheezing or shortness of breath. 18 g 6   clonazePAM (KLONOPIN) 1 MG tablet Take 1 tablet (1 mg total) by mouth 2 (two) times daily as needed. 60 tablet 3   dexlansoprazole (DEXILANT) 60 MG capsule Take 1 capsule (60 mg total) by mouth daily. 90 capsule 0   fluticasone (FLONASE) 50 MCG/ACT nasal spray Place 2 sprays into both nostrils daily. 16 g 6   hydroxychloroquine (PLAQUENIL) 200 MG tablet TAKE 2 TABLETS (400 MG TOTAL) BY MOUTH DAILY. FOR DISCOID LUPUS (L93.0) 180 tablet 0   Misc. Devices (PULSE OXIMETER DELUXE) MISC Use daily to monitor oxygen levels 1 each 0   nitroGLYCERIN (NITROSTAT) 0.4 MG SL tablet Place 1 tablet (0.4 mg total) under the tongue every 5 (five) minutes as needed for chest pain. 25 tablet 0   propranolol (INDERAL) 20 MG tablet TAKE 1 TABLET BY MOUTH TWICE A DAY (Patient not taking: No sig reported) 180 tablet 1   Respiratory Therapy Supplies (FLUTTER) DEVI 1 Device by Does not apply route daily. 1 each 0   Spacer/Aero-Holding Chambers (AEROCHAMBER MV) inhaler Use as instructed 1 each 0   TRELEGY ELLIPTA 200-62.5-25 MCG/INH AEPB INHALE 1 PUFF INTO THE LUNGS FOR 1 DAY OR 1 DOSE FOR 1 DOSE. 60 each 10   No current facility-administered medications for this  visit.    Allergies as of 10/17/2020 - Review Complete 09/29/2020  Allergen Reaction Noted   Alum & mag hydroxide-simeth Diarrhea and Nausea Only 11/02/2014   Aspirin  07/23/2018   Augmentin [amoxicillin-pot clavulanate]  05/22/2020   Bactrim [sulfamethoxazole-trimethoprim] Itching 01/17/2020   Cefdinir Other (See Comments) 07/06/2013   Chantix [varenicline]  06/11/2016   Doxycycline Other (See Comments) 11/18/2013   Multaq [dronedarone] Other (See Comments) 07/22/2017   Ivp dye [iodinated diagnostic agents] Palpitations 05/20/2013    ROS:  General: Negative for anorexia, weight loss, fever,  chills, fatigue, weakness. ENT: Negative for hoarseness, difficulty swallowing , nasal congestion. CV: Negative for chest pain, angina, palpitations, dyspnea on exertion, peripheral edema.  Respiratory: Negative for dyspnea at rest, dyspnea on exertion, cough, sputum, wheezing.  GI: See history of present illness. GU:  Negative for dysuria, hematuria, urinary incontinence, urinary frequency, nocturnal urination.  Endo: Negative for unusual weight change.    Physical Examination:   There were no vitals taken for this visit.  General: Well-nourished, well-developed in no acute distress.  Eyes: No icterus. Conjunctivae pink. Lungs: Clear to auscultation bilaterally. Non-labored. Heart: Regular rate and rhythm, no murmurs rubs or gallops.  Abdomen: Bowel sounds are normal, nontender, nondistended, no hepatosplenomegaly or masses, no abdominal bruits or hernia , no rebound or guarding.   Extremities: No lower extremity edema. No clubbing or deformities. Neuro: Alert and oriented x 3.  Grossly intact. Skin: Warm and dry, no jaundice.   Psych: Alert and cooperative, normal mood and affect.  Labs:    Imaging Studies: No results found.  Assessment and Plan:   Andrea [name] is a 55 y.o. y/o female Who comes in today with a history of gastroesophageal reflux who is doing relatively well on Dexilant.  The patient is now here for a follow-up and for a refill of her medication. The patient is not due for a repeat colonoscopy at this time.  The patient will have her medication refilled and will follow up as needed.  The patient has been explained the plan and agrees with it.     Lucilla Lame, MD. [name]    Note: This dictation was prepared with Dragon dictation along with smaller phrase technology. Any transcriptional errors that result from this process are unintentional.

## 2020-10-27 ENCOUNTER — Other Ambulatory Visit: Payer: Self-pay | Admitting: Pulmonary Disease

## 2020-11-03 ENCOUNTER — Telehealth: Payer: Medicare Other | Admitting: Physician Assistant

## 2020-11-03 DIAGNOSIS — J441 Chronic obstructive pulmonary disease with (acute) exacerbation: Secondary | ICD-10-CM | POA: Diagnosis not present

## 2020-11-03 MED ORDER — BENZONATATE 100 MG PO CAPS: 100.0000 mg | ORAL_CAPSULE | Freq: Three times a day (TID) | ORAL | 0 refills | Status: DC | PRN

## 2020-11-03 MED ORDER — PREDNISONE 10 MG (21) PO TBPK: ORAL_TABLET | ORAL | 0 refills | Status: DC

## 2020-11-03 MED ORDER — AZITHROMYCIN 250 MG PO TABS: ORAL_TABLET | ORAL | 0 refills | Status: DC

## 2020-11-03 NOTE — Progress Notes (Signed)
We are sorry that you are not feeling well.  Here is how we plan to help!  Based on your presentation I believe you most likely have A cough due to bacteria.  When patients have a fever and a productive cough with a change in color or increased sputum production, we are concerned about bacterial bronchitis.  If left untreated it can progress to pneumonia.  If your symptoms do not improve with your treatment plan it is important that you contact your provider.   I have prescribed Azithromyin 250 mg: two tablets now and then one tablet daily for 4 additonal days    In addition you may use A prescription cough medication called Tessalon Perles 100mg. You may take 1-2 capsules every 8 hours as needed for your cough.  Prednisone 10 mg daily for 6 days (see taper instructions below)  Directions for 6 day taper: Day 1: 2 tablets before breakfast, 1 after both lunch & dinner and 2 at bedtime Day 2: 1 tab before breakfast, 1 after both lunch & dinner and 2 at bedtime Day 3: 1 tab at each meal & 1 at bedtime Day 4: 1 tab at breakfast, 1 at lunch, 1 at bedtime Day 5: 1 tab at breakfast & 1 tab at bedtime Day 6: 1 tab at breakfast  From your responses in the eVisit questionnaire you describe inflammation in the upper respiratory tract which is causing a significant cough.  This is commonly called Bronchitis and has four common causes:   Allergies Viral Infections Acid Reflux Bacterial Infection Allergies, viruses and acid reflux are treated by controlling symptoms or eliminating the cause. An example might be a cough caused by taking certain blood pressure medications. You stop the cough by changing the medication. Another example might be a cough caused by acid reflux. Controlling the reflux helps control the cough.  USE OF BRONCHODILATOR ("RESCUE") INHALERS: There is a risk from using your bronchodilator too frequently.  The risk is that over-reliance on a medication which only relaxes the muscles  surrounding the breathing tubes can reduce the effectiveness of medications prescribed to reduce swelling and congestion of the tubes themselves.  Although you feel brief relief from the bronchodilator inhaler, your asthma may actually be worsening with the tubes becoming more swollen and filled with mucus.  This can delay other crucial treatments, such as oral steroid medications. If you need to use a bronchodilator inhaler daily, several times per day, you should discuss this with your provider.  There are probably better treatments that could be used to keep your asthma under control.     HOME CARE Only take medications as instructed by your medical team. Complete the entire course of an antibiotic. Drink plenty of fluids and get plenty of rest. Avoid close contacts especially the very young and the elderly Cover your mouth if you cough or cough into your sleeve. Always remember to wash your hands A steam or ultrasonic humidifier can help congestion.   GET HELP RIGHT AWAY IF: You develop worsening fever. You become short of breath You cough up blood. Your symptoms persist after you have completed your treatment plan MAKE SURE YOU  Understand these instructions. Will watch your condition. Will get help right away if you are not doing well or get worse.    Thank you for choosing an e-visit.  Your e-visit answers were reviewed by a board certified advanced clinical practitioner to complete your personal care plan. Depending upon the condition, your plan could   have included both over the counter or prescription medications.  Please review your pharmacy choice. Make sure the pharmacy is open so you can pick up prescription now. If there is a problem, you may contact your provider through [employer] and have the prescription routed to another pharmacy.  Your safety is important to [place]. If you have drug allergies check your prescription carefully.   For the next 24 hours you can use  MyChart to ask questions about today's visit, request a non-urgent call back, or ask for a work or school excuse. You will get an email in the next two days asking about your experience. I hope that your e-visit has been valuable and will speed your recovery.  I provided 6 minutes of non face-to-face time during this encounter for chart review and documentation.

## 2020-11-12 ENCOUNTER — Encounter: Payer: Self-pay | Admitting: Family Medicine

## 2020-11-13 NOTE — Telephone Encounter (Signed)
LOV: 09/29/2020 NOV: None  Last Refill: 07/20/2020 #60 3 Refills.   Thanks,   -Mickel Baas

## 2020-11-14 ENCOUNTER — Other Ambulatory Visit: Payer: Self-pay | Admitting: *Deleted

## 2020-11-14 MED ORDER — CLONAZEPAM 1 MG PO TABS: 1.0000 mg | ORAL_TABLET | Freq: Two times a day (BID) | ORAL | 0 refills | Status: DC | PRN

## 2020-11-14 MED ORDER — CLONAZEPAM 1 MG PO TABS: 1.0000 mg | ORAL_TABLET | Freq: Two times a day (BID) | ORAL | 3 refills | Status: DC | PRN

## 2020-11-22 ENCOUNTER — Telehealth: Payer: Self-pay | Admitting: Internal Medicine

## 2020-11-22 MED ORDER — AZITHROMYCIN 250 MG PO TABS: ORAL_TABLET | ORAL | 0 refills | Status: AC

## 2020-11-22 MED ORDER — PREDNISONE 20 MG PO TABS: 20.0000 mg | ORAL_TABLET | Freq: Every day | ORAL | 0 refills | Status: DC

## 2020-11-22 NOTE — Telephone Encounter (Signed)
Patient is aware of recommendations and voiced her understanding.  Prednisone and zpak has been sent to preferred pharmacy. Nothing further needed.  

## 2020-11-22 NOTE — Telephone Encounter (Signed)
Called and spoke to patient.  C/o increased sob with exertion, chest tightness and wheezing at night. Sx have been present for 2 weeks. Denied f/c/s or additional sx.  She does not wear supplemental oxygen. Lowest spo2 92%. Using trelegy BID and neb solution QID.  2 covid vaccines.  No recent covid test.  Dr. Mortimer Fries ,please advise. Thanks

## 2020-11-28 ENCOUNTER — Telehealth: Payer: Self-pay | Admitting: Internal Medicine

## 2020-11-28 DIAGNOSIS — J441 Chronic obstructive pulmonary disease with (acute) exacerbation: Secondary | ICD-10-CM

## 2020-11-28 DIAGNOSIS — J449 Chronic obstructive pulmonary disease, unspecified: Secondary | ICD-10-CM

## 2020-11-28 MED ORDER — ALBUTEROL SULFATE (2.5 MG/3ML) 0.083% IN NEBU
INHALATION_SOLUTION | RESPIRATORY_TRACT | 11 refills | Status: AC
Start: 2020-11-28 — End: ?

## 2020-11-28 MED ORDER — COMPRESSOR/NEBULIZER MISC: 1 refills | Status: DC

## 2020-11-28 MED ORDER — TRELEGY ELLIPTA 200-62.5-25 MCG/INH IN AEPB: INHALATION_SPRAY | RESPIRATORY_TRACT | 11 refills | Status: DC

## 2020-11-28 MED ORDER — ALBUTEROL SULFATE HFA 108 (90 BASE) MCG/ACT IN AERS: 2.0000 | INHALATION_SPRAY | Freq: Four times a day (QID) | RESPIRATORY_TRACT | 6 refills | Status: DC | PRN

## 2020-11-28 MED ORDER — ALBUTEROL SULFATE (2.5 MG/3ML) 0.083% IN NEBU: INHALATION_SOLUTION | RESPIRATORY_TRACT | 11 refills | Status: DC

## 2020-11-28 NOTE — Telephone Encounter (Signed)
Albuterol solution, albuterol HFA, trelegy and nebulizer supplies has been sent to [facility] per patient request.  Patient is aware and voiced her understanding.  Nothing further needed at this time.

## 2020-11-28 NOTE — Telephone Encounter (Signed)
Dx code has been added to Rx. Rx resent to pharmacy.  Debbie with [facility] is aware and voiced her understanding.  Nothing further needed.

## 2020-11-28 NOTE — Addendum Note (Signed)
Addended by: Claudette Head A on: 11/28/2020 11:18 AM   Modules accepted: Orders

## 2020-11-29 ENCOUNTER — Telehealth: Payer: Self-pay | Admitting: Internal Medicine

## 2020-11-29 DIAGNOSIS — J449 Chronic obstructive pulmonary disease, unspecified: Secondary | ICD-10-CM

## 2020-11-30 MED ORDER — TRELEGY ELLIPTA 200-62.5-25 MCG/INH IN AEPB: 1.0000 | INHALATION_SPRAY | Freq: Every day | RESPIRATORY_TRACT | 11 refills | Status: DC

## 2020-11-30 NOTE — Telephone Encounter (Signed)
Spoke to patient, and requested that she contact insurance for a list of covered medications.  She will call back with update.

## 2020-11-30 NOTE — Telephone Encounter (Signed)
Per patient, insurance covered, albuterol, symbicort and Breo.  Dr. Mortimer Fries, please advise. Thanks

## 2020-11-30 NOTE — Telephone Encounter (Signed)
Patient states insurance covers inhalers Albuterol, Symbicort and Breo. Patient would like Albuterol solution for Nebulizer. Patient phone number is [phone].

## 2020-11-30 NOTE — Telephone Encounter (Signed)
Spoke to [place] with [facility] and clarified trelegy sig and dosage. Janett Billow stated that Ronni Rumble is not affordable for patient. She would like to switch to duo neb, perforomist and Pulmicort.   Dr. Mortimer Fries, please advise. Thanks

## 2020-12-01 MED ORDER — FLUTICASONE FUROATE-VILANTEROL 200-25 MCG/INH IN AEPB: 1.0000 | INHALATION_SPRAY | Freq: Every day | RESPIRATORY_TRACT | 12 refills | Status: DC

## 2020-12-01 MED ORDER — COMPRESSOR/NEBULIZER MISC: 0 refills | Status: AC

## 2020-12-01 MED ORDER — COMPRESSOR/NEBULIZER MISC: 0 refills | Status: DC

## 2020-12-01 MED ORDER — FLUTICASONE FUROATE-VILANTEROL 200-25 MCG/INH IN AEPB: 1.0000 | INHALATION_SPRAY | Freq: Every day | RESPIRATORY_TRACT | 12 refills | Status: AC

## 2020-12-01 NOTE — Telephone Encounter (Signed)
Andrea [name] from [facility] is checking on prescription for Nebulizer solution. Andrea [name] phone number is [phone].

## 2020-12-01 NOTE — Telephone Encounter (Signed)
Rx for Breo 200 has been sent to preferred pharmacy.  Patient is aware and voiced her understanding.  Nothing further needed at this time.

## 2020-12-01 NOTE — Telephone Encounter (Signed)
Patient needs prescription for Nebulizer machine and supplies. Patient uses [facility] for Nebulizer. Patient phone number is [phone].

## 2020-12-01 NOTE — Telephone Encounter (Signed)
Nebulizer has been sent to preferred pharmacy. Patient is aware and voiced his understanding.   Dr. Mortimer Fries, please advise on inhaler.

## 2020-12-01 NOTE — Telephone Encounter (Signed)
Andrea [name] with [facility] is aware of below message and voiced her understanding.  Gave approval for 30 days supply of albuterol solution.

## 2020-12-07 ENCOUNTER — Ambulatory Visit (INDEPENDENT_AMBULATORY_CARE_PROVIDER_SITE_OTHER): Payer: Medicare Other | Admitting: Internal Medicine

## 2020-12-07 ENCOUNTER — Telehealth: Payer: Self-pay | Admitting: Internal Medicine

## 2020-12-07 ENCOUNTER — Other Ambulatory Visit: Payer: Self-pay

## 2020-12-07 ENCOUNTER — Encounter: Payer: Self-pay | Admitting: Internal Medicine

## 2020-12-07 VITALS — BP 122/78 | HR 83 | Temp 98.1°F | Ht 63.0 in | Wt 145.8 lb

## 2020-12-07 DIAGNOSIS — J449 Chronic obstructive pulmonary disease, unspecified: Secondary | ICD-10-CM

## 2020-12-07 MED ORDER — FLUTICASONE FUROATE-VILANTEROL 200-25 MCG/INH IN AEPB: 1.0000 | INHALATION_SPRAY | Freq: Two times a day (BID) | RESPIRATORY_TRACT | 5 refills | Status: DC

## 2020-12-07 NOTE — Patient Instructions (Addendum)
Please stop smoking  Prednisone 20 mg daily for 10 days  Flutter valve use 10-15 times per day  Continue inhalers as prescribed  Reestablish care with lung cancer screening program

## 2020-12-07 NOTE — Progress Notes (Signed)
Subjective:    Patient ID: Andrea [name], female    DOB: 22-May-1965, 55 y.o.   MRN: [id number]  CC  Follow up SOB and COUGH Follow up COPD   HPI  55 year old current smoker 1 pack a day for 30 years Multiple admissions for COPD exacerbation  FEV1 was noted to be 60% 2017  This was relayed to patient in detail  No exacerbation at this time No evidence of heart failure at this time No evidence or signs of infection at this time No respiratory distress No fevers, chills, nausea, vomiting, diarrhea No evidence of lower extremity edema No evidence hemoptysis  Patient continues to smoke Patient has not been using her Breo inhaler as prescribed Patient states short of breath is intermittent wheezing Constant and chronic symptoms of shortness of breath and dyspnea on exertion  Smoking Assessment and Cessation Counseling Upon further questioning, Patient smokes 1 ppd I have advised patient to quit/stop smoking as soon as possible due to high risk for multiple medical problems  Patient is NOT willing to quit smoking  I have advised patient that we can assist and have options of Nicotine replacement therapy. I also advised patient on behavioral therapy and can provide oral medication therapy in conjunction with the other therapies Follow up next Office visit  for assessment of smoking cessation Smoking cessation counseling advised for 4 minutes        Patient Active Problem List   Diagnosis Date Noted   Acute hypoxemic respiratory failure ([place]) 06/13/2020   Personal history of colonic polyps    Polyp of transverse colon    Varicose veins of bilateral lower extremities with pain 11/22/2016   Restless leg 11/18/2016   Iron deficiency 11/18/2016   Allergic rhinitis 06/11/2016   Hyperlipidemia 03/19/2015   COPD exacerbation ([place]) 10/04/2014   Depression 09/22/2014   Dyshidrosis 09/22/2014   GERD (gastroesophageal reflux disease) 09/22/2014   Insomnia 09/22/2014   Lung  nodule, multiple 09/22/2014   Panic disorder 09/22/2014   Palpitations 01/17/2014   Pulmonary nodule 06/10/2013   Shortness of breath 05/20/2013   COPD, GOLD B 05/20/2013   Tobacco abuse 05/20/2013   Daytime somnolence 05/20/2013   Back pain 08/28/2012   Discoid lupus 05/30/2012   Generalized abdominal pain 03/11/2012   Pain in joint 03/09/2012   Psoriasis 02/21/2012   Fibromyalgia 02/11/2012   Lupus ([place]) 02/11/2012   Panic attacks 02/11/2012   History of adenomatous polyp of colon 09/13/2011   Heartburn 08/06/2011   PVC's (premature ventricular contractions) 01/29/2011   Essential (primary) hypertension 03/04/1998    Allergies  Allergen Reactions   Alum & Mag Hydroxide-Simeth Diarrhea and Nausea Only   Aspirin     Nosebleed    Bactrim [Sulfamethoxazole-Trimethoprim] Itching   Cefdinir Other (See Comments)    tachycardia   Chantix [Varenicline]     Bad dreams   Doxycycline Other (See Comments)    Nausea, migraine   Multaq [Dronedarone] Other (See Comments)    Lip/ mouth numbness/ tongue swelling   Ivp Dye [Iodinated Diagnostic Agents] Palpitations    Current Meds  Medication Sig   albuterol (PROVENTIL) (2.5 MG/3ML) 0.083% nebulizer solution Take 3 mLs (2.5 mg total) by nebulization every 6 (six) hours as needed for wheezing or shortness of breath.   albuterol (PROVENTIL) (2.5 MG/3ML) 0.083% nebulizer solution USE 1 VIAL IN NEBULIZER EVERY 4 (FOUR) HOURS AS NEEDED FOR WHEEZING OR SHORTNESS OF BREATH.   albuterol (VENTOLIN HFA) 108 (90 Base) MCG/ACT inhaler Inhale 2  puffs into the lungs every 6 (six) hours as needed for wheezing or shortness of breath.   clonazePAM (KLONOPIN) 1 MG tablet Take 1 tablet (1 mg total) by mouth 2 (two) times daily as needed.   dexlansoprazole (DEXILANT) 60 MG capsule Take 1 capsule (60 mg total) by mouth daily.   hydroxychloroquine (PLAQUENIL) 200 MG tablet TAKE 2 TABLETS (400 MG TOTAL) BY MOUTH DAILY. FOR DISCOID LUPUS (L93.0)   Misc.  Devices (PULSE OXIMETER DELUXE) MISC Use daily to monitor oxygen levels   Nebulizers (COMPRESSOR/NEBULIZER) MISC Supplies only   Nebulizers (COMPRESSOR/NEBULIZER) MISC Use as directed   nitroGLYCERIN (NITROSTAT) 0.4 MG SL tablet Place 1 tablet (0.4 mg total) under the tongue every 5 (five) minutes as needed for chest pain.   propranolol (INDERAL) 20 MG tablet TAKE 1 TABLET BY MOUTH TWICE A DAY   Respiratory Therapy Supplies (FLUTTER) DEVI 1 Device by Does not apply route daily.   Spacer/Aero-Holding Chambers (AEROCHAMBER MV) inhaler Use as instructed       Review of Systems:  Gen:  Denies  fever, sweats, chills weight loss  HEENT: Denies blurred vision, double vision, ear pain, eye pain, hearing loss, nose bleeds, sore throat Cardiac:  No dizziness, chest pain or heaviness, chest tightness,edema, No JVD Resp:   No cough, -sputum production, +shortness of breath,-wheezing, -hemoptysis,  Other:  All other systems negative  Objective:   Physical Exam BP 122/78 (BP Location: Left Arm, Patient Position: Sitting, Cuff Size: Normal)   Pulse 83   Temp 98.1 F (36.7 C) (Oral)   Ht 5\' 3"  (1.6 m)   Wt 145 lb 12.8 oz (66.1 kg)   SpO2 99%   BMI 25.83 kg/m    Physical Examination:   General Appearance: No distress  EYES PERRLA, EOM intact.   NECK Supple, No JVD Pulmonary: normal breath sounds, No wheezing.  CardiovascularNormal S1,S2.  No m/r/g.   ALL OTHER ROS ARE NEGATIVE   CT scan of the chest performed prior to discharge showed emphysematous changes with no acute infiltrate.         ASSESSMENT AND PLAN  55 year old white female with a history of moderate COPD based on pulmonary function testing in 2017 with FEV1 60% predicted with ongoing tobacco abuse with multiple admissions for COPD exacerbation most recent exacerbation was in April 2022 but has been having ongoing symptoms of wheezing cough chest congestion in the setting of ongoing tobacco abuse   She continues to smoke  tobacco She has missed multiple appointments in the past Patient is noncompliant with her inhaler therapy  Moderate to severe COPD Gold stage D Patient to restart Breo therapy Obtain pulmonary function testing Obtain 6-minute walk test evaluate lung function  Smoking cessation strongly advised  Reestablish care with lung cancer screening program   COVID-19 EDUCATION: The signs and symptoms of COVID-19 were discussed with the patient. Importance of hand Hygiene and Social Distancing   MEDICATION ADJUSTMENTS/LABS AND TESTS ORDERED: Please stop smoking  obtain 6-minute walk test to assess for exertional hypoxia  Continue albuterol as needed  Obtain pulmonary function testing to assess lung function  Re-ordered BREO 200  CURRENT MEDICATIONS REVIEWED AT LENGTH WITH PATIENT TODAY   Patient satisfied with Plan of action and management. All questions answered  Follow up in 6  months   Total time spent 22 mins   Maretta Bees Patricia Pesa, M.D.  [name]  Medical Director [place] Director [facility] Cardio-Pulmonary Department

## 2020-12-07 NOTE — Telephone Encounter (Signed)
Patient is aware that breo was sent to [facility].  She voiced her understanding and had no further questions.  Nothing further needed at this time.

## 2020-12-22 ENCOUNTER — Telehealth: Payer: Self-pay | Admitting: Pulmonary Disease

## 2020-12-25 NOTE — Telephone Encounter (Signed)
Spoke to [place] with [facility] and gave verbal for 160 vials of albuterol solution. Nothing further needed at this time.

## 2020-12-26 ENCOUNTER — Telehealth: Payer: Self-pay | Admitting: Internal Medicine

## 2020-12-26 MED ORDER — AZITHROMYCIN 250 MG PO TABS: ORAL_TABLET | ORAL | 0 refills | Status: AC

## 2020-12-26 MED ORDER — PREDNISONE 20 MG PO TABS: 20.0000 mg | ORAL_TABLET | Freq: Every day | ORAL | 0 refills | Status: DC

## 2020-12-26 NOTE — Telephone Encounter (Signed)
Pt states for the past 2 weeks, after she does the albuterol(proventil)nebulizer treatment, it's harder to breath for about 45 minutes.Pt also on Breo and pt wondering if they are working against each other. Pt states this has happened before and pt went to the er and was given iv steroids. Please advise [phone]

## 2020-12-26 NOTE — Telephone Encounter (Signed)
Patient is aware of recommendations and voiced her understanding.  Prednisone and zpak has been sent to preferred pharmacy. Nothing further needed at this time.

## 2020-12-26 NOTE — Telephone Encounter (Signed)
Spoke to patient.  Patient stated that for the past two weeks, she has develops increased sob after doing albuterol nebulizer treatment. Sob develops within 76min of completing treatment and last for about 59min.  Cough is baseline. Dry cough at times prod with clear sputum.  She is taking Breo once daily. She is questioning if Breo and albuterol are working against each other.   Dr. Mortimer Fries, please advise.

## 2020-12-27 ENCOUNTER — Emergency Department: Payer: Medicare Other

## 2020-12-27 ENCOUNTER — Other Ambulatory Visit: Payer: Self-pay

## 2020-12-27 ENCOUNTER — Emergency Department
Admission: EM | Admit: 2020-12-27 | Discharge: 2020-12-27 | Disposition: A | Discharge status: Home / Self Care | Payer: Medicare Other | Attending: Emergency Medicine | Admitting: Emergency Medicine

## 2020-12-27 DIAGNOSIS — Z20822 Contact with and (suspected) exposure to covid-19: Secondary | ICD-10-CM | POA: Insufficient documentation

## 2020-12-27 DIAGNOSIS — Z7951 Long term (current) use of inhaled steroids: Secondary | ICD-10-CM | POA: Insufficient documentation

## 2020-12-27 DIAGNOSIS — I10 Essential (primary) hypertension: Secondary | ICD-10-CM | POA: Insufficient documentation

## 2020-12-27 DIAGNOSIS — J441 Chronic obstructive pulmonary disease with (acute) exacerbation: Secondary | ICD-10-CM | POA: Insufficient documentation

## 2020-12-27 DIAGNOSIS — F17210 Nicotine dependence, cigarettes, uncomplicated: Secondary | ICD-10-CM | POA: Diagnosis not present

## 2020-12-27 DIAGNOSIS — I2510 Atherosclerotic heart disease of native coronary artery without angina pectoris: Secondary | ICD-10-CM | POA: Insufficient documentation

## 2020-12-27 DIAGNOSIS — R0602 Shortness of breath: Secondary | ICD-10-CM | POA: Diagnosis not present

## 2020-12-27 DIAGNOSIS — Z79899 Other long term (current) drug therapy: Secondary | ICD-10-CM | POA: Insufficient documentation

## 2020-12-27 DIAGNOSIS — J439 Emphysema, unspecified: Secondary | ICD-10-CM | POA: Diagnosis not present

## 2020-12-27 LAB — RESP PANEL BY RT-PCR (FLU A&B, COVID) ARPGX2
Influenza A by PCR: NEGATIVE
Influenza B by PCR: NEGATIVE
SARS Coronavirus 2 by RT PCR: NEGATIVE

## 2020-12-27 LAB — BASIC METABOLIC PANEL
Anion gap: 6 (ref 5–15)
BUN: 12 mg/dL (ref 6–20)
CO2: 30 mmol/L (ref 22–32)
Calcium: 9.8 mg/dL (ref 8.9–10.3)
Chloride: 102 mmol/L (ref 98–111)
Creatinine, Ser: 0.81 mg/dL (ref 0.44–1.00)
GFR, Estimated: >60 mL/min (ref >60)
Glucose, Bld: 82 mg/dL (ref 70–99)
Potassium: 4.3 mmol/L (ref 3.5–5.1)
Sodium: 138 mmol/L (ref 135–145)

## 2020-12-27 LAB — CBC
HCT: 45.1 % (ref 36.0–46.0)
Hemoglobin: 15.4 g/dL — ABNORMAL HIGH (ref 12.0–15.0)
MCH: 28.1 pg (ref 26.0–34.0)
MCHC: 34.1 g/dL (ref 30.0–36.0)
MCV: 82.3 fL (ref 80.0–100.0)
Platelets: 284 10*3/uL (ref 150–400)
RBC: 5.48 MIL/uL — ABNORMAL HIGH (ref 3.87–5.11)
RDW: 14.4 % (ref 11.5–15.5)
WBC: 8.7 10*3/uL (ref 4.0–10.5)
nRBC: 0 % (ref 0.0–0.2)

## 2020-12-27 LAB — TROPONIN I (HIGH SENSITIVITY)
Troponin I (High Sensitivity): <2 ng/L (ref <18)
Troponin I (High Sensitivity): 3 ng/L (ref <18)

## 2020-12-27 MED ORDER — PREDNISONE 20 MG PO TABS
20.0000 mg | ORAL_TABLET | Freq: Every day | ORAL | 0 refills | Status: DC
Start: 2020-12-27 — End: 2021-01-12

## 2020-12-27 MED ORDER — IPRATROPIUM-ALBUTEROL 0.5-2.5 (3) MG/3ML IN SOLN
3.0000 mL | Freq: Once | RESPIRATORY_TRACT | Status: AC
  Administered 2020-12-27: 3 mL via RESPIRATORY_TRACT

## 2020-12-27 MED ORDER — METHYLPREDNISOLONE SODIUM SUCC 125 MG IJ SOLR
125.0000 mg | Freq: Once | INTRAMUSCULAR | Status: AC
  Administered 2020-12-27: 125 mg via INTRAVENOUS
  Filled 2020-12-27: qty 2

## 2020-12-27 MED ORDER — IPRATROPIUM-ALBUTEROL 0.5-2.5 (3) MG/3ML IN SOLN
3.0000 mL | Freq: Once | RESPIRATORY_TRACT | Status: AC
  Administered 2020-12-27: 3 mL via RESPIRATORY_TRACT
  Filled 2020-12-27: qty 3

## 2020-12-27 MED ORDER — PREDNISONE 20 MG PO TABS: 20.0000 mg | ORAL_TABLET | Freq: Every day | ORAL | 0 refills | Status: DC

## 2020-12-27 NOTE — ED Triage Notes (Signed)
Pt comes pov with SOB for about 2 days. States hx of copd. Has been using prescribed meds/inhalers with no relief. Has cough with SOB.

## 2020-12-27 NOTE — ED Provider Notes (Signed)
Emergency Medicine Provider Triage Evaluation Note  Andrea [name], a 55 y.o. female  was evaluated in triage.  Pt complains of 2 days of cough and shortness of breath.  Patient with a history of COPD reports she has been using her inhalers as prescribed but denies any significant relief.  She started on her steroid course by her pulmonologist yesterday.  She denies any interim fevers, chills, or sweats..  Review of Systems  Positive: SOB, cough Negative: CP, FCS  Physical Exam  BP 129/87   Pulse 100   Temp 98.1 F (36.7 C) (Oral)   Resp (!) 22   Ht 5\' 3"  (1.6 m)   Wt 65.8 kg   SpO2 95%   BMI 25.69 kg/m  Gen:   Awake, no distress  NAD Resp:  Normal effort CTA MSK:   Moves extremities without difficulty  Other:  [facility]: [facility]  Medical Decision Making  Medically screening exam initiated at 3:02 PM.  Appropriate orders placed.  Andrea [name] was informed that the remainder of the evaluation will be completed by another provider, this initial triage assessment does not replace that evaluation, and the importance of remaining in the ED until their evaluation is complete.  Patient with ED evaluation of cough and shortness of breath for the last 2 days.     Melvenia Needles, PA-C 12/27/20 1503    Vanessa Selma, MD 12/28/20 [phone]

## 2020-12-27 NOTE — Discharge Instructions (Addendum)
Please take two prednisone pills for the next four days (starting Thursday).  Then take 20mg  daily until you are out.  Follow up with PCP.  Return to the ER if your cough or breathing gets worse.

## 2020-12-27 NOTE — ED Notes (Signed)
Pt walked for assessment of SPO2. Pt maintained 93% on room air. Pt did report mild dizziness at the end. Pt back in bed SPO2 of 94% on room air. NADN. MD aware

## 2020-12-27 NOTE — ED Notes (Signed)
Dr. Quentin Cornwall at bedside assessing pt and updating pt on plan of care.

## 2020-12-27 NOTE — ED Provider Notes (Signed)
[facility] Emergency Department Provider Note    Event Date/Time   First MD Initiated Contact with Patient 12/27/20 1824     (approximate)  I have reviewed the triage vital signs and the nursing notes.   HISTORY  Chief Complaint Shortness of Breath    HPI Andrea [name] is a 55 y.o. female presents to the ER for evaluation of shortness of breath and wheezing over the past 48 hours.  Saw her pulmonologist was put on prednisone and a Z-Pak but states that she typically requires dose of IV steroids for improvement.  She does still smoke.  Denies any chest pain.  No pleuritic discomfort.  Nonproductive cough.  Has been taking her inhalers without much improvement.  No nausea or vomiting.  Past Medical History:  Diagnosis Date   Anxiety    panic attacks, chest pain   Arthritis    COPD (chronic obstructive pulmonary disease) (HCC)    GERD (gastroesophageal reflux disease)    Hypertension    not medicated   Non-obstructive CAD in native artery    a. cardiac cath 01/2014: showed minor irregularities, mildly elevated left ventricular end-diastolic pressure and normal ejection fraction; b. 03/2015 MV: low risk.   Palpitations    a. 08/2016 Event Monitor: occas PVC's, two brief runs of SVT (4 and 7 beats).   PONV (postoperative nausea and vomiting)    Symptomatic PVC's (premature ventricular contractions)    Tobacco abuse    Wears dentures    partial upper and lower   Family History  Problem Relation Age of Onset   Emphysema Father    Asthma Father    Heart disease Father    Heart attack Father    Arthritis Father        RA   Colon cancer Father    Hypertension Mother    Breast cancer Neg Hx    Past Surgical History:  Procedure Laterality Date   CARDIAC CATHETERIZATION  01/03/14   CARDIAC CATHETERIZATION     CHOLECYSTECTOMY     COLONOSCOPY WITH PROPOFOL N/A 10/05/2019   Procedure: COLONOSCOPY WITH PROPOFOL;  Surgeon: Lucilla Lame, MD;  Location:  [facility] ENDOSCOPY;  Service: Endoscopy;  Laterality: N/A;   ESOPHAGOGASTRODUODENOSCOPY (EGD) WITH PROPOFOL N/A 07/11/2016   Procedure: ESOPHAGOGASTRODUODENOSCOPY (EGD) WITH PROPOFOL;  Surgeon: Lucilla Lame, MD;  Location: [place];  Service: Endoscopy;  Laterality: N/A;   ESOPHAGOGASTRODUODENOSCOPY (EGD) WITH PROPOFOL N/A 10/05/2019   Procedure: ESOPHAGOGASTRODUODENOSCOPY (EGD) WITH PROPOFOL;  Surgeon: Lucilla Lame, MD;  Location: [facility] ENDOSCOPY;  Service: Endoscopy;  Laterality: N/A;   EYE SURGERY     IMAGE GUIDED SINUS SURGERY N/A 11/30/2014   Procedure: IMAGE GUIDED SINUS SURGERY;  Surgeon: Carloyn Manner, MD;  Location: [place];  Service: ENT;  Laterality: N/A;  GAVE DISK TO CE CE   MAXILLARY ANTROSTOMY Bilateral 11/30/2014   Procedure: MAXILLARY ANTROSTOMY;  Surgeon: Carloyn Manner, MD;  Location: [place];  Service: ENT;  Laterality: Bilateral;   SEPTOPLASTY N/A 11/30/2014   Procedure: SEPTOPLASTY;  Surgeon: Carloyn Manner, MD;  Location: [place];  Service: ENT;  Laterality: N/A;   SPHENOIDECTOMY Left 11/30/2014   Procedure: Coralee Pesa, ;  Surgeon: Carloyn Manner, MD;  Location: [place];  Service: ENT;  Laterality: Left;   VAGINAL HYSTERECTOMY  1995   vaginal   Patient Active Problem List   Diagnosis Date Noted   Acute hypoxemic respiratory failure ([place]) 06/13/2020   Personal history of colonic polyps  Polyp of transverse colon    Varicose veins of bilateral lower extremities with pain 11/22/2016   Restless leg 11/18/2016   Iron deficiency 11/18/2016   Allergic rhinitis 06/11/2016   Hyperlipidemia 03/19/2015   COPD exacerbation ([place]) 10/04/2014   Depression 09/22/2014   Dyshidrosis 09/22/2014   GERD (gastroesophageal reflux disease) 09/22/2014   Insomnia 09/22/2014   Lung nodule, multiple 09/22/2014   Panic disorder 09/22/2014   Palpitations 01/17/2014   Pulmonary nodule 06/10/2013   Shortness of breath 05/20/2013    COPD, GOLD B 05/20/2013   Tobacco abuse 05/20/2013   Daytime somnolence 05/20/2013   Back pain 08/28/2012   Discoid lupus 05/30/2012   Generalized abdominal pain 03/11/2012   Pain in joint 03/09/2012   Psoriasis 02/21/2012   Fibromyalgia 02/11/2012   Lupus ([place]) 02/11/2012   Panic attacks 02/11/2012   History of adenomatous polyp of colon 09/13/2011   Heartburn 08/06/2011   PVC's (premature ventricular contractions) 01/29/2011   Essential (primary) hypertension 03/04/1998      Prior to Admission medications   Medication Sig Start Date End Date Taking? Authorizing Provider  albuterol (PROVENTIL) (2.5 MG/3ML) 0.083% nebulizer solution Take 3 mLs (2.5 mg total) by nebulization every 6 (six) hours as needed for wheezing or shortness of breath. 06/13/20 06/13/21  Tyler Pita, MD  albuterol (PROVENTIL) (2.5 MG/3ML) 0.083% nebulizer solution USE 1 VIAL IN NEBULIZER EVERY 4 (FOUR) HOURS AS NEEDED FOR WHEEZING OR SHORTNESS OF BREATH. 11/28/20   Flora Lipps, MD  albuterol (VENTOLIN HFA) 108 (90 Base) MCG/ACT inhaler Inhale 2 puffs into the lungs every 6 (six) hours as needed for wheezing or shortness of breath. 11/28/20   Flora Lipps, MD  azithromycin (ZITHROMAX) 250 MG tablet Take 2 tablets (500 mg) on  Day 1,  followed by 1 tablet (250 mg) once daily on Days 2 through 5. 12/26/20 12/31/20  Flora Lipps, MD  benzonatate (TESSALON) 100 MG capsule Take 1 capsule (100 mg total) by mouth 3 (three) times daily as needed. Patient not taking: Reported on 12/07/2020 11/03/20   Mar Daring, PA-C  clonazePAM (KLONOPIN) 1 MG tablet Take 1 tablet (1 mg total) by mouth 2 (two) times daily as needed. 11/14/20   Cletis Athens, MD  dexlansoprazole (DEXILANT) 60 MG capsule Take 1 capsule (60 mg total) by mouth daily. 10/17/20   Lucilla Lame, MD  fluticasone (FLONASE) 50 MCG/ACT nasal spray Place 2 sprays into both nostrils daily. Patient not taking: Reported on 12/07/2020 04/05/20   Birdie Sons, MD   fluticasone furoate-vilanterol (BREO ELLIPTA) 200-25 MCG/INH AEPB Inhale 1 puff into the lungs daily. Patient not taking: Reported on 12/07/2020 12/01/20 12/31/20  Flora Lipps, MD  fluticasone furoate-vilanterol (BREO ELLIPTA) 200-25 MCG/INH AEPB Inhale 1 puff into the lungs 2 (two) times daily. 12/07/20   Flora Lipps, MD  hydroxychloroquine (PLAQUENIL) 200 MG tablet TAKE 2 TABLETS (400 MG TOTAL) BY MOUTH DAILY. FOR DISCOID LUPUS (L93.0) 09/29/19   Birdie Sons, MD  Misc. Devices (PULSE OXIMETER DELUXE) MISC Use daily to monitor oxygen levels 01/07/20   Mar Daring, PA-C  Nebulizers (COMPRESSOR/NEBULIZER) MISC Supplies only 11/28/20   Flora Lipps, MD  Nebulizers (COMPRESSOR/NEBULIZER) MISC Use as directed 12/01/20   Flora Lipps, MD  nitroGLYCERIN (NITROSTAT) 0.4 MG SL tablet Place 1 tablet (0.4 mg total) under the tongue every 5 (five) minutes as needed for chest pain. 12/08/14   Wellington [place], MD  predniSONE (DELTASONE) 20 MG tablet Take 1 tablet (20 mg total) by mouth daily  with breakfast. 12/27/20   Merlyn Lot, MD  propranolol (INDERAL) 20 MG tablet TAKE 1 TABLET BY MOUTH TWICE A DAY 07/17/20   Birdie Sons, MD  Respiratory Therapy Supplies (FLUTTER) DEVI 1 Device by Does not apply route daily. 03/19/15   Theodoro Grist, MD  Spacer/Aero-Holding Josiah Lobo (AEROCHAMBER MV) inhaler Use as instructed 01/13/20   Tyler Pita, MD    Allergies Alum & mag hydroxide-simeth, Aspirin, Bactrim [sulfamethoxazole-trimethoprim], Cefdinir, Chantix [varenicline], Doxycycline, Multaq [dronedarone], and Ivp dye [iodinated diagnostic agents]    Social History Social History   Tobacco Use   Smoking status: Every Day    Packs/day: 1.00    Years: 20.00    Pack years: 20.00    Types: Cigarettes   Smokeless tobacco: Never   Tobacco comments:    5 cigarettes a day 08/23/2020  Vaping Use   Vaping Use: Never used  Substance Use Topics   Alcohol use: No    Alcohol/week: 0.0  standard drinks   Drug use: No    Review of Systems Patient denies headaches, rhinorrhea, blurry vision, numbness, shortness of breath, chest pain, edema, cough, abdominal pain, nausea, vomiting, diarrhea, dysuria, fevers, rashes or hallucinations unless otherwise stated above in HPI. ____________________________________________   PHYSICAL EXAM:  VITAL SIGNS: Vitals:   12/27/20 1840 12/27/20 1946  BP: 136/75 115/67  Pulse: 85 93  Resp: 20 18  Temp:    SpO2: 97% 93%    Constitutional: Alert and oriented.  Eyes: Conjunctivae are normal.  Head: Atraumatic. Nose: No congestion/rhinnorhea. Mouth/Throat: Mucous membranes are moist.   Neck: No stridor. Painless ROM.  Cardiovascular: Normal rate, regular rhythm. Grossly normal heart sounds.  Good peripheral circulation. Respiratory: Normal respiratory effort.  No retractions. Lungs with coarse breathsounds and end expiratory wheeze Gastrointestinal: Soft and nontender. No distention. No abdominal bruits. No CVA tenderness. Genitourinary:  Musculoskeletal: No lower extremity tenderness nor edema.  No joint effusions. Neurologic:  Normal speech and language. No gross focal neurologic deficits are appreciated. No facial droop Skin:  Skin is warm, dry and intact. No rash noted. Psychiatric: Mood and affect are normal. Speech and behavior are normal.  ____________________________________________   LABS (all labs ordered are listed, but only abnormal results are displayed)  Results for orders placed or performed during the hospital encounter of 12/27/20 (from the past 24 hour(s))  Basic metabolic panel     Status: None   Collection Time: 12/27/20  2:53 PM  Result Value Ref Range   Sodium 138 135 - 145 mmol/L   Potassium 4.3 3.5 - 5.1 mmol/L   Chloride 102 98 - 111 mmol/L   CO2 30 22 - 32 mmol/L   Glucose, Bld 82 70 - 99 mg/dL   BUN 12 6 - 20 mg/dL   Creatinine, Ser 0.81 0.44 - 1.00 mg/dL   Calcium 9.8 8.9 - 10.3 mg/dL   GFR,  Estimated >60 >60 mL/min   Anion gap 6 5 - 15  CBC     Status: Abnormal   Collection Time: 12/27/20  2:53 PM  Result Value Ref Range   WBC 8.7 4.0 - 10.5 K/uL   RBC 5.48 (H) 3.87 - 5.11 MIL/uL   Hemoglobin 15.4 (H) 12.0 - 15.0 g/dL   HCT 45.1 36.0 - 46.0 %   MCV 82.3 80.0 - 100.0 fL   MCH 28.1 26.0 - 34.0 pg   MCHC 34.1 30.0 - 36.0 g/dL   RDW 14.4 11.5 - 15.5 %   Platelets 284 150 - 400  K/uL   nRBC 0.0 0.0 - 0.2 %  Troponin I (High Sensitivity)     Status: None   Collection Time: 12/27/20  2:53 PM  Result Value Ref Range   Troponin I (High Sensitivity) <2 <18 ng/L  Troponin I (High Sensitivity)     Status: None   Collection Time: 12/27/20  5:26 PM  Result Value Ref Range   Troponin I (High Sensitivity) 3 <18 ng/L  Resp Panel by RT-PCR (Flu A&B, Covid) Nasopharyngeal Swab     Status: None   Collection Time: 12/27/20  6:48 PM   Specimen: Nasopharyngeal Swab; Nasopharyngeal(NP) swabs in vial transport medium  Result Value Ref Range   SARS Coronavirus 2 by RT PCR NEGATIVE NEGATIVE   Influenza A by PCR NEGATIVE NEGATIVE   Influenza B by PCR NEGATIVE NEGATIVE   ____________________________________________  EKG My review and personal interpretation at Time: 14:48   Indication: sob  Rate: 90  Rhythm: sinus Axis: normal Other: normal intervals, no stemi ____________________________________________  RADIOLOGY  I personally reviewed all radiographic images ordered to evaluate for the above acute complaints and reviewed radiology reports and findings.  These findings were personally discussed with the patient.  Please see medical record for radiology report.  ____________________________________________   PROCEDURES  Procedure(s) performed:  Procedures    Critical Care performed: no ____________________________________________   INITIAL IMPRESSION / ASSESSMENT AND PLAN / ED COURSE  Pertinent labs & imaging results that were available during my care of the patient were  reviewed by me and considered in my medical decision making (see chart for details).   DDX: Asthma, copd, CHF, pna, ptx, malignancy, Pe, anemia  Andrea [name] is a 55 y.o. who presents to the ED with presentation as described above.  Patient nontoxic-appearing no respiratory distress.  Mildly tachypneic with lung sounds and exam concerning for COPD.  Will give Solu-Medrol as well as nebulizers and reassess.  Have lower suspicion for PE or ACS.  Does not appear to have evidence of pneumonia not consistent with CHF or ACS.  Clinical Course as of 12/27/20 2005  Wed Dec 27, 2020  2004 Patient feeling much improved.  Able to ambulate with steady gait no hypoxia no tachypnea.  Given reassuring work-up with her known history of COPD in setting of smoking" she stable and appropriate for outpatient follow-up.  She is currently only on 20 mg of prednisone and states she typically gets a higher dose and then taper.  We will give slight increase to her prednisone.  Also discussed smoking cessation.  Patient feels comfortable with plan for discharge home and outpatient follow-up. [PR]    Clinical Course User Index [PR] Merlyn Lot, MD    The patient was evaluated in Emergency Department today for the symptoms described in the history of present illness. He/she was evaluated in the context of the global COVID-19 pandemic, which necessitated consideration that the patient might be at risk for infection with the SARS-CoV-2 virus that causes COVID-19. Institutional protocols and algorithms that pertain to the evaluation of patients at risk for COVID-19 are in a state of rapid change based on information released by regulatory bodies including the CDC and federal and state organizations. These policies and algorithms were followed during the patient's care in the ED.  As part of my medical decision making, I reviewed the following data within the [place] notes reviewed and  incorporated, Labs reviewed, notes from prior ED visits and [place] Controlled Substance Database  ____________________________________________   FINAL CLINICAL IMPRESSION(S) / ED DIAGNOSES  Final diagnoses:  COPD exacerbation ([place])      NEW MEDICATIONS STARTED DURING THIS VISIT:  Current Discharge Medication List       Note:  This document was prepared using Dragon voice recognition software and may include unintentional dictation errors.    Merlyn Lot, MD 12/27/20 2005

## 2020-12-28 ENCOUNTER — Telehealth: Payer: Self-pay | Admitting: Pulmonary Disease

## 2020-12-28 DIAGNOSIS — J449 Chronic obstructive pulmonary disease, unspecified: Secondary | ICD-10-CM

## 2020-12-28 NOTE — Telephone Encounter (Signed)
Oxygen levels have not been demonstrated to be low to require supplemental oxygen.  She should quit smoking as smoking increases carbon monoxide in the blood and that decreases the amount of oxygen available to the blood.  Recommend repeating PFTs and keeping her appointment as scheduled.  She also needs to use her inhalers regularly.

## 2020-12-28 NOTE — Addendum Note (Signed)
Addended by: Carlisle Cater on: 12/28/2020 01:09 PM   Modules accepted: Orders

## 2020-12-28 NOTE — Telephone Encounter (Signed)
Spoke to patient, who stated that she was seen at that ED yesterday due to low oxygen levels.  According to ED notes, spo2 was 95% upon arrival. Per patient lowest spo2 was 91%. Patient stated that she was given a nebulizer treatment and depo.  She is questioning if there is sometime that can be given during COPD flare to prevent low oxygen levels.  Normal ONO 07/22.  Rodena Piety, please schedule PFT prior to 01/30/2021 visit.  Dr. Patsey Berthold, please advise. Thanks

## 2020-12-28 NOTE — Telephone Encounter (Signed)
Created in error

## 2020-12-28 NOTE — Telephone Encounter (Signed)
Called and spoke to patient about Dr Domingo Dimes recommendation. Patient is in agreement to repeat PFT's. Will place order. Nothing further needed.

## 2021-01-05 ENCOUNTER — Other Ambulatory Visit: Payer: Medicare Other

## 2021-01-10 NOTE — Patient Instructions (Signed)
.   Please review the attached list of medications and notify my office if there are any errors.   . Please bring all of your medications to every appointment so we can make sure that our medication list is the same as yours.   

## 2021-01-12 ENCOUNTER — Ambulatory Visit (INDEPENDENT_AMBULATORY_CARE_PROVIDER_SITE_OTHER): Payer: Medicare Other | Admitting: Family Medicine

## 2021-01-12 ENCOUNTER — Other Ambulatory Visit: Payer: Self-pay

## 2021-01-12 ENCOUNTER — Encounter: Payer: Self-pay | Admitting: Family Medicine

## 2021-01-12 ENCOUNTER — Other Ambulatory Visit
Admission: RE | Admit: 2021-01-12 | Discharge: 2021-01-12 | Disposition: A | Discharge status: Home / Self Care | Payer: Medicare Other | Source: Ambulatory Visit | Attending: Internal Medicine | Admitting: Internal Medicine

## 2021-01-12 VITALS — BP 125/85 | HR 93 | Temp 98.3°F | Resp 18 | Wt 146.3 lb

## 2021-01-12 DIAGNOSIS — Z20822 Contact with and (suspected) exposure to covid-19: Secondary | ICD-10-CM | POA: Diagnosis not present

## 2021-01-12 DIAGNOSIS — I700 Atherosclerosis of aorta: Secondary | ICD-10-CM

## 2021-01-12 DIAGNOSIS — Z01812 Encounter for preprocedural laboratory examination: Secondary | ICD-10-CM | POA: Insufficient documentation

## 2021-01-12 DIAGNOSIS — J449 Chronic obstructive pulmonary disease, unspecified: Secondary | ICD-10-CM

## 2021-01-12 LAB — SARS CORONAVIRUS 2 (TAT 6-24 HRS): SARS Coronavirus 2: NEGATIVE

## 2021-01-12 MED ORDER — PREDNISONE 20 MG PO TABS: 20.0000 mg | ORAL_TABLET | Freq: Every day | ORAL | 0 refills | Status: AC
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
## 2021-01-15 ENCOUNTER — Ambulatory Visit: Payer: Medicare Other | Attending: Internal Medicine

## 2021-01-15 DIAGNOSIS — F17210 Nicotine dependence, cigarettes, uncomplicated: Secondary | ICD-10-CM | POA: Diagnosis not present

## 2021-01-15 DIAGNOSIS — R059 Cough, unspecified: Secondary | ICD-10-CM | POA: Insufficient documentation

## 2021-01-15 DIAGNOSIS — J449 Chronic obstructive pulmonary disease, unspecified: Secondary | ICD-10-CM | POA: Diagnosis not present

## 2021-01-15 DIAGNOSIS — R0600 Dyspnea, unspecified: Secondary | ICD-10-CM | POA: Insufficient documentation

## 2021-01-17 ENCOUNTER — Encounter: Payer: Medicare Other | Admitting: Family Medicine

## 2021-01-19 ENCOUNTER — Telehealth: Payer: Medicare Other | Admitting: Physician Assistant

## 2021-01-19 DIAGNOSIS — J441 Chronic obstructive pulmonary disease with (acute) exacerbation: Secondary | ICD-10-CM

## 2021-01-19 MED ORDER — PREDNISONE 10 MG (21) PO TBPK: ORAL_TABLET | ORAL | 0 refills | Status: DC

## 2021-01-19 MED ORDER — BENZONATATE 100 MG PO CAPS
100.0000 mg | ORAL_CAPSULE | Freq: Three times a day (TID) | ORAL | 0 refills | Status: DC | PRN
Start: 2021-01-19 — End: 2021-01-30

## 2021-01-19 MED ORDER — AZITHROMYCIN 250 MG PO TABS: ORAL_TABLET | ORAL | 0 refills | Status: DC

## 2021-01-19 NOTE — Addendum Note (Signed)
Addended by: Mar Daring on: 01/19/2021 09:55 AM   Modules accepted: Orders

## 2021-01-19 NOTE — Progress Notes (Signed)
We are sorry that you are not feeling well.  Here is how we plan to help!  Based on your presentation I believe you most likely have A cough due to bacteria.  When patients have a fever and a productive cough with a change in color or increased sputum production, we are concerned about bacterial bronchitis.  If left untreated it can progress to pneumonia.  If your symptoms do not improve with your treatment plan it is important that you contact your provider.   I have prescribed Azithromyin 250 mg: two tablets now and then one tablet daily for 4 additonal days    In addition you may use A prescription cough medication called Tessalon Perles 100mg. You may take 1-2 capsules every 8 hours as needed for your cough.  Prednisone 10 mg daily for 6 days (see taper instructions below)  Directions for 6 day taper: Day 1: 2 tablets before breakfast, 1 after both lunch & dinner and 2 at bedtime Day 2: 1 tab before breakfast, 1 after both lunch & dinner and 2 at bedtime Day 3: 1 tab at each meal & 1 at bedtime Day 4: 1 tab at breakfast, 1 at lunch, 1 at bedtime Day 5: 1 tab at breakfast & 1 tab at bedtime Day 6: 1 tab at breakfast  From your responses in the eVisit questionnaire you describe inflammation in the upper respiratory tract which is causing a significant cough.  This is commonly called Bronchitis and has four common causes:   Allergies Viral Infections Acid Reflux Bacterial Infection Allergies, viruses and acid reflux are treated by controlling symptoms or eliminating the cause. An example might be a cough caused by taking certain blood pressure medications. You stop the cough by changing the medication. Another example might be a cough caused by acid reflux. Controlling the reflux helps control the cough.  USE OF BRONCHODILATOR ("RESCUE") INHALERS: There is a risk from using your bronchodilator too frequently.  The risk is that over-reliance on a medication which only relaxes the muscles  surrounding the breathing tubes can reduce the effectiveness of medications prescribed to reduce swelling and congestion of the tubes themselves.  Although you feel brief relief from the bronchodilator inhaler, your asthma may actually be worsening with the tubes becoming more swollen and filled with mucus.  This can delay other crucial treatments, such as oral steroid medications. If you need to use a bronchodilator inhaler daily, several times per day, you should discuss this with your provider.  There are probably better treatments that could be used to keep your asthma under control.     HOME CARE Only take medications as instructed by your medical team. Complete the entire course of an antibiotic. Drink plenty of fluids and get plenty of rest. Avoid close contacts especially the very young and the elderly Cover your mouth if you cough or cough into your sleeve. Always remember to wash your hands A steam or ultrasonic humidifier can help congestion.   GET HELP RIGHT AWAY IF: You develop worsening fever. You become short of breath You cough up blood. Your symptoms persist after you have completed your treatment plan MAKE SURE YOU  Understand these instructions. Will watch your condition. Will get help right away if you are not doing well or get worse.    Thank you for choosing an e-visit.  Your e-visit answers were reviewed by a board certified advanced clinical practitioner to complete your personal care plan. Depending upon the condition, your plan could   have included both over the counter or prescription medications.  Please review your pharmacy choice. Make sure the pharmacy is open so you can pick up prescription now. If there is a problem, you may contact your provider through MyChart messaging and have the prescription routed to another pharmacy.  Your safety is important to us. If you have drug allergies check your prescription carefully.   For the next 24 hours you can use  MyChart to ask questions about today's visit, request a non-urgent call back, or ask for a work or school excuse. You will get an email in the next two days asking about your experience. I hope that your e-visit has been valuable and will speed your recovery.  I provided 5 minutes of non face-to-face time during this encounter for chart review and documentation.   

## 2021-01-22 ENCOUNTER — Telehealth: Payer: Self-pay | Admitting: Internal Medicine

## 2021-01-22 NOTE — Telephone Encounter (Signed)
Spoke to patient, who stated that [facility] needs an order for nebulizer machine.  Per patient, Rx was faxed to our office. I have no received Rx. Patient stated that she would contact [facility] and request that they re fax Rx. Verified fax number with patient.

## 2021-01-23 ENCOUNTER — Ambulatory Visit: Payer: Medicare Other

## 2021-01-23 NOTE — Telephone Encounter (Signed)
Order has not been received. According to our records, order for nebulizer was sent to [facility] on 12/01/2020  ATC to contact [facility] and was placed on hold for >34min.  Will call back

## 2021-01-24 NOTE — Telephone Encounter (Signed)
Order has been received and placed in Dr. Zoila Shutter folder for signature.

## 2021-01-26 NOTE — Progress Notes (Signed)
Appt cancelled, no visit.

## 2021-01-26 NOTE — Telephone Encounter (Signed)
Dr. Mortimer Fries is out of office until 01/29/2021

## 2021-01-29 NOTE — Telephone Encounter (Signed)
Rx signed and faxed to [facility].  Will close encounter.

## 2021-01-29 NOTE — Telephone Encounter (Signed)
Dr. Kasa, please advise.    

## 2021-01-30 ENCOUNTER — Other Ambulatory Visit
Admission: RE | Admit: 2021-01-30 | Discharge: 2021-01-30 | Disposition: A | Discharge status: Home / Self Care | Payer: Medicare Other | Attending: Pulmonary Disease | Admitting: Pulmonary Disease

## 2021-01-30 ENCOUNTER — Ambulatory Visit (INDEPENDENT_AMBULATORY_CARE_PROVIDER_SITE_OTHER): Payer: Medicare Other | Admitting: Pulmonary Disease

## 2021-01-30 ENCOUNTER — Other Ambulatory Visit: Payer: Self-pay

## 2021-01-30 ENCOUNTER — Encounter: Payer: Self-pay | Admitting: Pulmonary Disease

## 2021-01-30 VITALS — BP 130/80 | HR 80 | Temp 97.7°F | Ht 64.0 in | Wt 146.6 lb

## 2021-01-30 DIAGNOSIS — F17210 Nicotine dependence, cigarettes, uncomplicated: Secondary | ICD-10-CM | POA: Diagnosis not present

## 2021-01-30 DIAGNOSIS — I700 Atherosclerosis of aorta: Secondary | ICD-10-CM | POA: Diagnosis not present

## 2021-01-30 DIAGNOSIS — J449 Chronic obstructive pulmonary disease, unspecified: Secondary | ICD-10-CM | POA: Diagnosis not present

## 2021-01-30 DIAGNOSIS — R0602 Shortness of breath: Secondary | ICD-10-CM

## 2021-01-30 LAB — CBC WITH DIFFERENTIAL/PLATELET
Abs Immature Granulocytes: 0.02 10*3/uL (ref 0.00–0.07)
Basophils Absolute: 0.1 10*3/uL (ref 0.0–0.1)
Basophils Relative: 1 %
Eosinophils Absolute: 0.2 10*3/uL (ref 0.0–0.5)
Eosinophils Relative: 3 %
HCT: 44.4 % (ref 36.0–46.0)
Hemoglobin: 14.7 g/dL (ref 12.0–15.0)
Immature Granulocytes: 0 %
Lymphocytes Relative: 32 %
Lymphs Abs: 2.3 10*3/uL (ref 0.7–4.0)
MCH: 27.3 pg (ref 26.0–34.0)
MCHC: 33.1 g/dL (ref 30.0–36.0)
MCV: 82.5 fL (ref 80.0–100.0)
Monocytes Absolute: 0.5 10*3/uL (ref 0.1–1.0)
Monocytes Relative: 6 %
Neutro Abs: 4.2 10*3/uL (ref 1.7–7.7)
Neutrophils Relative %: 58 %
Platelets: 257 10*3/uL (ref 150–400)
RBC: 5.38 MIL/uL — ABNORMAL HIGH (ref 3.87–5.11)
RDW: 14.4 % (ref 11.5–15.5)
WBC: 7.3 10*3/uL (ref 4.0–10.5)
nRBC: 0 % (ref 0.0–0.2)

## 2021-01-30 MED ORDER — BREZTRI AEROSPHERE 160-9-4.8 MCG/ACT IN AERO: 2.0000 | INHALATION_SPRAY | Freq: Two times a day (BID) | RESPIRATORY_TRACT | 0 refills | Status: DC

## 2021-01-30 NOTE — Progress Notes (Signed)
Subjective:    Patient ID: Andrea [name], female    DOB: 1965/04/15, 55 y.o.   MRN: [id number] Chief Complaint  Patient presents with   Follow-up    COPD-   HPI [name] is a 55 year old current smoker (0.5 PPD) who presents for follow-up on the issue of COPD and dyspnea.  Most recently she had been seeing Dr. Patricia [name].  I last saw her in November 2021.  She has had multiple admissions and visits to the emergency room due to chronic obstructive pulmonary disease exacerbations.  Most recent visit was on 27 December 2020 and she received a prednisone taper pack at that time.  Last admission to the hospital was in April 2022 for an exacerbation.  She is only maintained on Breo Ellipta, currently no LAMA though previously had been on Spiriva.  She had been trying to quit smoking but has experienced increasing stress in the family and increased her use of tobacco again.  She is up to half a pack of cigarettes a day.  She notices that she has to use albuterol multiple times during the day and albuterol nebulizer at least 1 hour before bedtime.  She notices wheezing in the mornings and in the evenings.  She also notes seasonal variation with wheezing and albuterol use becoming more pronounced with change of season.  Today she does not have any new significant complaint.  She was last seen in the ED as noted on 26 October and has not had another episode since then.  We reviewed PFTs that were performed on 14 November with the patient.  She has had some mild decrease in lung function but overall remains at GOLD class II-III.  She is enrolled in lung cancer screening.   DATA 06/18/2016 PFTs: FEV1 1.45 L or 60% predicted VC 2.38 L or 90% predicted FEV1/FVC 61%.  Lung volumes: TLC 102%, RV 140%. DLCO 57%.  No significant bronchodilator response. 09/25/2016 2D echo: LVEF 60 to 65%, normal study. 09/01/2020 overnight oximetry: No significant desaturations. 01/15/2021 PFTs: FEV1 1.46 L or 57% predicted, FVC  2.31 L or 70% predicted, FEV1/FVC 63%, no bronchodilator response.  TLC 113, RV 200% diffusion capacity 57%, correction by Kco 67%.   Review of Systems A 10 point review of systems was performed and it is as noted above otherwise negative.  Patient Active Problem List   Diagnosis Date Noted   Acute hypoxemic respiratory failure ([place]) 06/13/2020   Personal history of colonic polyps    Polyp of transverse colon    Varicose veins of bilateral lower extremities with pain 11/22/2016   Restless leg 11/18/2016   Iron deficiency 11/18/2016   Allergic rhinitis 06/11/2016   Hyperlipidemia 03/19/2015   COPD exacerbation ([place]) 10/04/2014   Depression 09/22/2014   Dyshidrosis 09/22/2014   GERD (gastroesophageal reflux disease) 09/22/2014   Insomnia 09/22/2014   Lung nodule, multiple 09/22/2014   Panic disorder 09/22/2014   Palpitations 01/17/2014   Pulmonary nodule 06/10/2013   Shortness of breath 05/20/2013   COPD, GOLD B 05/20/2013   Tobacco abuse 05/20/2013   Daytime somnolence 05/20/2013   Back pain 08/28/2012   Discoid lupus 05/30/2012   Generalized abdominal pain 03/11/2012   Pain in joint 03/09/2012   Psoriasis 02/21/2012   Fibromyalgia 02/11/2012   Lupus ([place]) 02/11/2012   Panic attacks 02/11/2012   History of adenomatous polyp of colon 09/13/2011   Heartburn 08/06/2011   PVC's (premature ventricular contractions) 01/29/2011   Essential (primary) hypertension 03/04/1998   Social History  Tobacco Use   Smoking status: Every Day    Packs/day: 1.00    Years: 20.00    Pack years: 20.00    Types: Cigarettes   Smokeless tobacco: Never   Tobacco comments:    0.5 ppd - 01/30/21  Substance Use Topics   Alcohol use: No    Alcohol/week: 0.0 standard drinks   Allergies  Allergen Reactions   Alum & Mag Hydroxide-Simeth Diarrhea and Nausea Only   Aspirin     Nosebleed    Bactrim [Sulfamethoxazole-Trimethoprim] Itching   Cefdinir Other (See Comments)    tachycardia    Chantix [Varenicline]     Bad dreams   Doxycycline Other (See Comments)    Nausea, migraine   Multaq [Dronedarone] Other (See Comments)    Lip/ mouth numbness/ tongue swelling   Ivp Dye [Iodinated Diagnostic Agents] Palpitations   Current Meds  Medication Sig   albuterol (PROVENTIL) (2.5 MG/3ML) 0.083% nebulizer solution Take 3 mLs (2.5 mg total) by nebulization every 6 (six) hours as needed for wheezing or shortness of breath.   albuterol (PROVENTIL) (2.5 MG/3ML) 0.083% nebulizer solution USE 1 VIAL IN NEBULIZER EVERY 4 (FOUR) HOURS AS NEEDED FOR WHEEZING OR SHORTNESS OF BREATH.   albuterol (VENTOLIN HFA) 108 (90 Base) MCG/ACT inhaler Inhale 2 puffs into the lungs every 6 (six) hours as needed for wheezing or shortness of breath.   clonazePAM (KLONOPIN) 1 MG tablet Take 1 tablet (1 mg total) by mouth 2 (two) times daily as needed.   dexlansoprazole (DEXILANT) 60 MG capsule Take 1 capsule (60 mg total) by mouth daily.   fluticasone (FLONASE) 50 MCG/ACT nasal spray Place 2 sprays into both nostrils daily.   fluticasone furoate-vilanterol (BREO ELLIPTA) 200-25 MCG/INH AEPB Inhale 1 puff into the lungs 2 (two) times daily.   hydroxychloroquine (PLAQUENIL) 200 MG tablet TAKE 2 TABLETS (400 MG TOTAL) BY MOUTH DAILY. FOR DISCOID LUPUS (L93.0)   Misc. Devices (PULSE OXIMETER DELUXE) MISC Use daily to monitor oxygen levels   Nebulizers (COMPRESSOR/NEBULIZER) MISC Supplies only   Nebulizers (COMPRESSOR/NEBULIZER) MISC Use as directed   nitroGLYCERIN (NITROSTAT) 0.4 MG SL tablet Place 1 tablet (0.4 mg total) under the tongue every 5 (five) minutes as needed for chest pain.   predniSONE (STERAPRED UNI-PAK 21 TAB) 10 MG (21) TBPK tablet 6 day taper; take as directed on package instructions   propranolol (INDERAL) 20 MG tablet TAKE 1 TABLET BY MOUTH TWICE A DAY   Respiratory Therapy Supplies (FLUTTER) DEVI 1 Device by Does not apply route daily.   Spacer/Aero-Holding Chambers (AEROCHAMBER MV) inhaler Use  as instructed   [DISCONTINUED] azithromycin (ZITHROMAX) 250 MG tablet Take 2 tablets PO on day one, and one tablet PO daily thereafter until completed.   [DISCONTINUED] benzonatate (TESSALON) 100 MG capsule Take 1 capsule (100 mg total) by mouth 3 (three) times daily as needed.   Immunization History  Administered Date(s) Administered   Moderna Sars-Covid-2 Vaccination 11/28/2019, 12/26/2019   Pneumococcal Conjugate-13 11/20/2012   Pneumococcal Polysaccharide-23 08/10/2012      Objective:   Physical Exam BP 130/80 (BP Location: Left Arm, Patient Position: Sitting, Cuff Size: Normal)   Pulse 80   Temp 97.7 F (36.5 C) (Oral)   Ht 5\' 4"  (1.626 m)   Wt 146 lb 9.6 oz (66.5 kg)   SpO2 98%   BMI 25.16 kg/m  GENERAL: Awake, alert, fully ambulatory, somewhat disheveled.  In no respiratory distress.  No conversational dyspnea.  HEAD: Normocephalic, atraumatic.  EYES: Pupils equal, round, reactive  to light.  No scleral icterus.  MOUTH: Nose/mouth/throat not examined due to masking requirements for COVID 19.   NECK: Supple. No thyromegaly. Trachea midline. No JVD.  No adenopathy. PULMONARY: Good air entry bilaterally.  There are coarse breath sounds throughout, no wheezes. CARDIOVASCULAR: S1 and S2. Regular rate and rhythm.  No rubs, murmurs or gallops heard. ABDOMEN: Benign. MUSCULOSKELETAL: No joint deformity, no clubbing, no edema.  NEUROLOGIC: No overt focal deficit.  Speech is fluent.  No gait disturbance. SKIN: Intact,warm,dry. PSYCH: Mood is anxious, behavior normal.     Assessment & Plan:     ICD-10-CM   1. Asthma-COPD overlap syndrome (HCC)  J44.9 Allergen Panel (27) + IGE    CBC w/Diff   She has been noticing seasonal changes Increased wheezing History of asthma as a child Check CBC with differential allergen profile  Breztri 2 puffs BID    2. Shortness of breath  R06.02    Related to COPD GOLD class II-III Quit smoking     3. Tobacco dependence due to cigarettes   F17.210    Patient counseled regards to discontinuation of smoking She is enrolled in lung cancer screening     Orders Placed This Encounter  Procedures   Allergen Panel (27) + IGE    Standing Status:   Future    Number of Occurrences:   1    Standing Expiration Date:   01/30/2022   CBC w/Diff    Standing Status:   Future    Number of Occurrences:   1    Standing Expiration Date:   01/30/2022   Meds ordered this encounter  Medications   Budeson-Glycopyrrol-Formoterol (BREZTRI AEROSPHERE) 160-9-4.8 MCG/ACT AERO    Sig: Inhale 2 puffs into the lungs in the morning and at bedtime.    Dispense:  5.9 g    Refill:  0    Order Specific Question:   Lot Number?    Answer:   [id number]    Order Specific Question:   Expiration Date?    Answer:   09/01/2023    Order Specific Question:   NDC    Answer:   [id number] [[id number]]    Order Specific Question:   Quantity    Answer:   1   Patient has been only on Breo Ellipta with no LAMA.  She has significant air trapping noted on PFTs.  LAMAs are far superior at decreasing air trapping.  Will discontinue Breo and give the patient a trial of Breztri 2 puffs twice a day.  The patient was provided with a spacer for efficacious delivery of the MDI mist.  Patient is to let us know if Judithann Sauger is effective.  She has been noticing more of an allergic component/seasonal variation with her symptoms, will obtain CBC with differential and allergen panel.  We will see the patient in follow-up in 6 to 8 weeks time she is to contact us at that time should any new difficulties arise.   Renold Don, MD Advanced [facility]ch[facility]    *This note was dictated using voice recognition software/Dragon.  Despite best efforts to proofread, errors can occur which can change the meaning.  Any change was purely unintentional.

## 2021-01-30 NOTE — Patient Instructions (Signed)
Continue efforts to quit smoking.  We are giving you a trial of Breztri 2 puffs twice a day, make sure you rinse your mouth well after you use it.  We are giving you a spacer device to use with the inhaler.  Let us know how you do with the inhaler and we will call into your pharmacy for you.  Dr. Mortimer Fries signed the order for the nebulizer yesterday.  We are checking allergen panel and CBC with differential.  DO NOT USE BREO WHILE USING THE BREZTRI.  We will see you in follow-up in 6 to 8 weeks time call sooner should any new problems arise you may see me or Dr. Mortimer Fries at that time.

## 2021-01-31 LAB — LIPID PANEL
Chol/HDL Ratio: 5.3 ratio — ABNORMAL HIGH (ref 0.0–4.4)
Cholesterol, Total: 205 mg/dL — ABNORMAL HIGH (ref 100–199)
HDL: 39 mg/dL — ABNORMAL LOW (ref >39)
LDL Chol Calc (NIH): 148 mg/dL — ABNORMAL HIGH (ref 0–99)
Triglycerides: 98 mg/dL (ref 0–149)
VLDL Cholesterol Cal: 18 mg/dL (ref 5–40)

## 2021-02-01 LAB — ALLERGEN PANEL (27) + IGE
Alternaria Alternata IgE: <0.1 kU/L
Aspergillus Fumigatus IgE: <0.1 kU/L
Bahia Grass IgE: <0.1 kU/L
Bermuda Grass IgE: <0.1 kU/L
Cat Dander IgE: <0.1 kU/L
Cedar, Mountain IgE: <0.1 kU/L
Cladosporium Herbarum IgE: <0.1 kU/L
Cocklebur IgE: <0.1 kU/L
Cockroach, American IgE: <0.1 kU/L
Common Silver Birch IgE: <0.1 kU/L
D Farinae IgE: <0.1 kU/L
D Pteronyssinus IgE: <0.1 kU/L
Dog Dander IgE: <0.1 kU/L
Elm, American IgE: <0.1 kU/L
Hickory, White IgE: <0.1 kU/L
IgE (Immunoglobulin E), Serum: 31 IU/mL (ref 6–495)
Johnson Grass IgE: <0.1 kU/L
Kentucky Bluegrass IgE: <0.1 kU/L
Maple/Box Elder IgE: <0.1 kU/L
Mucor Racemosus IgE: <0.1 kU/L
Oak, White IgE: <0.1 kU/L
Penicillium Chrysogen IgE: <0.1 kU/L
Pigweed, Rough IgE: <0.1 kU/L
Plantain, English IgE: <0.1 kU/L
Ragweed, Short IgE: <0.1 kU/L
Setomelanomma Rostrat: <0.1 kU/L
Timothy Grass IgE: <0.1 kU/L
White Mulberry IgE: <0.1 kU/L

## 2021-02-03 ENCOUNTER — Other Ambulatory Visit: Payer: Self-pay | Admitting: Family Medicine

## 2021-02-03 DIAGNOSIS — J441 Chronic obstructive pulmonary disease with (acute) exacerbation: Secondary | ICD-10-CM

## 2021-02-04 NOTE — Telephone Encounter (Signed)
Prescribed by Dr Flora Lipps at [facility]

## 2021-02-06 ENCOUNTER — Telehealth: Payer: Self-pay

## 2021-02-06 NOTE — Telephone Encounter (Signed)
Copied from [place] [id number]. Topic: General - Other >> Feb 06, 2021  3:38 PM Fields, Museum/gallery conservator R wrote: Reason for CRM: Patient calling in to speak with a nurse in regards to her most recent lab results. [phone]

## 2021-02-06 NOTE — Telephone Encounter (Signed)
Called pt informed her that her cholesterol still is elevated (viewed pts chart). Told pt that the doctor has not viewed her labs yet to let her know the next steps that we will give her a call when he does. Pt verbalized understanding.  KP

## 2021-02-09 ENCOUNTER — Telehealth: Payer: Self-pay | Admitting: Pulmonary Disease

## 2021-02-09 ENCOUNTER — Other Ambulatory Visit: Payer: Self-pay

## 2021-02-09 DIAGNOSIS — E7800 Pure hypercholesterolemia, unspecified: Secondary | ICD-10-CM

## 2021-02-09 MED ORDER — PRAVASTATIN SODIUM 20 MG PO TABS: 20.0000 mg | ORAL_TABLET | Freq: Every day | ORAL | 3 refills | Status: DC

## 2021-02-09 NOTE — Telephone Encounter (Signed)
Pt called to see if Dr. Caryn Section reviewed labs and had anything to advised or any next steps/ please advise

## 2021-02-09 NOTE — Telephone Encounter (Signed)
Lab results from 01/30/2021 in chart awaiting review.

## 2021-02-09 NOTE — Telephone Encounter (Signed)
Pt aware of test results, nothing further needed.

## 2021-02-11 ENCOUNTER — Telehealth: Payer: Medicare Other | Admitting: Nurse Practitioner

## 2021-02-11 DIAGNOSIS — R059 Cough, unspecified: Secondary | ICD-10-CM

## 2021-02-11 MED ORDER — PREDNISONE 10 MG PO TABS: 10.0000 mg | ORAL_TABLET | Freq: Every day | ORAL | 0 refills | Status: DC

## 2021-02-11 MED ORDER — BENZONATATE 100 MG PO CAPS: 100.0000 mg | ORAL_CAPSULE | Freq: Three times a day (TID) | ORAL | 0 refills | Status: AC | PRN

## 2021-02-11 NOTE — Progress Notes (Signed)
We are sorry that you are not feeling well.  Here is how we plan to help!  Based on your presentation I believe you most likely have A cough due to a virus.  This is called viral bronchitis and is best treated by rest, plenty of fluids and control of the cough.  You may use Ibuprofen or Tylenol as directed to help your symptoms.     In addition you may use A prescription cough medication called Tessalon Perles 100mg . You may take 1-2 capsules every 8 hours as needed for your cough.  Prednisone 10 mg daily for 6 days (see taper instructions below)  Directions for 6 day taper: Day 1: 2 tablets before breakfast, 1 after both lunch & dinner and 2 at bedtime Day 2: 1 tab before breakfast, 1 after both lunch & dinner and 2 at bedtime Day 3: 1 tab at each meal & 1 at bedtime Day 4: 1 tab at breakfast, 1 at lunch, 1 at bedtime Day 5: 1 tab at breakfast & 1 tab at bedtime Day 6: 1 tab at breakfast   Please follow-up with your primary care physician regarding your symptoms.  From your responses in the eVisit questionnaire you describe inflammation in the upper respiratory tract which is causing a significant cough.  This is commonly called Bronchitis and has four common causes:   Allergies Viral Infections Acid Reflux Bacterial Infection Allergies, viruses and acid reflux are treated by controlling symptoms or eliminating the cause. An example might be a cough caused by taking certain blood pressure medications. You stop the cough by changing the medication. Another example might be a cough caused by acid reflux. Controlling the reflux helps control the cough.  USE OF BRONCHODILATOR ("RESCUE") INHALERS: There is a risk from using your bronchodilator too frequently.  The risk is that over-reliance on a medication which only relaxes the muscles surrounding the breathing tubes can reduce the effectiveness of medications prescribed to reduce swelling and congestion of the tubes themselves.  Although you  feel brief relief from the bronchodilator inhaler, your asthma may actually be worsening with the tubes becoming more swollen and filled with mucus.  This can delay other crucial treatments, such as oral steroid medications. If you need to use a bronchodilator inhaler daily, several times per day, you should discuss this with your provider.  There are probably better treatments that could be used to keep your asthma under control.     HOME CARE Only take medications as instructed by your medical team. Complete the entire course of an antibiotic. Drink plenty of fluids and get plenty of rest. Avoid close contacts especially the very young and the elderly Cover your mouth if you cough or cough into your sleeve. Always remember to wash your hands A steam or ultrasonic humidifier can help congestion.   GET HELP RIGHT AWAY IF: You develop worsening fever. You become short of breath You cough up blood. Your symptoms persist after you have completed your treatment plan MAKE SURE YOU  Understand these instructions. Will watch your condition. Will get help right away if you are not doing well or get worse.    Thank you for choosing an e-visit.  Your e-visit answers were reviewed by a board certified advanced clinical practitioner to complete your personal care plan. Depending upon the condition, your plan could have included both over the counter or prescription medications.  Please review your pharmacy choice. Make sure the pharmacy is open so you can pick up  prescription now. If there is a problem, you may contact your provider through [employer] and have the prescription routed to another pharmacy.  Your safety is important to [place]. If you have drug allergies check your prescription carefully.   For the next 24 hours you can use MyChart to ask questions about today's visit, request a non-urgent call back, or ask for a work or school excuse. You will get an email in the next two days  asking about your experience. I hope that your e-visit has been valuable and will speed your recovery.  I have spent at least 5 minutes reviewing and documenting in the patient's chart.

## 2021-02-16 ENCOUNTER — Telehealth: Payer: Self-pay | Admitting: Pulmonary Disease

## 2021-02-16 NOTE — Telephone Encounter (Signed)
Pt calling because the last 3-4 nights pt has woken up in the night needing to use her rescue inhaler-pt states she feels like she cannot breathe. Pt states she has also had to use it during the day as well. Not sure what is going on. Please advise [phone]

## 2021-02-16 NOTE — Telephone Encounter (Signed)
Called and spoke to patient.  Patient stated that over the past 3-4 days, her need for albuterol has increased.  She is using albuterol HFA TID and albuterol solution TID. She is waking up during the night with sob and has to do a breathing treatment.  C/o increased sob, wheezing mainly at night and non prod cough She is using Breztri BID Denied f/c/s or additional sx. Not vaccinated against flu.  2 covid vaccines. No recent flu or covid test.   Dr. Patsey Berthold, please advise. Thanks

## 2021-02-16 NOTE — Telephone Encounter (Signed)
Recommend Urgent Care for flu testing etc.

## 2021-02-16 NOTE — Telephone Encounter (Signed)
Patient is aware of below message and voiced her understanding.  Nothing further needed at this time.   

## 2021-02-26 ENCOUNTER — Telehealth: Payer: Medicare Other | Admitting: Nurse Practitioner

## 2021-02-26 ENCOUNTER — Other Ambulatory Visit: Payer: Self-pay | Admitting: Internal Medicine

## 2021-02-26 DIAGNOSIS — J209 Acute bronchitis, unspecified: Secondary | ICD-10-CM | POA: Diagnosis not present

## 2021-02-26 DIAGNOSIS — J440 Chronic obstructive pulmonary disease with acute lower respiratory infection: Secondary | ICD-10-CM | POA: Diagnosis not present

## 2021-02-26 MED ORDER — PREDNISONE 20 MG PO TABS: 20.0000 mg | ORAL_TABLET | Freq: Two times a day (BID) | ORAL | 0 refills | Status: AC

## 2021-02-26 MED ORDER — AZITHROMYCIN 250 MG PO TABS: ORAL_TABLET | ORAL | 0 refills | Status: AC

## 2021-02-26 MED ORDER — BENZONATATE 100 MG PO CAPS
100.0000 mg | ORAL_CAPSULE | Freq: Three times a day (TID) | ORAL | 0 refills | Status: DC | PRN
Start: 2021-02-26 — End: 2021-04-12

## 2021-02-26 NOTE — Progress Notes (Signed)
We are sorry that you are not feeling well.  Here is how we plan to help!  Based on your presentation I believe you most likely have A cough due to bacteria.  When patients have a fever and a productive cough with a change in color or increased sputum production, we are concerned about bacterial bronchitis.  If left untreated it can progress to pneumonia.  If your symptoms do not improve with your treatment plan it is important that you contact your provider.   I have prescribed Azithromyin 250 mg: two tablets now and then one tablet daily for 4 additonal days    In addition you may use A prescription cough medication called Tessalon Perles 100mg . You may take 1-2 capsules every 8 hours as needed for your cough. We have also called in: Prednisone 20 mg twice daily for 5 days, be sure to take this with food.   From your responses in the eVisit questionnaire you describe inflammation in the upper respiratory tract which is causing a significant cough.  This is commonly called Bronchitis and has four common causes:   Allergies Viral Infections Acid Reflux Bacterial Infection Allergies, viruses and acid reflux are treated by controlling symptoms or eliminating the cause. An example might be a cough caused by taking certain blood pressure medications. You stop the cough by changing the medication. Another example might be a cough caused by acid reflux. Controlling the reflux helps control the cough.  USE OF BRONCHODILATOR ("RESCUE") INHALERS: There is a risk from using your bronchodilator too frequently.  The risk is that over-reliance on a medication which only relaxes the muscles surrounding the breathing tubes can reduce the effectiveness of medications prescribed to reduce swelling and congestion of the tubes themselves.  Although you feel brief relief from the bronchodilator inhaler, your asthma may actually be worsening with the tubes becoming more swollen and filled with mucus.  This can delay  other crucial treatments, such as oral steroid medications. If you need to use a bronchodilator inhaler daily, several times per day, you should discuss this with your provider.  There are probably better treatments that could be used to keep your asthma under control.     HOME CARE Only take medications as instructed by your medical team. Complete the entire course of an antibiotic. Drink plenty of fluids and get plenty of rest. Avoid close contacts especially the very young and the elderly Cover your mouth if you cough or cough into your sleeve. Always remember to wash your hands A steam or ultrasonic humidifier can help congestion.   GET HELP RIGHT AWAY IF: You develop worsening fever. You become short of breath You cough up blood. Your symptoms persist after you have completed your treatment plan MAKE SURE YOU  Understand these instructions. Will watch your condition. Will get help right away if you are not doing well or get worse.    Thank you for choosing an e-visit.  Your e-visit answers were reviewed by a board certified advanced clinical practitioner to complete your personal care plan. Depending upon the condition, your plan could have included both over the counter or prescription medications.  Please review your pharmacy choice. Make sure the pharmacy is open so you can pick up prescription now. If there is a problem, you may contact your provider through [employer] and have the prescription routed to another pharmacy.  Your safety is important to [place]. If you have drug allergies check your prescription carefully.   For the  next 24 hours you can use MyChart to ask questions about today's visit, request a non-urgent call back, or ask for a work or school excuse. You will get an email in the next two days asking about your experience. I hope that your e-visit has been valuable and will speed your recovery.   I spent approximately 7 minutes reviewing the patient's  history, current symptoms and coordinating their plan of care today.    Meds ordered this encounter  Medications   azithromycin (ZITHROMAX) 250 MG tablet    Sig: Take 2 tablets on day 1, then 1 tablet daily on days 2 through 5    Dispense:  6 tablet    Refill:  0   benzonatate (TESSALON) 100 MG capsule    Sig: Take 1 capsule (100 mg total) by mouth 3 (three) times daily as needed for cough.    Dispense:  30 capsule    Refill:  0   predniSONE (DELTASONE) 20 MG tablet    Sig: Take 1 tablet (20 mg total) by mouth 2 (two) times daily with a meal for 5 days.    Dispense:  10 tablet    Refill:  0

## 2021-03-03 ENCOUNTER — Telehealth: Payer: Medicare Other | Admitting: Nurse Practitioner

## 2021-03-03 DIAGNOSIS — R051 Acute cough: Secondary | ICD-10-CM

## 2021-03-03 NOTE — Progress Notes (Signed)
We are sorry that you are not feeling well.  Here is how we plan to help!  Based on your presentation I believe you most likely have A cough due to a virus.  This is called viral bronchitis and is best treated by rest, plenty of fluids and control of the cough.  You may use Ibuprofen or Tylenol as directed to help your symptoms.     In addition you may use A prescription cough medication called Tessalon Perles 100mg . You may take 1-2 capsules every 8 hours as needed for your cough.  Unfortunately this cough can last several weeks. You will jst have to continue to treat symoptoms. Since have no fever another antibiotic will not help.  From your responses in the eVisit questionnaire you describe inflammation in the upper respiratory tract which is causing a significant cough.  This is commonly called Bronchitis and has four common causes:   Allergies Viral Infections Acid Reflux Bacterial Infection Allergies, viruses and acid reflux are treated by controlling symptoms or eliminating the cause. An example might be a cough caused by taking certain blood pressure medications. You stop the cough by changing the medication. Another example might be a cough caused by acid reflux. Controlling the reflux helps control the cough.  USE OF BRONCHODILATOR ("RESCUE") INHALERS: There is a risk from using your bronchodilator too frequently.  The risk is that over-reliance on a medication which only relaxes the muscles surrounding the breathing tubes can reduce the effectiveness of medications prescribed to reduce swelling and congestion of the tubes themselves.  Although you feel brief relief from the bronchodilator inhaler, your asthma may actually be worsening with the tubes becoming more swollen and filled with mucus.  This can delay other crucial treatments, such as oral steroid medications. If you need to use a bronchodilator inhaler daily, several times per day, you should discuss this with your provider.   There are probably better treatments that could be used to keep your asthma under control.     HOME CARE Only take medications as instructed by your medical team. Complete the entire course of an antibiotic. Drink plenty of fluids and get plenty of rest. Avoid close contacts especially the very young and the elderly Cover your mouth if you cough or cough into your sleeve. Always remember to wash your hands A steam or ultrasonic humidifier can help congestion.   GET HELP RIGHT AWAY IF: You develop worsening fever. You become short of breath You cough up blood. Your symptoms persist after you have completed your treatment plan MAKE SURE YOU  Understand these instructions. Will watch your condition. Will get help right away if you are not doing well or get worse.    Thank you for choosing an e-visit.  Your e-visit answers were reviewed by a board certified advanced clinical practitioner to complete your personal care plan. Depending upon the condition, your plan could have included both over the counter or prescription medications.  Please review your pharmacy choice. Make sure the pharmacy is open so you can pick up prescription now. If there is a problem, you may contact your provider through [employer] and have the prescription routed to another pharmacy.  Your safety is important to [place]. If you have drug allergies check your prescription carefully.   For the next 24 hours you can use MyChart to ask questions about today's visit, request a non-urgent call back, or ask for a work or school excuse. You will get an email in the  next two days asking about your experience. I hope that your e-visit has been valuable and will speed your recovery.  5-10 minutes spent reviewing and documenting in chart.

## 2021-03-07 ENCOUNTER — Telehealth: Payer: Self-pay | Admitting: Pulmonary Disease

## 2021-03-07 DIAGNOSIS — J328 Other chronic sinusitis: Secondary | ICD-10-CM

## 2021-03-07 MED ORDER — AZITHROMYCIN 250 MG PO TABS: ORAL_TABLET | ORAL | 0 refills | Status: AC

## 2021-03-07 MED ORDER — BREZTRI AEROSPHERE 160-9-4.8 MCG/ACT IN AERO: 2.0000 | INHALATION_SPRAY | Freq: Two times a day (BID) | RESPIRATORY_TRACT | 6 refills | Status: DC

## 2021-03-07 MED ORDER — PREDNISONE 10 MG (21) PO TBPK: ORAL_TABLET | ORAL | 0 refills | Status: DC

## 2021-03-07 NOTE — Telephone Encounter (Signed)
It appears that she gets triggered because of her sinuses.  I recommend that we refer her to ENT for evaluation of her sinuses.  She also needs to quit smoking entirely.  Not even 1 cigarette a day.  This will cause increase inflammation in the upper airway.  Need to know how that she do with the [facility] that was given to her during the last visit.  I do not think that Breo alone will keep her controlled.  We will send in prednisone taper and Z-Pak.

## 2021-03-07 NOTE — Telephone Encounter (Signed)
Patient is aware of recommendations and voiced her understanding. She is willing to see ENT. Referral has been placed.  She stated that she completed samples of Breztri and then resumed Breo. She feels that [employer] worked well for her.  Rx sent to preferred pharmacy for Breztri, prednisone and zpak. Nothing further needed at this time.

## 2021-03-07 NOTE — Telephone Encounter (Signed)
Spoke to patient.  Patient stated that she has been experiencing head congestion, dry cough and wheezing for 2 weeks. It is now draining into her chest. Sob is baseline.  Denies f/c/s or additional sx. She is using mucinex BID, albuterol HFA BID, albuterol solution TID and Breo once daily. Two covid vaccines and flu.  Negative home covid test 5 days ago. Currently smoking 4 cigarettes daily.   Dr. Patsey Berthold, please advise.

## 2021-03-13 ENCOUNTER — Ambulatory Visit: Payer: Medicare Other | Admitting: Pulmonary Disease

## 2021-03-26 ENCOUNTER — Telehealth: Payer: Medicare Other | Admitting: Nurse Practitioner

## 2021-03-26 DIAGNOSIS — J069 Acute upper respiratory infection, unspecified: Secondary | ICD-10-CM

## 2021-03-26 MED ORDER — FLUTICASONE PROPIONATE 50 MCG/ACT NA SUSP: 2.0000 | Freq: Every day | NASAL | 6 refills | Status: DC

## 2021-03-26 NOTE — Progress Notes (Signed)
E-Visit for Sinus Problems  We are sorry that you are not feeling well.  Here is how we plan to help!  Based on what you have shared with me it looks like you have sinusitis.  Sinusitis is inflammation and infection in the sinus cavities of the head.  Based on your presentation I believe you most likely have Acute Viral Sinusitis.This is an infection most likely caused by a virus. There is not specific treatment for viral sinusitis other than to help you with the symptoms until the infection runs its course.  You may use an oral decongestant such as Mucinex D or if you have glaucoma or high blood pressure use plain Mucinex. Saline nasal spray help and can safely be used as often as needed for congestion, I have prescribed: Fluticasone nasal spray two sprays in each nostril once a day  Providers prescribe antibiotics to treat infections caused by bacteria. Antibiotics are very powerful in treating bacterial infections when they are used properly. To maintain their effectiveness, they should be used only when necessary. Overuse of antibiotics has resulted in the development of superbugs that are resistant to treatment!    After careful review of your answers, I would not recommend an antibiotic for your condition.  Antibiotics are not effective against viruses and therefore should not be used to treat them. Common examples of infections caused by viruses include colds and flu   Some authorities believe that zinc sprays or the use of Echinacea may shorten the course of your symptoms.  Sinus infections are not as easily transmitted as other respiratory infection, however we still recommend that you avoid close contact with loved ones, especially the very young and elderly.  Remember to wash your hands thoroughly throughout the day as this is the number one way to prevent the spread of infection!  Home Care: Only take medications as instructed by your medical team. Do not take these medications with  alcohol. A steam or ultrasonic humidifier can help congestion.  You can place a towel over your head and breathe in the steam from hot water coming from a faucet. Avoid close contacts especially the very young and the elderly. Cover your mouth when you cough or sneeze. Always remember to wash your hands.  Get Help Right Away If: You develop worsening fever or sinus pain. You develop a severe head ache or visual changes. Your symptoms persist after you have completed your treatment plan.  Make sure you Understand these instructions. Will watch your condition. Will get help right away if you are not doing well or get worse.   Thank you for choosing an e-visit.  Your e-visit answers were reviewed by a board certified advanced clinical practitioner to complete your personal care plan. Depending upon the condition, your plan could have included both over the counter or prescription medications.  Please review your pharmacy choice. Make sure the pharmacy is open so you can pick up prescription now. If there is a problem, you may contact your provider through [employer] and have the prescription routed to another pharmacy.  Your safety is important to [place]. If you have drug allergies check your prescription carefully.   For the next 24 hours you can use MyChart to ask questions about today's visit, request a non-urgent call back, or ask for a work or school excuse. You will get an email in the next two days asking about your experience. I hope that your e-visit has been valuable and will speed your recovery.  I spent approximately 7 minutes reviewing the patient's history, current symptoms and coordinating their plan of care today.

## 2021-04-10 ENCOUNTER — Other Ambulatory Visit: Payer: Self-pay

## 2021-04-10 ENCOUNTER — Emergency Department: Payer: Medicare Other

## 2021-04-10 ENCOUNTER — Emergency Department
Admission: EM | Admit: 2021-04-10 | Discharge: 2021-04-10 | Disposition: A | Discharge status: Home / Self Care | Payer: Medicare Other | Attending: Emergency Medicine | Admitting: Emergency Medicine

## 2021-04-10 DIAGNOSIS — I2510 Atherosclerotic heart disease of native coronary artery without angina pectoris: Secondary | ICD-10-CM | POA: Diagnosis not present

## 2021-04-10 DIAGNOSIS — J441 Chronic obstructive pulmonary disease with (acute) exacerbation: Secondary | ICD-10-CM | POA: Insufficient documentation

## 2021-04-10 DIAGNOSIS — R0602 Shortness of breath: Secondary | ICD-10-CM | POA: Diagnosis not present

## 2021-04-10 DIAGNOSIS — I10 Essential (primary) hypertension: Secondary | ICD-10-CM | POA: Diagnosis not present

## 2021-04-10 DIAGNOSIS — Z87891 Personal history of nicotine dependence: Secondary | ICD-10-CM | POA: Diagnosis not present

## 2021-04-10 DIAGNOSIS — R059 Cough, unspecified: Secondary | ICD-10-CM | POA: Diagnosis not present

## 2021-04-10 LAB — BASIC METABOLIC PANEL
Anion gap: 10 (ref 5–15)
BUN: 14 mg/dL (ref 6–20)
CO2: 28 mmol/L (ref 22–32)
Calcium: 9.4 mg/dL (ref 8.9–10.3)
Chloride: 98 mmol/L (ref 98–111)
Creatinine, Ser: 0.7 mg/dL (ref 0.44–1.00)
GFR, Estimated: >60 mL/min (ref >60)
Glucose, Bld: 89 mg/dL (ref 70–99)
Potassium: 4 mmol/L (ref 3.5–5.1)
Sodium: 136 mmol/L (ref 135–145)

## 2021-04-10 LAB — CBC
HCT: 43.6 % (ref 36.0–46.0)
Hemoglobin: 14.1 g/dL (ref 12.0–15.0)
MCH: 26.8 pg (ref 26.0–34.0)
MCHC: 32.3 g/dL (ref 30.0–36.0)
MCV: 82.9 fL (ref 80.0–100.0)
Platelets: 287 10*3/uL (ref 150–400)
RBC: 5.26 MIL/uL — ABNORMAL HIGH (ref 3.87–5.11)
RDW: 14.6 % (ref 11.5–15.5)
WBC: 12.2 10*3/uL — ABNORMAL HIGH (ref 4.0–10.5)
nRBC: 0 % (ref 0.0–0.2)

## 2021-04-10 MED ORDER — AZITHROMYCIN 500 MG PO TABS
500.0000 mg | ORAL_TABLET | Freq: Once | ORAL | Status: AC
Start: 2021-04-10 — End: 2021-04-10
  Administered 2021-04-10: 500 mg via ORAL
  Filled 2021-04-10: qty 1

## 2021-04-10 MED ORDER — AZITHROMYCIN 250 MG PO TABS: ORAL_TABLET | ORAL | 0 refills | Status: AC

## 2021-04-10 MED ORDER — IPRATROPIUM-ALBUTEROL 0.5-2.5 (3) MG/3ML IN SOLN
3.0000 mL | Freq: Once | RESPIRATORY_TRACT | Status: AC
  Administered 2021-04-10: 3 mL via RESPIRATORY_TRACT
  Filled 2021-04-10: qty 3

## 2021-04-10 MED ORDER — PREDNISONE 20 MG PO TABS
60.0000 mg | ORAL_TABLET | Freq: Once | ORAL | Status: AC
  Administered 2021-04-10: 60 mg via ORAL
  Filled 2021-04-10: qty 3

## 2021-04-10 MED ORDER — PREDNISONE 50 MG PO TABS: ORAL_TABLET | ORAL | 0 refills | Status: DC

## 2021-04-10 NOTE — ED Provider Notes (Signed)
[facility] Provider Note    Event Date/Time   First MD Initiated Contact with Patient 04/10/21 1322     (approximate)   History   Shortness of Breath   HPI  Andrea [name] is a 56 y.o. female with past medical history of COPD, nonobstructive CAD, cutaneous lupus who presents with shortness of breath.  Symptoms started yesterday.  She endorses cough productive of yellow sputum as well as dyspnea.  Denies chest pain or lower extremity edema.  No history of blood clots.  Denies fevers chills nausea vomiting abdominal pain or diarrhea.  Is been using albuterol inhaler at home with some relief.  No recent steroids.    Past Medical History:  Diagnosis Date   Anxiety    panic attacks, chest pain   Arthritis    COPD (chronic obstructive pulmonary disease) (HCC)    GERD (gastroesophageal reflux disease)    Hypertension    not medicated   Non-obstructive CAD in native artery    a. cardiac cath 01/2014: showed minor irregularities, mildly elevated left ventricular end-diastolic pressure and normal ejection fraction; b. 03/2015 MV: low risk.   Palpitations    a. 08/2016 Event Monitor: occas PVC's, two brief runs of SVT (4 and 7 beats).   PONV (postoperative nausea and vomiting)    Symptomatic PVC's (premature ventricular contractions)    Tobacco abuse    Wears dentures    partial upper and lower    Patient Active Problem List   Diagnosis Date Noted   Acute hypoxemic respiratory failure ([place]) 06/13/2020   Personal history of colonic polyps    Polyp of transverse colon    Varicose veins of bilateral lower extremities with pain 11/22/2016   Restless leg 11/18/2016   Iron deficiency 11/18/2016   Allergic rhinitis 06/11/2016   Hyperlipidemia 03/19/2015   COPD exacerbation ([place]) 10/04/2014   Depression 09/22/2014   Dyshidrosis 09/22/2014   GERD (gastroesophageal reflux disease) 09/22/2014   Insomnia 09/22/2014   Lung nodule, multiple 09/22/2014   Panic  disorder 09/22/2014   Palpitations 01/17/2014   Pulmonary nodule 06/10/2013   Shortness of breath 05/20/2013   COPD, GOLD B 05/20/2013   Tobacco abuse 05/20/2013   Daytime somnolence 05/20/2013   Back pain 08/28/2012   Discoid lupus 05/30/2012   Generalized abdominal pain 03/11/2012   Pain in joint 03/09/2012   Psoriasis 02/21/2012   Fibromyalgia 02/11/2012   Lupus ([place]) 02/11/2012   Panic attacks 02/11/2012   History of adenomatous polyp of colon 09/13/2011   Heartburn 08/06/2011   PVC's (premature ventricular contractions) 01/29/2011   Essential (primary) hypertension 03/04/1998     Physical Exam  Triage Vital Signs: ED Triage Vitals  Enc Vitals Group     BP 04/10/21 1258 118/72     Pulse Rate 04/10/21 1258 94     Resp 04/10/21 1257 20     Temp 04/10/21 1257 98.6 F (37 C)     Temp src --      SpO2 04/10/21 1258 93 %     Weight 04/10/21 1257 154 lb (69.9 kg)     Height 04/10/21 1257 5\' 3"  (1.6 m)     Head Circumference --      Peak Flow --      Pain Score 04/10/21 1257 0     Pain Loc --      Pain Edu? --      Excl. in [place]? --     Most recent vital signs: Vitals:  04/10/21 1430 04/10/21 1500  BP: (!) 115/55 125/76  Pulse: 93 93  Resp: 19 (!) 25  Temp:    SpO2: 96% 98%     General: Awake, no distress.  CV:  Good peripheral perfusion.  Resp:  Normal effort.  Frequent cough, expiratory wheezing Abd:  No distention.  Neuro:             Awake, Alert, Oriented x 3  Other:  No lower extremity edema   ED Results / Procedures / Treatments  Labs (all labs ordered are listed, but only abnormal results are displayed) Labs Reviewed  CBC - Abnormal; Notable for the following components:      Result Value   WBC 12.2 (*)    RBC 5.26 (*)    All other components within normal limits  BASIC METABOLIC PANEL     EKG  EKG interpretation performed by myself: NSR, right axis, nml intervals, no acute ischemic changes    RADIOLOGY I reviewed the CXR which does  not show any acute cardiopulmonary process; agree with radiology report     PROCEDURES:  Critical Care performed: No  .1-3 Lead EKG Interpretation Performed by: Rada Hay, MD Authorized by: Rada Hay, MD     Interpretation: normal     ECG rate assessment: normal     Rhythm: sinus rhythm     Ectopy: none     Conduction: normal    The patient is on the cardiac monitor to evaluate for evidence of arrhythmia and/or significant heart rate changes.   MEDICATIONS ORDERED IN ED: Medications  predniSONE (DELTASONE) tablet 60 mg (60 mg Oral Given 04/10/21 1428)  azithromycin (ZITHROMAX) tablet 500 mg (500 mg Oral Given 04/10/21 1428)  ipratropium-albuterol (DUONEB) 0.5-2.5 (3) MG/3ML nebulizer solution 3 mL (3 mLs Nebulization Given 04/10/21 1430)  ipratropium-albuterol (DUONEB) 0.5-2.5 (3) MG/3ML nebulizer solution 3 mL (3 mLs Nebulization Given 04/10/21 1430)  ipratropium-albuterol (DUONEB) 0.5-2.5 (3) MG/3ML nebulizer solution 3 mL (3 mLs Nebulization Given 04/10/21 1430)     IMPRESSION / MDM / ASSESSMENT AND PLAN / ED COURSE  I reviewed the triage vital signs and the nursing notes.                              Differential diagnosis includes, but is not limited to, COPD exacerbation, pneumonia, viral illness, pulmonary embolism  This patient is a 56 year old female with a history of COPD not on oxygen who presents with cough and shortness of breath.  Feels typical of a COPD exacerbation for her.  Vital signs are within normal limits, she overall appears well does have somewhat diminished breath sounds with some expiratory wheezing.  Chest x-ray reviewed by myself does not have an infiltrate.  Her labs are reassuring, mild leukocytosis on CBC to 12.2, BMP within normal limits.  I reviewed her EKG which has no ischemic changes and patient has no chest pain.  Overall I think her presentation is consistent with COPD exacerbation.  Considered pulmonary embolism but given her cough  and wheezing I feel like this is less likely.  She was given 3 DuoNeb's and prednisone and felt improved.  Sats were 98% on room air.  Also received azithromycin given productive of yellow sputum.  Will discharge with 5 days of Pred and a Z-Pak.  We discussed return precautions.      FINAL CLINICAL IMPRESSION(S) / ED DIAGNOSES   Final diagnoses:  COPD exacerbation ([place])  Rx / DC Orders   ED Discharge Orders          Ordered    azithromycin (ZITHROMAX Z-PAK) 250 MG tablet        04/10/21 1503    predniSONE (DELTASONE) 50 MG tablet        04/10/21 1503             Note:  This document was prepared using Dragon voice recognition software and may include unintentional dictation errors.   Rada Hay, MD 04/10/21 [phone]

## 2021-04-10 NOTE — Discharge Instructions (Addendum)
Please take the prednisone for 5 days.  Please also take the antibiotic for the next 4 days.  If your breathing is worsening or you develop any new symptoms that are concerning to you such as chest pain, please return to the emergency department.  Please follow-up with your lung doctor as scheduled.

## 2021-04-10 NOTE — ED Notes (Signed)
RN at bedside to review discharge instructions. Pt verbalized understanding. Unable to obtain electronic signature at this time.

## 2021-04-10 NOTE — ED Triage Notes (Signed)
Pt reports COPD flare up for 2 days. Reports increased shob and spitting up yellow sputum. Denies cp. Pt able to speak in complete sentences. Unlabored RR

## 2021-04-12 ENCOUNTER — Ambulatory Visit (INDEPENDENT_AMBULATORY_CARE_PROVIDER_SITE_OTHER): Payer: Medicare Other | Admitting: Pulmonary Disease

## 2021-04-12 ENCOUNTER — Other Ambulatory Visit: Payer: Self-pay

## 2021-04-12 ENCOUNTER — Encounter: Payer: Self-pay | Admitting: Pulmonary Disease

## 2021-04-12 ENCOUNTER — Ambulatory Visit (INDEPENDENT_AMBULATORY_CARE_PROVIDER_SITE_OTHER): Payer: Medicare Other

## 2021-04-12 VITALS — BP 102/72 | HR 95 | Temp 97.8°F | Ht 63.0 in | Wt 146.0 lb

## 2021-04-12 DIAGNOSIS — Z720 Tobacco use: Secondary | ICD-10-CM

## 2021-04-12 DIAGNOSIS — J441 Chronic obstructive pulmonary disease with (acute) exacerbation: Secondary | ICD-10-CM

## 2021-04-12 DIAGNOSIS — Z0000 Encounter for general adult medical examination without abnormal findings: Secondary | ICD-10-CM

## 2021-04-12 DIAGNOSIS — F17210 Nicotine dependence, cigarettes, uncomplicated: Secondary | ICD-10-CM

## 2021-04-12 DIAGNOSIS — J4489 Other specified chronic obstructive pulmonary disease: Secondary | ICD-10-CM

## 2021-04-12 DIAGNOSIS — J449 Chronic obstructive pulmonary disease, unspecified: Secondary | ICD-10-CM

## 2021-04-12 MED ORDER — SPIRIVA RESPIMAT 2.5 MCG/ACT IN AERS: 2.5000 ug | INHALATION_SPRAY | Freq: Two times a day (BID) | RESPIRATORY_TRACT | 0 refills | Status: DC

## 2021-04-12 NOTE — Progress Notes (Signed)
Virtual Visit via Telephone Note  I connected with  Andrea [name] on 04/12/21 at  9:40 AM EST by telephone and verified that I am speaking with the correct person using two identifiers.  Location: Patient: home Provider: [facility] Persons participating in the virtual visit: Andrea [name]   I discussed the limitations, risks, security and privacy concerns of performing an evaluation and management service by telephone and the availability of in person appointments. The patient expressed understanding and agreed to proceed.  Interactive audio and video telecommunications were attempted between this nurse and patient, however failed, due to patient having technical difficulties OR patient did not have access to video capability.  We continued and completed visit with audio only.  Some vital signs may be absent or patient reported.   Dionisio David, LPN  Subjective:   Andrea [name] is a 56 y.o. female who presents for Medicare Annual (Subsequent) preventive examination.  Review of Systems           Objective:    Today's Vitals   04/12/21 0944  PainSc: 0-No pain   There is no height or weight on file to calculate BMI.  Advanced Directives 12/27/2020 06/12/2020 04/02/2020 01/09/2020 01/08/2020 01/06/2020 10/05/2019  Does Patient Have a Medical Advance Directive? No Yes No No No No No  Type of Advance Directive - Healthcare Power of [place];Living will - - - - -  Does patient want to make changes to medical advance directive? - No - Patient declined - - - - -  Copy of [place] in Chart? - No - copy requested - - - - -  Would patient like information on creating a medical advance directive? - No - Patient declined No - Patient declined No - Patient declined No - Patient declined No - Patient declined -    Current Medications (verified) Outpatient Encounter Medications as of 04/12/2021  Medication Sig   albuterol (PROVENTIL) (2.5 MG/3ML) 0.083% nebulizer  solution Take 3 mLs (2.5 mg total) by nebulization every 6 (six) hours as needed for wheezing or shortness of breath.   albuterol (PROVENTIL) (2.5 MG/3ML) 0.083% nebulizer solution USE 1 VIAL IN NEBULIZER EVERY 4 (FOUR) HOURS AS NEEDED FOR WHEEZING OR SHORTNESS OF BREATH.   albuterol (PROVENTIL) (2.5 MG/3ML) 0.083% nebulizer solution USE 1 VIAL IN NEBULIZER EVERY 4 (FOUR) HOURS AS NEEDED FOR WHEEZING OR SHORTNESS OF BREATH.   albuterol (VENTOLIN HFA) 108 (90 Base) MCG/ACT inhaler Inhale 2 puffs into the lungs every 6 (six) hours as needed for wheezing or shortness of breath.   azithromycin (ZITHROMAX Z-PAK) 250 MG tablet Please take 1 tablet (250mg ) once daily for the next 4 days   benzonatate (TESSALON) 100 MG capsule Take 1 capsule (100 mg total) by mouth 3 (three) times daily as needed for cough.   Budeson-Glycopyrrol-Formoterol (BREZTRI AEROSPHERE) 160-9-4.8 MCG/ACT AERO Inhale 2 puffs into the lungs in the morning and at bedtime.   clonazePAM (KLONOPIN) 1 MG tablet Take 1 tablet (1 mg total) by mouth 2 (two) times daily as needed.   dexlansoprazole (DEXILANT) 60 MG capsule Take 1 capsule (60 mg total) by mouth daily.   fluticasone (FLONASE) 50 MCG/ACT nasal spray Place 2 sprays into both nostrils daily.   hydroxychloroquine (PLAQUENIL) 200 MG tablet TAKE 2 TABLETS (400 MG TOTAL) BY MOUTH DAILY. FOR DISCOID LUPUS (L93.0)   Misc. Devices (PULSE OXIMETER DELUXE) MISC Use daily to monitor oxygen levels   Nebulizers (COMPRESSOR/NEBULIZER) MISC Supplies only   Nebulizers (COMPRESSOR/NEBULIZER)  MISC Use as directed   nitroGLYCERIN (NITROSTAT) 0.4 MG SL tablet Place 1 tablet (0.4 mg total) under the tongue every 5 (five) minutes as needed for chest pain.   pravastatin (PRAVACHOL) 20 MG tablet Take 1 tablet (20 mg total) by mouth daily.   predniSONE (DELTASONE) 50 MG tablet Take 1 pill daily   propranolol (INDERAL) 20 MG tablet TAKE 1 TABLET BY MOUTH TWICE A DAY   Respiratory Therapy Supplies (FLUTTER)  DEVI 1 Device by Does not apply route daily.   Spacer/Aero-Holding Chambers (AEROCHAMBER MV) inhaler Use as instructed   No facility-administered encounter medications on file as of 04/12/2021.    Allergies (verified) Alum & mag hydroxide-simeth, Aspirin, Bactrim [sulfamethoxazole-trimethoprim], Cefdinir, Chantix [varenicline], Doxycycline, Multaq [dronedarone], and Ivp dye [iodinated contrast media]   History: Past Medical History:  Diagnosis Date   Anxiety    panic attacks, chest pain   Arthritis    COPD (chronic obstructive pulmonary disease) (HCC)    GERD (gastroesophageal reflux disease)    Hypertension    not medicated   Non-obstructive CAD in native artery    a. cardiac cath 01/2014: showed minor irregularities, mildly elevated left ventricular end-diastolic pressure and normal ejection fraction; b. 03/2015 MV: low risk.   Palpitations    a. 08/2016 Event Monitor: occas PVC's, two brief runs of SVT (4 and 7 beats).   PONV (postoperative nausea and vomiting)    Symptomatic PVC's (premature ventricular contractions)    Tobacco abuse    Wears dentures    partial upper and lower   Past Surgical History:  Procedure Laterality Date   CARDIAC CATHETERIZATION  01/03/14   CARDIAC CATHETERIZATION     CHOLECYSTECTOMY     COLONOSCOPY WITH PROPOFOL N/A 10/05/2019   Procedure: COLONOSCOPY WITH PROPOFOL;  Surgeon: Lucilla Lame, MD;  Location: [facility];  Service: Endoscopy;  Laterality: N/A;   ESOPHAGOGASTRODUODENOSCOPY (EGD) WITH PROPOFOL N/A 07/11/2016   Procedure: ESOPHAGOGASTRODUODENOSCOPY (EGD) WITH PROPOFOL;  Surgeon: Lucilla Lame, MD;  Location: [place];  Service: Endoscopy;  Laterality: N/A;   ESOPHAGOGASTRODUODENOSCOPY (EGD) WITH PROPOFOL N/A 10/05/2019   Procedure: ESOPHAGOGASTRODUODENOSCOPY (EGD) WITH PROPOFOL;  Surgeon: Lucilla Lame, MD;  Location: [facility];  Service: Endoscopy;  Laterality: N/A;   EYE SURGERY     IMAGE GUIDED SINUS SURGERY N/A 11/30/2014    Procedure: IMAGE GUIDED SINUS SURGERY;  Surgeon: Carloyn Manner, MD;  Location: [employer];  Service: ENT;  Laterality: N/A;  GAVE DISK TO CE CE   MAXILLARY ANTROSTOMY Bilateral 11/30/2014   Procedure: MAXILLARY ANTROSTOMY;  Surgeon: Carloyn Manner, MD;  Location: [place];  Service: ENT;  Laterality: Bilateral;   SEPTOPLASTY N/A 11/30/2014   Procedure: SEPTOPLASTY;  Surgeon: Carloyn Manner, MD;  Location: [place];  Service: ENT;  Laterality: N/A;   SPHENOIDECTOMY Left 11/30/2014   Procedure: Coralee Pesa, ;  Surgeon: Carloyn Manner, MD;  Location: [place];  Service: ENT;  Laterality: Left;   VAGINAL HYSTERECTOMY  1995   vaginal   Family History  Problem Relation Age of Onset   Emphysema Father    Asthma Father    Heart disease Father    Heart attack Father    Arthritis Father        RA   Colon cancer Father    Hypertension Mother    Breast cancer Neg Hx    Social History   Socioeconomic History   Marital status: Married    Spouse name: Not on file   Number of children: 3  Years of education: Not on file   Highest education level: GED or equivalent  Occupational History   Occupation: Disabled    Comment: Social Security as of 05/21/2011  Tobacco Use   Smoking status: Every Day    Packs/day: 1.00    Years: 20.00    Pack years: 20.00    Types: Cigarettes   Smokeless tobacco: Never   Tobacco comments:    0.5 ppd - 01/30/21  Vaping Use   Vaping Use: Never used  Substance and Sexual Activity   Alcohol use: No    Alcohol/week: 0.0 standard drinks   Drug use: No   Sexual activity: Not Currently  Other Topics Concern   Not on file  Social History Narrative   Lives at home with husband. Independent at baseline.   Social Determinants of Health   Financial Resource Strain: Not on file  Food Insecurity: Not on file  Transportation Needs: Not on file  Physical Activity: Not on file  Stress: Not on file  Social  Connections: Not on file    Tobacco Counseling Ready to quit: Not Answered Counseling given: Not Answered Tobacco comments: 0.5 ppd - 01/30/21   Clinical Intake:  Pre-visit preparation completed: Yes  Pain : No/denies pain Pain Score: 0-No pain     Nutritional Risks: None Diabetes: No  How often do you need to have someone help you when you read instructions, pamphlets, or other written materials from your doctor or pharmacy?: 1 - Never  Diabetic?no  Interpreter Needed?: No  Information entered by :: Kirke Shaggy, LPN   Activities of Daily Living In your present state of health, do you have any difficulty performing the following activities: 06/12/2020  Hearing? N  Vision? N  Difficulty concentrating or making decisions? N  Walking or climbing stairs? N  Dressing or bathing? N  Doing errands, shopping? Y  Some recent data might be hidden    Patient Care Team: Birdie Sons, MD as PCP - General (Family Medicine) Flora Lipps, MD as Consulting Physician (Pulmonary Disease)  Indicate any recent Medical Services you may have received from other than [facility] providers in the past year (date may be approximate).     Assessment:   This is a routine wellness examination for Andrea [name].  Hearing/Vision screen No results found.  Dietary issues and exercise activities discussed:     Goals Addressed   None    Depression Screen PHQ 2/9 Scores 01/17/2020 08/11/2019 06/11/2016  PHQ - 2 Score 2 1 3   PHQ- 9 Score 12 - 16    Fall Risk Fall Risk  08/11/2019 06/11/2016  Falls in the past year? 1 Yes  Number falls in past yr: 0 1  Injury with Fall? 0 No  Follow up Falls prevention discussed -    FALL RISK PREVENTION PERTAINING TO THE HOME:  Any stairs in or around the home? No  If so, are there any without handrails? No  Home free of loose throw rugs in walkways, pet beds, electrical cords, etc? Yes  Adequate lighting in your home to reduce risk of falls? Yes    ASSISTIVE DEVICES UTILIZED TO PREVENT FALLS:  Life alert? No  Use of a cane, walker or w/c? No  Grab bars in the bathroom? Yes  Shower chair or bench in shower? No  Elevated toilet seat or a handicapped toilet? No   Cognitive Function:Normal cognitive status assessed by direct observation by this Nurse Health Advisor. No abnormalities found.  Immunizations Immunization History  Administered Date(s) Administered   [employer] Vaccination 11/28/2019, 12/26/2019   Pneumococcal Conjugate-13 11/20/2012   Pneumococcal Polysaccharide-23 08/10/2012    TDAP status: Due, Education has been provided regarding the importance of this vaccine. Advised may receive this vaccine at local pharmacy or Health Dept. Aware to provide a copy of the vaccination record if obtained from local pharmacy or Health Dept. Verbalized acceptance and understanding.  Flu Vaccine status: Declined, Education has been provided regarding the importance of this vaccine but patient still declined. Advised may receive this vaccine at local pharmacy or Health Dept. Aware to provide a copy of the vaccination record if obtained from local pharmacy or Health Dept. Verbalized acceptance and understanding.  Pneumococcal vaccine status: Up to date  Covid-19 vaccine status: Completed vaccines  Qualifies for Shingles Vaccine? Yes   Zostavax completed No   Shingrix Completed?: No.    Education has been provided regarding the importance of this vaccine. Patient has been advised to call insurance company to determine out of pocket expense if they have not yet received this vaccine. Advised may also receive vaccine at local pharmacy or Health Dept. Verbalized acceptance and understanding.  Screening Tests Health Maintenance  Topic Date Due   TETANUS/TDAP  Never done   Zoster Vaccines- Shingrix (1 of 2) Never done   MAMMOGRAM  11/26/2018   COVID-19 Vaccine (3 - Booster for Moderna series) 02/20/2020   INFLUENZA  VACCINE  06/01/2021 (Originally 10/02/2020)   COLONOSCOPY (Pts 45-61yrs Insurance coverage will need to be confirmed)  10/04/2024   Hepatitis C Screening  Completed   HIV Screening  Completed   HPV VACCINES  Aged Out    Health Maintenance  Health Maintenance Due  Topic Date Due   TETANUS/TDAP  Never done   Zoster Vaccines- Shingrix (1 of 2) Never done   MAMMOGRAM  11/26/2018   COVID-19 Vaccine (3 - Booster for Moderna series) 02/20/2020    Colorectal cancer screening: Type of screening: Colonoscopy. Completed 10/05/19. Repeat every 5 years  Mammogram status: Completed 11/25/16. Repeat every year   Lung Cancer Screening: (Low Dose CT Chest recommended if Age 39-80 years, 30 pack-year currently smoking OR have quit w/in 15years.) does not qualify.    Additional Screening:  Hepatitis C Screening: does qualify; Completed 06/11/16  Vision Screening: Recommended annual ophthalmology exams for early detection of glaucoma and other disorders of the eye. Is the patient up to date with their annual eye exam?  No  Who is the provider or what is the name of the office in which the patient attends annual eye exams? No one If pt is not established with a provider, would they like to be referred to a provider to establish care? No .   Dental Screening: Recommended annual dental exams for proper oral hygiene  Community Resource Referral / Chronic Care Management: CRR required this visit?  No   CCM required this visit?  No      Plan:     I have personally reviewed and noted the following in the patients chart:   Medical and social history Use of alcohol, tobacco or illicit drugs  Current medications and supplements including opioid prescriptions.  Functional ability and status Nutritional status Physical activity Advanced directives List of other physicians Hospitalizations, surgeries, and ER visits in previous 12 months Vitals Screenings to include cognitive, depression, and  falls Referrals and appointments  In addition, I have reviewed and discussed with patient certain preventive protocols, quality metrics, and best  practice recommendations. A written personalized care plan for preventive services as well as general preventive health recommendations were provided to patient.     Dionisio David, LPN   11/07/3690   Nurse Notes: none

## 2021-04-12 NOTE — Patient Instructions (Signed)
We are resuming Symbicort 160/4.5, 2 inhalations twice a day.  STOP BREZTRI  We are giving you a trial of Spiriva which you can take 2 puffs after your evening dose of Symbicort.  Spiriva is only once a day.  We are enrolling you in the lung cancer screening program.  We will see you in follow-up in 2 months time call sooner should any new problems arise.

## 2021-04-12 NOTE — Patient Instructions (Signed)
Andrea [name] , Thank you for taking time to come for your Medicare Wellness Visit. I appreciate your ongoing commitment to your health goals. Please review the following plan we discussed and let me know if I can assist you in the future.   Screening recommendations/referrals: Colonoscopy: 10/05/19 Mammogram: 11/25/16, referral sent Bone Density: n/d Recommended yearly ophthalmology/optometry visit for glaucoma screening and checkup Recommended yearly dental visit for hygiene and checkup  Vaccinations: Influenza vaccine: n/d Pneumococcal vaccine: 11/20/12 Tdap vaccine: n/d Shingles vaccine: n/d  Covid-19: 11/28/19, 12/26/19  Advanced directives: no  Conditions/risks identified: none  Next appointment: Follow up in one year for your annual wellness visit. 04/16/22 @ 9:40am by phone  Preventive Care 40-64 Years, Female Preventive care refers to lifestyle choices and visits with your health care provider that can promote health and wellness. What does preventive care include? A yearly physical exam. This is also called an annual well check. Dental exams once or twice a year. Routine eye exams. Ask your health care provider how often you should have your eyes checked. Personal lifestyle choices, including: Daily care of your teeth and gums. Regular physical activity. Eating a healthy diet. Avoiding tobacco and drug use. Limiting alcohol use. Practicing safe sex. Taking low-dose aspirin daily starting at age 42. Taking vitamin and mineral supplements as recommended by your health care provider. What happens during an annual well check? The services and screenings done by your health care provider during your annual well check will depend on your age, overall health, lifestyle risk factors, and family history of disease. Counseling  Your health care provider may ask you questions about your: Alcohol use. Tobacco use. Drug use. Emotional well-being. Home and relationship  well-being. Sexual activity. Eating habits. Work and work Statistician. Method of birth control. Menstrual cycle. Pregnancy history. Screening  You may have the following tests or measurements: Height, weight, and BMI. Blood pressure. Lipid and cholesterol levels. These may be checked every 5 years, or more frequently if you are over 73 years old. Skin check. Lung cancer screening. You may have this screening every year starting at age 63 if you have a 30-pack-year history of smoking and currently smoke or have quit within the past 15 years. Fecal occult blood test (FOBT) of the stool. You may have this test every year starting at age 60. Flexible sigmoidoscopy or colonoscopy. You may have a sigmoidoscopy every 5 years or a colonoscopy every 10 years starting at age 13. Hepatitis C blood test. Hepatitis B blood test. Sexually transmitted disease (STD) testing. Diabetes screening. This is done by checking your blood sugar (glucose) after you have not eaten for a while (fasting). You may have this done every 1-3 years. Mammogram. This may be done every 1-2 years. Talk to your health care provider about when you should start having regular mammograms. This may depend on whether you have a family history of breast cancer. BRCA-related cancer screening. This may be done if you have a family history of breast, ovarian, tubal, or peritoneal cancers. Pelvic exam and Pap test. This may be done every 3 years starting at age 10. Starting at age 22, this may be done every 5 years if you have a Pap test in combination with an HPV test. Bone density scan. This is done to screen for osteoporosis. You may have this scan if you are at high risk for osteoporosis. Discuss your test results, treatment options, and if necessary, the need for more tests with your health care provider. Vaccines  Your health care provider may recommend certain vaccines, such as: Influenza vaccine. This is recommended every  year. Tetanus, diphtheria, and acellular pertussis (Tdap, Td) vaccine. You may need a Td booster every 10 years. Zoster vaccine. You may need this after age 25. Pneumococcal 13-valent conjugate (PCV13) vaccine. You may need this if you have certain conditions and were not previously vaccinated. Pneumococcal polysaccharide (PPSV23) vaccine. You may need one or two doses if you smoke cigarettes or if you have certain conditions. Talk to your health care provider about which screenings and vaccines you need and how often you need them. This information is not intended to replace advice given to you by your health care provider. Make sure you discuss any questions you have with your health care provider. Document Released: 03/17/2015 Document Revised: 11/08/2015 Document Reviewed: 12/20/2014 Elsevier Interactive Patient Education  2017 [employer] Prevention in the Home Falls can cause injuries. They can happen to people of all ages. There are many things you can do to make your home safe and to help prevent falls. What can I do on the outside of my home? Regularly fix the edges of walkways and driveways and fix any cracks. Remove anything that might make you trip as you walk through a door, such as a raised step or threshold. Trim any bushes or trees on the path to your home. Use bright outdoor lighting. Clear any walking paths of anything that might make someone trip, such as rocks or tools. Regularly check to see if handrails are loose or broken. Make sure that both sides of any steps have handrails. Any raised decks and porches should have guardrails on the edges. Have any leaves, snow, or ice cleared regularly. Use sand or salt on walking paths during winter. Clean up any spills in your garage right away. This includes oil or grease spills. What can I do in the bathroom? Use night lights. Install grab bars by the toilet and in the tub and shower. Do not use towel bars as grab  bars. Use non-skid mats or decals in the tub or shower. If you need to sit down in the shower, use a plastic, non-slip stool. Keep the floor dry. Clean up any water that spills on the floor as soon as it happens. Remove soap buildup in the tub or shower regularly. Attach bath mats securely with double-sided non-slip rug tape. Do not have throw rugs and other things on the floor that can make you trip. What can I do in the bedroom? Use night lights. Make sure that you have a light by your bed that is easy to reach. Do not use any sheets or blankets that are too big for your bed. They should not hang down onto the floor. Have a firm chair that has side arms. You can use this for support while you get dressed. Do not have throw rugs and other things on the floor that can make you trip. What can I do in the kitchen? Clean up any spills right away. Avoid walking on wet floors. Keep items that you use a lot in easy-to-reach places. If you need to reach something above you, use a strong step stool that has a grab bar. Keep electrical cords out of the way. Do not use floor polish or wax that makes floors slippery. If you must use wax, use non-skid floor wax. Do not have throw rugs and other things on the floor that can make you trip. What  can I do with my stairs? Do not leave any items on the stairs. Make sure that there are handrails on both sides of the stairs and use them. Fix handrails that are broken or loose. Make sure that handrails are as long as the stairways. Check any carpeting to make sure that it is firmly attached to the stairs. Fix any carpet that is loose or worn. Avoid having throw rugs at the top or bottom of the stairs. If you do have throw rugs, attach them to the floor with carpet tape. Make sure that you have a light switch at the top of the stairs and the bottom of the stairs. If you do not have them, ask someone to add them for you. What else can I do to help prevent  falls? Wear shoes that: Do not have high heels. Have rubber bottoms. Are comfortable and fit you well. Are closed at the toe. Do not wear sandals. If you use a stepladder: Make sure that it is fully opened. Do not climb a closed stepladder. Make sure that both sides of the stepladder are locked into place. Ask someone to hold it for you, if possible. Clearly mark and make sure that you can see: Any grab bars or handrails. First and last steps. Where the edge of each step is. Use tools that help you move around (mobility aids) if they are needed. These include: Canes. Walkers. Scooters. Crutches. Turn on the lights when you go into a dark area. Replace any light bulbs as soon as they burn out. Set up your furniture so you have a clear path. Avoid moving your furniture around. If any of your floors are uneven, fix them. If there are any pets around you, be aware of where they are. Review your medicines with your doctor. Some medicines can make you feel dizzy. This can increase your chance of falling. Ask your doctor what other things that you can do to help prevent falls. This information is not intended to replace advice given to you by your health care provider. Make sure you discuss any questions you have with your health care provider. Document Released: 12/15/2008 Document Revised: 07/27/2015 Document Reviewed: 03/25/2014 Elsevier Interactive Patient Education  2017 [employer].

## 2021-04-12 NOTE — Progress Notes (Signed)
Subjective:    Patient ID: Andrea [name], female    DOB: Dec 21, 1965, 56 y.o.   MRN: [id number] Patient Care Team: Birdie Sons, MD as PCP - General (Family Medicine) Tyler Pita, MD as Consulting Physician (Pulmonary Disease)  Chief Complaint  Patient presents with   Follow-up   HPI Andrea [name] is a 56 year old current smoker (0.5 PPD) who presents for follow-up on the issue of COPD and dyspnea. I last saw her on 30 January 2021.  She has had multiple admissions and visits to the emergency room due to chronic obstructive pulmonary disease exacerbations.  Most recent ED visit was 2 days ago and she received a prednisone and Azithromycin Z pack at that time.  She is only maintained on Breztri 2 puffs twice a day, not able to afford wants to go back to Symbicort.  She had been trying to quit smoking but has experienced increasing stress in the family and increased her use of tobacco again.  She continues to smoke a half a pack of cigarettes a day.  She notices that she has to use albuterol multiple times during the day and albuterol nebulizer at least 1 hour before bedtime.  She notices wheezing in the mornings and in the evenings.  She also notes seasonal variation with wheezing and albuterol use becoming more pronounced with change of season.  Marland Kitchen  She was last seen in the ED as noted on 26 October and has not had another episode since then.  We reviewed PFTs that were performed on 14 November with the patient.  She has had some mild decrease in lung function but overall remains at GOLD class II-III.  Has not followed through with lung cancer screening.  At her prior visit allergen panel and CBC with differential failed to show any allergy component or eosinophilia.  IgE was 31 IU/mL.   DATA 06/18/2016 PFTs: FEV1 1.45 L or 60% predicted VC 2.38 L or 90% predicted FEV1/FVC 61%.  Lung volumes: TLC 102%, RV 140%. DLCO 57%.  No significant bronchodilator response. 09/25/2016 2D echo: LVEF 60 to  65%, normal study. 09/01/2020 overnight oximetry: No significant desaturations. 01/15/2021 PFTs: FEV1 1.46 L or 57% predicted, FVC 2.31 L or 70% predicted, FEV1/FVC 63%, no bronchodilator response.  TLC 113, RV 200% diffusion capacity 57%, correction by Kco 67%.  Review of Systems A 10 point review of systems was performed and it is as noted above otherwise negative.  Patient Active Problem List   Diagnosis Date Noted   Hiatal hernia 03/28/2022   S/P repair of paraesophageal hernia 03/28/2022   Acute gastritis without hemorrhage 02/21/2022   Chronic anterior uveitis of left eye 10/04/2021   Acute hypoxemic respiratory failure ([place]) 06/13/2020   Personal history of colonic polyps    Polyp of transverse colon    Varicose veins of bilateral lower extremities with pain 11/22/2016   Restless leg 11/18/2016   Iron deficiency 11/18/2016   Allergic rhinitis 06/11/2016   Hyperlipidemia 03/19/2015   COPD exacerbation ([place]) 10/04/2014   Depression 09/22/2014   Dyshidrosis 09/22/2014   GERD (gastroesophageal reflux disease) 09/22/2014   Insomnia 09/22/2014   Lung nodule, multiple 09/22/2014   Panic disorder 09/22/2014   Palpitations 01/17/2014   Pulmonary nodule 06/10/2013   Shortness of breath 05/20/2013   COPD, GOLD B 05/20/2013   Tobacco abuse 05/20/2013   Daytime somnolence 05/20/2013   Back pain 08/28/2012   Discoid lupus 05/30/2012   Pain in joint 03/09/2012   Psoriasis 02/21/2012  Fibromyalgia 02/11/2012   Lupus ([place]) 02/11/2012   Panic attacks 02/11/2012   History of adenomatous polyp of colon 09/13/2011   Heartburn 08/06/2011   PVC's (premature ventricular contractions) 01/29/2011   Essential (primary) hypertension 03/04/1998   Social History   Tobacco Use   Smoking status: Every Day    Packs/day: 0.50    Years: 20.00    Total pack years: 10.00    Types: Cigarettes    Passive exposure: Past   Smokeless tobacco: Never   Tobacco comments:    0.5 PPD 02/11/2022   Substance Use Topics   Alcohol use: No    Alcohol/week: 0.0 standard drinks of alcohol   Allergies  Allergen Reactions   Alum & Mag Hydroxide-Simeth Diarrhea and Nausea Only   Bactrim [Sulfamethoxazole-Trimethoprim] Itching   Cefdinir Other (See Comments)    tachycardia   Chantix [Varenicline]     Bad dreams   Chlorhexidine Gluconate Swelling   Doxycycline Other (See Comments)    Nausea, migraine   Multaq [Dronedarone] Other (See Comments)    Lip/ mouth numbness/ tongue swelling   Ivp Dye [Iodinated Contrast Media] Palpitations   Medications were reviewed with the patient.  She is currently on Breztri 2 puffs twice a day however, cannot forward.  Immunization History  Administered Date(s) Administered   Moderna Sars-Covid-2 Vaccination 11/28/2019, 12/26/2019   Pneumococcal Conjugate-13 11/20/2012   Pneumococcal Polysaccharide-23 08/10/2012      Objective:   Physical Exam BP 102/72 (BP Location: Left Arm, Patient Position: Sitting, Cuff Size: Normal)   Pulse 95   Temp 97.8 F (36.6 C) (Oral)   Ht 5\' 3"  (1.6 m)   Wt 146 lb (66.2 kg)   SpO2 96%   BMI 25.86 kg/m   GENERAL: Awake, alert, fully ambulatory, somewhat disheveled.  In no respiratory distress.  No conversational dyspnea.  HEAD: Normocephalic, atraumatic.  EYES: Pupils equal, round, reactive to light.  No scleral icterus.  MOUTH: Nose/mouth/throat not examined due to masking requirements for COVID 19.   NECK: Supple. No thyromegaly. Trachea midline. No JVD.  No adenopathy. PULMONARY: Good air entry bilaterally.  There are coarse breath sounds throughout, no wheezes. CARDIOVASCULAR: S1 and S2. Regular rate and rhythm.  No rubs, murmurs or gallops heard. ABDOMEN: Benign. MUSCULOSKELETAL: No joint deformity, no clubbing, no edema.  NEUROLOGIC: No overt focal deficit.  Speech is fluent.  No gait disturbance. SKIN: Intact,warm,dry. PSYCH: Mood is anxious, behavior normal.       Assessment & Plan:      ICD-10-CM   1. Asthma-COPD overlap syndrome (HCC)  J44.9    Wants to switch back to Symbicort Resume Symbicort 160/4.5, 2 inhalations twice a day Add Spiriva 2.5 mcg 2 puffs once daily, samples provided for the patient    2. COPD exacerbation (HCC)  J44.1    Currently on prednisone/Azithromycin Mild exacerbation Seen in ED 02/07    3. Tobacco dependence due to cigarettes  F17.210 Ambulatory Referral for Lung Cancer Scre   Needs to stop smoking at all costs Patient counseled regards discontinuation of smoking Reenroll in lung cancer screening program     Orders Placed This Encounter  Procedures   Ambulatory Referral for Lung Cancer Scre    Referral Priority:   Routine    Referral Type:   Consultation    Referral Reason:   Specialty Services Required    Referred to Provider:   Magdalen Spatz, NP    Number of Visits Requested:   1   Meds ordered  this encounter  Medications   Tiotropium Bromide Monohydrate (SPIRIVA RESPIMAT) 2.5 MCG/ACT AERS    Sig: Inhale 2.5 mcg into the lungs 2 (two) times daily.    Dispense:  4 g    Refill:  0    Order Specific Question:   Lot Number?    Answer:   [id number]    Order Specific Question:   Expiration Date?    Answer:   05/01/2022    Order Specific Question:   [place]    Answer:   [id number] [[id number]]    Order Specific Question:   Quantity    Answer:   2   Will see the patient in follow-up in 2 months time she is to contact us prior to that time should any new difficulties arise.  Renold Don, MD Advanced Bronchoscopy PCCM [facility]    *This note was dictated using voice recognition software/Dragon.  Despite best efforts to proofread, errors can occur which can change the meaning. Any transcriptional errors that result from this process are unintentional and may not be fully corrected at the time of dictation.

## 2021-04-13 ENCOUNTER — Telehealth: Payer: Self-pay | Admitting: Pulmonary Disease

## 2021-04-13 MED ORDER — BUDESONIDE-FORMOTEROL FUMARATE 160-4.5 MCG/ACT IN AERO: 2.0000 | INHALATION_SPRAY | Freq: Two times a day (BID) | RESPIRATORY_TRACT | 12 refills | Status: DC

## 2021-04-13 NOTE — Telephone Encounter (Signed)
Symbicort 160 has been sent to preferred pharmacy.  Patient is aware and voiced her understanding.  Nothing further needed.

## 2021-04-20 ENCOUNTER — Telehealth: Payer: Self-pay | Admitting: Pulmonary Disease

## 2021-04-20 ENCOUNTER — Other Ambulatory Visit: Payer: Self-pay | Admitting: Family Medicine

## 2021-04-20 MED ORDER — BREZTRI AEROSPHERE 160-9-4.8 MCG/ACT IN AERO: 2.0000 | INHALATION_SPRAY | Freq: Two times a day (BID) | RESPIRATORY_TRACT | 5 refills | Status: DC

## 2021-04-20 NOTE — Telephone Encounter (Signed)
Dr Patsey Berthold please advise:   Patient is currently coughing and would like to know what she should do? Pt states her cough is worse since starting Symbicort on 2/9. Would like to know if she should start back on Breztri instead of Symbicort.

## 2021-04-20 NOTE — Telephone Encounter (Signed)
She actually requested the change to Symbicort.  She can go back to [employer] 2 puffs twice a day.  It is also very important that she quit smoking.  Over-the-counter she can use Delsym or Mucinex DM extra strength for the cough.

## 2021-04-20 NOTE — Telephone Encounter (Addendum)
Patient is aware of recommendations and voiced her understanding.  Breztri sent to preferred pharmacy.  She is aware to stop Spiriva and symbicort.  Nothing further needed.

## 2021-04-20 NOTE — Addendum Note (Signed)
Addended by: Claudette Head A on: 04/20/2021 03:56 PM   Modules accepted: Orders

## 2021-04-21 NOTE — Telephone Encounter (Signed)
° °  Requested medication (s) are on the active medication list: yes  Last refill:  11/14/20 #60 with 0 RF  Future visit scheduled: 05/14/21  Notes to clinic:  This medication can not be delegated, prescriber not in this practice, please assess.        Requested Prescriptions  Pending Prescriptions Disp Refills   clonazePAM (KLONOPIN) 1 MG tablet [Pharmacy Med Name: CLONAZEPAM 1 MG TABLET] 60 tablet 3    Sig: TAKE 1 TABLET BY MOUTH 2 TIMES DAILY AS NEEDED.     Not Delegated - Psychiatry: Anxiolytics/Hypnotics 2 Failed - 04/20/2021 10:11 PM      Failed - This refill cannot be delegated      Failed - Urine Drug Screen completed in last 360 days      Passed - Patient is not pregnant      Passed - Valid encounter within last 6 months    Recent Outpatient Visits           3 months ago COPD, [place], Donald E, MD   6 months ago Shortness of breath   [facility] Birdie Sons, MD   10 months ago COPD exacerbation [facility])   [facility] Birdie Sons, MD   1 year ago COPD exacerbation [facility])   [facility] Birdie Sons, MD   1 year ago Acute non-recurrent frontal sinusitis   [facility] Birdie Sons, MD       Future Appointments             In 3 weeks Fisher, Kirstie Peri, MD [facility] [place]

## 2021-04-25 ENCOUNTER — Telehealth: Payer: Medicare Other | Admitting: Family

## 2021-04-25 DIAGNOSIS — J441 Chronic obstructive pulmonary disease with (acute) exacerbation: Secondary | ICD-10-CM | POA: Diagnosis not present

## 2021-04-25 MED ORDER — BACITRACIN-POLYMYXIN B 500-10000 UNIT/GM OP OINT: 1.0000 "application " | TOPICAL_OINTMENT | Freq: Two times a day (BID) | OPHTHALMIC | 0 refills | Status: DC

## 2021-04-25 MED ORDER — BENZONATATE 100 MG PO CAPS: 100.0000 mg | ORAL_CAPSULE | Freq: Three times a day (TID) | ORAL | 0 refills | Status: DC | PRN

## 2021-04-25 MED ORDER — PREDNISONE 10 MG (21) PO TBPK: ORAL_TABLET | ORAL | 0 refills | Status: DC

## 2021-04-25 MED ORDER — AZITHROMYCIN 250 MG PO TABS: ORAL_TABLET | ORAL | 0 refills | Status: DC

## 2021-04-25 NOTE — Progress Notes (Signed)
We are sorry that you are not feeling well.  Here is how we plan to help!  Based on your presentation I believe you most likely have A cough due to bacteria.  When patients have a fever and a productive cough with a change in color or increased sputum production, we are concerned about bacterial bronchitis.  If left untreated it can progress to pneumonia.  If your symptoms do not improve with your treatment plan it is important that you contact your provider.   I have prescribed Azithromyin 250 mg: two tablets now and then one tablet daily for 4 additonal days    In addition you may use A non-prescription cough medication called Robitussin DAC. Take 2 teaspoons every 8 hours or Delsym: take 2 teaspoons every 12 hours., A non-prescription cough medication called Mucinex DM: take 2 tablets every 12 hours., and A prescription cough medication called Tessalon Perles 100mg. You may take 1-2 capsules every 8 hours as needed for your cough.  Prednisone 10 mg daily for 6 days (see taper instructions below)  Directions for 6 day taper: Day 1: 2 tablets before breakfast, 1 after both lunch & dinner and 2 at bedtime Day 2: 1 tab before breakfast, 1 after both lunch & dinner and 2 at bedtime Day 3: 1 tab at each meal & 1 at bedtime Day 4: 1 tab at breakfast, 1 at lunch, 1 at bedtime Day 5: 1 tab at breakfast & 1 tab at bedtime Day 6: 1 tab at breakfast  From your responses in the eVisit questionnaire you describe inflammation in the upper respiratory tract which is causing a significant cough.  This is commonly called Bronchitis and has four common causes:   Allergies Viral Infections Acid Reflux Bacterial Infection Allergies, viruses and acid reflux are treated by controlling symptoms or eliminating the cause. An example might be a cough caused by taking certain blood pressure medications. You stop the cough by changing the medication. Another example might be a cough caused by acid reflux. Controlling the  reflux helps control the cough.  USE OF BRONCHODILATOR ("RESCUE") INHALERS: There is a risk from using your bronchodilator too frequently.  The risk is that over-reliance on a medication which only relaxes the muscles surrounding the breathing tubes can reduce the effectiveness of medications prescribed to reduce swelling and congestion of the tubes themselves.  Although you feel brief relief from the bronchodilator inhaler, your asthma may actually be worsening with the tubes becoming more swollen and filled with mucus.  This can delay other crucial treatments, such as oral steroid medications. If you need to use a bronchodilator inhaler daily, several times per day, you should discuss this with your provider.  There are probably better treatments that could be used to keep your asthma under control.     HOME CARE Only take medications as instructed by your medical team. Complete the entire course of an antibiotic. Drink plenty of fluids and get plenty of rest. Avoid close contacts especially the very young and the elderly Cover your mouth if you cough or cough into your sleeve. Always remember to wash your hands A steam or ultrasonic humidifier can help congestion.   GET HELP RIGHT AWAY IF: You develop worsening fever. You become short of breath You cough up blood. Your symptoms persist after you have completed your treatment plan MAKE SURE YOU  Understand these instructions. Will watch your condition. Will get help right away if you are not doing well or get worse.      Thank you for choosing an e-visit.  Your e-visit answers were reviewed by a board certified advanced clinical practitioner to complete your personal care plan. Depending upon the condition, your plan could have included both over the counter or prescription medications.  Please review your pharmacy choice. Make sure the pharmacy is open so you can pick up prescription now. If there is a problem, you may contact your  provider through MyChart messaging and have the prescription routed to another pharmacy.  Your safety is important to us. If you have drug allergies check your prescription carefully.   For the next 24 hours you can use MyChart to ask questions about today's visit, request a non-urgent call back, or ask for a work or school excuse. You will get an email in the next two days asking about your experience. I hope that your e-visit has been valuable and will speed your recovery.  .Approximately 5 minutes was spent documenting and reviewing patient's chart.    

## 2021-04-25 NOTE — Addendum Note (Signed)
Addended by: Evelina Dun A on: 04/25/2021 01:39 PM   Modules accepted: Orders

## 2021-04-29 ENCOUNTER — Encounter: Payer: Self-pay | Admitting: Pulmonary Disease

## 2021-04-30 ENCOUNTER — Telehealth: Payer: Self-pay | Admitting: Pulmonary Disease

## 2021-04-30 ENCOUNTER — Telehealth: Payer: Self-pay | Admitting: Gastroenterology

## 2021-04-30 ENCOUNTER — Telehealth: Payer: Self-pay

## 2021-04-30 DIAGNOSIS — J449 Chronic obstructive pulmonary disease, unspecified: Secondary | ICD-10-CM

## 2021-04-30 NOTE — Telephone Encounter (Signed)
ONO ordered. Please see 04/29/2021 mychart message.

## 2021-04-30 NOTE — Telephone Encounter (Signed)
Called and spoke to patient in regards to missed phone call. Attempted to schedule appt with BW for feb 28 patient unable to make new appt due to her daughter having PT on Tuesdays.  Nothing further needed

## 2021-04-30 NOTE — Telephone Encounter (Signed)
We can get an overnight oximetry and she can be set up with one of the nurse practitioners prior to her appointment with me in April.  However, she also should check in with cardiology.  She has not followed with them since 2021 and her symptoms could very well be cardiac.  I recommend that she follow-up with them as well.  What she is describing is a very common cardiac symptom.

## 2021-04-30 NOTE — Telephone Encounter (Signed)
Patient states medication needs an authorization for her insurance to cover, requesting call back.

## 2021-04-30 NOTE — Telephone Encounter (Signed)
See telephone encounter.

## 2021-04-30 NOTE — Telephone Encounter (Signed)
Dr. Patsey Berthold, please advise.  Pt is aware that you are unavailable until 05/03/2021

## 2021-05-02 ENCOUNTER — Encounter: Payer: Self-pay | Admitting: Gastroenterology

## 2021-05-02 NOTE — Telephone Encounter (Signed)
I called to let her know that we have not received anything that I can see from the pharmacy... Pt states she received a letter in the mail from [facility] stating that there is an authorization needed. I advised her to get a copy of her new insurance via [place] so that I can complete the PA... Pt states she will take a picture of cards and get back to me ?

## 2021-05-03 ENCOUNTER — Other Ambulatory Visit: Payer: Self-pay | Admitting: Family

## 2021-05-03 DIAGNOSIS — J441 Chronic obstructive pulmonary disease with (acute) exacerbation: Secondary | ICD-10-CM

## 2021-05-04 NOTE — Telephone Encounter (Signed)
PA was submitted via fax ?

## 2021-05-08 MED ORDER — PANTOPRAZOLE SODIUM 40 MG PO TBEC: 40.0000 mg | DELAYED_RELEASE_TABLET | Freq: Every day | ORAL | 3 refills | Status: DC

## 2021-05-08 NOTE — Addendum Note (Signed)
Addended by: Lurlean Nanny on: 05/08/2021 09:11 AM ? ? Modules accepted: Orders ? ?

## 2021-05-09 DIAGNOSIS — G4730 Sleep apnea, unspecified: Secondary | ICD-10-CM | POA: Diagnosis not present

## 2021-05-09 DIAGNOSIS — R0683 Snoring: Secondary | ICD-10-CM | POA: Diagnosis not present

## 2021-05-11 ENCOUNTER — Ambulatory Visit: Payer: Medicare Other | Admitting: Family Medicine

## 2021-05-12 ENCOUNTER — Other Ambulatory Visit: Payer: Self-pay | Admitting: Family Medicine

## 2021-05-14 ENCOUNTER — Ambulatory Visit: Payer: Medicare Other | Admitting: Family Medicine

## 2021-05-14 ENCOUNTER — Telehealth (INDEPENDENT_AMBULATORY_CARE_PROVIDER_SITE_OTHER): Payer: Medicare Other | Admitting: Family Medicine

## 2021-05-14 DIAGNOSIS — E785 Hyperlipidemia, unspecified: Secondary | ICD-10-CM

## 2021-05-14 DIAGNOSIS — F439 Reaction to severe stress, unspecified: Secondary | ICD-10-CM

## 2021-05-14 MED ORDER — ALPRAZOLAM 0.5 MG PO TABS: 0.5000 mg | ORAL_TABLET | Freq: Three times a day (TID) | ORAL | 1 refills | Status: DC | PRN

## 2021-05-14 NOTE — Progress Notes (Unsigned)
MyChart Video Visit    Virtual Visit via Video Note   This visit type was conducted due to national recommendations for restrictions regarding the COVID-19 Pandemic (e.g. social distancing) in an effort to limit this patient's exposure and mitigate transmission in our community. This patient is at least at moderate risk for complications without adequate follow up. This format is felt to be most appropriate for this patient at this time. Physical exam was limited by quality of the video and audio technology used for the visit.   Patient location: home Provider location: [facility]  I discussed the limitations of evaluation and management by telemedicine and the availability of in person appointments. The patient expressed understanding and agreed to proceed.  Patient: Andrea [name]   DOB: [birth year]   56 y.o. Female  MRN: [id number] Visit Date: 05/14/2021  Today's healthcare provider: Lelon Huh, MD   Chief Complaint  Patient presents with   Hyperlipidemia   Anxiety   Subjective    HPI  Lipid/Cholesterol, Follow-up  Last lipid panel Other pertinent labs  Lab Results  Component Value Date   CHOL 205 (H) 01/30/2021   HDL 39 (L) 01/30/2021   LDLCALC 148 (H) 01/30/2021   TRIG 98 01/30/2021   CHOLHDL 5.3 (H) 01/30/2021   Lab Results  Component Value Date   ALT 19 04/02/2020   AST 19 04/02/2020   PLT 287 04/10/2021   TSH 2.800 06/08/2019     She was last seen for this 3 months ago.  Management since that visit includes starting pravastatin '20mg'$  once a day.  She reports good compliance with treatment. She is not having side effects.   Symptoms: No chest pain No chest pressure/discomfort  No dyspnea No lower extremity edema  No numbness or tingling of extremity No orthopnea  No palpitations No paroxysmal nocturnal dyspnea  No speech difficulty No syncope   Current diet: in general, an "unhealthy" diet Current exercise: none  The 10-year ASCVD risk score (Arnett  DK, et al., 2019) is: 6.5%  ---------------------------------------------------------------------------------------------------  Anxiety, Follow-up  Patient reports that she has been under more stress lately due to her 49 year old daughters behavior.   She reports good compliance with treatment. She reports good tolerance of treatment. She is not having side effects.   She feels her anxiety is moderate and Worse since last visit.  Symptoms: No chest pain No difficulty concentrating  No dizziness Yes fatigue  No feelings of losing control Yes insomnia  No irritable No palpitations  No panic attacks No racing thoughts  Yes shortness of breath No sweating  No tremors/shakes    GAD-7 Results GAD-7 Generalized Anxiety Disorder Screening Tool 05/14/2021  1. Feeling Nervous, Anxious, or on Edge 2  2. Not Being Able to Stop or Control Worrying 2  3. Worrying Too Much About Different Things 2  4. Trouble Relaxing 3  5. Being So Restless it's Hard To Sit Still 0  6. Becoming Easily Annoyed or Irritable 1  7. Feeling Afraid As If Something Awful Might Happen 0  Total GAD-7 Score 10  Difficulty At Work, Home, or Getting  Along With Others? Not difficult at all    PHQ-9 Scores PHQ9 SCORE ONLY 05/14/2021 04/12/2021 01/17/2020  PHQ-9 Total Score 12 0 12    ---------------------------------------------------------------------------------------------------     Medications: Outpatient Medications Prior to Visit  Medication Sig   albuterol (PROVENTIL) (2.5 MG/3ML) 0.083% nebulizer solution USE 1 VIAL IN NEBULIZER EVERY 4 (FOUR) HOURS AS NEEDED  FOR WHEEZING OR SHORTNESS OF BREATH.   albuterol (VENTOLIN HFA) 108 (90 Base) MCG/ACT inhaler Inhale 2 puffs into the lungs every 6 (six) hours as needed for wheezing or shortness of breath.   Budeson-Glycopyrrol-Formoterol (BREZTRI AEROSPHERE) 160-9-4.8 MCG/ACT AERO Inhale 2 puffs into the lungs in the morning and at bedtime.   clonazePAM (KLONOPIN)  1 MG tablet TAKE 1 TABLET BY MOUTH 2 TIMES DAILY AS NEEDED.   hydroxychloroquine (PLAQUENIL) 200 MG tablet TAKE 2 TABLETS (400 MG TOTAL) BY MOUTH DAILY. FOR DISCOID LUPUS (L93.0)   Misc. Devices (PULSE OXIMETER DELUXE) MISC Use daily to monitor oxygen levels   Nebulizers (COMPRESSOR/NEBULIZER) MISC Supplies only   Nebulizers (COMPRESSOR/NEBULIZER) MISC Use as directed   nitroGLYCERIN (NITROSTAT) 0.4 MG SL tablet Place 1 tablet (0.4 mg total) under the tongue every 5 (five) minutes as needed for chest pain.   pantoprazole (PROTONIX) 40 MG tablet Take 1 tablet (40 mg total) by mouth daily.   pravastatin (PRAVACHOL) 20 MG tablet Take 1 tablet (20 mg total) by mouth daily.   propranolol (INDERAL) 20 MG tablet TAKE 1 TABLET BY MOUTH TWICE A DAY   Respiratory Therapy Supplies (FLUTTER) DEVI 1 Device by Does not apply route daily.   Spacer/Aero-Holding Chambers (AEROCHAMBER MV) inhaler Use as instructed   [DISCONTINUED] azithromycin (ZITHROMAX) 250 MG tablet Take 500 mg once, then 250 mg for four days   [DISCONTINUED] bacitracin-polymyxin b (POLYSPORIN) ophthalmic ointment Place 1 application into both eyes every 12 (twelve) hours. apply to eye every 12 hours while awake   [DISCONTINUED] benzonatate (TESSALON PERLES) 100 MG capsule Take 1 capsule (100 mg total) by mouth 3 (three) times daily as needed.   [DISCONTINUED] predniSONE (STERAPRED UNI-PAK 21 TAB) 10 MG (21) TBPK tablet Use as directed   No facility-administered medications prior to visit.    Review of Systems  Constitutional:  Positive for fatigue. Negative for appetite change, chills and fever.  Respiratory:  Negative for chest tightness and shortness of breath.   Cardiovascular:  Negative for chest pain and palpitations.  Gastrointestinal:  Negative for abdominal pain, nausea and vomiting.  Neurological:  Negative for dizziness and weakness.  Psychiatric/Behavioral:  The patient is nervous/anxious.    {Labs   Heme   Chem   Endocrine    Serology   Results Review (optional):23779}  Objective    There were no vitals taken for this visit. {Show previous vital signs (optional):23777}  Physical Exam   Awake, alert, oriented x 3. In no apparent distress   Assessment & Plan     1. Hyperlipidemia, unspecified hyperlipidemia type She is tolerating pravastatin well with no adverse effects.   - Comprehensive metabolic panel - Lipid panel  2. . Situational stress Had previously taken '1mg'$  alprazolam prn in addition to scheduled doses of clonazepam. She is agreeable to starting back on alprazolam prn only at a lower dose.  - ALPRAZolam (XANAX) 0.5 MG tablet; Take 1 tablet (0.5 mg total) by mouth every 8 (eight) hours as needed. May take between doses of clonazepam as needed.  Dispense: 30 tablet; Refill: 1     I discussed the assessment and treatment plan with the patient. The patient was provided an opportunity to ask questions and all were answered. The patient agreed with the plan and demonstrated an understanding of the instructions.   The patient was advised to call back or seek an in-person evaluation if the symptoms worsen or if the condition fails to improve as anticipated.  I provided 9 minutes  of non-face-to-face time during this encounter.  The entirety of the information documented in the History of Present Illness, Review of Systems and Physical Exam were personally obtained by me. Portions of this information were initially documented by the CMA and reviewed by me for thoroughness and accuracy.    Lelon Huh, MD [facility] [phone] (phone) [phone] (fax)  [place]

## 2021-05-16 ENCOUNTER — Encounter: Payer: Self-pay | Admitting: Family Medicine

## 2021-05-16 ENCOUNTER — Other Ambulatory Visit (HOSPITAL_COMMUNITY): Payer: Self-pay

## 2021-05-16 MED ORDER — HYDROXYCHLOROQUINE SULFATE 200 MG PO TABS
400.0000 mg | ORAL_TABLET | Freq: Every day | ORAL | 0 refills | Status: DC
  Filled 2021-05-16: qty 180, 90d supply, fill #0

## 2021-05-17 ENCOUNTER — Telehealth: Payer: Self-pay

## 2021-05-17 NOTE — Telephone Encounter (Signed)
Called and spoke to patient in regards to ONO results. Per Dr Mortimer Fries patient qualifies for 1L of o2 at night. Patient would like to proceed. Order placed to [facility]. Nothing further needed.  ?

## 2021-05-18 NOTE — Telephone Encounter (Signed)
Explained to patient that order for O2 was placed yesterday and has not yet been sent to the DME company. Patient had clear understanding. Nothing further needed.  ?

## 2021-05-20 ENCOUNTER — Other Ambulatory Visit: Payer: Self-pay

## 2021-05-20 ENCOUNTER — Emergency Department
Admission: EM | Admit: 2021-05-20 | Discharge: 2021-05-20 | Disposition: A | Discharge status: Home / Self Care | Payer: Medicare Other | Attending: Emergency Medicine | Admitting: Emergency Medicine

## 2021-05-20 ENCOUNTER — Emergency Department: Payer: Medicare Other

## 2021-05-20 DIAGNOSIS — J441 Chronic obstructive pulmonary disease with (acute) exacerbation: Secondary | ICD-10-CM | POA: Insufficient documentation

## 2021-05-20 DIAGNOSIS — R0602 Shortness of breath: Secondary | ICD-10-CM | POA: Diagnosis present

## 2021-05-20 LAB — BASIC METABOLIC PANEL
Anion gap: 8 (ref 5–15)
BUN: 12 mg/dL (ref 6–20)
CO2: 27 mmol/L (ref 22–32)
Calcium: 9.2 mg/dL (ref 8.9–10.3)
Chloride: 100 mmol/L (ref 98–111)
Creatinine, Ser: 0.81 mg/dL (ref 0.44–1.00)
GFR, Estimated: >60 mL/min (ref >60)
Glucose, Bld: 103 mg/dL — ABNORMAL HIGH (ref 70–99)
Potassium: 3.9 mmol/L (ref 3.5–5.1)
Sodium: 135 mmol/L (ref 135–145)

## 2021-05-20 LAB — CBC
HCT: 42.1 % (ref 36.0–46.0)
Hemoglobin: 13.4 g/dL (ref 12.0–15.0)
MCH: 25.9 pg — ABNORMAL LOW (ref 26.0–34.0)
MCHC: 31.8 g/dL (ref 30.0–36.0)
MCV: 81.3 fL (ref 80.0–100.0)
Platelets: 234 10*3/uL (ref 150–400)
RBC: 5.18 MIL/uL — ABNORMAL HIGH (ref 3.87–5.11)
RDW: 14.9 % (ref 11.5–15.5)
WBC: 8.1 10*3/uL (ref 4.0–10.5)
nRBC: 0 % (ref 0.0–0.2)

## 2021-05-20 LAB — TROPONIN I (HIGH SENSITIVITY): Troponin I (High Sensitivity): 3 ng/L (ref <18)

## 2021-05-20 MED ORDER — PREDNISONE 10 MG PO TABS: ORAL_TABLET | ORAL | 0 refills | Status: DC

## 2021-05-20 NOTE — Discharge Instructions (Signed)
You were seen today for nasal congestion, cough and shortness of breath.  Your chest x-ray does not show any evidence of pneumonia.  Your labs are normal.  Your EKG looked good.  I am putting you on prednisone for the next 6 days.  Please continue your inhalers as prescribed.  Please follow-up with pulmonology if your symptoms persist. ?

## 2021-05-20 NOTE — ED Triage Notes (Addendum)
Pt comes pov with sob/copd. States that this morning she woke up with sob and congestion. Hx of copd. Took a breathing tx at 0645 and coughing without getting it up and without relief. Ambulatory, NAD.  ? ?Pt supposed to getting rx for oxygen at night, but has not arrived at her house yet.  ?

## 2021-05-20 NOTE — ED Provider Notes (Signed)
? ?[facility] ?Provider Note ? ? ? Event Date/Time  ? First MD Initiated Contact with Patient 05/20/21 0901   ?  (approximate) ? ? ?History  ? ?Shortness of Breath ? ? ?HPI ? ?Andrea [name] is a 56 y.o. female with a history of COPD, pulmonary nodules, CHRF with complaint of nasal congestion, cough and shortness of breath.  She reports this started last night.  She is not blowing any mucus out of her nose.  The cough is not productive.  She reports she took a breathing treatment this morning and her symptoms have improved slightly.  She denies headache, eye pain, runny nose, ear pain, sore throat, chest pain, nausea, vomiting, fever, chills or body aches.  She has used her Breztri and lbuterol as prescribed.  She reports she has recently been prescribed oxygen but this has not been delivered to the house yet.  She follows with Dr. Rudi Coco. ? ?  ? ? ?Physical Exam  ? ?Triage Vital Signs: ?ED Triage Vitals  ?Enc Vitals Group  ?   BP 05/20/21 0843 135/69  ?   Pulse Rate 05/20/21 0843 95  ?   Resp 05/20/21 0843 20  ?   Temp 05/20/21 0843 98 ?F (36.7 ?C)  ?   Temp Source 05/20/21 0843 Oral  ?   SpO2 05/20/21 0843 93 %  ?   Weight 05/20/21 0844 155 lb (70.3 kg)  ?   Height 05/20/21 0844 '5\' 3"'$  (1.6 m)  ?   Head Circumference --   ?   Peak Flow --   ?   Pain Score 05/20/21 0844 8  ?   Pain Loc --   ?   Pain Edu? --   ?   Excl. in [place]? --   ? ? ?Most recent vital signs: ?Vitals:  ? 05/20/21 0843  ?BP: 135/69  ?Pulse: 95  ?Resp: 20  ?Temp: 98 ?F (36.7 ?C)  ?SpO2: 93%  ? ? ?General: Awake, no distress ?CV:  RRR, no murmur noted. ?Resp:  Normal effort.  Lungs clear but diminished.  No wheezing, rales or rhonchi noted. ? ? ? ?ED Results / Procedures / Treatments  ? ?Labs ?(all labs ordered are listed, but only abnormal results are displayed) ?Labs Reviewed  ?BASIC METABOLIC PANEL - Abnormal; Notable for the following components:  ?    Result Value  ? Glucose, Bld 103 (*)   ? All other components  within normal limits  ?CBC - Abnormal; Notable for the following components:  ? RBC 5.18 (*)   ? MCH 25.9 (*)   ? All other components within normal limits  ?TROPONIN I (HIGH SENSITIVITY)  ? ? ? ?EKG ? ?ED ECG REPORT ? ? Date: 05/20/2021 ? EKG Time: 9:42 AM ? Rate: 97 ? Rhythm: normal sinus rhythm ? Axis: normal ? Intervals:none ? ST&T Change: none ? Narrative Interpretation: Normal sinus rhythm, right atrial enlargement, no acute findings. ? ? ?RADIOLOGY ?Imaging Orders    ?     DG Chest 2 View    ?IMPRESSION: Chronic hyperinflation and emphysematous change. No acute lung process.  ? ? ? ?IMPRESSION / MDM / ASSESSMENT AND PLAN / ED COURSE  ?I reviewed the triage vital signs and the nursing notes. ? ?Nasal Congestion, Cough, SOB: ? ?Differential diagnosis includes, but is not limited to, acute bronchitis, COPD exacerbation, pneumonia ? ?CBC does not show any evidence of leukocytosis ?BMET does not show any evidence of electrolyte imbalance or kidney injury ?  Troponin normal ?ECG shows NSR, no acute findings ?Chest xray shows hyperinflation but no infiltrate per my read, confirmed by radiology ?RX for Pred Taper x 6 days ?No indication for antibiotics at this time ?She will continue Breztri and Albuterol as previously prescribed ?She will follow up with pulmonology as an outpatient ? ? ? ?FINAL CLINICAL IMPRESSION(S) / ED DIAGNOSES  ? ?Final diagnoses:  ?COPD exacerbation ([place])  ? ? ? ?Rx / DC Orders  ? ?ED Discharge Orders   ? ?      Ordered  ?  predniSONE (DELTASONE) 10 MG tablet       ? 05/20/21 0941  ? ?  ?  ? ?  ? ? ? ?Note:  This document was prepared using Dragon voice recognition software and may include unintentional dictation errors. ? ?  ?Jearld Fenton, NP ?05/20/21 7371 ? ?  ?Naaman Plummer, MD ?05/21/21 [phone] ? ?

## 2021-05-21 ENCOUNTER — Other Ambulatory Visit: Payer: Self-pay

## 2021-05-21 ENCOUNTER — Emergency Department
Admission: EM | Admit: 2021-05-21 | Discharge: 2021-05-21 | Disposition: A | Discharge status: Home / Self Care | Payer: Medicare Other | Attending: Emergency Medicine | Admitting: Emergency Medicine

## 2021-05-21 DIAGNOSIS — R0602 Shortness of breath: Secondary | ICD-10-CM | POA: Diagnosis present

## 2021-05-21 DIAGNOSIS — I10 Essential (primary) hypertension: Secondary | ICD-10-CM | POA: Diagnosis not present

## 2021-05-21 DIAGNOSIS — J441 Chronic obstructive pulmonary disease with (acute) exacerbation: Secondary | ICD-10-CM | POA: Diagnosis not present

## 2021-05-21 LAB — COMPREHENSIVE METABOLIC PANEL
ALT: 13 U/L (ref 0–44)
AST: 15 U/L (ref 15–41)
Albumin: 3.8 g/dL (ref 3.5–5.0)
Alkaline Phosphatase: 85 U/L (ref 38–126)
Anion gap: 10 (ref 5–15)
BUN: 13 mg/dL (ref 6–20)
CO2: 28 mmol/L (ref 22–32)
Calcium: 9 mg/dL (ref 8.9–10.3)
Chloride: 99 mmol/L (ref 98–111)
Creatinine, Ser: 0.78 mg/dL (ref 0.44–1.00)
GFR, Estimated: >60 mL/min (ref >60)
Glucose, Bld: 96 mg/dL (ref 70–99)
Potassium: 4 mmol/L (ref 3.5–5.1)
Sodium: 137 mmol/L (ref 135–145)
Total Bilirubin: 0.3 mg/dL (ref 0.3–1.2)
Total Protein: 8.1 g/dL (ref 6.5–8.1)

## 2021-05-21 LAB — CBC WITH DIFFERENTIAL/PLATELET
Abs Immature Granulocytes: 0.04 10*3/uL (ref 0.00–0.07)
Basophils Absolute: 0.2 10*3/uL — ABNORMAL HIGH (ref 0.0–0.1)
Basophils Relative: 2 %
Eosinophils Absolute: 0.1 10*3/uL (ref 0.0–0.5)
Eosinophils Relative: 1 %
HCT: 43.3 % (ref 36.0–46.0)
Hemoglobin: 13.8 g/dL (ref 12.0–15.0)
Immature Granulocytes: 0 %
Lymphocytes Relative: 19 %
Lymphs Abs: 2 10*3/uL (ref 0.7–4.0)
MCH: 25.9 pg — ABNORMAL LOW (ref 26.0–34.0)
MCHC: 31.9 g/dL (ref 30.0–36.0)
MCV: 81.2 fL (ref 80.0–100.0)
Monocytes Absolute: 1.1 10*3/uL — ABNORMAL HIGH (ref 0.1–1.0)
Monocytes Relative: 11 %
Neutro Abs: 6.9 10*3/uL (ref 1.7–7.7)
Neutrophils Relative %: 67 %
Platelets: 244 10*3/uL (ref 150–400)
RBC: 5.33 MIL/uL — ABNORMAL HIGH (ref 3.87–5.11)
RDW: 15 % (ref 11.5–15.5)
WBC: 10.3 10*3/uL (ref 4.0–10.5)
nRBC: 0 % (ref 0.0–0.2)

## 2021-05-21 LAB — D-DIMER, QUANTITATIVE: D-Dimer, Quant: 0.51 ug/mL-FEU — ABNORMAL HIGH (ref 0.00–0.50)

## 2021-05-21 MED ORDER — IPRATROPIUM-ALBUTEROL 0.5-2.5 (3) MG/3ML IN SOLN
3.0000 mL | Freq: Once | RESPIRATORY_TRACT | Status: AC
  Administered 2021-05-21: 3 mL via RESPIRATORY_TRACT
  Filled 2021-05-21: qty 3

## 2021-05-21 MED ORDER — AZITHROMYCIN 500 MG PO TABS
500.0000 mg | ORAL_TABLET | Freq: Every day | ORAL | Status: DC
  Administered 2021-05-21: 500 mg via ORAL
  Filled 2021-05-21: qty 1

## 2021-05-21 MED ORDER — AZITHROMYCIN 500 MG PO TABS: 500.0000 mg | ORAL_TABLET | Freq: Every day | ORAL | 0 refills | Status: AC

## 2021-05-21 MED ORDER — PREDNISONE 20 MG PO TABS
60.0000 mg | ORAL_TABLET | Freq: Once | ORAL | Status: AC
  Administered 2021-05-21: 60 mg via ORAL
  Filled 2021-05-21: qty 3

## 2021-05-21 NOTE — ED Provider Notes (Signed)
? ?[facility] ?Provider Note ? ? ? Event Date/Time  ? First MD Initiated Contact with Patient 05/21/21 1503   ?  (approximate) ? ? ?History  ? ?Chief Complaint ?Shortness of Breath ? ? ?HPI ?Andrea [name] is a 56 y.o. female, history of depression, GERD, COPD, hypertension, hyperlipidemia, panic disorder, lupus, presents to the emergency department for evaluation of shortness of breath.  Patient states that she has been experiencing shortness of breath for the past 3 days.  She was seen here in the emergency department yesterday, where she was diagnosed with COPD exacerbation and given steroids.  However, the patient states that she has not had any relief in her symptoms and feels pain along the left side and right side of her chest.  Denies fever/chills, abdominal pain, nausea/vomiting, headache, orthopnea, leg swelling, numbness/tingling in upper or lower extremities, or rashes. ? ?History Limitations: No limitations. ? ?  ? ? ?Physical Exam  ?Triage Vital Signs: ?ED Triage Vitals  ?Enc Vitals Group  ?   BP 05/21/21 1405 124/80  ?   Pulse Rate 05/21/21 1405 (!) 101  ?   Resp 05/21/21 1405 14  ?   Temp 05/21/21 1405 98.5 ?F (36.9 ?C)  ?   Temp Source 05/21/21 1405 Oral  ?   SpO2 05/21/21 1405 93 %  ?   Weight 05/21/21 1408 154 lb 15.7 oz (70.3 kg)  ?   Height 05/21/21 1408 '5\' 3"'$  (1.6 m)  ?   Head Circumference --   ?   Peak Flow --   ?   Pain Score 05/21/21 1408 8  ?   Pain Loc --   ?   Pain Edu? --   ?   Excl. in [place]? --   ? ? ?Most recent vital signs: ?Vitals:  ? 05/21/21 1600 05/21/21 1700  ?BP: 129/70 120/71  ?Pulse: 91 90  ?Resp: 20 20  ?Temp:    ?SpO2: 95% 97%  ? ? ?General: Awake, NAD.  ?Skin: Warm, dry.  ?CV: Good peripheral perfusion.  ?Resp: Normal effort.  Mild expiratory wheezing in the upper and lower lobes. ?Abd: Soft, non-tender. No distention.  ?Neuro: At baseline. No gross neurological deficits.  ?Other: Not applicable. ? ?Physical Exam ? ? ? ?ED Results / Procedures /  Treatments  ?Labs ?(all labs ordered are listed, but only abnormal results are displayed) ?Labs Reviewed  ?CBC WITH DIFFERENTIAL/PLATELET - Abnormal; Notable for the following components:  ?    Result Value  ? RBC 5.33 (*)   ? MCH 25.9 (*)   ? Monocytes Absolute 1.1 (*)   ? Basophils Absolute 0.2 (*)   ? All other components within normal limits  ?D-DIMER, QUANTITATIVE - Abnormal; Notable for the following components:  ? D-Dimer, Quant 0.51 (*)   ? All other components within normal limits  ?COMPREHENSIVE METABOLIC PANEL  ? ? ? ?EKG ?Sinus rhythm, rate of 96, no T-segment changes, no axis deviations, no AV blocks, normal QRS interval. ? ? ?RADIOLOGY ? ?ED Provider Interpretation: Not applicable. ? ?No results found. ? ?PROCEDURES: ? ?Critical Care performed: Not applicable. ? ?Procedures ? ? ? ?MEDICATIONS ORDERED IN ED: ?Medications  ?azithromycin (ZITHROMAX) tablet 500 mg (500 mg Oral Given 05/21/21 1657)  ?ipratropium-albuterol (DUONEB) 0.5-2.5 (3) MG/3ML nebulizer solution 3 mL (3 mLs Nebulization Given 05/21/21 1558)  ?predniSONE (DELTASONE) tablet 60 mg (60 mg Oral Given 05/21/21 1656)  ?ipratropium-albuterol (DUONEB) 0.5-2.5 (3) MG/3ML nebulizer solution 3 mL (3 mLs Nebulization Given 05/21/21  1657)  ? ? ? ?IMPRESSION / MDM / ASSESSMENT AND PLAN / ED COURSE  ?I reviewed the triage vital signs and the nursing notes. ?             ?               ? ? ?Differential diagnosis includes, but is not limited to, COPD exacerbation, bronchitis, pneumonia. ? ?ED Course ?Patient appears well.  Vital signs within normal limits.  NAD.  Lung sounds show wheezing evident bilaterally.  We will go ahead treat with DuoNeb. ? ?CBC shows no evidence of leukocytosis or anemia.  CMP shows no evidence of transaminitis, electrolyte abnormalities, or kidney injury. ? ?Age-adjusted D-dimer negative. ? ?Assessment/Plan ?Presentation consistent with COPD exacerbation.  Chest x-ray from yesterday shows no evidence of pneumonia.  She appears  stable at this time.  No endorsement of fevers.  Age-adjusted D-dimer negative, unlikely PE.  Unlikely COVID-19 or influenza.  After 2 DuoNeb treatments, she states that she feels much better.  We will go ahead and treat with 60 mg prednisone and first dose of azithromycin here.  Will plan to discharge her with primary care follow-up and a prescription for continued azithromycin therapy.  ? ?Considered admission for this patient, but given her response to therapy, stable vitals, and unremarkable work-up, she is unlikely to benefit. ? ?Patient was provided with anticipatory guidance, return precautions, and educational material. Encouraged the patient to return to the emergency department at any time if they begin to experience any new or worsening symptoms.  ? ?  ? ? ?FINAL CLINICAL IMPRESSION(S) / ED DIAGNOSES  ? ?Final diagnoses:  ?COPD exacerbation ([place])  ? ? ? ?Rx / DC Orders  ? ?ED Discharge Orders   ? ?      Ordered  ?  azithromycin (ZITHROMAX) 500 MG tablet  Daily       ? 05/21/21 1704  ? ?  ?  ? ?  ? ? ? ?Note:  This document was prepared using Dragon voice recognition software and may include unintentional dictation errors. ?  ?Teodoro Spray, [place] ?05/21/21 1759 ? ?  ?Rada Hay, MD ?05/21/21 2334 ? ?  ?Rada Hay, MD ?05/22/21 0004 ? ?

## 2021-05-21 NOTE — Discharge Instructions (Addendum)
-  Take all of your antibiotics as prescribed.  Continue taking your prednisone as prescribed previously. ?-Follow-up with your primary care provider in 2 days if your symptoms persist. ?-Return to the emergency department at any time if you begin to experience any new or worsening symptoms. ?

## 2021-05-21 NOTE — ED Triage Notes (Signed)
Patient to ER via Pov with complaints of worsening SHOB/ COPD. x3 days. Reports she was seen here yesterday and was given steroids with no relief in symptoms. Reports today she "hurts in my lungs" and has worsening shortness of breath with congestion.  ?

## 2021-05-22 ENCOUNTER — Telehealth: Payer: Self-pay | Admitting: Pulmonary Disease

## 2021-05-22 NOTE — Telephone Encounter (Signed)
Spoke to patient.  ?She stated that [facility] attempted to delivery oxygen but she was not at home at that time.  ?She left a message for [facility] to reschedule delivery.  ?Nothing further needed.  ? ?

## 2021-05-25 ENCOUNTER — Other Ambulatory Visit (HOSPITAL_COMMUNITY): Payer: Self-pay

## 2021-05-28 ENCOUNTER — Telehealth: Payer: Self-pay | Admitting: Pulmonary Disease

## 2021-05-28 MED ORDER — LEVALBUTEROL TARTRATE 45 MCG/ACT IN AERO: 2.0000 | INHALATION_SPRAY | RESPIRATORY_TRACT | 11 refills | Status: DC | PRN

## 2021-05-28 NOTE — Telephone Encounter (Signed)
Per Dr Patsey Berthold states we can call in Xopenex, Patsey Berthold states pt does not need another round of treatment at this time. ? ?Xopenex sent into preferred pharmacy . Nothing further needed.  ?

## 2021-05-28 NOTE — Telephone Encounter (Signed)
Patient states that due to the shortage of albuterol, the pharmacy is only giving albuterol to patients that have a doctors note that explains why she needs the prescription. ? ?Patient also finished her zpak that was prescribed at the hospital on 05/21/2021. Patient is still have symptoms and would like to know if something else should be called in or if another round is needed. ? ?Please advise. ? ? ?

## 2021-05-28 NOTE — Telephone Encounter (Signed)
Fax has been received. Placed in Dr Domingo Dimes folder.  ?

## 2021-05-29 ENCOUNTER — Telehealth: Payer: Self-pay

## 2021-05-29 MED ORDER — DEXLANSOPRAZOLE 60 MG PO CPDR: 60.0000 mg | DELAYED_RELEASE_CAPSULE | Freq: Every day | ORAL | 3 refills | Status: DC

## 2021-05-29 NOTE — Telephone Encounter (Signed)
Patient left voicemail and states the Dexlant is approved by her insurance and she would like this medication sent to her pharmacy  ?

## 2021-05-29 NOTE — Telephone Encounter (Signed)
Left message on voicemail.

## 2021-05-31 ENCOUNTER — Encounter: Payer: Self-pay | Admitting: Medical

## 2021-05-31 ENCOUNTER — Encounter: Payer: Self-pay | Admitting: Pulmonary Disease

## 2021-05-31 ENCOUNTER — Ambulatory Visit (INDEPENDENT_AMBULATORY_CARE_PROVIDER_SITE_OTHER): Payer: Medicare Other | Admitting: Medical

## 2021-05-31 ENCOUNTER — Telehealth: Payer: Self-pay | Admitting: Pulmonary Disease

## 2021-05-31 VITALS — BP 112/76 | HR 83 | Ht 64.0 in | Wt 148.0 lb

## 2021-05-31 DIAGNOSIS — I493 Ventricular premature depolarization: Secondary | ICD-10-CM

## 2021-05-31 DIAGNOSIS — R0609 Other forms of dyspnea: Secondary | ICD-10-CM

## 2021-05-31 DIAGNOSIS — J449 Chronic obstructive pulmonary disease, unspecified: Secondary | ICD-10-CM | POA: Diagnosis not present

## 2021-05-31 DIAGNOSIS — Z720 Tobacco use: Secondary | ICD-10-CM | POA: Diagnosis not present

## 2021-05-31 MED ORDER — HYDROCHLOROTHIAZIDE 12.5 MG PO CAPS: 12.5000 mg | ORAL_CAPSULE | Freq: Every day | ORAL | 3 refills | Status: DC

## 2021-05-31 NOTE — Telephone Encounter (Signed)
Called and spoke to patient about Dr Domingo Dimes recommendation. Patient had a clear understanding. Nothing further needed.  ?

## 2021-05-31 NOTE — Progress Notes (Signed)
?Cardiology Office Note:   ? ?Date:  05/31/2021  ? ?ID:  Andrea [name], DOB May 11, 1965, MRN [id number] ? ?PCP:  Birdie Sons, MD  ?[facility] Cardiologist:  None  ?[place] Electrophysiologist:  None  ? ?Referring MD: Birdie Sons, MD  ? ?Chief Complaint: 12 month follow-up ? ?History of Present Illness:   ? ?Andrea [name] is a 56 y.o. female with a hx of symptomatic PVCs, minor nonobstructive CAD, tobacco use, HTN, anxiety.  ? ?She has been seen by Dr. Caryl Comes for symptomatic PVCs and tried multiple antiarrythmic medications with no improvement. She was referred to Dr. Rayann Heman for ablation but it was felt success was low.  ? ?Last seen 06/2019 and was doing reasonably well.  ? ?Today the patient reports 1 month of shortness of breath. She has been following with pulmonology and they feel some of the breathing problems may be cardiac in nature. She has dyspnea on exertion and orthopnea. No LLE. Also reports rare sharp upper epigastric pain. She has occasional palpitations. She smokes less than 1/2 ppd. Does not have sleep apnea however needs 1L O2 at night.  ? ?Past Medical History:  ?Diagnosis Date  ? Anxiety   ? panic attacks, chest pain  ? Arthritis   ? COPD (chronic obstructive pulmonary disease) ([place])   ? GERD (gastroesophageal reflux disease)   ? Hypertension   ? not medicated  ? Non-obstructive CAD in native artery   ? a. cardiac cath 01/2014: showed minor irregularities, mildly elevated left ventricular end-diastolic pressure and normal ejection fraction; b. 03/2015 MV: low risk.  ? Palpitations   ? a. 08/2016 Event Monitor: occas PVC's, two brief runs of SVT (4 and 7 beats).  ? PONV (postoperative nausea and vomiting)   ? Symptomatic PVC's (premature ventricular contractions)   ? Tobacco abuse   ? Wears dentures   ? partial upper and lower  ? ? ?Past Surgical History:  ?Procedure Laterality Date  ? CARDIAC CATHETERIZATION  01/03/14  ? CARDIAC CATHETERIZATION    ? CHOLECYSTECTOMY    ?  COLONOSCOPY WITH PROPOFOL N/A 10/05/2019  ? Procedure: COLONOSCOPY WITH PROPOFOL;  Surgeon: Lucilla Lame, MD;  Location: [facility] ENDOSCOPY;  Service: Endoscopy;  Laterality: N/A;  ? ESOPHAGOGASTRODUODENOSCOPY (EGD) WITH PROPOFOL N/A 07/11/2016  ? Procedure: ESOPHAGOGASTRODUODENOSCOPY (EGD) WITH PROPOFOL;  Surgeon: Lucilla Lame, MD;  Location: [place];  Service: Endoscopy;  Laterality: N/A;  ? ESOPHAGOGASTRODUODENOSCOPY (EGD) WITH PROPOFOL N/A 10/05/2019  ? Procedure: ESOPHAGOGASTRODUODENOSCOPY (EGD) WITH PROPOFOL;  Surgeon: Lucilla Lame, MD;  Location: [facility] ENDOSCOPY;  Service: Endoscopy;  Laterality: N/A;  ? EYE SURGERY    ? IMAGE GUIDED SINUS SURGERY N/A 11/30/2014  ? Procedure: IMAGE GUIDED SINUS SURGERY;  Surgeon: Carloyn Manner, MD;  Location: [place];  Service: ENT;  Laterality: N/A;  GAVE DISK TO CE CE  ? MAXILLARY ANTROSTOMY Bilateral 11/30/2014  ? Procedure: MAXILLARY ANTROSTOMY;  Surgeon: Carloyn Manner, MD;  Location: [place];  Service: ENT;  Laterality: Bilateral;  ? SEPTOPLASTY N/A 11/30/2014  ? Procedure: SEPTOPLASTY;  Surgeon: Carloyn Manner, MD;  Location: [place];  Service: ENT;  Laterality: N/A;  ? SPHENOIDECTOMY Left 11/30/2014  ? Procedure: SPHENOIDECTOMY, ;  Surgeon: Carloyn Manner, MD;  Location: [place];  Service: ENT;  Laterality: Left;  ? [place]  ? vaginal  ? ? ?Current Medications: ?Current Meds  ?Medication Sig  ? albuterol (PROVENTIL) (2.5 MG/3ML) 0.083% nebulizer solution USE 1 VIAL IN NEBULIZER EVERY 4 (  FOUR) HOURS AS NEEDED FOR WHEEZING OR SHORTNESS OF BREATH.  ? ALPRAZolam (XANAX) 0.5 MG tablet Take 1 tablet (0.5 mg total) by mouth every 8 (eight) hours as needed. May take between doses of clonazepam as needed.  ? Budeson-Glycopyrrol-Formoterol (BREZTRI AEROSPHERE) 160-9-4.8 MCG/ACT AERO Inhale 2 puffs into the lungs in the morning and at bedtime.  ? clonazePAM (KLONOPIN) 1 MG tablet TAKE 1 TABLET BY MOUTH 2  TIMES DAILY AS NEEDED.  ? dexlansoprazole (DEXILANT) 60 MG capsule Take 1 capsule (60 mg total) by mouth daily.  ? hydrochlorothiazide (MICROZIDE) 12.5 MG capsule Take 1 capsule (12.5 mg total) by mouth daily.  ? hydroxychloroquine (PLAQUENIL) 200 MG tablet Take 2 tablets (400 mg total) by mouth daily for Discoid Lupus (L93.0)  ? levalbuterol (XOPENEX HFA) 45 MCG/ACT inhaler Inhale 2 puffs into the lungs every 4 (four) hours as needed for wheezing.  ? Misc. Devices (PULSE OXIMETER DELUXE) MISC Use daily to monitor oxygen levels  ? Nebulizers (COMPRESSOR/NEBULIZER) MISC Supplies only  ? Nebulizers (COMPRESSOR/NEBULIZER) MISC Use as directed  ? nitroGLYCERIN (NITROSTAT) 0.4 MG SL tablet Place 1 tablet (0.4 mg total) under the tongue every 5 (five) minutes as needed for chest pain.  ? OXYGEN Inhale 1 L into the lungs at bedtime.  ? pravastatin (PRAVACHOL) 20 MG tablet Take 1 tablet (20 mg total) by mouth daily.  ? Respiratory Therapy Supplies (FLUTTER) DEVI 1 Device by Does not apply route daily.  ? Spacer/Aero-Holding Chambers (AEROCHAMBER MV) inhaler Use as instructed  ?  ? ?Allergies:   Alum & mag hydroxide-simeth, Aspirin, Bactrim [sulfamethoxazole-trimethoprim], Cefdinir, Chantix [varenicline], Doxycycline, Multaq [dronedarone], and Ivp dye [iodinated contrast media]  ? ?Social History  ? ?Socioeconomic History  ? Marital status: Married  ?  Spouse name: Not on file  ? Number of children: 3  ? Years of education: Not on file  ? Highest education level: GED or equivalent  ?Occupational History  ? Occupation: Disabled  ?  Comment: Social Security as of 05/21/2011  ?Tobacco Use  ? Smoking status: Every Day  ?  Packs/day: 1.00  ?  Years: 20.00  ?  Pack years: 20.00  ?  Types: Cigarettes  ? Smokeless tobacco: Never  ? Tobacco comments:  ?  0.25 ppd - 05/31/2021  ?Vaping Use  ? Vaping Use: Never used  ?Substance and Sexual Activity  ? Alcohol use: No  ?  Alcohol/week: 0.0 standard drinks  ? Drug use: No  ? Sexual  activity: Not Currently  ?Other Topics Concern  ? Not on file  ?Social History Narrative  ? Lives at home with husband. Independent at baseline.  ? ?Social Determinants of Health  ? ?Financial Resource Strain: Medium Risk  ? Difficulty of Paying Living Expenses: Somewhat hard  ?Food Insecurity: No Food Insecurity  ? Worried About Charity fundraiser in the Last Year: Never true  ? Ran Out of Food in the Last Year: Never true  ?Transportation Needs: No Transportation Needs  ? Lack of Transportation (Medical): No  ? Lack of Transportation (Non-Medical): No  ?Physical Activity: Insufficiently Active  ? Days of Exercise per Week: 2 days  ? Minutes of Exercise per Session: 20 min  ?Stress: Stress Concern Present  ? Feeling of Stress : To some extent  ?Social Connections: Moderately Isolated  ? Frequency of Communication with Friends and Family: More than three times a week  ? Frequency of Social Gatherings with Friends and Family: Once a week  ? Attends Religious Services: Never  ?  Active Member of Clubs or Organizations: No  ? Attends Archivist Meetings: Never  ? Marital Status: Married  ?  ? ?Family History: ?The patient's family history includes Arthritis in her father; Asthma in her father; Colon cancer in her father; Emphysema in her father; Heart attack in her father; Heart disease in her father; Hypertension in her mother. There is no history of Breast cancer. ? ?ROS:   ?Please see the history of present illness.    ? All other systems reviewed and are negative. ? ?EKGs/Labs/Other Studies Reviewed:   ? ?The following studies were reviewed today: ? ?Echo 2018 ?Study Conclusions  ? ?- Left ventricle: The cavity size was normal. Systolic function was  ?  normal. The estimated ejection fraction was in the range of 60%  ?  to 65%. Wall motion was normal; there were no regional wall  ?  motion abnormalities. Left ventricular diastolic function  ?  parameters were normal.  ?- Left atrium: The atrium was normal  in size.  ?- Right ventricle: Systolic function was normal.  ?- Pulmonary arteries: Systolic pressure was within the normal  ?  range.  ? ?Impressions:  ? ?- Normal study.  ? ?EKG:  EKG is  ordered today.  The ekg ord

## 2021-05-31 NOTE — Telephone Encounter (Signed)
Dr Patsey Berthold please advise :  ? ? ?Patient states she still feels unwell.  ?Still hoarse, still struggling. Was told to call back if not better in a couple days ?

## 2021-05-31 NOTE — Patient Instructions (Signed)
Medication Instructions:  ?Your physician has recommended you make the following change in your medication:  ? ?START Hydrochlorothiazide 12.5 mg daily. An Rx has been sent to your pharmacy. ?  ? ?*If you need a refill on your cardiac medications before your next appointment, please call your pharmacy* ? ? ?Lab Work: ?Your physician recommends that you return for lab work (Bmp) in: 1-2 weeks ? ?If you have labs (blood work) drawn today and your tests are completely normal, you will receive your results only by: ?MyChart Message (if you have MyChart) OR ?A paper copy in the mail ?If you have any lab test that is abnormal or we need to change your treatment, we will call you to review the results. ? ? ?Testing/Procedures: ?Your physician has requested that you have an echocardiogram. Echocardiography is a painless test that uses sound waves to create images of your heart. It provides your doctor with information about the size and shape of your heart and how well your heart?s chambers and valves are working. This procedure takes approximately one hour. There are no restrictions for this procedure. ? ? ? ?Follow-Up: ?At [facility], you and your health needs are our priority.  As part of our continuing mission to provide you with exceptional heart care, we have created designated Provider Care Teams.  These Care Teams include your primary Cardiologist (physician) and Advanced Practice Providers (APPs -  Physician Assistants and Nurse Practitioners) who all work together to provide you with the care you need, when you need it. ? ?We recommend signing up for the patient portal called "MyChart".  Sign up information is provided on this After Visit Summary.  MyChart is used to connect with patients for Virtual Visits (Telemedicine).  Patients are able to view lab/test results, encounter notes, upcoming appointments, etc.  Non-urgent messages can be sent to your provider as well.   ?To learn more about what you can do  with MyChart, go to [email or web address].   ? ?Your next appointment:   ?4 week(s) ? ?The format for your next appointment:   ?In Person ? ?Provider:   ?You may see Kathlyn Sacramento, MD or one of the following Advanced Practice Providers on your designated Care Team:   ?Murray Hodgkins, NP ?Christell Faith, PA-C ?Cadence Kathlen Mody, PA-C ? ? ?Other Instructions ? ?Echocardiogram ?An echocardiogram is a test that uses sound waves (ultrasound) to produce images of the heart. ?Images from an echocardiogram can provide important information about: ?Heart size and shape. ?The size and thickness and movement of your heart's walls. ?Heart muscle function and strength. ?Heart valve function or if you have stenosis. Stenosis is when the heart valves are too narrow. ?If blood is flowing backward through the heart valves (regurgitation). ?A tumor or infectious growth around the heart valves. ?Areas of heart muscle that are not working well because of poor blood flow or injury from a heart attack. ?Aneurysm detection. An aneurysm is a weak or damaged part of an artery wall. The wall bulges out from the normal force of blood pumping through the body. ?Tell a health care provider about: ?Any allergies you have. ?All medicines you are taking, including vitamins, herbs, eye drops, creams, and over-the-counter medicines. ?Any blood disorders you have. ?Any surgeries you have had. ?Any medical conditions you have. ?Whether you are pregnant or may be pregnant. ?What are the risks? ?Generally, this is a safe test. However, problems may occur, including an allergic reaction to dye (contrast) that may be used during the  test. ?What happens before the test? ?No specific preparation is needed. You may eat and drink normally. ?What happens during the test? ? ?You will take off your clothes from the waist up and put on a hospital gown. ?Electrodes or electrocardiogram (ECG)patches may be placed on your chest. The electrodes or patches are then  connected to a device that monitors your heart rate and rhythm. ?You will lie down on a table for an ultrasound exam. A gel will be applied to your chest to help sound waves pass through your skin. ?A handheld device, called a transducer, will be pressed against your chest and moved over your heart. The transducer produces sound waves that travel to your heart and bounce back (or "echo" back) to the transducer. These sound waves will be captured in real-time and changed into images of your heart that can be viewed on a video monitor. The images will be recorded on a computer and reviewed by your health care provider. ?You may be asked to change positions or hold your breath for a short time. This makes it easier to get different views or better views of your heart. ?In some cases, you may receive an image enhancer through an IV in one of your veins. This can improve the quality of the pictures from your heart. ?The procedure may vary among health care providers and hospitals. ?What can I expect after the test? ?You may return to your normal, everyday life, including diet, activities, and medicines, unless your health care provider tells you not to do that. ?Follow these instructions at home: ?It is up to you to get the results of your test. Ask your health care provider, or the department that is doing the test, when your results will be ready. ?Keep all follow-up visits. This is important. ?Summary ?An echocardiogram is a test that uses sound waves (ultrasound) to produce images of the heart. ?Images from an echocardiogram can provide important information about the size and shape of your heart, heart muscle function, heart valve function, and other possible heart problems. ?You do not need to do anything to prepare before this test. You may eat and drink normally. ?After the echocardiogram is completed, you may return to your normal, everyday life, unless your health care provider tells you not to do that. ?This  information is not intended to replace advice given to you by your health care provider. Make sure you discuss any questions you have with your health care provider. ?Document Revised: 11/01/2020 Document Reviewed: 10/12/2019 ?Elsevier Patient Education ? 2022 [place]. ? ? ?

## 2021-05-31 NOTE — Telephone Encounter (Signed)
Likely that this is noninfectious/COPD exacerbation.  I see that cardiology started a diuretic today lets see how that does.  If she is rinsing well after using Breztri.  She needs to quit smoking altogether.  Take Mucinex twice a day if she feels congested. ?

## 2021-06-01 ENCOUNTER — Telehealth: Payer: Self-pay | Admitting: Pulmonary Disease

## 2021-06-01 ENCOUNTER — Other Ambulatory Visit (HOSPITAL_COMMUNITY): Payer: Self-pay

## 2021-06-01 NOTE — Telephone Encounter (Signed)
Spoke to [place] with [facility] and verified that no PA is needed for Xopenex. It is ready for pick up with a 53 dollar co pay.  ?I called patient and provided her with the above information. She stated that she spoke with insurance today and was advised that her new plan goes in to affect tomorrow. With the new plan Xopenex will require a PA. ?She would like for our office to start PA on 06/04/2021. ? ?Will hold message to ensure f/u. ? ?

## 2021-06-01 NOTE — Telephone Encounter (Signed)
Patient is aware of below message and voiced his understanding.  Nothing further needed.   

## 2021-06-01 NOTE — Telephone Encounter (Signed)
Pa team, please advise. Thanks ?

## 2021-06-02 ENCOUNTER — Telehealth: Payer: Medicare HMO | Admitting: Nurse Practitioner

## 2021-06-02 DIAGNOSIS — B9689 Other specified bacterial agents as the cause of diseases classified elsewhere: Secondary | ICD-10-CM

## 2021-06-02 DIAGNOSIS — J019 Acute sinusitis, unspecified: Secondary | ICD-10-CM | POA: Diagnosis not present

## 2021-06-02 MED ORDER — AZITHROMYCIN 250 MG PO TABS: ORAL_TABLET | ORAL | 0 refills | Status: AC

## 2021-06-02 NOTE — Progress Notes (Signed)
E-Visit for Sinus Problems ? ?We are sorry that you are not feeling well.  Here is how we plan to help! ? ?Based on what you have shared with me it looks like you have sinusitis.  Sinusitis is inflammation and infection in the sinus cavities of the head.  Based on your presentation I believe you most likely have Acute Bacterial Sinusitis.  This is an infection caused by bacteria and is treated with antibiotics. ?  ?I have prescribed zpak as you have requested although zpaks are typically used for bronchitis and not sinusitis. I do not recommend another round of prednisone this early since you recently had a prescription for this 2 weeks ago. ? ?You may use an oral decongestant such as Mucinex D or if you have glaucoma or high blood pressure use plain Mucinex. Saline nasal spray help and can safely be used as often as needed for congestion.  If you develop worsening sinus pain, fever or notice severe headache and vision changes, or if symptoms are not better after completion of antibiotic, please schedule an appointment with a health care provider.   ? ?Sinus infections are not as easily transmitted as other respiratory infection, however we still recommend that you avoid close contact with loved ones, especially the very young and elderly.  Remember to wash your hands thoroughly throughout the day as this is the number one way to prevent the spread of infection! ? ?Home Care: ?Only take medications as instructed by your medical team. ?Complete the entire course of an antibiotic. ?Do not take these medications with alcohol. ?A steam or ultrasonic humidifier can help congestion.  You can place a towel over your head and breathe in the steam from hot water coming from a faucet. ?Avoid close contacts especially the very young and the elderly. ?Cover your mouth when you cough or sneeze. ?Always remember to wash your hands. ? ?Get Help Right Away If: ?You develop worsening fever or sinus pain. ?You develop a severe head  ache or visual changes. ?Your symptoms persist after you have completed your treatment plan. ? ?Make sure you ?Understand these instructions. ?Will watch your condition. ?Will get help right away if you are not doing well or get worse. ? ?Thank you for choosing an e-visit. ? ?Your e-visit answers were reviewed by a board certified advanced clinical practitioner to complete your personal care plan. Depending upon the condition, your plan could have included both over the counter or prescription medications. ? ?Please review your pharmacy choice. Make sure the pharmacy is open so you can pick up prescription now. If there is a problem, you may contact your provider through [employer] and have the prescription routed to another pharmacy.  Your safety is important to [place]. If you have drug allergies check your prescription carefully.  ? ?For the next 24 hours you can use MyChart to ask questions about today's visit, request a non-urgent call back, or ask for a work or school excuse. ?You will get an email in the next two days asking about your experience. I hope that your e-visit has been valuable and will speed your recovery.  ?

## 2021-06-02 NOTE — Progress Notes (Signed)
I have spent 5 minutes in review of e-visit questionnaire, review and updating patient chart, medical decision making and response to patient.  ° °Neisha Hinger W Kymia Simi, NP ° °  °

## 2021-06-04 ENCOUNTER — Encounter: Payer: Self-pay | Admitting: Pulmonary Disease

## 2021-06-04 ENCOUNTER — Encounter: Payer: Self-pay | Admitting: Gastroenterology

## 2021-06-04 ENCOUNTER — Other Ambulatory Visit (HOSPITAL_COMMUNITY): Payer: Self-pay

## 2021-06-04 NOTE — Telephone Encounter (Signed)
please see 06/04/2021 mychart message. ? ?Member FU:[id number] ?Rx [id number] ?Rx [id number] ?Rx [id number] ? ? ? ? ? ?

## 2021-06-04 NOTE — Telephone Encounter (Signed)
Will leave encounter open to ensure f/u. ? ?

## 2021-06-04 NOTE — Telephone Encounter (Signed)
PA team, please advise.  ? ?

## 2021-06-04 NOTE — Telephone Encounter (Signed)
Spoke to patient.  ?She stated that she would send a copy of her insurance card via mychart.  ? ?

## 2021-06-05 ENCOUNTER — Other Ambulatory Visit: Payer: Self-pay

## 2021-06-05 DIAGNOSIS — Z122 Encounter for screening for malignant neoplasm of respiratory organs: Secondary | ICD-10-CM

## 2021-06-05 DIAGNOSIS — Z87891 Personal history of nicotine dependence: Secondary | ICD-10-CM

## 2021-06-05 DIAGNOSIS — F17210 Nicotine dependence, cigarettes, uncomplicated: Secondary | ICD-10-CM

## 2021-06-05 NOTE — Telephone Encounter (Signed)
Someone already submitted this PA.  Will wait for the determination. ?

## 2021-06-10 ENCOUNTER — Other Ambulatory Visit: Payer: Self-pay | Admitting: Gastroenterology

## 2021-06-11 ENCOUNTER — Ambulatory Visit (INDEPENDENT_AMBULATORY_CARE_PROVIDER_SITE_OTHER): Payer: Medicare HMO

## 2021-06-11 ENCOUNTER — Other Ambulatory Visit (INDEPENDENT_AMBULATORY_CARE_PROVIDER_SITE_OTHER): Payer: Medicare HMO

## 2021-06-11 DIAGNOSIS — R0609 Other forms of dyspnea: Secondary | ICD-10-CM | POA: Diagnosis not present

## 2021-06-11 LAB — ECHOCARDIOGRAM COMPLETE
AR max vel: 2.47 cm2
AV Area VTI: 2.47 cm2
AV Area mean vel: 2.41 cm2
AV Mean grad: 4 mmHg
AV Peak grad: 8 mmHg
Ao pk vel: 1.41 m/s
Area-P 1/2: 3.36 cm2
Calc EF: 69.5 %
S' Lateral: 2.6 cm
Single Plane A2C EF: 66.4 %
Single Plane A4C EF: 72.9 %

## 2021-06-12 LAB — BASIC METABOLIC PANEL
BUN/Creatinine Ratio: 22 (ref 9–23)
BUN: 17 mg/dL (ref 6–24)
CO2: 26 mmol/L (ref 20–29)
Calcium: 9.9 mg/dL (ref 8.7–10.2)
Chloride: 97 mmol/L (ref 96–106)
Creatinine, Ser: 0.78 mg/dL (ref 0.57–1.00)
Glucose: 102 mg/dL — ABNORMAL HIGH (ref 70–99)
Potassium: 4.1 mmol/L (ref 3.5–5.2)
Sodium: 139 mmol/L (ref 134–144)
eGFR: 89 mL/min/{1.73_m2} (ref >59)

## 2021-06-14 ENCOUNTER — Telehealth: Payer: Self-pay | Admitting: Pulmonary Disease

## 2021-06-14 ENCOUNTER — Ambulatory Visit: Payer: Medicare Other | Admitting: Pulmonary Disease

## 2021-06-14 ENCOUNTER — Telehealth: Payer: Self-pay | Admitting: Gastroenterology

## 2021-06-14 NOTE — Telephone Encounter (Signed)
This is already being worked on ?

## 2021-06-14 NOTE — Telephone Encounter (Signed)
Pt is calling regarding dexilant rx, this is needing prior auth for 60 mg tabs, pt advises direct phone # [phone] ?

## 2021-06-14 NOTE — Telephone Encounter (Signed)
ATC x1.  No answer.  No VM.

## 2021-06-15 NOTE — Telephone Encounter (Signed)
Called and spoke with patient, she is currently using Breztri as her maintenance inhaler and Albuterol as her rescue.  She is about to be out of her Albuterol and she was told she had to switch to [facility] because Albuterol was not covered.  She needs for [place] to contact the appeals department to let them know that she is not on the Albuterol so she can get the Levalbuterol approved.  I advised I would call and take care of it for her. ? ?Called [phone]  [facility]) to start an appeal.  I spoke with Elmyra Ricks, I provided the ICD code of J44.9 and verified that the Albuterol has been stopped and she will be using the Levalbuterol.  The appeal was expedited and we should have a decision in 72 hours.  Reference # is [id number].  The fax # for any records is [phone], attention appeals. ?

## 2021-06-15 NOTE — Telephone Encounter (Signed)
I spoke to Andrea [name] with Andrea [name] appeals to initiate the appeal. Notes faxed to [phone] as requested  ? ?I called pt and lmovm letting her know that we are waiting for the appeals process ?

## 2021-06-18 NOTE — Telephone Encounter (Signed)
Levalbuterol has been approved.  ? ?Lm for patient.  ? ?

## 2021-06-18 NOTE — Telephone Encounter (Signed)
Received fax from [facility] stating that the appeal was approved  ?I called [facility] and confirmed that it did go through ?Left message on voicemail ?Msg sent via mychart  ?

## 2021-06-26 ENCOUNTER — Encounter: Payer: Medicare Other | Admitting: Acute Care

## 2021-06-27 ENCOUNTER — Ambulatory Visit: Payer: Medicare Other

## 2021-07-03 ENCOUNTER — Ambulatory Visit: Payer: Medicare HMO | Admitting: Medical

## 2021-07-11 ENCOUNTER — Telehealth: Payer: 59 | Admitting: Physician Assistant

## 2021-07-11 DIAGNOSIS — B9689 Other specified bacterial agents as the cause of diseases classified elsewhere: Secondary | ICD-10-CM | POA: Diagnosis not present

## 2021-07-11 DIAGNOSIS — J019 Acute sinusitis, unspecified: Secondary | ICD-10-CM

## 2021-07-11 MED ORDER — PREDNISONE 10 MG (21) PO TBPK: ORAL_TABLET | ORAL | 0 refills | Status: DC

## 2021-07-11 MED ORDER — AMOXICILLIN-POT CLAVULANATE 875-125 MG PO TABS: 1.0000 | ORAL_TABLET | Freq: Two times a day (BID) | ORAL | 0 refills | Status: DC

## 2021-07-11 NOTE — Progress Notes (Signed)

## 2021-07-11 NOTE — Addendum Note (Signed)
Addended by: Mar Daring on: 07/11/2021 02:09 PM ? ? Modules accepted: Orders ? ?

## 2021-07-13 ENCOUNTER — Encounter: Payer: Self-pay | Admitting: Emergency Medicine

## 2021-07-13 ENCOUNTER — Emergency Department: Payer: 59

## 2021-07-13 ENCOUNTER — Emergency Department
Admission: EM | Admit: 2021-07-13 | Discharge: 2021-07-13 | Discharge status: Against Medical Advice | Payer: 59 | Attending: Emergency Medicine | Admitting: Emergency Medicine

## 2021-07-13 DIAGNOSIS — Z20822 Contact with and (suspected) exposure to covid-19: Secondary | ICD-10-CM | POA: Insufficient documentation

## 2021-07-13 DIAGNOSIS — Z5321 Procedure and treatment not carried out due to patient leaving prior to being seen by health care provider: Secondary | ICD-10-CM | POA: Insufficient documentation

## 2021-07-13 DIAGNOSIS — J449 Chronic obstructive pulmonary disease, unspecified: Secondary | ICD-10-CM | POA: Insufficient documentation

## 2021-07-13 DIAGNOSIS — R058 Other specified cough: Secondary | ICD-10-CM | POA: Diagnosis not present

## 2021-07-13 DIAGNOSIS — R0602 Shortness of breath: Secondary | ICD-10-CM | POA: Diagnosis present

## 2021-07-13 LAB — CBC WITH DIFFERENTIAL/PLATELET
Abs Immature Granulocytes: 0.02 10*3/uL (ref 0.00–0.07)
Basophils Absolute: 0.1 10*3/uL (ref 0.0–0.1)
Basophils Relative: 1 %
Eosinophils Absolute: 0 10*3/uL (ref 0.0–0.5)
Eosinophils Relative: 0 %
HCT: 42.5 % (ref 36.0–46.0)
Hemoglobin: 13.8 g/dL (ref 12.0–15.0)
Immature Granulocytes: 0 %
Lymphocytes Relative: 9 %
Lymphs Abs: 0.7 10*3/uL (ref 0.7–4.0)
MCH: 25.7 pg — ABNORMAL LOW (ref 26.0–34.0)
MCHC: 32.5 g/dL (ref 30.0–36.0)
MCV: 79 fL — ABNORMAL LOW (ref 80.0–100.0)
Monocytes Absolute: 0.3 10*3/uL (ref 0.1–1.0)
Monocytes Relative: 3 %
Neutro Abs: 7.5 10*3/uL (ref 1.7–7.7)
Neutrophils Relative %: 87 %
Platelets: 259 10*3/uL (ref 150–400)
RBC: 5.38 MIL/uL — ABNORMAL HIGH (ref 3.87–5.11)
RDW: 14.9 % (ref 11.5–15.5)
WBC: 8.6 10*3/uL (ref 4.0–10.5)
nRBC: 0 % (ref 0.0–0.2)

## 2021-07-13 LAB — COMPREHENSIVE METABOLIC PANEL
ALT: 17 U/L (ref 0–44)
AST: 19 U/L (ref 15–41)
Albumin: 3.9 g/dL (ref 3.5–5.0)
Alkaline Phosphatase: 91 U/L (ref 38–126)
Anion gap: 9 (ref 5–15)
BUN: 13 mg/dL (ref 6–20)
CO2: 25 mmol/L (ref 22–32)
Calcium: 9.4 mg/dL (ref 8.9–10.3)
Chloride: 103 mmol/L (ref 98–111)
Creatinine, Ser: 0.87 mg/dL (ref 0.44–1.00)
GFR, Estimated: >60 mL/min (ref >60)
Glucose, Bld: 114 mg/dL — ABNORMAL HIGH (ref 70–99)
Potassium: 4.2 mmol/L (ref 3.5–5.1)
Sodium: 137 mmol/L (ref 135–145)
Total Bilirubin: 0.5 mg/dL (ref 0.3–1.2)
Total Protein: 7.6 g/dL (ref 6.5–8.1)

## 2021-07-13 LAB — RESP PANEL BY RT-PCR (FLU A&B, COVID) ARPGX2
Influenza A by PCR: NEGATIVE
Influenza B by PCR: NEGATIVE
SARS Coronavirus 2 by RT PCR: NEGATIVE

## 2021-07-13 LAB — TROPONIN I (HIGH SENSITIVITY): Troponin I (High Sensitivity): 3 ng/L (ref <18)

## 2021-07-13 MED ORDER — IPRATROPIUM-ALBUTEROL 0.5-2.5 (3) MG/3ML IN SOLN
RESPIRATORY_TRACT | Status: AC
  Administered 2021-07-13: 3 mL via RESPIRATORY_TRACT
  Filled 2021-07-13: qty 3

## 2021-07-13 MED ORDER — IPRATROPIUM-ALBUTEROL 0.5-2.5 (3) MG/3ML IN SOLN: 3.0000 mL | Freq: Once | RESPIRATORY_TRACT | Status: AC

## 2021-07-13 NOTE — ED Notes (Signed)
Pt endorses mild improvement after the breathing treatment.  ?

## 2021-07-13 NOTE — ED Triage Notes (Signed)
Pt presents via POV with SOB - Hx of COPD has tried at home inhaler without improvement in sx. Wears 1L [place] at night and has increased to 2L over the last 2 days. Pt also has a mild dry cough that developed 2 days ago. Denies CP.  ?

## 2021-07-19 ENCOUNTER — Encounter: Payer: Self-pay | Admitting: Adult Health

## 2021-07-19 ENCOUNTER — Telehealth: Payer: Self-pay

## 2021-07-19 ENCOUNTER — Ambulatory Visit (INDEPENDENT_AMBULATORY_CARE_PROVIDER_SITE_OTHER): Payer: 59 | Admitting: Adult Health

## 2021-07-19 DIAGNOSIS — J441 Chronic obstructive pulmonary disease with (acute) exacerbation: Secondary | ICD-10-CM

## 2021-07-19 DIAGNOSIS — F17210 Nicotine dependence, cigarettes, uncomplicated: Secondary | ICD-10-CM

## 2021-07-19 MED ORDER — PREDNISONE 20 MG PO TABS: 20.0000 mg | ORAL_TABLET | Freq: Every day | ORAL | 0 refills | Status: DC

## 2021-07-19 MED ORDER — AMOXICILLIN-POT CLAVULANATE 875-125 MG PO TABS: 1.0000 | ORAL_TABLET | Freq: Two times a day (BID) | ORAL | 0 refills | Status: DC

## 2021-07-19 MED ORDER — AEROCHAMBER MV MISC: 0 refills | Status: DC

## 2021-07-19 NOTE — Patient Instructions (Addendum)
Extend Augmentin for additional 3 days .  Prednisone '20mg'$  daily for 5 days  Delsym 2 tsp Twice daily  As needed  cough Tessalon Three times a day  for cough .As needed   Saline nasal rinses As needed   Continue on BREZTRI 2 puffs Twice daily  , rinse after use.  Albuterol inhaler /neb As needed   Fluids and rest  Tylenol As needed   Work on not smoking .  Follow up with Dr. Patsey Berthold in 6-8 weeks and As needed   Please contact office for sooner follow up if symptoms do not improve or worsen or seek emergency care

## 2021-07-19 NOTE — Progress Notes (Signed)
$'@Patient'G$  ID: Andrea [name], female    DOB: 01-04-1966, 56 y.o.   MRN: [id number]  Chief Complaint  Patient presents with   Follow-up    Referring provider: Birdie Sons, MD  HPI: 56 year old female active smoker followed for moderate COPD   TEST/EVENTS :  PFT January 15, 2021 shows moderate to severe airflow obstruction without significant bronchodilator response.  FEV1 53%, ratio 63, FVC 67%, DLCO 57%.  07/19/2021 Follow up : COPD  Patient returns for a 15-monthfollow-up.  Patient has underlying moderate COPD.  She is maintained on triple therapy with Breztri inhaler twice daily.  Uses albuterol inhaler or nebulizer. She she has been referred to the lung cancer screening program but has not been set up to date.  Patient says she canceled this due to illness. She does continue to smoke.  We discussed smoking cessation in detail. She complains over the last week or 2 she has had increased cough congestion sinus drainage and productive cough.  She was seen by her primary care provider 1 week ago started on [place] did add prednisone.  She was to the emergency room but left due to long wait.  Chest x-ray showed no acute process.  Respiratory viral panel was negative. Patient says she has some better bed finished up Augmentin yesterday and still has a lot of cough and congestion and some intermittent wheezing.  She uses her albuterol inhaler or nebulizer at least 3-4 times a day.  She denies any hemoptysis, chest pain, orthopnea, edema.   Allergies  Allergen Reactions   Alum & Mag Hydroxide-Simeth Diarrhea and Nausea Only   Aspirin     Nosebleed    Bactrim [Sulfamethoxazole-Trimethoprim] Itching   Cefdinir Other (See Comments)    tachycardia   Chantix [Varenicline]     Bad dreams   Doxycycline Other (See Comments)    Nausea, migraine   Multaq [Dronedarone] Other (See Comments)    Lip/ mouth numbness/ tongue swelling   Ivp Dye [Iodinated Contrast Media] Palpitations     Immunization History  Administered Date(s) Administered   Moderna Sars-Covid-2 Vaccination 11/28/2019, 12/26/2019   Pneumococcal Conjugate-13 11/20/2012   Pneumococcal Polysaccharide-23 08/10/2012    Past Medical History:  Diagnosis Date   Anxiety    panic attacks, chest pain   Arthritis    COPD (chronic obstructive pulmonary disease) (HCC)    GERD (gastroesophageal reflux disease)    Hypertension    not medicated   Non-obstructive CAD in native artery    a. cardiac cath 01/2014: showed minor irregularities, mildly elevated left ventricular end-diastolic pressure and normal ejection fraction; b. 03/2015 MV: low risk.   Palpitations    a. 08/2016 Event Monitor: occas PVC's, two brief runs of SVT (4 and 7 beats).   PONV (postoperative nausea and vomiting)    Symptomatic PVC's (premature ventricular contractions)    Tobacco abuse    Wears dentures    partial upper and lower    Tobacco History: Social History   Tobacco Use  Smoking Status Every Day   Packs/day: 1.00   Years: 20.00   Pack years: 20.00   Types: Cigarettes  Smokeless Tobacco Never  Tobacco Comments   1 pack of cigarettes last 3 days-07/19/2021   Ready to quit: No Counseling given: Yes Tobacco comments: 1 pack of cigarettes last 3 days-07/19/2021   Outpatient Medications Prior to Visit  Medication Sig Dispense Refill   albuterol (PROVENTIL) (2.5 MG/3ML) 0.083% nebulizer solution USE 1 VIAL IN NEBULIZER EVERY 4 (  FOUR) HOURS AS NEEDED FOR WHEEZING OR SHORTNESS OF BREATH. 120 mL 11   Budeson-Glycopyrrol-Formoterol (BREZTRI AEROSPHERE) 160-9-4.8 MCG/ACT AERO Inhale 2 puffs into the lungs in the morning and at bedtime. 10.7 g 5   clonazePAM (KLONOPIN) 1 MG tablet TAKE 1 TABLET BY MOUTH 2 TIMES DAILY AS NEEDED. 60 tablet 3   dexlansoprazole (DEXILANT) 60 MG capsule Take 1 capsule (60 mg total) by mouth daily. 30 capsule 3   hydrochlorothiazide (MICROZIDE) 12.5 MG capsule Take 1 capsule (12.5 mg total) by mouth  daily. 90 capsule 3   levalbuterol (XOPENEX HFA) 45 MCG/ACT inhaler Inhale 2 puffs into the lungs every 4 (four) hours as needed for wheezing. 1 each 11   Misc. Devices (PULSE OXIMETER DELUXE) MISC Use daily to monitor oxygen levels 1 each 0   Nebulizers (COMPRESSOR/NEBULIZER) MISC Supplies only 1 each 1   Nebulizers (COMPRESSOR/NEBULIZER) MISC Use as directed 1 each 0   nitroGLYCERIN (NITROSTAT) 0.4 MG SL tablet Place 1 tablet (0.4 mg total) under the tongue every 5 (five) minutes as needed for chest pain. 25 tablet 0   OXYGEN Inhale 1 L into the lungs at bedtime.     Respiratory Therapy Supplies (FLUTTER) DEVI 1 Device by Does not apply route daily. 1 each 0   Spacer/Aero-Holding Chambers (AEROCHAMBER MV) inhaler Use as instructed 1 each 0   amoxicillin-clavulanate (AUGMENTIN) 875-125 MG tablet Take 1 tablet by mouth 2 (two) times daily. 14 tablet 0   pravastatin (PRAVACHOL) 20 MG tablet Take 1 tablet (20 mg total) by mouth daily. 30 tablet 3   predniSONE (STERAPRED UNI-PAK 21 TAB) 10 MG (21) TBPK tablet 6 day taper; take as directed on package instructions 21 tablet 0   ALPRAZolam (XANAX) 0.5 MG tablet Take 1 tablet (0.5 mg total) by mouth every 8 (eight) hours as needed. May take between doses of clonazepam as needed. (Patient not taking: Reported on 07/19/2021) 30 tablet 1   hydroxychloroquine (PLAQUENIL) 200 MG tablet Take 2 tablets (400 mg total) by mouth daily for Discoid Lupus (L93.0) (Patient not taking: Reported on 07/19/2021) 180 tablet 0   No facility-administered medications prior to visit.     Review of Systems:   Constitutional:   No  weight loss, night sweats,  Fevers, chills,  +fatigue, or  lassitude.  HEENT:   No headaches,  Difficulty swallowing,  Tooth/dental problems, or  Sore throat,                No sneezing, itching, ear ache,  +nasal congestion, post nasal drip,   CV:  No chest pain,  Orthopnea, PND, swelling in lower extremities, anasarca, dizziness,  palpitations, syncope.   GI  No heartburn, indigestion, abdominal pain, nausea, vomiting, diarrhea, change in bowel habits, loss of appetite, bloody stools.   Resp:   No chest wall deformity  Skin: no rash or lesions.  GU: no dysuria, change in color of urine, no urgency or frequency.  No flank pain, no hematuria   MS:  No joint pain or swelling.  No decreased range of motion.  No back pain.    Physical Exam  BP 132/74 (BP Location: Left Arm, Cuff Size: Normal)   Pulse 91   Temp 97.8 F (36.6 C) (Temporal)   Ht '5\' 3"'$  (1.6 m)   Wt 147 lb 9.6 oz (67 kg)   SpO2 95%   BMI 26.15 kg/m   GEN: A/Ox3; pleasant , NAD, well nourished    HEENT:  [place]/AT,  EACs-clear, TMs-wnl, NOSE-clear,  THROAT-clear, no lesions, no postnasal drip or exudate noted.   NECK:  Supple w/ fair ROM; no JVD; normal carotid impulses w/o bruits; no thyromegaly or nodules palpated; no lymphadenopathy.    RESP few scattered rhonchi no accessory muscle use, no dullness to percussion  CARD:  RRR, no m/r/g, no peripheral edema, pulses intact, no cyanosis or clubbing.  GI:   Soft & nt; nml bowel sounds; no organomegaly or masses detected.   Musco: Warm bil, no deformities or joint swelling noted.   Neuro: alert, no focal deficits noted.    Skin: Warm, no lesions or rashes    Lab Results:  CBC   BMET    ProBNP No results found for: PROBNP  Imaging: DG Chest 2 View  Result Date: 07/13/2021 CLINICAL DATA:  Shortness of breath EXAM: CHEST - 2 VIEW COMPARISON:  05/20/2021 FINDINGS: Cardiac size is within normal limits. Thoracic aorta is tortuous. There are no signs of pulmonary edema or focal pulmonary consolidation. Low position of diaphragms suggests possible COPD. There is no pleural effusion or pneumothorax. IMPRESSION: There are no signs of pulmonary edema or focal pulmonary consolidation. Electronically Signed   By: Elmer Picker M.D.   On: 07/13/2021 17:37          View : No data to  display.          No results found for: NITRICOXIDE      Assessment & Plan:   COPD exacerbation ([place]) Slow to resolve COPD exacerbation-we will extend out Augmentin for an additional 3 days.  And short course of prednisone extension.  Recent respiratory viral panel was negative.  Chest x-ray unrevealing. Patient is encouraged on smoking cessation Order placed for lung cancer screening program to recontact patient  Plan  . Patient Instructions  Extend Augmentin for additional 3 days .  Prednisone '20mg'$  daily for 5 days  Delsym 2 tsp Twice daily  As needed  cough Tessalon Three times a day  for cough .As needed   Saline nasal rinses As needed   Continue on BREZTRI 2 puffs Twice daily  , rinse after use.  Albuterol inhaler /neb As needed   Fluids and rest  Tylenol As needed   Work on not smoking .  Follow up with Dr. Patsey Berthold in 6-8 weeks and As needed   Please contact office for sooner follow up if symptoms do not improve or worsen or seek emergency care         Rexene Edison, NP 07/19/2021

## 2021-07-19 NOTE — Telephone Encounter (Signed)
Patient referred to lung cancer screening program 04/2021. It appears that CT was due 06/2021, it does not look like this has been scheduled.   Please advise. Thanks

## 2021-07-19 NOTE — Assessment & Plan Note (Signed)
>>  ASSESSMENT AND PLAN FOR COPD (CHRONIC OBSTRUCTIVE PULMONARY DISEASE) (HCC) WRITTEN ON 07/19/2021  3:31 PM BY PARRETT, Disa S, NP  Slow to resolve COPD exacerbation-we will extend out Augmentin  for an additional 3 days.  And short course of prednisone  extension.  Recent respiratory viral panel was negative.  Chest x-ray unrevealing. Patient is encouraged on smoking cessation Order placed for lung cancer screening program to recontact patient  Plan  . Patient Instructions  Extend Augmentin  for additional 3 days .  Prednisone  20mg  daily for 5 days  Delsym 2 tsp Twice daily  As needed  cough Tessalon  Three times a day  for cough .As needed   Saline nasal rinses As needed   Continue on BREZTRI  2 puffs Twice daily  , rinse after use.  Albuterol  inhaler /neb As needed   Fluids and rest  Tylenol  As needed   Work on not smoking .  Follow up with Dr. Tamea in 6-8 weeks and As needed   Please contact office for sooner follow up if symptoms do not improve or worsen or seek emergency care

## 2021-07-19 NOTE — Assessment & Plan Note (Signed)
Slow to resolve COPD exacerbation-we will extend out Augmentin for an additional 3 days.  And short course of prednisone extension.  Recent respiratory viral panel was negative.  Chest x-ray unrevealing. Patient is encouraged on smoking cessation Order placed for lung cancer screening program to recontact patient  Plan  . Patient Instructions  Extend Augmentin for additional 3 days .  Prednisone '20mg'$  daily for 5 days  Delsym 2 tsp Twice daily  As needed  cough Tessalon Three times a day  for cough .As needed   Saline nasal rinses As needed   Continue on BREZTRI 2 puffs Twice daily  , rinse after use.  Albuterol inhaler /neb As needed   Fluids and rest  Tylenol As needed   Work on not smoking .  Follow up with Dr. Patsey Berthold in 6-8 weeks and As needed   Please contact office for sooner follow up if symptoms do not improve or worsen or seek emergency care

## 2021-07-20 NOTE — Telephone Encounter (Signed)
Left message for pt to call back to reschedule SD[facility] and lung screening CT scan. Will close this note and refer to referral notes.

## 2021-07-22 ENCOUNTER — Telehealth: Payer: 59 | Admitting: Family

## 2021-07-22 DIAGNOSIS — J019 Acute sinusitis, unspecified: Secondary | ICD-10-CM

## 2021-07-22 MED ORDER — DOXYCYCLINE HYCLATE 100 MG PO TABS: 100.0000 mg | ORAL_TABLET | Freq: Two times a day (BID) | ORAL | 0 refills | Status: DC

## 2021-07-22 MED ORDER — ONDANSETRON HCL 4 MG PO TABS: 4.0000 mg | ORAL_TABLET | Freq: Three times a day (TID) | ORAL | 0 refills | Status: DC | PRN

## 2021-07-22 NOTE — Progress Notes (Signed)
E-Visit for Sinus Problems  We are sorry that you are not feeling well.  Here is how we plan to help!  Based on what you have shared with me it looks like you have sinusitis.  Sinusitis is inflammation and infection in the sinus cavities of the head.  Based on your presentation I believe you most likely have Acute Bacterial Sinusitis.  This is an infection caused by bacteria and is treated with antibiotics. I have prescribed Doxycycline '100mg'$  by mouth twice a day for 10 days.  I have sent in zofran you will take 30 mins prior to taking the doxycycline. You may use an oral decongestant such as Mucinex D or if you have glaucoma or high blood pressure use plain Mucinex. Saline nasal spray help and can safely be used as often as needed for congestion.  If you develop worsening sinus pain, fever or notice severe headache and vision changes, or if symptoms are not better after completion of antibiotic, please schedule an appointment with a health care provider.    Sinus infections are not as easily transmitted as other respiratory infection, however we still recommend that you avoid close contact with loved ones, especially the very young and elderly.  Remember to wash your hands thoroughly throughout the day as this is the number one way to prevent the spread of infection!  Home Care: Only take medications as instructed by your medical team. Complete the entire course of an antibiotic. Do not take these medications with alcohol. A steam or ultrasonic humidifier can help congestion.  You can place a towel over your head and breathe in the steam from hot water coming from a faucet. Avoid close contacts especially the very young and the elderly. Cover your mouth when you cough or sneeze. Always remember to wash your hands.  Get Help Right Away If: You develop worsening fever or sinus pain. You develop a severe head ache or visual changes. Your symptoms persist after you have completed your treatment  plan.  Make sure you Understand these instructions. Will watch your condition. Will get help right away if you are not doing well or get worse.  Thank you for choosing an e-visit.  Your e-visit answers were reviewed by a board certified advanced clinical practitioner to complete your personal care plan. Depending upon the condition, your plan could have included both over the counter or prescription medications.  Please review your pharmacy choice. Make sure the pharmacy is open so you can pick up prescription now. If there is a problem, you may contact your provider through [employer] and have the prescription routed to another pharmacy.  Your safety is important to [place]. If you have drug allergies check your prescription carefully.   For the next 24 hours you can use MyChart to ask questions about today's visit, request a non-urgent call back, or ask for a work or school excuse. You will get an email in the next two days asking about your experience. I hope that your e-visit has been valuable and will speed your recovery.  Approximately 5 minutes was spent documenting and reviewing patient's chart.

## 2021-07-24 ENCOUNTER — Telehealth (INDEPENDENT_AMBULATORY_CARE_PROVIDER_SITE_OTHER): Payer: 59 | Admitting: Family Medicine

## 2021-07-24 DIAGNOSIS — J0110 Acute frontal sinusitis, unspecified: Secondary | ICD-10-CM | POA: Diagnosis not present

## 2021-07-24 MED ORDER — AZITHROMYCIN 250 MG PO TABS: ORAL_TABLET | ORAL | 0 refills | Status: AC

## 2021-07-24 MED ORDER — FLUTICASONE PROPIONATE 50 MCG/ACT NA SUSP: 2.0000 | Freq: Every day | NASAL | 6 refills | Status: DC

## 2021-07-24 NOTE — Progress Notes (Signed)
MyChart Video Visit    Virtual Visit via Video Note   This visit type was conducted due to national recommendations for restrictions regarding the COVID-19 Pandemic (e.g. social distancing) in an effort to limit this patient's exposure and mitigate transmission in our community. This patient is at least at moderate risk for complications without adequate follow up. This format is felt to be most appropriate for this patient at this time. Physical exam was limited by quality of the video and audio technology used for the visit.   Patient location: home  Provider location: [facility]  I discussed the limitations of evaluation and management by telemedicine and the availability of in person appointments. The patient expressed understanding and agreed to proceed.  Patient: Andrea [name]   DOB: 02/11/1966   56 y.o. Female  MRN: [id number] Visit Date: 07/24/2021  Today's healthcare provider: Lelon Huh, MD   Chief Complaint  Patient presents with   Sinus Problem   Subjective    Upper respiratory symptoms She complains of facial pain, nasal congestion, post nasal drip, purulent nasal discharge, and sinus pressure.with no fever, chills, night sweats or weight loss. Onset of symptoms was a few weeks ago and staying constant.She is drinking plenty of fluids.  Past history is significant for COPD. Patient is smoking maybe 3 cigarettes per day.   Was treated with Augmentin and prednisone earlier this month for LRI and she reports wheezing and shortness of breath are much better. However her sinus congestion and pressure are getting worse. She had an e-visit over the weekend and prescribed doxycycline, but never started it due to history of adverse reactions to that antibiotic. She does have old prescription for fluticasone nasal spray that she has been using.   ---------------------------------------------------------------------------------------------------    Medications: Outpatient  Medications Prior to Visit  Medication Sig   albuterol (PROVENTIL) (2.5 MG/3ML) 0.083% nebulizer solution USE 1 VIAL IN NEBULIZER EVERY 4 (FOUR) HOURS AS NEEDED FOR WHEEZING OR SHORTNESS OF BREATH.   ALPRAZolam (XANAX) 0.5 MG tablet Take 1 tablet (0.5 mg total) by mouth every 8 (eight) hours as needed. May take between doses of clonazepam as needed. (Patient not taking: Reported on 07/19/2021)   amoxicillin-clavulanate (AUGMENTIN) 875-125 MG tablet Take 1 tablet by mouth 2 (two) times daily.   Budeson-Glycopyrrol-Formoterol (BREZTRI AEROSPHERE) 160-9-4.8 MCG/ACT AERO Inhale 2 puffs into the lungs in the morning and at bedtime.   clonazePAM (KLONOPIN) 1 MG tablet TAKE 1 TABLET BY MOUTH 2 TIMES DAILY AS NEEDED.   dexlansoprazole (DEXILANT) 60 MG capsule Take 1 capsule (60 mg total) by mouth daily.   doxycycline (VIBRA-TABS) 100 MG tablet Take 1 tablet (100 mg total) by mouth 2 (two) times daily.   hydrochlorothiazide (MICROZIDE) 12.5 MG capsule Take 1 capsule (12.5 mg total) by mouth daily.   levalbuterol (XOPENEX HFA) 45 MCG/ACT inhaler Inhale 2 puffs into the lungs every 4 (four) hours as needed for wheezing.   Misc. Devices (PULSE OXIMETER DELUXE) MISC Use daily to monitor oxygen levels   Nebulizers (COMPRESSOR/NEBULIZER) MISC Supplies only   Nebulizers (COMPRESSOR/NEBULIZER) MISC Use as directed   nitroGLYCERIN (NITROSTAT) 0.4 MG SL tablet Place 1 tablet (0.4 mg total) under the tongue every 5 (five) minutes as needed for chest pain.   ondansetron (ZOFRAN) 4 MG tablet Take 1 tablet (4 mg total) by mouth every 8 (eight) hours as needed for nausea or vomiting.   OXYGEN Inhale 1 L into the lungs at bedtime.   predniSONE (DELTASONE) 20 MG tablet  Take 1 tablet (20 mg total) by mouth daily with breakfast.   Respiratory Therapy Supplies (FLUTTER) DEVI 1 Device by Does not apply route daily.   Spacer/Aero-Holding Chambers (AEROCHAMBER MV) inhaler Use as instructed   Spacer/Aero-Holding Chambers  (AEROCHAMBER MV) inhaler Use as instructed   No facility-administered medications prior to visit.       Objective    There were no vitals taken for this visit.     Physical Exam  Awake, alert, oriented x 3. In no apparent distress    Assessment & Plan     1. Acute frontal sinusitis, recurrence not specified  - azithromycin (ZITHROMAX) 250 MG tablet; Take 2 tablets on day 1, then 1 tablet daily on days 2 through 5  Dispense: 6 tablet; Refill: 0 - fluticasone (FLONASE) 50 MCG/ACT nasal spray; Place 2 sprays into both nostrils daily.  Dispense: 16 g; Refill: 6   Call if symptoms change or if not rapidly improving.        I discussed the assessment and treatment plan with the patient. The patient was provided an opportunity to ask questions and all were answered. The patient agreed with the plan and demonstrated an understanding of the instructions.   The patient was advised to call back or seek an in-person evaluation if the symptoms worsen or if the condition fails to improve as anticipated.  I provided  minutes of non-face-to-face time during this encounter.  The entirety of the information documented in the History of Present Illness, Review of Systems and Physical Exam were personally obtained by me. Portions of this information were initially documented by the CMA and reviewed by me for thoroughness and accuracy.    Lelon Huh, MD [facility] [phone] (phone) [phone] (fax)  [place]

## 2021-08-02 ENCOUNTER — Ambulatory Visit: Payer: 59 | Admitting: Medical

## 2021-08-08 NOTE — Progress Notes (Signed)
Agree with the details of the visit as noted by Ameliya Parrett, NP.  C. Laura Rada Zegers, MD [facility] 

## 2021-08-15 ENCOUNTER — Other Ambulatory Visit: Payer: Self-pay | Admitting: Family Medicine

## 2021-08-15 NOTE — Telephone Encounter (Signed)
Requested medications are due for refill today.  yes  Requested medications are on the active medications list.  yes  Last refill. 04/21/2021 #60 3 refills  Future visit scheduled.   no  Notes to clinic.  Medication refill is not delegated.    Requested Prescriptions  Pending Prescriptions Disp Refills   clonazePAM (KLONOPIN) 1 MG tablet [Pharmacy Med Name: CLONAZEPAM 1 MG TABLET] 60 tablet     Sig: TAKE 1 TABLET BY MOUTH TWICE A DAY AS NEEDED     Not Delegated - Psychiatry: Anxiolytics/Hypnotics 2 Failed - 08/15/2021  4:56 PM      Failed - This refill cannot be delegated      Failed - Urine Drug Screen completed in last 360 days      Passed - Patient is not pregnant      Passed - Valid encounter within last 6 months    Recent Outpatient Visits           3 weeks ago Acute frontal sinusitis, recurrence not specified   [facility] Birdie Sons, MD   3 months ago Hyperlipidemia, unspecified hyperlipidemia type   [facility] Birdie Sons, MD   7 months ago COPD, GOLD B   [facility] Birdie Sons, MD   10 months ago Shortness of breath   [facility] Birdie Sons, MD   1 year ago COPD exacerbation [facility])   [facility] Birdie Sons, MD

## 2021-08-23 ENCOUNTER — Telehealth: Payer: 59 | Admitting: Physician Assistant

## 2021-08-23 DIAGNOSIS — B9789 Other viral agents as the cause of diseases classified elsewhere: Secondary | ICD-10-CM

## 2021-08-23 DIAGNOSIS — J019 Acute sinusitis, unspecified: Secondary | ICD-10-CM | POA: Diagnosis not present

## 2021-08-23 DIAGNOSIS — J441 Chronic obstructive pulmonary disease with (acute) exacerbation: Secondary | ICD-10-CM | POA: Diagnosis not present

## 2021-08-23 MED ORDER — PREDNISONE 20 MG PO TABS: 40.0000 mg | ORAL_TABLET | Freq: Every day | ORAL | 0 refills | Status: DC

## 2021-08-23 MED ORDER — BENZONATATE 100 MG PO CAPS: 100.0000 mg | ORAL_CAPSULE | Freq: Three times a day (TID) | ORAL | 0 refills | Status: DC | PRN

## 2021-08-23 MED ORDER — IPRATROPIUM BROMIDE 0.03 % NA SOLN: 2.0000 | Freq: Two times a day (BID) | NASAL | 0 refills | Status: DC

## 2021-08-23 NOTE — Progress Notes (Signed)
I have spent 5 minutes in review of e-visit questionnaire, review and updating patient chart, medical decision making and response to patient.   Kirsten Mckone Cody Mozes Sagar, PA-C    

## 2021-09-15 ENCOUNTER — Telehealth: Payer: 59 | Admitting: Physician Assistant

## 2021-09-15 DIAGNOSIS — R051 Acute cough: Secondary | ICD-10-CM | POA: Diagnosis not present

## 2021-09-15 MED ORDER — PREDNISONE 20 MG PO TABS: 40.0000 mg | ORAL_TABLET | Freq: Every day | ORAL | 0 refills | Status: DC

## 2021-09-15 MED ORDER — AZITHROMYCIN 250 MG PO TABS: ORAL_TABLET | ORAL | 0 refills | Status: AC

## 2021-09-15 NOTE — Progress Notes (Signed)
We are sorry that you are not feeling well.  Here is how we plan to help!  Based on your presentation I believe you most likely have A cough due to bacteria.  When patients have a fever and a productive cough with a change in color or increased sputum production, we are concerned about bacterial bronchitis.  If left untreated it can progress to pneumonia.  If your symptoms do not improve with your treatment plan it is important that you contact your provider.   I have prescribed Azithromyin 250 mg: two tablets now and then one tablet daily for 4 additonal days    In addition you may use oral prednisone 40 mg x 5 days.  From your responses in the eVisit questionnaire you describe inflammation in the upper respiratory tract which is causing a significant cough.  This is commonly called Bronchitis and has four common causes:   Allergies Viral Infections Acid Reflux Bacterial Infection Allergies, viruses and acid reflux are treated by controlling symptoms or eliminating the cause. An example might be a cough caused by taking certain blood pressure medications. You stop the cough by changing the medication. Another example might be a cough caused by acid reflux. Controlling the reflux helps control the cough.  USE OF BRONCHODILATOR ("RESCUE") INHALERS: There is a risk from using your bronchodilator too frequently.  The risk is that over-reliance on a medication which only relaxes the muscles surrounding the breathing tubes can reduce the effectiveness of medications prescribed to reduce swelling and congestion of the tubes themselves.  Although you feel brief relief from the bronchodilator inhaler, your asthma may actually be worsening with the tubes becoming more swollen and filled with mucus.  This can delay other crucial treatments, such as oral steroid medications. If you need to use a bronchodilator inhaler daily, several times per day, you should discuss this with your provider.  There are probably  better treatments that could be used to keep your asthma under control.     HOME CARE Only take medications as instructed by your medical team. Complete the entire course of an antibiotic. Drink plenty of fluids and get plenty of rest. Avoid close contacts especially the very young and the elderly Cover your mouth if you cough or cough into your sleeve. Always remember to wash your hands A steam or ultrasonic humidifier can help congestion.   GET HELP RIGHT AWAY IF: You develop worsening fever. You become short of breath You cough up blood. Your symptoms persist after you have completed your treatment plan MAKE SURE YOU  Understand these instructions. Will watch your condition. Will get help right away if you are not doing well or get worse.    Thank you for choosing an e-visit.  Your e-visit answers were reviewed by a board certified advanced clinical practitioner to complete your personal care plan. Depending upon the condition, your plan could have included both over the counter or prescription medications.  Please review your pharmacy choice. Make sure the pharmacy is open so you can pick up prescription now. If there is a problem, you may contact your provider through [employer] and have the prescription routed to another pharmacy.  Your safety is important to [place]. If you have drug allergies check your prescription carefully.   For the next 24 hours you can use MyChart to ask questions about today's visit, request a non-urgent call back, or ask for a work or school excuse. You will get an email in the next two  days asking about your experience. I hope that your e-visit has been valuable and will speed your recovery.  Time spent with patient today was 5-10 minutes which consisted of chart review, discussing diagnosis, work up, treatment answering questions and documentation.  Inda Coke PA-C

## 2021-10-01 ENCOUNTER — Telehealth: Payer: Self-pay | Admitting: Family Medicine

## 2021-10-01 ENCOUNTER — Other Ambulatory Visit: Payer: Self-pay | Admitting: *Deleted

## 2021-10-01 NOTE — Telephone Encounter (Signed)
Medication Refill - Medication: durezol 01005% eye drops  Has the patient contacted their pharmacy? Yno , med was prescribed by a different Dr, not showing on med list (Agent: If no, request that the patient contact the pharmacy for the refill. If patient does not wish to contact the pharmacy document the reason why and proceed with request.) (Agent: If yes, when and what did the pharmacy advise?)patient contacted pcp  Preferred Pharmacy (with phone number or street name):  [id number] [address]:  [phone] Fax:  [phone]    Has the patient been seen for an appointment in the last year OR does the patient have an upcoming appointment? yes  Agent: Please be advised that RX refills may take up to 3 business days. We ask that you follow-up with your pharmacy.

## 2021-10-02 NOTE — Telephone Encounter (Signed)
Requested medication (s) are due for refill today: No  Requested medication (s) are on the active medication list: No  Last refill:  not on current med list  Future visit scheduled: 10/08/21, seen 07/24/21  Notes to clinic:  Not on current med list, written by Historical Provider, no protocol to follow, please assess.      Requested Prescriptions  Pending Prescriptions Disp Refills   DUREZOL 0.05 % EMUL      Sig: Place 1 drop into the left eye 5 (five) times daily as needed (takes only during flares of uveitis).     Off-Protocol Failed - 10/01/2021 10:19 AM      Failed - Medication not assigned to a protocol, review manually.      Passed - Valid encounter within last 12 months    Recent Outpatient Visits           2 months ago Acute frontal sinusitis, recurrence not specified   [facility] Birdie Sons, MD   4 months ago Hyperlipidemia, unspecified hyperlipidemia type   [facility] Birdie Sons, MD   8 months ago COPD, GOLD B   [facility] Birdie Sons, MD   1 year ago Shortness of breath   [facility] Birdie Sons, MD   1 year ago COPD exacerbation [facility])   [facility] Birdie Sons, MD       Future Appointments             In 6 days Fisher, Kirstie Peri, MD [facility]   In 3 months Lucilla Lame, MD [place]

## 2021-10-03 ENCOUNTER — Ambulatory Visit: Payer: Self-pay

## 2021-10-03 NOTE — Telephone Encounter (Addendum)
Patient unable to get into [place] there appointments are booked out. Patient would like PCP to refill eye drops stating they will run out.  [facility]   [id number] [place] [address]:  [phone] Fax:  [phone]

## 2021-10-03 NOTE — Progress Notes (Unsigned)
MyChart Video Visit    Virtual Visit via Video Note   This visit type was conducted due to national recommendations for restrictions regarding the COVID-19 Pandemic (e.g. social distancing) in an effort to limit this patient's exposure and mitigate transmission in our community. This patient is at least at moderate risk for complications without adequate follow up. This format is felt to be most appropriate for this patient at this time. Physical exam was limited by quality of the video and audio technology used for the visit.   Patient location: *** Provider location: [facility]  I discussed the limitations of evaluation and management by telemedicine and the availability of in person appointments. The patient expressed understanding and agreed to proceed.  Patient: Andrea [name]   DOB: 1965/03/29   56 y.o. Female  MRN: [id number] Visit Date: 10/04/2021  Today's healthcare provider: Mikey Kirschner, PA-C   No chief complaint on file.  Subjective    Eye Pain  The left eye is affected. This is a chronic problem. The current episode started in the past 7 days. Injury mechanism: Uveitis. The pain is at a severity of 6/10. Associated symptoms include eye redness and itching.    Specialist stated it may be related to Lupus   Medications: Outpatient Medications Prior to Visit  Medication Sig  . albuterol (PROVENTIL) (2.5 MG/3ML) 0.083% nebulizer solution USE 1 VIAL IN NEBULIZER EVERY 4 (FOUR) HOURS AS NEEDED FOR WHEEZING OR SHORTNESS OF BREATH.  Marland Kitchen ALPRAZolam (XANAX) 0.5 MG tablet Take 1 tablet (0.5 mg total) by mouth every 8 (eight) hours as needed. May take between doses of clonazepam as needed.  . benzonatate (TESSALON) 100 MG capsule Take 1 capsule (100 mg total) by mouth 3 (three) times daily as needed for cough.  . Budeson-Glycopyrrol-Formoterol (BREZTRI AEROSPHERE) 160-9-4.8 MCG/ACT AERO Inhale 2 puffs into the lungs in the morning and at bedtime.  . clonazePAM  (KLONOPIN) 1 MG tablet TAKE 1 TABLET BY MOUTH TWICE A DAY AS NEEDED  . dexlansoprazole (DEXILANT) 60 MG capsule Take 1 capsule (60 mg total) by mouth daily.  . fluticasone (FLONASE) 50 MCG/ACT nasal spray Place 2 sprays into both nostrils daily.  . hydrochlorothiazide (MICROZIDE) 12.5 MG capsule Take 1 capsule (12.5 mg total) by mouth daily.  Marland Kitchen ipratropium (ATROVENT) 0.03 % nasal spray Place 2 sprays into both nostrils every 12 (twelve) hours.  Marland Kitchen levalbuterol (XOPENEX HFA) 45 MCG/ACT inhaler Inhale 2 puffs into the lungs every 4 (four) hours as needed for wheezing.  . Misc. Devices (PULSE OXIMETER DELUXE) MISC Use daily to monitor oxygen levels  . Nebulizers (COMPRESSOR/NEBULIZER) MISC Supplies only  . Nebulizers (COMPRESSOR/NEBULIZER) MISC Use as directed  . nitroGLYCERIN (NITROSTAT) 0.4 MG SL tablet Place 1 tablet (0.4 mg total) under the tongue every 5 (five) minutes as needed for chest pain.  Marland Kitchen ondansetron (ZOFRAN) 4 MG tablet Take 1 tablet (4 mg total) by mouth every 8 (eight) hours as needed for nausea or vomiting.  . OXYGEN Inhale 1 L into the lungs at bedtime.  . predniSONE (DELTASONE) 20 MG tablet Take 2 tablets (40 mg total) by mouth daily.  Marland Kitchen Respiratory Therapy Supplies (FLUTTER) DEVI 1 Device by Does not apply route daily.  Marland Kitchen Spacer/Aero-Holding Chambers (AEROCHAMBER MV) inhaler Use as instructed  . Spacer/Aero-Holding Chambers (AEROCHAMBER MV) inhaler Use as instructed   No facility-administered medications prior to visit.    Review of Systems  Eyes:  Positive for pain, redness and itching.    {Labs  Heme  Chem  Endocrine  Serology  Results Review (optional):23779}   Objective    There were no vitals taken for this visit.  {Show previous vital signs (optional):23777}   Physical Exam     Assessment & Plan     ***  No follow-ups on file.     I discussed the assessment and treatment plan with the patient. The patient was provided an opportunity to ask  questions and all were answered. The patient agreed with the plan and demonstrated an understanding of the instructions.   The patient was advised to call back or seek an in-person evaluation if the symptoms worsen or if the condition fails to improve as anticipated.  I provided *** minutes of non-face-to-face time during this encounter.  {provider attestation***:1}  Mikey Kirschner, PA-C [facility] [phone] (phone) [phone] (fax)  [place]

## 2021-10-03 NOTE — Telephone Encounter (Signed)
Chief Complaint: eye redness due to uvelitits (chronic condition) Symptoms: Left sclera red, left eyelid a little swollen, mild itching, moderate pain 6/10 Frequency: Onset on Monday Pertinent Negatives: Patient denies other symptoms Disposition: '[]'$ ED /'[]'$ Urgent Care (no appt availability in office) / '[x]'$ Appointment(In office/virtual)/ '[]'$  [facility]/ '[]'$ Home Care/ '[]'$ Refused Recommended Disposition /'[]'$ [facility]/ '[]'$  Follow-up with PCP Additional Notes: Patient says this is a chronic condition that she will have to deal with the rest of her life. She says she has an ophthalmologist that she sees for this but no appointments for 4 months and she needs to get the eye drop prescribed to clear this up. I advised the refill was refused by Dr. Caryn Section, so she will need an appointment in order to receive this medication, she agreed to first available. Appointment scheduled for tomorrow with Mikey Kirschner, PA-C.    Summary: eye redness / itchy / burning   Left eye burns, itchy and patient unable to get into [facility]. Specialist stated it may be related to Lupus. A medication refill request was sent to PCP on  10/01/2021      Reason for Disposition  [1] Only 1 eye is red AND [2] present > 48 hours  Answer Assessment - Initial Assessment Questions 1. LOCATION: Location: "What's red, the eyeball or the outer eyelids?" (Note: when callers say the eye is red, they usually mean the sclera is red)       Sclera of the eye 2. REDNESS OF SCLERA: "Is the redness in one or both eyes?" "When did the redness start?"      Left eye sclera 3. ONSET: "When did the eye become red?" (e.g., hours, days)      Monday 4. EYELIDS: "Are the eyelids red or swollen?" If Yes, ask: "How much?"      Left eyelid a little bit 5. VISION: "Is there any difficulty seeing clearly?"      Yes blurry 6. ITCHING: "Does it feel itchy?" If so ask: "How bad is it" (e.g., Scale 1-10; or mild, moderate,  severe)     Mild 7. PAIN: "Is there any pain? If Yes, ask: "How bad is it?" (e.g., Scale 1-10; or mild, moderate, severe)     6 depending on if the sun hits it  8. CONTACT LENS: "Do you wear contacts?"     No 9. CAUSE: "What do you think is causing the redness?"     Uveitis 10. OTHER SYMPTOMS: "Do you have any other symptoms?" (e.g., fever, runny nose, cough, vomiting)       No  Protocols used: Eye - Red Without Pus-A-AH

## 2021-10-03 NOTE — Telephone Encounter (Signed)
Summary: eye redness / itchy / burning   Left eye burns, itchy and patient unable to get into [facility]. Specialist stated it may be related to Lupus. A medication refill request was sent to PCP on  10/01/2021      Returned pt's call - [facility]

## 2021-10-04 ENCOUNTER — Other Ambulatory Visit: Payer: Self-pay | Admitting: Physician Assistant

## 2021-10-04 ENCOUNTER — Encounter: Payer: Self-pay | Admitting: Physician Assistant

## 2021-10-04 ENCOUNTER — Telehealth (INDEPENDENT_AMBULATORY_CARE_PROVIDER_SITE_OTHER): Payer: 59 | Admitting: Physician Assistant

## 2021-10-04 DIAGNOSIS — H2012 Chronic iridocyclitis, left eye: Secondary | ICD-10-CM

## 2021-10-04 MED ORDER — PREDNISOLONE ACETATE 1 % OP SUSP: 1.0000 [drp] | Freq: Four times a day (QID) | OPHTHALMIC | 0 refills | Status: DC

## 2021-10-04 MED ORDER — DUREZOL 0.05 % OP EMUL: 1.0000 [drp] | Freq: Four times a day (QID) | OPHTHALMIC | 0 refills | Status: AC

## 2021-10-04 NOTE — Assessment & Plan Note (Signed)
rx steroid drops, advised she make appt to follow up with [facility] even if it is far out

## 2021-10-04 NOTE — Telephone Encounter (Signed)
I don't know anything about this medication or if or when it is indicated. If she is having eye trouble she needs urgent referral to [facility]

## 2021-10-04 NOTE — Telephone Encounter (Signed)
LMTCB-Ok for PEC Nurse to give providers message 

## 2021-10-08 ENCOUNTER — Telehealth: Payer: 59 | Admitting: Family Medicine

## 2021-10-09 ENCOUNTER — Other Ambulatory Visit: Payer: Self-pay | Admitting: Gastroenterology

## 2021-10-19 ENCOUNTER — Telehealth: Payer: 59 | Admitting: Family Medicine

## 2021-10-26 ENCOUNTER — Telehealth: Payer: Self-pay | Admitting: Adult Health

## 2021-10-26 MED ORDER — BREZTRI AEROSPHERE 160-9-4.8 MCG/ACT IN AERO: 2.0000 | INHALATION_SPRAY | Freq: Two times a day (BID) | RESPIRATORY_TRACT | 5 refills | Status: DC

## 2021-10-26 NOTE — Telephone Encounter (Signed)
Breztri has been sent to preferred pharmacy.  Patient is aware and voiced her understanding.  Nothing further needed.    

## 2021-11-01 ENCOUNTER — Telehealth: Payer: 59 | Admitting: Physician Assistant

## 2021-11-01 DIAGNOSIS — J440 Chronic obstructive pulmonary disease with acute lower respiratory infection: Secondary | ICD-10-CM | POA: Diagnosis not present

## 2021-11-01 DIAGNOSIS — J441 Chronic obstructive pulmonary disease with (acute) exacerbation: Secondary | ICD-10-CM

## 2021-11-01 DIAGNOSIS — J209 Acute bronchitis, unspecified: Secondary | ICD-10-CM | POA: Diagnosis not present

## 2021-11-01 MED ORDER — AZITHROMYCIN 250 MG PO TABS: ORAL_TABLET | ORAL | 0 refills | Status: AC

## 2021-11-01 MED ORDER — BENZONATATE 100 MG PO CAPS: 100.0000 mg | ORAL_CAPSULE | Freq: Three times a day (TID) | ORAL | 0 refills | Status: DC | PRN

## 2021-11-01 MED ORDER — PREDNISONE 20 MG PO TABS: 40.0000 mg | ORAL_TABLET | Freq: Every day | ORAL | 0 refills | Status: DC

## 2021-11-01 NOTE — Progress Notes (Signed)
I have spent 5 minutes in review of e-visit questionnaire, review and updating patient chart, medical decision making and response to patient.   Kamden Reber Cody Hillman Attig, PA-C    

## 2021-11-01 NOTE — Progress Notes (Signed)
We are sorry that you are not feeling well.  Here is how we plan to help!  Based on your presentation I believe you most likely have A cough due to bacteria and a COPD exacerbation.  When patients have productive cough with a change in color or increased sputum production, we are concerned about bacterial bronchitis.  If left untreated it can progress to pneumonia.  If your symptoms do not improve with your treatment plan it is important that you contact your provider.   I have prescribed Azithromyin 250 mg: two tablets now and then one tablet daily for 4 additonal days    In addition you may use A prescription cough medication called Tessalon Perles '100mg'$ . You may take 1-2 capsules every 8 hours as needed for your cough.  I have prescribed a burst of prednisone to take as directed to calm down this flare of COPD along with the acute on chronic bronchitis.   From your responses in the eVisit questionnaire you describe inflammation in the upper respiratory tract which is causing a significant cough.  This is commonly called Bronchitis and has four common causes:   Allergies Viral Infections Acid Reflux Bacterial Infection Allergies, viruses and acid reflux are treated by controlling symptoms or eliminating the cause. An example might be a cough caused by taking certain blood pressure medications. You stop the cough by changing the medication. Another example might be a cough caused by acid reflux. Controlling the reflux helps control the cough.  USE OF BRONCHODILATOR ("RESCUE") INHALERS: There is a risk from using your bronchodilator too frequently.  The risk is that over-reliance on a medication which only relaxes the muscles surrounding the breathing tubes can reduce the effectiveness of medications prescribed to reduce swelling and congestion of the tubes themselves.  Although you feel brief relief from the bronchodilator inhaler, your asthma may actually be worsening with the tubes becoming more  swollen and filled with mucus.  This can delay other crucial treatments, such as oral steroid medications. If you need to use a bronchodilator inhaler daily, several times per day, you should discuss this with your provider.  There are probably better treatments that could be used to keep your asthma under control.     HOME CARE Only take medications as instructed by your medical team. Complete the entire course of an antibiotic. Drink plenty of fluids and get plenty of rest. Avoid close contacts especially the very young and the elderly Cover your mouth if you cough or cough into your sleeve. Always remember to wash your hands A steam or ultrasonic humidifier can help congestion.   GET HELP RIGHT AWAY IF: You develop worsening fever. You become short of breath You cough up blood. Your symptoms persist after you have completed your treatment plan MAKE SURE YOU  Understand these instructions. Will watch your condition. Will get help right away if you are not doing well or get worse.    Thank you for choosing an e-visit.  Your e-visit answers were reviewed by a board certified advanced clinical practitioner to complete your personal care plan. Depending upon the condition, your plan could have included both over the counter or prescription medications.  Please review your pharmacy choice. Make sure the pharmacy is open so you can pick up prescription now. If there is a problem, you may contact your provider through [employer] and have the prescription routed to another pharmacy.  Your safety is important to [place]. If you have drug allergies check your  prescription carefully.   For the next 24 hours you can use MyChart to ask questions about today's visit, request a non-urgent call back, or ask for a work or school excuse. You will get an email in the next two days asking about your experience. I hope that your e-visit has been valuable and will speed your recovery.

## 2021-11-06 ENCOUNTER — Other Ambulatory Visit: Payer: 59

## 2021-11-09 ENCOUNTER — Other Ambulatory Visit: Payer: Self-pay | Admitting: Medical

## 2021-11-09 DIAGNOSIS — I493 Ventricular premature depolarization: Secondary | ICD-10-CM

## 2021-11-09 DIAGNOSIS — R0600 Dyspnea, unspecified: Secondary | ICD-10-CM

## 2021-11-12 ENCOUNTER — Telehealth: Payer: Self-pay | Admitting: Family Medicine

## 2021-11-12 DIAGNOSIS — L930 Discoid lupus erythematosus: Secondary | ICD-10-CM

## 2021-11-12 NOTE — Telephone Encounter (Unsigned)
Copied from [place] [id number]. Topic: General - Other >> Nov 12, 2021 12:08 PM Everette C wrote: Reason for CRM: Medication Refill - Medication: hydroxychloroquine (PLAQUENIL) 200 MG tablet [[id number]]    Has the patient contacted their pharmacy? No. The patient elected to contact their PCP first, they have scheduled an eye appointment for November  (Agent: If no, request that the patient contact the pharmacy for the refill. If patient does not wish to contact the pharmacy document the reason why and proceed with request.) (Agent: If yes, when and what did the pharmacy advise?)  Preferred Pharmacy (with phone number or street name): [id number] [place] [place] [id number] [id number]: [phone]: [phone]: Not open 24 hours   Has the patient been seen for an appointment in the last year OR does the patient have an upcoming appointment? Yes.    Agent: Please be advised that RX refills may take up to 3 business days. We ask that you follow-up with your pharmacy.

## 2021-11-13 MED ORDER — HYDROXYCHLOROQUINE SULFATE 200 MG PO TABS: 400.0000 mg | ORAL_TABLET | Freq: Every day | ORAL | 0 refills | Status: DC

## 2021-11-19 ENCOUNTER — Telehealth (INDEPENDENT_AMBULATORY_CARE_PROVIDER_SITE_OTHER): Payer: 59 | Admitting: Family Medicine

## 2021-11-19 ENCOUNTER — Telehealth: Payer: Self-pay

## 2021-11-19 DIAGNOSIS — H2012 Chronic iridocyclitis, left eye: Secondary | ICD-10-CM | POA: Diagnosis not present

## 2021-11-19 DIAGNOSIS — E785 Hyperlipidemia, unspecified: Secondary | ICD-10-CM | POA: Diagnosis not present

## 2021-11-19 DIAGNOSIS — M329 Systemic lupus erythematosus, unspecified: Secondary | ICD-10-CM | POA: Diagnosis not present

## 2021-11-19 NOTE — Telephone Encounter (Signed)
Copied from [place] [phone]. Topic: General - Other >> Nov 19, 2021 12:24 PM Leitha Schuller wrote: Pt returned precharting call at 12.25 for 1:20 mychart appt  Advised pt Office is currently closed for lunch break  Please fu w/ pt

## 2021-11-19 NOTE — Progress Notes (Unsigned)
MyChart Video Visit    Virtual Visit via Video Note   This format is felt to be most appropriate for this patient at this time. Physical exam was limited by quality of the video and audio technology used for the visit.   Patient location: home Provider location: bfp  I discussed the limitations of evaluation and management by telemedicine and the availability of in person appointments. The patient expressed understanding and agreed to proceed.  Patient: Andrea [name]   DOB: 1966/02/29   56 y.o. Female  MRN: [id number] Visit Date: 11/19/2021  Today's healthcare provider: Lelon Huh, MD   No chief complaint on file.  Subjective    HPI  Follow up for Lupus:       Current treatment includes Plaquenil.   She reports good compliance with treatment. She feels that condition is Improved. She is not having side effects.   Had been out for the last year, started having arthritis in her hands. Had previously seen dermatology for discoid lupus. Also had evaluation by ophthalmology at [facility] with lupus uveitis. Uses difluprednate as needed. Has follow up with new eye doctor on [address] in November. -----------------------------------------------------------------------------------------   Lipid/Cholesterol, Follow-up  Last lipid panel Other pertinent labs  Lab Results  Component Value Date   CHOL 205 (H) 01/30/2021   HDL 39 (L) 01/30/2021   LDLCALC 148 (H) 01/30/2021   TRIG 98 01/30/2021   CHOLHDL 5.3 (H) 01/30/2021   Lab Results  Component Value Date   ALT 17 07/13/2021   AST 19 07/13/2021   PLT 259 07/13/2021   TSH 2.800 06/08/2019     She was last seen for this 6 months ago.  Management since that visit includes ordering labs. Patient did not complete blood work.  Was prescribed pravastatin in November and states that she had sweats and feeling dizzy.     Symptoms: No chest pain No chest pressure/discomfort  No dyspnea No lower extremity edema  No  numbness or tingling of extremity No orthopnea  No palpitations No paroxysmal nocturnal dyspnea  No speech difficulty No syncope   Current diet: well balanced Current exercise: none  The 10-year ASCVD risk score (Arnett DK, et al., 2019) is: 11.2%  ---------------------------------------------------------------------------------------------------   Medications: Outpatient Medications Prior to Visit  Medication Sig   albuterol (PROVENTIL) (2.5 MG/3ML) 0.083% nebulizer solution USE 1 VIAL IN NEBULIZER EVERY 4 (FOUR) HOURS AS NEEDED FOR WHEEZING OR SHORTNESS OF BREATH.   ALPRAZolam (XANAX) 0.5 MG tablet Take 1 tablet (0.5 mg total) by mouth every 8 (eight) hours as needed. May take between doses of clonazepam as needed.   Budeson-Glycopyrrol-Formoterol (BREZTRI AEROSPHERE) 160-9-4.8 MCG/ACT AERO Inhale 2 puffs into the lungs in the morning and at bedtime.   clonazePAM (KLONOPIN) 1 MG tablet TAKE 1 TABLET BY MOUTH TWICE A DAY AS NEEDED   dexlansoprazole (DEXILANT) 60 MG capsule TAKE 1 CAPSULE BY MOUTH EVERY DAY   fluticasone (FLONASE) 50 MCG/ACT nasal spray Place 2 sprays into both nostrils daily.   hydroxychloroquine (PLAQUENIL) 200 MG tablet Take 2 tablets (400 mg total) by mouth daily for Discoid Lupus (L93.0)   ipratropium (ATROVENT) 0.03 % nasal spray Place 2 sprays into both nostrils every 12 (twelve) hours.   levalbuterol (XOPENEX HFA) 45 MCG/ACT inhaler Inhale 2 puffs into the lungs every 4 (four) hours as needed for wheezing.   Misc. Devices (PULSE OXIMETER DELUXE) MISC Use daily to monitor oxygen levels   Nebulizers (COMPRESSOR/NEBULIZER) MISC Supplies only  Nebulizers (COMPRESSOR/NEBULIZER) MISC Use as directed   nitroGLYCERIN (NITROSTAT) 0.4 MG SL tablet Place 1 tablet (0.4 mg total) under the tongue every 5 (five) minutes as needed for chest pain.   ondansetron (ZOFRAN) 4 MG tablet Take 1 tablet (4 mg total) by mouth every 8 (eight) hours as needed for nausea or vomiting.    OXYGEN Inhale 1 L into the lungs at bedtime.   Respiratory Therapy Supplies (FLUTTER) DEVI 1 Device by Does not apply route daily.   Spacer/Aero-Holding Chambers (AEROCHAMBER MV) inhaler Use as instructed   Spacer/Aero-Holding Chambers (AEROCHAMBER MV) inhaler Use as instructed   benzonatate (TESSALON) 100 MG capsule Take 1 capsule (100 mg total) by mouth 3 (three) times daily as needed for cough. (Patient not taking: Reported on 11/19/2021)   hydrochlorothiazide (MICROZIDE) 12.5 MG capsule Take 1 capsule (12.5 mg total) by mouth daily.   prednisoLONE acetate (PRED FORTE) 1 % ophthalmic suspension Place 1 drop into the left eye 4 (four) times daily. For at least two days and continue until symptoms improve, once symptoms improve please taper dosing, 3 times day, twice daily, once daily. Do not take with your other eye drop -Durezol (Patient not taking: Reported on 11/19/2021)   [DISCONTINUED] predniSONE (DELTASONE) 20 MG tablet Take 2 tablets (40 mg total) by mouth daily with breakfast. (Patient not taking: Reported on 11/19/2021)   No facility-administered medications prior to visit.    Review of Systems  Constitutional:  Negative for appetite change, chills, fatigue and fever.  Respiratory:  Negative for chest tightness and shortness of breath.   Cardiovascular:  Negative for chest pain and palpitations.  Gastrointestinal:  Negative for abdominal pain, nausea and vomiting.  Neurological:  Negative for dizziness and weakness.    {Labs  Heme  Chem  Endocrine  Serology  Results Review (optional):23779}   Objective    There were no vitals taken for this visit.  {Show previous vital signs (optional):23777}   Physical Exam   Awake, alert, oriented x 3. In no apparent distress   Assessment & Plan     1. Lupus ([place]) Had done well on Plaquenil but off the last year or so. Has recently started back on medication and will need to check liver functions, CBC, and EKG in a few months. She  is to schedule in-office appt in about 2 months.   2. Chronic anterior uveitis of left eye She had been followed by [facility] ophthalmology requiring steroid drops, but is scheduled to establish with local ophthalmologist here in early November. Will continue prn steroid eye drops.   3. Hyperlipidemia, unspecified hyperlipidemia type She stopped pravastatin after 2 months due to feeling dizzy and having sweats when she was taking it. Will recheck lipids at in office follow up in November and consider different statin if still elevated.      I discussed the assessment and treatment plan with the patient. The patient was provided an opportunity to ask questions and all were answered. The patient agreed with the plan and demonstrated an understanding of the instructions.   The patient was advised to call back or seek an in-person evaluation if the symptoms worsen or if the condition fails to improve as anticipated.  I provided *** minutes of non-face-to-face time during this encounter.  {provider attestation***:1}  Lelon Huh, MD [facility] [phone] (phone) [phone] (fax)  [place]

## 2021-11-23 ENCOUNTER — Other Ambulatory Visit: Payer: 59

## 2021-12-09 ENCOUNTER — Telehealth: Payer: 59 | Admitting: Physician Assistant

## 2021-12-09 DIAGNOSIS — J208 Acute bronchitis due to other specified organisms: Secondary | ICD-10-CM | POA: Diagnosis not present

## 2021-12-09 MED ORDER — AZITHROMYCIN 250 MG PO TABS: ORAL_TABLET | ORAL | 0 refills | Status: AC

## 2021-12-09 MED ORDER — PREDNISONE 20 MG PO TABS: 40.0000 mg | ORAL_TABLET | Freq: Every day | ORAL | 0 refills | Status: DC

## 2021-12-09 MED ORDER — BENZONATATE 100 MG PO CAPS: 100.0000 mg | ORAL_CAPSULE | Freq: Two times a day (BID) | ORAL | 0 refills | Status: DC | PRN

## 2021-12-09 NOTE — Progress Notes (Signed)
We are sorry that you are not feeling well.  Here is how we plan to help!  Based on your presentation I believe you most likely have A cough due to a virus.  This is called viral bronchitis and is best treated by rest, plenty of fluids and control of the cough.  You may use Ibuprofen or Tylenol as directed to help your symptoms.     In addition you may use A prescription cough medication called Tessalon Perles '100mg'$ . You may take 1-2 capsules every 8 hours as needed for your cough. I have also sent in a 5-day burst of prednisone to reduce inflammation in lungs and sinuses.   Giving you are on medications that suppress immune system, I will place an antibiotic on file at pharmacy (Azithromycin) to start and take as directed if symptoms are not starting to improve considerably in next couple of days.   From your responses in the eVisit questionnaire you describe inflammation in the upper respiratory tract which is causing a significant cough.  This is commonly called Bronchitis and has four common causes:   Allergies Viral Infections Acid Reflux Bacterial Infection Allergies, viruses and acid reflux are treated by controlling symptoms or eliminating the cause. An example might be a cough caused by taking certain blood pressure medications. You stop the cough by changing the medication. Another example might be a cough caused by acid reflux. Controlling the reflux helps control the cough.  USE OF BRONCHODILATOR ("RESCUE") INHALERS: There is a risk from using your bronchodilator too frequently.  The risk is that over-reliance on a medication which only relaxes the muscles surrounding the breathing tubes can reduce the effectiveness of medications prescribed to reduce swelling and congestion of the tubes themselves.  Although you feel brief relief from the bronchodilator inhaler, your asthma may actually be worsening with the tubes becoming more swollen and filled with mucus.  This can delay other  crucial treatments, such as oral steroid medications. If you need to use a bronchodilator inhaler daily, several times per day, you should discuss this with your provider.  There are probably better treatments that could be used to keep your asthma under control.     HOME CARE Only take medications as instructed by your medical team. Complete the entire course of an antibiotic. Drink plenty of fluids and get plenty of rest. Avoid close contacts especially the very young and the elderly Cover your mouth if you cough or cough into your sleeve. Always remember to wash your hands A steam or ultrasonic humidifier can help congestion.   GET HELP RIGHT AWAY IF: You develop worsening fever. You become short of breath You cough up blood. Your symptoms persist after you have completed your treatment plan MAKE SURE YOU  Understand these instructions. Will watch your condition. Will get help right away if you are not doing well or get worse.    Thank you for choosing an e-visit.  Your e-visit answers were reviewed by a board certified advanced clinical practitioner to complete your personal care plan. Depending upon the condition, your plan could have included both over the counter or prescription medications.  Please review your pharmacy choice. Make sure the pharmacy is open so you can pick up prescription now. If there is a problem, you may contact your provider through [employer] and have the prescription routed to another pharmacy.  Your safety is important to [place]. If you have drug allergies check your prescription carefully.   For the next  24 hours you can use MyChart to ask questions about today's visit, request a non-urgent call back, or ask for a work or school excuse. You will get an email in the next two days asking about your experience. I hope that your e-visit has been valuable and will speed your recovery.

## 2021-12-09 NOTE — Progress Notes (Signed)
I have spent 5 minutes in review of e-visit questionnaire, review and updating patient chart, medical decision making and response to patient.   Darthula Desa Cody Ebrima Ranta, PA-C    

## 2021-12-11 ENCOUNTER — Other Ambulatory Visit: Payer: Self-pay | Admitting: Family Medicine

## 2021-12-11 DIAGNOSIS — L930 Discoid lupus erythematosus: Secondary | ICD-10-CM

## 2021-12-23 ENCOUNTER — Other Ambulatory Visit: Payer: Self-pay | Admitting: Physician Assistant

## 2021-12-23 ENCOUNTER — Other Ambulatory Visit: Payer: Self-pay | Admitting: Gastroenterology

## 2021-12-23 DIAGNOSIS — H2012 Chronic iridocyclitis, left eye: Secondary | ICD-10-CM

## 2021-12-28 ENCOUNTER — Telehealth: Payer: 59 | Admitting: Family

## 2021-12-28 DIAGNOSIS — R399 Unspecified symptoms and signs involving the genitourinary system: Secondary | ICD-10-CM

## 2021-12-28 DIAGNOSIS — R109 Unspecified abdominal pain: Secondary | ICD-10-CM

## 2021-12-29 NOTE — Progress Notes (Signed)
Given your back pain and UTI symptoms, I feel your condition warrants further evaluation and I recommend that you be seen in a face to face visit, because this could be a more serious infection than a simply UTI.    NOTE: There will be NO CHARGE for this eVisit   If you are having a true medical emergency please call 911.      For an urgent face to face visit, [place] has seven urgent care centers for your convenience:     [facility] [place] [facility] [place] Get Driving Directions [phone] [address] [place], [place] [address]    [facility]) Get Driving Directions [phone] [place], [place] [address]  [facility] [place] ([place]) Get Driving Directions [phone] [address] [place],  [place]  [address]  [facility] [place] [facility] - at [address] Get Driving Directions  [phone] [address] [place],  [place] [address]   [facility] Get Driving Directions [phone] [address] [place], [place], [place] [address]   [facility] Get Driving Directions  [phone] [address] [place], [place] [address]   [facility] Get Driving Directions [phone] [address], [place],  [address]  Your MyChart E-visit questionnaire answers were reviewed by a board certified advanced clinical practitioner to complete your personal care plan based on your specific symptoms.  Thank you for using e-Visits.

## 2021-12-31 ENCOUNTER — Telehealth: Payer: Self-pay

## 2021-12-31 DIAGNOSIS — L930 Discoid lupus erythematosus: Secondary | ICD-10-CM

## 2021-12-31 DIAGNOSIS — E785 Hyperlipidemia, unspecified: Secondary | ICD-10-CM

## 2021-12-31 DIAGNOSIS — I10 Essential (primary) hypertension: Secondary | ICD-10-CM

## 2021-12-31 DIAGNOSIS — E611 Iron deficiency: Secondary | ICD-10-CM

## 2021-12-31 NOTE — Telephone Encounter (Signed)
Copied from [name] [id number]. Topic: General - Other >> Dec 28, 2021 10:42 AM Andrea [name] wrote: Pt requesting an order for labs prior to her 11-13 appt  Please assist further

## 2022-01-01 ENCOUNTER — Telehealth: Payer: Self-pay | Admitting: Adult Health

## 2022-01-01 MED ORDER — METHYLPREDNISOLONE 4 MG PO TBPK: ORAL_TABLET | ORAL | 0 refills | Status: DC

## 2022-01-01 MED ORDER — AZITHROMYCIN 250 MG PO TABS: ORAL_TABLET | ORAL | 0 refills | Status: AC

## 2022-01-01 NOTE — Telephone Encounter (Signed)
Took Covid 19 test and it was negative. Patient phone number is [phone].

## 2022-01-01 NOTE — Telephone Encounter (Signed)
Called and spoke to patient.  C/o prod cough with clear sputum, wheezing and increased SOB x2d.  Denied f/c/s or additional sx.  No recent covid test. She is test and call back with results.  Using albuterol solution Q4H, albuterol HFA BID, Breztri BID and mucinex BID.    Dr. Patsey Berthold, please advise. Thanks

## 2022-01-01 NOTE — Telephone Encounter (Signed)
Zpak and medrol has been sent to preferred pharmacy. Patient is aware and voiced her understanding.  Nothing further needed.

## 2022-01-01 NOTE — Telephone Encounter (Signed)
States COVID was negative.  Okay for a Medrol Dosepak and Azithromycin Z-Pak.  Call if no improvement in symptoms.

## 2022-01-02 ENCOUNTER — Other Ambulatory Visit: Payer: Self-pay | Admitting: Family Medicine

## 2022-01-02 MED ORDER — CLONAZEPAM 1 MG PO TABS: 1.0000 mg | ORAL_TABLET | Freq: Two times a day (BID) | ORAL | 5 refills | Status: DC | PRN

## 2022-01-02 NOTE — Telephone Encounter (Signed)
Orders placed, needs to be fasting.  

## 2022-01-09 ENCOUNTER — Ambulatory Visit (INDEPENDENT_AMBULATORY_CARE_PROVIDER_SITE_OTHER): Payer: 59 | Admitting: Family Medicine

## 2022-01-09 ENCOUNTER — Encounter: Payer: Self-pay | Admitting: Family Medicine

## 2022-01-09 VITALS — BP 112/72 | HR 69 | Resp 16 | Ht 63.0 in | Wt 156.0 lb

## 2022-01-09 DIAGNOSIS — I10 Essential (primary) hypertension: Secondary | ICD-10-CM | POA: Diagnosis not present

## 2022-01-09 DIAGNOSIS — R252 Cramp and spasm: Secondary | ICD-10-CM

## 2022-01-09 DIAGNOSIS — E611 Iron deficiency: Secondary | ICD-10-CM

## 2022-01-09 DIAGNOSIS — F410 Panic disorder [episodic paroxysmal anxiety] without agoraphobia: Secondary | ICD-10-CM

## 2022-01-09 DIAGNOSIS — E785 Hyperlipidemia, unspecified: Secondary | ICD-10-CM

## 2022-01-09 DIAGNOSIS — Z720 Tobacco use: Secondary | ICD-10-CM

## 2022-01-09 DIAGNOSIS — J0110 Acute frontal sinusitis, unspecified: Secondary | ICD-10-CM

## 2022-01-09 DIAGNOSIS — M329 Systemic lupus erythematosus, unspecified: Secondary | ICD-10-CM | POA: Diagnosis not present

## 2022-01-09 DIAGNOSIS — R0981 Nasal congestion: Secondary | ICD-10-CM

## 2022-01-09 MED ORDER — FLUTICASONE PROPIONATE 50 MCG/ACT NA SUSP: 2.0000 | Freq: Every day | NASAL | 6 refills | Status: DC

## 2022-01-09 NOTE — Progress Notes (Signed)
I,Tiffany J Bragg,acting as a scribe for Lelon Huh, MD.,have documented all relevant documentation on the behalf of Lelon Huh, MD,as directed by  Lelon Huh, MD while in the presence of Lelon Huh, MD.   Established patient visit   Patient: Andrea [name]   DOB: 1965/07/14   56 y.o. Female  MRN: [id number] Visit Date: 01/09/2022  Today's healthcare provider: Lelon Huh, MD   Chief Complaint  Patient presents with   Anxiety   Hyperlipidemia   COPD   Subjective    HPI  Anxiety/Depression, Follow-up  She was last seen for anxiety 7 months ago. Changes made at last visit include continue medications.   She reports excellent compliance with treatment. She reports excellent tolerance of treatment. She is not having side effects.   She feels her anxiety is moderate and Unchanged since last visit.  Symptoms: No chest pain No difficulty concentrating  No dizziness Yes fatigue  No feelings of losing control Yes insomnia  No irritable Yes palpitations  Yes panic attacks No racing thoughts  Yes shortness of breath No sweating  No tremors/shakes    GAD-7 Results    05/14/2021   11:42 AM  GAD-7 Generalized Anxiety Disorder Screening Tool  1. Feeling Nervous, Anxious, or on Edge 2  2. Not Being Able to Stop or Control Worrying 2  3. Worrying Too Much About Different Things 2  4. Trouble Relaxing 3  5. Being So Restless it's Hard To Sit Still 0  6. Becoming Easily Annoyed or Irritable 1  7. Feeling Afraid As If Something Awful Might Happen 0  Total GAD-7 Score 10  Difficulty At Work, Home, or Getting  Along With Others? Not difficult at all    PHQ-9 Scores    01/09/2022    9:16 AM 05/14/2021   11:40 AM 04/12/2021    9:47 AM  PHQ9 SCORE ONLY  PHQ-9 Total Score 8 12 0   Lipid/Cholesterol, Follow-up  Last lipid panel Other pertinent labs  Lab Results  Component Value Date   CHOL 205 (H) 01/30/2021   HDL 39 (L) 01/30/2021   LDLCALC 148 (H)  01/30/2021   TRIG 98 01/30/2021   CHOLHDL 5.3 (H) 01/30/2021   Lab Results  Component Value Date   ALT 17 07/13/2021   AST 19 07/13/2021   PLT 259 07/13/2021   TSH 2.800 06/08/2019     She was last seen for this 7 months ago.  Management since that visit includes continue medications.  She reports excellent compliance with treatment. She is not having side effects.   Symptoms: No chest pain No chest pressure/discomfort  Yes dyspnea No lower extremity edema  No numbness or tingling of extremity No orthopnea  Yes palpitations No paroxysmal nocturnal dyspnea  No speech difficulty No syncope   Current diet: well balanced Current exercise: walking  The 10-year ASCVD risk score (Arnett DK, et al., 2019) is: 8.2%  ---------------------------------------------------------------------------------------------------  COPD, Follow up  She was last seen for this 7 months ago. Changes made include no changes.   She reports excellent compliance with treatment. She is not having side effects.  she uses rescue inhaler 2 per  day . She IS experiencing SOB. she reports breathing is Unchanged.  She reports she has cut down to 1/2 ppd and working on quitting completely.   She does report having persistent sinus pressure and congestion with clear nasal discharge for the last week.   -----------------------------------------------------------------------------------------   She is also here to  follow up on discoid lupus, now back on Plaquenil which she is doing much better. Is scheduled to have dilated eye exam next week.  ---------------------------------------------------------------------------------------------------   Medications: Outpatient Medications Prior to Visit  Medication Sig   albuterol (PROVENTIL) (2.5 MG/3ML) 0.083% nebulizer solution USE 1 VIAL IN NEBULIZER EVERY 4 (FOUR) HOURS AS NEEDED FOR WHEEZING OR SHORTNESS OF BREATH.   ALPRAZolam (XANAX) 0.5 MG tablet Take 1  tablet (0.5 mg total) by mouth every 8 (eight) hours as needed. May take between doses of clonazepam as needed.   Budeson-Glycopyrrol-Formoterol (BREZTRI AEROSPHERE) 160-9-4.8 MCG/ACT AERO Inhale 2 puffs into the lungs in the morning and at bedtime.   clonazePAM (KLONOPIN) 1 MG tablet Take 1 tablet (1 mg total) by mouth 2 (two) times daily as needed.   dexlansoprazole (DEXILANT) 60 MG capsule TAKE 1 CAPSULE BY MOUTH EVERY DAY   hydroxychloroquine (PLAQUENIL) 200 MG tablet TAKE 2 TABLETS (400 MG TOTAL) BY MOUTH DAILY FOR DISCOID LUPUS (L93.0)   ipratropium (ATROVENT) 0.03 % nasal spray Place 2 sprays into both nostrils every 12 (twelve) hours.   levalbuterol (XOPENEX HFA) 45 MCG/ACT inhaler Inhale 2 puffs into the lungs every 4 (four) hours as needed for wheezing.   methylPREDNISolone (MEDROL DOSEPAK) 4 MG TBPK tablet Use as directed   Misc. Devices (PULSE OXIMETER DELUXE) MISC Use daily to monitor oxygen levels   Nebulizers (COMPRESSOR/NEBULIZER) MISC Supplies only   Nebulizers (COMPRESSOR/NEBULIZER) MISC Use as directed   nitroGLYCERIN (NITROSTAT) 0.4 MG SL tablet Place 1 tablet (0.4 mg total) under the tongue every 5 (five) minutes as needed for chest pain.   ondansetron (ZOFRAN) 4 MG tablet Take 1 tablet (4 mg total) by mouth every 8 (eight) hours as needed for nausea or vomiting.   OXYGEN Inhale 1 L into the lungs at bedtime.   prednisoLONE acetate (PRED FORTE) 1 % ophthalmic suspension PLACE 1 DROP INTO THE LEFT EYE 4 (FOUR) TIMES DAILY. FOR AT LEAST TWO DAYS AND CONTINUE UNTIL SYMPTOMS IMPROVE, ONCE SYMPTOMS IMPROVE PLEASE TAPER DOSING, 3 TIMES DAY, TWICE DAILY, ONCE DAILY. DO NOT TAKE WITH YOUR OTHER EYE DROP -DUREZOL   Respiratory Therapy Supplies (FLUTTER) DEVI 1 Device by Does not apply route daily.   Spacer/Aero-Holding Chambers (AEROCHAMBER MV) inhaler Use as instructed   Spacer/Aero-Holding Chambers (AEROCHAMBER MV) inhaler Use as instructed   benzonatate (TESSALON) 100 MG capsule Take  1 capsule (100 mg total) by mouth 2 (two) times daily as needed for cough. (Patient not taking: Reported on 01/09/2022)   fluticasone (FLONASE) 50 MCG/ACT nasal spray Place 2 sprays into both nostrils daily. (Patient not taking: Reported on 01/09/2022)   hydrochlorothiazide (MICROZIDE) 12.5 MG capsule Take 1 capsule (12.5 mg total) by mouth daily.   No facility-administered medications prior to visit.    Review of Systems  Constitutional:  Positive for fatigue. Negative for appetite change, chills and fever.  Respiratory:  Positive for cough. Negative for chest tightness and shortness of breath.   Cardiovascular:  Negative for chest pain and palpitations.  Gastrointestinal:  Negative for abdominal pain, nausea and vomiting.  Neurological:  Negative for dizziness and weakness.       Objective    BP 112/72 (BP Location: Right Arm, Patient Position: Sitting, Cuff Size: Normal)   Pulse 69   Resp 16   Ht '5\' 3"'$  (1.6 m)   Wt 156 lb (70.8 kg)   SpO2 100%   BMI 27.63 kg/m    Physical Exam    General: Appearance:  Well developed, well nourished female in no acute distress  Eyes:    PERRL, conjunctiva/corneas clear, EOM's intact       HEENT:   Nasal turbinates congested and pale.   Lungs:     Clear to auscultation bilaterally, respirations unlabored  Heart:    Normal heart rate. Normal rhythm. No murmurs, rubs, or gallops.    MS:   All extremities are intact.    Neurologic:   Awake, alert, oriented x 3. No apparent focal neurological defect.         Assessment & Plan     1. Lupus ([place]) Doing well since starting back on Plaquenil. Not currently followed by rheumatology.  - TSH  2. Essential (primary) hypertension Well controlled.  Continue current medications.    3. Iron deficiency  - Iron, TIBC and Ferritin Panel - CBC with Differential/Platelet  4. Hyperlipidemia, unspecified hyperlipidemia type She is tolerating atorvastatin well with no adverse effects.   - Lipid  panel  5. Panic disorder Fairly well controlled on current medications.   6. Leg cramp  - Magnesium  7. Tobacco abuse  - Ambulatory Referral Lung Cancer Screening [facility]  Refill fluticasone (FLONASE) 50 MCG/ACT nasal spray; Place 2 sprays into both nostrils daily.  Dispense: 16 g; Refill: 6      The entirety of the information documented in the History of Present Illness, Review of Systems and Physical Exam were personally obtained by me. Portions of this information were initially documented by the CMA and reviewed by me for thoroughness and accuracy.     Lelon Huh, MD  [facility] [phone] (phone) [phone] (fax)  [place]

## 2022-01-10 LAB — IRON,TIBC AND FERRITIN PANEL
Ferritin: 19 ng/mL (ref 15–150)
Iron Saturation: 18 % (ref 15–55)
Iron: 85 ug/dL (ref 27–159)
Total Iron Binding Capacity: 475 ug/dL — ABNORMAL HIGH (ref 250–450)
UIBC: 390 ug/dL (ref 131–425)

## 2022-01-10 LAB — CBC WITH DIFFERENTIAL/PLATELET
Basophils Absolute: 0.1 10*3/uL (ref 0.0–0.2)
Basos: 2 %
EOS (ABSOLUTE): 0.1 10*3/uL (ref 0.0–0.4)
Eos: 1 %
Hematocrit: 43.8 % (ref 34.0–46.6)
Hemoglobin: 14.3 g/dL (ref 11.1–15.9)
Immature Grans (Abs): 0 10*3/uL (ref 0.0–0.1)
Immature Granulocytes: 0 %
Lymphocytes Absolute: 2.4 10*3/uL (ref 0.7–3.1)
Lymphs: 30 %
MCH: 26.1 pg — ABNORMAL LOW (ref 26.6–33.0)
MCHC: 32.6 g/dL (ref 31.5–35.7)
MCV: 80 fL (ref 79–97)
Monocytes Absolute: 0.6 10*3/uL (ref 0.1–0.9)
Monocytes: 8 %
Neutrophils Absolute: 4.8 10*3/uL (ref 1.4–7.0)
Neutrophils: 59 %
Platelets: 330 10*3/uL (ref 150–450)
RBC: 5.47 x10E6/uL — ABNORMAL HIGH (ref 3.77–5.28)
RDW: 14 % (ref 11.7–15.4)
WBC: 8 10*3/uL (ref 3.4–10.8)

## 2022-01-10 LAB — MAGNESIUM: Magnesium: 2.3 mg/dL (ref 1.6–2.3)

## 2022-01-10 LAB — LIPID PANEL
Chol/HDL Ratio: 4.7 ratio — ABNORMAL HIGH (ref 0.0–4.4)
Cholesterol, Total: 204 mg/dL — ABNORMAL HIGH (ref 100–199)
HDL: 43 mg/dL (ref >39)
LDL Chol Calc (NIH): 139 mg/dL — ABNORMAL HIGH (ref 0–99)
Triglycerides: 119 mg/dL (ref 0–149)
VLDL Cholesterol Cal: 22 mg/dL (ref 5–40)

## 2022-01-10 LAB — TSH: TSH: 1.78 u[IU]/mL (ref 0.450–4.500)

## 2022-01-11 ENCOUNTER — Telehealth: Payer: Self-pay

## 2022-01-11 NOTE — Telephone Encounter (Signed)
Labs awaiting provider review.

## 2022-01-11 NOTE — Telephone Encounter (Signed)
Copied from [place] [id number]. Topic: General - Other >> Jan 11, 2022  9:06 AM Andrea [name] wrote: Reason for CRM: The patient would like to be contacted by a member of clinical staff when possible to review their labs from 01/09/22  Please contact further when possible

## 2022-01-14 ENCOUNTER — Telehealth: Payer: Self-pay | Admitting: *Deleted

## 2022-01-14 ENCOUNTER — Ambulatory Visit: Payer: 59 | Admitting: Family Medicine

## 2022-01-14 ENCOUNTER — Encounter: Payer: Self-pay | Admitting: *Deleted

## 2022-01-14 DIAGNOSIS — E785 Hyperlipidemia, unspecified: Secondary | ICD-10-CM

## 2022-01-14 MED ORDER — ATORVASTATIN CALCIUM 20 MG PO TABS: 20.0000 mg | ORAL_TABLET | Freq: Every day | ORAL | 3 refills | Status: DC

## 2022-01-14 NOTE — Addendum Note (Signed)
Addended by: Lelon Huh E on: 01/14/2022 11:48 AM   Modules accepted: Orders

## 2022-01-14 NOTE — Telephone Encounter (Signed)
Reviewed message from Dr. Caryn Section from 01/14/22 today and patient verbalized understanding to start atorvastatin , take OTC Co Q 10 100 mg once a day and vit ,D3 1000 units once a day to help medication work better and prevent muscle pains from cholesterol medication. F/u appt scheduled for 04/17/22. Patient aware. No further questions from patient at this time.

## 2022-01-14 NOTE — Telephone Encounter (Signed)
Left message for patient to call back regarding recommendations.  Appointment scheduled.

## 2022-01-14 NOTE — Telephone Encounter (Signed)
This encounter was created in error - please disregard.

## 2022-01-14 NOTE — Telephone Encounter (Signed)
It would probably be a good idea to start cholesterol medication. Her levels are borderline, but considering her family history, a cholesterol medication would make it much less likely for her to develop heart disease.  Have sent prescription for atorvastatin to [facility].  She should also take OTC Coq 10, '100mg'$  once a day and vitamin D3 1,000 units once a day to help medication work better and prevent muscle pains from the cholesterol medication.  Please schedule a follow up in 3 months to check labs.

## 2022-01-14 NOTE — Telephone Encounter (Signed)
Patient notified of last provider comment:  Your cholesterol is a little high at 204. It should be under 200. Avoid saturated fats such as red meats and pork in your diet. The rest of you blood is normal. We need to check your cholesterol at lest once every year.   Patient has several concerns: Does she need to be on cholesterol medication- she has strong family history of heart disease and wants to know if medication would be helpful. Patient still has leg cramps- what to do for them? Concerned about iron testing and abnormal value- afraid to become anemic again- just wants to make sure that is not an issue. ( Patient reassured all but one level is normal, HGB is normal- provider will review per patient concerns.

## 2022-01-17 ENCOUNTER — Encounter: Payer: Self-pay | Admitting: Gastroenterology

## 2022-01-17 ENCOUNTER — Ambulatory Visit (INDEPENDENT_AMBULATORY_CARE_PROVIDER_SITE_OTHER): Payer: 59 | Admitting: Gastroenterology

## 2022-01-17 VITALS — BP 134/81 | HR 91 | Temp 98.3°F | Ht 63.0 in | Wt 159.0 lb

## 2022-01-17 DIAGNOSIS — R1310 Dysphagia, unspecified: Secondary | ICD-10-CM

## 2022-01-17 NOTE — Progress Notes (Signed)
Primary Care Physician: Birdie Sons, MD  Primary Gastroenterologist:  Dr. Lucilla Lame  Chief Complaint  Patient presents with   Follow-up   Gastroesophageal Reflux    Pt reports difficulty breathing 45 mins-1hr after eating x 3-4 months   Dysphagia    Pt reports also burping and has had water come back up after drinking    HPI: Andrea [name] is a 56 y.o. female here for history of GERD.  The patient has been helped by Dexilant and we have had to do prior authorizations previously to get her the medication.  The patient does have some intermittent acid breakthrough but is doing well on the Dexilant. The patient reports that after she each she becomes short of breath.  She states it only happens after she eats.  There is no report of any unexplained weight loss fevers chills nausea vomiting black stools or bloody stools.  Past Medical History:  Diagnosis Date   Anxiety    panic attacks, chest pain   Arthritis    COPD (chronic obstructive pulmonary disease) (HCC)    GERD (gastroesophageal reflux disease)    Hypertension    not medicated   Non-obstructive CAD in native artery    a. cardiac cath 01/2014: showed minor irregularities, mildly elevated left ventricular end-diastolic pressure and normal ejection fraction; b. 03/2015 MV: low risk.   Palpitations    a. 08/2016 Event Monitor: occas PVC's, two brief runs of SVT (4 and 7 beats).   PONV (postoperative nausea and vomiting)    Symptomatic PVC's (premature ventricular contractions)    Tobacco abuse    Wears dentures    partial upper and lower    Current Outpatient Medications  Medication Sig Dispense Refill   albuterol (PROVENTIL) (2.5 MG/3ML) 0.083% nebulizer solution USE 1 VIAL IN NEBULIZER EVERY 4 (FOUR) HOURS AS NEEDED FOR WHEEZING OR SHORTNESS OF BREATH. 120 mL 11   ALPRAZolam (XANAX) 0.5 MG tablet Take 1 tablet (0.5 mg total) by mouth every 8 (eight) hours as needed. May take between doses of clonazepam as  needed. 30 tablet 1   atorvastatin (LIPITOR) 20 MG tablet Take 1 tablet (20 mg total) by mouth daily. 90 tablet 3   Budeson-Glycopyrrol-Formoterol (BREZTRI AEROSPHERE) 160-9-4.8 MCG/ACT AERO Inhale 2 puffs into the lungs in the morning and at bedtime. 10.7 g 5   clonazePAM (KLONOPIN) 1 MG tablet Take 1 tablet (1 mg total) by mouth 2 (two) times daily as needed. 60 tablet 5   dexlansoprazole (DEXILANT) 60 MG capsule TAKE 1 CAPSULE BY MOUTH EVERY DAY 90 capsule 0   fluticasone (FLONASE) 50 MCG/ACT nasal spray Place 2 sprays into both nostrils daily. 16 g 6   hydroxychloroquine (PLAQUENIL) 200 MG tablet TAKE 2 TABLETS (400 MG TOTAL) BY MOUTH DAILY FOR DISCOID LUPUS (L93.0) 60 tablet 5   ipratropium (ATROVENT) 0.03 % nasal spray Place 2 sprays into both nostrils every 12 (twelve) hours. 30 mL 0   levalbuterol (XOPENEX HFA) 45 MCG/ACT inhaler Inhale 2 puffs into the lungs every 4 (four) hours as needed for wheezing. 1 each 11   methylPREDNISolone (MEDROL DOSEPAK) 4 MG TBPK tablet Use as directed 21 each 0   Misc. Devices (PULSE OXIMETER DELUXE) MISC Use daily to monitor oxygen levels 1 each 0   Nebulizers (COMPRESSOR/NEBULIZER) MISC Supplies only 1 each 1   Nebulizers (COMPRESSOR/NEBULIZER) MISC Use as directed 1 each 0   nitroGLYCERIN (NITROSTAT) 0.4 MG SL tablet Place 1 tablet (0.4 mg total) under the  tongue every 5 (five) minutes as needed for chest pain. 25 tablet 0   ondansetron (ZOFRAN) 4 MG tablet Take 1 tablet (4 mg total) by mouth every 8 (eight) hours as needed for nausea or vomiting. 30 tablet 0   OXYGEN Inhale 1 L into the lungs at bedtime.     prednisoLONE acetate (PRED FORTE) 1 % ophthalmic suspension PLACE 1 DROP INTO THE LEFT EYE 4 (FOUR) TIMES DAILY. FOR AT LEAST TWO DAYS AND CONTINUE UNTIL SYMPTOMS IMPROVE, ONCE SYMPTOMS IMPROVE PLEASE TAPER DOSING, 3 TIMES DAY, TWICE DAILY, ONCE DAILY. DO NOT TAKE WITH YOUR OTHER EYE DROP -DUREZOL 5 mL 1   Respiratory Therapy Supplies (FLUTTER) DEVI 1  Device by Does not apply route daily. 1 each 0   Spacer/Aero-Holding Chambers (AEROCHAMBER MV) inhaler Use as instructed 1 each 0   Spacer/Aero-Holding Chambers (AEROCHAMBER MV) inhaler Use as instructed 1 each 0   hydrochlorothiazide (MICROZIDE) 12.5 MG capsule Take 1 capsule (12.5 mg total) by mouth daily. 90 capsule 3   No current facility-administered medications for this visit.    Allergies as of 01/17/2022 - Review Complete 01/17/2022  Allergen Reaction Noted   Alum & mag hydroxide-simeth Diarrhea and Nausea Only 11/02/2014   Aspirin  07/23/2018   Bactrim [sulfamethoxazole-trimethoprim] Itching 01/17/2020   Cefdinir Other (See Comments) 07/06/2013   Chantix [varenicline]  06/11/2016   Doxycycline Other (See Comments) 11/18/2013   Multaq [dronedarone] Other (See Comments) 07/22/2017   Ivp dye [iodinated contrast media] Palpitations 05/20/2013    ROS:  General: Negative for anorexia, weight loss, fever, chills, fatigue, weakness. ENT: Negative for hoarseness, difficulty swallowing , nasal congestion. CV: Negative for chest pain, angina, palpitations, dyspnea on exertion, peripheral edema.  Respiratory: Negative for dyspnea at rest, dyspnea on exertion, cough, sputum, wheezing.  GI: See history of present illness. GU:  Negative for dysuria, hematuria, urinary incontinence, urinary frequency, nocturnal urination.  Endo: Negative for unusual weight change.    Physical Examination:   BP 134/81 (BP Location: Left Arm, Patient Position: Sitting, Cuff Size: Normal)   Pulse 91   Temp 98.3 F (36.8 C) (Oral)   Ht '5\' 3"'$  (1.6 m)   Wt 159 lb (72.1 kg)   BMI 28.17 kg/m   General: Well-nourished, well-developed in no acute distress.  Eyes: No icterus. Conjunctivae pink. Neuro: Alert and oriented x 3.  Grossly intact. Skin: Warm and dry, no jaundice.   Psych: Alert and cooperative, normal mood and affect.  Labs:    Imaging Studies: No results found.  Assessment and Plan:    Andrea [name] is a 56 y.o. y/o female who comes in today with a history of shortness of breath after eating and is wondering if her hiatal hernia has gotten worse or may be causing her issues.  The patient will be set up for a barium swallow to see if there is any sign of a narrowing or worsening of her hiatal hernia and this may also give insight into whether she is having reflux of the barium up towards her upper esophagus and lungs.  The patient has been spent plan and agrees with it.     Lucilla Lame, MD. [name]    Note: This dictation was prepared with Dragon dictation along with smaller phrase technology. Any transcriptional errors that result from this process are unintentional.

## 2022-01-28 ENCOUNTER — Other Ambulatory Visit: Payer: Self-pay | Admitting: Gastroenterology

## 2022-01-28 ENCOUNTER — Ambulatory Visit
Admission: RE | Admit: 2022-01-28 | Discharge: 2022-01-28 | Disposition: A | Discharge status: Home / Self Care | Payer: 59 | Source: Ambulatory Visit | Attending: Gastroenterology | Admitting: Gastroenterology

## 2022-01-28 DIAGNOSIS — R1310 Dysphagia, unspecified: Secondary | ICD-10-CM | POA: Insufficient documentation

## 2022-01-29 ENCOUNTER — Encounter: Payer: Self-pay | Admitting: Gastroenterology

## 2022-01-29 DIAGNOSIS — K219 Gastro-esophageal reflux disease without esophagitis: Secondary | ICD-10-CM

## 2022-01-30 ENCOUNTER — Other Ambulatory Visit: Payer: Self-pay

## 2022-01-30 ENCOUNTER — Other Ambulatory Visit: Payer: 59

## 2022-01-30 ENCOUNTER — Encounter: Payer: Self-pay | Admitting: Surgery

## 2022-01-30 ENCOUNTER — Ambulatory Visit (INDEPENDENT_AMBULATORY_CARE_PROVIDER_SITE_OTHER): Payer: 59 | Admitting: Surgery

## 2022-01-30 VITALS — BP 128/83 | HR 87 | Temp 98.0°F | Ht 63.0 in | Wt 161.0 lb

## 2022-01-30 DIAGNOSIS — K219 Gastro-esophageal reflux disease without esophagitis: Secondary | ICD-10-CM

## 2022-01-30 MED ORDER — PREDNISONE 50 MG PO TABS: ORAL_TABLET | ORAL | 0 refills | Status: DC

## 2022-01-30 NOTE — Patient Instructions (Addendum)
We will send a message to Dr Dorothey Baseman office for them to get you set up for an Upper Endoscopy exam prior to surgery. Their office will call you about this.   We will send a request for surgery clearance to Dr Vernard Gambles.   We will get you scheduled for a CT scan with oral contrast of your abdomen to assess your anatomy.   We will have you follow up here once we get the results of these exams to talk about scheduling surgery.   You are scheduled for a CT scan at [facility] entrance on December 13th. You will need to arrive there by 8:00 am and have nothing to eat or drink for 4 hours prior.     We have spoken today about repairing your Hiatal Hernia. Your surgery will be scheduled at [facility] with Dr. Dahlia Byes.  Plan to be in the hospital for 1-2 days if the minimally invasive surgery is completed without having to make a bigger incision. If the bigger incision is made, you will most likely need to be in the hospital 4-6 days. You will be on a soft diet and need to recover for 2 weeks following your surgery prior to doing any of your normal activities. At the 2 week mark, we will see you in the office and if you are doing ok we will advance your diet and activity level as you tolerate.  You will need to arrange to be out of work for approximately 1-2 weeks and then you may return with a lifting restriction for 4 more weeks. If you have FMLA or Disability paperwork that needs to be filled out, please have your company fax your paperwork to [phone] or you may drop this by either office. This paperwork will be filled out within 3 days after your surgery has been completed.  Please call our office with any questions or concerns that you have regarding your surgery and recovery.  Laparoscopic Nissen Fundoplication Laparoscopic Nissen fundoplication is surgery to relieve heartburn and other problems caused by gastric fluids flowing up into your esophagus. The esophagus is the tube that  carries food and liquid from your throat to your stomach. Normally, the muscle that sits between your stomach and your esophagus (lower esophageal sphincter or LES) keeps stomach fluids in your stomach. In some people, the LES does not work properly, and stomach fluids flow up into the esophagus. This can happen when part of the stomach bulges through the LES (hiatal hernia). The backward flow of stomach fluids can cause a type of severe and long-standing heartburn that is called gastroesophageal reflux disease (GERD). You may need this surgery if other treatments for GERD have not helped. In a laparoscopic Nissen fundoplication, the upper part of your stomach is wrapped around the lower part of your esophagus to strengthen the LES and prevent reflux. If you have a hiatal hernia, it will also be repaired with this surgery. The procedure is done through several small incisions in your abdomen. It is performed using a thin, telescopic instrument (laparoscope) and other instruments that can pass through the scope or through other small incisions. Tell a health care provider about: Any allergies you have. All medicines you are taking, including vitamins, herbs, eye drops, creams, and over-the-counter medicines. Any problems you or family members have had with anesthetic medicines. Any blood disorders you have. Any surgeries you have had. Any medical conditions you have. What are the risks? Generally, this is a safe procedure. However,  problems may occur, including: Difficulty swallowing (dysphagia). Bloating. Nausea or vomiting. Damage to the lung, causing a collapsed lung. Infection or bleeding. What happens before the procedure? Ask your health care provider about: Changing or stopping your regular medicines. This is especially important if you are taking diabetes medicines or blood thinners. Taking medicines such as aspirin and ibuprofen. These medicines can thin your blood. Do not take these  medicines before your procedure if your health care provider asks you not to. Follow your health care provider's instructions about eating or drinking restrictions. Plan to have someone take you home after the procedure. What happens during the procedure? An IV tube will be inserted into one of your veins. It will be used to give you fluids and medicines during the procedure. You will be given a medicine that makes you fall asleep (general anesthetic). Your abdomen will be cleaned with a germ-killing solution (antiseptic). The surgeon will make a small incision in your abdomen and insert a tube through the incision. Your abdomen will be filled with a gas. This helps the surgeon to see your organs more easily and it makes more space to work. The surgeon will insert the laparoscope through the incision. The scope has a camera that will send pictures to a monitor in the operating room. The surgeon will make several other small incisions in your abdomen to insert the other instruments that are needed during the procedure. Another instrument (dilator) will be passed through your mouth and down your esophagus into the upper part of your stomach. The dilator will prevent your LES from being closed too tightly during surgery. The surgeon will pass the top portion of your stomach behind the lower part of your esophagus and wrap it all the way around. This will be stitched into place. If you have a hiatal hernia, it will be repaired during this procedure. All instruments will be removed, and your incisions will be closed under your skin with stitches (sutures). Skin adhesive strips may also be used. A bandage (dressing) will be placed on your skin over the incisions. The procedure may vary among health care providers and hospitals. What happens after the procedure? You will be moved to a recovery area. Your blood pressure, heart rate, breathing rate, and blood oxygen level will be monitored often until the  medicines you were given have worn off. You will be given pain medicine as needed. Your IV tube will be kept in until you are able to drink fluids. This information is not intended to replace advice given to you by your health care provider. Make sure you discuss any questions you have with your health care provider. Document Released: 03/11/2014 Document Revised: 07/27/2015 Document Reviewed: 10/20/2013 Elsevier Interactive Patient Education  2017 [employer]   Laparoscopic Nissen Fundoplication, Care After Refer to this sheet in the next few weeks. These instructions provide you with information about caring for yourself after your procedure. Your health care provider may also give you more specific instructions. Your treatment has been planned according to current medical practices, but problems sometimes occur. Call your health care provider if you have any problems or questions after your procedure. What can I expect after the procedure? After the procedure, it is common to have: Difficulty swallowing (dysphagia). Excess gas (bloating). Follow these instructions at home: Medicines  Take medicines only as directed by your health care provider. Do not drive or operate heavy machinery while taking pain medicine. Incision care  There are many different ways to  close and cover an incision, including stitches (sutures), skin glue, and adhesive strips. Follow your health care provider's instructions about: Incision care. Bandage (dressing) changes and removal. Incision closure removal. Check your incision areas every day for signs of infection. Watch for: Redness, swelling, or pain. Fluid, blood, or pus. Do not take baths, swim, or use a hot tub until your health care provider approves. Take showers as directed by your health care provider. Eating and drinking  Follow your health care provider's instructions about eating. You may need to follow a very soft diet for 2 weeks, followed by  a diet of more regular foods for 2 weeks, no breads. You should return to your usual diet gradually. Drink enough fluid to keep your urine clear or pale yellow. Activity  Return to your normal activities as directed by your health care provider. Ask your health care provider what activities are safe for you. Avoid strenuous exercise. Do not lift anything that is heavier than 10 lb (4.5 kg). Ask your health care provider when you can: Return to sexual activity. Drive. Go back to work. Contact a health care provider if: You have a fever. Your pain gets worse or is not helped by medicine. You have frequent nausea or vomiting. You have continued abdominal bloating. You have an ongoing (persistent) cough. You have redness, swelling, or pain in any incision areas. You have fluid, blood, or pus coming from any incisions. Get help right away if: You have trouble breathing. You are unable to swallow. You have persistent vomiting. You have blood in your vomit. You have severe abdominal pain. This information is not intended to replace advice given to you by your health care provider. Make sure you discuss any questions you have with your health care provider. Document Released: 10/12/2003 Document Revised: 07/27/2015 Document Reviewed: 10/20/2013 Elsevier Interactive Patient Education  2017 [employer].   Diet After Nissen Fundoplication Surgery This diet information is for patients who have recently had Nissen fundoplication surgery to correct reflux disease or to repair various types of hernias, such as hiatal hernia and intrathoracic stomach. This diet may also be used for other gastrointestinal surgeries, such as Heller myotomy and repair of achalasia. The diet will help control diarrhea, excess gas and swallowing problems, which may occur after this type of surgery. Keeping Your Stomach from Stretching Eat small, frequent meals (six to eight per day). This will help you consume the  majority of the nutrients you need without causing your stomach to feel full or distended.  Drinking large amounts of fluids with meals can stretch your stomach. You may drink fluids between meals as often as you like, but limit fluids to 1/2 cup (4 fluid ounces) with meals and one cup (8 fluid ounces) with snacks.  Sit upright while eating and stay upright for 30 minutes after each meal. Gravity can help food move through your digestive tract. Do not lie down after eating. Sit upright for 2 hours after your last meal or snack of the day.  Eat very slowly. Take your time when eating.  Take small bites and chew your food well to help aid in swallowing and digestion.  Avoid crusty breads and sticky, gummy foods, such as bananas, fresh doughy breads, rolls and doughnuts. These types of foods become sticky and difficult to swallow.  Toasted breads tend to be better tolerated.  Lastly, if you eat sweets, consume them at the end of your meal to avoid a group of symptoms referred to as "  dumping syndrome". This describes the rapid emptying of foods from the stomach to the small intestine. Sweetened beverages, candy and desserts move more rapidly and dump quickly into the intestines. This can cause symptoms of nausea, weakness, cold sweats, cramps, diarrhea and dizzy spells.  Avoiding Gas Avoid drinking through a straw. Do not chew gum or tobacco. These actions cause you to swallow air, which produces excess gas in your stomach. Chew with your mouth closed.  Avoid any foods that cause stomach gas and distention. These foods include corn, dried beans, peas, lentils, onions, broccoli, cauliflower and any food from the cabbage family.  Avoid carbonated drinks, alcohol, citrus and tomato products.  When will I be able to eat a soft diet? After Nissen fundoplication surgery, your diet will be advanced slowly by your surgeon. Generally, you will be on a clear liquid diet for the first few meals. Then you will  advance to the full liquid diet for a meal or two and eventually to a Nissen soft diet. Please be aware that each patient's tolerance to food is different. Your doctor will advance your diet depending on how well you progress after surgery. Clear Liquid Diet  The first diet after surgery is the clear liquid diet. It includes the following liquids: Apple juice  Cranberry juice  Grape juice  Chicken broth  Beef broth  Flavored gelatin (Jell-O)  Decaf tea and coffee  Caffeinated beverages are permitted based on tolerance  Popsicles  [place] ice Carbonated drinks (sodas) are not allowed for the first six to eight weeks after surgery. After this time you can try them again in small amounts.  Full Liquid Diet The full liquid diet contains anything on the clear liquid diet, plus: Milk, soy, rice and almond (no chocolate)  Cream of wheat, cream of rice, grits  Strained creamed soups (no tomato or broccoli)  Vanilla and strawberry-flavored ice cream  Sherbet  Blended, custard styled or whipped yogurt (plain or vanilla only)  Vanilla and butterscotch pudding (no chocolate or coconut)  Nutritional drinks including Ensure, Boost, Carnation Instant Breakfast (no chocolate-flavored) Note: Dairy products, such as milk, ice cream and pudding, may cause diarrhea in some people just after surgery. You may need to avoid milk products. If so, substitute them with lactose-free beverages, such as soy, rice, Lactaid or almond milks.  Nissen Soft Diet Food Category Foods to Choose Foods to Avoid  Beverages Milk, such as, whole, 2%, 1%, non-fat, or skim, soy, rice, almond  Caffeinated and decaf tea and coffee  Powdered drink mixes (in moderation)  Non-citrus juices (apple, grape, cranberry or blends of these)  Fruit nectars  Nutritional drinks including Boost, Ensure, Carnation Instant Breakfast Chocolate milk, cocoa or other chocolate-flavored drinks  Carbonated drinks  Alcohol  Citrus juices  like orange, grapefruit, lemon and lime  Breads Pancakes, [place] toast and waffles  Crackers (saltine, butter, soda, graham, Goldfish and Cheese Nips)  Toasted bread Untoasted bread, bagels, [place] and hard rolls, English muffins  Crackers with nuts, seeds, fresh or dried fruit, coconut, or highly seasoned, such as garlic or onion-flavored  Sweet rolls, coffee cake or doughnuts  Cereals Well cooked cereals, such as oatmeal (plain or flavored)  Cold cereal (Cornflakes, Rice Krispies, Cheerios, Special K plain, Rice Chex and puffed rice) Very coarse cereal, such as bran, shredded wheat  Any cereal with fresh or dried fruit, coconut, seeds or nuts  Desserts Eat in moderation and do not eat desserts or sweets by themselves. Plain cakes, cookies and  cream-filled pies  Vanilla and butterscotch pudding or custard  Ice cream, ice milk, frozen yogurt and sherbet  Gelatin made from allowed foods  Fruit ices and popsicles Desserts containing chocolate, coconut, nuts, seeds, fresh or dried fruit, peppermint or spearmint  Eggs  Poached, hard boiled or scrambled Fried eggs and highly seasoned eggs (deviled eggs)  Fats Eat in moderation. Butter and margarine  Mayonnaise and vegetable oils  Mildly seasoned cream sauces and gravies  Plain cream cheese  Sour cream Highly seasoned salad dressings, cream sauces and gravies  Bacon, bacon fat, ham fat, lard and salt pork  Fried foods  Nuts  Fruits Fruit juice  Any canned or cooked fruit except those listed in the AVOID column ALL fresh fruits, such as citrus, bananas and pineapple  Canned pineapple  Dried fruits, such as raisins, berries  Fruits with seeds, such as berries, kiwi and figs  Meat, Fish, Poultry, and [employer] may be ground, minced or chopped to ease swallowing and digestion  Tender, well cooked and moist cuts of beef, chicken, [place] and pork  Veal and lamb  Flaky, cooked fish  Canned tuna  Cottage and ricotta cheeses   Mild cheese, such as American, brick, mozzarella and baby Swiss  Creamy peanut butter  Plain custard or blended fruit yogurt  Moist casseroles, such as macaroni & cheese, tuna noodle  Grilled or toasted cheese sandwich Tough meats with a lot of gristle  Fried, highly seasoned, smoked and fatty meat, fish or poultry, such as frankfurters, luncheon meats, sausage, bacon, spare ribs, beef brisket, sardines, anchovies, duck and goose  Chili and other entrees made with pepper or chili pepper  Shellfish  Strongly flavored cheeses, such as sharp cheese, extra sharp cheddar, cheese containing peppers or other seasonings  Crunchy peanut butter  Any yogurt with nuts, seeds, coconut, strawberries or raspberries  Potatoes and Starches Peeled, mashed or boiled white or sweet potatoes  Oven-baked potatoes without skin  Well cooked white rice, enriched noodles, barley, spaghetti, macaroni and other pastas Fried potatoes, potato skins and potato chips  Hard and soft taco shells  Fried, brown or wild rice  Soups Mildly flavored meat stocks  Cream soups made from allowed foods Highly seasoned soups and tomato based soups, cream soups made with gas producing vegetables, such as broccoli, cauliflower, onion, etc.  Sweets and Snacks Use in moderation and do not eat large amounts of sweets by themselves. Syrup, honey, jelly and seedless jam  Plain hard candies and plain candies made with allowed ingredients  Molasses  Marshmallows  Other candy made from allowed ingredients  Thin pretzels Jam, marmalade and preserves  Chocolate in any form  Any candy containing nuts, coconut, seeds, peppermint, spearmint or dried or fresh fruit  Popcorn, potato chips, tortilla chips  Soft or hard thick pretzels, such as sourdough  Vegetables Well cooked soft vegetables without seeds or skins, such as asparagus tips, beets, carrots, green and wax beans, chopped spinach, tender canned baby peas, squash and pumpkin Raw  vegetables, tomatoes, tomato juice, tomato sauce and V-8 juice  Gas producing vegetables, such as broccoli, Brussel sprouts, cabbage, cauliflower, onions, corn, cucumber, green peppers, rutabagas, turnips, radishes and sauerkraut  Dried beans, peas and lentils  Miscellaneous Salt and spices in moderation  Mustard and vinegar in moderation Fried or highly seasoned foods  Coconut and seeds  Pickles and olives  Chili sauces, ketchup, barbecue sauce, horseradish, black pepper, chili powder and onion and garlic seasonings  Any other strongly  flavored seasoning, condiment, spice or herb not tolerated  Any food not tolerated

## 2022-01-30 NOTE — Progress Notes (Unsigned)
Outpatient Surgical Follow Up  01/30/2022  Andrea [name] is an 56 y.o. female.   Chief Complaint  Patient presents with   New Patient (Initial Visit)    HPI: 56 year old female seen in consultation at the request of Dr.Wohl recalcitrant reflux. Endorses heartburn that is not controlled by trying multtiple PPI.  She also reports chronic cough and hoarseness. There went recent barium swallow that I personally reviewed showing significant reflux stent into the thoracic inlet. Hx cholecystectomy and vaginal hysterectomy Wears 1 lt O2 at night.Active smoker 1ppd. Did have an EGD 2 and half years ago that have personally reviewed showing a small hiatal hernia but no other acute intra-abdominal abnormalities. Some dyspnea on exertion due to COPD.  Does have a history of coronary artery disease  Past Medical History:  Diagnosis Date   Anxiety    panic attacks, chest pain   Arthritis    COPD (chronic obstructive pulmonary disease) (HCC)    GERD (gastroesophageal reflux disease)    Hypertension    not medicated   Non-obstructive CAD in native artery    a. cardiac cath 01/2014: showed minor irregularities, mildly elevated left ventricular end-diastolic pressure and normal ejection fraction; b. 03/2015 MV: low risk.   Palpitations    a. 08/2016 Event Monitor: occas PVC's, two brief runs of SVT (4 and 7 beats).   PONV (postoperative nausea and vomiting)    Symptomatic PVC's (premature ventricular contractions)    Tobacco abuse    Wears dentures    partial upper and lower    Past Surgical History:  Procedure Laterality Date   CARDIAC CATHETERIZATION  01/03/14   CARDIAC CATHETERIZATION     CHOLECYSTECTOMY     COLONOSCOPY WITH PROPOFOL N/A 10/05/2019   Procedure: COLONOSCOPY WITH PROPOFOL;  Surgeon: Lucilla Lame, MD;  Location: [facility];  Service: Endoscopy;  Laterality: N/A;   ESOPHAGOGASTRODUODENOSCOPY (EGD) WITH PROPOFOL N/A 07/11/2016   Procedure: ESOPHAGOGASTRODUODENOSCOPY (EGD)  WITH PROPOFOL;  Surgeon: Lucilla Lame, MD;  Location: [place];  Service: Endoscopy;  Laterality: N/A;   ESOPHAGOGASTRODUODENOSCOPY (EGD) WITH PROPOFOL N/A 10/05/2019   Procedure: ESOPHAGOGASTRODUODENOSCOPY (EGD) WITH PROPOFOL;  Surgeon: Lucilla Lame, MD;  Location: [facility];  Service: Endoscopy;  Laterality: N/A;   EYE SURGERY     IMAGE GUIDED SINUS SURGERY N/A 11/30/2014   Procedure: IMAGE GUIDED SINUS SURGERY;  Surgeon: Carloyn Manner, MD;  Location: [place];  Service: ENT;  Laterality: N/A;  GAVE DISK TO CE CE   MAXILLARY ANTROSTOMY Bilateral 11/30/2014   Procedure: MAXILLARY ANTROSTOMY;  Surgeon: Carloyn Manner, MD;  Location: [place];  Service: ENT;  Laterality: Bilateral;   SEPTOPLASTY N/A 11/30/2014   Procedure: SEPTOPLASTY;  Surgeon: Carloyn Manner, MD;  Location: [place];  Service: ENT;  Laterality: N/A;   SPHENOIDECTOMY Left 11/30/2014   Procedure: Coralee Pesa, ;  Surgeon: Carloyn Manner, MD;  Location: [place];  Service: ENT;  Laterality: Left;   VAGINAL HYSTERECTOMY  1995   vaginal    Family History  Problem Relation Age of Onset   Emphysema Father    Asthma Father    Heart disease Father    Heart attack Father    Arthritis Father        RA   Colon cancer Father    Hypertension Mother    Breast cancer Neg Hx     Social History:  reports that she has been smoking cigarettes. She has a 20.00 pack-year smoking history. She has been exposed to tobacco smoke.  She has never used smokeless tobacco. She reports that she does not drink alcohol and does not use drugs.  Allergies:  Allergies  Allergen Reactions   Alum & Mag Hydroxide-Simeth Diarrhea and Nausea Only   Bactrim [Sulfamethoxazole-Trimethoprim] Itching   Cefdinir Other (See Comments)    tachycardia   Chantix [Varenicline]     Bad dreams   Doxycycline Other (See Comments)    Nausea, migraine   Multaq [Dronedarone] Other (See Comments)    Lip/  mouth numbness/ tongue swelling   Ivp Dye [Iodinated Contrast Media] Palpitations    Medications reviewed.    ROS Full ROS performed and is otherwise negative other than what is stated in HPI   BP 128/83   Pulse 87   Temp 98 F (36.7 C)   Ht '5\' 3"'$  (1.6 m)   Wt 161 lb (73 kg)   SpO2 98%   BMI 28.52 kg/m   Physical Exam Vitals and nursing note reviewed. Exam conducted with a chaperone present.  Constitutional:      General: She is not in acute distress.    Appearance: Normal appearance. She is not ill-appearing.  Eyes:     General: No scleral icterus.       Right eye: No discharge.        Left eye: No discharge.  Cardiovascular:     Rate and Rhythm: Normal rate and regular rhythm.  Pulmonary:     Effort: Pulmonary effort is normal. No respiratory distress.     Breath sounds: Normal breath sounds. No stridor.  Abdominal:     General: Abdomen is flat. There is no distension.     Palpations: Abdomen is soft. There is no mass.     Tenderness: There is no abdominal tenderness. There is no guarding or rebound.     Hernia: No hernia is present.  Musculoskeletal:        General: Normal range of motion.     Cervical back: Normal range of motion and neck supple. No rigidity or tenderness.  Lymphadenopathy:     Cervical: No cervical adenopathy.  Skin:    General: Skin is warm and dry.     Capillary Refill: Capillary refill takes less than 2 seconds.  Neurological:     General: No focal deficit present.     Mental Status: She is alert and oriented to person, place, and time.  Psychiatric:        Mood and Affect: Mood normal.        Behavior: Behavior normal.        Thought Content: Thought content normal.        Judgment: Judgment normal.    Assessment/Plan: 56 year old female with significant medical issues: Coronary artery disease, COPD, anxiety and recalcitrant reflux not responding to PPI.  She is interested in antireflux procedure to control both reflux symptoms and  pulmonary exacerbations. I do think is definitely reasonable to obtain further workup to evaluate intra-abdominal anatomy.  I will go ahead and do a CT scan of the abdomen pelvis and we will ask Dr. Verl Blalock for current EGD given that I will operate on her esophagus and I will make sure there is no intraluminal lesions. Also make sure that we obtain pulmonary and cardiac optimization. Discussed in detail about antireflux surgery.  I do think that she will be a good candidate for robotic approach.  Risk, benefits and possible occasions including but not limited to: Bleeding, infection chronic bloating, abdominal pain, potential recurrences, bowel injuries.  SHe understands and is still very interested Please Note that I have spent 60 minutes's encounter including personally reviewing imaging studies, placing orders, coordinating her care, and providing appropriate documentation   Caroleen Hamman, MD [facility] General Surgeon

## 2022-01-30 NOTE — Addendum Note (Signed)
Addended by: Lesly Rubenstein on: 01/30/2022 03:50 PM   Modules accepted: Orders

## 2022-01-30 NOTE — Addendum Note (Signed)
Addended by: Lesly Rubenstein on: 01/30/2022 03:46 PM   Modules accepted: Orders

## 2022-01-30 NOTE — Progress Notes (Signed)
Request for Pulmonary Clearance has been faxed to Dr Adron Bene office.

## 2022-01-31 ENCOUNTER — Encounter: Payer: Self-pay | Admitting: Surgery

## 2022-01-31 ENCOUNTER — Other Ambulatory Visit: Payer: Self-pay | Admitting: Family Medicine

## 2022-01-31 DIAGNOSIS — H2012 Chronic iridocyclitis, left eye: Secondary | ICD-10-CM

## 2022-02-01 ENCOUNTER — Other Ambulatory Visit: Payer: Self-pay

## 2022-02-01 DIAGNOSIS — K219 Gastro-esophageal reflux disease without esophagitis: Secondary | ICD-10-CM

## 2022-02-03 ENCOUNTER — Telehealth: Payer: 59 | Admitting: Family

## 2022-02-03 DIAGNOSIS — J441 Chronic obstructive pulmonary disease with (acute) exacerbation: Secondary | ICD-10-CM

## 2022-02-03 MED ORDER — AZITHROMYCIN 250 MG PO TABS: ORAL_TABLET | ORAL | 0 refills | Status: DC

## 2022-02-03 MED ORDER — PREDNISONE 10 MG (21) PO TBPK: ORAL_TABLET | ORAL | 0 refills | Status: DC

## 2022-02-03 MED ORDER — BENZONATATE 100 MG PO CAPS: 100.0000 mg | ORAL_CAPSULE | Freq: Three times a day (TID) | ORAL | 0 refills | Status: DC | PRN

## 2022-02-03 NOTE — Progress Notes (Signed)
We are sorry that you are not feeling well.  Here is how we plan to help!  Based on your presentation I believe you most likely have A cough due to a virus.  This is called viral bronchitis and is best treated by rest, plenty of fluids and control of the cough.  You may use Ibuprofen or Tylenol as directed to help your symptoms.     In addition you may use A non-prescription cough medication called Robitussin DAC. Take 2 teaspoons every 8 hours or Delsym: take 2 teaspoons every 12 hours., A non-prescription cough medication called Mucinex DM: take 2 tablets every 12 hours., and A prescription cough medication called Tessalon Perles '100mg'$ . You may take 1-2 capsules every 8 hours as needed for your cough. I have also sent in a Zpak.   Prednisone 10 mg daily for 6 days (see taper instructions below)  Directions for 6 day taper: Day 1: 2 tablets before breakfast, 1 after both lunch & dinner and 2 at bedtime Day 2: 1 tab before breakfast, 1 after both lunch & dinner and 2 at bedtime Day 3: 1 tab at each meal & 1 at bedtime Day 4: 1 tab at breakfast, 1 at lunch, 1 at bedtime Day 5: 1 tab at breakfast & 1 tab at bedtime Day 6: 1 tab at breakfast  From your responses in the eVisit questionnaire you describe inflammation in the upper respiratory tract which is causing a significant cough.  This is commonly called Bronchitis and has four common causes:   Allergies Viral Infections Acid Reflux Bacterial Infection Allergies, viruses and acid reflux are treated by controlling symptoms or eliminating the cause. An example might be a cough caused by taking certain blood pressure medications. You stop the cough by changing the medication. Another example might be a cough caused by acid reflux. Controlling the reflux helps control the cough.  USE OF BRONCHODILATOR ("RESCUE") INHALERS: There is a risk from using your bronchodilator too frequently.  The risk is that over-reliance on a medication which only  relaxes the muscles surrounding the breathing tubes can reduce the effectiveness of medications prescribed to reduce swelling and congestion of the tubes themselves.  Although you feel brief relief from the bronchodilator inhaler, your asthma may actually be worsening with the tubes becoming more swollen and filled with mucus.  This can delay other crucial treatments, such as oral steroid medications. If you need to use a bronchodilator inhaler daily, several times per day, you should discuss this with your provider.  There are probably better treatments that could be used to keep your asthma under control.     HOME CARE Only take medications as instructed by your medical team. Complete the entire course of an antibiotic. Drink plenty of fluids and get plenty of rest. Avoid close contacts especially the very young and the elderly Cover your mouth if you cough or cough into your sleeve. Always remember to wash your hands A steam or ultrasonic humidifier can help congestion.   GET HELP RIGHT AWAY IF: You develop worsening fever. You become short of breath You cough up blood. Your symptoms persist after you have completed your treatment plan MAKE SURE YOU  Understand these instructions. Will watch your condition. Will get help right away if you are not doing well or get worse.    Thank you for choosing an e-visit.  Your e-visit answers were reviewed by a board certified advanced clinical practitioner to complete your personal care plan. Depending upon  the condition, your plan could have included both over the counter or prescription medications.  Please review your pharmacy choice. Make sure the pharmacy is open so you can pick up prescription now. If there is a problem, you may contact your provider through [employer] and have the prescription routed to another pharmacy.  Your safety is important to [place]. If you have drug allergies check your prescription carefully.   For the next 24  hours you can use MyChart to ask questions about today's visit, request a non-urgent call back, or ask for a work or school excuse. You will get an email in the next two days asking about your experience. I hope that your e-visit has been valuable and will speed your recovery.  Approximately 5 minutes was spent documenting and reviewing patient's chart.

## 2022-02-05 ENCOUNTER — Telehealth: Payer: Self-pay | Admitting: Pulmonary Disease

## 2022-02-05 NOTE — Telephone Encounter (Signed)
Received surgical clearance form. Patient is scheduled to see Dr. Patsey Berthold 02/11/2022. Nothing further needed.

## 2022-02-07 ENCOUNTER — Ambulatory Visit: Payer: 59 | Admitting: Pulmonary Disease

## 2022-02-11 ENCOUNTER — Ambulatory Visit (INDEPENDENT_AMBULATORY_CARE_PROVIDER_SITE_OTHER): Payer: 59 | Admitting: Pulmonary Disease

## 2022-02-11 ENCOUNTER — Encounter: Payer: Self-pay | Admitting: Pulmonary Disease

## 2022-02-11 VITALS — BP 124/82 | HR 77 | Temp 97.8°F | Ht 63.0 in | Wt 161.6 lb

## 2022-02-11 DIAGNOSIS — K219 Gastro-esophageal reflux disease without esophagitis: Secondary | ICD-10-CM | POA: Diagnosis not present

## 2022-02-11 DIAGNOSIS — J449 Chronic obstructive pulmonary disease, unspecified: Secondary | ICD-10-CM | POA: Diagnosis not present

## 2022-02-11 DIAGNOSIS — J4489 Other specified chronic obstructive pulmonary disease: Secondary | ICD-10-CM | POA: Diagnosis not present

## 2022-02-11 DIAGNOSIS — F17210 Nicotine dependence, cigarettes, uncomplicated: Secondary | ICD-10-CM

## 2022-02-11 DIAGNOSIS — Z01811 Encounter for preprocedural respiratory examination: Secondary | ICD-10-CM

## 2022-02-11 DIAGNOSIS — G4736 Sleep related hypoventilation in conditions classified elsewhere: Secondary | ICD-10-CM

## 2022-02-11 NOTE — Patient Instructions (Signed)
We will send the clearance for surgery to your surgeon.  Continue Breztri 2 puffs twice a day.  Continue as needed albuterol.  Will see you in follow-up in 4 to 6 months time call sooner should any new problems arise.

## 2022-02-11 NOTE — Progress Notes (Signed)
Subjective:    Patient ID: Andrea [name], female    DOB: 08/29/1965, 56 y.o.   MRN: [id number] Patient Care Team: Birdie Sons, MD as PCP - General (Family Medicine) Tyler Pita, MD as Consulting Physician (Pulmonary Disease)  Chief Complaint  Patient presents with   Follow-up    SOB with when weather gets hot then cold and with GERD. No wheezing. Cough with clear sputum.   HPI Andrea [name] is a 56 year old current smoker (half PPD, 66 PY) with stage II COPD who presents for follow-up on the same.  This is a scheduled visit.  Since her last visit here on 19 Jul 2021 when she was seen by Rexene Edison, NP, she has had issues with worsening gastroesophageal reflux.  Respiratory status is aggravated by her gastroesophageal reflux.  She had a barium swallow on 27 November that showed severe gastroesophageal reflux extending all the way up into her chest and likely the cause of her cough and shortness of breath.  She is to undergo antireflux surgery by Dr. Dahlia Byes.  We are asked to perform preoperative evaluation today as well.  Her last exacerbation of note was on 31 October.  No urgent care or ED visits since then.  She is currently maintained on Breztri 2 puffs twice a day and this controls her symptoms significantly.  Uses levo albuterol as rescue 2-3 times a week.  She has not had any recent fevers, chills or sweats.  No wheezing.  No chest pain, orthopnea or paroxysmal nocturnal dyspnea, no lower extremity edema or calf tenderness.  Endorse any other symptomatology today.  She is on nocturnal oxygen at 1 L/min and is compliant with the therapy notes improvement on her symptoms with the therapy.     DATA 06/18/2016 PFTs: FEV1 1.45 L or 60% predicted VC 2.38 L or 90% predicted FEV1/FVC 61%.  Lung volumes: TLC 102%, RV 140%. DLCO 57%.  No significant bronchodilator response. 09/25/2016 2D echo: LVEF 60 to 65%, normal study. 09/01/2020 overnight oximetry: No significant  desaturations. 01/15/2021 PFTs: FEV1 1.46 L or 57% predicted, FVC 2.31 L or 70% predicted, FEV1/FVC 63%, no bronchodilator response.  TLC 113, RV 200% diffusion capacity 57%, correction by Kco 67%.  Review of Systems A 10 point review of systems was performed and it is as noted above otherwise negative.  Patient Active Problem List   Diagnosis Date Noted   Chronic anterior uveitis of left eye 10/04/2021   Acute hypoxemic respiratory failure ([place]) 06/13/2020   Personal history of colonic polyps    Polyp of transverse colon    Varicose veins of bilateral lower extremities with pain 11/22/2016   Restless leg 11/18/2016   Iron deficiency 11/18/2016   Allergic rhinitis 06/11/2016   Hyperlipidemia 03/19/2015   COPD exacerbation ([place]) 10/04/2014   Depression 09/22/2014   Dyshidrosis 09/22/2014   GERD (gastroesophageal reflux disease) 09/22/2014   Insomnia 09/22/2014   Lung nodule, multiple 09/22/2014   Panic disorder 09/22/2014   Palpitations 01/17/2014   Pulmonary nodule 06/10/2013   Shortness of breath 05/20/2013   COPD, GOLD B 05/20/2013   Tobacco abuse 05/20/2013   Daytime somnolence 05/20/2013   Back pain 08/28/2012   Discoid lupus 05/30/2012   Pain in joint 03/09/2012   Psoriasis 02/21/2012   Fibromyalgia 02/11/2012   Lupus ([place]) 02/11/2012   Panic attacks 02/11/2012   History of adenomatous polyp of colon 09/13/2011   Heartburn 08/06/2011   PVC's (premature ventricular contractions) 01/29/2011   Essential (primary) hypertension 03/04/1998  Social History   Tobacco Use   Smoking status: Every Day    Packs/day: 1.00    Years: 20.00    Total pack years: 20.00    Types: Cigarettes    Passive exposure: Past   Smokeless tobacco: Never   Tobacco comments:    0.5 PPD 02/11/2022  Substance Use Topics   Alcohol use: No    Alcohol/week: 0.0 standard drinks of alcohol   Allergies  Allergen Reactions   Alum & Mag Hydroxide-Simeth Diarrhea and Nausea Only   Bactrim  [Sulfamethoxazole-Trimethoprim] Itching   Cefdinir Other (See Comments)    tachycardia   Chantix [Varenicline]     Bad dreams   Doxycycline Other (See Comments)    Nausea, migraine   Multaq [Dronedarone] Other (See Comments)    Lip/ mouth numbness/ tongue swelling   Ivp Dye [Iodinated Contrast Media] Palpitations   Current Meds  Medication Sig   albuterol (PROVENTIL) (2.5 MG/3ML) 0.083% nebulizer solution USE 1 VIAL IN NEBULIZER EVERY 4 (FOUR) HOURS AS NEEDED FOR WHEEZING OR SHORTNESS OF BREATH.   atorvastatin (LIPITOR) 20 MG tablet Take 1 tablet (20 mg total) by mouth daily.   Budeson-Glycopyrrol-Formoterol (BREZTRI AEROSPHERE) 160-9-4.8 MCG/ACT AERO Inhale 2 puffs into the lungs in the morning and at bedtime.   clonazePAM (KLONOPIN) 1 MG tablet Take 1 tablet (1 mg total) by mouth 2 (two) times daily as needed.   dexlansoprazole (DEXILANT) 60 MG capsule TAKE 1 CAPSULE BY MOUTH EVERY DAY   fluticasone (FLONASE) 50 MCG/ACT nasal spray Place 2 sprays into both nostrils daily.   hydroxychloroquine (PLAQUENIL) 200 MG tablet TAKE 2 TABLETS (400 MG TOTAL) BY MOUTH DAILY FOR DISCOID LUPUS (L93.0)   levalbuterol (XOPENEX HFA) 45 MCG/ACT inhaler Inhale 2 puffs into the lungs every 4 (four) hours as needed for wheezing.   Misc. Devices (PULSE OXIMETER DELUXE) MISC Use daily to monitor oxygen levels   Nebulizers (COMPRESSOR/NEBULIZER) MISC Supplies only   Nebulizers (COMPRESSOR/NEBULIZER) MISC Use as directed   nitroGLYCERIN (NITROSTAT) 0.4 MG SL tablet Place 1 tablet (0.4 mg total) under the tongue every 5 (five) minutes as needed for chest pain.   OXYGEN Inhale 1 L into the lungs as needed.   Respiratory Therapy Supplies (FLUTTER) DEVI 1 Device by Does not apply route daily.   Spacer/Aero-Holding Chambers (AEROCHAMBER MV) inhaler Use as instructed   Spacer/Aero-Holding Chambers (AEROCHAMBER MV) inhaler Use as instructed   Immunization History  Administered Date(s) Administered   Moderna  Sars-Covid-2 Vaccination 11/28/2019, 12/26/2019   Pneumococcal Conjugate-13 11/20/2012   Pneumococcal Polysaccharide-23 08/10/2012      Objective:   Physical Exam BP 124/82 (BP Location: Left Arm, Cuff Size: Normal)   Pulse 77   Temp 97.8 F (36.6 C)   Ht '5\' 3"'$  (1.6 m)   Wt 161 lb 9.6 oz (73.3 kg)   SpO2 98%   BMI 28.63 kg/m  GENERAL: Awake, alert, fully ambulatory.  In no respiratory distress.  No conversational dyspnea.   HEAD: Normocephalic, atraumatic.  EYES: Pupils equal, round, reactive to light.  No scleral icterus.  MOUTH: Dentures upper and lower. NECK: Supple. No thyromegaly. Trachea midline. No JVD.  No adenopathy. PULMONARY: Good air entry bilaterally.  There are coarse breath sounds throughout, no wheezes. CARDIOVASCULAR: S1 and S2. Regular rate and rhythm.  No rubs, murmurs or gallops heard. ABDOMEN: Benign. MUSCULOSKELETAL: No joint deformity, no clubbing, no edema.  NEUROLOGIC: No overt focal deficit.  Speech is fluent.  No gait disturbance. SKIN: Intact,warm,dry. PSYCH: Normal mood and  behavior.    Assessment & Plan:     ICD-10-CM   1. Stage 2 moderate COPD by GOLD classification ([place])  J44.9    Compensated on Breztri and as needed levo albuterol Continue Breztri, continue levo albuterol as needed Follow-up in 4 to 6 months time    2. Nocturnal hypoxemia due to obstructive chronic bronchitis  J44.89    G47.36    She is on oxygen at 1 L/min  She is compliant with the therapy Continue same for now    3. Chronic GERD  K21.9    This issue adds complexity to her management To have antireflux surgery    4. Tobacco dependence due to cigarettes  F17.210    Patient counseled regards discontinuation of smoking    5. Pre-operative respiratory examination  Z01.811    Mild to moderate risk for proposed procedure She is as compensated she is to get     Will see the patient in follow-up in 4 to 6 months time.  She is to contact us prior to that time should any  new difficulties arise.  Renold Don, MD Advanced Bronchoscopy PCCM [facility]    *This note was dictated using voice recognition software/Dragon.  Despite best efforts to proofread, errors can occur which can change the meaning. Any transcriptional errors that result from this process are unintentional and may not be fully corrected at the time of dictation.

## 2022-02-12 NOTE — Progress Notes (Signed)
Pulmonary clearance has been received from Dr Patsey Berthold. The patient is cleared at Medium risk for surgery.

## 2022-02-13 ENCOUNTER — Ambulatory Visit
Admission: RE | Admit: 2022-02-13 | Discharge: 2022-02-13 | Disposition: A | Discharge status: Home / Self Care | Payer: 59 | Source: Ambulatory Visit | Attending: Surgery | Admitting: Surgery

## 2022-02-13 DIAGNOSIS — K449 Diaphragmatic hernia without obstruction or gangrene: Secondary | ICD-10-CM | POA: Insufficient documentation

## 2022-02-13 DIAGNOSIS — K219 Gastro-esophageal reflux disease without esophagitis: Secondary | ICD-10-CM | POA: Diagnosis not present

## 2022-02-18 ENCOUNTER — Ambulatory Visit (INDEPENDENT_AMBULATORY_CARE_PROVIDER_SITE_OTHER): Payer: 59 | Admitting: Physician Assistant

## 2022-02-18 ENCOUNTER — Encounter: Payer: Self-pay | Admitting: Physician Assistant

## 2022-02-18 ENCOUNTER — Telehealth: Payer: Self-pay | Admitting: Pulmonary Disease

## 2022-02-18 VITALS — BP 119/71 | HR 91 | Ht 63.0 in | Wt 164.2 lb

## 2022-02-18 DIAGNOSIS — J441 Chronic obstructive pulmonary disease with (acute) exacerbation: Secondary | ICD-10-CM

## 2022-02-18 MED ORDER — PREDNISONE 20 MG PO TABS: 20.0000 mg | ORAL_TABLET | Freq: Two times a day (BID) | ORAL | 0 refills | Status: DC

## 2022-02-18 NOTE — Progress Notes (Signed)
I,Sha'taria Tyson,acting as a Education administrator for [employer], PA-C.,have documented all relevant documentation on the behalf of Mardene Speak, PA-C,as directed by  [employer], PA-C while in the presence of [employer], PA-C.   Established patient visit   Patient: Andrea [name]   DOB: Apr 09, 1965   56 y.o. Female  MRN: [id number] Visit Date: 02/18/2022  Today's healthcare provider: Mardene Speak, PA-C  CC: COPD exacerbation  Subjective    HPI  -Patient reports she has not smoked in the past 2 days. States she used albuterol nebulizar solution before coming to visit COPD, Follow up  She was last seen for this 13 months ago. (01/12/21) Changes made include given a printed prescription for prednisone to fill only if the albuterol neb does not continue to help or if symptoms worsen before he follow up with Dr. Patsey Berthold.  .   Last was seen by Dr. Patsey Berthold on 02/11/22 for stage 2 moderate COPD, nocturnal hypoxemia, tobacco dependence and pre-operative respiratory examination.   She reports excellent compliance with treatment. She is not having side effects.  she uses rescue inhaler 4 per days. She IS experiencing dyspnea, cough, and wheezing. She is NOT experiencing fatigue, weight loss, chills, or fever. she reports breathing is Worse.  Pulmonary Functions Testing Results:  No results found for: "FEV1", "FVC", "FEV1FVC", "TLC"  -----------------------------------------------------------------------------------------   Medications: Outpatient Medications Prior to Visit  Medication Sig   albuterol (PROVENTIL) (2.5 MG/3ML) 0.083% nebulizer solution USE 1 VIAL IN NEBULIZER EVERY 4 (FOUR) HOURS AS NEEDED FOR WHEEZING OR SHORTNESS OF BREATH.   atorvastatin (LIPITOR) 20 MG tablet Take 1 tablet (20 mg total) by mouth daily.   Budeson-Glycopyrrol-Formoterol (BREZTRI AEROSPHERE) 160-9-4.8 MCG/ACT AERO Inhale 2 puffs into the lungs in the morning and at bedtime.   clonazePAM (KLONOPIN)  1 MG tablet Take 1 tablet (1 mg total) by mouth 2 (two) times daily as needed.   dexlansoprazole (DEXILANT) 60 MG capsule TAKE 1 CAPSULE BY MOUTH EVERY DAY   fluticasone (FLONASE) 50 MCG/ACT nasal spray Place 2 sprays into both nostrils daily.   hydroxychloroquine (PLAQUENIL) 200 MG tablet TAKE 2 TABLETS (400 MG TOTAL) BY MOUTH DAILY FOR DISCOID LUPUS (L93.0)   levalbuterol (XOPENEX HFA) 45 MCG/ACT inhaler Inhale 2 puffs into the lungs every 4 (four) hours as needed for wheezing.   Misc. Devices (PULSE OXIMETER DELUXE) MISC Use daily to monitor oxygen levels   Nebulizers (COMPRESSOR/NEBULIZER) MISC Supplies only   Nebulizers (COMPRESSOR/NEBULIZER) MISC Use as directed   nitroGLYCERIN (NITROSTAT) 0.4 MG SL tablet Place 1 tablet (0.4 mg total) under the tongue every 5 (five) minutes as needed for chest pain.   OXYGEN Inhale 1 L into the lungs as needed.   Respiratory Therapy Supplies (FLUTTER) DEVI 1 Device by Does not apply route daily.   Spacer/Aero-Holding Chambers (AEROCHAMBER MV) inhaler Use as instructed   Spacer/Aero-Holding Chambers (AEROCHAMBER MV) inhaler Use as instructed   No facility-administered medications prior to visit.    Review of Systems  Constitutional:  Positive for activity change. Negative for chills, diaphoresis and fever.  HENT:  Positive for congestion, postnasal drip and rhinorrhea.   Respiratory:  Positive for cough, shortness of breath and wheezing.   Cardiovascular:  Negative for chest pain, palpitations and leg swelling.       Objective    BP 119/71 (BP Location: Right Arm, Patient Position: Sitting, Cuff Size: Normal)   Pulse 91   Ht '5\' 3"'$  (1.6 m)   Wt 164 lb 3.2  oz (74.5 kg)   SpO2 98%   BMI 29.09 kg/m    Physical Exam Vitals reviewed.  Constitutional:      General: She is not in acute distress.    Appearance: Normal appearance. She is well-developed. She is not diaphoretic.  HENT:     Head: Normocephalic and atraumatic.     Right Ear: Ear  canal and external ear normal. There is impacted cerumen (partially).     Left Ear: Ear canal and external ear normal. There is impacted cerumen (partially).     Ears:     Comments: Fluids behind the TMs    Nose: Congestion and rhinorrhea present.  Eyes:     General: No scleral icterus.       Right eye: Discharge (watery) present.        Left eye: Discharge (watery) present.    Extraocular Movements: Extraocular movements intact.     Conjunctiva/sclera: Conjunctivae normal.     Pupils: Pupils are equal, round, and reactive to light.  Neck:     Thyroid: No thyromegaly.  Cardiovascular:     Rate and Rhythm: Normal rate and regular rhythm.     Pulses: Normal pulses.     Heart sounds: Normal heart sounds. No murmur heard. Pulmonary:     Effort: Pulmonary effort is normal. No respiratory distress.     Breath sounds: Wheezing (R lower lung field) present. No rhonchi or rales.  Musculoskeletal:     Cervical back: Normal range of motion and neck supple.     Right lower leg: No edema.     Left lower leg: No edema.  Lymphadenopathy:     Cervical: No cervical adenopathy.  Skin:    General: Skin is warm and dry.     Findings: No rash.  Neurological:     Mental Status: She is alert and oriented to person, place, and time. Mental status is at baseline.  Psychiatric:        Mood and Affect: Mood normal.        Behavior: Behavior normal.      No results found for any visits on 02/18/22.  Assessment & Plan     1. COPD exacerbation (HCC) Recurrent event In the past, has been treated with prednisone and azithromycin for COPD exacerbation Continue  taking Breztri 160-9-4.8 mcg BID and levo albuterol 45 mcg Q 4 hrs for cough, SOB, wheezing. Continue oxygen 1L/min Recommended smoking cessation Before her surgery Start on  - predniSONE (DELTASONE) 20 MG tablet; Take 1 tablet (20 mg total) by mouth 2 (two) times daily with a meal.  Dispense: 10 tablet; Refill: 0  Allergic  rhinitis Continue symptomatic treatment: OTC antihistamines, Flonase 50 mcg  The patient was advised to call back or seek an in-person evaluation if the symptoms worsen or if the condition fails to improve as anticipated.  I discussed the assessment and treatment plan with the patient. The patient was provided an opportunity to ask questions and all were answered. The patient agreed with the plan and demonstrated an understanding of the instructions.  The entirety of the information documented in the History of Present Illness, Review of Systems and Physical Exam were personally obtained by me. Portions of this information were initially documented by the CMA and reviewed by me for thoroughness and accuracy.  Mardene Speak, [facility], [place] [phone] (phone) [phone] (fax)

## 2022-02-18 NOTE — Telephone Encounter (Signed)
Per Dr. Patsey Berthold, she should go to Urgent care so they can test her for Covid, RSV and the flu.  I notified the patient. Nothing further is needed.

## 2022-02-18 NOTE — Telephone Encounter (Signed)
I spoke with the patient. She said she had a constant cough yesterday. All day and all night. No fevers, chills or sweats.  Using her nebulizer.

## 2022-02-20 ENCOUNTER — Encounter: Payer: Self-pay | Admitting: Gastroenterology

## 2022-02-21 ENCOUNTER — Encounter: Payer: Self-pay | Admitting: Gastroenterology

## 2022-02-21 ENCOUNTER — Ambulatory Visit: Payer: Self-pay | Admitting: *Deleted

## 2022-02-21 ENCOUNTER — Ambulatory Visit: Payer: 59 | Admitting: Certified Registered"

## 2022-02-21 ENCOUNTER — Ambulatory Visit
Admission: RE | Admit: 2022-02-21 | Discharge: 2022-02-21 | Disposition: A | Discharge status: Home / Self Care | Payer: 59 | Attending: Gastroenterology | Admitting: Gastroenterology

## 2022-02-21 ENCOUNTER — Encounter
Admission: RE | Disposition: A | Discharge status: Home / Self Care | Payer: Self-pay | Source: Home / Self Care | Attending: Gastroenterology

## 2022-02-21 ENCOUNTER — Other Ambulatory Visit: Payer: Self-pay | Admitting: Physician Assistant

## 2022-02-21 DIAGNOSIS — Z9049 Acquired absence of other specified parts of digestive tract: Secondary | ICD-10-CM | POA: Diagnosis not present

## 2022-02-21 DIAGNOSIS — I10 Essential (primary) hypertension: Secondary | ICD-10-CM | POA: Diagnosis not present

## 2022-02-21 DIAGNOSIS — J449 Chronic obstructive pulmonary disease, unspecified: Secondary | ICD-10-CM | POA: Diagnosis not present

## 2022-02-21 DIAGNOSIS — I2510 Atherosclerotic heart disease of native coronary artery without angina pectoris: Secondary | ICD-10-CM | POA: Insufficient documentation

## 2022-02-21 DIAGNOSIS — F410 Panic disorder [episodic paroxysmal anxiety] without agoraphobia: Secondary | ICD-10-CM | POA: Diagnosis not present

## 2022-02-21 DIAGNOSIS — F17210 Nicotine dependence, cigarettes, uncomplicated: Secondary | ICD-10-CM | POA: Insufficient documentation

## 2022-02-21 DIAGNOSIS — K2960 Other gastritis without bleeding: Secondary | ICD-10-CM | POA: Diagnosis not present

## 2022-02-21 DIAGNOSIS — J441 Chronic obstructive pulmonary disease with (acute) exacerbation: Secondary | ICD-10-CM

## 2022-02-21 DIAGNOSIS — Z79899 Other long term (current) drug therapy: Secondary | ICD-10-CM | POA: Insufficient documentation

## 2022-02-21 DIAGNOSIS — F32A Depression, unspecified: Secondary | ICD-10-CM | POA: Diagnosis not present

## 2022-02-21 DIAGNOSIS — K2900 Acute gastritis without bleeding: Secondary | ICD-10-CM

## 2022-02-21 DIAGNOSIS — K219 Gastro-esophageal reflux disease without esophagitis: Secondary | ICD-10-CM | POA: Insufficient documentation

## 2022-02-21 HISTORY — PX: ESOPHAGOGASTRODUODENOSCOPY (EGD) WITH PROPOFOL: SHX5813

## 2022-02-21 SURGERY — ESOPHAGOGASTRODUODENOSCOPY (EGD) WITH PROPOFOL
Anesthesia: General

## 2022-02-21 MED ORDER — DEXMEDETOMIDINE HCL IN NACL 80 MCG/20ML IV SOLN
INTRAVENOUS | Status: DC | PRN
  Administered 2022-02-21: 12 ug via BUCCAL

## 2022-02-21 MED ORDER — LIDOCAINE HCL (CARDIAC) PF 100 MG/5ML IV SOSY
PREFILLED_SYRINGE | INTRAVENOUS | Status: DC | PRN
  Administered 2022-02-21: 100 mg via INTRAVENOUS

## 2022-02-21 MED ORDER — PREDNISONE 20 MG PO TABS: 20.0000 mg | ORAL_TABLET | Freq: Every day | ORAL | 0 refills | Status: DC

## 2022-02-21 MED ORDER — PROPOFOL 10 MG/ML IV BOLUS
INTRAVENOUS | Status: DC | PRN
  Administered 2022-02-21: 10 mg via INTRAVENOUS
  Administered 2022-02-21: 80 mg via INTRAVENOUS
  Administered 2022-02-21: 10 mg via INTRAVENOUS

## 2022-02-21 MED ORDER — AZITHROMYCIN 250 MG PO TABS: ORAL_TABLET | ORAL | 0 refills | Status: AC

## 2022-02-21 MED ORDER — PROPOFOL 1000 MG/100ML IV EMUL
INTRAVENOUS | Status: AC
  Filled 2022-02-21: qty 100

## 2022-02-21 MED ORDER — SODIUM CHLORIDE 0.9 % IV SOLN: INTRAVENOUS | Status: DC

## 2022-02-21 NOTE — Op Note (Signed)
[facility] Gastroenterology Patient Name: Andrea [name] Procedure Date: 02/21/2022 7:28 AM MRN: [id number] Account #: [id number] Date of Birth: 10/17/1965 Admit Type: Outpatient Age: 56 Room: [facility] ENDO ROOM 4 Gender: Female Note Status: Finalized Instrument Name: Altamese Cabal Endoscope [id number] Procedure:             Upper GI endoscopy Indications:           Heartburn Providers:             Lucilla Lame MD, MD Medicines:             Propofol per Anesthesia Complications:         No immediate complications. Procedure:             Pre-Anesthesia Assessment:                        - Prior to the procedure, a History and Physical was                         performed, and patient medications and allergies were                         reviewed. The patient's tolerance of previous                         anesthesia was also reviewed. The risks and benefits                         of the procedure and the sedation options and risks                         were discussed with the patient. All questions were                         answered, and informed consent was obtained. Prior                         Anticoagulants: The patient has taken no anticoagulant                         or antiplatelet agents. ASA Grade Assessment: II - A                         patient with mild systemic disease. After reviewing                         the risks and benefits, the patient was deemed in                         satisfactory condition to undergo the procedure.                        After obtaining informed consent, the endoscope was                         passed under direct vision. Throughout the procedure,                         the patient's blood pressure,  pulse, and oxygen                         saturations were monitored continuously. The Endoscope                         was introduced through the mouth, and advanced to the                         second part of duodenum. The  upper GI endoscopy was                         accomplished without difficulty. The patient tolerated                         the procedure well. Findings:      The examined esophagus was normal.      Localized mild inflammation characterized by erythema was found in the       gastric antrum. Biopsies were taken with a cold forceps for histology.      The examined duodenum was normal. Impression:            - Normal esophagus.                        - Gastritis. Biopsied.                        - Normal examined duodenum. Recommendation:        - Discharge patient to home.                        - Resume previous diet.                        - Continue present medications.                        - Await pathology results. Procedure Code(s):     --- Professional ---                        [phone], Esophagogastroduodenoscopy, flexible,                         transoral; with biopsy, single or multiple Diagnosis Code(s):     --- Professional ---                        R12, Heartburn                        K29.70, Gastritis, unspecified, without bleeding CPT copyright 2022 American Medical Association. All rights reserved. The codes documented in this report are preliminary and upon coder review may  be revised to meet current compliance requirements. Lucilla Lame MD, MD 02/21/2022 8:06:33 AM This report has been signed electronically. Number of Addenda: 0 Note Initiated On: 02/21/2022 7:28 AM Estimated Blood Loss:  Estimated blood loss: none.      [facility]

## 2022-02-21 NOTE — Transfer of Care (Signed)
Immediate Anesthesia Transfer of Care Note  Patient: Andrea [name]  Procedure(s) Performed: ESOPHAGOGASTRODUODENOSCOPY (EGD) WITH PROPOFOL  Patient Location: PACU and Endoscopy Unit  Anesthesia Type:General  Level of Consciousness: awake  Airway & Oxygen Therapy: Patient Spontanous Breathing  Post-op Assessment: Report given to RN and Post -op Vital signs reviewed and stable  Post vital signs: Reviewed and stable  Last Vitals:  Vitals Value Taken Time  BP 119/102 02/21/22 0807  Temp 36.1 C 02/21/22 0807  Pulse 82 02/21/22 0807  Resp 27 02/21/22 0807  SpO2 100 % 02/21/22 0807    Last Pain:  Vitals:   02/21/22 0807  TempSrc: Temporal  PainSc: 0-No pain         Complications: No notable events documented.

## 2022-02-21 NOTE — Telephone Encounter (Signed)
Message from Erick Blinks sent at 02/21/2022 10:08 AM EST  Summary: Symptoms worsening since appt on 02/18/2022, seeking abx   Pt called to report that her symptoms are getting worse, she says she was just seen by Mardene Speak who told her to call today if her symptoms did not improve. Pt believes they discussed a possible abx if she arrived at this point. Please advise          Call History   Type Contact Phone/Fax User  02/21/2022 10:06 AM EST Phone (Incoming) Andrea, [name] (Self) [phone] Jerilynn Mages) Marlan Palau

## 2022-02-21 NOTE — H&P (Signed)
Andrea Lame, MD [address] [place], [place] [address] Phone:[phone] Fax : [phone]  Primary Care Physician:  Birdie Sons, MD Primary Gastroenterologist:  Dr. Allen Norris  Pre-Procedure History & Physical: HPI:  Andrea [name] is a 56 y.o. female is here for an endoscopy.   Past Medical History:  Diagnosis Date   Anxiety    panic attacks, chest pain   Arthritis    COPD (chronic obstructive pulmonary disease) (HCC)    GERD (gastroesophageal reflux disease)    Hypertension    not medicated   Non-obstructive CAD in native artery    a. cardiac cath 01/2014: showed minor irregularities, mildly elevated left ventricular end-diastolic pressure and normal ejection fraction; b. 03/2015 MV: low risk.   Palpitations    a. 08/2016 Event Monitor: occas PVC's, two brief runs of SVT (4 and 7 beats).   PONV (postoperative nausea and vomiting)    Symptomatic PVC's (premature ventricular contractions)    Tobacco abuse    Wears dentures    partial upper and lower    Past Surgical History:  Procedure Laterality Date   CARDIAC CATHETERIZATION  01/03/14   CARDIAC CATHETERIZATION     CHOLECYSTECTOMY     COLONOSCOPY WITH PROPOFOL N/A 10/05/2019   Procedure: COLONOSCOPY WITH PROPOFOL;  Surgeon: Andrea Lame, MD;  Location: [facility] ENDOSCOPY;  Service: Endoscopy;  Laterality: N/A;   ESOPHAGOGASTRODUODENOSCOPY (EGD) WITH PROPOFOL N/A 07/11/2016   Procedure: ESOPHAGOGASTRODUODENOSCOPY (EGD) WITH PROPOFOL;  Surgeon: Andrea Lame, MD;  Location: [place];  Service: Endoscopy;  Laterality: N/A;   ESOPHAGOGASTRODUODENOSCOPY (EGD) WITH PROPOFOL N/A 10/05/2019   Procedure: ESOPHAGOGASTRODUODENOSCOPY (EGD) WITH PROPOFOL;  Surgeon: Andrea Lame, MD;  Location: [facility] ENDOSCOPY;  Service: Endoscopy;  Laterality: N/A;   EYE SURGERY     IMAGE GUIDED SINUS SURGERY N/A 11/30/2014   Procedure: IMAGE GUIDED SINUS SURGERY;  Surgeon: Carloyn Manner, MD;  Location: [place];   Service: ENT;  Laterality: N/A;  GAVE DISK TO CE CE   MAXILLARY ANTROSTOMY Bilateral 11/30/2014   Procedure: MAXILLARY ANTROSTOMY;  Surgeon: Carloyn Manner, MD;  Location: [place];  Service: ENT;  Laterality: Bilateral;   SEPTOPLASTY N/A 11/30/2014   Procedure: SEPTOPLASTY;  Surgeon: Carloyn Manner, MD;  Location: [place];  Service: ENT;  Laterality: N/A;   SPHENOIDECTOMY Left 11/30/2014   Procedure: Coralee Pesa, ;  Surgeon: Carloyn Manner, MD;  Location: [place];  Service: ENT;  Laterality: Left;   [place]   vaginal    Prior to Admission medications   Medication Sig Start Date End Date Taking? Authorizing Provider  atorvastatin (LIPITOR) 20 MG tablet Take 1 tablet (20 mg total) by mouth daily. 01/14/22  Yes Birdie Sons, MD  Budeson-Glycopyrrol-Formoterol (BREZTRI AEROSPHERE) 160-9-4.8 MCG/ACT AERO Inhale 2 puffs into the lungs in the morning and at bedtime. 10/26/21  Yes Tyler Pita, MD  clonazePAM (KLONOPIN) 1 MG tablet Take 1 tablet (1 mg total) by mouth 2 (two) times daily as needed. 01/02/22  Yes Birdie Sons, MD  dexlansoprazole (DEXILANT) 60 MG capsule TAKE 1 CAPSULE BY MOUTH EVERY DAY 12/25/21  Yes Andrea Lame, MD  hydroxychloroquine (PLAQUENIL) 200 MG tablet TAKE 2 TABLETS (400 MG TOTAL) BY MOUTH DAILY FOR DISCOID LUPUS (L93.0) 12/11/21  Yes Birdie Sons, MD  levalbuterol [facility] HFA) 45 MCG/ACT inhaler Inhale 2 puffs into the lungs every 4 (four) hours as needed for wheezing. 05/28/21  Yes Tyler Pita, MD  Misc. Devices (PULSE OXIMETER  DELUXE) MISC Use daily to monitor oxygen levels 01/07/20  Yes Mar Daring, PA-C  OXYGEN Inhale 1 L into the lungs as needed.   Yes [provider]  Respiratory Therapy Supplies (FLUTTER) DEVI 1 Device by Does not apply route daily. 03/19/15  Yes Theodoro Grist, MD  Spacer/Aero-Holding Chambers (AEROCHAMBER MV) inhaler Use as instructed 01/13/20  Yes  Tyler Pita, MD  Spacer/Aero-Holding Chambers (AEROCHAMBER MV) inhaler Use as instructed 07/19/21  Yes Parrett, Annelie S, NP  albuterol (PROVENTIL) (2.5 MG/3ML) 0.083% nebulizer solution USE 1 VIAL IN NEBULIZER EVERY 4 (FOUR) HOURS AS NEEDED FOR WHEEZING OR SHORTNESS OF BREATH. 11/28/20   Flora Lipps, MD  fluticasone (FLONASE) 50 MCG/ACT nasal spray Place 2 sprays into both nostrils daily. Patient taking differently: Place into both nostrils daily. 01/09/22   Birdie Sons, MD  Nebulizers (COMPRESSOR/NEBULIZER) MISC Supplies only 11/28/20   Flora Lipps, MD  Nebulizers (COMPRESSOR/NEBULIZER) MISC Use as directed 12/01/20   Flora Lipps, MD  nitroGLYCERIN (NITROSTAT) 0.4 MG SL tablet Place 1 tablet (0.4 mg total) under the tongue every 5 (five) minutes as needed for chest pain. 12/08/14   Wellington Hampshire, MD  predniSONE (DELTASONE) 20 MG tablet Take 1 tablet (20 mg total) by mouth 2 (two) times daily with a meal. Patient not taking: Reported on 02/21/2022 02/18/22   Mardene Speak, PA-C    Allergies as of 02/01/2022 - Review Complete 01/30/2022  Allergen Reaction Noted   Alum & mag hydroxide-simeth Diarrhea and Nausea Only 11/02/2014   Bactrim [sulfamethoxazole-trimethoprim] Itching 01/17/2020   Cefdinir Other (See Comments) 07/06/2013   Chantix [varenicline]  06/11/2016   Doxycycline Other (See Comments) 11/18/2013   Multaq [dronedarone] Other (See Comments) 07/22/2017   Ivp dye [iodinated contrast media] Palpitations 05/20/2013    Family History  Problem Relation Age of Onset   Emphysema Father    Asthma Father    Heart disease Father    Heart attack Father    Arthritis Father        RA   Colon cancer Father    Hypertension Mother    Breast cancer Neg Hx     Social History   Socioeconomic History   Marital status: Married    Spouse name: Not on file   Number of children: 3   Years of education: Not on file   Highest education level: GED or equivalent  Occupational  History   Occupation: Disabled    Comment: Social Security as of 05/21/2011  Tobacco Use   Smoking status: Every Day    Packs/day: 1.00    Years: 20.00    Total pack years: 20.00    Types: Cigarettes    Passive exposure: Past   Smokeless tobacco: Never   Tobacco comments:    0.5 PPD 02/11/2022  Vaping Use   Vaping Use: Never used  Substance and Sexual Activity   Alcohol use: No    Alcohol/week: 0.0 standard drinks of alcohol   Drug use: No   Sexual activity: Not Currently  Other Topics Concern   Not on file  Social History Narrative   Lives at home with husband. Independent at baseline.   Social Determinants of Health   Financial Resource Strain: Medium Risk (04/12/2021)   Overall Financial Resource Strain (CARDIA)    Difficulty of Paying Living Expenses: Somewhat hard  Food Insecurity: No Food Insecurity (04/12/2021)   Hunger Vital Sign    Worried About Running Out of Food in the Last Year: Never true  Ran Out of Food in the Last Year: Never true  Transportation Needs: No Transportation Needs (04/12/2021)   PRAPARE - Hydrologist (Medical): No    Lack of Transportation (Non-Medical): No  Physical Activity: Insufficiently Active (04/12/2021)   Exercise Vital Sign    Days of Exercise per Week: 2 days    Minutes of Exercise per Session: 20 min  Stress: Stress Concern Present (04/12/2021)   [place]    Feeling of Stress : To some extent  Social Connections: Moderately Isolated (04/12/2021)   Social Connection and Isolation Panel [NHANES]    Frequency of Communication with Friends and Family: More than three times a week    Frequency of Social Gatherings with Friends and Family: Once a week    Attends Religious Services: Never    Marine scientist or Organizations: No    Attends Archivist Meetings: Never    Marital Status: Married  Human resources officer Violence: Not At  Risk (04/12/2021)   Humiliation, Afraid, Rape, and Kick questionnaire    Fear of Current or Ex-Partner: No    Emotionally Abused: No    Physically Abused: No    Sexually Abused: No    Review of Systems: See HPI, otherwise negative ROS  Physical Exam: Pulse 88   Temp (!) 96.4 F (35.8 C) (Temporal)   Resp 16   Ht '5\' 3"'$  (1.6 m)   Wt 73.6 kg   BMI 28.75 kg/m  General:   Alert,  pleasant and cooperative in NAD Head:  Normocephalic and atraumatic. Neck:  Supple; no masses or thyromegaly. Lungs:  Clear throughout to auscultation.    Heart:  Regular rate and rhythm. Abdomen:  Soft, nontender and nondistended. Normal bowel sounds, without guarding, and without rebound.   Neurologic:  Alert and  oriented x4;  grossly normal neurologically.  Impression/Plan: Andrea [name] is here for an endoscopy to be performed for GERD  Risks, benefits, limitations, and alternatives regarding  endoscopy have been reviewed with the patient.  Questions have been answered.  All parties agreeable.   Andrea Lame, MD  02/21/2022, 7:52 AM

## 2022-02-21 NOTE — Anesthesia Preprocedure Evaluation (Signed)
Anesthesia Evaluation  Patient identified by MRN, date of birth, ID band Patient awake    Reviewed: Allergy & Precautions, NPO status , Patient's Chart, lab work & pertinent test results  Airway Mallampati: II  TM Distance: >3 FB Neck ROM: Full    Dental  (+) Partial Upper, Partial Lower, Dental Advisory Given   Pulmonary neg pulmonary ROS, shortness of breath, COPD, Current Smoker   Pulmonary exam normal  + decreased breath sounds      Cardiovascular Exercise Tolerance: Good hypertension, Pt. on medications + CAD  negative cardio ROS Normal cardiovascular exam Rhythm:Regular Rate:Normal     Neuro/Psych   Anxiety Depression    negative neurological ROS  negative psych ROS   GI/Hepatic negative GI ROS, Neg liver ROS,GERD  Medicated,,  Endo/Other  negative endocrine ROS    Renal/GU negative Renal ROS  negative genitourinary   Musculoskeletal  (+)  Fibromyalgia -  Abdominal Normal abdominal exam  (+)   Peds negative pediatric ROS (+)  Hematology negative hematology ROS (+)   Anesthesia Other Findings Past Medical History: No date: Anxiety     Comment:  panic attacks, chest pain No date: Arthritis No date: COPD (chronic obstructive pulmonary disease) (HCC) No date: GERD (gastroesophageal reflux disease) No date: Hypertension     Comment:  not medicated No date: Non-obstructive CAD in native artery     Comment:  a. cardiac cath 01/2014: showed minor irregularities,               mildly elevated left ventricular end-diastolic pressure               and normal ejection fraction; b. 03/2015 MV: low risk. No date: Palpitations     Comment:  a. 08/2016 Event Monitor: occas PVC's, two brief runs of               SVT (4 and 7 beats). No date: PONV (postoperative nausea and vomiting) No date: Symptomatic PVC's (premature ventricular contractions) No date: Tobacco abuse No date: Wears dentures     Comment:  partial  upper and lower  Past Surgical History: 01/03/14: CARDIAC CATHETERIZATION No date: CARDIAC CATHETERIZATION No date: CHOLECYSTECTOMY 10/05/2019: COLONOSCOPY WITH PROPOFOL; N/A     Comment:  Procedure: COLONOSCOPY WITH PROPOFOL;  Surgeon: Lucilla Lame, MD;  Location: [facility];  Service:               Endoscopy;  Laterality: N/A; 07/11/2016: ESOPHAGOGASTRODUODENOSCOPY (EGD) WITH PROPOFOL; N/A     Comment:  Procedure: ESOPHAGOGASTRODUODENOSCOPY (EGD) WITH               PROPOFOL;  Surgeon: Lucilla Lame, MD;  Location: [place];  Service: Endoscopy;  Laterality: N/A; 10/05/2019: ESOPHAGOGASTRODUODENOSCOPY (EGD) WITH PROPOFOL; N/A     Comment:  Procedure: ESOPHAGOGASTRODUODENOSCOPY (EGD) WITH               PROPOFOL;  Surgeon: Lucilla Lame, MD;  Location: [facility]               ENDOSCOPY;  Service: Endoscopy;  Laterality: N/A; No date: EYE SURGERY 11/30/2014: IMAGE GUIDED SINUS SURGERY; N/A     Comment:  Procedure: IMAGE GUIDED SINUS SURGERY;  Surgeon:               Carloyn Manner, MD;  Location: [place];  Service: ENT;  Laterality: N/A;  GAVE DISK TO CE CE 11/30/2014: MAXILLARY ANTROSTOMY; Bilateral     Comment:  Procedure: MAXILLARY ANTROSTOMY;  Surgeon: Carloyn Manner, MD;  Location: [place];  Service:               ENT;  Laterality: Bilateral; 11/30/2014: SEPTOPLASTY; N/A     Comment:  Procedure: SEPTOPLASTY;  Surgeon: Carloyn Manner, MD;               Location: [place];  Service: ENT;                Laterality: N/A; 11/30/2014: SPHENOIDECTOMY; Left     Comment:  Procedure: SPHENOIDECTOMY, ;  Surgeon: Carloyn Manner,              MD;  Location: [place];  Service: ENT;                Laterality: Left; 1995: VAGINAL HYSTERECTOMY     Comment:  vaginal  BMI    Body Mass Index: 28.75 kg/m      Reproductive/Obstetrics negative OB ROS                              Anesthesia Physical Anesthesia Plan  ASA: 3  Anesthesia Plan: General   Post-op Pain Management:    Induction: Intravenous  PONV Risk Score and Plan: Propofol infusion and TIVA  Airway Management Planned: Natural Airway  Additional Equipment:   Intra-op Plan:   Post-operative Plan:   Informed Consent: I have reviewed the patients History and Physical, chart, labs and discussed the procedure including the risks, benefits and alternatives for the proposed anesthesia with the patient or authorized representative who has indicated his/her understanding and acceptance.     Dental Advisory Given  Plan Discussed with: CRNA and Surgeon  Anesthesia Plan Comments:        Anesthesia Quick Evaluation

## 2022-02-21 NOTE — Anesthesia Procedure Notes (Signed)
Procedure Name: MAC Date/Time: 02/21/2022 8:01 AM  Performed by: Biagio Borg, CRNAPre-anesthesia Checklist: Patient identified, Emergency Drugs available, Suction available, Patient being monitored and Timeout performed Patient Re-evaluated:Patient Re-evaluated prior to induction Oxygen Delivery Method: Simple face mask Induction Type: IV induction Placement Confirmation: positive ETCO2 and CO2 detector

## 2022-02-21 NOTE — Telephone Encounter (Signed)
Attempted to return her call.   Left a voicemail to call back. 

## 2022-02-21 NOTE — Anesthesia Postprocedure Evaluation (Signed)
Anesthesia Post Note  Patient: Andrea [name]  Procedure(s) Performed: ESOPHAGOGASTRODUODENOSCOPY (EGD) WITH PROPOFOL  Patient location during evaluation: PACU Anesthesia Type: General Level of consciousness: awake Pain management: pain level controlled Vital Signs Assessment: post-procedure vital signs reviewed and stable Respiratory status: spontaneous breathing and nonlabored ventilation Cardiovascular status: stable Anesthetic complications: no  No notable events documented.   Last Vitals:  Vitals:   02/21/22 0817 02/21/22 0827  BP: 108/71 112/76  Pulse: 78 77  Resp: (!) 26 (!) 23  Temp:    SpO2: 96% 99%    Last Pain:  Vitals:   02/21/22 0827  TempSrc:   PainSc: 0-No pain                 VAN STAVEREN,Daven Pinckney

## 2022-02-21 NOTE — Progress Notes (Signed)
Patient was advised.  

## 2022-02-21 NOTE — Progress Notes (Signed)
Refill for prednisone, '20mg'$  daily for the next 4 days and abx was sent to your pharmacy

## 2022-02-21 NOTE — Telephone Encounter (Signed)
  Chief Complaint: medication request- COPD Symptoms: cough, nasal discharge Frequency: 1 week Pertinent Negatives: Patient denies fever Disposition: '[]'$ ED /'[]'$ Urgent Care (no appt availability in office) / '[]'$ Appointment(In office/virtual)/ '[]'$  [facility]/ '[]'$ Home Care/ '[]'$ Refused Recommended Disposition /'[]'$ [facility]/ '[x]'$  Follow-up with PCP Additional Notes: Patient was seen in office and treated for COPD flare with prednisone- patient reports although she is improving- her symptoms are not completely clear and she feels she needs prolonged prednisone and antibiotic. Patient advised I would send request since she was in office 02/18/22  Reason for Disposition  [1] Known COPD or other severe lung disease (i.e., bronchiectasis, cystic fibrosis, lung surgery) AND [2] worsening symptoms (i.e., increased sputum purulence or amount, increased breathing difficulty    Symptoms not worse- but not completely gone with current treatment  Answer Assessment - Initial Assessment Questions 1. ONSET: "When did the cough begin?"      1 week 2. SEVERITY: "How bad is the cough today?"      Cough still present- still taking prednisone- helping but not completely better- patient is afraid she will run out of prednisone before she gets completely better 3. SPUTUM: "Describe the color of your sputum" (none, dry cough; clear, white, yellow, green)     Blood in mucus from nose, coughing- clear to yellow 4. HEMOPTYSIS: "Are you coughing up any blood?" If so ask: "How much?" (flecks, streaks, tablespoons, etc.)     no 5. DIFFICULTY BREATHING: "Are you having difficulty breathing?" If Yes, ask: "How bad is it?" (e.g., mild, moderate, severe)    - MILD: No SOB at rest, mild SOB with walking, speaks normally in sentences, can lie down, no retractions, pulse < 100.    - MODERATE: SOB at rest, SOB with minimal exertion and prefers to sit, cannot lie down flat, speaks in phrases, mild retractions, audible  wheezing, pulse 100-120.    - SEVERE: Very SOB at rest, speaks in single words, struggling to breathe, sitting hunched forward, retractions, pulse > 120      Normal under treatment 6. FEVER: "Do you have a fever?" If Yes, ask: "What is your temperature, how was it measured, and when did it start?"     no 7. CARDIAC HISTORY: "Do you have any history of heart disease?" (e.g., heart attack, congestive heart failure)      no 8. LUNG HISTORY: "Do you have any history of lung disease?"  (e.g., pulmonary embolus, asthma, emphysema)     COPD 9. PE RISK FACTORS: "Do you have a history of blood clots?" (or: recent major surgery, recent prolonged travel, bedridden)     no 10. OTHER SYMPTOMS: "Do you have any other symptoms?" (e.g., runny nose, wheezing, chest pain)       Runny nose  Protocols used: Cough - Acute Productive-A-AH

## 2022-02-22 LAB — SURGICAL PATHOLOGY

## 2022-02-23 ENCOUNTER — Ambulatory Visit: Admit: 2022-02-23 | Discharge status: Against Medical Advice | Payer: 59

## 2022-02-23 ENCOUNTER — Encounter: Payer: Self-pay | Admitting: Gastroenterology

## 2022-02-28 ENCOUNTER — Encounter: Payer: Self-pay | Admitting: Pulmonary Disease

## 2022-02-28 ENCOUNTER — Encounter: Payer: Self-pay | Admitting: Gastroenterology

## 2022-02-28 ENCOUNTER — Other Ambulatory Visit: Payer: Self-pay | Admitting: Family Medicine

## 2022-02-28 ENCOUNTER — Other Ambulatory Visit: Payer: Self-pay | Admitting: Gastroenterology

## 2022-03-05 DIAGNOSIS — J449 Chronic obstructive pulmonary disease, unspecified: Secondary | ICD-10-CM | POA: Diagnosis not present

## 2022-03-11 ENCOUNTER — Ambulatory Visit (INDEPENDENT_AMBULATORY_CARE_PROVIDER_SITE_OTHER): Payer: Medicare Other | Admitting: Surgery

## 2022-03-11 ENCOUNTER — Encounter: Payer: Self-pay | Admitting: Surgery

## 2022-03-11 VITALS — BP 144/80 | HR 90 | Temp 97.6°F | Ht 63.0 in | Wt 164.2 lb

## 2022-03-11 DIAGNOSIS — K219 Gastro-esophageal reflux disease without esophagitis: Secondary | ICD-10-CM | POA: Diagnosis not present

## 2022-03-11 DIAGNOSIS — K449 Diaphragmatic hernia without obstruction or gangrene: Secondary | ICD-10-CM | POA: Diagnosis not present

## 2022-03-11 NOTE — Patient Instructions (Signed)
We have spoken today about repairing your Hiatal Hernia. Your surgery will be scheduled at [facility] with Dr. Dahlia Byes.  Plan to be in the hospital for 1-2 days if the minimally invasive surgery is completed without having to make a bigger incision. If the bigger incision is made, you will most likely need to be in the hospital 4-6 days. You will be on a soft diet and need to recover for 2 weeks following your surgery prior to doing any of your normal activities. At the 2 week mark, we will see you in the office and if you are doing ok we will advance your diet and activity level as you tolerate.  Please see your Blue (Pre-care) Sheet for more information regarding your surgery. Our surgery scheduler will call you to verify surgery date and to go over information  You will need to arrange to be out of work for approximately 1-2 weeks and then you may return with a lifting restriction for 4 more weeks. If you have FMLA or Disability paperwork that needs to be filled out, please have your company fax your paperwork to [phone] or you may drop this by either office. This paperwork will be filled out within 3 days after your surgery has been completed.  Please call our office with any questions or concerns that you have regarding your surgery and recovery.    Laparoscopic Nissen Fundoplication Laparoscopic Nissen fundoplication is surgery to relieve heartburn and other problems caused by gastric fluids flowing up into your esophagus. The esophagus is the tube that carries food and liquid from your throat to your stomach. Normally, the muscle that sits between your stomach and your esophagus (lower esophageal sphincter or LES) keeps stomach fluids in your stomach. In some people, the LES does not work properly, and stomach fluids flow up into the esophagus. This can happen when part of the stomach bulges through the LES (hiatal hernia). The backward flow of stomach fluids can cause a type of severe and  long-standing heartburn that is called gastroesophageal reflux disease (GERD). You may need this surgery if other treatments for GERD have not helped. In a laparoscopic Nissen fundoplication, the upper part of your stomach is wrapped around the lower part of your esophagus to strengthen the LES and prevent reflux. If you have a hiatal hernia, it will also be repaired with this surgery. The procedure is done through several small incisions in your abdomen. It is performed using a thin, telescopic instrument (laparoscope) and other instruments that can pass through the scope or through other small incisions. Tell a health care provider about: Any allergies you have. All medicines you are taking, including vitamins, herbs, eye drops, creams, and over-the-counter medicines. Any problems you or family members have had with anesthetic medicines. Any blood disorders you have. Any surgeries you have had. Any medical conditions you have. What are the risks? Generally, this is a safe procedure. However, problems may occur, including: Difficulty swallowing (dysphagia). Bloating. Nausea or vomiting. Damage to the lung, causing a collapsed lung. Infection or bleeding. What happens before the procedure? Ask your health care provider about: Changing or stopping your regular medicines. This is especially important if you are taking diabetes medicines or blood thinners. Taking medicines such as aspirin and ibuprofen. These medicines can thin your blood. Do not take these medicines before your procedure if your health care provider asks you not to. Follow your health care provider's instructions about eating or drinking restrictions. Plan to have someone take you  home after the procedure. What happens during the procedure? An IV tube will be inserted into one of your veins. It will be used to give you fluids and medicines during the procedure. You will be given a medicine that makes you fall asleep (general  anesthetic). Your abdomen will be cleaned with a germ-killing solution (antiseptic). The surgeon will make a small incision in your abdomen and insert a tube through the incision. Your abdomen will be filled with a gas. This helps the surgeon to see your organs more easily and it makes more space to work. The surgeon will insert the laparoscope through the incision. The scope has a camera that will send pictures to a monitor in the operating room. The surgeon will make several other small incisions in your abdomen to insert the other instruments that are needed during the procedure. Another instrument (dilator) will be passed through your mouth and down your esophagus into the upper part of your stomach. The dilator will prevent your LES from being closed too tightly during surgery. The surgeon will pass the top portion of your stomach behind the lower part of your esophagus and wrap it all the way around. This will be stitched into place. If you have a hiatal hernia, it will be repaired during this procedure. All instruments will be removed, and your incisions will be closed under your skin with stitches (sutures). Skin adhesive strips may also be used. A bandage (dressing) will be placed on your skin over the incisions. The procedure may vary among health care providers and hospitals. What happens after the procedure? You will be moved to a recovery area. Your blood pressure, heart rate, breathing rate, and blood oxygen level will be monitored often until the medicines you were given have worn off. You will be given pain medicine as needed. Your IV tube will be kept in until you are able to drink fluids. This information is not intended to replace advice given to you by your health care provider. Make sure you discuss any questions you have with your health care provider. Document Released: 03/11/2014 Document Revised: 07/27/2015 Document Reviewed: 10/20/2013 Elsevier Interactive Patient  Education  2017 [employer]   Laparoscopic Nissen Fundoplication, Care After Refer to this sheet in the next few weeks. These instructions provide you with information about caring for yourself after your procedure. Your health care provider may also give you more specific instructions. Your treatment has been planned according to current medical practices, but problems sometimes occur. Call your health care provider if you have any problems or questions after your procedure. What can I expect after the procedure? After the procedure, it is common to have: Difficulty swallowing (dysphagia). Excess gas (bloating). Follow these instructions at home: Medicines  Take medicines only as directed by your health care provider. Do not drive or operate heavy machinery while taking pain medicine. Incision care  There are many different ways to close and cover an incision, including stitches (sutures), skin glue, and adhesive strips. Follow your health care provider's instructions about: Incision care. Bandage (dressing) changes and removal. Incision closure removal. Check your incision areas every day for signs of infection. Watch for: Redness, swelling, or pain. Fluid, blood, or pus. Do not take baths, swim, or use a hot tub until your health care provider approves. Take showers as directed by your health care provider. Eating and drinking  Follow your health care provider's instructions about eating. You may need to follow a very soft diet for 2 weeks,  followed by a diet of more regular foods for 2 weeks, no breads. You should return to your usual diet gradually. Drink enough fluid to keep your urine clear or pale yellow. Activity  Return to your normal activities as directed by your health care provider. Ask your health care provider what activities are safe for you. Avoid strenuous exercise. Do not lift anything that is heavier than 10 lb (4.5 kg). Ask your health care provider when you  can: Return to sexual activity. Drive. Go back to work. Contact a health care provider if: You have a fever. Your pain gets worse or is not helped by medicine. You have frequent nausea or vomiting. You have continued abdominal bloating. You have an ongoing (persistent) cough. You have redness, swelling, or pain in any incision areas. You have fluid, blood, or pus coming from any incisions. Get help right away if: You have trouble breathing. You are unable to swallow. You have persistent vomiting. You have blood in your vomit. You have severe abdominal pain. This information is not intended to replace advice given to you by your health care provider. Make sure you discuss any questions you have with your health care provider. Document Released: 10/12/2003 Document Revised: 07/27/2015 Document Reviewed: 10/20/2013 Elsevier Interactive Patient Education  2017 [employer].   Diet After Nissen Fundoplication Surgery This diet information is for patients who have recently had Nissen fundoplication surgery to correct reflux disease or to repair various types of hernias, such as hiatal hernia and intrathoracic stomach. This diet may also be used for other gastrointestinal surgeries, such as Heller myotomy and repair of achalasia. The diet will help control diarrhea, excess gas and swallowing problems, which may occur after this type of surgery. Keeping Your Stomach from Stretching Eat small, frequent meals (six to eight per day). This will help you consume the majority of the nutrients you need without causing your stomach to feel full or distended.  Drinking large amounts of fluids with meals can stretch your stomach. You may drink fluids between meals as often as you like, but limit fluids to 1/2 cup (4 fluid ounces) with meals and one cup (8 fluid ounces) with snacks.  Sit upright while eating and stay upright for 30 minutes after each meal. Gravity can help food move through your digestive  tract. Do not lie down after eating. Sit upright for 2 hours after your last meal or snack of the day.  Eat very slowly. Take your time when eating.  Take small bites and chew your food well to help aid in swallowing and digestion.  Avoid crusty breads and sticky, gummy foods, such as bananas, fresh doughy breads, rolls and doughnuts. These types of foods become sticky and difficult to swallow.  Toasted breads tend to be better tolerated.  Lastly, if you eat sweets, consume them at the end of your meal to avoid a group of symptoms referred to as "dumping syndrome". This describes the rapid emptying of foods from the stomach to the small intestine. Sweetened beverages, candy and desserts move more rapidly and dump quickly into the intestines. This can cause symptoms of nausea, weakness, cold sweats, cramps, diarrhea and dizzy spells.  Avoiding Gas Avoid drinking through a straw. Do not chew gum or tobacco. These actions cause you to swallow air, which produces excess gas in your stomach. Chew with your mouth closed.  Avoid any foods that cause stomach gas and distention. These foods include corn, dried beans, peas, lentils, onions, broccoli, cauliflower and  any food from the cabbage family.  Avoid carbonated drinks, alcohol, citrus and tomato products.  When will I be able to eat a soft diet? After Nissen fundoplication surgery, your diet will be advanced slowly by your surgeon. Generally, you will be on a clear liquid diet for the first few meals. Then you will advance to the full liquid diet for a meal or two and eventually to a Nissen soft diet. Please be aware that each patient's tolerance to food is different. Your doctor will advance your diet depending on how well you progress after surgery. Clear Liquid Diet  The first diet after surgery is the clear liquid diet. It includes the following liquids: Apple juice  Cranberry juice  Grape juice  Chicken broth  Beef broth  Flavored gelatin  (Jell-O)  Decaf tea and coffee  Caffeinated beverages are permitted based on tolerance  Popsicles  [place] ice Carbonated drinks (sodas) are not allowed for the first six to eight weeks after surgery. After this time you can try them again in small amounts.  Full Liquid Diet The full liquid diet contains anything on the clear liquid diet, plus: Milk, soy, rice and almond (no chocolate)  Cream of wheat, cream of rice, grits  Strained creamed soups (no tomato or broccoli)  Vanilla and strawberry-flavored ice cream  Sherbet  Blended, custard styled or whipped yogurt (plain or vanilla only)  Vanilla and butterscotch pudding (no chocolate or coconut)  Nutritional drinks including Ensure, Boost, Carnation Instant Breakfast (no chocolate-flavored) Note: Dairy products, such as milk, ice cream and pudding, may cause diarrhea in some people just after surgery. You may need to avoid milk products. If so, substitute them with lactose-free beverages, such as soy, rice, Lactaid or almond milks.  Nissen Soft Diet Food Category Foods to Choose Foods to Avoid  Beverages Milk, such as, whole, 2%, 1%, non-fat, or skim, soy, rice, almond  Caffeinated and decaf tea and coffee  Powdered drink mixes (in moderation)  Non-citrus juices (apple, grape, cranberry or blends of these)  Fruit nectars  Nutritional drinks including Boost, Ensure, Carnation Instant Breakfast Chocolate milk, cocoa or other chocolate-flavored drinks  Carbonated drinks  Alcohol  Citrus juices like orange, grapefruit, lemon and lime  Breads Pancakes, [place] toast and waffles  Crackers (saltine, butter, soda, graham, Goldfish and Cheese Nips)  Toasted bread Untoasted bread, bagels, [place] and hard rolls, English muffins  Crackers with nuts, seeds, fresh or dried fruit, coconut, or highly seasoned, such as garlic or onion-flavored  Sweet rolls, coffee cake or doughnuts  Cereals Well cooked cereals, such as oatmeal (plain or  flavored)  Cold cereal (Cornflakes, Rice Krispies, Cheerios, Special K plain, Rice Chex and puffed rice) Very coarse cereal, such as bran, shredded wheat  Any cereal with fresh or dried fruit, coconut, seeds or nuts  Desserts Eat in moderation and do not eat desserts or sweets by themselves. Plain cakes, cookies and cream-filled pies  Vanilla and butterscotch pudding or custard  Ice cream, ice milk, frozen yogurt and sherbet  Gelatin made from allowed foods  Fruit ices and popsicles Desserts containing chocolate, coconut, nuts, seeds, fresh or dried fruit, peppermint or spearmint  Eggs  Poached, hard boiled or scrambled Fried eggs and highly seasoned eggs (deviled eggs)  Fats Eat in moderation. Butter and margarine  Mayonnaise and vegetable oils  Mildly seasoned cream sauces and gravies  Plain cream cheese  Sour cream Highly seasoned salad dressings, cream sauces and gravies  Bacon, bacon fat, ham fat,  lard and salt pork  Fried foods  Nuts  Fruits Fruit juice  Any canned or cooked fruit except those listed in the AVOID column ALL fresh fruits, such as citrus, bananas and pineapple  Canned pineapple  Dried fruits, such as raisins, berries  Fruits with seeds, such as berries, kiwi and figs  Meat, Fish, Poultry, and [employer] may be ground, minced or chopped to ease swallowing and digestion  Tender, well cooked and moist cuts of beef, chicken, [place] and pork  Veal and lamb  Flaky, cooked fish  Canned tuna  Cottage and ricotta cheeses  Mild cheese, such as American, brick, mozzarella and baby Swiss  Creamy peanut butter  Plain custard or blended fruit yogurt  Moist casseroles, such as macaroni & cheese, tuna noodle  Grilled or toasted cheese sandwich Tough meats with a lot of gristle  Fried, highly seasoned, smoked and fatty meat, fish or poultry, such as frankfurters, luncheon meats, sausage, bacon, spare ribs, beef brisket, sardines, anchovies, duck and goose  Chili  and other entrees made with pepper or chili pepper  Shellfish  Strongly flavored cheeses, such as sharp cheese, extra sharp cheddar, cheese containing peppers or other seasonings  Crunchy peanut butter  Any yogurt with nuts, seeds, coconut, strawberries or raspberries  Potatoes and Starches Peeled, mashed or boiled white or sweet potatoes  Oven-baked potatoes without skin  Well cooked white rice, enriched noodles, barley, spaghetti, macaroni and other pastas Fried potatoes, potato skins and potato chips  Hard and soft taco shells  Fried, brown or wild rice  Soups Mildly flavored meat stocks  Cream soups made from allowed foods Highly seasoned soups and tomato based soups, cream soups made with gas producing vegetables, such as broccoli, cauliflower, onion, etc.  Sweets and Snacks Use in moderation and do not eat large amounts of sweets by themselves. Syrup, honey, jelly and seedless jam  Plain hard candies and plain candies made with allowed ingredients  Molasses  Marshmallows  Other candy made from allowed ingredients  Thin pretzels Jam, marmalade and preserves  Chocolate in any form  Any candy containing nuts, coconut, seeds, peppermint, spearmint or dried or fresh fruit  Popcorn, potato chips, tortilla chips  Soft or hard thick pretzels, such as sourdough  Vegetables Well cooked soft vegetables without seeds or skins, such as asparagus tips, beets, carrots, green and wax beans, chopped spinach, tender canned baby peas, squash and pumpkin Raw vegetables, tomatoes, tomato juice, tomato sauce and V-8 juice  Gas producing vegetables, such as broccoli, Brussel sprouts, cabbage, cauliflower, onions, corn, cucumber, green peppers, rutabagas, turnips, radishes and sauerkraut  Dried beans, peas and lentils  Miscellaneous Salt and spices in moderation  Mustard and vinegar in moderation Fried or highly seasoned foods  Coconut and seeds  Pickles and olives  Chili sauces, ketchup, barbecue  sauce, horseradish, black pepper, chili powder and onion and garlic seasonings  Any other strongly flavored seasoning, condiment, spice or herb not tolerated  Any food not tolerated

## 2022-03-12 ENCOUNTER — Telehealth: Payer: Self-pay | Admitting: Surgery

## 2022-03-12 NOTE — Telephone Encounter (Signed)
Patient has been advised of Pre-Admission date/time, and Surgery date at [facility].  Surgery Date: 03/28/22 Preadmission Testing Date: 03/19/22 (phone 1p-5p)  Patient has been made aware to call [phone], between 1-3:00pm the day before surgery, to find out what time to arrive for surgery.

## 2022-03-15 NOTE — Progress Notes (Signed)
Outpatient Surgical Follow Up  03/15/2022  Andrea [name] is an 57 y.o. female.   Chief Complaint  Patient presents with   Follow-up    Discuss surgery    HPI: 57 yo  female seen in F/U  for recalcitrant reflux. Endorses heartburn that is not controlled by trying multtiple PPI.  She also reports chronic cough and hoarseness. There went recent barium swallow that I personally reviewed showing significant reflux  into the thoracic inlet. Hx cholecystectomy and vaginal hysterectomy Wears 1 lt O2 at night.Active smoker 1ppd. She Did have an EGD 2 and half years ago that have personally reviewed showing a small hiatal hernia but no other acute intra-abdominal abnormalities. Recent EGD small HH Some dyspnea on exertion due to COPD.  Does have a history of coronary artery diseas  Past Medical History:  Diagnosis Date   Anxiety    panic attacks, chest pain   Arthritis    COPD (chronic obstructive pulmonary disease) (HCC)    GERD (gastroesophageal reflux disease)    Hypertension    not medicated   Non-obstructive CAD in native artery    a. cardiac cath 01/2014: showed minor irregularities, mildly elevated left ventricular end-diastolic pressure and normal ejection fraction; b. 03/2015 MV: low risk.   Palpitations    a. 08/2016 Event Monitor: occas PVC's, two brief runs of SVT (4 and 7 beats).   PONV (postoperative nausea and vomiting)    Symptomatic PVC's (premature ventricular contractions)    Tobacco abuse    Wears dentures    partial upper and lower    Past Surgical History:  Procedure Laterality Date   CARDIAC CATHETERIZATION  01/03/14   CARDIAC CATHETERIZATION     CHOLECYSTECTOMY     COLONOSCOPY WITH PROPOFOL N/A 10/05/2019   Procedure: COLONOSCOPY WITH PROPOFOL;  Surgeon: Lucilla Lame, MD;  Location: [facility] ENDOSCOPY;  Service: Endoscopy;  Laterality: N/A;   ESOPHAGOGASTRODUODENOSCOPY (EGD) WITH PROPOFOL N/A 07/11/2016   Procedure: ESOPHAGOGASTRODUODENOSCOPY (EGD) WITH PROPOFOL;   Surgeon: Lucilla Lame, MD;  Location: [place];  Service: Endoscopy;  Laterality: N/A;   ESOPHAGOGASTRODUODENOSCOPY (EGD) WITH PROPOFOL N/A 10/05/2019   Procedure: ESOPHAGOGASTRODUODENOSCOPY (EGD) WITH PROPOFOL;  Surgeon: Lucilla Lame, MD;  Location: [facility] ENDOSCOPY;  Service: Endoscopy;  Laterality: N/A;   ESOPHAGOGASTRODUODENOSCOPY (EGD) WITH PROPOFOL N/A 02/21/2022   Procedure: ESOPHAGOGASTRODUODENOSCOPY (EGD) WITH PROPOFOL;  Surgeon: Lucilla Lame, MD;  Location: [facility] ENDOSCOPY;  Service: Endoscopy;  Laterality: N/A;   EYE SURGERY     IMAGE GUIDED SINUS SURGERY N/A 11/30/2014   Procedure: IMAGE GUIDED SINUS SURGERY;  Surgeon: Carloyn Manner, MD;  Location: [place];  Service: ENT;  Laterality: N/A;  GAVE DISK TO CE CE   MAXILLARY ANTROSTOMY Bilateral 11/30/2014   Procedure: MAXILLARY ANTROSTOMY;  Surgeon: Carloyn Manner, MD;  Location: [place];  Service: ENT;  Laterality: Bilateral;   SEPTOPLASTY N/A 11/30/2014   Procedure: SEPTOPLASTY;  Surgeon: Carloyn Manner, MD;  Location: [place];  Service: ENT;  Laterality: N/A;   SPHENOIDECTOMY Left 11/30/2014   Procedure: Coralee Pesa, ;  Surgeon: Carloyn Manner, MD;  Location: [place];  Service: ENT;  Laterality: Left;   VAGINAL HYSTERECTOMY  1995   vaginal    Family History  Problem Relation Age of Onset   Emphysema Father    Asthma Father    Heart disease Father    Heart attack Father    Arthritis Father        RA   Colon cancer Father    Hypertension  Mother    Breast cancer Neg Hx     Social History:  reports that she has been smoking cigarettes. She has a 20.00 pack-year smoking history. She has been exposed to tobacco smoke. She has never used smokeless tobacco. She reports that she does not drink alcohol and does not use drugs.  Allergies:  Allergies  Allergen Reactions   Alum & Mag Hydroxide-Simeth Diarrhea and Nausea Only   Bactrim [Sulfamethoxazole-Trimethoprim]  Itching   Cefdinir Other (See Comments)    tachycardia   Chantix [Varenicline]     Bad dreams   Doxycycline Other (See Comments)    Nausea, migraine   Multaq [Dronedarone] Other (See Comments)    Lip/ mouth numbness/ tongue swelling   Ivp Dye [Iodinated Contrast Media] Palpitations    Medications reviewed.    ROS Full ROS performed and is otherwise negative other than what is stated in HPI   BP (!) 144/80   Pulse 90   Temp 97.6 F (36.4 C) (Oral)   Ht '5\' 3"'$  (1.6 m)   Wt 164 lb 3.2 oz (74.5 kg)   SpO2 98%   BMI 29.09 kg/m   Physical Exam Physical Exam CONSTITUTIONAL: NAD EYES: Pupils are equal, round,  Sclera are non-icteric. EARS, NOSE, MOUTH AND THROAT: The oropharynx is clear. The oral mucosa is pink and moist. Hearing is intact to voice. LYMPH NODES:  Lymph nodes in the neck are normal. RESPIRATORY:  Lungs are clear. There is normal respiratory effort, with equal breath sounds bilaterally, and without pathologic use of accessory muscles. CARDIOVASCULAR: Heart is regular without murmurs, gallops, or rubs. GI: The abdomen is  soft, nontender, and nondistended. There are no palpable masses. There is no hepatosplenomegaly. There are normal bowel sounds in all quadrants. Left inguinal scar GU: Rectal deferred.   MUSCULOSKELETAL: Normal muscle strength and tone. No cyanosis or edema.   SKIN: Turgor is good and there are no pathologic skin lesions or ulcers. NEUROLOGIC: Motor and sensation is grossly normal. Cranial nerves are grossly intact. PSYCH:  Oriented to person, place and time. Affect is normal   Assessment/Plan: 57 year old female with recalcitrant reflux and small sliding hiatal hernia. There is no contraindication for antireflux procedure and patient wishes to be off the current medications for reflux.  I discussed with her in detail about my thought process and I definitely agree that antireflux procedure will be able to get her off the PPIs a.   fundoplication  Nissen robotically will be indicated.  Procedure discussed with the patient in detail.  Risks, benefits and possible implications including but not limited to, bleeding, infection, bowel injuries and esophageal injuries, dysphagia, bloating.  She agrees and wishes to proceed Please note that I spent greater than 40 minutes in this encounter including extensive review of medical records, personally reviewing imaging studies, counseling the patient and placing orders and performing appropriate documentation    Caroleen Hamman, MD [place] Surgeon Caroleen Hamman, MD [facility] General Surgeon

## 2022-03-15 NOTE — H&P (View-Only) (Signed)
Outpatient Surgical Follow Up  03/15/2022  Andrea [name] is an 57 y.o. female.   Chief Complaint  Patient presents with   Follow-up    Discuss surgery    HPI: 57 yo  female seen in F/U  for recalcitrant reflux. Endorses heartburn that is not controlled by trying multtiple PPI.  She also reports chronic cough and hoarseness. There went recent barium swallow that I personally reviewed showing significant reflux  into the thoracic inlet. Hx cholecystectomy and vaginal hysterectomy Wears 1 lt O2 at night.Active smoker 1ppd. She Did have an EGD 2 and half years ago that have personally reviewed showing a small hiatal hernia but no other acute intra-abdominal abnormalities. Recent EGD small HH Some dyspnea on exertion due to COPD.  Does have a history of coronary artery diseas  Past Medical History:  Diagnosis Date   Anxiety    panic attacks, chest pain   Arthritis    COPD (chronic obstructive pulmonary disease) (HCC)    GERD (gastroesophageal reflux disease)    Hypertension    not medicated   Non-obstructive CAD in native artery    a. cardiac cath 01/2014: showed minor irregularities, mildly elevated left ventricular end-diastolic pressure and normal ejection fraction; b. 03/2015 MV: low risk.   Palpitations    a. 08/2016 Event Monitor: occas PVC's, two brief runs of SVT (4 and 7 beats).   PONV (postoperative nausea and vomiting)    Symptomatic PVC's (premature ventricular contractions)    Tobacco abuse    Wears dentures    partial upper and lower    Past Surgical History:  Procedure Laterality Date   CARDIAC CATHETERIZATION  01/03/14   CARDIAC CATHETERIZATION     CHOLECYSTECTOMY     COLONOSCOPY WITH PROPOFOL N/A 10/05/2019   Procedure: COLONOSCOPY WITH PROPOFOL;  Surgeon: Lucilla Lame, MD;  Location: [facility] ENDOSCOPY;  Service: Endoscopy;  Laterality: N/A;   ESOPHAGOGASTRODUODENOSCOPY (EGD) WITH PROPOFOL N/A 07/11/2016   Procedure: ESOPHAGOGASTRODUODENOSCOPY (EGD) WITH PROPOFOL;   Surgeon: Lucilla Lame, MD;  Location: [place];  Service: Endoscopy;  Laterality: N/A;   ESOPHAGOGASTRODUODENOSCOPY (EGD) WITH PROPOFOL N/A 10/05/2019   Procedure: ESOPHAGOGASTRODUODENOSCOPY (EGD) WITH PROPOFOL;  Surgeon: Lucilla Lame, MD;  Location: [facility] ENDOSCOPY;  Service: Endoscopy;  Laterality: N/A;   ESOPHAGOGASTRODUODENOSCOPY (EGD) WITH PROPOFOL N/A 02/21/2022   Procedure: ESOPHAGOGASTRODUODENOSCOPY (EGD) WITH PROPOFOL;  Surgeon: Lucilla Lame, MD;  [facility] ENDOSCOPY;  Service: Endoscopy;  Laterality: N/A;   EYE SURGERY     IMAGE GUIDED SINUS SURGERY N/A 11/30/2014   Procedure: IMAGE GUIDED SINUS SURGERY;  Surgeon: Carloyn Manner, MD;  Location: [place];  Service: ENT;  Laterality: N/A;  GAVE DISK TO CE CE   MAXILLARY ANTROSTOMY Bilateral 11/30/2014   Procedure: MAXILLARY ANTROSTOMY;  Surgeon: Carloyn Manner, MD;  Location: [place];  Service: ENT;  Laterality: Bilateral;   SEPTOPLASTY N/A 11/30/2014   Procedure: SEPTOPLASTY;  Surgeon: Carloyn Manner, MD;  Location: [place];  Service: ENT;  Laterality: N/A;   SPHENOIDECTOMY Left 11/30/2014   Procedure: Coralee Pesa, ;  Surgeon: Carloyn Manner, MD;  Location: [place];  Service: ENT;  Laterality: Left;   VAGINAL HYSTERECTOMY  1995   vaginal    Family History  Problem Relation Age of Onset   Emphysema Father    Asthma Father    Heart disease Father    Heart attack Father    Arthritis Father        RA   Colon cancer Father    Hypertension  Mother    Breast cancer Neg Hx     Social History:  reports that she has been smoking cigarettes. She has a 20.00 pack-year smoking history. She has been exposed to tobacco smoke. She has never used smokeless tobacco. She reports that she does not drink alcohol and does not use drugs.  Allergies:  Allergies  Allergen Reactions   Alum & Mag Hydroxide-Simeth Diarrhea and Nausea Only   Bactrim [Sulfamethoxazole-Trimethoprim]  Itching   Cefdinir Other (See Comments)    tachycardia   Chantix [Varenicline]     Bad dreams   Doxycycline Other (See Comments)    Nausea, migraine   Multaq [Dronedarone] Other (See Comments)    Lip/ mouth numbness/ tongue swelling   Ivp Dye [Iodinated Contrast Media] Palpitations    Medications reviewed.    ROS Full ROS performed and is otherwise negative other than what is stated in HPI   BP (!) 144/80   Pulse 90   Temp 97.6 F (36.4 C) (Oral)   Ht '5\' 3"'$  (1.6 m)   Wt 164 lb 3.2 oz (74.5 kg)   SpO2 98%   BMI 29.09 kg/m   Physical Exam Physical Exam CONSTITUTIONAL: NAD EYES: Pupils are equal, round,  Sclera are non-icteric. EARS, NOSE, MOUTH AND THROAT: The oropharynx is clear. The oral mucosa is pink and moist. Hearing is intact to voice. LYMPH NODES:  Lymph nodes in the neck are normal. RESPIRATORY:  Lungs are clear. There is normal respiratory effort, with equal breath sounds bilaterally, and without pathologic use of accessory muscles. CARDIOVASCULAR: Heart is regular without murmurs, gallops, or rubs. GI: The abdomen is  soft, nontender, and nondistended. There are no palpable masses. There is no hepatosplenomegaly. There are normal bowel sounds in all quadrants. Left inguinal scar GU: Rectal deferred.   MUSCULOSKELETAL: Normal muscle strength and tone. No cyanosis or edema.   SKIN: Turgor is good and there are no pathologic skin lesions or ulcers. NEUROLOGIC: Motor and sensation is grossly normal. Cranial nerves are grossly intact. PSYCH:  Oriented to person, place and time. Affect is normal   Assessment/Plan: 57 year old female with recalcitrant reflux and small sliding hiatal hernia. There is no contraindication for antireflux procedure and patient wishes to be off the current medications for reflux.  I discussed with her in detail about my thought process and I definitely agree that antireflux procedure will be able to get her off the PPIs a.   fundoplication  Nissen robotically will be indicated.  Procedure discussed with the patient in detail.  Risks, benefits and possible implications including but not limited to, bleeding, infection, bowel injuries and esophageal injuries, dysphagia, bloating.  She agrees and wishes to proceed Please note that I spent greater than 40 minutes in this encounter including extensive review of medical records, personally reviewing imaging studies, counseling the patient and placing orders and performing appropriate documentation    Caroleen Hamman, MD [place] Surgeon Caroleen Hamman, MD [facility] General Surgeon

## 2022-03-19 ENCOUNTER — Encounter
Admission: RE | Admit: 2022-03-19 | Discharge: 2022-03-19 | Disposition: A | Discharge status: Home / Self Care | Payer: 59 | Source: Ambulatory Visit | Attending: Surgery | Admitting: Surgery

## 2022-03-19 ENCOUNTER — Other Ambulatory Visit: Payer: Self-pay

## 2022-03-19 VITALS — Ht 63.0 in | Wt 160.0 lb

## 2022-03-19 DIAGNOSIS — J9601 Acute respiratory failure with hypoxia: Secondary | ICD-10-CM

## 2022-03-19 DIAGNOSIS — E785 Hyperlipidemia, unspecified: Secondary | ICD-10-CM

## 2022-03-19 DIAGNOSIS — E611 Iron deficiency: Secondary | ICD-10-CM

## 2022-03-19 DIAGNOSIS — I493 Ventricular premature depolarization: Secondary | ICD-10-CM

## 2022-03-19 DIAGNOSIS — I10 Essential (primary) hypertension: Secondary | ICD-10-CM

## 2022-03-19 HISTORY — DX: COVID-19: U07.1

## 2022-03-19 HISTORY — DX: Personal history of other diseases of the digestive system: Z87.19

## 2022-03-19 NOTE — Patient Instructions (Signed)
Your procedure is scheduled on: 03/28/22 Report to [place]. To find out your arrival time please call [phone] between 1PM - 3PM on 03/27/22.  Remember: Instructions that are not followed completely may result in serious medical risk, up to and including death, or upon the discretion of your surgeon and anesthesiologist your surgery may need to be rescheduled.     _X__ 1. Do not eat food after midnight the night before your procedure.                 No gum chewing or hard candies. You may drink clear liquids up to 2 hours                 before you are scheduled to arrive for your surgery- DO not drink clear                 liquids within 2 hours of the start of your surgery.                 Clear Liquids include:  water, apple juice without pulp, clear carbohydrate                 drink such as Clearfast or Gatorade, Black Coffee or Tea (Do not add                 anything to coffee or tea). Diabetics water only  __X__2.  On the morning of surgery brush your teeth with toothpaste and water, you                 may rinse your mouth with mouthwash if you wish.  Do not swallow any              toothpaste of mouthwash.     _X__ 3.  No Alcohol for 24 hours before or after surgery.   _X__ 4.  Do Not Smoke or use e-cigarettes For 24 Hours Prior to Your Surgery.                 Do not use any chewable tobacco products for at least 6 hours prior to                 surgery.  ____  5.  Bring all medications with you on the day of surgery if instructed.   __X__  6.  Notify your doctor if there is any change in your medical condition      (cold, fever, infections).     Do not wear jewelry, make-up, hairpins, clips or nail polish. Do not wear lotions, powders, or perfumes. You may wear deodorant. Do not shave body hair 48 hours prior to surgery. Men may shave face and neck. Do not bring valuables to the hospital.    [name] is not  responsible for any belongings or valuables.  Contacts, dentures/partials or body piercings may not be worn into surgery. Bring a case for your contacts, glasses or hearing aids, a denture cup will be supplied. Leave your suitcase in the car. After surgery it may be brought to your room. For patients admitted to the hospital, discharge time is determined by your treatment team.   Patients discharged the day of surgery will need an adult driver. You will not be allowed to drive yourself home, take an uber, lyft or taxi. You may use medical transportation such as [place] as long as you have  an adult with you or waiting to assist you upon your arrival home. You must have an adult over 15 years old in the home with you for the first 24 hours after surgery   __X__ Take these medicines the morning of surgery with A SIP OF WATER:    1. atorvastatin (LIPITOR) 20 MG tablet   2. dexlansoprazole (DEXILANT) 60 MG capsule   3. clonazePAM (KLONOPIN) 1 MG tablet if needed  4.  5.  6.  ____ Fleet Enema (as directed)   ____ Use CHG Soap/SAGE wipes as directed  __X__ Use inhalers on the day of surgery  Use your nebulizer and your Breztri the morning of your surgery  ____ Stop metformin/Janumet/Farxiga 2 days prior to surgery    ____ Take 1/2 of usual insulin dose the night before surgery. No insulin the morning          of surgery.   ____ Stop Blood Thinners Coumadin/Plavix/Xarelto/Pleta/Pradaxa/Eliquis/Effient/Aspirin  on   Or contact your Surgeon, Cardiologist or Medical Doctor regarding  ability to stop your blood thinners  __X__ Stop Anti-inflammatories 7 days before surgery such as Advil, Ibuprofen, Motrin,  BC or Goodies Powder, Naprosyn, Naproxen, Aleve, Aspirin  You may take Tylenol as needed   __X__ Stop all herbals and supplements, fish oil or vitamins  until after surgery.    ____ Bring C-Pap to the hospital.   For your EKG Pre Admit Testing Office [facility] [address]  [place] (at [facility])

## 2022-03-20 ENCOUNTER — Encounter: Payer: Self-pay | Admitting: Urgent Care

## 2022-03-20 ENCOUNTER — Encounter
Admission: RE | Admit: 2022-03-20 | Discharge: 2022-03-20 | Disposition: A | Discharge status: Home / Self Care | Payer: 59 | Source: Ambulatory Visit | Attending: Surgery | Admitting: Surgery

## 2022-03-20 ENCOUNTER — Other Ambulatory Visit: Payer: 59

## 2022-03-20 DIAGNOSIS — E785 Hyperlipidemia, unspecified: Secondary | ICD-10-CM

## 2022-03-20 DIAGNOSIS — E611 Iron deficiency: Secondary | ICD-10-CM | POA: Diagnosis not present

## 2022-03-20 DIAGNOSIS — J9601 Acute respiratory failure with hypoxia: Secondary | ICD-10-CM

## 2022-03-20 DIAGNOSIS — I493 Ventricular premature depolarization: Secondary | ICD-10-CM | POA: Diagnosis not present

## 2022-03-20 DIAGNOSIS — Z01818 Encounter for other preprocedural examination: Secondary | ICD-10-CM | POA: Diagnosis not present

## 2022-03-20 DIAGNOSIS — Z01810 Encounter for preprocedural cardiovascular examination: Secondary | ICD-10-CM | POA: Diagnosis not present

## 2022-03-20 DIAGNOSIS — I10 Essential (primary) hypertension: Secondary | ICD-10-CM | POA: Diagnosis not present

## 2022-03-20 LAB — BASIC METABOLIC PANEL
Anion gap: 7 (ref 5–15)
BUN: 14 mg/dL (ref 6–20)
CO2: 31 mmol/L (ref 22–32)
Calcium: 9.2 mg/dL (ref 8.9–10.3)
Chloride: 102 mmol/L (ref 98–111)
Creatinine, Ser: 0.74 mg/dL (ref 0.44–1.00)
GFR, Estimated: >60 mL/min (ref >60)
Glucose, Bld: 90 mg/dL (ref 70–99)
Potassium: 4 mmol/L (ref 3.5–5.1)
Sodium: 140 mmol/L (ref 135–145)

## 2022-03-20 LAB — CBC
HCT: 43.4 % (ref 36.0–46.0)
Hemoglobin: 13.5 g/dL (ref 12.0–15.0)
MCH: 25.6 pg — ABNORMAL LOW (ref 26.0–34.0)
MCHC: 31.1 g/dL (ref 30.0–36.0)
MCV: 82.2 fL (ref 80.0–100.0)
Platelets: 300 10*3/uL (ref 150–400)
RBC: 5.28 MIL/uL — ABNORMAL HIGH (ref 3.87–5.11)
RDW: 15.1 % (ref 11.5–15.5)
WBC: 7.4 10*3/uL (ref 4.0–10.5)
nRBC: 0 % (ref 0.0–0.2)

## 2022-03-24 DIAGNOSIS — J9621 Acute and chronic respiratory failure with hypoxia: Secondary | ICD-10-CM | POA: Diagnosis not present

## 2022-03-25 DIAGNOSIS — J449 Chronic obstructive pulmonary disease, unspecified: Secondary | ICD-10-CM | POA: Diagnosis not present

## 2022-03-26 ENCOUNTER — Other Ambulatory Visit: Payer: Self-pay | Admitting: Pulmonary Disease

## 2022-03-28 ENCOUNTER — Encounter: Payer: Self-pay | Admitting: Surgery

## 2022-03-28 ENCOUNTER — Ambulatory Visit: Payer: 59 | Admitting: Certified Registered"

## 2022-03-28 ENCOUNTER — Other Ambulatory Visit: Payer: Self-pay

## 2022-03-28 ENCOUNTER — Observation Stay
Admission: RE | Admit: 2022-03-28 | Discharge: 2022-03-29 | Disposition: A | Discharge status: Home / Self Care | Payer: 59 | Attending: Surgery | Admitting: Surgery

## 2022-03-28 ENCOUNTER — Encounter
Admission: RE | Disposition: A | Discharge status: Home / Self Care | Payer: Self-pay | Source: Home / Self Care | Attending: Surgery

## 2022-03-28 DIAGNOSIS — I2510 Atherosclerotic heart disease of native coronary artery without angina pectoris: Secondary | ICD-10-CM | POA: Insufficient documentation

## 2022-03-28 DIAGNOSIS — Z8719 Personal history of other diseases of the digestive system: Secondary | ICD-10-CM

## 2022-03-28 DIAGNOSIS — K219 Gastro-esophageal reflux disease without esophagitis: Secondary | ICD-10-CM | POA: Insufficient documentation

## 2022-03-28 DIAGNOSIS — I10 Essential (primary) hypertension: Secondary | ICD-10-CM | POA: Diagnosis not present

## 2022-03-28 DIAGNOSIS — K449 Diaphragmatic hernia without obstruction or gangrene: Secondary | ICD-10-CM | POA: Diagnosis not present

## 2022-03-28 DIAGNOSIS — J449 Chronic obstructive pulmonary disease, unspecified: Secondary | ICD-10-CM | POA: Diagnosis not present

## 2022-03-28 DIAGNOSIS — F17200 Nicotine dependence, unspecified, uncomplicated: Secondary | ICD-10-CM | POA: Diagnosis not present

## 2022-03-28 DIAGNOSIS — F17210 Nicotine dependence, cigarettes, uncomplicated: Secondary | ICD-10-CM | POA: Diagnosis not present

## 2022-03-28 HISTORY — PX: XI ROBOTIC ASSISTED PARAESOPHAGEAL HERNIA REPAIR: SHX6871

## 2022-03-28 HISTORY — PX: INSERTION OF MESH: SHX5868

## 2022-03-28 LAB — CBC
HCT: 39.7 % (ref 36.0–46.0)
Hemoglobin: 12.5 g/dL (ref 12.0–15.0)
MCH: 25.9 pg — ABNORMAL LOW (ref 26.0–34.0)
MCHC: 31.5 g/dL (ref 30.0–36.0)
MCV: 82.4 fL (ref 80.0–100.0)
Platelets: 241 10*3/uL (ref 150–400)
RBC: 4.82 MIL/uL (ref 3.87–5.11)
RDW: 15 % (ref 11.5–15.5)
WBC: 12.5 10*3/uL — ABNORMAL HIGH (ref 4.0–10.5)
nRBC: 0 % (ref 0.0–0.2)

## 2022-03-28 LAB — CREATININE, SERUM
Creatinine, Ser: 0.71 mg/dL (ref 0.44–1.00)
GFR, Estimated: >60 mL/min (ref >60)

## 2022-03-28 LAB — HIV ANTIBODY (ROUTINE TESTING W REFLEX): HIV Screen 4th Generation wRfx: NONREACTIVE

## 2022-03-28 SURGERY — REPAIR, HERNIA, PARAESOPHAGEAL, ROBOT-ASSISTED
Anesthesia: General

## 2022-03-28 MED ORDER — SUGAMMADEX SODIUM 200 MG/2ML IV SOLN
INTRAVENOUS | Status: DC | PRN
  Administered 2022-03-28: 200 mg via INTRAVENOUS

## 2022-03-28 MED ORDER — GABAPENTIN 300 MG PO CAPS: 300.0000 mg | ORAL_CAPSULE | ORAL | Status: DC

## 2022-03-28 MED ORDER — ZOLPIDEM TARTRATE 5 MG PO TABS: 5.0000 mg | ORAL_TABLET | Freq: Every evening | ORAL | Status: DC | PRN

## 2022-03-28 MED ORDER — CLONAZEPAM 1 MG PO TABS
1.0000 mg | ORAL_TABLET | Freq: Two times a day (BID) | ORAL | Status: DC | PRN
  Administered 2022-03-28: 1 mg via ORAL
  Filled 2022-03-28: qty 1

## 2022-03-28 MED ORDER — BUDESON-GLYCOPYRROL-FORMOTEROL 160-9-4.8 MCG/ACT IN AERO: 2.0000 | INHALATION_SPRAY | Freq: Two times a day (BID) | RESPIRATORY_TRACT | Status: DC

## 2022-03-28 MED ORDER — FENTANYL CITRATE (PF) 100 MCG/2ML IJ SOLN
INTRAMUSCULAR | Status: AC
  Administered 2022-03-28: 25 ug via INTRAVENOUS
  Filled 2022-03-28: qty 2

## 2022-03-28 MED ORDER — VISTASEAL 10 ML SINGLE DOSE KIT
PACK | CUTANEOUS | Status: DC | PRN
  Administered 2022-03-28: 10 mL via TOPICAL

## 2022-03-28 MED ORDER — DEXAMETHASONE SODIUM PHOSPHATE 10 MG/ML IJ SOLN
INTRAMUSCULAR | Status: AC
  Filled 2022-03-28: qty 1

## 2022-03-28 MED ORDER — LACTATED RINGERS IV SOLN: INTRAVENOUS | Status: DC

## 2022-03-28 MED ORDER — CEFAZOLIN SODIUM-DEXTROSE 2-4 GM/100ML-% IV SOLN
INTRAVENOUS | Status: AC
  Filled 2022-03-28: qty 100

## 2022-03-28 MED ORDER — GABAPENTIN 300 MG PO CAPS
ORAL_CAPSULE | ORAL | Status: AC
  Administered 2022-03-28: 300 mg
  Filled 2022-03-28: qty 1

## 2022-03-28 MED ORDER — DIPHENHYDRAMINE HCL 50 MG/ML IJ SOLN
12.5000 mg | Freq: Four times a day (QID) | INTRAMUSCULAR | Status: DC | PRN
  Administered 2022-03-28: 12.5 mg via INTRAVENOUS

## 2022-03-28 MED ORDER — ONDANSETRON 4 MG PO TBDP: 4.0000 mg | ORAL_TABLET | Freq: Four times a day (QID) | ORAL | Status: DC | PRN

## 2022-03-28 MED ORDER — PROPOFOL 1000 MG/100ML IV EMUL
INTRAVENOUS | Status: AC
  Filled 2022-03-28: qty 100

## 2022-03-28 MED ORDER — PHENYLEPHRINE HCL (PRESSORS) 10 MG/ML IV SOLN
INTRAVENOUS | Status: DC | PRN
  Administered 2022-03-28 (×2): 80 ug via INTRAVENOUS

## 2022-03-28 MED ORDER — ENOXAPARIN SODIUM 40 MG/0.4ML IJ SOSY
40.0000 mg | PREFILLED_SYRINGE | INTRAMUSCULAR | Status: DC
  Administered 2022-03-29: 40 mg via SUBCUTANEOUS
  Filled 2022-03-28: qty 0.4

## 2022-03-28 MED ORDER — BUPIVACAINE LIPOSOME 1.3 % IJ SUSP
INTRAMUSCULAR | Status: AC
  Filled 2022-03-28: qty 20

## 2022-03-28 MED ORDER — OXYCODONE HCL 5 MG PO TABS: 5.0000 mg | ORAL_TABLET | Freq: Once | ORAL | Status: DC | PRN

## 2022-03-28 MED ORDER — CHLORHEXIDINE GLUCONATE CLOTH 2 % EX PADS: 6.0000 | MEDICATED_PAD | Freq: Once | CUTANEOUS | Status: DC

## 2022-03-28 MED ORDER — CEFAZOLIN SODIUM-DEXTROSE 2-3 GM-%(50ML) IV SOLR
INTRAVENOUS | Status: DC | PRN
  Administered 2022-03-28: 2 g via INTRAVENOUS

## 2022-03-28 MED ORDER — BUPIVACAINE LIPOSOME 1.3 % IJ SUSP
INTRAMUSCULAR | Status: DC | PRN
  Administered 2022-03-28: 20 mL

## 2022-03-28 MED ORDER — CHLORHEXIDINE GLUCONATE 0.12 % MT SOLN: 15.0000 mL | Freq: Once | OROMUCOSAL | Status: DC

## 2022-03-28 MED ORDER — FENTANYL CITRATE (PF) 100 MCG/2ML IJ SOLN
25.0000 ug | INTRAMUSCULAR | Status: DC | PRN
  Administered 2022-03-28 (×2): 25 ug via INTRAVENOUS

## 2022-03-28 MED ORDER — MIDAZOLAM HCL 2 MG/2ML IJ SOLN
INTRAMUSCULAR | Status: AC
  Filled 2022-03-28: qty 2

## 2022-03-28 MED ORDER — MOMETASONE FURO-FORMOTEROL FUM 100-5 MCG/ACT IN AERO
2.0000 | INHALATION_SPRAY | Freq: Two times a day (BID) | RESPIRATORY_TRACT | Status: DC
  Administered 2022-03-28 – 2022-03-29 (×2): 2 via RESPIRATORY_TRACT
  Filled 2022-03-28: qty 8.8

## 2022-03-28 MED ORDER — METHOCARBAMOL 1000 MG/10ML IJ SOLN
500.0000 mg | Freq: Three times a day (TID) | INTRAVENOUS | Status: DC | PRN
  Administered 2022-03-28: 500 mg via INTRAVENOUS
  Filled 2022-03-28: qty 500

## 2022-03-28 MED ORDER — MORPHINE SULFATE (PF) 2 MG/ML IV SOLN: 2.0000 mg | INTRAVENOUS | Status: DC | PRN

## 2022-03-28 MED ORDER — PROPOFOL 500 MG/50ML IV EMUL
INTRAVENOUS | Status: DC | PRN
  Administered 2022-03-28: 150 ug/kg/min via INTRAVENOUS

## 2022-03-28 MED ORDER — NITROGLYCERIN 0.4 MG SL SUBL: 0.4000 mg | SUBLINGUAL_TABLET | SUBLINGUAL | Status: DC | PRN

## 2022-03-28 MED ORDER — LIDOCAINE HCL (CARDIAC) PF 100 MG/5ML IV SOSY
PREFILLED_SYRINGE | INTRAVENOUS | Status: DC | PRN
  Administered 2022-03-28: 100 mg via INTRAVENOUS

## 2022-03-28 MED ORDER — DIPHENHYDRAMINE HCL 12.5 MG/5ML PO ELIX: 12.5000 mg | ORAL_SOLUTION | Freq: Four times a day (QID) | ORAL | Status: DC | PRN

## 2022-03-28 MED ORDER — HYDRALAZINE HCL 20 MG/ML IJ SOLN: 10.0000 mg | INTRAMUSCULAR | Status: DC | PRN

## 2022-03-28 MED ORDER — KETOROLAC TROMETHAMINE 15 MG/ML IJ SOLN
INTRAMUSCULAR | Status: AC
  Filled 2022-03-28: qty 1

## 2022-03-28 MED ORDER — METHOCARBAMOL 500 MG PO TABS: 500.0000 mg | ORAL_TABLET | Freq: Three times a day (TID) | ORAL | Status: DC | PRN

## 2022-03-28 MED ORDER — OXYCODONE HCL 5 MG PO TABS
5.0000 mg | ORAL_TABLET | ORAL | Status: DC | PRN
  Administered 2022-03-28: 10 mg via ORAL
  Filled 2022-03-28: qty 2

## 2022-03-28 MED ORDER — OXYCODONE HCL 5 MG/5ML PO SOLN: 5.0000 mg | Freq: Once | ORAL | Status: DC | PRN

## 2022-03-28 MED ORDER — HYDROMORPHONE HCL 1 MG/ML IJ SOLN
INTRAMUSCULAR | Status: AC
  Filled 2022-03-28: qty 1

## 2022-03-28 MED ORDER — BUPIVACAINE HCL (PF) 0.25 % IJ SOLN
INTRAMUSCULAR | Status: AC
  Filled 2022-03-28: qty 30

## 2022-03-28 MED ORDER — ONDANSETRON HCL 4 MG/2ML IJ SOLN
INTRAMUSCULAR | Status: DC | PRN
  Administered 2022-03-28: 4 mg via INTRAVENOUS

## 2022-03-28 MED ORDER — ACETAMINOPHEN 500 MG PO TABS: 1000.0000 mg | ORAL_TABLET | ORAL | Status: DC

## 2022-03-28 MED ORDER — FLUTICASONE PROPIONATE 50 MCG/ACT NA SUSP: 2.0000 | NASAL | Status: DC | PRN

## 2022-03-28 MED ORDER — HYDROMORPHONE HCL 1 MG/ML IJ SOLN
INTRAMUSCULAR | Status: DC | PRN
  Administered 2022-03-28 (×2): .5 mg via INTRAVENOUS

## 2022-03-28 MED ORDER — LACTATED RINGERS IV SOLN: INTRAVENOUS | Status: DC | PRN

## 2022-03-28 MED ORDER — FENTANYL CITRATE (PF) 100 MCG/2ML IJ SOLN
INTRAMUSCULAR | Status: DC | PRN
  Administered 2022-03-28: 100 ug via INTRAVENOUS

## 2022-03-28 MED ORDER — ONDANSETRON HCL 4 MG/2ML IJ SOLN: 4.0000 mg | Freq: Four times a day (QID) | INTRAMUSCULAR | Status: DC | PRN

## 2022-03-28 MED ORDER — DIPHENHYDRAMINE HCL 50 MG/ML IJ SOLN
INTRAMUSCULAR | Status: AC
  Filled 2022-03-28: qty 1

## 2022-03-28 MED ORDER — ALBUTEROL SULFATE HFA 108 (90 BASE) MCG/ACT IN AERS: 1.0000 | INHALATION_SPRAY | Freq: Four times a day (QID) | RESPIRATORY_TRACT | Status: DC | PRN

## 2022-03-28 MED ORDER — LIDOCAINE HCL (PF) 2 % IJ SOLN
INTRAMUSCULAR | Status: AC
  Filled 2022-03-28: qty 5

## 2022-03-28 MED ORDER — DROPERIDOL 2.5 MG/ML IJ SOLN
INTRAMUSCULAR | Status: AC
  Administered 2022-03-28: 0.625 mg via INTRAVENOUS
  Filled 2022-03-28: qty 2

## 2022-03-28 MED ORDER — CHLORHEXIDINE GLUCONATE 0.12 % MT SOLN
OROMUCOSAL | Status: AC
  Filled 2022-03-28: qty 15

## 2022-03-28 MED ORDER — ONDANSETRON HCL 4 MG/2ML IJ SOLN
INTRAMUSCULAR | Status: AC
  Filled 2022-03-28: qty 2

## 2022-03-28 MED ORDER — DEXAMETHASONE SODIUM PHOSPHATE 10 MG/ML IJ SOLN
INTRAMUSCULAR | Status: DC | PRN
  Administered 2022-03-28: 10 mg via INTRAVENOUS

## 2022-03-28 MED ORDER — PROCHLORPERAZINE EDISYLATE 10 MG/2ML IJ SOLN: 5.0000 mg | Freq: Four times a day (QID) | INTRAMUSCULAR | Status: DC | PRN

## 2022-03-28 MED ORDER — DROPERIDOL 2.5 MG/ML IJ SOLN: 0.6250 mg | Freq: Once | INTRAMUSCULAR | Status: AC

## 2022-03-28 MED ORDER — BUPIVACAINE HCL 0.25 % IJ SOLN
INTRAMUSCULAR | Status: DC | PRN
  Administered 2022-03-28: 30 mL via INTRAMUSCULAR

## 2022-03-28 MED ORDER — ORAL CARE MOUTH RINSE: 15.0000 mL | Freq: Once | OROMUCOSAL | Status: DC

## 2022-03-28 MED ORDER — SODIUM CHLORIDE 0.9 % IV SOLN: INTRAVENOUS | Status: DC

## 2022-03-28 MED ORDER — ACETAMINOPHEN 500 MG PO TABS
1000.0000 mg | ORAL_TABLET | Freq: Four times a day (QID) | ORAL | Status: DC
  Administered 2022-03-28 – 2022-03-29 (×3): 1000 mg via ORAL
  Filled 2022-03-28 (×3): qty 2

## 2022-03-28 MED ORDER — ROCURONIUM BROMIDE 100 MG/10ML IV SOLN
INTRAVENOUS | Status: DC | PRN
  Administered 2022-03-28: 20 mg via INTRAVENOUS
  Administered 2022-03-28: 50 mg via INTRAVENOUS

## 2022-03-28 MED ORDER — ALBUTEROL SULFATE (2.5 MG/3ML) 0.083% IN NEBU: 2.5000 mg | INHALATION_SOLUTION | RESPIRATORY_TRACT | Status: DC | PRN

## 2022-03-28 MED ORDER — ROCURONIUM BROMIDE 10 MG/ML (PF) SYRINGE
PREFILLED_SYRINGE | INTRAVENOUS | Status: AC
  Filled 2022-03-28: qty 10

## 2022-03-28 MED ORDER — MIDAZOLAM HCL 2 MG/2ML IJ SOLN
INTRAMUSCULAR | Status: DC | PRN
  Administered 2022-03-28: 2 mg via INTRAVENOUS

## 2022-03-28 MED ORDER — CEFAZOLIN SODIUM-DEXTROSE 2-4 GM/100ML-% IV SOLN: 2.0000 g | INTRAVENOUS | Status: DC

## 2022-03-28 MED ORDER — PROPOFOL 10 MG/ML IV BOLUS
INTRAVENOUS | Status: AC
  Filled 2022-03-28: qty 20

## 2022-03-28 MED ORDER — BUPIVACAINE-EPINEPHRINE (PF) 0.5% -1:200000 IJ SOLN
INTRAMUSCULAR | Status: AC
  Filled 2022-03-28: qty 30

## 2022-03-28 MED ORDER — FENTANYL CITRATE (PF) 100 MCG/2ML IJ SOLN
INTRAMUSCULAR | Status: AC
  Filled 2022-03-28: qty 2

## 2022-03-28 MED ORDER — LEVALBUTEROL TARTRATE 45 MCG/ACT IN AERO: 2.0000 | INHALATION_SPRAY | RESPIRATORY_TRACT | Status: DC | PRN

## 2022-03-28 MED ORDER — PROCHLORPERAZINE MALEATE 10 MG PO TABS: 10.0000 mg | ORAL_TABLET | Freq: Four times a day (QID) | ORAL | Status: DC | PRN

## 2022-03-28 MED ORDER — PROPOFOL 10 MG/ML IV BOLUS
INTRAVENOUS | Status: DC | PRN
  Administered 2022-03-28: 130 mg via INTRAVENOUS

## 2022-03-28 MED ORDER — ACETAMINOPHEN 500 MG PO TABS
ORAL_TABLET | ORAL | Status: AC
  Administered 2022-03-28: 1000 mg
  Filled 2022-03-28: qty 2

## 2022-03-28 MED ORDER — UMECLIDINIUM BROMIDE 62.5 MCG/ACT IN AEPB
1.0000 | INHALATION_SPRAY | Freq: Every day | RESPIRATORY_TRACT | Status: DC
  Administered 2022-03-29: 1 via RESPIRATORY_TRACT
  Filled 2022-03-28: qty 7

## 2022-03-28 MED ORDER — KETOROLAC TROMETHAMINE 15 MG/ML IJ SOLN
15.0000 mg | Freq: Four times a day (QID) | INTRAMUSCULAR | Status: DC
  Administered 2022-03-28 – 2022-03-29 (×4): 15 mg via INTRAVENOUS
  Filled 2022-03-28 (×3): qty 1

## 2022-03-28 SURGICAL SUPPLY — 56 items
APPLICATOR VISTASEAL 35 (MISCELLANEOUS) IMPLANT
CANNULA REDUC XI 12-8 STAPL (CANNULA) ×2
CANNULA REDUCER 12-8 DVNC XI (CANNULA) ×2 IMPLANT
CLIP LIGATING HEM O LOK PURPLE (MISCELLANEOUS) IMPLANT
DERMABOND ADVANCED .7 DNX12 (GAUZE/BANDAGES/DRESSINGS) ×2 IMPLANT
DRAPE ARM DVNC X/XI (DISPOSABLE) ×8 IMPLANT
DRAPE COLUMN DVNC XI (DISPOSABLE) ×2 IMPLANT
DRAPE DA VINCI XI ARM (DISPOSABLE) ×8
DRAPE DA VINCI XI COLUMN (DISPOSABLE) ×2
ELECT CAUTERY BLADE 6.4 (BLADE) ×2 IMPLANT
ELECT REM PT RETURN 9FT ADLT (ELECTROSURGICAL) ×2
ELECTRODE REM PT RTRN 9FT ADLT (ELECTROSURGICAL) ×2 IMPLANT
GLOVE BIO SURGEON STRL SZ7 (GLOVE) ×6 IMPLANT
GOWN STRL REUS W/ TWL LRG LVL3 (GOWN DISPOSABLE) ×8 IMPLANT
GOWN STRL REUS W/TWL LRG LVL3 (GOWN DISPOSABLE) ×16
GRASPER LAPSCPC 5X45 DSP (INSTRUMENTS) ×2 IMPLANT
IRRIGATION STRYKERFLOW (MISCELLANEOUS) IMPLANT
IRRIGATOR STRYKERFLOW (MISCELLANEOUS) ×2
IV NS 1000ML (IV SOLUTION) ×2
IV NS 1000ML BAXH (IV SOLUTION) IMPLANT
KIT PINK PAD W/HEAD ARE REST (MISCELLANEOUS) ×2
KIT PINK PAD W/HEAD ARM REST (MISCELLANEOUS) ×2 IMPLANT
LABEL OR SOLS (LABEL) ×2 IMPLANT
MANIFOLD NEPTUNE II (INSTRUMENTS) ×2 IMPLANT
MESH BIO-A 7X10 SYN MAT (Mesh General) IMPLANT
NEEDLE HYPO 22GX1.5 SAFETY (NEEDLE) ×2 IMPLANT
OBTURATOR OPTICAL STANDARD 8MM (TROCAR) ×2
OBTURATOR OPTICAL STND 8 DVNC (TROCAR) ×2
OBTURATOR OPTICALSTD 8 DVNC (TROCAR) ×2 IMPLANT
PACK LAP CHOLECYSTECTOMY (MISCELLANEOUS) ×2 IMPLANT
PENCIL SMOKE EVACUATOR (MISCELLANEOUS) ×2 IMPLANT
SEAL CANN UNIV 5-8 DVNC XI (MISCELLANEOUS) ×6 IMPLANT
SEAL XI 5MM-8MM UNIVERSAL (MISCELLANEOUS) ×6
SEALER VESSEL DA VINCI XI (MISCELLANEOUS) ×2
SEALER VESSEL EXT DVNC XI (MISCELLANEOUS) ×2 IMPLANT
SOLUTION ELECTROLUBE (MISCELLANEOUS) ×2 IMPLANT
SPIKE FLUID TRANSFER (MISCELLANEOUS) ×2 IMPLANT
SPONGE T-LAP 18X18 ~~LOC~~+RFID (SPONGE) ×2 IMPLANT
STAPLER CANNULA SEAL DVNC XI (STAPLE) ×2 IMPLANT
STAPLER CANNULA SEAL XI (STAPLE) ×2
SUT MNCRL 4-0 (SUTURE) ×2
SUT MNCRL 4-0 27XMFL (SUTURE) ×2
SUT SILK 2 0 SH (SUTURE) ×4 IMPLANT
SUT VIC AB 3-0 SH 27 (SUTURE)
SUT VIC AB 3-0 SH 27X BRD (SUTURE) IMPLANT
SUT VICRYL 0 UR6 27IN ABS (SUTURE) ×4 IMPLANT
SUT VLOC 90 S/L VL9 GS22 (SUTURE) ×2 IMPLANT
SUTURE MNCRL 4-0 27XMF (SUTURE) ×2 IMPLANT
SYR 30ML LL (SYRINGE) ×2 IMPLANT
SYS BAG RETRIEVAL 10MM (BASKET)
SYSTEM BAG RETRIEVAL 10MM (BASKET) IMPLANT
TRAP FLUID SMOKE EVACUATOR (MISCELLANEOUS) ×2 IMPLANT
TRAY FOLEY SLVR 16FR LF STAT (SET/KITS/TRAYS/PACK) ×2 IMPLANT
TROCAR XCEL NON-BLD 5MMX100MML (ENDOMECHANICALS) ×2 IMPLANT
TUBING EVAC SMOKE HEATED PNEUM (TUBING) ×2 IMPLANT
WATER STERILE IRR 500ML POUR (IV SOLUTION) ×2 IMPLANT

## 2022-03-28 NOTE — Interval H&P Note (Signed)
History and Physical Interval Note:  03/28/2022 10:09 AM  Andrea [name]  has presented today for surgery, with the diagnosis of paraesophageal hernia.  The various methods of treatment have been discussed with the patient and family. After consideration of risks, benefits and other options for treatment, the patient has consented to  Procedure(s): XI ROBOTIC ASSISTED PARAESOPHAGEAL HERNIA REPAIR, RNFA to assist (N/A) as a surgical intervention.  The patient's history has been reviewed, patient examined, no change in status, stable for surgery.  I have reviewed the patient's chart and labs.  Questions were answered to the patient's satisfaction.     [place]

## 2022-03-28 NOTE — Transfer of Care (Signed)
Immediate Anesthesia Transfer of Care Note  Patient: Andrea [name]  Procedure(s) Performed: XI ROBOTIC ASSISTED PARAESOPHAGEAL HERNIA REPAIR, RNFA to assist INSERTION OF MESH  Patient Location: PACU  Anesthesia Type:General  Level of Consciousness: awake  Airway & Oxygen Therapy: Patient Spontanous Breathing and Patient connected to face mask oxygen  Post-op Assessment: Report given to RN and Post -op Vital signs reviewed and stable  Post vital signs: stable  Last Vitals:  Vitals Value Taken Time  BP 135/72 03/28/22 1300  Temp    Pulse 82 03/28/22 1306  Resp 18 03/28/22 1306  SpO2 100 % 03/28/22 1306  Vitals shown include unvalidated device data.  Last Pain:  Vitals:   03/28/22 1007  TempSrc: Oral  PainSc: 0-No pain      Patients Stated Pain Goal: 0 (83/33/83 2919)  Complications: No notable events documented.

## 2022-03-28 NOTE — Anesthesia Preprocedure Evaluation (Signed)
Anesthesia Evaluation  Patient identified by MRN, date of birth, ID band Patient awake    Reviewed: Allergy & Precautions, NPO status , Patient's Chart, lab work & pertinent test results  History of Anesthesia Complications (+) PONV and history of anesthetic complications  Airway Mallampati: III  TM Distance: <3 FB Neck ROM: full    Dental  (+) Chipped, Poor Dentition, Missing   Pulmonary shortness of breath and with exertion, COPD,  oxygen dependent, Current Smoker   Pulmonary exam normal        Cardiovascular Exercise Tolerance: Good hypertension, (-) angina + CAD  Normal cardiovascular exam     Neuro/Psych  PSYCHIATRIC DISORDERS       Neuromuscular disease    GI/Hepatic Neg liver ROS, hiatal hernia,GERD  Controlled,,  Endo/Other  negative endocrine ROS    Renal/GU      Musculoskeletal   Abdominal   Peds  Hematology negative hematology ROS (+)   Anesthesia Other Findings Past Medical History: No date: Anxiety     Comment:  panic attacks, chest pain No date: Arthritis No date: COPD (chronic obstructive pulmonary disease) (HCC) No date: COVID     Comment:  2023 No date: Dyspnea No date: GERD (gastroesophageal reflux disease) No date: History of hiatal hernia No date: Hypertension     Comment:  not medicated No date: Non-obstructive CAD in native artery     Comment:  a. cardiac cath 01/2014: showed minor irregularities,               mildly elevated left ventricular end-diastolic pressure               and normal ejection fraction; b. 03/2015 MV: low risk. No date: Palpitations     Comment:  a. 08/2016 Event Monitor: occas PVC's, two brief runs of               SVT (4 and 7 beats). No date: PONV (postoperative nausea and vomiting) No date: Symptomatic PVC's (premature ventricular contractions) No date: Tobacco abuse No date: Wears dentures     Comment:  partial upper and lower  Past Surgical  History: 01/03/14: CARDIAC CATHETERIZATION No date: CARDIAC CATHETERIZATION No date: CHOLECYSTECTOMY 10/05/2019: COLONOSCOPY WITH PROPOFOL; N/A     Comment:  Procedure: COLONOSCOPY WITH PROPOFOL;  Surgeon: Lucilla Lame, MD;  Location: [facility] ENDOSCOPY;  Service:               Endoscopy;  Laterality: N/A; 07/11/2016: ESOPHAGOGASTRODUODENOSCOPY (EGD) WITH PROPOFOL; N/A     Comment:  Procedure: ESOPHAGOGASTRODUODENOSCOPY (EGD) WITH               PROPOFOL;  Surgeon: Lucilla Lame, MD;  Location: [place];  Service: Endoscopy;  Laterality: N/A; 10/05/2019: ESOPHAGOGASTRODUODENOSCOPY (EGD) WITH PROPOFOL; N/A     Comment:  Procedure: ESOPHAGOGASTRODUODENOSCOPY (EGD) WITH               PROPOFOL;  Surgeon: Lucilla Lame, MD;  Location: [facility]               ENDOSCOPY;  Service: Endoscopy;  Laterality: N/A; 02/21/2022: ESOPHAGOGASTRODUODENOSCOPY (EGD) WITH PROPOFOL; N/A     Comment:  Procedure: ESOPHAGOGASTRODUODENOSCOPY (EGD) WITH               PROPOFOL;  Surgeon: Lucilla Lame, MD;  [facility]: [facility]  ENDOSCOPY;  Service: Endoscopy;  Laterality: N/A; No date: EYE SURGERY 11/30/2014: IMAGE GUIDED SINUS SURGERY; N/A     Comment:  Procedure: IMAGE GUIDED SINUS SURGERY;  Surgeon:               Carloyn Manner, MD;  Location: [place];                Service: ENT;  Laterality: N/A;  GAVE DISK TO CE CE 11/30/2014: MAXILLARY ANTROSTOMY; Bilateral     Comment:  Procedure: MAXILLARY ANTROSTOMY;  Surgeon: Carloyn Manner, MD;  Location: [place];  Service:               ENT;  Laterality: Bilateral; 11/30/2014: SEPTOPLASTY; N/A     Comment:  Procedure: SEPTOPLASTY;  Surgeon: Carloyn Manner, MD;               Location: [place];  Service: ENT;                Laterality: N/A; 11/30/2014: SPHENOIDECTOMY; Left     Comment:  Procedure: SPHENOIDECTOMY, ;  Surgeon: Carloyn Manner,              MD;  Location: [place];  Service: ENT;                Laterality: Left; 1995: VAGINAL HYSTERECTOMY     Comment:  vaginal  BMI    Body Mass Index: 28.12 kg/m      Reproductive/Obstetrics negative OB ROS                             Anesthesia Physical Anesthesia Plan  ASA: 3  Anesthesia Plan: General ETT   Post-op Pain Management:    Induction: Intravenous  PONV Risk Score and Plan: Ondansetron, Dexamethasone, Midazolam, Treatment may vary due to age or medical condition, Propofol infusion and TIVA  Airway Management Planned: Oral ETT and Video Laryngoscope Planned  Additional Equipment:   Intra-op Plan:   Post-operative Plan: Extubation in OR and Possible Post-op intubation/ventilation  Informed Consent: I have reviewed the patients History and Physical, chart, labs and discussed the procedure including the risks, benefits and alternatives for the proposed anesthesia with the patient or authorized representative who has indicated his/her understanding and acceptance.     Dental Advisory Given  Plan Discussed with: Anesthesiologist, CRNA and Surgeon  Anesthesia Plan Comments: (Patient consented for risks of anesthesia including but not limited to:  - adverse reactions to medications - damage to eyes, teeth, lips or other oral mucosa - nerve damage due to positioning  - sore throat or hoarseness - Damage to heart, brain, nerves, lungs, other parts of body or loss of life  Patient voiced understanding.)       Anesthesia Quick Evaluation

## 2022-03-28 NOTE — Op Note (Signed)
Robotic assisted laparoscopic repair of  paraesophageal  hernia with Bio-A Mesh and Nissen fundoplication  Pre-operative Diagnosis: GERD, hiatal hernia  Post-operative Diagnosis: same  Procedure:  Robotic assisted laparoscopic repair of  paraesophageal  hernia with Bio-A Mesh and Nissen fundoplication  Surgeon: Caroleen Hamman, MD FACS  Assistant: . Required due to the complexity of the case the need for exposure and lack of first assist.  Anesthesia: Gen. with endotracheal tube  Findings: Type I paraesophageal hernia w fundus within mediastinum Loose wrap 360 degree over 50 FR Bougie  Estimated Blood Loss: 02DX         Complications: none   Procedure Details  The patient was seen again in the Holding Room. The benefits, complications, treatment options, and expected outcomes were discussed with the patient. The risks of bleeding, infection, recurrence of symptoms, failure to resolve symptoms,  esophageal damage, Dysphagia, bowel injury, any of which could require further surgery were reviewed with the patient. The likelihood of improving the patient's symptoms with return to their baseline status is good.  The patient and/or family concurred with the proposed plan, giving informed consent.  The patient was taken to Operating Room, identified  and the procedure verified.  A Time Out was held and the above information confirmed.  Prior to the induction of general anesthesia, antibiotic prophylaxis was administered. VTE prophylaxis was in place. General endotracheal anesthesia was then administered and tolerated well. After the induction, the abdomen was prepped with Chloraprep and draped in the sterile fashion. The patient was positioned in the supine position.  Cut down technique was used to enter the abdominal cavity and a Hasson trochar was placed after two vicryl stitches were anchored to the fascia. Pneumoperitoneum was then created with CO2 and tolerated well without any adverse  changes in the patient's vital signs.  Three 8-mm ports were placed under direct vision. All skin incisions  were infiltrated with a local anesthetic agent before making the incision and placing the trocars. An additional 5 mm regular laparoscopic port was placed to assist with retraction and exposure.   The patient was positioned  in reverse Trendelenburg, robot was brought to the surgical field and docked in the standard fashion.  We made sure all the instrumentation was kept indirect view at all times and that there were no collision between the arms. I scrubbed out and went to the console.  I used a robotic arm to retract the liver, the vessel sealer on my right hand and a forced bipolar grasper on my left hand.  There is along the extra 5 mm port allow me ample exposure and the ability to perform meticulous dissection  We Started dividing the lesser omentum via the pars flaccida.  We Were able to dissect the lesser curvature of the stomach and  dissected the fundus free from the right and left crus.  We circumferentially dissected the GE junction.  The hernia sac was also completely reduced and we were able to bring the stomach into the intra-abdominal position.  Attention then was turned to the greater curvature where the short gastrics were divided with sealer device.  We were able to identify the left crus and again were able to make sure there was a good circumferential dissection and that the hernia sac was completely excised.  We did perform a good dissection within the mediastinum to allow a complete reduction of the sac, gain esophageal length and a to completely allow an intra-abdominal fundoplication. Using two strips of Bio-A as  pledgets we approximated the crus with a 2-0V lock suture. A bio-A 10x7 cm mesh was inserted and secured using Vistaseal.   We Asked anesthesia to place a 50 French bougie and this went easily.  We also observe trajectory of the bougie. 360 degree Nissen  fundoplication was created with multiple 2-0 silk sutures and we placed 3 stitches taking some of the esophagus within that bite.  The fundoplication measured approximately 3-1/2 cm and he was floppy. I was very happy with the way the fundoplication laid and the repair of the hernia.  Inspection of the  upper quadrant was performed. No bleeding, bile  Or esophageal injuries leaks, or bowel injuries were noted. Robotic instruments and robotic arms were undocked in the standard fashion. All the needles were removed under direct visualization.   I scrubbed back in.  Pneumoperitoneum was released.  The periumbilical port site was closed with interrumpted 0 Vicryl sutures. 4-0 subcuticular Monocryl was used to close the skin. Liposomal marcaine was injected to all the incisions sites.  Dermabond was  applied.  The patient was then extubated and brought to the recovery room in stable condition. Sponge, lap, and needle counts were correct at closure and at the conclusion of the case.               Caroleen Hamman, MD, FACS

## 2022-03-28 NOTE — Anesthesia Postprocedure Evaluation (Signed)
Anesthesia Post Note  Patient: KEIRA BOHLIN  Procedure(s) Performed: XI ROBOTIC ASSISTED PARAESOPHAGEAL HERNIA REPAIR, RNFA to assist INSERTION OF MESH  Patient location during evaluation: PACU Anesthesia Type: General Level of consciousness: awake and alert Pain management: pain level controlled Vital Signs Assessment: post-procedure vital signs reviewed and stable Respiratory status: spontaneous breathing, nonlabored ventilation, respiratory function stable and patient connected to nasal cannula oxygen Cardiovascular status: blood pressure returned to baseline and stable Postop Assessment: no apparent nausea or vomiting Anesthetic complications: no   No notable events documented.   Last Vitals:  Vitals:   03/28/22 1400 03/28/22 1415  BP: 127/78 119/72  Pulse: 84 86  Resp: (!) 24 14  Temp: (!) 36.3 C   SpO2: 100% 98%    Last Pain:  Vitals:   03/28/22 1415  TempSrc:   PainSc: 4                  Precious Haws Jameil Whitmoyer

## 2022-03-28 NOTE — Anesthesia Procedure Notes (Signed)
Procedure Name: Intubation Date/Time: 03/28/2022 10:55 AM  Performed by: Natasha Mead, CRNAPre-anesthesia Checklist: Patient identified, Emergency Drugs available, Suction available and Patient being monitored Patient Re-evaluated:Patient Re-evaluated prior to induction Oxygen Delivery Method: Circle system utilized Preoxygenation: Pre-oxygenation with 100% oxygen Induction Type: IV induction Ventilation: Mask ventilation without difficulty Laryngoscope Size: McGraph and 3 Grade View: Grade I Tube type: Oral Tube size: 6.5 mm Number of attempts: 1 Airway Equipment and Method: Stylet and Oral airway Placement Confirmation: ETT inserted through vocal cords under direct vision, positive ETCO2 and breath sounds checked- equal and bilateral Secured at: 19 cm Tube secured with: Tape Dental Injury: Teeth and Oropharynx as per pre-operative assessment

## 2022-03-29 ENCOUNTER — Encounter: Payer: Self-pay | Admitting: Surgery

## 2022-03-29 DIAGNOSIS — K219 Gastro-esophageal reflux disease without esophagitis: Secondary | ICD-10-CM | POA: Diagnosis not present

## 2022-03-29 DIAGNOSIS — F17210 Nicotine dependence, cigarettes, uncomplicated: Secondary | ICD-10-CM | POA: Diagnosis not present

## 2022-03-29 DIAGNOSIS — J449 Chronic obstructive pulmonary disease, unspecified: Secondary | ICD-10-CM | POA: Diagnosis not present

## 2022-03-29 DIAGNOSIS — K449 Diaphragmatic hernia without obstruction or gangrene: Secondary | ICD-10-CM | POA: Diagnosis not present

## 2022-03-29 DIAGNOSIS — I2510 Atherosclerotic heart disease of native coronary artery without angina pectoris: Secondary | ICD-10-CM | POA: Diagnosis not present

## 2022-03-29 DIAGNOSIS — I10 Essential (primary) hypertension: Secondary | ICD-10-CM | POA: Diagnosis not present

## 2022-03-29 LAB — BASIC METABOLIC PANEL
Anion gap: 5 (ref 5–15)
BUN: 11 mg/dL (ref 6–20)
CO2: 29 mmol/L (ref 22–32)
Calcium: 8.8 mg/dL — ABNORMAL LOW (ref 8.9–10.3)
Chloride: 106 mmol/L (ref 98–111)
Creatinine, Ser: 0.66 mg/dL (ref 0.44–1.00)
GFR, Estimated: >60 mL/min (ref >60)
Glucose, Bld: 132 mg/dL — ABNORMAL HIGH (ref 70–99)
Potassium: 4.1 mmol/L (ref 3.5–5.1)
Sodium: 140 mmol/L (ref 135–145)

## 2022-03-29 LAB — CBC
HCT: 38.7 % (ref 36.0–46.0)
Hemoglobin: 12.6 g/dL (ref 12.0–15.0)
MCH: 26.3 pg (ref 26.0–34.0)
MCHC: 32.6 g/dL (ref 30.0–36.0)
MCV: 80.8 fL (ref 80.0–100.0)
Platelets: 239 10*3/uL (ref 150–400)
RBC: 4.79 MIL/uL (ref 3.87–5.11)
RDW: 14.6 % (ref 11.5–15.5)
WBC: 12 10*3/uL — ABNORMAL HIGH (ref 4.0–10.5)
nRBC: 0 % (ref 0.0–0.2)

## 2022-03-29 MED ORDER — OXYCODONE HCL 5 MG PO TABS: 5.0000 mg | ORAL_TABLET | Freq: Four times a day (QID) | ORAL | 0 refills | Status: DC | PRN

## 2022-03-29 MED ORDER — IBUPROFEN 600 MG PO TABS: 600.0000 mg | ORAL_TABLET | Freq: Four times a day (QID) | ORAL | 0 refills | Status: DC | PRN

## 2022-03-29 MED ORDER — METHOCARBAMOL 500 MG PO TABS: 500.0000 mg | ORAL_TABLET | Freq: Three times a day (TID) | ORAL | 0 refills | Status: DC | PRN

## 2022-03-29 NOTE — TOC CM/SW Note (Signed)
Patient has orders to discharge home today. Chart reviewed. No TOC needs identified. CSW signing off.  Dayton Scrape, Ashley

## 2022-03-29 NOTE — Discharge Instructions (Signed)
In addition to included general post-operative instructions,  Diet: Recommend following Nissen diet recommendation and restrictions for a minimum of 4 weeks. You were given a handout regarding this.    Activity: No heavy lifting >20 pounds (children, pets, laundry, garbage) or strenuous activity for 4 weeks, but light activity and walking are encouraged. Do not drive or drink alcohol if taking narcotic pain medications or having pain that might distract from driving.  Wound care: 2 days after surgery (01/27), you may shower/get incision wet with soapy water and pat dry (do not rub incisions), but no baths or submerging incision underwater until follow-up.   Medications: Resume all home medications. For mild to moderate pain: acetaminophen (Tylenol) or ibuprofen/naproxen (if no kidney disease). Combining Tylenol with alcohol can substantially increase your risk of causing liver disease. Narcotic pain medications, if prescribed, can be used for severe pain, though may cause nausea, constipation, and drowsiness. Do not combine Tylenol and Percocet (or similar) within a 6 hour period as Percocet (and similar) contain(s) Tylenol. If you do not need the narcotic pain medication, you do not need to fill the prescription.  Call office [phone] / [phone]) at any time if any questions, worsening pain, fevers/chills, bleeding, drainage from incision site, or other concerns.

## 2022-03-29 NOTE — Plan of Care (Signed)

## 2022-03-29 NOTE — Discharge Summary (Signed)
[facility] SURGICAL DISCHARGE SUMMARY  Patient ID: Andrea [name] MRN: [id number] DOB/AGE: [birth year] 57 y.o.  Admit date: 03/28/2022 Discharge date: 03/29/2022  Discharge Diagnoses Patient Active Problem List   Diagnosis Date Noted   Hiatal hernia 03/28/2022   S/P repair of paraesophageal hernia 03/28/2022    Consultants None  Procedures 03/28/2022:  Robotic assisted laparoscopic paraesophageal hernia repair and Nissen Fundoplication   HPI: Andrea [name] is a 57 y.o. female with history of recalcitrant reflux who presents to [facility] on 01/25 for scheduled robotic assisted laparoscopic paraesophageal hernia repair and Nissen Fundoplication with Dr [facility] Course: Informed consent was obtained and documented, and patient underwent uneventful robotic assisted laparoscopic paraesophageal hernia repair and Nissen Fundoplication (Dr Dahlia Byes, 01/75/1025).  Post-operatively, patient did well. Advancement of patient's diet and ambulation were well-tolerated. The remainder of patient's hospital course was essentially unremarkable, and discharge planning was initiated accordingly with patient safely able to be discharged home with appropriate discharge instructions, pain control, and outpatient follow-up after all of her and her family's questions were answered to their expressed satisfaction.   Discharge Condition: Good   Physical Examination:  Constitutional: Well appearing female, NAD Pulmonary: Normal effort, no respiratory distress Gastrointestinal: Soft, incisional soreness expectedly, non-distended, no rebound/guarding Skin: Laparoscopic incisions are CDI with dermabond, no erythema    Allergies as of 03/29/2022       Reactions   Alum & Mag Hydroxide-simeth Diarrhea, Nausea Only   Bactrim [sulfamethoxazole-trimethoprim] Itching   Cefdinir Other (See Comments)   tachycardia   Chantix [varenicline]    Bad dreams   Chlorhexidine Gluconate Swelling    Doxycycline Other (See Comments)   Nausea, migraine   Multaq [dronedarone] Other (See Comments)   Lip/ mouth numbness/ tongue swelling   Ivp Dye [iodinated Contrast Media] Palpitations        Medication List     TAKE these medications    AeroChamber MV inhaler Use as instructed   AeroChamber MV inhaler Use as instructed   albuterol (2.5 MG/3ML) 0.083% nebulizer solution Commonly known as: PROVENTIL USE 1 VIAL IN NEBULIZER EVERY 4 (FOUR) HOURS AS NEEDED FOR WHEEZING OR SHORTNESS OF BREATH.   albuterol 108 (90 Base) MCG/ACT inhaler Commonly known as: VENTOLIN HFA TAKE 2 PUFFS BY MOUTH EVERY 6 HOURS AS NEEDED FOR WHEEZE OR SHORTNESS OF BREATH   atorvastatin 20 MG tablet Commonly known as: LIPITOR Take 1 tablet (20 mg total) by mouth daily.   Breztri Aerosphere 160-9-4.8 MCG/ACT Aero Generic drug: Budeson-Glycopyrrol-Formoterol INHALE 2 PUFFS INTO THE LUNGS IN THE MORNING AND AT BEDTIME.   clonazePAM 1 MG tablet Commonly known as: KLONOPIN Take 1 tablet (1 mg total) by mouth 2 (two) times daily as needed.   Compressor/Nebulizer Misc Supplies only   Adult nurse Use as directed   dexlansoprazole 60 MG capsule Commonly known as: DEXILANT TAKE 1 CAPSULE BY MOUTH EVERY DAY   fluticasone 50 MCG/ACT nasal spray Commonly known as: FLONASE Place 2 sprays into both nostrils daily. What changed:  when to take this reasons to take this   Flutter Devi 1 Device by Does not apply route daily.   hydroxychloroquine 200 MG tablet Commonly known as: PLAQUENIL TAKE 2 TABLETS (400 MG TOTAL) BY MOUTH DAILY FOR DISCOID LUPUS (L93.0) What changed:  how much to take when to take this   ibuprofen 600 MG tablet Commonly known as: ADVIL Take 1 tablet (600 mg total) by mouth every 6 (six) hours as needed.   levalbuterol 45 MCG/ACT  inhaler Commonly known as: XOPENEX HFA Inhale 2 puffs into the lungs every 4 (four) hours as needed for wheezing.   methocarbamol  500 MG tablet Commonly known as: ROBAXIN Take 1 tablet (500 mg total) by mouth every 8 (eight) hours as needed for muscle spasms.   nitroGLYCERIN 0.4 MG SL tablet Commonly known as: NITROSTAT Place 1 tablet (0.4 mg total) under the tongue every 5 (five) minutes as needed for chest pain.   oxyCODONE 5 MG immediate release tablet Commonly known as: Oxy IR/ROXICODONE Take 1 tablet (5 mg total) by mouth every 6 (six) hours as needed for severe pain or breakthrough pain.   OXYGEN Inhale 1 L into the lungs as needed.   Pulse Oximeter Deluxe Misc Use daily to monitor oxygen levels          Follow-up Information     Pabon, [place] F, MD. Schedule an appointment as soon as possible for a visit in 3 week(s).   Specialty: General Surgery Why: Follow up in 2-3 weeks; s/p Robotic assisted laparoscopic paraesophageal hernia repair and Nissen Fundoplication Contact information: [address] [place] [address] [phone]                  Time spent on discharge management including discussion of hospital course, clinical condition, outpatient instructions, prescriptions, and follow up with the patient and members of the medical team: >30 minutes  -- Edison Simon , PA-C [facility]  03/29/2022, 9:06 AM [phone] M-F: 7am - 4pm

## 2022-03-29 NOTE — Plan of Care (Signed)

## 2022-03-30 ENCOUNTER — Other Ambulatory Visit: Payer: Self-pay | Admitting: Surgery

## 2022-03-30 MED ORDER — OXYCODONE HCL 5 MG PO TABS: 5.0000 mg | ORAL_TABLET | Freq: Four times a day (QID) | ORAL | 0 refills | Status: DC | PRN

## 2022-04-01 ENCOUNTER — Telehealth: Payer: Self-pay

## 2022-04-01 NOTE — Telephone Encounter (Signed)
Transition Care Management Follow-up Telephone Call Date of discharge and from where: [place] 03/29/2022 How have you been since you were released from the hospital? okay Any questions or concerns? No  Items Reviewed: Did the pt receive and understand the discharge instructions provided? Yes  Medications obtained and verified? Yes  Other? No  Any new allergies since your discharge? No  Dietary orders reviewed? Yes Do you have support at home? Yes   Home Care and Equipment/Supplies: Were home health services ordered? no If so, what is the name of the agency? N/a  Has the agency set up a time to come to the patient's home? not applicable Were any new equipment or medical supplies ordered?  No What is the name of the medical supply agency? N/a Were you able to get the supplies/equipment? not applicable Do you have any questions related to the use of the equipment or supplies? No  Functional Questionnaire: (I = Independent and D = Dependent) ADLs: I  Bathing/Dressing- I  Meal Prep- I  Eating- I  Maintaining continence- I  Transferring/Ambulation- I  Managing Meds- I  Follow up appointments reviewed:  PCP Hospital f/u appt confirmed? No   Specialist Hospital f/u appt confirmed? Yes  Scheduled to see Dr Dahlia Byes on 04/15/2022 @ 9:30. Are transportation arrangements needed? No  If their condition worsens, is the pt aware to call PCP or go to the Emergency Dept.? Yes Was the patient provided with contact information for the PCP's office or ED? Yes Was to pt encouraged to call back with questions or concerns? Yes  Andrea Crumble, LPN [place] [facility] [phone]

## 2022-04-04 ENCOUNTER — Encounter: Payer: Self-pay | Admitting: *Deleted

## 2022-04-08 ENCOUNTER — Telehealth: Payer: Self-pay

## 2022-04-08 NOTE — Telephone Encounter (Signed)
Patient had a paraesophageal hernia repair on 03/28/22. She reports that she has been able to drink with out problems except for very cold liquids. When she is eating or drinking very cold liquids she reports that she is having cramping and discomfort of her lower esophagus/stomach area. She is concerned about this as she is now afraid to eat. Patient advised to drink protein supplements at a more room temperature and that we would consult with Dr Dahlia Byes about this.

## 2022-04-09 MED ORDER — CYCLOBENZAPRINE HCL 5 MG PO TABS: 5.0000 mg | ORAL_TABLET | Freq: Three times a day (TID) | ORAL | 0 refills | Status: DC | PRN

## 2022-04-09 NOTE — Telephone Encounter (Signed)
Spoke with patient and let her know that Dr Dahlia Byes said that is normal and should not last long. We will send in a prescription for Flexeril to help with this. She is aware to call back if the symptoms continue or worsen.

## 2022-04-15 ENCOUNTER — Encounter: Payer: Self-pay | Admitting: Surgery

## 2022-04-15 ENCOUNTER — Ambulatory Visit (INDEPENDENT_AMBULATORY_CARE_PROVIDER_SITE_OTHER): Payer: 59 | Admitting: Surgery

## 2022-04-15 ENCOUNTER — Telehealth: Payer: 59 | Admitting: Physician Assistant

## 2022-04-15 VITALS — BP 115/75 | HR 92 | Temp 98.3°F | Ht 63.0 in | Wt 155.0 lb

## 2022-04-15 DIAGNOSIS — Z09 Encounter for follow-up examination after completed treatment for conditions other than malignant neoplasm: Secondary | ICD-10-CM

## 2022-04-15 DIAGNOSIS — K449 Diaphragmatic hernia without obstruction or gangrene: Secondary | ICD-10-CM

## 2022-04-15 DIAGNOSIS — J441 Chronic obstructive pulmonary disease with (acute) exacerbation: Secondary | ICD-10-CM | POA: Diagnosis not present

## 2022-04-15 DIAGNOSIS — J449 Chronic obstructive pulmonary disease, unspecified: Secondary | ICD-10-CM | POA: Diagnosis not present

## 2022-04-15 MED ORDER — BENZONATATE 100 MG PO CAPS: 100.0000 mg | ORAL_CAPSULE | Freq: Three times a day (TID) | ORAL | 0 refills | Status: DC | PRN

## 2022-04-15 MED ORDER — AZITHROMYCIN 250 MG PO TABS: ORAL_TABLET | ORAL | 0 refills | Status: AC

## 2022-04-15 MED ORDER — PREDNISONE 20 MG PO TABS: 40.0000 mg | ORAL_TABLET | Freq: Every day | ORAL | 0 refills | Status: DC

## 2022-04-15 NOTE — Progress Notes (Signed)
We are sorry that you are not feeling well.  Here is how we plan to help!  Based on your presentation I believe you most likely have A cough due to bacteria.  When patients have a fever and a productive cough with a change in color or increased sputum production, we are concerned about bacterial bronchitis.  If left untreated it can progress to pneumonia.  If your symptoms do not improve with your treatment plan it is important that you contact your provider.   I have prescribed Azithromyin 250 mg: two tablets now and then one tablet daily for 4 additonal days    In addition you may use A prescription cough medication called Tessalon Perles 165m. You may take 1-2 capsules every 8 hours as needed for your cough.  Prednisone 274mTake 2 tablets (4026mdaily for 7 days  From your responses in the eVisit questionnaire you describe inflammation in the upper respiratory tract which is causing a significant cough.  This is commonly called Bronchitis and has four common causes:   Allergies Viral Infections Acid Reflux Bacterial Infection Allergies, viruses and acid reflux are treated by controlling symptoms or eliminating the cause. An example might be a cough caused by taking certain blood pressure medications. You stop the cough by changing the medication. Another example might be a cough caused by acid reflux. Controlling the reflux helps control the cough.  USE OF BRONCHODILATOR ("RESCUE") INHALERS: There is a risk from using your bronchodilator too frequently.  The risk is that over-reliance on a medication which only relaxes the muscles surrounding the breathing tubes can reduce the effectiveness of medications prescribed to reduce swelling and congestion of the tubes themselves.  Although you feel brief relief from the bronchodilator inhaler, your asthma may actually be worsening with the tubes becoming more swollen and filled with mucus.  This can delay other crucial treatments, such as oral steroid  medications. If you need to use a bronchodilator inhaler daily, several times per day, you should discuss this with your provider.  There are probably better treatments that could be used to keep your asthma under control.     HOME CARE Only take medications as instructed by your medical team. Complete the entire course of an antibiotic. Drink plenty of fluids and get plenty of rest. Avoid close contacts especially the very young and the elderly Cover your mouth if you cough or cough into your sleeve. Always remember to wash your hands A steam or ultrasonic humidifier can help congestion.   GET HELP RIGHT AWAY IF: You develop worsening fever. You become short of breath You cough up blood. Your symptoms persist after you have completed your treatment plan MAKE SURE YOU  Understand these instructions. Will watch your condition. Will get help right away if you are not doing well or get worse.    Thank you for choosing an e-visit.  Your e-visit answers were reviewed by a board certified advanced clinical practitioner to complete your personal care plan. Depending upon the condition, your plan could have included both over the counter or prescription medications.  Please review your pharmacy choice. Make sure the pharmacy is open so you can pick up prescription now. If there is a problem, you may contact your provider through [employer] have the prescription routed to another pharmacy.  Your safety is important to us.[place] you have drug allergies check your prescription carefully.   For the next 24 hours you can use MyChart to ask questions  about today's visit, request a non-urgent call back, or ask for a work or school excuse. You will get an email in the next two days asking about your experience. I hope that your e-visit has been valuable and will speed your recovery.  I have spent 5 minutes in review of e-visit questionnaire, review and updating patient chart, medical decision  making and response to patient.   Mar Daring, PA-C

## 2022-04-15 NOTE — Progress Notes (Signed)
Desarey is a 57 year old female 2 and half weeks following Nissen fundoplication. He is doing well.  She developed some esophageal spasm after cold water.  This is getting better.  Is that her reflux is significantly better as well as her cough. Continues to smoke actually has increased to smoking for the last couple weeks. No Fevers no chills  PE: NAD Abd: Soft incisions healing well without infection.  No tenderness no peritonitis  A/P doing well after hiatal hernia repair with Nissen fundoplication. Continue soft diet.  May advance slowly. I will See her back in about a month or so. Encouraged her about smoking cessation again

## 2022-04-15 NOTE — Patient Instructions (Signed)
Start expanding your diet in about 2 weeks. Try to eat foods high in protein or protein shakes.    Follow up here in 1 month.

## 2022-04-17 ENCOUNTER — Other Ambulatory Visit: Payer: 59 | Admitting: Family Medicine

## 2022-04-24 DIAGNOSIS — J9621 Acute and chronic respiratory failure with hypoxia: Secondary | ICD-10-CM | POA: Diagnosis not present

## 2022-04-25 ENCOUNTER — Telehealth (INDEPENDENT_AMBULATORY_CARE_PROVIDER_SITE_OTHER): Payer: 59 | Admitting: Family Medicine

## 2022-04-25 ENCOUNTER — Encounter: Payer: Self-pay | Admitting: Family Medicine

## 2022-04-25 DIAGNOSIS — J441 Chronic obstructive pulmonary disease with (acute) exacerbation: Secondary | ICD-10-CM | POA: Diagnosis not present

## 2022-04-25 MED ORDER — LEVOFLOXACIN 500 MG PO TABS: 500.0000 mg | ORAL_TABLET | Freq: Every day | ORAL | 0 refills | Status: DC

## 2022-04-25 MED ORDER — MONTELUKAST SODIUM 10 MG PO TABS: 10.0000 mg | ORAL_TABLET | Freq: Every day | ORAL | 3 refills | Status: DC

## 2022-04-25 MED ORDER — METHYLPREDNISOLONE 4 MG PO TBPK: ORAL_TABLET | ORAL | 0 refills | Status: DC

## 2022-04-25 MED ORDER — OXYMETAZOLINE HCL 0.05 % NA SOLN: NASAL | 0 refills | Status: DC

## 2022-04-25 NOTE — Progress Notes (Signed)
I,Connie R Striblin,acting as a Education administrator for [name], FNP.,have documented all relevant documentation on the behalf of [name], FNP,as directed by  [name], FNP while in the presence of [name], FNP.  MyChart Video Visit  Virtual Visit via Video Note   This format is felt to be most appropriate for this patient at this time. Physical exam was limited by quality of the video and audio technology used for the visit.   Patient location: Home Provider location: [facility]  I discussed the limitations of evaluation and management by telemedicine and the availability of in person appointments. The patient expressed understanding and agreed to proceed.  Patient: Andrea [name]   DOB: 1965-10-09   57 y.o. Female  MRN: [id number] Visit Date: 04/25/2022  Today's healthcare provider: [name], FNP   Introduced to nurse practitioner role and practice setting.  All questions answered.  Discussed provider/patient relationship and expectations.  Subjective    HPI  Upper respiratory symptoms She complains of nose bleeds, nasal drainage, congestion, sneezing, and facial swelling .with no fever, chills, night sweats or weight loss. Onset of symptoms was yesterday and worsening.She is drinking plenty of fluids.  Past history is significant for no history of pneumonia or bronchitis.   ---------------------------------------------------------------------------------------------------  Medications: Outpatient Medications Prior to Visit  Medication Sig   albuterol (PROVENTIL) (2.5 MG/3ML) 0.083% nebulizer solution USE 1 VIAL IN NEBULIZER EVERY 4 (FOUR) HOURS AS NEEDED FOR WHEEZING OR SHORTNESS OF BREATH.   albuterol (VENTOLIN HFA) 108 (90 Base) MCG/ACT inhaler TAKE 2 PUFFS BY MOUTH EVERY 6 HOURS AS NEEDED FOR WHEEZE OR SHORTNESS OF BREATH   atorvastatin (LIPITOR) 20 MG tablet Take 1 tablet (20 mg total) by mouth daily.   benzonatate (TESSALON) 100 MG capsule  Take 1 capsule (100 mg total) by mouth 3 (three) times daily as needed.   BREZTRI AEROSPHERE 160-9-4.8 MCG/ACT AERO INHALE 2 PUFFS INTO THE LUNGS IN THE MORNING AND AT BEDTIME.   clonazePAM (KLONOPIN) 1 MG tablet Take 1 tablet (1 mg total) by mouth 2 (two) times daily as needed.   cyclobenzaprine (FLEXERIL) 5 MG tablet Take 1 tablet (5 mg total) by mouth 3 (three) times daily as needed for muscle spasms.   dexlansoprazole (DEXILANT) 60 MG capsule TAKE 1 CAPSULE BY MOUTH EVERY DAY   fluticasone (FLONASE) 50 MCG/ACT nasal spray Place 2 sprays into both nostrils daily. (Patient taking differently: Place 2 sprays into both nostrils as needed.)   hydroxychloroquine (PLAQUENIL) 200 MG tablet TAKE 2 TABLETS (400 MG TOTAL) BY MOUTH DAILY FOR DISCOID LUPUS (L93.0) (Patient taking differently: Take 200 mg by mouth 2 (two) times daily.)   ibuprofen (ADVIL) 600 MG tablet Take 1 tablet (600 mg total) by mouth every 6 (six) hours as needed.   levalbuterol (XOPENEX HFA) 45 MCG/ACT inhaler Inhale 2 puffs into the lungs every 4 (four) hours as needed for wheezing.   methocarbamol (ROBAXIN) 500 MG tablet Take 1 tablet (500 mg total) by mouth every 8 (eight) hours as needed for muscle spasms.   Misc. Devices (PULSE OXIMETER DELUXE) MISC Use daily to monitor oxygen levels   Nebulizers (COMPRESSOR/NEBULIZER) MISC Supplies only   Nebulizers (COMPRESSOR/NEBULIZER) MISC Use as directed   nitroGLYCERIN (NITROSTAT) 0.4 MG SL tablet Place 1 tablet (0.4 mg total) under the tongue every 5 (five) minutes as needed for chest pain.   oxyCODONE (OXY IR/ROXICODONE) 5 MG immediate release tablet Take 1 tablet (5 mg total) by mouth  every 6 (six) hours as needed for severe pain or breakthrough pain.   OXYGEN Inhale 1 L into the lungs as needed.   Respiratory Therapy Supplies (FLUTTER) DEVI 1 Device by Does not apply route daily.   Spacer/Aero-Holding Chambers (AEROCHAMBER MV) inhaler Use as instructed   Spacer/Aero-Holding Chambers  (AEROCHAMBER MV) inhaler Use as instructed   [DISCONTINUED] predniSONE (DELTASONE) 20 MG tablet Take 2 tablets (40 mg total) by mouth daily with breakfast.   No facility-administered medications prior to visit.    Review of Systems     Objective    There were no vitals taken for this visit.     Physical Exam Constitutional:      General: She is not in acute distress.    Appearance: Normal appearance. She is not ill-appearing or toxic-appearing.  HENT:     Head: Normocephalic and atraumatic.     Nose: Congestion and rhinorrhea present.  Eyes:     Conjunctiva/sclera: Conjunctivae normal.     Pupils: Pupils are equal, round, and reactive to light.  Pulmonary:     Effort: Pulmonary effort is normal.  Skin:    General: Skin is dry.  Neurological:     General: No focal deficit present.     Mental Status: She is alert and oriented to person, place, and time. Mental status is at baseline.  Psychiatric:        Mood and Affect: Mood normal.        Behavior: Behavior normal.        Thought Content: Thought content normal.        Judgment: Judgment normal.        Assessment & Plan     Problem List Items Addressed This Visit       Respiratory   COPD with acute exacerbation ([place]) - Primary    Recently treated; however, symptoms have returned Recommend change in abx with additional steroids to assist Continue all inhalers; continue to recommend smoking cessation Continue antihistamine and intranasal steroids RTC if needed given 2 virtual visits Denies sick contacts or travel      Relevant Medications   methylPREDNISolone (MEDROL DOSEPAK) 4 MG TBPK tablet   levofloxacin (LEVAQUIN) 500 MG tablet   montelukast (SINGULAIR) 10 MG tablet   oxymetazoline (AFRIN NASAL SPRAY) 0.05 % nasal spray    Return if symptoms worsen or fail to improve.     I discussed the assessment and treatment plan with the patient. The patient was provided an opportunity to ask questions and  all were answered. The patient agreed with the plan and demonstrated an understanding of the instructions.   The patient was advised to call back or seek an in-person evaluation if the symptoms worsen or if the condition fails to improve as anticipated.  I provided 10 minutes of face-to-face time during this encounter discussing current and previous symptoms of sinus and lung complaints due to drainage.  Vonna Kotyk, FNP, have reviewed all documentation for this visit. The documentation on 04/25/22 for the exam, diagnosis, procedures, and orders are all accurate and complete.   [name], [place] [phone] (phone) [phone] (fax)  [place]

## 2022-04-25 NOTE — Assessment & Plan Note (Signed)
Recently treated; however, symptoms have returned Recommend change in abx with additional steroids to assist Continue all inhalers; continue to recommend smoking cessation Continue antihistamine and intranasal steroids RTC if needed given 2 virtual visits Denies sick contacts or travel

## 2022-04-25 NOTE — Assessment & Plan Note (Signed)
>>  ASSESSMENT AND PLAN FOR COPD WITH ACUTE EXACERBATION ([place]) WRITTEN ON 04/25/2022  3:48 PM BY PAYNE, ELISE T, FNP  Recently treated; however, symptoms have returned Recommend change in abx with additional steroids to assist Continue all inhalers; continue to recommend smoking cessation Continue antihistamine and intranasal steroids RTC if needed given 2 virtual visits Denies sick contacts or travel

## 2022-04-25 NOTE — Assessment & Plan Note (Signed)
>>  ASSESSMENT AND PLAN FOR COPD (CHRONIC OBSTRUCTIVE PULMONARY DISEASE) (HCC) WRITTEN ON 05/15/2022 10:05 AM BY [name], MD  >>ASSESSMENT AND PLAN FOR COPD WITH ACUTE EXACERBATION ([facility]) WRITTEN ON 04/25/2022  3:48 PM BY PAYNE, ELISE T, FNP  Recently treated; however, symptoms have returned Recommend change in abx with additional steroids to assist Continue all inhalers; continue to recommend smoking cessation Continue antihistamine and intranasal steroids RTC if needed given 2 virtual visits Denies sick contacts or travel

## 2022-05-13 ENCOUNTER — Telehealth: Payer: Self-pay | Admitting: Family Medicine

## 2022-05-13 NOTE — Telephone Encounter (Signed)
Needs office visit.

## 2022-05-13 NOTE — Telephone Encounter (Signed)
Pt stated been feeling fatigue after  6 weeks surgery requesting labs for B12, potasium, and Iron. Please advise

## 2022-05-13 NOTE — Telephone Encounter (Signed)
Pt is calling to request an order for a B-12 , Iron, and potassium. Pt had surgery 6 weeks ago. Please advise CB- [phone]

## 2022-05-14 NOTE — Telephone Encounter (Signed)
Appointment made

## 2022-05-15 ENCOUNTER — Ambulatory Visit (INDEPENDENT_AMBULATORY_CARE_PROVIDER_SITE_OTHER): Payer: 59 | Admitting: Surgery

## 2022-05-15 ENCOUNTER — Ambulatory Visit (INDEPENDENT_AMBULATORY_CARE_PROVIDER_SITE_OTHER): Payer: 59 | Admitting: Family Medicine

## 2022-05-15 ENCOUNTER — Encounter: Payer: Self-pay | Admitting: Surgery

## 2022-05-15 ENCOUNTER — Encounter: Payer: Self-pay | Admitting: Family Medicine

## 2022-05-15 VITALS — BP 121/74 | HR 87 | Temp 98.0°F | Ht 63.0 in | Wt 149.0 lb

## 2022-05-15 VITALS — BP 112/61 | HR 75 | Wt 149.3 lb

## 2022-05-15 DIAGNOSIS — D539 Nutritional anemia, unspecified: Secondary | ICD-10-CM | POA: Diagnosis not present

## 2022-05-15 DIAGNOSIS — D509 Iron deficiency anemia, unspecified: Secondary | ICD-10-CM

## 2022-05-15 DIAGNOSIS — R5383 Other fatigue: Secondary | ICD-10-CM

## 2022-05-15 DIAGNOSIS — Z09 Encounter for follow-up examination after completed treatment for conditions other than malignant neoplasm: Secondary | ICD-10-CM

## 2022-05-15 DIAGNOSIS — K449 Diaphragmatic hernia without obstruction or gangrene: Secondary | ICD-10-CM

## 2022-05-15 DIAGNOSIS — R400 Somnolence: Secondary | ICD-10-CM | POA: Diagnosis not present

## 2022-05-15 DIAGNOSIS — M329 Systemic lupus erythematosus, unspecified: Secondary | ICD-10-CM

## 2022-05-15 MED ORDER — ONDANSETRON HCL 4 MG PO TABS: 4.0000 mg | ORAL_TABLET | Freq: Three times a day (TID) | ORAL | 1 refills | Status: DC | PRN

## 2022-05-15 NOTE — Progress Notes (Signed)
I,Sha'taria Tyson,acting as a Education administrator for Lelon Huh, MD.,have documented all relevant documentation on the behalf of Lelon Huh, MD,as directed by  Lelon Huh, MD while in the presence of Lelon Huh, MD.   Established patient visit   Patient: Andrea [name]   DOB: 1965/07/23   57 y.o. Female  MRN: [id number] Visit Date: 05/15/2022  Today's healthcare provider: Lelon Huh, MD   Chief Complaint  Patient presents with   Sleepiness   Subjective    HPI  Fatigue-Patient is a 57 year old female who presents requesting labs be done to check on B12, Iron and Potassium due to fatigue. Patient reports feeling like this for almost a month and finds herself falling a sleep wherever she is if she sit stills long enough. She has no trouble falling or staying asleep at night. Does not wake during the night, but feels very sleepy even after a full nights sleep.  She is noted to have had paraesophageal hernia repair in January has been on mostly soft food diet since then.However the the sleepiness just started a couple of weeks ago.   Medications: Outpatient Medications Prior to Visit  Medication Sig   albuterol (PROVENTIL) (2.5 MG/3ML) 0.083% nebulizer solution USE 1 VIAL IN NEBULIZER EVERY 4 (FOUR) HOURS AS NEEDED FOR WHEEZING OR SHORTNESS OF BREATH.   albuterol (VENTOLIN HFA) 108 (90 Base) MCG/ACT inhaler TAKE 2 PUFFS BY MOUTH EVERY 6 HOURS AS NEEDED FOR WHEEZE OR SHORTNESS OF BREATH   atorvastatin (LIPITOR) 20 MG tablet Take 1 tablet (20 mg total) by mouth daily.   benzonatate (TESSALON) 100 MG capsule Take 1 capsule (100 mg total) by mouth 3 (three) times daily as needed.   BREZTRI AEROSPHERE 160-9-4.8 MCG/ACT AERO INHALE 2 PUFFS INTO THE LUNGS IN THE MORNING AND AT BEDTIME.   clonazePAM (KLONOPIN) 1 MG tablet Take 1 tablet (1 mg total) by mouth 2 (two) times daily as needed.   cyclobenzaprine (FLEXERIL) 5 MG tablet Take 1 tablet (5 mg total) by mouth 3 (three) times daily as  needed for muscle spasms.   dexlansoprazole (DEXILANT) 60 MG capsule TAKE 1 CAPSULE BY MOUTH EVERY DAY   fluticasone (FLONASE) 50 MCG/ACT nasal spray Place 2 sprays into both nostrils daily. (Patient taking differently: Place 2 sprays into both nostrils as needed.)   hydroxychloroquine (PLAQUENIL) 200 MG tablet TAKE 2 TABLETS (400 MG TOTAL) BY MOUTH DAILY FOR DISCOID LUPUS (L93.0) (Patient taking differently: Take 200 mg by mouth 2 (two) times daily.)   ibuprofen (ADVIL) 600 MG tablet Take 1 tablet (600 mg total) by mouth every 6 (six) hours as needed.   levalbuterol (XOPENEX HFA) 45 MCG/ACT inhaler Inhale 2 puffs into the lungs every 4 (four) hours as needed for wheezing.   methocarbamol (ROBAXIN) 500 MG tablet Take 1 tablet (500 mg total) by mouth every 8 (eight) hours as needed for muscle spasms.   methylPREDNISolone (MEDROL DOSEPAK) 4 MG TBPK tablet Take as directed on pkg   Misc. Devices (PULSE OXIMETER DELUXE) MISC Use daily to monitor oxygen levels   montelukast (SINGULAIR) 10 MG tablet Take 1 tablet (10 mg total) by mouth at bedtime.   Nebulizers (COMPRESSOR/NEBULIZER) MISC Supplies only   Nebulizers (COMPRESSOR/NEBULIZER) MISC Use as directed   nitroGLYCERIN (NITROSTAT) 0.4 MG SL tablet Place 1 tablet (0.4 mg total) under the tongue every 5 (five) minutes as needed for chest pain.   oxyCODONE (OXY IR/ROXICODONE) 5 MG immediate release tablet Take 1 tablet (5 mg total) by  mouth every 6 (six) hours as needed for severe pain or breakthrough pain.   OXYGEN Inhale 1 L into the lungs as needed.   oxymetazoline (AFRIN NASAL SPRAY) 0.05 % nasal spray 2 sprays per nare for acute nasal bleeding/epistaxis   Respiratory Therapy Supplies (FLUTTER) DEVI 1 Device by Does not apply route daily.   Spacer/Aero-Holding Chambers (AEROCHAMBER MV) inhaler Use as instructed   Spacer/Aero-Holding Chambers (AEROCHAMBER MV) inhaler Use as instructed   No facility-administered medications prior to visit.     Review of Systems  Constitutional:  Negative for appetite change, chills and fever.  Respiratory:  Negative for chest tightness and shortness of breath.   Cardiovascular:  Negative for chest pain and palpitations.  Gastrointestinal:  Negative for abdominal pain, nausea and vomiting.  Neurological:  Negative for dizziness and weakness.       Objective    BP 112/61 (BP Location: Left Arm, Patient Position: Sitting, Cuff Size: Normal)   Pulse 75   Wt 149 lb 4.8 oz (67.7 kg)   SpO2 100%   BMI 26.45 kg/m    Physical Exam   General Appearance:    Well developed, well nourished female, alert, cooperative, in no acute distress  HENT:   ENT exam normal, no neck nodes or sinus tenderness  Eyes:    PERRL, conjunctiva/corneas clear, EOM's intact       Lungs:     Clear to auscultation bilaterally, respirations unlabored  Heart:    Normal heart rate. Normal rhythm. No murmurs, rubs, or gallops.    Neurologic:   Awake, alert, oriented x 3. No apparent focal neurological           defect.        Assessment & Plan     1. Other fatigue  - Comprehensive metabolic panel  2. Daytime somnolence Home MPSG  3. Lupus (HCC) Stable on DMA  4. Iron deficiency anemia, unspecified iron deficiency anemia type  - CBC - Iron, TIBC and Ferritin Panel  5. Nutritional anemia  - Vitamin B12      The entirety of the information documented in the History of Present Illness, Review of Systems and Physical Exam were personally obtained by me. Portions of this information were initially documented by the CMA and reviewed by me for thoroughness and accuracy.     Lelon Huh, MD  [place] [phone] (phone) [phone] (fax)  [place]

## 2022-05-15 NOTE — Patient Instructions (Signed)
We will send you in a prescription for Zofran for nausea. Be sure to wait to eat 20-30 minutes after you take this so it is in full effect.   You may eat chocolate, just in small amounts.   Follow up here in 3 months.

## 2022-05-16 LAB — CBC
Hematocrit: 42.1 % (ref 34.0–46.6)
Hemoglobin: 13.5 g/dL (ref 11.1–15.9)
MCH: 25.7 pg — ABNORMAL LOW (ref 26.6–33.0)
MCHC: 32.1 g/dL (ref 31.5–35.7)
MCV: 80 fL (ref 79–97)
Platelets: 379 10*3/uL (ref 150–450)
RBC: 5.25 x10E6/uL (ref 3.77–5.28)
RDW: 14.5 % (ref 11.7–15.4)
WBC: 6.2 10*3/uL (ref 3.4–10.8)

## 2022-05-16 LAB — COMPREHENSIVE METABOLIC PANEL
ALT: 12 IU/L (ref 0–32)
AST: 11 IU/L (ref 0–40)
Albumin/Globulin Ratio: 1.4 (ref 1.2–2.2)
Albumin: 4 g/dL (ref 3.8–4.9)
Alkaline Phosphatase: 105 IU/L (ref 44–121)
BUN/Creatinine Ratio: 19 (ref 9–23)
BUN: 13 mg/dL (ref 6–24)
Bilirubin Total: 0.3 mg/dL (ref 0.0–1.2)
CO2: 25 mmol/L (ref 20–29)
Calcium: 10.1 mg/dL (ref 8.7–10.2)
Chloride: 104 mmol/L (ref 96–106)
Creatinine, Ser: 0.7 mg/dL (ref 0.57–1.00)
Globulin, Total: 2.8 g/dL (ref 1.5–4.5)
Glucose: 100 mg/dL — ABNORMAL HIGH (ref 70–99)
Potassium: 5.2 mmol/L (ref 3.5–5.2)
Sodium: 143 mmol/L (ref 134–144)
Total Protein: 6.8 g/dL (ref 6.0–8.5)
eGFR: 100 mL/min/{1.73_m2} (ref >59)

## 2022-05-16 LAB — IRON,TIBC AND FERRITIN PANEL
Ferritin: 73 ng/mL (ref 15–150)
Iron Saturation: 8 % — CL (ref 15–55)
Iron: 28 ug/dL (ref 27–159)
Total Iron Binding Capacity: 372 ug/dL (ref 250–450)
UIBC: 344 ug/dL (ref 131–425)

## 2022-05-16 LAB — VITAMIN B12: Vitamin B-12: 432 pg/mL (ref 232–1245)

## 2022-05-17 ENCOUNTER — Other Ambulatory Visit (INDEPENDENT_AMBULATORY_CARE_PROVIDER_SITE_OTHER): Payer: 59

## 2022-05-17 DIAGNOSIS — D509 Iron deficiency anemia, unspecified: Secondary | ICD-10-CM

## 2022-05-17 LAB — IFOBT (OCCULT BLOOD): IFOBT: NEGATIVE

## 2022-05-17 NOTE — Progress Notes (Signed)
Andrea [name] is 6 weeks out from a robotic paraesophageal hernia repair.  Overall she is doing well.  No reflux and cough has improved significantly.  Feels her pulmonary status has improved She is eating breads and  carbohydrates... Continues to smoke. No vomiting  PE NAD Abd: soft, nt incisions c/d/I no infection  A/ p doing well  Pulm sxs improved No surgical issues at this time

## 2022-05-19 ENCOUNTER — Other Ambulatory Visit: Payer: Self-pay | Admitting: Family Medicine

## 2022-05-19 DIAGNOSIS — H2012 Chronic iridocyclitis, left eye: Secondary | ICD-10-CM

## 2022-05-20 ENCOUNTER — Ambulatory Visit: Payer: 59 | Admitting: Family Medicine

## 2022-05-21 DIAGNOSIS — J449 Chronic obstructive pulmonary disease, unspecified: Secondary | ICD-10-CM | POA: Diagnosis not present

## 2022-05-23 ENCOUNTER — Telehealth: Payer: Self-pay | Admitting: Family Medicine

## 2022-05-23 DIAGNOSIS — J9621 Acute and chronic respiratory failure with hypoxia: Secondary | ICD-10-CM | POA: Diagnosis not present

## 2022-05-23 NOTE — Telephone Encounter (Signed)
Called patient to schedule Medicare Annual Wellness Visit (AWV). Left message for patient to call back and schedule Medicare Annual Wellness Visit (AWV).  Last date of AWV: 04/12/2021  Please schedule an appointment at any time with [facility].  If any questions, please contact me at [phone].  Thank you ,  [place] Direct Dial: [phone]

## 2022-05-25 ENCOUNTER — Other Ambulatory Visit: Payer: Self-pay | Admitting: Family Medicine

## 2022-05-25 DIAGNOSIS — L930 Discoid lupus erythematosus: Secondary | ICD-10-CM

## 2022-05-27 NOTE — Telephone Encounter (Signed)
Requested medications are due for refill today.  yes  Requested medications are on the active medications list.  yes  Last refill. 12/11/2021 #60 5 rf  Future visit scheduled.   yes  Notes to clinic.  Refill not delegated.    Requested Prescriptions  Pending Prescriptions Disp Refills   hydroxychloroquine (PLAQUENIL) 200 MG tablet [Pharmacy Med Name: HYDROXYCHLOROQUINE 200 MG TAB] 60 tablet 5    Sig: TAKE 2 TABLETS (400 MG TOTAL) BY MOUTH DAILY FOR DISCOID LUPUS (L93.0)     Not Delegated - Analgesics:  Antirheumatic Agents - abatacept & hydroxychloroquine Failed - 05/25/2022  8:37 AM      Failed - This refill cannot be delegated      Passed - Valid encounter within last 6 months    Recent Outpatient Visits           1 week ago Other fatigue   [place], Donald E, MD   1 month ago COPD with acute exacerbation [facility])   [place] Joe T, FNP   3 months ago COPD exacerbation [facility])   [place], [place], PA-C   4 months ago Lupus [facility])   [place], Donald E, MD   6 months ago Lupus [facility])   [place], Donald E, MD       Future Appointments             In 3 weeks Fisher, Kirstie Peri, MD [facility], [place]

## 2022-06-05 ENCOUNTER — Telehealth: Payer: 59 | Admitting: Nurse Practitioner

## 2022-06-05 DIAGNOSIS — J440 Chronic obstructive pulmonary disease with acute lower respiratory infection: Secondary | ICD-10-CM

## 2022-06-05 DIAGNOSIS — J209 Acute bronchitis, unspecified: Secondary | ICD-10-CM | POA: Diagnosis not present

## 2022-06-05 MED ORDER — PREDNISONE 20 MG PO TABS: 20.0000 mg | ORAL_TABLET | Freq: Two times a day (BID) | ORAL | 0 refills | Status: AC

## 2022-06-05 MED ORDER — BENZONATATE 100 MG PO CAPS: 100.0000 mg | ORAL_CAPSULE | Freq: Three times a day (TID) | ORAL | 0 refills | Status: DC | PRN

## 2022-06-05 MED ORDER — AZITHROMYCIN 250 MG PO TABS: ORAL_TABLET | ORAL | 0 refills | Status: AC

## 2022-06-05 MED ORDER — PREDNISONE 20 MG PO TABS: 20.0000 mg | ORAL_TABLET | Freq: Two times a day (BID) | ORAL | 0 refills | Status: DC

## 2022-06-05 NOTE — Addendum Note (Signed)
Addended by: Mar Daring on: 06/05/2022 02:11 PM   Modules accepted: Orders

## 2022-06-05 NOTE — Progress Notes (Signed)
E-Visit for Cough  We are sorry that you are not feeling well.  Here is how we plan to help!  Based on your presentation I believe you most likely have A cough due to bacteria.  When patients have a fever and a productive cough with a change in color or increased sputum production, we are concerned about bacterial bronchitis.  If left untreated it can progress to pneumonia.  If your symptoms do not improve with your treatment plan it is important that you contact your provider.   I have prescribed Azithromyin 250 mg: two tablets now and then one tablet daily for 4 additonal days    In addition you may use A prescription cough medication called Tessalon Perles 100mg . You may take 1-2 capsules every 8 hours as needed for your cough.  Prednisone 20 mg twice daily for 5 days   From your responses in the eVisit questionnaire you describe inflammation in the upper respiratory tract which is causing a significant cough.  This is commonly called Bronchitis and has four common causes:   Allergies Viral Infections Acid Reflux Bacterial Infection Allergies, viruses and acid reflux are treated by controlling symptoms or eliminating the cause. An example might be a cough caused by taking certain blood pressure medications. You stop the cough by changing the medication. Another example might be a cough caused by acid reflux. Controlling the reflux helps control the cough.  USE OF BRONCHODILATOR ("RESCUE") INHALERS: There is a risk from using your bronchodilator too frequently.  The risk is that over-reliance on a medication which only relaxes the muscles surrounding the breathing tubes can reduce the effectiveness of medications prescribed to reduce swelling and congestion of the tubes themselves.  Although you feel brief relief from the bronchodilator inhaler, your asthma may actually be worsening with the tubes becoming more swollen and filled with mucus.  This can delay other crucial treatments, such as oral  steroid medications. If you need to use a bronchodilator inhaler daily, several times per day, you should discuss this with your provider.  There are probably better treatments that could be used to keep your asthma under control.     HOME CARE Only take medications as instructed by your medical team. Complete the entire course of an antibiotic. Drink plenty of fluids and get plenty of rest. Avoid close contacts especially the very young and the elderly Cover your mouth if you cough or cough into your sleeve. Always remember to wash your hands A steam or ultrasonic humidifier can help congestion.   GET HELP RIGHT AWAY IF: You develop worsening fever. You become short of breath You cough up blood. Your symptoms persist after you have completed your treatment plan MAKE SURE YOU  Understand these instructions. Will watch your condition. Will get help right away if you are not doing well or get worse.    Thank you for choosing an e-visit.  Your e-visit answers were reviewed by a board certified advanced clinical practitioner to complete your personal care plan. Depending upon the condition, your plan could have included both over the counter or prescription medications.  Please review your pharmacy choice. Make sure the pharmacy is open so you can pick up prescription now. If there is a problem, you may contact your provider through [employer] and have the prescription routed to another pharmacy.  Your safety is important to [place]. If you have drug allergies check your prescription carefully.   For the next 24 hours you can use MyChart  to ask questions about today's visit, request a non-urgent call back, or ask for a work or school excuse. You will get an email in the next two days asking about your experience. I hope that your e-visit has been valuable and will speed your recovery.   Meds ordered this encounter  Medications   azithromycin (ZITHROMAX) 250 MG tablet    Sig: Take 2  tablets on day 1, then 1 tablet daily on days 2 through 5    Dispense:  6 tablet    Refill:  0   benzonatate (TESSALON) 100 MG capsule    Sig: Take 1 capsule (100 mg total) by mouth 3 (three) times daily as needed.    Dispense:  30 capsule    Refill:  0   predniSONE (DELTASONE) 20 MG tablet    Sig: Take 1 tablet (20 mg total) by mouth 2 (two) times daily with a meal for 5 days.    Dispense:  10 tablet    Refill:  0    I spent approximately 5 minutes reviewing the patient's history, current symptoms and coordinating their care today.

## 2022-06-06 ENCOUNTER — Encounter: Payer: Self-pay | Admitting: Pulmonary Disease

## 2022-06-06 ENCOUNTER — Ambulatory Visit (INDEPENDENT_AMBULATORY_CARE_PROVIDER_SITE_OTHER): Payer: 59 | Admitting: Pulmonary Disease

## 2022-06-06 VITALS — BP 120/80 | HR 80 | Temp 97.3°F | Ht 63.0 in | Wt 148.0 lb

## 2022-06-06 DIAGNOSIS — J449 Chronic obstructive pulmonary disease, unspecified: Secondary | ICD-10-CM | POA: Diagnosis not present

## 2022-06-06 DIAGNOSIS — F17210 Nicotine dependence, cigarettes, uncomplicated: Secondary | ICD-10-CM | POA: Diagnosis not present

## 2022-06-06 DIAGNOSIS — J4489 Other specified chronic obstructive pulmonary disease: Secondary | ICD-10-CM

## 2022-06-06 DIAGNOSIS — J440 Chronic obstructive pulmonary disease with acute lower respiratory infection: Secondary | ICD-10-CM | POA: Diagnosis not present

## 2022-06-06 DIAGNOSIS — J209 Acute bronchitis, unspecified: Secondary | ICD-10-CM | POA: Diagnosis not present

## 2022-06-06 DIAGNOSIS — G4736 Sleep related hypoventilation in conditions classified elsewhere: Secondary | ICD-10-CM | POA: Diagnosis not present

## 2022-06-06 MED ORDER — BREZTRI AEROSPHERE 160-9-4.8 MCG/ACT IN AERO: 2.0000 | INHALATION_SPRAY | Freq: Two times a day (BID) | RESPIRATORY_TRACT | 0 refills | Status: DC

## 2022-06-06 NOTE — Progress Notes (Signed)
Subjective:    Patient ID: Andrea [name], female    DOB: [birth year], 57 y.o.   MRN: [id number] Patient Care Team: Malva Limes, MD as PCP - General (Family Medicine) Salena Saner, MD as Consulting Physician (Pulmonary Disease)  Chief Complaint  Patient presents with   Follow-up    SOB with exertion. No wheezing. Dry cough.     HPI Andrea [name] is a 57 year old current smoker (half PPD, 20 PY) with stage II COPD who presents for follow-up on the same.  This is a scheduled visit.  Since her last visit here on 11 February 2022 at that time she was awaiting surgery for a paraesophageal hernia and severe gastroesophageal reflux. She had a barium swallow on 27 November that showed severe gastroesophageal reflux extending all the way up into her chest and likely the cause of her cough and shortness of breath.  She underwent paraesophageal hernia repair and antireflux surgery by Dr. Everlene Farrier on 28 March 2022.  She did well postoperatively and notes that this has been very helpful to her.  She feels that her COPD symptoms have been better controlled since her reflux is controlled.  She was seen via virtual visit by primary care yesterday due to symptoms of cough and and nasal congestion.  She was given some prednisone and azithromycin.  She does not appear ill on today's visit.  With regards to her COPD she is currently maintained on Breztri 2 puffs twice a day and this controls her symptoms however notes that towards the second dose of the day she becomes short of breath.  She is however extending the dosing interval to up to 14 hours and she was advised that she should dose it every 12 hours.  She uses levo albuterol as rescue 1-2 times a week.  She has not had any recent fevers, chills or sweats even with the symptoms she was seen for yesterday.  No wheezing.  No chest pain, orthopnea or paroxysmal nocturnal dyspnea, no lower extremity edema or calf tenderness.  Does not endorse any other  symptomatology today she states that after 1 dose of prednisone and Azithromycin she feels markedly better.  She has been referred to lung cancer screening program previously but has not scheduled a scan yet.  She was advised of the importance of this program.  We will refer her again today. She is on nocturnal oxygen at 1 L/min and is compliant with the therapy notes improvement on her symptoms with the therapy.   DATA 06/18/2016 PFTs: FEV1 1.45 L or 60% predicted VC 2.38 L or 90% predicted FEV1/FVC 61%.  Lung volumes: TLC 102%, RV 140%. DLCO 57%.  No significant bronchodilator response. 09/25/2016 2D echo: LVEF 60 to 65%, normal study. 09/01/2020 overnight oximetry: No significant desaturations. 01/15/2021 PFTs: FEV1 1.46 L or 57% predicted, FVC 2.31 L or 70% predicted, FEV1/FVC 63%, no bronchodilator response.  TLC 113, RV 200% diffusion capacity 57%, correction by Kco 67%.  Review of Systems A 10 point review of systems was performed and it is as noted above otherwise negative.  Patient Active Problem List   Diagnosis Date Noted   Hiatal hernia 03/28/2022   S/P repair of paraesophageal hernia 03/28/2022   Acute gastritis without hemorrhage 02/21/2022   Chronic anterior uveitis of left eye 10/04/2021   Personal history of colonic polyps    Varicose veins of bilateral lower extremities with pain 11/22/2016   Restless leg 11/18/2016   Iron deficiency 11/18/2016   Allergic rhinitis  06/11/2016   Hyperlipidemia 03/19/2015   COPD (chronic obstructive pulmonary disease) 10/04/2014   Depression 09/22/2014   Dyshidrosis 09/22/2014   GERD (gastroesophageal reflux disease) 09/22/2014   Insomnia 09/22/2014   Lung nodule, multiple 09/22/2014   Panic disorder 09/22/2014   Palpitations 01/17/2014   Pulmonary nodule 06/10/2013   Shortness of breath 05/20/2013   COPD, GOLD B 05/20/2013   Tobacco abuse 05/20/2013   Daytime somnolence 05/20/2013   Back pain 08/28/2012   Discoid lupus  05/30/2012   Pain in joint 03/09/2012   Psoriasis 02/21/2012   Fibromyalgia 02/11/2012   Lupus 02/11/2012   Panic attacks 02/11/2012   History of adenomatous polyp of colon 09/13/2011   Heartburn 08/06/2011   PVC's (premature ventricular contractions) 01/29/2011   Essential (primary) hypertension 03/04/1998   Social History   Tobacco Use   Smoking status: Every Day    Packs/day: 1.00    Years: 20.00    Additional pack years: 0.00    Total pack years: 20.00    Types: Cigarettes    Passive exposure: Past   Smokeless tobacco: Never   Tobacco comments:    0.5 PPD -06/06/2022  khj  Substance Use Topics   Alcohol use: No    Alcohol/week: 0.0 standard drinks of alcohol   Allergies  Allergen Reactions   Alum & Mag Hydroxide-Simeth Diarrhea and Nausea Only   Bactrim [Sulfamethoxazole-Trimethoprim] Itching   Cefdinir Other (See Comments)    tachycardia   Chantix [Varenicline]     Bad dreams   Chlorhexidine Gluconate Swelling   Doxycycline Other (See Comments)    Nausea, migraine   Multaq [Dronedarone] Other (See Comments)    Lip/ mouth numbness/ tongue swelling   Ivp Dye [Iodinated Contrast Media] Palpitations   Current Meds  Medication Sig   albuterol (PROVENTIL) (2.5 MG/3ML) 0.083% nebulizer solution USE 1 VIAL IN NEBULIZER EVERY 4 (FOUR) HOURS AS NEEDED FOR WHEEZING OR SHORTNESS OF BREATH.   albuterol (VENTOLIN HFA) 108 (90 Base) MCG/ACT inhaler TAKE 2 PUFFS BY MOUTH EVERY 6 HOURS AS NEEDED FOR WHEEZE OR SHORTNESS OF BREATH   atorvastatin (LIPITOR) 20 MG tablet Take 1 tablet (20 mg total) by mouth daily.   azithromycin (ZITHROMAX) 250 MG tablet Take 2 tablets on day 1, then 1 tablet daily on days 2 through 5   BREZTRI AEROSPHERE 160-9-4.8 MCG/ACT AERO INHALE 2 PUFFS INTO THE LUNGS IN THE MORNING AND AT BEDTIME.   clonazePAM (KLONOPIN) 1 MG tablet Take 1 tablet (1 mg total) by mouth 2 (two) times daily as needed.   dexlansoprazole (DEXILANT) 60 MG capsule TAKE 1 CAPSULE BY  MOUTH EVERY DAY   fluticasone (FLONASE) 50 MCG/ACT nasal spray Place 2 sprays into both nostrils daily. (Patient taking differently: Place 2 sprays into both nostrils as needed.)   hydroxychloroquine (PLAQUENIL) 200 MG tablet TAKE 2 TABLETS (400 MG TOTAL) BY MOUTH DAILY FOR DISCOID LUPUS (L93.0)   Misc. Devices (PULSE OXIMETER DELUXE) MISC Use daily to monitor oxygen levels   montelukast (SINGULAIR) 10 MG tablet Take 1 tablet (10 mg total) by mouth at bedtime. (Patient taking differently: Take 10 mg by mouth as needed.)   nitroGLYCERIN (NITROSTAT) 0.4 MG SL tablet Place 1 tablet (0.4 mg total) under the tongue every 5 (five) minutes as needed for chest pain.   OXYGEN Inhale 1 L into the lungs as needed.   prednisoLONE acetate (PRED FORTE) 1 % ophthalmic suspension PLACE 1 DROP INTO THE LEFT EYE 4 (FOUR) TIMES DAILY FOR AT LEAST TWO DAYS  AND CONTINUE UNTIL SYMPTOMS IMPROVE, ONCE SYMPTOMS IMPROVE PLEASE TAPER DOSING, 3 TIMES DAY, TWICE DAILY, ONCE DAILY. DO NOT TAKE WITH YOUR OTHER EYE DROP -DUREZOL   predniSONE (DELTASONE) 20 MG tablet Take 1 tablet (20 mg total) by mouth 2 (two) times daily with a meal for 5 days.   Immunization History  Administered Date(s) Administered   Moderna Sars-Covid-2 Vaccination 11/28/2019, 12/26/2019   Pneumococcal Conjugate-13 11/20/2012   Pneumococcal Polysaccharide-23 08/10/2012       Objective:   Physical Exam BP 120/80 (BP Location: Left Arm, Cuff Size: Normal)   Pulse 80   Temp (!) 97.3 F (36.3 C)   Ht 5\' 3"  (1.6 m)   Wt 148 lb (67.1 kg)   SpO2 99%   BMI 26.22 kg/m   SpO2: 99 % O2 Device: None (Room air)  GENERAL: Awake, alert, fully ambulatory.  In no respiratory distress.  No conversational dyspnea.   HEAD: Normocephalic, atraumatic.  EYES: Pupils equal, round, reactive to light.  No scleral icterus.  MOUTH: Dentures upper and lower. NECK: Supple. No thyromegaly. Trachea midline. No JVD.  No adenopathy. PULMONARY: Good air entry bilaterally.   There are coarse breath sounds throughout, no wheezes. CARDIOVASCULAR: S1 and S2. Regular rate and rhythm.  No rubs, murmurs or gallops heard. ABDOMEN: Benign. MUSCULOSKELETAL: No joint deformity, no clubbing, no edema.  NEUROLOGIC: No overt focal deficit.  Speech is fluent.  No gait disturbance. SKIN: Intact,warm,dry. PSYCH: Normal mood and behavior.     Assessment & Plan:     ICD-10-CM   1. Stage 2 moderate COPD by GOLD classification  J44.9    No exacerbation Continue Breztri 2 puffs twice a day Advised to use 12 hour schedule for [employer] as needed albuterol    2. Acute bronchitis with COPD  J44.0    J20.9    She is on prednisone and Azithromycin Started on 05 June 2022 by primary Query allergic patient with benign exam today    3. Nocturnal hypoxemia due to obstructive chronic bronchitis  J44.89    G47.36    Patient compliant with oxygen therapy Notes benefit from the therapy    4. Tobacco dependence due to cigarettes  F17.210 Ambulatory Referral for Lung Cancer Scre   Patient counseled regards discontinuation of smoking Counseling time 3 to 5 minutes She has been referred to lung cancer screening program Not scheduled yet     Orders Placed This Encounter  Procedures   Ambulatory Referral for Lung Cancer Scre    Referral Priority:   Routine    Referral Type:   Consultation    Referral Reason:   Specialty Services Required    Number of Visits Requested:   1   Meds ordered this encounter  Medications   Budeson-Glycopyrrol-Formoterol (BREZTRI AEROSPHERE) 160-9-4.8 MCG/ACT AERO    Sig: Inhale 2 puffs into the lungs in the morning and at bedtime.    Dispense:  11.8 g    Refill:  0    Order Specific Question:   Lot Number?    Answer:   [id number]    Order Specific Question:   Expiration Date?    Answer:   10/02/2024    Order Specific Question:   Manufacturer?    Answer:   [employer] [[id number]]    Order Specific Question:   Quantity    Answer:   2   We have  placed another referral to lung cancer screening program.  She has not been scheduled yet suspect due to other  scheduling conflicts.  Patient overall appears to be doing well.  She was instructed to use her Breztri 12 hours apart.  She had been prolonging the thing to 14 hours and noting seeing shortness of breath towards the second dose.  Advised her that the medication needs to be dosed every 12 hours.  She was counseled with regards to discontinuation of smoking.  Will see her in follow-up in 3 to  4 months time she is to contact us prior to that time should any new difficulties arise.  Gailen Shelter, MD Advanced Bronchoscopy P[facility]    *This note was dictated using voice recognition software/Dragon.  Despite best efforts to proofread, errors can occur which can change the meaning. Any transcriptional errors that result from this process are unintentional and may not be fully corrected at the time of dictation.

## 2022-06-06 NOTE — Patient Instructions (Signed)
Your lungs sounded clear today.  We are going to investigate why you are lung cancer screening CT has not been scheduled.  Continue Breztri 2 puffs twice a day.  Sure you space the Breztri to 12 hours and no longer.  Example: 7 AM and 7 PM dosing.  Please work on quitting smoking.  We will see him in follow-up in 3 to 4 months time call sooner should any new problems arise.

## 2022-06-07 ENCOUNTER — Encounter: Payer: Self-pay | Admitting: Pulmonary Disease

## 2022-06-10 ENCOUNTER — Other Ambulatory Visit: Payer: Self-pay | Admitting: Family Medicine

## 2022-06-10 DIAGNOSIS — G4730 Sleep apnea, unspecified: Secondary | ICD-10-CM | POA: Diagnosis not present

## 2022-06-10 DIAGNOSIS — J449 Chronic obstructive pulmonary disease, unspecified: Secondary | ICD-10-CM | POA: Diagnosis not present

## 2022-06-11 NOTE — Telephone Encounter (Signed)
Requested Prescriptions  Pending Prescriptions Disp Refills   albuterol (VENTOLIN HFA) 108 (90 Base) MCG/ACT inhaler [Pharmacy Med Name: ALBUTEROL HFA (PROAIR) INHALER] 8.5 each 4    Sig: INHALE 2 PUFFS BY MOUTH EVERY 6 HOURS AS NEEDED FOR WHEEZE OR SHORTNESS OF BREATH     Pulmonology:  Beta Agonists 2 Passed - 06/10/2022 10:13 PM      Passed - Last BP in normal range    BP Readings from Last 1 Encounters:  06/06/22 120/80         Passed - Last Heart Rate in normal range    Pulse Readings from Last 1 Encounters:  06/06/22 80         Passed - Valid encounter within last 12 months    Recent Outpatient Visits           3 weeks ago Other fatigue   [place] [facility] Malva Limes, MD   1 month ago COPD with acute exacerbation [facility])   [facility] Merita Norton T, FNP   3 months ago COPD exacerbation [facility])   [place] [facility] [place], [place], PA-C   5 months ago Lupus [facility])   [facility] Malva Limes, MD   6 months ago Lupus [facility])   [facility] Malva Limes, MD       Future Appointments             In 6 days Fisher, Demetrios Isaacs, MD [facility], [facility]

## 2022-06-14 ENCOUNTER — Other Ambulatory Visit: Payer: Self-pay | Admitting: Gastroenterology

## 2022-06-14 ENCOUNTER — Other Ambulatory Visit: Payer: Self-pay | Admitting: *Deleted

## 2022-06-14 DIAGNOSIS — Z122 Encounter for screening for malignant neoplasm of respiratory organs: Secondary | ICD-10-CM

## 2022-06-14 DIAGNOSIS — F17210 Nicotine dependence, cigarettes, uncomplicated: Secondary | ICD-10-CM

## 2022-06-14 DIAGNOSIS — Z87891 Personal history of nicotine dependence: Secondary | ICD-10-CM

## 2022-06-17 ENCOUNTER — Ambulatory Visit (INDEPENDENT_AMBULATORY_CARE_PROVIDER_SITE_OTHER): Payer: 59 | Admitting: Family Medicine

## 2022-06-17 ENCOUNTER — Encounter: Payer: Self-pay | Admitting: Family Medicine

## 2022-06-17 VITALS — BP 123/75 | HR 83 | Ht 63.0 in | Wt 147.0 lb

## 2022-06-17 DIAGNOSIS — E611 Iron deficiency: Secondary | ICD-10-CM

## 2022-06-17 DIAGNOSIS — H65111 Acute and subacute allergic otitis media (mucoid) (sanguinous) (serous), right ear: Secondary | ICD-10-CM

## 2022-06-17 MED ORDER — AMOXICILLIN 500 MG PO CAPS: 1000.0000 mg | ORAL_CAPSULE | Freq: Two times a day (BID) | ORAL | 0 refills | Status: AC

## 2022-06-17 NOTE — Progress Notes (Signed)
Established patient visit   Patient: Andrea [name]   DOB: 12/15/1965   57 y.o. Female  MRN: [id number] Visit Date: 06/17/2022  Today's healthcare provider: Mila Merry, MD    Subjective    HPI  F/u--pt stated still feeling fatigue. Was seen in March with some mild iron deficiency Lab Results  Component Value Date   IRON 28 05/15/2022   TIBC 372 05/15/2022   FERRITIN 73 05/15/2022  Has since started iron supplement. Best of labs were normal Lab Results  Component Value Date   WBC 6.2 05/15/2022   HGB 13.5 05/15/2022   HCT 42.1 05/15/2022   MCV 80 05/15/2022   PLT 379 05/15/2022   Last metabolic panel Lab Results  Component Value Date   GLUCOSE 100 (H) 05/15/2022   NA 143 05/15/2022   K 5.2 05/15/2022   CL 104 05/15/2022   CO2 25 05/15/2022   BUN 13 05/15/2022   CREATININE 0.70 05/15/2022   EGFR 100 05/15/2022   CALCIUM 10.1 05/15/2022   PROT 6.8 05/15/2022   ALBUMIN 4.0 05/15/2022   LABGLOB 2.8 05/15/2022   AGRATIO 1.4 05/15/2022   BILITOT 0.3 05/15/2022   ALKPHOS 105 05/15/2022   AST 11 05/15/2022   ALT 12 05/15/2022   ANIONGAP 5 03/29/2022   Lab Results  Component Value Date   VITAMINB12 432 05/15/2022   OC-Lyte 05/17/2022 - Negative  Sinus problem and right ear ache.  Medications: Outpatient Medications Prior to Visit  Medication Sig   albuterol (PROVENTIL) (2.5 MG/3ML) 0.083% nebulizer solution USE 1 VIAL IN NEBULIZER EVERY 4 (FOUR) HOURS AS NEEDED FOR WHEEZING OR SHORTNESS OF BREATH.   albuterol (VENTOLIN HFA) 108 (90 Base) MCG/ACT inhaler INHALE 2 PUFFS BY MOUTH EVERY 6 HOURS AS NEEDED FOR WHEEZE OR SHORTNESS OF BREATH   atorvastatin (LIPITOR) 20 MG tablet Take 1 tablet (20 mg total) by mouth daily.   BREZTRI AEROSPHERE 160-9-4.8 MCG/ACT AERO INHALE 2 PUFFS INTO THE LUNGS IN THE MORNING AND AT BEDTIME.   Budeson-Glycopyrrol-Formoterol (BREZTRI AEROSPHERE) 160-9-4.8 MCG/ACT AERO Inhale 2 puffs into the lungs in the morning and at bedtime.    clonazePAM (KLONOPIN) 1 MG tablet Take 1 tablet (1 mg total) by mouth 2 (two) times daily as needed.   dexlansoprazole (DEXILANT) 60 MG capsule TAKE 1 CAPSULE BY MOUTH EVERY DAY   fluticasone (FLONASE) 50 MCG/ACT nasal spray Place 2 sprays into both nostrils daily. (Patient taking differently: Place 2 sprays into both nostrils as needed.)   hydroxychloroquine (PLAQUENIL) 200 MG tablet TAKE 2 TABLETS (400 MG TOTAL) BY MOUTH DAILY FOR DISCOID LUPUS (L93.0)   levalbuterol (XOPENEX HFA) 45 MCG/ACT inhaler Inhale 2 puffs into the lungs every 4 (four) hours as needed for wheezing.   methocarbamol (ROBAXIN) 500 MG tablet Take 1 tablet (500 mg total) by mouth every 8 (eight) hours as needed for muscle spasms.   Misc. Devices (PULSE OXIMETER DELUXE) MISC Use daily to monitor oxygen levels   montelukast (SINGULAIR) 10 MG tablet Take 1 tablet (10 mg total) by mouth at bedtime. (Patient taking differently: Take 10 mg by mouth as needed.)   Nebulizers (COMPRESSOR/NEBULIZER) MISC Supplies only   Nebulizers (COMPRESSOR/NEBULIZER) MISC Use as directed   nitroGLYCERIN (NITROSTAT) 0.4 MG SL tablet Place 1 tablet (0.4 mg total) under the tongue every 5 (five) minutes as needed for chest pain.   ondansetron (ZOFRAN) 4 MG tablet Take 1 tablet (4 mg total) by mouth every 8 (eight) hours as needed for nausea or  vomiting.   OXYGEN Inhale 1 L into the lungs as needed.   oxymetazoline (AFRIN NASAL SPRAY) 0.05 % nasal spray 2 sprays per nare for acute nasal bleeding/epistaxis   prednisoLONE acetate (PRED FORTE) 1 % ophthalmic suspension PLACE 1 DROP INTO THE LEFT EYE 4 (FOUR) TIMES DAILY FOR AT LEAST TWO DAYS AND CONTINUE UNTIL SYMPTOMS IMPROVE, ONCE SYMPTOMS IMPROVE PLEASE TAPER DOSING, 3 TIMES DAY, TWICE DAILY, ONCE DAILY. DO NOT TAKE WITH YOUR OTHER EYE DROP -DUREZOL   Respiratory Therapy Supplies (FLUTTER) DEVI 1 Device by Does not apply route daily.   Spacer/Aero-Holding Chambers (AEROCHAMBER MV) inhaler Use as  instructed   Spacer/Aero-Holding Chambers (AEROCHAMBER MV) inhaler Use as instructed   [DISCONTINUED] benzonatate (TESSALON) 100 MG capsule Take 1 capsule (100 mg total) by mouth 3 (three) times daily as needed. (Patient not taking: Reported on 05/15/2022)   [DISCONTINUED] benzonatate (TESSALON) 100 MG capsule Take 1 capsule (100 mg total) by mouth 3 (three) times daily as needed. (Patient not taking: Reported on 06/06/2022)   [DISCONTINUED] cyclobenzaprine (FLEXERIL) 5 MG tablet Take 1 tablet (5 mg total) by mouth 3 (three) times daily as needed for muscle spasms. (Patient not taking: Reported on 06/06/2022)   [DISCONTINUED] ibuprofen (ADVIL) 600 MG tablet Take 1 tablet (600 mg total) by mouth every 6 (six) hours as needed. (Patient not taking: Reported on 05/15/2022)   [DISCONTINUED] oxyCODONE (OXY IR/ROXICODONE) 5 MG immediate release tablet Take 1 tablet (5 mg total) by mouth every 6 (six) hours as needed for severe pain or breakthrough pain. (Patient not taking: Reported on 05/15/2022)   No facility-administered medications prior to visit.    Review of Systems  Constitutional:  Positive for fatigue. Negative for fever.  HENT:  Positive for congestion.        Objective    BP 123/75 (BP Location: Right Arm, Patient Position: Sitting, Cuff Size: Normal)   Pulse 83   Ht 5\' 3"  (1.6 m)   Wt 147 lb (66.7 kg)   SpO2 96%   BMI 26.04 kg/m    Physical Exam  General Appearance:    Well developed, well nourished female, alert, cooperative, in no acute distress  HENT:   Bilateral TM normal without fluid or infection, frontal sinus tender, and nasal mucosa pale and congested  Eyes:    PERRL, conjunctiva/corneas clear, EOM's intact       Lungs:     Clear to auscultation bilaterally, respirations unlabored  Heart:    Normal heart rate. Normal rhythm. No murmurs, rubs, or gallops.    Neurologic:   Awake, alert, oriented x 3. No apparent focal neurological           defect.         Assessment & Plan      1. Iron deficiency  - Iron, TIBC and Ferritin Panel  2. Non-recurrent acute allergic otitis media of right ear  - amoxicillin (AMOXIL) 500 MG capsule; Take 2 capsules (1,000 mg total) by mouth 2 (two) times daily for 7 days.  Dispense: 28 capsule; Refill: 0          Mila Merry, MD  [facility] [phone] (phone) [phone] (fax)  [facility]

## 2022-06-18 LAB — IRON,TIBC AND FERRITIN PANEL
Ferritin: 21 ng/mL (ref 15–150)
Iron Saturation: 9 % — CL (ref 15–55)
Iron: 38 ug/dL (ref 27–159)
Total Iron Binding Capacity: 408 ug/dL (ref 250–450)
UIBC: 370 ug/dL (ref 131–425)

## 2022-06-19 ENCOUNTER — Telehealth: Payer: Self-pay | Admitting: Family Medicine

## 2022-06-19 NOTE — Telephone Encounter (Signed)
Advised and appointment made 

## 2022-06-19 NOTE — Telephone Encounter (Signed)
Sleep study was normal, no sign of apnea, may all just be related to low iron levels. Continue current medications.  Please schedule follow up 3-4 months.

## 2022-06-23 DIAGNOSIS — J9621 Acute and chronic respiratory failure with hypoxia: Secondary | ICD-10-CM | POA: Diagnosis not present

## 2022-06-28 ENCOUNTER — Other Ambulatory Visit: Payer: Self-pay | Admitting: Family Medicine

## 2022-06-28 DIAGNOSIS — H2012 Chronic iridocyclitis, left eye: Secondary | ICD-10-CM

## 2022-06-28 NOTE — Telephone Encounter (Signed)
Requested medications are due for refill today.  Unsure  Requested medications are on the active medications list.  yes  Last refill. 05/21/2022 5mL 0 rf  Future visit scheduled.   yes  Notes to clinic.  Refill not delegated.    Requested Prescriptions  Pending Prescriptions Disp Refills   prednisoLONE acetate (PRED FORTE) 1 % ophthalmic suspension [Pharmacy Med Name: PREDNISOLONE AC 1% EYE DROP] 5 mL 0    Sig: PLACE 1 DROP INTO THE LEFT EYE 4 (FOUR) TIMES DAILY FOR AT LEAST TWO DAYS AND CONTINUE UNTIL SYMPTOMS IMPROVE, ONCE SYMPTOMS IMPROVE PLEASE TAPER DOSING, 3 TIMES DAY, TWICE DAILY, ONCE DAILY. DO NOT TAKE WITH YOUR OTHER EYE DROP -DUREZOL     Off-Protocol Failed - 06/28/2022  4:53 PM      Failed - Medication not assigned to a protocol, review manually.      Passed - Valid encounter within last 12 months    Recent Outpatient Visits           1 week ago Iron deficiency   [place] [facility] Malva Limes, MD   1 month ago Other fatigue   [facility] Malva Limes, MD   2 months ago COPD with acute exacerbation [facility])   [place] [facility] Merita Norton T, FNP   4 months ago COPD exacerbation [facility])   [place] [facility] [place], PA-C   5 months ago Lupus [facility])   [facility] Malva Limes, MD       Future Appointments             In 2 months Fisher, Demetrios Isaacs, MD [facility], [facility]

## 2022-07-01 DIAGNOSIS — J449 Chronic obstructive pulmonary disease, unspecified: Secondary | ICD-10-CM | POA: Diagnosis not present

## 2022-07-05 ENCOUNTER — Other Ambulatory Visit: Payer: Self-pay | Admitting: Family Medicine

## 2022-07-05 ENCOUNTER — Telehealth: Payer: 59 | Admitting: Family Medicine

## 2022-07-05 DIAGNOSIS — J440 Chronic obstructive pulmonary disease with acute lower respiratory infection: Secondary | ICD-10-CM

## 2022-07-05 DIAGNOSIS — J209 Acute bronchitis, unspecified: Secondary | ICD-10-CM | POA: Diagnosis not present

## 2022-07-05 DIAGNOSIS — R0981 Nasal congestion: Secondary | ICD-10-CM

## 2022-07-05 MED ORDER — AZITHROMYCIN 250 MG PO TABS: ORAL_TABLET | ORAL | 0 refills | Status: AC

## 2022-07-05 MED ORDER — PREDNISONE 10 MG (21) PO TBPK: ORAL_TABLET | ORAL | 0 refills | Status: DC

## 2022-07-05 MED ORDER — BENZONATATE 200 MG PO CAPS: 200.0000 mg | ORAL_CAPSULE | Freq: Two times a day (BID) | ORAL | 0 refills | Status: AC | PRN

## 2022-07-05 NOTE — Progress Notes (Signed)
E-Visit for Cough  We are sorry that you are not feeling well.  Here is how we plan to help!  Based on your presentation I believe you most likely have A cough due to bacteria.  When patients have a fever and a productive cough with a change in color or increased sputum production, we are concerned about bacterial bronchitis.  If left untreated it can progress to pneumonia.  If your symptoms do not improve with your treatment plan it is important that you contact your provider.   I have prescribed Azithromyin 250 mg: two tablets now and then one tablet daily for 4 additonal days    In addition you may use A prescription cough medication called Tessalon Perles 100mg. You may take 1-2 capsules every 8 hours as needed for your cough.  Prednisone 10 mg daily for 6 days (see taper instructions below)  From your responses in the eVisit questionnaire you describe inflammation in the upper respiratory tract which is causing a significant cough.  This is commonly called Bronchitis and has four common causes:   Allergies Viral Infections Acid Reflux Bacterial Infection Allergies, viruses and acid reflux are treated by controlling symptoms or eliminating the cause. An example might be a cough caused by taking certain blood pressure medications. You stop the cough by changing the medication. Another example might be a cough caused by acid reflux. Controlling the reflux helps control the cough.  USE OF BRONCHODILATOR ("RESCUE") INHALERS: There is a risk from using your bronchodilator too frequently.  The risk is that over-reliance on a medication which only relaxes the muscles surrounding the breathing tubes can reduce the effectiveness of medications prescribed to reduce swelling and congestion of the tubes themselves.  Although you feel brief relief from the bronchodilator inhaler, your asthma may actually be worsening with the tubes becoming more swollen and filled with mucus.  This can delay other crucial  treatments, such as oral steroid medications. If you need to use a bronchodilator inhaler daily, several times per day, you should discuss this with your provider.  There are probably better treatments that could be used to keep your asthma under control.     HOME CARE Only take medications as instructed by your medical team. Complete the entire course of an antibiotic. Drink plenty of fluids and get plenty of rest. Avoid close contacts especially the very young and the elderly Cover your mouth if you cough or cough into your sleeve. Always remember to wash your hands A steam or ultrasonic humidifier can help congestion.   GET HELP RIGHT AWAY IF: You develop worsening fever. You become short of breath You cough up blood. Your symptoms persist after you have completed your treatment plan MAKE SURE YOU  Understand these instructions. Will watch your condition. Will get help right away if you are not doing well or get worse.    Thank you for choosing an e-visit.  Your e-visit answers were reviewed by a board certified advanced clinical practitioner to complete your personal care plan. Depending upon the condition, your plan could have included both over the counter or prescription medications.  Please review your pharmacy choice. Make sure the pharmacy is open so you can pick up prescription now. If there is a problem, you may contact your provider through MyChart messaging and have the prescription routed to another pharmacy.  Your safety is important to us. If you have drug allergies check your prescription carefully.   For the next 24 hours you can   use MyChart to ask questions about today's visit, request a non-urgent call back, or ask for a work or school excuse. You will get an email in the next two days asking about your experience. I hope that your e-visit has been valuable and will speed your recovery.    have provided 5 minutes of non face to face time during this encounter for  chart review and documentation.   

## 2022-07-09 ENCOUNTER — Ambulatory Visit: Payer: Self-pay | Admitting: *Deleted

## 2022-07-09 NOTE — Telephone Encounter (Signed)
Per Agent: "sinus infection symptoms   Congestion in face, nose and she still feels bad. She is not sure if she need another zpak or another antibiotic because she took her last pill today. Please f/u with patient to advise"      Chief Complaint: Sinus Congestion Symptoms: Sinus pain, right earache. Nasal drainage, cough, yellowish Frequency: Did E Visit 07/05/22 Pertinent Negatives: Patient denies Fever, SOB Disposition: [] ED /[] Urgent Care (no appt availability in office) / [] Appointment(In office/virtual)/ []  [facility]/ [] Home Care/ [x] Refused Recommended Disposition /[] [facility]/ [x]  Follow-up with PCP Additional Notes: Pt completed Zpack today. States right ear pain new, as well as yellowish nasal drainage. Initially called to request another Zpack. Advised would need appt. States she would just continue with Mucinex and "See how it goes." Assured pt NT would route to practice for PCPs review and final disposition. States she can not make it to appt today. Please advise.  Reason for Disposition  Earache  Answer Assessment - Initial Assessment Questions 1. LOCATION: "Where does it hurt?"      Right sided, earache, sinus pressure, pain 2. ONSET: "When did the sinus pain start?"  (e.g., hours, days)      Did evisit 07/05/22 3. SEVERITY: "How bad is the pain?"   (Scale 1-10; mild, moderate or severe)   - MILD (1-3): doesn't interfere with normal activities    - MODERATE (4-7): interferes with normal activities (e.g., work or school) or awakens from sleep   - SEVERE (8-10): excruciating pain and patient unable to do any normal activities        "Litle better, not much" 4. RECURRENT SYMPTOM: "Have you ever had sinus problems before?" If Yes, ask: "When was the last time?" and "What happened that time?"      Yes 5. NASAL CONGESTION: "Is the nose blocked?" If Yes, ask: "Can you open it or must you breathe through your mouth?"     varies 6. NASAL DISCHARGE: "Do you  have discharge from your nose?" If so ask, "What color?"     Now mostly yellowish 7. FEVER: "Do you have a fever?" If Yes, ask: "What is it, how was it measured, and when did it start?"      no 8. OTHER SYMPTOMS: "Do you have any other symptoms?" (e.g., sore throat, cough, earache, difficulty breathing)     cough "  Protocols used: Sinus Pain or Congestion-A-AH

## 2022-07-10 ENCOUNTER — Telehealth: Payer: Self-pay | Admitting: Family Medicine

## 2022-07-10 DIAGNOSIS — J441 Chronic obstructive pulmonary disease with (acute) exacerbation: Secondary | ICD-10-CM

## 2022-07-10 MED ORDER — LEVOFLOXACIN 500 MG PO TABS: 500.0000 mg | ORAL_TABLET | Freq: Every day | ORAL | 0 refills | Status: AC

## 2022-07-10 NOTE — Telephone Encounter (Signed)
Patient stated she has been dealing with a sinus infection for weeks and has already taken all her antibiotics and the zpak that was prescribed. She stated levofloxacin (LEVAQUIN) tablet 500 mg helped her the last time and wants to see if provider will call that in for her again.  [id number] [place], [place] - [address] Phone: [phone]  Fax: [phone]     Please f/u with patient as she has already spoken with the triage nurse on yesterday.

## 2022-07-10 NOTE — Telephone Encounter (Signed)
Prescription sent to [facility]

## 2022-07-15 ENCOUNTER — Ambulatory Visit (INDEPENDENT_AMBULATORY_CARE_PROVIDER_SITE_OTHER): Payer: 59 | Admitting: Family Medicine

## 2022-07-15 VITALS — BP 120/77 | HR 79

## 2022-07-15 DIAGNOSIS — H1132 Conjunctival hemorrhage, left eye: Secondary | ICD-10-CM | POA: Diagnosis not present

## 2022-07-15 DIAGNOSIS — J0111 Acute recurrent frontal sinusitis: Secondary | ICD-10-CM

## 2022-07-15 MED ORDER — AZITHROMYCIN 250 MG PO TABS: ORAL_TABLET | ORAL | 0 refills | Status: AC

## 2022-07-15 NOTE — Progress Notes (Signed)
Virtual Visit via Telephone Note  I connected with Lonia Chimera on 07/15/22 at  1:30 PM EDT by telephone and verified that I am speaking with the correct person using two identifiers.  Location: Patient: At home Provider: [address], [place], [place], [address]    I discussed the limitations, risks, security and privacy concerns of performing an evaluation and management service by telephone and the availability of in person appointments. I also discussed with the patient that there may be a patient responsible charge related to this service. The patient expressed understanding and agreed to proceed.   Shared Decision Making Visit Lung Cancer Screening Program [phone])   Eligibility: Age 57 y.o. Pack Years Smoking History Calculation 20  (# packs/per year x # years smoked) Recent History of coughing up blood  no Unexplained weight loss? no ( >Than 15 pounds within the last 6 months ) Prior History Lung / other cancer no (Diagnosis within the last 5 years already requiring surveillance chest CT Scans). Smoking Status Current Smoker Former Smokers: Years since quit:  NA  Quit Date:  NA  Visit Components: Discussion included one or more decision making aids. yes Discussion included risk/benefits of screening. yes Discussion included potential follow up diagnostic testing for abnormal scans. yes Discussion included meaning and risk of over diagnosis. yes Discussion included meaning and risk of False Positives. yes Discussion included meaning of total radiation exposure. yes  Counseling Included: Importance of adherence to annual lung cancer LDCT screening. yes Impact of comorbidities on ability to participate in the program. yes Ability and willingness to under diagnostic treatment. yes  Smoking Cessation Counseling: Current Smokers:  Discussed importance of smoking cessation. yes Information about tobacco cessation classes and interventions provided to patient.  yes Patient provided with "ticket" for LDCT Scan.  NA Symptomatic Patient. no  Counseling NA Diagnosis Code: Tobacco Use Z72.0 Asymptomatic Patient yes  Counseling (Intermediate counseling: > three minutes counseling) U0454 Former Smokers:  Discussed the importance of maintaining cigarette abstinence. yes Diagnosis Code: Personal History of Nicotine Dependence. U98.119 Information about tobacco cessation classes and interventions provided to patient. Yes Patient provided with "ticket" for LDCT Scan.  NA Written Order for Lung Cancer Screening with LDCT placed in Epic. Yes (CT Chest Lung Cancer Screening Low Dose W/O CM) [id number] Z12.2-Screening of respiratory organs Z87.891-Personal history of nicotine dependence   Bevelyn Ngo, NP

## 2022-07-15 NOTE — Progress Notes (Signed)
I,Sha'taria Tyson,acting as a Neurosurgeon for Mila Merry, MD.,have documented all relevant documentation on the behalf of Mila Merry, MD,as directed by  Mila Merry, MD while in the presence of Mila Merry, MD.   Established patient visit   Patient: Andrea [name]   DOB: [birth year]   57 y.o. Female  MRN: [id number] Visit Date: 07/15/2022  Today's healthcare provider: Mila Merry, MD    Subjective      Cough This is a chronic problem. The current episode started 2 weeks ago. The problem has been unchanged. The problem occurs constantly. The cough is Productive of sputum. Associated symptoms include nasal congestion. Nothing aggravates the symptoms. Risk factors for lung disease include smoking/tobacco exposure. Has also had a lot of pressure in frontal and maxillary sinuses. Has also had painless redness in left eye which she states came from coughing spells a few days ago. She has tried a beta-agonist inhaler, steroid inhaler and prescription cough suppressant for the symptoms.    Medications: Outpatient Medications Prior to Visit  Medication Sig   albuterol (PROVENTIL) (2.5 MG/3ML) 0.083% nebulizer solution USE 1 VIAL IN NEBULIZER EVERY 4 (FOUR) HOURS AS NEEDED FOR WHEEZING OR SHORTNESS OF BREATH.   albuterol (VENTOLIN HFA) 108 (90 Base) MCG/ACT inhaler INHALE 2 PUFFS BY MOUTH EVERY 6 HOURS AS NEEDED FOR WHEEZE OR SHORTNESS OF BREATH   atorvastatin (LIPITOR) 20 MG tablet Take 1 tablet (20 mg total) by mouth daily.   benzonatate (TESSALON) 200 MG capsule Take 1 capsule (200 mg total) by mouth 2 (two) times daily as needed for up to 10 days for cough.   BREZTRI AEROSPHERE 160-9-4.8 MCG/ACT AERO INHALE 2 PUFFS INTO THE LUNGS IN THE MORNING AND AT BEDTIME.   Budeson-Glycopyrrol-Formoterol (BREZTRI AEROSPHERE) 160-9-4.8 MCG/ACT AERO Inhale 2 puffs into the lungs in the morning and at bedtime.   clonazePAM (KLONOPIN) 1 MG tablet Take 1 tablet (1 mg total) by mouth 2 (two) times  daily as needed.   dexlansoprazole (DEXILANT) 60 MG capsule TAKE 1 CAPSULE BY MOUTH EVERY DAY   fluticasone (FLONASE) 50 MCG/ACT nasal spray Place 2 sprays into both nostrils as needed.   hydroxychloroquine (PLAQUENIL) 200 MG tablet TAKE 2 TABLETS (400 MG TOTAL) BY MOUTH DAILY FOR DISCOID LUPUS (L93.0)   levalbuterol (XOPENEX HFA) 45 MCG/ACT inhaler Inhale 2 puffs into the lungs every 4 (four) hours as needed for wheezing.   levofloxacin (LEVAQUIN) 500 MG tablet Take 1 tablet (500 mg total) by mouth daily for 7 days.   methocarbamol (ROBAXIN) 500 MG tablet Take 1 tablet (500 mg total) by mouth every 8 (eight) hours as needed for muscle spasms.   Misc. Devices (PULSE OXIMETER DELUXE) MISC Use daily to monitor oxygen levels   montelukast (SINGULAIR) 10 MG tablet Take 1 tablet (10 mg total) by mouth at bedtime. (Patient taking differently: Take 10 mg by mouth as needed.)   Nebulizers (COMPRESSOR/NEBULIZER) MISC Supplies only   Nebulizers (COMPRESSOR/NEBULIZER) MISC Use as directed   nitroGLYCERIN (NITROSTAT) 0.4 MG SL tablet Place 1 tablet (0.4 mg total) under the tongue every 5 (five) minutes as needed for chest pain.   ondansetron (ZOFRAN) 4 MG tablet Take 1 tablet (4 mg total) by mouth every 8 (eight) hours as needed for nausea or vomiting.   OXYGEN Inhale 1 L into the lungs as needed.   oxymetazoline (AFRIN NASAL SPRAY) 0.05 % nasal spray 2 sprays per nare for acute nasal bleeding/epistaxis   prednisoLONE acetate (PRED FORTE) 1 % ophthalmic suspension  PLACE 1 DROP INTO THE LEFT EYE 4 (FOUR) TIMES DAILY FOR AT LEAST TWO DAYS AND CONTINUE UNTIL SYMPTOMS IMPROVE, ONCE SYMPTOMS IMPROVE PLEASE TAPER DOSING, 3 TIMES DAY, TWICE DAILY, ONCE DAILY. DO NOT TAKE WITH YOUR OTHER EYE DROP -DUREZOL   predniSONE (STERAPRED UNI-PAK 21 TAB) 10 MG (21) TBPK tablet 10 mg--6 day dose pack to take as directed.   Respiratory Therapy Supplies (FLUTTER) DEVI 1 Device by Does not apply route daily.   Spacer/Aero-Holding  Chambers (AEROCHAMBER MV) inhaler Use as instructed   Spacer/Aero-Holding Chambers (AEROCHAMBER MV) inhaler Use as instructed   No facility-administered medications prior to visit.    Review of Systems  Respiratory:  Positive for cough.        Objective    BP 120/77 (BP Location: Left Arm, Patient Position: Sitting, Cuff Size: Normal)   Pulse 79   SpO2 100%    Physical Exam  General Appearance:    Well developed, well nourished female, alert, cooperative, in no acute distress  HENT:   bilateral TM normal without fluid or infection, neck without nodes, throat normal without erythema or exudate, frontal and maxillary sinuses tender, and nasal mucosa congested  Eyes:    PERRL, subconjunctival hemorrhage noted left medial eye, EOM's intact       Lungs:     Clear to auscultation bilaterally, respirations unlabored  Heart:    Normal heart rate. Normal rhythm. No murmurs, rubs, or gallops.    Neurologic:   Awake, alert, oriented x 3. No apparent focal neurological           defect.        Assessment & Plan     1. Acute recurrent frontal sinusitis  - azithromycin (ZITHROMAX) 250 MG tablet; Take 2 tablets on day 1, then 1 tablet daily on days 2 through 5  Dispense: 6 tablet; Refill: 0   2. Subconjunctival hemorrhage of left eye      The entirety of the information documented in the History of Present Illness, Review of Systems and Physical Exam were personally obtained by me. Portions of this information were initially documented by the CMA and reviewed by me for thoroughness and accuracy.     Mila Merry, MD  [facility] [phone] (phone) [phone] (fax)  [facility]

## 2022-07-16 ENCOUNTER — Telehealth: Payer: Self-pay | Admitting: Acute Care

## 2022-07-16 ENCOUNTER — Encounter: Payer: 59 | Admitting: Acute Care

## 2022-07-16 DIAGNOSIS — F17210 Nicotine dependence, cigarettes, uncomplicated: Secondary | ICD-10-CM

## 2022-07-16 NOTE — Telephone Encounter (Signed)
I attempted to call the patient for her scheduled, required  Shared decision making visit prior to her Screening CT Chest on 5/15. There was no answer. I have left a message on her VM requesting she call the office in the next few minutes so we can complete this mandatory visit. I will call again in 10 minutes if I do not hear from her.  If we do not get this visit completed today, we will need to cancel the CT chest and and reschedule both the [facility] and CT .

## 2022-07-16 NOTE — Telephone Encounter (Signed)
I have attempted to call the patient for their shared decision making visit.  There was no answer. I have left a HIPPA compliant VM explaining we will need to cancer her CT scheduled for tomorrow 5/15 until she can complete her shared decision making visit. She will be rescheduled for both at a later time.

## 2022-07-17 ENCOUNTER — Ambulatory Visit: Payer: 59

## 2022-07-22 DIAGNOSIS — J449 Chronic obstructive pulmonary disease, unspecified: Secondary | ICD-10-CM | POA: Diagnosis not present

## 2022-07-23 DIAGNOSIS — J9621 Acute and chronic respiratory failure with hypoxia: Secondary | ICD-10-CM | POA: Diagnosis not present

## 2022-07-31 ENCOUNTER — Encounter: Payer: Self-pay | Admitting: Medical

## 2022-07-31 NOTE — Progress Notes (Signed)
Unable to contact patient to schedule echocardiogram. Letter sent, order cancelled 

## 2022-08-05 ENCOUNTER — Telehealth: Payer: Self-pay

## 2022-08-05 DIAGNOSIS — I10 Essential (primary) hypertension: Secondary | ICD-10-CM

## 2022-08-05 DIAGNOSIS — J449 Chronic obstructive pulmonary disease, unspecified: Secondary | ICD-10-CM

## 2022-08-05 NOTE — Telephone Encounter (Signed)
PharmD reviewed patient chart to assess eligibility for [facility] services. Patient was determined to be a good candidate for the program given the complexity of the medication regimen and/or overall risk for hospitalization and increased utilization.  Referral entered in order to outreach patient and offer appointment with PharmD. Referral cosigned to PCP.  

## 2022-08-10 ENCOUNTER — Other Ambulatory Visit: Payer: Self-pay | Admitting: Family Medicine

## 2022-08-12 ENCOUNTER — Ambulatory Visit: Payer: 59 | Admitting: Surgery

## 2022-08-13 ENCOUNTER — Telehealth: Payer: 59 | Admitting: Nurse Practitioner

## 2022-08-13 DIAGNOSIS — J209 Acute bronchitis, unspecified: Secondary | ICD-10-CM | POA: Diagnosis not present

## 2022-08-13 DIAGNOSIS — J440 Chronic obstructive pulmonary disease with acute lower respiratory infection: Secondary | ICD-10-CM | POA: Diagnosis not present

## 2022-08-13 DIAGNOSIS — J449 Chronic obstructive pulmonary disease, unspecified: Secondary | ICD-10-CM | POA: Diagnosis not present

## 2022-08-13 MED ORDER — PREDNISONE 10 MG PO TABS: ORAL_TABLET | ORAL | 0 refills | Status: DC

## 2022-08-13 NOTE — Progress Notes (Signed)
I have spent 5 minutes in review of e-visit questionnaire, review and updating patient chart, medical decision making and response to patient.  ° °Bain Whichard W Eldred Lievanos, NP ° °  °

## 2022-08-13 NOTE — Progress Notes (Signed)
E-Visit for Cough  We are sorry that you are not feeling well.  Here is how we plan to help!  Based on your presentation I believe you most likely have A cough due to bronchitis.  If your symptoms do not improve with the  treatment plan it is important that you contact your provider.    Since you were prescribed a zpak within the past 30 days you would need to contact your PCP or pulmonologist for another round of antibiotics. I have prescribed today the following:   Prednisone 10 mg daily for 6 days (see taper instructions below)  Directions for 6 day taper: Day 1: 2 tablets before breakfast, 1 after both lunch & dinner and 2 at bedtime Day 2: 1 tab before breakfast, 1 after both lunch & dinner and 2 at bedtime Day 3: 1 tab at each meal & 1 at bedtime Day 4: 1 tab at breakfast, 1 at lunch, 1 at bedtime Day 5: 1 tab at breakfast & 1 tab at bedtime Day 6: 1 tab at breakfast  From your responses in the eVisit questionnaire you describe inflammation in the upper respiratory tract which is causing a significant cough.  This is commonly called Bronchitis and has four common causes:   Allergies Viral Infections Acid Reflux Bacterial Infection Allergies, viruses and acid reflux are treated by controlling symptoms or eliminating the cause. An example might be a cough caused by taking certain blood pressure medications. You stop the cough by changing the medication. Another example might be a cough caused by acid reflux. Controlling the reflux helps control the cough.  USE OF BRONCHODILATOR ("RESCUE") INHALERS: There is a risk from using your bronchodilator too frequently.  The risk is that over-reliance on a medication which only relaxes the muscles surrounding the breathing tubes can reduce the effectiveness of medications prescribed to reduce swelling and congestion of the tubes themselves.  Although you feel brief relief from the bronchodilator inhaler, your asthma may actually be worsening with  the tubes becoming more swollen and filled with mucus.  This can delay other crucial treatments, such as oral steroid medications. If you need to use a bronchodilator inhaler daily, several times per day, you should discuss this with your provider.  There are probably better treatments that could be used to keep your asthma under control.     HOME CARE Only take medications as instructed by your medical team. Complete the entire course of an antibiotic. Drink plenty of fluids and get plenty of rest. Avoid close contacts especially the very young and the elderly Cover your mouth if you cough or cough into your sleeve. Always remember to wash your hands A steam or ultrasonic humidifier can help congestion.   GET HELP RIGHT AWAY IF: You develop worsening fever. You become short of breath You cough up blood. Your symptoms persist after you have completed your treatment plan MAKE SURE YOU  Understand these instructions. Will watch your condition. Will get help right away if you are not doing well or get worse.    Thank you for choosing an e-visit.  Your e-visit answers were reviewed by a board certified advanced clinical practitioner to complete your personal care plan. Depending upon the condition, your plan could have included both over the counter or prescription medications.  Please review your pharmacy choice. Make sure the pharmacy is open so you can pick up prescription now. If there is a problem, you may contact your provider through [employer] and have  the prescription routed to another pharmacy.  Your safety is important to [place]. If you have drug allergies check your prescription carefully.   For the next 24 hours you can use MyChart to ask questions about today's visit, request a non-urgent call back, or ask for a work or school excuse. You will get an email in the next two days asking about your experience. I hope that your e-visit has been valuable and will speed your  recovery.

## 2022-08-21 ENCOUNTER — Ambulatory Visit (INDEPENDENT_AMBULATORY_CARE_PROVIDER_SITE_OTHER): Payer: 59

## 2022-08-21 VITALS — Ht 63.0 in | Wt 143.0 lb

## 2022-08-21 DIAGNOSIS — Z0000 Encounter for general adult medical examination without abnormal findings: Secondary | ICD-10-CM | POA: Diagnosis not present

## 2022-08-21 DIAGNOSIS — Z1231 Encounter for screening mammogram for malignant neoplasm of breast: Secondary | ICD-10-CM

## 2022-08-21 DIAGNOSIS — H269 Unspecified cataract: Secondary | ICD-10-CM

## 2022-08-21 DIAGNOSIS — H259 Unspecified age-related cataract: Secondary | ICD-10-CM

## 2022-08-21 NOTE — Patient Instructions (Signed)
Ms. Blanchet , Thank you for taking time to come for your Medicare Wellness Visit. I appreciate your ongoing commitment to your health goals. Please review the following plan we discussed and let me know if I can assist you in the future.   These are the goals we discussed:  Goals      DIET - EAT MORE FRUITS AND VEGETABLES     Prevent falls     Recommend to remove any items from the home that may cause slips or trips.        This is a list of the screening recommended for you and due dates:  Health Maintenance  Topic Date Due   DTaP/Tdap/Td vaccine (1 - Tdap) Never done   Pap Smear  Never done   Zoster (Shingles) Vaccine (1 of 2) Never done   Mammogram  11/26/2018   Screening for Lung Cancer  01/09/2021   COVID-19 Vaccine (3 - 2023-24 season) 11/02/2021   Flu Shot  10/03/2022   Medicare Annual Wellness Visit  08/21/2023   Colon Cancer Screening  10/04/2024   Hepatitis C Screening  Completed   HIV Screening  Completed   HPV Vaccine  Aged Out    Advanced directives: yes  Conditions/risks identified: low falls risk  Next appointment: Follow up in one year for your annual wellness visit. 08/25/2023 @ 9:15am telephone  Preventive Care 59-64 Years, Female Preventive care refers to lifestyle choices and visits with your health care provider that can promote health and wellness. What does preventive care include? A yearly physical exam. This is also called an annual well check. Dental exams once or twice a year. Routine eye exams. Ask your health care provider how often you should have your eyes checked. Personal lifestyle choices, including: Daily care of your teeth and gums. Regular physical activity. Eating a healthy diet. Avoiding tobacco and drug use. Limiting alcohol use. Practicing safe sex. Taking low-dose aspirin daily starting at age 59. Taking vitamin and mineral supplements as recommended by your health care provider. What happens during an annual well  check? The services and screenings done by your health care provider during your annual well check will depend on your age, overall health, lifestyle risk factors, and family history of disease. Counseling  Your health care provider may ask you questions about your: Alcohol use. Tobacco use. Drug use. Emotional well-being. Home and relationship well-being. Sexual activity. Eating habits. Work and work Astron[employer]. Method of birth control. Menstrual cycle. Pregnancy history. Screening  You may have the following tests or measurements: Height, weight, and BMI. Blood pressure. Lipid and cholesterol levels. These may be checked every 5 years, or more frequently if you are over 75 years old. Skin check. Lung cancer screening. You may have this screening every year starting at age 59 if you have a 30-pack-year history of smoking and currently smoke or have quit within the past 15 years. Fecal occult blood test (FOBT) of the stool. You may have this test every year starting at age 59. Flexible sigmoidoscopy or colonoscopy. You may have a sigmoidoscopy every 5 years or a colonoscopy every 10 years starting at age 59. Hepatitis C blood test. Hepatitis B blood test. Sexually transmitted disease (STD) testing. Diabetes screening. This is done by checking your blood sugar (glucose) after you have not eaten for a while (fasting). You may have this done every 1-3 years. Mammogram. This may be done every 1-2 years. Talk to your health care provider about when you should start having  regular mammograms. This may depend on whether you have a family history of breast cancer. BRCA-related cancer screening. This may be done if you have a family history of breast, ovarian, tubal, or peritoneal cancers. Pelvic exam and Pap test. This may be done every 3 years starting at age 59. Starting at age 67, this may be done every 5 years if you have a Pap test in combination with an HPV test. Bone density scan. This  is done to screen for osteoporosis. You may have this scan if you are at high risk for osteoporosis. Discuss your test results, treatment options, and if necessary, the need for more tests with your health care provider. Vaccines  Your health care provider may recommend certain vaccines, such as: Influenza vaccine. This is recommended every year. Tetanus, diphtheria, and acellular pertussis (Tdap, Td) vaccine. You may need a Td booster every 10 years. Zoster vaccine. You may need this after age 65. Pneumococcal 13-valent conjugate (PCV13) vaccine. You may need this if you have certain conditions and were not previously vaccinated. Pneumococcal polysaccharide (PPSV23) vaccine. You may need one or two doses if you smoke cigarettes or if you have certain conditions. Talk to your health care provider about which screenings and vaccines you need and how often you need them. This information is not intended to replace advice given to you by your health care provider. Make sure you discuss any questions you have with your health care provider. Document Released: 03/17/2015 Document Revised: 11/08/2015 Document Reviewed: 12/20/2014 Elsevier Interactive Patient Education  2017 [place] Prevention in the Home Falls can cause injuries. They can happen to people of all ages. There are many things you can do to make your home safe and to help prevent falls. What can I do on the outside of my home? Regularly fix the edges of walkways and driveways and fix any cracks. Remove anything that might make you trip as you walk through a door, such as a raised step or threshold. Trim any bushes or trees on the path to your home. Use bright outdoor lighting. Clear any walking paths of anything that might make someone trip, such as rocks or tools. Regularly check to see if handrails are loose or broken. Make sure that both sides of any steps have handrails. Any raised decks and porches should have  guardrails on the edges. Have any leaves, snow, or ice cleared regularly. Use sand or salt on walking paths during winter. Clean up any spills in your garage right away. This includes oil or grease spills. What can I do in the bathroom? Use night lights. Install grab bars by the toilet and in the tub and shower. Do not use towel bars as grab bars. Use non-skid mats or decals in the tub or shower. If you need to sit down in the shower, use a plastic, non-slip stool. Keep the floor dry. Clean up any water that spills on the floor as soon as it happens. Remove soap buildup in the tub or shower regularly. Attach bath mats securely with double-sided non-slip rug tape. Do not have throw rugs and other things on the floor that can make you trip. What can I do in the bedroom? Use night lights. Make sure that you have a light by your bed that is easy to reach. Do not use any sheets or blankets that are too big for your bed. They should not hang down onto the floor. Have a firm chair that has  side arms. You can use this for support while you get dressed. Do not have throw rugs and other things on the floor that can make you trip. What can I do in the kitchen? Clean up any spills right away. Avoid walking on wet floors. Keep items that you use a lot in easy-to-reach places. If you need to reach something above you, use a strong step stool that has a grab bar. Keep electrical cords out of the way. Do not use floor polish or wax that makes floors slippery. If you must use wax, use non-skid floor wax. Do not have throw rugs and other things on the floor that can make you trip. What can I do with my stairs? Do not leave any items on the stairs. Make sure that there are handrails on both sides of the stairs and use them. Fix handrails that are broken or loose. Make sure that handrails are as long as the stairways. Check any carpeting to make sure that it is firmly attached to the stairs. Fix any carpet  that is loose or worn. Avoid having throw rugs at the top or bottom of the stairs. If you do have throw rugs, attach them to the floor with carpet tape. Make sure that you have a light switch at the top of the stairs and the bottom of the stairs. If you do not have them, ask someone to add them for you. What else can I do to help prevent falls? Wear shoes that: Do not have high heels. Have rubber bottoms. Are comfortable and fit you well. Are closed at the toe. Do not wear sandals. If you use a stepladder: Make sure that it is fully opened. Do not climb a closed stepladder. Make sure that both sides of the stepladder are locked into place. Ask someone to hold it for you, if possible. Clearly mark and make sure that you can see: Any grab bars or handrails. First and last steps. Where the edge of each step is. Use tools that help you move around (mobility aids) if they are needed. These include: Canes. Walkers. Scooters. Crutches. Turn on the lights when you go into a dark area. Replace any light bulbs as soon as they burn out. Set up your furniture so you have a clear path. Avoid moving your furniture around. If any of your floors are uneven, fix them. If there are any pets around you, be aware of where they are. Review your medicines with your doctor. Some medicines can make you feel dizzy. This can increase your chance of falling. Ask your doctor what other things that you can do to help prevent falls. This information is not intended to replace advice given to you by your health care provider. Make sure you discuss any questions you have with your health care provider. Document Released: 12/15/2008 Document Revised: 07/27/2015 Document Reviewed: 03/25/2014 Elsevier Interactive Patient Education  2017 [employer].

## 2022-08-21 NOTE — Progress Notes (Signed)
Subjective:   Andrea [name] is a 57 y.o. female who presents for Medicare Annual (Subsequent) preventive examination.  Visit Complete: Virtual  I connected with  Andrea [name] on 08/21/22 by a audio enabled telemedicine application and verified that I am speaking with the correct person using two identifiers.  Patient Location: Home  Provider Location: Office/Clinic  I discussed the limitations of evaluation and management by telemedicine. The patient expressed understanding and agreed to proceed.  Patient Medicare AWV questionnaire was completed by the patient on 9NOT DONE); I have confirmed that all information answered by patient is correct and no changes since this date.  Review of Systems    Cardiac Risk Factors include: hypertension;dyslipidemia;sedentary lifestyle;smoking/ tobacco exposure    Objective:    Today's Vitals   08/21/22 0836  Weight: 143 lb (64.9 kg)  Height: 5\' 3"  (1.6 m)  PainSc: 4    Body mass index is 25.33 kg/m.     08/21/2022    8:52 AM 03/28/2022   10:03 AM 03/19/2022    2:52 PM 02/21/2022    7:26 AM 07/13/2021    5:14 PM 05/21/2021    2:08 PM 05/20/2021    8:45 AM  Advanced Directives  Does Patient Have a Medical Advance Directive? Yes Yes Yes Yes No No No  Type of Estate agent of [place];Living will Living will;Healthcare Power of [employer] Power of [place];Living will Healthcare Power of [place];Living will     Does patient want to make changes to medical advance directive?  No - Patient declined No - Patient declined      Copy of Healthcare Power of Attorney in Chart?  No - copy requested No - copy requested      Would patient like information on creating a medical advance directive?  No - Patient declined         Current Medications (verified) Outpatient Encounter Medications as of 08/21/2022  Medication Sig   albuterol (PROVENTIL) (2.5 MG/3ML) 0.083% nebulizer solution USE 1 VIAL IN NEBULIZER EVERY 4  (FOUR) HOURS AS NEEDED FOR WHEEZING OR SHORTNESS OF BREATH.   albuterol (VENTOLIN HFA) 108 (90 Base) MCG/ACT inhaler INHALE 2 PUFFS BY MOUTH EVERY 6 HOURS AS NEEDED FOR WHEEZE OR SHORTNESS OF BREATH   atorvastatin (LIPITOR) 20 MG tablet Take 1 tablet (20 mg total) by mouth daily.   BREZTRI AEROSPHERE 160-9-4.8 MCG/ACT AERO INHALE 2 PUFFS INTO THE LUNGS IN THE MORNING AND AT BEDTIME.   Budeson-Glycopyrrol-Formoterol (BREZTRI AEROSPHERE) 160-9-4.8 MCG/ACT AERO Inhale 2 puffs into the lungs in the morning and at bedtime.   clonazePAM (KLONOPIN) 1 MG tablet TAKE 1 TABLET BY MOUTH 2 TIMES DAILY AS NEEDED. DNF UNTIL 11/3   dexlansoprazole (DEXILANT) 60 MG capsule TAKE 1 CAPSULE BY MOUTH EVERY DAY   fluticasone (FLONASE) 50 MCG/ACT nasal spray Place 2 sprays into both nostrils as needed.   hydroxychloroquine (PLAQUENIL) 200 MG tablet TAKE 2 TABLETS (400 MG TOTAL) BY MOUTH DAILY FOR DISCOID LUPUS (L93.0)   levalbuterol (XOPENEX HFA) 45 MCG/ACT inhaler Inhale 2 puffs into the lungs every 4 (four) hours as needed for wheezing.   methocarbamol (ROBAXIN) 500 MG tablet Take 1 tablet (500 mg total) by mouth every 8 (eight) hours as needed for muscle spasms.   Misc. Devices (PULSE OXIMETER DELUXE) MISC Use daily to monitor oxygen levels   montelukast (SINGULAIR) 10 MG tablet Take 1 tablet (10 mg total) by mouth at bedtime. (Patient taking differently: Take 10 mg by mouth as needed.)  Nebulizers (COMPRESSOR/NEBULIZER) MISC Supplies only   Nebulizers (COMPRESSOR/NEBULIZER) MISC Use as directed   nitroGLYCERIN (NITROSTAT) 0.4 MG SL tablet Place 1 tablet (0.4 mg total) under the tongue every 5 (five) minutes as needed for chest pain.   OXYGEN Inhale 1 L into the lungs as needed.   oxymetazoline (AFRIN NASAL SPRAY) 0.05 % nasal spray 2 sprays per nare for acute nasal bleeding/epistaxis   prednisoLONE acetate (PRED FORTE) 1 % ophthalmic suspension PLACE 1 DROP INTO THE LEFT EYE 4 (FOUR) TIMES DAILY FOR AT LEAST TWO  DAYS AND CONTINUE UNTIL SYMPTOMS IMPROVE, ONCE SYMPTOMS IMPROVE PLEASE TAPER DOSING, 3 TIMES DAY, TWICE DAILY, ONCE DAILY. DO NOT TAKE WITH YOUR OTHER EYE DROP -DUREZOL   predniSONE (DELTASONE) 10 MG tablet Directions for 6 day taper: Day 1: 2 tablets before breakfast, 1 after both lunch & dinner and 2 at bedtime Day 2: 1 tab before breakfast, 1 after both lunch & dinner and 2 at bedtime Day 3: 1 tab at each meal & 1 at bedtime Day 4: 1 tab at breakfast, 1 at lunch, 1 at bedtime Day 5: 1 tab at breakfast & 1 tab at bedtime Day 6: 1 tab at breakfast   Respiratory Therapy Supplies (FLUTTER) DEVI 1 Device by Does not apply route daily.   Spacer/Aero-Holding Chambers (AEROCHAMBER MV) inhaler Use as instructed   Spacer/Aero-Holding Chambers (AEROCHAMBER MV) inhaler Use as instructed   ondansetron (ZOFRAN) 4 MG tablet Take 1 tablet (4 mg total) by mouth every 8 (eight) hours as needed for nausea or vomiting. (Patient not taking: Reported on 08/21/2022)   No facility-administered encounter medications on file as of 08/21/2022.    Allergies (verified) Alum & mag hydroxide-simeth, Bactrim [sulfamethoxazole-trimethoprim], Cefdinir, Chantix [varenicline], Chlorhexidine gluconate, Doxycycline, Multaq [dronedarone], and Ivp dye [iodinated contrast media]   History: Past Medical History:  Diagnosis Date   Anxiety    panic attacks, chest pain   Arthritis    COPD (chronic obstructive pulmonary disease) (HCC)    COVID    2023   Dyspnea    GERD (gastroesophageal reflux disease)    History of hiatal hernia    Hypertension    not medicated   Non-obstructive CAD in native artery    a. cardiac cath 01/2014: showed minor irregularities, mildly elevated left ventricular end-diastolic pressure and normal ejection fraction; b. 03/2015 MV: low risk.   Palpitations    a. 08/2016 Event Monitor: occas PVC's, two brief runs of SVT (4 and 7 beats).   PONV (postoperative nausea and vomiting)    Symptomatic PVC's  (premature ventricular contractions)    Tobacco abuse    Wears dentures    partial upper and lower   Past Surgical History:  Procedure Laterality Date   CARDIAC CATHETERIZATION  01/03/14   CARDIAC CATHETERIZATION     CHOLECYSTECTOMY     COLONOSCOPY WITH PROPOFOL N/A 10/05/2019   Procedure: COLONOSCOPY WITH PROPOFOL;  Surgeon: Midge Minium, MD;  Location: [facility];  Service: Endoscopy;  Laterality: N/A;   ESOPHAGOGASTRODUODENOSCOPY (EGD) WITH PROPOFOL N/A 07/11/2016   Procedure: ESOPHAGOGASTRODUODENOSCOPY (EGD) WITH PROPOFOL;  Surgeon: Midge Minium, MD;  Location: [facility];  Service: Endoscopy;  Laterality: N/A;   ESOPHAGOGASTRODUODENOSCOPY (EGD) WITH PROPOFOL N/A 10/05/2019   Procedure: ESOPHAGOGASTRODUODENOSCOPY (EGD) WITH PROPOFOL;  Surgeon: Midge Minium, MD;  Location: [facility];  Service: Endoscopy;  Laterality: N/A;   ESOPHAGOGASTRODUODENOSCOPY (EGD) WITH PROPOFOL N/A 02/21/2022   Procedure: ESOPHAGOGASTRODUODENOSCOPY (EGD) WITH PROPOFOL;  Surgeon: Midge Minium, MD;  Location: [facility];  Service:  Endoscopy;  Laterality: N/A;   EYE SURGERY     IMAGE GUIDED SINUS SURGERY N/A 11/30/2014   Procedure: IMAGE GUIDED SINUS SURGERY;  Surgeon: Bud Face, MD;  Location: [facility];  Service: ENT;  Laterality: N/A;  GAVE DISK TO CE CE   INSERTION OF MESH  03/28/2022   Procedure: INSERTION OF MESH;  Surgeon: Leafy Ro, MD;  Location: [facility];  Service: General;;   MAXILLARY ANTROSTOMY Bilateral 11/30/2014   Procedure: MAXILLARY ANTROSTOMY;  Surgeon: Bud Face, MD;  Location: [facility];  Service: ENT;  Laterality: Bilateral;   SEPTOPLASTY N/A 11/30/2014   Procedure: SEPTOPLASTY;  Surgeon: Bud Face, MD;  Location: [facility];  Service: ENT;  Laterality: N/A;   SPHENOIDECTOMY Left 11/30/2014   Procedure: Selina Cooley, ;  Surgeon: Bud Face, MD;  Location: [facility];  Service: ENT;  Laterality: Left;    VAGINAL HYSTERECTOMY  1995   vaginal   XI ROBOTIC ASSISTED PARAESOPHAGEAL HERNIA REPAIR N/A 03/28/2022   Procedure: XI ROBOTIC ASSISTED PARAESOPHAGEAL HERNIA REPAIR, RNFA to assist;  Surgeon: Leafy Ro, MD;  Location: [facility];  Service: General;  Laterality: N/A;   Family History  Problem Relation Age of Onset   Emphysema Father    Asthma Father    Heart disease Father    Heart attack Father    Arthritis Father        RA   Colon cancer Father    Hypertension Mother    Breast cancer Neg Hx    Social History   Socioeconomic History   Marital status: Married    Spouse name: Not on file   Number of children: 3   Years of education: Not on file   Highest education level: GED or equivalent  Occupational History   Occupation: Disabled    Comment: Social Security as of 05/21/2011  Tobacco Use   Smoking status: Every Day    Packs/day: 1.00    Years: 20.00    Additional pack years: 0.00    Total pack years: 20.00    Types: Cigarettes    Passive exposure: Past   Smokeless tobacco: Never   Tobacco comments:    0.5 PPD -06/06/2022  khj  Vaping Use   Vaping Use: Never used  Substance and Sexual Activity   Alcohol use: No    Alcohol/week: 0.0 standard drinks of alcohol   Drug use: No   Sexual activity: Not Currently  Other Topics Concern   Not on file  Social History Narrative   Lives at home with husband. Independent at baseline.   Social Determinants of Health   Financial Resource Strain: Medium Risk (08/21/2022)   Overall Financial Resource Strain (CARDIA)    Difficulty of Paying Living Expenses: Somewhat hard  Food Insecurity: No Food Insecurity (08/21/2022)   Hunger Vital Sign    Worried About Running Out of Food in the Last Year: Never true    Ran Out of Food in the Last Year: Never true  Transportation Needs: No Transportation Needs (08/21/2022)   PRAPARE - Administrator, Civil Service (Medical): No    Lack of Transportation (Non-Medical): No   Physical Activity: Inactive (08/21/2022)   Exercise Vital Sign    Days of Exercise per Week: 0 days    Minutes of Exercise per Session: 0 min  Stress: Stress Concern Present (08/21/2022)   [employer] of Occupational Health - Occupational Stress Questionnaire    Feeling of Stress : To some extent  Social Connections: Moderately Isolated (04/12/2021)   Social Connection and Isolation Panel [NHANES]    Frequency of Communication with Friends and Family: More than three times a week    Frequency of Social Gatherings with Friends and Family: Once a week    Attends Religious Services: Never    Database administrator or Organizations: No    Attends Banker Meetings: Never    Marital Status: Married    Tobacco Counseling Ready to quit: No Counseling given: Not Answered Tobacco comments: 0.5 PPD -06/06/2022  khj   Clinical Intake:  Pre-visit preparation completed: Yes  Pain : 0-10 Pain Score: 4  Pain Type: Chronic pain Pain Location: Back Pain Orientation: Lower Pain Descriptors / Indicators: Aching (left hip) Pain Onset: More than a month ago Pain Frequency: Intermittent Pain Relieving Factors: chiropractor  Pain Relieving Factors: chiropractor  BMI - recorded: 25.33 Nutritional Status: BMI 25 -29 Overweight Nutritional Risks: None Diabetes: No  How often do you need to have someone help you when you read instructions, pamphlets, or other written materials from your doctor or pharmacy?: 1 - Never  Interpreter Needed?: No  Comments: daughter lives with her Information entered by :: B.Mahina Salatino,LPN   Activities of Daily Living    08/21/2022    8:53 AM 06/17/2022    1:29 PM  In your present state of health, do you have any difficulty performing the following activities:  Hearing? 0 1  Vision? 1 1  Difficulty concentrating or making decisions? 0 0  Walking or climbing stairs? 1 1  Dressing or bathing? 0 0  Doing errands, shopping? 1 0  Preparing Food  and eating ? N   Using the Toilet? N   In the past six months, have you accidently leaked urine? N   Do you have problems with loss of bowel control? N   Managing your Medications? N   Managing your Finances? N   Housekeeping or managing your Housekeeping? Y     Patient Care Team: Malva Limes, MD as PCP - General (Family Medicine) Salena Saner, MD as Consulting Physician (Pulmonary Disease)  Indicate any recent Medical Services you may have received from other than [facility] providers in the past year (date may be approximate).     Assessment:   This is a routine wellness examination for Otha.  Hearing/Vision screen Hearing Screening - Comments:: Adequate hearing Vision Screening - Comments:: Adequate vision with glasses;rt eye  Cataract moving onto pupil-left eye [facility]  Dietary issues and exercise activities discussed:     Goals Addressed             This Visit's Progress    DIET - EAT MORE FRUITS AND VEGETABLES   Not on track    Prevent falls   On track    Recommend to remove any items from the home that may cause slips or trips.       Depression Screen    08/21/2022    8:46 AM 06/17/2022    1:28 PM 01/09/2022    9:16 AM 05/14/2021   11:40 AM 04/12/2021    9:47 AM 01/17/2020    4:15 PM 08/11/2019    2:42 PM  PHQ 2/9 Scores  PHQ - 2 Score 2 3 1 1  0 2 1  PHQ- 9 Score 10 12 8 12  12      Fall Risk    08/21/2022    8:42 AM 06/17/2022    1:28 PM 01/09/2022  9:16 AM 04/12/2021    9:49 AM 08/11/2019    2:47 PM  Fall Risk   Falls in the past year? 0 0 0 0 1  Number falls in past yr: 0 0 0 0 0  Injury with Fall? 0 0 0 0 0  Risk for fall due to : No Fall Risks  No Fall Risks No Fall Risks   Follow up Falls prevention discussed;Education provided  Falls evaluation completed Falls evaluation completed Falls prevention discussed    MEDICARE RISK AT HOME:  Medicare Risk at Home - 08/21/22 0842     Any stairs in or around the home? No    If so, are there  any without handrails? No    Home free of loose throw rugs in walkways, pet beds, electrical cords, etc? Yes    Adequate lighting in your home to reduce risk of falls? Yes    Life alert? No    Use of a cane, walker or w/c? No    Grab bars in the bathroom? No    Shower chair or bench in shower? No    Elevated toilet seat or a handicapped toilet? No             TIMED UP AND GO:  Was the test performed?  No    Cognitive Function:        08/21/2022    8:57 AM  6CIT Screen  What Year? 0 points  What month? 0 points  What time? 0 points  Count back from 20 0 points  Months in reverse 0 points  Repeat phrase 2 points  Total Score 2 points    Immunizations Immunization History  Administered Date(s) Administered   Moderna Sars-Covid-2 Vaccination 11/28/2019, 12/26/2019   Pneumococcal Conjugate-13 11/20/2012   Pneumococcal Polysaccharide-23 08/10/2012    TDAP status: Up to date  Flu Vaccine status: Declined, Education has been provided regarding the importance of this vaccine but patient still declined. Advised may receive this vaccine at local pharmacy or Health Dept. Aware to provide a copy of the vaccination record if obtained from local pharmacy or Health Dept. Verbalized acceptance and understanding.  Pneumococcal vaccine status: Up to date  Covid-19 vaccine status: Completed vaccines  Qualifies for Shingles Vaccine? Yes   Zostavax completed No   Shingrix Completed?: No.    Education has been provided regarding the importance of this vaccine. Patient has been advised to call insurance company to determine out of pocket expense if they have not yet received this vaccine. Advised may also receive vaccine at local pharmacy or Health Dept. Verbalized acceptance and understanding.  Screening Tests Health Maintenance  Topic Date Due   DTaP/Tdap/Td (1 - Tdap) Never done   PAP SMEAR-Modifier  Never done   Zoster Vaccines- Shingrix (1 of 2) Never done   MAMMOGRAM   11/26/2018   Lung Cancer Screening  01/09/2021   COVID-19 Vaccine (3 - 2023-24 season) 11/02/2021   INFLUENZA VACCINE  10/03/2022   Medicare Annual Wellness (AWV)  08/21/2023   Colonoscopy  10/04/2024   Hepatitis C Screening  Completed   HIV Screening  Completed   HPV VACCINES  Aged Out    Health Maintenance  Health Maintenance Due  Topic Date Due   DTaP/Tdap/Td (1 - Tdap) Never done   PAP SMEAR-Modifier  Never done   Zoster Vaccines- Shingrix (1 of 2) Never done   MAMMOGRAM  11/26/2018   Lung Cancer Screening  01/09/2021   COVID-19 Vaccine (3 - 2023-24  season) 11/02/2021    Colorectal cancer screening: Type of screening: Colonoscopy. Completed yes. Repeat every 5 years  Mammogram status: Ordered yes. Pt provided with contact info and advised to call to schedule appt.   Lung Cancer Screening: (Low Dose CT Chest recommended if Age 60-80 years, 20 pack-year currently smoking OR have quit w/in 15years.) does qualify.   Lung Cancer Screening Referral: no pt do next year  Additional Screening:  Hepatitis C Screening: does not qualify; Completed yes  Vision Screening: Recommended annual ophthalmology exams for early detection of glaucoma and other disorders of the eye. Is the patient up to date with their annual eye exam?  Yes  Who is the provider or what is the name of the office in which the patient attends annual eye exams? [facility] If pt is not established with a provider, would they like to be referred to a provider to establish care? No .   Dental Screening: Recommended annual dental exams for proper oral hygiene  Diabetic Foot Exam: n/a  Community Resource Referral / Chronic Care Management: CRR required this visit?  No   CCM required this visit?  No    Plan:     I have personally reviewed and noted the following in the patient's chart:   Medical and social history Use of alcohol, tobacco or illicit drugs  Current medications and supplements including  opioid prescriptions. Patient is not currently taking opioid prescriptions. Functional ability and status Nutritional status Physical activity Advanced directives List of other physicians Hospitalizations, surgeries, and ER visits in previous 12 months Vitals Screenings to include cognitive, depression, and falls Referrals and appointments  In addition, I have reviewed and discussed with patient certain preventive protocols, quality metrics, and best practice recommendations. A written personalized care plan for preventive services as well as general preventive health recommendations were provided to patient.    Sue Lush, LPN   1/61/0960   After Visit Summary: (MyChart) Due to this being a telephonic visit, the after visit summary with patients personalized plan was offered to patient via MyChart   Nurse Notes: Pt wants to discuss sleep problems and low iron levels at her upcoming visit in July. Pt relays she does not sleep well most nights and she has zero energy and she feels is related to low iron levels. Pt also relays her eye doctor at [facility] told her she has a cataract that in moving into her pupil on her left eye.  *MMG ordered *Referral (marked urgent) for cataract on left eye made to [facility] (provider eye per pt request).

## 2022-08-23 DIAGNOSIS — J9621 Acute and chronic respiratory failure with hypoxia: Secondary | ICD-10-CM | POA: Diagnosis not present

## 2022-08-25 ENCOUNTER — Other Ambulatory Visit: Payer: Self-pay | Admitting: Gastroenterology

## 2022-08-28 ENCOUNTER — Encounter: Payer: Self-pay | Admitting: Pharmacist

## 2022-08-28 NOTE — Progress Notes (Signed)
Patient previously followed by [facility] pharmacist. Per clinical review, no pharmacist appointment needed at this time. Care guide directed to contact patient and cancel appointment and notify pharmacy team of any patient concerns.  

## 2022-09-03 DIAGNOSIS — J449 Chronic obstructive pulmonary disease, unspecified: Secondary | ICD-10-CM | POA: Diagnosis not present

## 2022-09-07 ENCOUNTER — Other Ambulatory Visit: Payer: Self-pay | Admitting: Family Medicine

## 2022-09-09 NOTE — Telephone Encounter (Signed)
Requested Prescriptions  Pending Prescriptions Disp Refills   albuterol (VENTOLIN HFA) 108 (90 Base) MCG/ACT inhaler [Pharmacy Med Name: ALBUTEROL HFA (PROAIR) INHALER] 8.5 each 4    Sig: INHALE 2 PUFFS BY MOUTH EVERY 6 HOURS AS NEEDED FOR WHEEZE OR SHORTNESS OF BREATH     Pulmonology:  Beta Agonists 2 Passed - 09/07/2022  6:53 PM      Passed - Last BP in normal range    BP Readings from Last 1 Encounters:  07/15/22 120/77         Passed - Last Heart Rate in normal range    Pulse Readings from Last 1 Encounters:  07/15/22 79         Passed - Valid encounter within last 12 months    Recent Outpatient Visits           1 month ago Acute recurrent frontal sinusitis   [place] [facility] Malva Limes, MD   2 months ago Iron deficiency   [facility] Malva Limes, MD   3 months ago Other fatigue   [place] [facility] Malva Limes, MD   4 months ago COPD with acute exacerbation [facility])   [place] [facility] Merita Norton T, FNP   6 months ago COPD exacerbation [facility])   [facility] Luis Llorons Torres, [place], [place]

## 2022-09-16 DIAGNOSIS — Z79899 Other long term (current) drug therapy: Secondary | ICD-10-CM | POA: Diagnosis not present

## 2022-09-16 DIAGNOSIS — H2000 Unspecified acute and subacute iridocyclitis: Secondary | ICD-10-CM | POA: Diagnosis not present

## 2022-09-16 DIAGNOSIS — M329 Systemic lupus erythematosus, unspecified: Secondary | ICD-10-CM | POA: Diagnosis not present

## 2022-09-16 DIAGNOSIS — H25043 Posterior subcapsular polar age-related cataract, bilateral: Secondary | ICD-10-CM | POA: Diagnosis not present

## 2022-09-17 ENCOUNTER — Encounter: Payer: Self-pay | Admitting: Pulmonary Disease

## 2022-09-17 ENCOUNTER — Other Ambulatory Visit: Payer: Self-pay

## 2022-09-17 MED ORDER — BREZTRI AEROSPHERE 160-9-4.8 MCG/ACT IN AERO: 2.0000 | INHALATION_SPRAY | Freq: Two times a day (BID) | RESPIRATORY_TRACT | 5 refills | Status: DC

## 2022-09-18 ENCOUNTER — Ambulatory Visit: Payer: Self-pay | Admitting: *Deleted

## 2022-09-18 MED ORDER — FLUCONAZOLE 150 MG PO TABS: 150.0000 mg | ORAL_TABLET | Freq: Once | ORAL | 1 refills | Status: AC

## 2022-09-18 NOTE — Telephone Encounter (Signed)
  Chief Complaint: patient believes she has yeast infection from chronic antibiotic use for COPD. Symptoms: vaginal itching/burning Frequency: started 3 days ago Pertinent Negatives: Patient denies discharge, pain Disposition: [] ED /[] Urgent Care (no appt availability in office) / [] Appointment(In office/virtual)/ []  [place] [facility]/ [] Home Care/ [x] Refused Recommended Disposition /[] [place] [facility]/ []  Follow-up with PCP Additional Notes: Offered patient an appointment- but she declines- patient feels she knowstis is yeast and is requesting Diflucan treatment.

## 2022-09-18 NOTE — Telephone Encounter (Signed)
Summary: vaginal itch/burning   Patient called stated she has a vaginal itch and burning for 2 day. She is asking for something to be called in for relief. Please f/u with patient         Reason for Disposition  MODERATE-SEVERE itching (i.e., interferes with school, work, or sleep)  Answer Assessment - Initial Assessment Questions 1. SYMPTOM: "What's the main symptom you're concerned about?" (e.g., pain, itching, dryness)     Itching and burning 2. LOCATION: "Where is the  itching located?" (e.g., inside/outside, left/right)     Outer edge 3. ONSET: "When did the  itching  start?"     3 days ago 4. PAIN: "Is there any pain?" If Yes, ask: "How bad is it?" (Scale: 1-10; mild, moderate, severe)   -  MILD (1-3): Doesn't interfere with normal activities.    -  MODERATE (4-7): Interferes with normal activities (e.g., work or school) or awakens from sleep.     -  SEVERE (8-10): Excruciating pain, unable to do any normal activities.     no 5. ITCHING: "Is there any itching?" If Yes, ask: "How bad is it?" (Scale: 1-10; mild, moderate, severe)     Yes, 6- moderate 6. CAUSE: "What do you think is causing the discharge?" "Have you had the same problem before? What happened then?"     Yeast 7. OTHER SYMPTOMS: "Do you have any other symptoms?" (e.g., fever, itching, vaginal bleeding, pain with urination, injury to genital area, vaginal foreign body)     no  Protocols used: Vaginal Symptoms-A-AH

## 2022-09-22 DIAGNOSIS — J9621 Acute and chronic respiratory failure with hypoxia: Secondary | ICD-10-CM | POA: Diagnosis not present

## 2022-09-23 ENCOUNTER — Ambulatory Visit: Payer: 59 | Admitting: Family Medicine

## 2022-09-24 DIAGNOSIS — J449 Chronic obstructive pulmonary disease, unspecified: Secondary | ICD-10-CM | POA: Diagnosis not present

## 2022-09-30 ENCOUNTER — Ambulatory Visit: Payer: 59

## 2022-10-09 ENCOUNTER — Encounter: Payer: Self-pay | Admitting: Pulmonary Disease

## 2022-10-09 ENCOUNTER — Ambulatory Visit: Payer: 59 | Admitting: Pulmonary Disease

## 2022-10-09 VITALS — BP 110/60 | HR 65 | Temp 97.6°F | Ht 63.0 in | Wt 139.4 lb

## 2022-10-09 DIAGNOSIS — K219 Gastro-esophageal reflux disease without esophagitis: Secondary | ICD-10-CM

## 2022-10-09 DIAGNOSIS — F17210 Nicotine dependence, cigarettes, uncomplicated: Secondary | ICD-10-CM

## 2022-10-09 DIAGNOSIS — J449 Chronic obstructive pulmonary disease, unspecified: Secondary | ICD-10-CM | POA: Diagnosis not present

## 2022-10-09 MED ORDER — BREZTRI AEROSPHERE 160-9-4.8 MCG/ACT IN AERO: 2.0000 | INHALATION_SPRAY | Freq: Two times a day (BID) | RESPIRATORY_TRACT | 0 refills | Status: DC

## 2022-10-09 NOTE — Patient Instructions (Signed)
We have ordered another swallow test to check on the status of the repair that was done for your reflux.  Make sure that you make an appointment with Dr. Everlene Farrier follow-up on the issue with the hernia.  Call to reschedule your lung cancer screening CT.  Continue Breztri 2 puffs twice a day.  Continue your as needed albuterol.  We will see you in follow-up in 3 to 4 months time call sooner should any new problems arise.

## 2022-10-09 NOTE — Progress Notes (Signed)
Subjective:    Patient ID: Andrea [name], female    DOB: 1965-05-24, 57 y.o.   MRN: [id number]  Patient Care Team: Malva Limes, MD as PCP - General (Family Medicine) Salena Saner, MD as Consulting Physician (Pulmonary Disease)  Chief Complaint  Patient presents with   Follow-up    No SOB, wheezing or cough.     HPI Andrea [name] is a 57 year old current smoker (half PPD, 20 PY) with stage II COPD who presents for follow-up on the same.  This is a scheduled visit.  Since her last visit here on 06 June 2022 at that time she had undergone paraesophageal hernia repair and antireflux surgery by Dr. Everlene Farrier on 28 March 2022.  She had been doing really well postoperatively and had not had issues with her COPD since her severe reflux was controlled.  She returns today stating that she had been doing well after her surgery but that now she is starting to have postprandial symptoms again where she notices increased shortness of breath after a meal requiring use of albuterol inhaler.  Has not noticed wheezing and her cough continues to be controlled.  But she states that previously the postprandial shortness of breath was the first sign of reflux.  She is still on Dexilant 60 mg daily and is observing antireflux measures.  She does not appear ill on today's visit.  With regards to her COPD she is currently maintained on Breztri 2 puffs twice a day and this controls her symptoms however notes that towards the second dose of the day she becomes short of breath particularly after a meal.  She uses levo albuterol as rescue months after a meal.  She has not had any recent fevers, chills or sweats even with the symptoms she was seen for yesterday.  No wheezing.  No chest pain, orthopnea or paroxysmal nocturnal dyspnea, no lower extremity edema or calf tenderness.  Does not endorse any other symptomatology today.   She is compliant with nocturnal oxygen at 1 L/min for nocturnal hypoxemia related to COPD.  He  notes benefit of the therapy.    She has not made an appointment for lung cancer screening yet but states that the will do so.  She is also to make a follow-up appointment with Dr. Everlene Farrier for follow-up on her antireflux surgery.   DATA 06/18/2016 PFTs: FEV1 1.45 L or 60% predicted VC 2.38 L or 90% predicted FEV1/FVC 61%.  Lung volumes: TLC 102%, RV 140%. DLCO 57%.  No significant bronchodilator response. 09/25/2016 2D echo: LVEF 60 to 65%, normal study. 09/01/2020 overnight oximetry: No significant desaturations. 01/15/2021 PFTs: FEV1 1.46 L or 57% predicted, FVC 2.31 L or 70% predicted, FEV1/FVC 63%, no bronchodilator response. TLC 113, RV 200% diffusion capacity moderately reduced. 06/11/2021 echocardiogram: LVEF 60 to 65%, no regional wall motion abnormality, diastolic send normal, right heart function normal.  No valvular abnormalities.  Review of Systems A 10 point review of systems was performed and it is as noted above otherwise negative.   Patient Active Problem List   Diagnosis Date Noted   Hiatal hernia 03/28/2022   S/P repair of paraesophageal hernia 03/28/2022   Acute gastritis without hemorrhage 02/21/2022   Chronic anterior uveitis of left eye 10/04/2021   Personal history of colonic polyps    Varicose veins of bilateral lower extremities with pain 11/22/2016   Restless leg 11/18/2016   Iron deficiency 11/18/2016   Allergic rhinitis 06/11/2016   Hyperlipidemia 03/19/2015   COPD (chronic  obstructive pulmonary disease) (HCC) 10/04/2014   Depression 09/22/2014   Dyshidrosis 09/22/2014   GERD (gastroesophageal reflux disease) 09/22/2014   Insomnia 09/22/2014   Lung nodule, multiple 09/22/2014   Panic disorder 09/22/2014   Palpitations 01/17/2014   Pulmonary nodule 06/10/2013   Shortness of breath 05/20/2013   COPD, GOLD B 05/20/2013   Tobacco abuse 05/20/2013   Daytime somnolence 05/20/2013   Back pain 08/28/2012   Discoid lupus 05/30/2012   Pain in joint 03/09/2012    Psoriasis 02/21/2012   Fibromyalgia 02/11/2012   Lupus (HCC) 02/11/2012   Panic attacks 02/11/2012   History of adenomatous polyp of colon 09/13/2011   Heartburn 08/06/2011   PVC's (premature ventricular contractions) 01/29/2011   Essential (primary) hypertension 03/04/1998    Social History   Tobacco Use   Smoking status: Every Day    Current packs/day: 1.00    Average packs/day: 1 pack/day for 20.0 years (20.0 ttl pk-yrs)    Types: Cigarettes    Passive exposure: Past   Smokeless tobacco: Never   Tobacco comments:    0.5 PPD -10/09/2022 khj  Substance Use Topics   Alcohol use: No    Alcohol/week: 0.0 standard drinks of alcohol    Allergies  Allergen Reactions   Alum & Mag Hydroxide-Simeth Diarrhea and Nausea Only   Bactrim [Sulfamethoxazole-Trimethoprim] Itching   Cefdinir Other (See Comments)    tachycardia   Chantix [Varenicline]     Bad dreams   Chlorhexidine Gluconate Swelling   Doxycycline Other (See Comments)    Nausea, migraine   Multaq [Dronedarone] Other (See Comments)    Lip/ mouth numbness/ tongue swelling   Ivp Dye [Iodinated Contrast Media] Palpitations    Current Meds  Medication Sig   albuterol (PROVENTIL) (2.5 MG/3ML) 0.083% nebulizer solution USE 1 VIAL IN NEBULIZER EVERY 4 (FOUR) HOURS AS NEEDED FOR WHEEZING OR SHORTNESS OF BREATH.   albuterol (VENTOLIN HFA) 108 (90 Base) MCG/ACT inhaler INHALE 2 PUFFS BY MOUTH EVERY 6 HOURS AS NEEDED FOR WHEEZE OR SHORTNESS OF BREATH   atorvastatin (LIPITOR) 20 MG tablet Take 1 tablet (20 mg total) by mouth daily.   Budeson-Glycopyrrol-Formoterol (BREZTRI AEROSPHERE) 160-9-4.8 MCG/ACT AERO Inhale 2 puffs into the lungs in the morning and at bedtime.   clonazePAM (KLONOPIN) 1 MG tablet TAKE 1 TABLET BY MOUTH 2 TIMES DAILY AS NEEDED. DNF UNTIL 11/3   dexlansoprazole (DEXILANT) 60 MG capsule TAKE 1 CAPSULE BY MOUTH EVERY DAY   fluticasone (FLONASE) 50 MCG/ACT nasal spray Place 2 sprays into both nostrils as needed.    hydroxychloroquine (PLAQUENIL) 200 MG tablet TAKE 2 TABLETS (400 MG TOTAL) BY MOUTH DAILY FOR DISCOID LUPUS (L93.0)   Misc. Devices (PULSE OXIMETER DELUXE) MISC Use daily to monitor oxygen levels   montelukast (SINGULAIR) 10 MG tablet Take 1 tablet (10 mg total) by mouth at bedtime. (Patient taking differently: Take 10 mg by mouth as needed.)   Nebulizers (COMPRESSOR/NEBULIZER) MISC Supplies only   Nebulizers (COMPRESSOR/NEBULIZER) MISC Use as directed   nitroGLYCERIN (NITROSTAT) 0.4 MG SL tablet Place 1 tablet (0.4 mg total) under the tongue every 5 (five) minutes as needed for chest pain.   OXYGEN Inhale 1 L into the lungs as needed.   Respiratory Therapy Supplies (FLUTTER) DEVI 1 Device by Does not apply route daily.   Spacer/Aero-Holding Chambers (AEROCHAMBER MV) inhaler Use as instructed   Spacer/Aero-Holding Chambers (AEROCHAMBER MV) inhaler Use as instructed    Immunization History  Administered Date(s) Administered   Moderna Sars-Covid-2 Vaccination 11/28/2019, 12/26/2019  Pneumococcal Conjugate-13 11/20/2012   Pneumococcal Polysaccharide-23 08/10/2012      Objective:     BP 110/60 (BP Location: Left Arm, Cuff Size: Normal)   Pulse 65   Temp 97.6 F (36.4 C)   Ht 5\' 3"  (1.6 m)   Wt 139 lb 6.4 oz (63.2 kg)   SpO2 99%   BMI 24.69 kg/m   SpO2: 99 % O2 Device: None (Room air)  GENERAL: Awake, alert, fully ambulatory.  In no respiratory distress.  No conversational dyspnea.   HEAD: Normocephalic, atraumatic.  EYES: Pupils equal, round, reactive to light.  No scleral icterus.  MOUTH: Dentures upper and lower. NECK: Supple. No thyromegaly. Trachea midline. No JVD.  No adenopathy. PULMONARY: Good air entry bilaterally.  There are coarse breath sounds throughout, no wheezes. CARDIOVASCULAR: S1 and S2. Regular rate and rhythm.  No rubs, murmurs or gallops heard. ABDOMEN: Benign. MUSCULOSKELETAL: No joint deformity, no clubbing, no edema.  NEUROLOGIC: No overt focal  deficit.  Speech is fluent.  No gait disturbance. SKIN: Intact,warm,dry. PSYCH: Normal mood and behavior.   Assessment & Plan:     ICD-10-CM   1. Stage 2 moderate COPD by GOLD classification (HCC)  J44.9    Appears to be well compensated at present Continue Breztri 2 puffs twice a day Continue as needed albuterol    2. Chronic GERD  K21.9 DG ESOPHAGUS W DOUBLE CM (HD)   Appears to be recurrent Recheck with upper GI    3. Tobacco dependence due to cigarettes  F17.210    Patient was counseled regards to discontinuation of smoking Total counseling time 3 to 5 minutes      Orders Placed This Encounter  Procedures   DG ESOPHAGUS W DOUBLE CM (HD)    Standing Status:   Future    Standing Expiration Date:   10/09/2023    Order Specific Question:   Reason for Exam (SYMPTOM  OR DIAGNOSIS REQUIRED)    Answer:   Reflux, S/P Nissen, recurrent symptoms    Order Specific Question:   Is patient pregnant?    Answer:   No    Order Specific Question:   Preferred imaging location?    Answer:   [facility]    Meds ordered this encounter  Medications   Budeson-Glycopyrrol-Formoterol (BREZTRI AEROSPHERE) 160-9-4.8 MCG/ACT AERO    Sig: Inhale 2 puffs into the lungs in the morning and at bedtime.    Dispense:  11.8 g    Refill:  0    Order Specific Question:   Lot Number?    Answer:   [id number]    Order Specific Question:   Expiration Date?    Answer:   03/04/2025    Order Specific Question:   Manufacturer?    Answer:   [employer] [[id number]]    Order Specific Question:   Quantity    Answer:   2    Will see the patient in follow-up in 3 to 4 months time she is to contact us prior to that time should any new difficulties arise.  Gailen Shelter, MD Advanced Bronchoscopy PCCM [facility]    *This note was dictated using voice recognition software/Dragon.  Despite best efforts to proofread, errors can occur which can change the meaning. Any transcriptional errors that  result from this process are unintentional and may not be fully corrected at the time of dictation.

## 2022-10-14 ENCOUNTER — Ambulatory Visit
Admission: RE | Admit: 2022-10-14 | Discharge: 2022-10-14 | Disposition: A | Discharge status: Home / Self Care | Payer: 59 | Source: Ambulatory Visit | Attending: Pulmonary Disease | Admitting: Pulmonary Disease

## 2022-10-14 DIAGNOSIS — K219 Gastro-esophageal reflux disease without esophagitis: Secondary | ICD-10-CM | POA: Diagnosis not present

## 2022-10-14 DIAGNOSIS — J449 Chronic obstructive pulmonary disease, unspecified: Secondary | ICD-10-CM | POA: Diagnosis not present

## 2022-10-23 DIAGNOSIS — J9621 Acute and chronic respiratory failure with hypoxia: Secondary | ICD-10-CM | POA: Diagnosis not present

## 2022-11-04 ENCOUNTER — Telehealth: Payer: 59 | Admitting: Physician Assistant

## 2022-11-04 DIAGNOSIS — J440 Chronic obstructive pulmonary disease with acute lower respiratory infection: Secondary | ICD-10-CM

## 2022-11-04 DIAGNOSIS — J209 Acute bronchitis, unspecified: Secondary | ICD-10-CM

## 2022-11-04 MED ORDER — AZITHROMYCIN 250 MG PO TABS
ORAL_TABLET | ORAL | 0 refills | Status: AC
Start: 2022-11-04 — End: 2022-11-09

## 2022-11-04 MED ORDER — BENZONATATE 100 MG PO CAPS: 100.0000 mg | ORAL_CAPSULE | Freq: Three times a day (TID) | ORAL | 0 refills | Status: DC | PRN

## 2022-11-04 MED ORDER — PREDNISONE 10 MG (21) PO TBPK: ORAL_TABLET | ORAL | 0 refills | Status: DC

## 2022-11-04 NOTE — Progress Notes (Signed)
E-Visit for Cough  We are sorry that you are not feeling well.  Here is how we plan to help!  Based on your presentation I believe you most likely have A cough due to bacteria.  When patients have a fever and a productive cough with a change in color or increased sputum production, we are concerned about bacterial bronchitis.  If left untreated it can progress to pneumonia.  If your symptoms do not improve with your treatment plan it is important that you contact your provider.   I have prescribed Azithromyin 250 mg: two tablets now and then one tablet daily for 4 additonal days    In addition you may use A non-prescription cough medication called Mucinex DM: take 2 tablets every 12 hours. and A prescription cough medication called Tessalon Perles 100mg . You may take 1-2 capsules every 8 hours as needed for your cough.  Prednisone 10 mg daily for 6 days (see taper instructions below)  Directions for 6 day taper: Day 1: 2 tablets before breakfast, 1 after both lunch & dinner and 2 at bedtime Day 2: 1 tab before breakfast, 1 after both lunch & dinner and 2 at bedtime Day 3: 1 tab at each meal & 1 at bedtime Day 4: 1 tab at breakfast, 1 at lunch, 1 at bedtime Day 5: 1 tab at breakfast & 1 tab at bedtime Day 6: 1 tab at breakfast  From your responses in the eVisit questionnaire you describe inflammation in the upper respiratory tract which is causing a significant cough.  This is commonly called Bronchitis and has four common causes:   Allergies Viral Infections Acid Reflux Bacterial Infection Allergies, viruses and acid reflux are treated by controlling symptoms or eliminating the cause. An example might be a cough caused by taking certain blood pressure medications. You stop the cough by changing the medication. Another example might be a cough caused by acid reflux. Controlling the reflux helps control the cough.  USE OF BRONCHODILATOR ("RESCUE") INHALERS: There is a risk from using your  bronchodilator too frequently.  The risk is that over-reliance on a medication which only relaxes the muscles surrounding the breathing tubes can reduce the effectiveness of medications prescribed to reduce swelling and congestion of the tubes themselves.  Although you feel brief relief from the bronchodilator inhaler, your asthma may actually be worsening with the tubes becoming more swollen and filled with mucus.  This can delay other crucial treatments, such as oral steroid medications. If you need to use a bronchodilator inhaler daily, several times per day, you should discuss this with your provider.  There are probably better treatments that could be used to keep your asthma under control.     HOME CARE Only take medications as instructed by your medical team. Complete the entire course of an antibiotic. Drink plenty of fluids and get plenty of rest. Avoid close contacts especially the very young and the elderly Cover your mouth if you cough or cough into your sleeve. Always remember to wash your hands A steam or ultrasonic humidifier can help congestion.   GET HELP RIGHT AWAY IF: You develop worsening fever. You become short of breath You cough up blood. Your symptoms persist after you have completed your treatment plan MAKE SURE YOU  Understand these instructions. Will watch your condition. Will get help right away if you are not doing well or get worse.    Thank you for choosing an e-visit.  Your e-visit answers were reviewed by a  board certified advanced clinical practitioner to complete your personal care plan. Depending upon the condition, your plan could have included both over the counter or prescription medications.  Please review your pharmacy choice. Make sure the pharmacy is open so you can pick up prescription now. If there is a problem, you may contact your provider through [employer] and have the prescription routed to another pharmacy.  Your safety is important  to [place]. If you have drug allergies check your prescription carefully.   For the next 24 hours you can use MyChart to ask questions about today's visit, request a non-urgent call back, or ask for a work or school excuse. You will get an email in the next two days asking about your experience. I hope that your e-visit has been valuable and will speed your recovery.  I have spent 5 minutes in review of e-visit questionnaire, review and updating patient chart, medical decision making and response to patient.   Margaretann Loveless, PA-C

## 2022-11-07 DIAGNOSIS — J449 Chronic obstructive pulmonary disease, unspecified: Secondary | ICD-10-CM | POA: Diagnosis not present

## 2022-11-12 ENCOUNTER — Other Ambulatory Visit: Payer: Self-pay | Admitting: Gastroenterology

## 2022-11-16 ENCOUNTER — Other Ambulatory Visit: Payer: Self-pay | Admitting: Family Medicine

## 2022-11-16 DIAGNOSIS — L930 Discoid lupus erythematosus: Secondary | ICD-10-CM

## 2022-11-18 DIAGNOSIS — H2512 Age-related nuclear cataract, left eye: Secondary | ICD-10-CM | POA: Diagnosis not present

## 2022-11-18 NOTE — Telephone Encounter (Signed)
Requested medications are due for refill today.  yes  Requested medications are on the active medications list.  yes  Last refill. 05/27/2022 #60 5 rf  Future visit scheduled.   no  Notes to clinic.  Refill not delegated.    Requested Prescriptions  Pending Prescriptions Disp Refills   hydroxychloroquine (PLAQUENIL) 200 MG tablet [Pharmacy Med Name: HYDROXYCHLOROQUINE 200 MG TAB] 60 tablet 5    Sig: TAKE 2 TABLETS (400 MG TOTAL) BY MOUTH DAILY FOR DISCOID LUPUS (L93.0)     Not Delegated - Analgesics:  Antirheumatic Agents - abatacept & hydroxychloroquine Failed - 11/16/2022  8:48 AM      Failed - This refill cannot be delegated      Passed - Valid encounter within last 6 months    Recent Outpatient Visits           4 months ago Acute recurrent frontal sinusitis   [place] [facility] Malva Limes, MD   5 months ago Iron deficiency   [facility] Malva Limes, MD   6 months ago Other fatigue   [place] [facility] Malva Limes, MD   6 months ago COPD with acute exacerbation [facility])   [place] [facility] Merita Norton T, FNP   9 months ago COPD exacerbation [facility])   [place] [facility] [place], Edmon Crape, PA-C       Future Appointments             In 2 weeks Midge Minium, MD [facility]

## 2022-11-19 ENCOUNTER — Encounter: Payer: Self-pay | Admitting: Ophthalmology

## 2022-11-20 ENCOUNTER — Encounter: Payer: Self-pay | Admitting: Ophthalmology

## 2022-11-20 NOTE — Anesthesia Preprocedure Evaluation (Addendum)
Anesthesia Evaluation  Patient identified by MRN, date of birth, ID band Patient awake    Reviewed: Allergy & Precautions, H&P , NPO status , Patient's Chart, lab work & pertinent test results  History of Anesthesia Complications (+) PONV and history of anesthetic complications  Airway Mallampati: III  TM Distance: <3 FB Neck ROM: Full    Dental no notable dental hx. (+) Upper Dentures   Pulmonary shortness of breath, COPD, Current Smoker   Pulmonary exam normal breath sounds clear to auscultation       Cardiovascular hypertension, + CAD  Normal cardiovascular exam Rhythm:Regular Rate:Normal     Neuro/Psych  PSYCHIATRIC DISORDERS Anxiety Depression     Neuromuscular disease    GI/Hepatic Neg liver ROS, hiatal hernia,GERD  ,,  Endo/Other  negative endocrine ROS    Renal/GU negative Renal ROS  negative genitourinary   Musculoskeletal  (+) Arthritis ,  Fibromyalgia -  Abdominal   Peds negative pediatric ROS (+)  Hematology negative hematology ROS (+)   Anesthesia Other Findings PONV (postoperative nausea and vomiting) Anxiety Wears dentures  Non-obstructive CAD in native artery Stage 2 moderate COPD (chronic obstructive pulmonary disease)  by Gold classification Hypertension GERD (gastroesophageal reflux disease) Arthritis Symptomatic PVC's (premature ventricular contractions) Palpitations Tobacco abuse  Dyspnea History of hiatal hernia  COVID  Patient thinks that versed may 'make me talk crazy" so wants to try just fentanyl at first.  [place] Spaniel she "got mean" last time she had "something" that she thinks was fentanyl      Reproductive/Obstetrics negative OB ROS                              Anesthesia Physical Anesthesia Plan  ASA: 3  Anesthesia Plan: MAC   Post-op Pain Management:    Induction: Intravenous  PONV Risk Score and Plan:   Airway Management Planned: Natural  Airway and Nasal Cannula  Additional Equipment:   Intra-op Plan:   Post-operative Plan:   Informed Consent: I have reviewed the patients History and Physical, chart, labs and discussed the procedure including the risks, benefits and alternatives for the proposed anesthesia with the patient or authorized representative who has indicated his/her understanding and acceptance.     Dental Advisory Given  Plan Discussed with: Anesthesiologist, CRNA and Surgeon  Anesthesia Plan Comments: (Patient consented for risks of anesthesia including but not limited to:  - adverse reactions to medications - damage to eyes, teeth, lips or other oral mucosa - nerve damage due to positioning  - sore throat or hoarseness - Damage to heart, brain, nerves, lungs, other parts of body or loss of life  Patient voiced understanding.)         Anesthesia Quick Evaluation

## 2022-11-23 DIAGNOSIS — J9621 Acute and chronic respiratory failure with hypoxia: Secondary | ICD-10-CM | POA: Diagnosis not present

## 2022-11-26 NOTE — Discharge Instructions (Signed)

## 2022-11-27 ENCOUNTER — Ambulatory Visit: Payer: 59 | Admitting: Anesthesiology

## 2022-11-27 ENCOUNTER — Other Ambulatory Visit: Payer: Self-pay

## 2022-11-27 ENCOUNTER — Ambulatory Visit
Admission: RE | Admit: 2022-11-27 | Discharge: 2022-11-27 | Disposition: A | Discharge status: Home / Self Care | Payer: 59 | Attending: Ophthalmology | Admitting: Ophthalmology

## 2022-11-27 ENCOUNTER — Encounter: Payer: Self-pay | Admitting: Ophthalmology

## 2022-11-27 ENCOUNTER — Encounter
Admission: RE | Disposition: A | Discharge status: Home / Self Care | Payer: Self-pay | Source: Home / Self Care | Attending: Ophthalmology

## 2022-11-27 DIAGNOSIS — H5703 Miosis: Secondary | ICD-10-CM | POA: Diagnosis not present

## 2022-11-27 DIAGNOSIS — F32A Depression, unspecified: Secondary | ICD-10-CM | POA: Diagnosis not present

## 2022-11-27 DIAGNOSIS — F17210 Nicotine dependence, cigarettes, uncomplicated: Secondary | ICD-10-CM | POA: Diagnosis not present

## 2022-11-27 DIAGNOSIS — H2512 Age-related nuclear cataract, left eye: Secondary | ICD-10-CM | POA: Insufficient documentation

## 2022-11-27 DIAGNOSIS — F419 Anxiety disorder, unspecified: Secondary | ICD-10-CM | POA: Diagnosis not present

## 2022-11-27 DIAGNOSIS — Z5986 Financial insecurity: Secondary | ICD-10-CM | POA: Insufficient documentation

## 2022-11-27 DIAGNOSIS — I2510 Atherosclerotic heart disease of native coronary artery without angina pectoris: Secondary | ICD-10-CM | POA: Insufficient documentation

## 2022-11-27 DIAGNOSIS — I10 Essential (primary) hypertension: Secondary | ICD-10-CM | POA: Insufficient documentation

## 2022-11-27 DIAGNOSIS — F17200 Nicotine dependence, unspecified, uncomplicated: Secondary | ICD-10-CM | POA: Diagnosis not present

## 2022-11-27 DIAGNOSIS — J449 Chronic obstructive pulmonary disease, unspecified: Secondary | ICD-10-CM | POA: Diagnosis not present

## 2022-11-27 DIAGNOSIS — H269 Unspecified cataract: Secondary | ICD-10-CM | POA: Diagnosis not present

## 2022-11-27 HISTORY — DX: Chronic obstructive pulmonary disease, unspecified: J44.9

## 2022-11-27 HISTORY — PX: CATARACT EXTRACTION W/PHACO: SHX586

## 2022-11-27 SURGERY — PHACOEMULSIFICATION, CATARACT, WITH IOL INSERTION
Anesthesia: Monitor Anesthesia Care | Site: Eye | Laterality: Left

## 2022-11-27 MED ORDER — TETRACAINE HCL 0.5 % OP SOLN
1.0000 [drp] | OPHTHALMIC | Status: DC | PRN
  Administered 2022-11-27 (×2): 1 [drp] via OPHTHALMIC

## 2022-11-27 MED ORDER — TETRACAINE HCL 0.5 % OP SOLN
OPHTHALMIC | Status: AC
  Filled 2022-11-27: qty 4

## 2022-11-27 MED ORDER — SIGHTPATH DOSE#1 BSS IO SOLN
INTRAOCULAR | Status: DC | PRN
  Administered 2022-11-27: 48 mL via OPHTHALMIC

## 2022-11-27 MED ORDER — SIGHTPATH DOSE#1 BSS IO SOLN
INTRAOCULAR | Status: DC | PRN
  Administered 2022-11-27: 1 mL via INTRAMUSCULAR

## 2022-11-27 MED ORDER — SIGHTPATH DOSE#1 BSS IO SOLN
INTRAOCULAR | Status: DC | PRN
  Administered 2022-11-27: 15 mL

## 2022-11-27 MED ORDER — AMISULPRIDE (ANTIEMETIC) 5 MG/2ML IV SOLN
INTRAVENOUS | Status: AC
  Filled 2022-11-27: qty 4

## 2022-11-27 MED ORDER — FENTANYL CITRATE (PF) 100 MCG/2ML IJ SOLN
INTRAMUSCULAR | Status: DC | PRN
  Administered 2022-11-27: 2 ug via INTRAVENOUS

## 2022-11-27 MED ORDER — BRIMONIDINE TARTRATE-TIMOLOL 0.2-0.5 % OP SOLN
OPHTHALMIC | Status: DC | PRN
  Administered 2022-11-27: 1 [drp] via OPHTHALMIC

## 2022-11-27 MED ORDER — AMISULPRIDE (ANTIEMETIC) 5 MG/2ML IV SOLN: 5.0000 mg | Freq: Once | INTRAVENOUS | Status: DC

## 2022-11-27 MED ORDER — AMISULPRIDE (ANTIEMETIC) 5 MG/2ML IV SOLN
10.0000 mg | Freq: Once | INTRAVENOUS | Status: AC
  Administered 2022-11-27: 10 mg via INTRAVENOUS

## 2022-11-27 MED ORDER — SIGHTPATH DOSE#1 NA HYALUR & NA CHOND-NA HYALUR IO KIT
PACK | INTRAOCULAR | Status: DC | PRN
  Administered 2022-11-27: 1 via OPHTHALMIC

## 2022-11-27 MED ORDER — MOXIFLOXACIN HCL 0.5 % OP SOLN
OPHTHALMIC | Status: DC | PRN
  Administered 2022-11-27: .2 mL via OPHTHALMIC

## 2022-11-27 MED ORDER — MIDAZOLAM HCL 2 MG/2ML IJ SOLN
INTRAMUSCULAR | Status: AC
  Filled 2022-11-27: qty 2

## 2022-11-27 MED ORDER — ONDANSETRON HCL 4 MG/2ML IJ SOLN
INTRAMUSCULAR | Status: DC | PRN
Start: 2022-11-27 — End: 2022-11-27
  Administered 2022-11-27: 4 mg via INTRAVENOUS

## 2022-11-27 MED ORDER — ARMC OPHTHALMIC DILATING DROPS
1.0000 | OPHTHALMIC | Status: DC | PRN
  Administered 2022-11-27 (×3): 1 via OPHTHALMIC

## 2022-11-27 MED ORDER — MIDAZOLAM HCL 2 MG/2ML IJ SOLN
INTRAMUSCULAR | Status: DC | PRN
  Administered 2022-11-27: 2 mg via INTRAVENOUS

## 2022-11-27 MED ORDER — FENTANYL CITRATE (PF) 100 MCG/2ML IJ SOLN
INTRAMUSCULAR | Status: AC
  Filled 2022-11-27: qty 2

## 2022-11-27 MED ORDER — ARMC OPHTHALMIC DILATING DROPS
OPHTHALMIC | Status: AC
  Filled 2022-11-27: qty 0.5

## 2022-11-27 MED ORDER — LACTATED RINGERS IV SOLN: INTRAVENOUS | Status: DC

## 2022-11-27 SURGICAL SUPPLY — 10 items
CATARACT SUITE SIGHTPATH (MISCELLANEOUS) ×1
FEE CATARACT SUITE SIGHTPATH (MISCELLANEOUS) ×1 IMPLANT
GLOVE SRG 8 PF TXTR STRL LF DI (GLOVE) ×1 IMPLANT
GLOVE SURG ENC TEXT LTX SZ7.5 (GLOVE) ×1 IMPLANT
GLOVE SURG UNDER POLY LF SZ8 (GLOVE) ×1
LENS IOL TECNIS EYHANCE 20.0 (Intraocular Lens) IMPLANT
NDL FILTER BLUNT 18X1 1/2 (NEEDLE) ×1 IMPLANT
NEEDLE FILTER BLUNT 18X1 1/2 (NEEDLE) ×1
RING MALYGIN 7.0 (MISCELLANEOUS) IMPLANT
SYR 3ML LL SCALE MARK (SYRINGE) ×1 IMPLANT

## 2022-11-27 NOTE — Transfer of Care (Signed)
Immediate Anesthesia Transfer of Care Note  Patient: Andrea [name]  Procedure(s) Performed: CATARACT EXTRACTION PHACO AND INTRAOCULAR LENS PLACEMENT (IOC) LEFT MALYUGIN  3.59  00:31.6 (Left: Eye)  Patient Location: PACU  Anesthesia Type: MAC  Level of Consciousness: awake, alert  and patient cooperative  Airway and Oxygen Therapy: Patient Spontanous Breathing and Patient connected to supplemental oxygen  Post-op Assessment: Post-op Vital signs reviewed, Patient's Cardiovascular Status Stable, Respiratory Function Stable, Patent Airway and No signs of Nausea or vomiting  Post-op Vital Signs: Reviewed and stable  Complications: No notable events documented.

## 2022-11-27 NOTE — H&P (Signed)
[facility]   Primary Care Physician:  Malva Limes, MD Ophthalmologist: Dr. Lockie Mola  Pre-Procedure History & Physical: HPI:  Andrea [name] is a 57 y.o. female here for ophthalmic surgery.   Past Medical History:  Diagnosis Date   Anxiety    panic attacks, chest pain   Arthritis    COPD (chronic obstructive pulmonary disease) (HCC)    COVID    2023   Dyspnea    GERD (gastroesophageal reflux disease)    History of hiatal hernia    Hypertension    not medicated   Non-obstructive CAD in native artery    a. cardiac cath 01/2014: showed minor irregularities, mildly elevated left ventricular end-diastolic pressure and normal ejection fraction; b. 03/2015 MV: low risk.   Palpitations    a. 08/2016 Event Monitor: occas PVC's, two brief runs of SVT (4 and 7 beats).   PONV (postoperative nausea and vomiting)    Stage 2 moderate COPD by GOLD classification (HCC)    Symptomatic PVC's (premature ventricular contractions)    Tobacco abuse    Wears dentures    partial upper and lower    Past Surgical History:  Procedure Laterality Date   CARDIAC CATHETERIZATION  01/03/14   CARDIAC CATHETERIZATION     CHOLECYSTECTOMY     COLONOSCOPY WITH PROPOFOL N/A 10/05/2019   Procedure: COLONOSCOPY WITH PROPOFOL;  Surgeon: Midge Minium, MD;  Location: [facility] ENDOSCOPY;  Service: Endoscopy;  Laterality: N/A;   ESOPHAGOGASTRODUODENOSCOPY (EGD) WITH PROPOFOL N/A 07/11/2016   Procedure: ESOPHAGOGASTRODUODENOSCOPY (EGD) WITH PROPOFOL;  Surgeon: Midge Minium, MD;  Location: [facility];  Service: Endoscopy;  Laterality: N/A;   ESOPHAGOGASTRODUODENOSCOPY (EGD) WITH PROPOFOL N/A 10/05/2019   Procedure: ESOPHAGOGASTRODUODENOSCOPY (EGD) WITH PROPOFOL;  Surgeon: Midge Minium, MD;  Location: [facility] ENDOSCOPY;  Service: Endoscopy;  Laterality: N/A;   ESOPHAGOGASTRODUODENOSCOPY (EGD) WITH PROPOFOL N/A 02/21/2022   Procedure: ESOPHAGOGASTRODUODENOSCOPY (EGD) WITH PROPOFOL;  Surgeon: Midge Minium, MD;  Location: [facility] ENDOSCOPY;  Service: Endoscopy;  Laterality: N/A;   EYE SURGERY     IMAGE GUIDED SINUS SURGERY N/A 11/30/2014   Procedure: IMAGE GUIDED SINUS SURGERY;  Surgeon: Bud Face, MD;  Location: [facility];  Service: ENT;  Laterality: N/A;  GAVE DISK TO CE CE   INSERTION OF MESH  03/28/2022   Procedure: INSERTION OF MESH;  Surgeon: Leafy Ro, MD;  Location: [facility] ORS;  Service: General;;   MAXILLARY ANTROSTOMY Bilateral 11/30/2014   Procedure: MAXILLARY ANTROSTOMY;  Surgeon: Bud Face, MD;  Location: [facility];  Service: ENT;  Laterality: Bilateral;   SEPTOPLASTY N/A 11/30/2014   Procedure: SEPTOPLASTY;  Surgeon: Bud Face, MD;  Location: [facility];  Service: ENT;  Laterality: N/A;   SPHENOIDECTOMY Left 11/30/2014   Procedure: Selina Cooley, ;  Surgeon: Bud Face, MD;  Location: [facility];  Service: ENT;  Laterality: Left;   VAGINAL HYSTERECTOMY  1995   vaginal   XI ROBOTIC ASSISTED PARAESOPHAGEAL HERNIA REPAIR N/A 03/28/2022   Procedure: XI ROBOTIC ASSISTED PARAESOPHAGEAL HERNIA REPAIR, RNFA to assist;  Surgeon: Leafy Ro, MD;  Location: [facility] ORS;  Service: General;  Laterality: N/A;    Prior to Admission medications   Medication Sig Start Date End Date Taking? Authorizing Provider  albuterol (PROVENTIL) (2.5 MG/3ML) 0.083% nebulizer solution USE 1 VIAL IN NEBULIZER EVERY 4 (FOUR) HOURS AS NEEDED FOR WHEEZING OR SHORTNESS OF BREATH. 11/28/20  Yes Kasa, Kurian, MD  albuterol (VENTOLIN HFA) 108 (90 Base) MCG/ACT inhaler INHALE 2 PUFFS BY MOUTH  EVERY 6 HOURS AS NEEDED FOR WHEEZE OR SHORTNESS OF BREATH 09/09/22  Yes Malva Limes, MD  atorvastatin (LIPITOR) 20 MG tablet Take 1 tablet (20 mg total) by mouth daily. 01/14/22  Yes Malva Limes, MD  Budeson-Glycopyrrol-Formoterol (BREZTRI AEROSPHERE) 160-9-4.8 MCG/ACT AERO Inhale 2 puffs into the lungs in the morning and at bedtime. 09/17/22  Yes Salena Saner, MD  clonazePAM (KLONOPIN) 1 MG tablet TAKE 1 TABLET BY MOUTH 2 TIMES DAILY AS NEEDED. DNF UNTIL 11/3 08/10/22  Yes Malva Limes, MD  dexlansoprazole (DEXILANT) 60 MG capsule TAKE 1 CAPSULE BY MOUTH EVERY DAY 11/12/22  Yes Midge Minium, MD  fluticasone (FLONASE) 50 MCG/ACT nasal spray Place 2 sprays into both nostrils as needed. 07/05/22  Yes Malva Limes, MD  hydroxychloroquine (PLAQUENIL) 200 MG tablet TAKE 2 TABLETS (400 MG TOTAL) BY MOUTH DAILY FOR DISCOID LUPUS (L93.0) 11/19/22  Yes Malva Limes, MD  Misc. Devices (PULSE OXIMETER DELUXE) MISC Use daily to monitor oxygen levels 01/07/20  Yes Margaretann Loveless, PA-C  nitroGLYCERIN (NITROSTAT) 0.4 MG SL tablet Place 1 tablet (0.4 mg total) under the tongue every 5 (five) minutes as needed for chest pain. 12/08/14  Yes Iran Ouch, MD  OXYGEN Inhale 2 L into the lungs as needed (at while sleeping).   Yes [provider]  Respiratory Therapy Supplies (FLUTTER) DEVI 1 Device by Does not apply route daily. 03/19/15  Yes Katharina Caper, MD  Spacer/Aero-Holding Chambers (AEROCHAMBER MV) inhaler Use as instructed 01/13/20  Yes Salena Saner, MD  Spacer/Aero-Holding Chambers (AEROCHAMBER MV) inhaler Use as instructed 07/19/21  Yes Parrett, Keajah S, NP  benzonatate (TESSALON) 100 MG capsule Take 1 capsule (100 mg total) by mouth 3 (three) times daily as needed. Patient not taking: Reported on 11/19/2022 11/04/22   Margaretann Loveless, PA-C  Budeson-Glycopyrrol-Formoterol (BREZTRI AEROSPHERE) 160-9-4.8 MCG/ACT AERO Inhale 2 puffs into the lungs in the morning and at bedtime. 10/09/22   Salena Saner, MD  levalbuterol [facility] HFA) 45 MCG/ACT inhaler Inhale 2 puffs into the lungs every 4 (four) hours as needed for wheezing. Patient not taking: Reported on 10/09/2022 05/28/21   Salena Saner, MD  methocarbamol (ROBAXIN) 500 MG tablet Take 1 tablet (500 mg total) by mouth every 8 (eight) hours as needed for muscle  spasms. Patient not taking: Reported on 10/09/2022 03/29/22   Donovan Kail, PA-C  montelukast (SINGULAIR) 10 MG tablet Take 1 tablet (10 mg total) by mouth at bedtime. Patient taking differently: Take 10 mg by mouth as needed. 04/25/22   Jacky Kindle, FNP  Nebulizers (COMPRESSOR/NEBULIZER) MISC Supplies only 11/28/20   Erin Fulling, MD  Nebulizers (COMPRESSOR/NEBULIZER) MISC Use as directed 12/01/20   Erin Fulling, MD  ondansetron (ZOFRAN) 4 MG tablet Take 1 tablet (4 mg total) by mouth every 8 (eight) hours as needed for nausea or vomiting. Patient not taking: Reported on 08/21/2022 05/15/22   Sterling Big F, MD  oxymetazoline [facility] NASAL SPRAY) 0.05 % nasal spray 2 sprays per nare for acute nasal bleeding/epistaxis Patient not taking: Reported on 10/09/2022 04/25/22   Merita Norton T, FNP  prednisoLONE acetate (PRED FORTE) 1 % ophthalmic suspension PLACE 1 DROP INTO THE LEFT EYE 4 (FOUR) TIMES DAILY FOR AT LEAST TWO DAYS AND CONTINUE UNTIL SYMPTOMS IMPROVE, ONCE SYMPTOMS IMPROVE PLEASE TAPER DOSING, 3 TIMES DAY, TWICE DAILY, ONCE DAILY. DO NOT TAKE WITH YOUR OTHER EYE DROP -DUREZOL Patient not taking: Reported on 10/09/2022 06/29/22   Sherrie Mustache,  Demetrios Isaacs, MD  predniSONE (STERAPRED UNI-PAK 21 TAB) 10 MG (21) TBPK tablet 6 day taper; take as directed on package instructions Patient not taking: Reported on 11/19/2022 11/04/22   Margaretann Loveless, PA-C    Allergies as of 11/11/2022 - Review Complete 11/04/2022  Allergen Reaction Noted   Alum & mag hydroxide-simeth Diarrhea and Nausea Only 11/02/2014   Bactrim [sulfamethoxazole-trimethoprim] Itching 01/17/2020   Cefdinir Other (See Comments) 07/06/2013   Chantix [varenicline]  06/11/2016   Chlorhexidine gluconate Swelling 03/28/2022   Doxycycline Other (See Comments) 11/18/2013   Multaq [dronedarone] Other (See Comments) 07/22/2017   Ivp dye [iodinated contrast media] Palpitations 05/20/2013    Family History  Problem Relation Age of Onset    Emphysema Father    Asthma Father    Heart disease Father    Heart attack Father    Arthritis Father        RA   Colon cancer Father    Hypertension Mother    Breast cancer Neg Hx     Social History   Socioeconomic History   Marital status: Married    Spouse name: Not on file   Number of children: 3   Years of education: Not on file   Highest education level: GED or equivalent  Occupational History   Occupation: Disabled    Comment: Social Security as of 05/21/2011  Tobacco Use   Smoking status: Every Day    Current packs/day: 1.00    Average packs/day: 1 pack/day for 20.0 years (20.0 ttl pk-yrs)    Types: Cigarettes    Passive exposure: Past   Smokeless tobacco: Never   Tobacco comments:    0.5 PPD -10/09/2022 khj  Vaping Use   Vaping status: Never Used  Substance and Sexual Activity   Alcohol use: No    Alcohol/week: 0.0 standard drinks of alcohol   Drug use: No   Sexual activity: Not Currently  Other Topics Concern   Not on file  Social History Narrative   Lives at home with husband. Independent at baseline.   Social Determinants of Health   Financial Resource Strain: Medium Risk (08/21/2022)   Overall Financial Resource Strain (CARDIA)    Difficulty of Paying Living Expenses: Somewhat hard  Food Insecurity: No Food Insecurity (08/21/2022)   Hunger Vital Sign    Worried About Running Out of Food in the Last Year: Never true    Ran Out of Food in the Last Year: Never true  Transportation Needs: No Transportation Needs (08/21/2022)   PRAPARE - Administrator, Civil Service (Medical): No    Lack of Transportation (Non-Medical): No  Physical Activity: Inactive (08/21/2022)   Exercise Vital Sign    Days of Exercise per Week: 0 days    Minutes of Exercise per Session: 0 min  Stress: Stress Concern Present (08/21/2022)   [employer] of Occupational Health - Occupational Stress Questionnaire    Feeling of Stress : To some extent  Social Connections:  Moderately Isolated (04/12/2021)   Social Connection and Isolation Panel [NHANES]    Frequency of Communication with Friends and Family: More than three times a week    Frequency of Social Gatherings with Friends and Family: Once a week    Attends Religious Services: Never    Database administrator or Organizations: No    Attends Banker Meetings: Never    Marital Status: Married  Catering manager Violence: Not At Risk (08/21/2022)   Humiliation, Afraid, Rape, and Kick  questionnaire    Fear of Current or Ex-Partner: No    Emotionally Abused: No    Physically Abused: No    Sexually Abused: No    Review of Systems: See HPI, otherwise negative ROS  Physical Exam: BP 132/61   Pulse 75   Temp (!) 97.4 F (36.3 C) (Temporal)   Resp 18   Ht 5\' 3"  (1.6 m)   Wt 61.2 kg   SpO2 99%   BMI 23.91 kg/m  General:   Alert,  pleasant and cooperative in NAD Head:  Normocephalic and atraumatic. Lungs:  Clear to auscultation.    Heart:  Regular rate and rhythm.   Impression/Plan: Thressa Sheller Graper is here for ophthalmic surgery.  Risks, benefits, limitations, and alternatives regarding ophthalmic surgery have been reviewed with the patient.  Questions have been answered.  All parties agreeable.   Lockie Mola, MD  11/27/2022, 10:36 AM

## 2022-11-27 NOTE — Op Note (Signed)
OPERATIVE NOTE  Andrea [name] [id number] 11/27/2022  PREOPERATIVE DIAGNOSIS:   Nuclear sclerotic cataract left eye with miotic pupil      H25.12   POSTOPERATIVE DIAGNOSIS:   Nuclear sclerotic cataract left eye with miotic pupil.     PROCEDURE:  Phacoemulsification with posterior chamber intraocular lens implantation of the left eye which required pupil stretching with the Malyugin pupil expansion device  Ultrasound time: Procedure(s): CATARACT EXTRACTION PHACO AND INTRAOCULAR LENS PLACEMENT (IOC) LEFT MALYUGIN  3.59  00:31.6 (Left)  LENS:   Implant Name Type Inv. Item Serial No. Manufacturer Lot No. LRB No. Used Action  LENS IOL TECNIS EYHANCE 20.0 - [id number] Intraocular Lens LENS IOL TECNIS EYHANCE 20.0 [id number] [employer]  Left 1 Implanted       SURGEON:  Deirdre Evener, MD   ANESTHESIA: Topical with tetracaine drops and 2% Xylocaine jelly, augmented with 1% preservative-free intracameral lidocaine.   COMPLICATIONS:  None.   DESCRIPTION OF PROCEDURE:  The patient was identified in the holding room and transported to the operating room and placed in the supine position under the operating microscope.  The left eye was identified as the operative eye and it was prepped and draped in the usual sterile ophthalmic fashion.   A 1 millimeter clear-corneal paracentesis was made at the 1:30 position.  The anterior chamber was filled with Viscoat viscoelastic.  0.5 ml of preservative-free 1% lidocaine was injected into the anterior chamber. Posterior synechia were lysed. A 2.4 millimeter keratome was used to make a near-clear corneal incision at the 10:30 position.  A Malyugin pupil expander was then placed through the main incision and into the anterior chamber of the eye.  The edge of the iris was secured on the lip of the pupil expander and it was released, thereby expanding the pupil to approximately 7 millimeters for completion of the cataract surgery.  Additional Viscoat was  placed in the anterior chamber.  A cystotome and capsulorrhexis forceps were used to make a curvilinear capsulorrhexis.   Balanced salt solution was used to hydrodissect and hydrodelineate the lens nucleus.   Phacoemulsification was used in stop and chop fashion to remove the lens, nucleus and epinucleus.  The remaining cortex was aspirated using the irrigation aspiration handpiece.  Additional Provisc was placed into the eye to distend the capsular bag for lens placement.  A lens was then injected into the capsular bag.  The pupil expanding ring was removed using a Kuglen hook and insertion device. The remaining viscoelastic was aspirated from the capsular bag and the anterior chamber.  The anterior chamber was filled with balanced salt solution to inflate to a physiologic pressure.   Wounds were hydrated with balanced salt solution.  The anterior chamber was inflated to a physiologic pressure with balanced salt solution.  No wound leaks were noted. Cefuroxime 0.1 ml of a 10mg /ml solution was injected into the anterior chamber for a dose of 1 mg of intracameral antibiotic at the completion of the case.   Timolol and Brimonidine drops were applied to the eye.  The patient was taken to the recovery room in stable condition without complications of anesthesia or surgery.  [name] 11/27/2022, 11:40 AM

## 2022-11-27 NOTE — Anesthesia Postprocedure Evaluation (Signed)
Anesthesia Post Note  Patient: PIERA BALAM  Procedure(s) Performed: CATARACT EXTRACTION PHACO AND INTRAOCULAR LENS PLACEMENT (IOC) LEFT MALYUGIN  3.59  00:31.6 (Left: Eye)  Patient location during evaluation: PACU Anesthesia Type: MAC Level of consciousness: awake and alert Pain management: pain level controlled Vital Signs Assessment: post-procedure vital signs reviewed and stable Respiratory status: spontaneous breathing, nonlabored ventilation, respiratory function stable and patient connected to nasal cannula oxygen Cardiovascular status: stable and blood pressure returned to baseline Postop Assessment: no apparent nausea or vomiting Anesthetic complications: no Comments: Patient did experience PONV, but was relieved by barhemsys, after already having had zofran. Would recommend not using fentanyl in future and this was added to her list of "allergies" (actually, adverse reaction to fentanyl for her  is PONV).    No notable events documented.   Last Vitals:  Vitals:   11/27/22 1145 11/27/22 1152  BP: 127/70 119/72  Pulse: 74 85  Resp: 13 14  Temp:  (!) 36.2 C  SpO2: 96% 96%    Last Pain:  Vitals:   11/27/22 1152  TempSrc:   PainSc: 0-No pain                 Arielys Wandersee C Baird Polinski

## 2022-11-29 ENCOUNTER — Other Ambulatory Visit: Payer: Self-pay | Admitting: Family Medicine

## 2022-11-29 DIAGNOSIS — E785 Hyperlipidemia, unspecified: Secondary | ICD-10-CM

## 2022-11-29 NOTE — Telephone Encounter (Signed)
Requested Prescriptions  Refused Prescriptions Disp Refills   atorvastatin (LIPITOR) 20 MG tablet [Pharmacy Med Name: ATORVASTATIN 20 MG TABLET] 90 tablet 3    Sig: TAKE 1 TABLET BY MOUTH EVERY DAY     Cardiovascular:  Antilipid - Statins Failed - 11/29/2022  1:18 PM      Failed - Lipid Panel in normal range within the last 12 months    Cholesterol, Total  Date Value Ref Range Status  01/09/2022 204 (H) 100 - 199 mg/dL Final   LDL Chol Calc (NIH)  Date Value Ref Range Status  01/09/2022 139 (H) 0 - 99 mg/dL Final   HDL  Date Value Ref Range Status  01/09/2022 43 >39 mg/dL Final   Triglycerides  Date Value Ref Range Status  01/09/2022 119 0 - 149 mg/dL Final         Passed - Patient is not pregnant      Passed - Valid encounter within last 12 months    Recent Outpatient Visits           4 months ago Acute recurrent frontal sinusitis   [place] [facility] Malva Limes, MD   5 months ago Iron deficiency   [facility] Malva Limes, MD   6 months ago Other fatigue   [place] [facility] Malva Limes, MD   7 months ago COPD with acute exacerbation [facility])   [place] [facility] Merita Norton T, FNP   9 months ago COPD exacerbation [facility])   [place] [facility] [place] [place], PA-C       Future Appointments             In 4 days Midge Minium, MD [facility] [place] [facility]

## 2022-12-03 ENCOUNTER — Ambulatory Visit (INDEPENDENT_AMBULATORY_CARE_PROVIDER_SITE_OTHER): Payer: 59 | Admitting: Gastroenterology

## 2022-12-03 ENCOUNTER — Encounter: Payer: Self-pay | Admitting: Gastroenterology

## 2022-12-03 VITALS — BP 126/85 | HR 67 | Temp 98.4°F | Ht 63.0 in | Wt 135.0 lb

## 2022-12-03 DIAGNOSIS — K219 Gastro-esophageal reflux disease without esophagitis: Secondary | ICD-10-CM | POA: Diagnosis not present

## 2022-12-03 DIAGNOSIS — R0602 Shortness of breath: Secondary | ICD-10-CM

## 2022-12-03 NOTE — Progress Notes (Signed)
Primary Care Physician: Malva Limes, MD  Primary Gastroenterologist:  Dr. Midge Minium  Chief Complaint  Patient presents with   Follow-up    HPI: Andrea [name] is a 57 y.o. female here with a history of reflux.  The patient to me November of last year and after that had a CT scan of the abdomen that showed a segment of thickened sigmoid colon's and suggestion that the patient needs a colonoscopy.  The patient also has had 2 barium swallows 1 with findings of reflux to the lower third of the esophagus with barium and the other one with reflux up to the thoracic inlet.  At the beginning of this year the patient had a Nissen's fundoplication with a hiatal hernia repair. The patient reports that after the surgery she was doing much better without shortness of breath after she ate.  That was her main issue with the reflux was that she was having shortness of breath after eating.  Recently the patient started to have it shortness of breath after she eats.  She only eats 1 meal a day because of this.  The patient states that she went to see her pulmonologist who read quested that she go back to see her GI doctor.  The patient now is on Dexilant still and is still having shortness of breath.  The patient's repeat barium as mentioned above did show that there was continued reflux into the esophagus and barium.  Past Medical History:  Diagnosis Date   Anxiety    panic attacks, chest pain   Arthritis    COPD (chronic obstructive pulmonary disease) (HCC)    COVID    2023   Dyspnea    GERD (gastroesophageal reflux disease)    History of hiatal hernia    Hypertension    not medicated   Non-obstructive CAD in native artery    a. cardiac cath 01/2014: showed minor irregularities, mildly elevated left ventricular end-diastolic pressure and normal ejection fraction; b. 03/2015 MV: low risk.   Palpitations    a. 08/2016 Event Monitor: occas PVC's, two brief runs of SVT (4 and 7 beats).   PONV  (postoperative nausea and vomiting)    PONV (postoperative nausea and vomiting)    Stage 2 moderate COPD by GOLD classification (HCC)    Symptomatic PVC's (premature ventricular contractions)    Tobacco abuse    Wears dentures    partial upper and lower    Current Outpatient Medications  Medication Sig Dispense Refill   albuterol (PROVENTIL) (2.5 MG/3ML) 0.083% nebulizer solution USE 1 VIAL IN NEBULIZER EVERY 4 (FOUR) HOURS AS NEEDED FOR WHEEZING OR SHORTNESS OF BREATH. 120 mL 11   albuterol (VENTOLIN HFA) 108 (90 Base) MCG/ACT inhaler INHALE 2 PUFFS BY MOUTH EVERY 6 HOURS AS NEEDED FOR WHEEZE OR SHORTNESS OF BREATH 8.5 each 4   atorvastatin (LIPITOR) 20 MG tablet Take 1 tablet (20 mg total) by mouth daily. 90 tablet 3   Budeson-Glycopyrrol-Formoterol (BREZTRI AEROSPHERE) 160-9-4.8 MCG/ACT AERO Inhale 2 puffs into the lungs in the morning and at bedtime. 10.7 g 5   clonazePAM (KLONOPIN) 1 MG tablet TAKE 1 TABLET BY MOUTH 2 TIMES DAILY AS NEEDED. DNF UNTIL 11/3 60 tablet 1   dexlansoprazole (DEXILANT) 60 MG capsule TAKE 1 CAPSULE BY MOUTH EVERY DAY 90 capsule 0   fluticasone (FLONASE) 50 MCG/ACT nasal spray Place 2 sprays into both nostrils as needed. 48 mL 2   hydroxychloroquine (PLAQUENIL) 200 MG tablet TAKE 2 TABLETS (  400 MG TOTAL) BY MOUTH DAILY FOR DISCOID LUPUS (L93.0) 60 tablet 3   levalbuterol (XOPENEX HFA) 45 MCG/ACT inhaler Inhale 2 puffs into the lungs every 4 (four) hours as needed for wheezing. 1 each 11   methocarbamol (ROBAXIN) 500 MG tablet Take 1 tablet (500 mg total) by mouth every 8 (eight) hours as needed for muscle spasms. 15 tablet 0   Misc. Devices (PULSE OXIMETER DELUXE) MISC Use daily to monitor oxygen levels 1 each 0   montelukast (SINGULAIR) 10 MG tablet Take 1 tablet (10 mg total) by mouth at bedtime. (Patient taking differently: Take 10 mg by mouth as needed.) 90 tablet 3   Nebulizers (COMPRESSOR/NEBULIZER) MISC Supplies only 1 each 1   Nebulizers  (COMPRESSOR/NEBULIZER) MISC Use as directed 1 each 0   nitroGLYCERIN (NITROSTAT) 0.4 MG SL tablet Place 1 tablet (0.4 mg total) under the tongue every 5 (five) minutes as needed for chest pain. 25 tablet 0   ondansetron (ZOFRAN) 4 MG tablet Take 1 tablet (4 mg total) by mouth every 8 (eight) hours as needed for nausea or vomiting. 20 tablet 1   OXYGEN Inhale 2 L into the lungs as needed (at while sleeping).     oxymetazoline (AFRIN NASAL SPRAY) 0.05 % nasal spray 2 sprays per nare for acute nasal bleeding/epistaxis 30 mL 0   prednisoLONE acetate (PRED FORTE) 1 % ophthalmic suspension PLACE 1 DROP INTO THE LEFT EYE 4 (FOUR) TIMES DAILY FOR AT LEAST TWO DAYS AND CONTINUE UNTIL SYMPTOMS IMPROVE, ONCE SYMPTOMS IMPROVE PLEASE TAPER DOSING, 3 TIMES DAY, TWICE DAILY, ONCE DAILY. DO NOT TAKE WITH YOUR OTHER EYE DROP -DUREZOL 5 mL 0   predniSONE (STERAPRED UNI-PAK 21 TAB) 10 MG (21) TBPK tablet 6 day taper; take as directed on package instructions 21 tablet 0   Respiratory Therapy Supplies (FLUTTER) DEVI 1 Device by Does not apply route daily. 1 each 0   Spacer/Aero-Holding Chambers (AEROCHAMBER MV) inhaler Use as instructed 1 each 0   Spacer/Aero-Holding Chambers (AEROCHAMBER MV) inhaler Use as instructed 1 each 0   No current facility-administered medications for this visit.    Allergies as of 12/03/2022 - Review Complete 12/03/2022  Allergen Reaction Noted   Alum & mag hydroxide-simeth Diarrhea and Nausea Only 11/02/2014   Bactrim [sulfamethoxazole-trimethoprim] Itching 01/17/2020   Cefdinir Other (See Comments) 07/06/2013   Chantix [varenicline]  06/11/2016   Chlorhexidine gluconate Swelling 03/28/2022   Doxycycline Other (See Comments) 11/18/2013   Fentanyl Nausea And Vomiting 11/27/2022   Multaq [dronedarone] Other (See Comments) 07/22/2017   Ivp dye [iodinated contrast media] Palpitations 05/20/2013    ROS:  General: Negative for anorexia, weight loss, fever, chills, fatigue,  weakness. ENT: Negative for hoarseness, difficulty swallowing , nasal congestion. CV: Negative for chest pain, angina, palpitations, dyspnea on exertion, peripheral edema.  Respiratory: Negative for dyspnea at rest, dyspnea on exertion, cough, sputum, wheezing.  GI: See history of present illness. GU:  Negative for dysuria, hematuria, urinary incontinence, urinary frequency, nocturnal urination.  Endo: Negative for unusual weight change.    Physical Examination:   BP 126/85 (BP Location: Left Arm, Patient Position: Sitting, Cuff Size: Normal)   Pulse 67   Temp 98.4 F (36.9 C) (Oral)   Ht 5\' 3"  (1.6 m)   Wt 135 lb (61.2 kg)   BMI 23.91 kg/m   General: Well-nourished, well-developed in no acute distress.  Eyes: No icterus. Conjunctivae pink. Neuro: Alert and oriented x 3.  Grossly intact. Skin: Warm and dry, no jaundice.  Psych: Alert and cooperative, normal mood and affect.  Labs:    Imaging Studies: No results found.  Assessment and Plan:   Andrea [name] is a 57 y.o. y/o female who comes in today with a history of reflux causing shortness of breath.  The patient underwent a antireflux surgery and was doing well until recently when she started to have shortness of breath again.  The patient does not have any symptoms when she lays down or related to position but only after she eats.  The patient will continue her present medication and has been told that there is nothing that I can do to stop the reflux with medication and that she should reach out to Dr. Everlene Farrier to see if revision of the surgery versus augmentation with other antireflux procedures would be advisable in this patient.  The patient has been explained the plan and agrees with it.     Midge Minium, MD. Clementeen Graham    Note: This dictation was prepared with Dragon dictation along with smaller phrase technology. Any transcriptional errors that result from this process are unintentional.

## 2022-12-04 ENCOUNTER — Ambulatory Visit: Payer: 59 | Admitting: Surgery

## 2022-12-04 ENCOUNTER — Encounter: Payer: Self-pay | Admitting: Surgery

## 2022-12-04 VITALS — BP 129/86 | HR 76 | Temp 98.0°F | Ht 63.0 in | Wt 134.8 lb

## 2022-12-04 DIAGNOSIS — R1084 Generalized abdominal pain: Secondary | ICD-10-CM

## 2022-12-04 DIAGNOSIS — F17210 Nicotine dependence, cigarettes, uncomplicated: Secondary | ICD-10-CM

## 2022-12-04 DIAGNOSIS — J449 Chronic obstructive pulmonary disease, unspecified: Secondary | ICD-10-CM

## 2022-12-04 DIAGNOSIS — K219 Gastro-esophageal reflux disease without esophagitis: Secondary | ICD-10-CM

## 2022-12-04 MED ORDER — SUCRALFATE 1 G PO TABS: 1.0000 g | ORAL_TABLET | Freq: Three times a day (TID) | ORAL | 1 refills | Status: DC

## 2022-12-04 NOTE — Patient Instructions (Addendum)
We are going to schedule you for a CT scan to have a good look at the surgical area.   You are scheduled for a CT scan at [facility] on Wednesday October 9th. You will need to arrive at 2:45 pm at the [facility] entrance.  We will have you follow up here after we get your results.

## 2022-12-05 ENCOUNTER — Telehealth: Payer: Self-pay | Admitting: Pulmonary Disease

## 2022-12-05 ENCOUNTER — Ambulatory Visit: Payer: Self-pay

## 2022-12-05 ENCOUNTER — Other Ambulatory Visit: Payer: Self-pay | Admitting: Family Medicine

## 2022-12-05 MED ORDER — AZITHROMYCIN 250 MG PO TABS: ORAL_TABLET | ORAL | 0 refills | Status: DC

## 2022-12-05 MED ORDER — METHYLPREDNISOLONE 4 MG PO TBPK: ORAL_TABLET | ORAL | 0 refills | Status: DC

## 2022-12-05 NOTE — Telephone Encounter (Signed)
Okay to send Medrol Dosepak and Azithromycin Dosepak.

## 2022-12-05 NOTE — Telephone Encounter (Signed)
I have sent in the prescriptions and notified the patient.  Nothing further needed. 

## 2022-12-05 NOTE — Progress Notes (Signed)
Surgical Consultation    Andrea [name] is [name] 57 y.o. female.   Chief Complaint  Patient presents with   Follow-up   HPI:  57 yo  female seen in F/U after paraesophageal hernia repair 6 months ago.  Initially she had great results after surgery and most of her pulmonary symptoms improved.  She does have some dyspnea on exertion due to chronic COPD.  She continues to smoke about three quarters of a pack a day.  He now endorses recurrent reflux.  She did have barium swallow that I have personally reviewed showing evidence of some reflux.  No evidence of recurrence , no obvious paraesophageal deffects    Past Medical History:  Diagnosis Date   Anxiety    panic attacks, chest pain   Arthritis    COPD (chronic obstructive pulmonary disease) (HCC)    COVID    2023   Dyspnea    GERD (gastroesophageal reflux disease)    History of hiatal hernia    Hypertension    not medicated   Non-obstructive CAD in native artery    a. cardiac cath 01/2014: showed minor irregularities, mildly elevated left ventricular end-diastolic pressure and normal ejection fraction; b. 03/2015 MV: low risk.   Palpitations    a. 08/2016 Event Monitor: occas PVC's, two brief runs of SVT (4 and 7 beats).   PONV (postoperative nausea and vomiting)    PONV (postoperative nausea and vomiting)    Stage 2 moderate COPD by GOLD classification (HCC)    Symptomatic PVC's (premature ventricular contractions)    Tobacco abuse    Wears dentures    partial upper and lower    Past Surgical History:  Procedure Laterality Date   CARDIAC CATHETERIZATION  01/03/14   CARDIAC CATHETERIZATION     CATARACT EXTRACTION W/PHACO Left 11/27/2022   Procedure: CATARACT EXTRACTION PHACO AND INTRAOCULAR LENS PLACEMENT (IOC) LEFT MALYUGIN  3.59  00:31.6;  Surgeon: Lockie Mola, MD;  Location: [facility];  Service: Ophthalmology;  Laterality: Left;   CHOLECYSTECTOMY     COLONOSCOPY WITH PROPOFOL N/A 10/05/2019   Procedure:  COLONOSCOPY WITH PROPOFOL;  Surgeon: Midge Minium, MD;  Location: [facility] ENDOSCOPY;  Service: Endoscopy;  Laterality: N/A;   ESOPHAGOGASTRODUODENOSCOPY (EGD) WITH PROPOFOL N/A 07/11/2016   Procedure: ESOPHAGOGASTRODUODENOSCOPY (EGD) WITH PROPOFOL;  Surgeon: Midge Minium, MD;  Location: [facility] SURGERY [facility]TR;  Service: Endoscopy;  Laterality: N/A;   ESOPHAGOGASTRODUODENOSCOPY (EGD) WITH PROPOFOL N/A 10/05/2019   Procedure: ESOPHAGOGASTRODUODENOSCOPY (EGD) WITH PROPOFOL;  Surgeon: Midge Minium, MD;  Location: [facility] ENDOSCOPY;  Service: Endoscopy;  Laterality: N/A;   ESOPHAGOGASTRODUODENOSCOPY (EGD) WITH PROPOFOL N/A 02/21/2022   Procedure: ESOPHAGOGASTRODUODENOSCOPY (EGD) WITH PROPOFOL;  Surgeon: Midge Minium, MD;  Location: [facility]COPY;  Service: Endoscopy;  Laterality: N/A;   EYE SURGERY     IMAGE GUIDED SINUS SURGERY N/A 11/30/2014   Procedure: IMAGE GUIDED SINUS SURGERY;  Surgeon: Bud Face, MD;  Location: [facility];  Service: ENT;  Laterality: N/A;  GAVE DISK TO CE CE   INSERTION OF MESH  03/28/2022   Procedure: INSERTION OF MESH;  Surgeon: Leafy Ro, MD;  Location: [facility] ORS;  Service: General;;   MAXILLARY ANTROSTOMY Bilateral 11/30/2014   Procedure: MAXILLARY ANTROSTOMY;  Surgeon: Bud Face, MD;  Location: [facility] SUR[facility]ERY [facility];  Service: ENT;  Laterality: Bilateral;   SEPTOPLASTY N/A 11/30/2014   Procedure: SEPTOPLASTY;  Surgeon: Bud Face, MD;  Location: [facility] SURGERY CNTR;  Service: ENT;  Laterality: N/A;   SPHENOIDECTOMY Left 11/30/2014   Procedure: SPHENOIDECTOMY, ;  Surgeon: Bud Face, MD;  Location: [facility] SURGERY CNTR;  Service: ENT;  Laterality: Left;   VAGINAL HYSTERECTOMY  1995   vaginal   XI ROBOTIC ASSISTED PARAESOPHAGEAL HERNIA REPAIR N/A 03/28/2022   Procedure: XI ROBOTIC ASSISTED PARAESOPHAGEAL HERNIA REPAIR, RNFA to assist;  Surgeon: Leafy Ro, MD;  Location: [facility] ORS;  Service: General;  Laterality: N/A;    Family History  Problem  Relation Age of Onset   Emphysema Father    Asthma Father    Heart disease Father    Heart attack Father    Arthritis Father        RA   Colon cancer Father    Hypertension Mother    Breast cancer Neg Hx     Social History:  reports that she has been smoking cigarettes. She has a 20 pack-year smoking history. She has been exposed to tobacco smoke. She has never used smokeless tobacco. She reports that she does not drink alcohol and does not use drugs.  Allergies:  Allergies  Allergen Reactions   Alum & Mag Hydroxide-Simeth Diarrhea and Nausea Only   Bactrim [Sulfamethoxazole-Trimethoprim] Itching   Cefdinir Other (See Comments)    tachycardia   Chantix [Varenicline]     Bad dreams   Chlorhexidine Gluconate Swelling   Doxycycline Other (See Comments)    Nausea, migraine   Fentanyl Nausea And Vomiting   Multaq [Dronedarone] Other (See Comments)    Lip/ mouth numbness/ tongue swelling   Ivp Dye [Iodinated Contrast Media] Palpitations    Medications reviewed.     ROS Full ROS performed and is otherwise negative other than what is stated in the HPI    BP 129/86   Pulse 76   Temp 98 F (36.7 C)   Ht 5\' 3"  (1.6 m)   Wt 134 lb 12.8 oz (61.1 kg)   SpO2 98%   BMI 23.88 kg/m   Physical Exam  CONSTITUTIONAL: NAD EYES: Pupils are equal, round,  Sclera are non-icteric. LYMPH NODES:  Lymph nodes in the neck are normal. RESPIRATORY:  Lungs are clear. There is normal respiratory effort, with equal breath sounds bilaterally, and without pathologic use of accessory muscles. CARDIOVASCULAR: Heart is regular without murmurs, gallops, or rubs. GI: The abdomen is  soft, nontender, and nondistended. There are no palpable masses. There is no hepatosplenomegaly. There are normal bowel sounds. GU: Rectal deferred.   MUSCULOSKELETAL: Normal muscle strength and tone. No cyanosis or edema.   SKIN: Turgor is good and there are no pathologic skin lesions or ulcers. NEUROLOGIC: Motor  and sensation is grossly normal. Cranial nerves are grossly intact. PSYCH:  Oriented to person, place and time. Affect is normal    Assessment/Plan: 57 year old female with a history of paraesophageal hernia repair status post Nissen fundoplication.  Initially with excellent control of acid reflux.  Now she comes back with worsening pulmonary symptoms and recurrent reflux symptoms. Continues to smoke about three quarters of a pack a day.  She does have baseline COPD and I do think that this is a progression of her pulmonary disease.  The question will be if there might be potential a recurrent paraesophageal hernia or not.  We will start with a CT scan of the abdomen and pelvis and go from there.  We can add Carafate for now.  I am not in any rush to perform any surgical intervention. I Spent 30 minutes in this encounter including personally reviewing imaging studies, coordinating her care, placing orders and performing documentation  Sterling Big, MD [facility] Surgeon

## 2022-12-05 NOTE — Telephone Encounter (Signed)
Pt having a COPD Flare Up

## 2022-12-05 NOTE — Telephone Encounter (Signed)
Chief Complaint: Sinus pressure Symptoms: Facial pressure around the eyes and forehead, post nasal drip, nasal congestion, mild cough Frequency: constant onset 3 days ago Pertinent Negatives: Patient denies fever, chills, nausea, vomiting Disposition: [] ED /[] Urgent Care (no appt availability in office) / [] Appointment(In office/virtual)/ []  [place] [facility]/ [x] Home Care/ [] Refused Recommended Disposition /[] [facility] [facility]/ []  Follow-up with PCP Additional Notes: Patient states she has had sinus pressure for 3 days now and the symptoms are not improving with over the counter treatment. Patient states that she has post nasal drip and feels like it is getting down in her chest. Patient is concerned because she has a hx of COPD. Patient reports using a nasal spray, mucinex, inhaler and nebulizer for treatment. Patient reports are grandchildren have similar symptoms as well. Care advice was given and patient is asking for an Rx to be sent to her pharmacy since the things she is trying has not improved her symptoms. Offered patient an appointment but she declined, she only wants to see PCP who does not have availability until next week. Advised I would send request to PCP. Summary: sinus pressure   Pt called in , says has sinus pressure, wants something called in, instead of appt because only wants to see Dr Sherrie Mustache,. He doesn't have anything until Oct 9     Reason for Disposition  [1] Sinus congestion as part of a cold AND [2] present < 10 days  Answer Assessment - Initial Assessment Questions 1. LOCATION: "Where does it hurt?"      All around my forehead and eyes  2. ONSET: "When did the sinus pain start?"  (e.g., hours, days)      3 days ago 3. SEVERITY: "How bad is the pain?"   (Scale 1-10; mild, moderate or severe)   - MILD (1-3): doesn't interfere with normal activities    - MODERATE (4-7): interferes with normal activities (e.g., work or school) or awakens from sleep   - SEVERE  (8-10): excruciating pain and patient unable to do any normal activities        6/10 4. RECURRENT SYMPTOM: "Have you ever had sinus problems before?" If Yes, ask: "When was the last time?" and "What happened that time?"      Yes, I take over the counter medicine 5. NASAL CONGESTION: "Is the nose blocked?" If Yes, ask: "Can you open it or must you breathe through your mouth?"     One is blocked I can open it if I keep blowing my nose  6. NASAL DISCHARGE: "Do you have discharge from your nose?" If so ask, "What color?"     Yes, yellow and green  7. FEVER: "Do you have a fever?" If Yes, ask: "What is it, how was it measured, and when did it start?"      No 8. OTHER SYMPTOMS: "Do you have any other symptoms?" (e.g., sore throat, cough, earache, difficulty breathing)     Post nasal drip, cough  Protocols used: Sinus Pain or Congestion-A-AH

## 2022-12-05 NOTE — Telephone Encounter (Signed)
Symptoms for 3 days. No Fevers, chills or sweats Cough with yellow sputum A little shortness of breath A little wheezing Head is stuffy. Drainage in throat Do you monitor your oxygen at home? Check her O2 and it was 97% No sore throat Covid test was negative  Proventil- QID Ventolin- Once a day Breztri- BID  NOT currently on any Prednisone tablets, just prednisone eye drops  [facility]

## 2022-12-11 ENCOUNTER — Telehealth: Payer: Self-pay | Admitting: Surgery

## 2022-12-11 ENCOUNTER — Ambulatory Visit
Admission: RE | Admit: 2022-12-11 | Discharge: 2022-12-11 | Disposition: A | Discharge status: Home / Self Care | Payer: 59 | Source: Ambulatory Visit | Attending: Surgery | Admitting: Surgery

## 2022-12-11 DIAGNOSIS — R1084 Generalized abdominal pain: Secondary | ICD-10-CM

## 2022-12-11 DIAGNOSIS — K219 Gastro-esophageal reflux disease without esophagitis: Secondary | ICD-10-CM

## 2022-12-11 NOTE — Telephone Encounter (Signed)
Please call patient. She is upset.  Said she was suppose to have CT scan done today.  She was not given allergy prep (says it is documented in her chart she is allergic to contrast).  Needs to reschedule  her CT scan and need allergy prep given or called in.  Please contact her.  Thank you.

## 2022-12-12 ENCOUNTER — Telehealth: Payer: Self-pay | Admitting: Surgery

## 2022-12-12 ENCOUNTER — Other Ambulatory Visit: Payer: Self-pay

## 2022-12-12 DIAGNOSIS — K449 Diaphragmatic hernia without obstruction or gangrene: Secondary | ICD-10-CM

## 2022-12-12 DIAGNOSIS — R1084 Generalized abdominal pain: Secondary | ICD-10-CM

## 2022-12-12 MED ORDER — PREDNISONE 50 MG PO TABS: ORAL_TABLET | ORAL | 0 refills | Status: DC

## 2022-12-12 MED ORDER — DIPHENHYDRAMINE HCL 50 MG PO TABS: ORAL_TABLET | ORAL | 0 refills | Status: DC

## 2022-12-12 NOTE — Telephone Encounter (Signed)
Patient calls now stating she does not want the CT scan to be contrasted.  Requesting non-contrasted now.  Please call her.

## 2022-12-12 NOTE — Telephone Encounter (Signed)
Spoke with the patient and let her know that we rescheduled her CT scan to one with no IV contrast at [facility] on 12/17/22.

## 2022-12-12 NOTE — Telephone Encounter (Signed)
Spoke with the patient and iv allergy protocol has been sent into her pharmacy. She will call to reschedule her CT at her convenience.

## 2022-12-13 ENCOUNTER — Telehealth: Payer: Self-pay | Admitting: Pulmonary Disease

## 2022-12-13 ENCOUNTER — Encounter: Payer: Self-pay | Admitting: Pulmonary Disease

## 2022-12-13 MED ORDER — BREZTRI AEROSPHERE 160-9-4.8 MCG/ACT IN AERO: 2.0000 | INHALATION_SPRAY | Freq: Two times a day (BID) | RESPIRATORY_TRACT | 0 refills | Status: DC

## 2022-12-13 NOTE — Telephone Encounter (Signed)
Samples of Breztri. Pharmacy is out and not sure when they will have more probably mid next week

## 2022-12-13 NOTE — Telephone Encounter (Signed)
See patient message from today.  I have left samples at the front desk for the patient to pick up. The patient is aware.  Nothing further needed.

## 2022-12-15 ENCOUNTER — Other Ambulatory Visit: Payer: Self-pay | Admitting: Family Medicine

## 2022-12-15 DIAGNOSIS — E785 Hyperlipidemia, unspecified: Secondary | ICD-10-CM

## 2022-12-16 NOTE — Telephone Encounter (Signed)
Requested Prescriptions  Pending Prescriptions Disp Refills   atorvastatin (LIPITOR) 20 MG tablet [Pharmacy Med Name: ATORVASTATIN 20 MG TABLET] 90 tablet 0    Sig: TAKE 1 TABLET BY MOUTH EVERY DAY     Cardiovascular:  Antilipid - Statins Failed - 12/15/2022  4:09 PM      Failed - Lipid Panel in normal range within the last 12 months    Cholesterol, Total  Date Value Ref Range Status  01/09/2022 204 (H) 100 - 199 mg/dL Final   LDL Chol Calc (NIH)  Date Value Ref Range Status  01/09/2022 139 (H) 0 - 99 mg/dL Final   HDL  Date Value Ref Range Status  01/09/2022 43 >39 mg/dL Final   Triglycerides  Date Value Ref Range Status  01/09/2022 119 0 - 149 mg/dL Final         Passed - Patient is not pregnant      Passed - Valid encounter within last 12 months    Recent Outpatient Visits           5 months ago Acute recurrent frontal sinusitis   [facility] Malva Limes, MD   6 months ago Iron deficiency   [facility] Malva Limes, MD   7 months ago Other fatigue   [facility] Malva Limes, MD   7 months ago COPD with acute exacerbation [facility])   [facility] Merita Norton T, FNP   10 months ago COPD exacerbation [facility])    [facility] [place], PA-C       Future Appointments             In 1 week Fisher, Demetrios Isaacs, MD [facility], [facility]

## 2022-12-17 ENCOUNTER — Ambulatory Visit: Payer: 59

## 2022-12-17 ENCOUNTER — Ambulatory Visit
Admission: RE | Admit: 2022-12-17 | Discharge: 2022-12-17 | Disposition: A | Discharge status: Home / Self Care | Payer: 59 | Source: Ambulatory Visit | Attending: Surgery | Admitting: Surgery

## 2022-12-17 DIAGNOSIS — K5730 Diverticulosis of large intestine without perforation or abscess without bleeding: Secondary | ICD-10-CM | POA: Diagnosis not present

## 2022-12-17 DIAGNOSIS — K219 Gastro-esophageal reflux disease without esophagitis: Secondary | ICD-10-CM | POA: Diagnosis not present

## 2022-12-17 DIAGNOSIS — R1084 Generalized abdominal pain: Secondary | ICD-10-CM | POA: Diagnosis not present

## 2022-12-17 DIAGNOSIS — K449 Diaphragmatic hernia without obstruction or gangrene: Secondary | ICD-10-CM | POA: Insufficient documentation

## 2022-12-18 ENCOUNTER — Encounter: Payer: Self-pay | Admitting: Surgery

## 2022-12-18 ENCOUNTER — Ambulatory Visit (INDEPENDENT_AMBULATORY_CARE_PROVIDER_SITE_OTHER): Payer: 59 | Admitting: Surgery

## 2022-12-18 VITALS — BP 125/76 | HR 72 | Temp 98.2°F | Ht 63.0 in | Wt 134.0 lb

## 2022-12-18 DIAGNOSIS — K30 Functional dyspepsia: Secondary | ICD-10-CM

## 2022-12-18 DIAGNOSIS — J449 Chronic obstructive pulmonary disease, unspecified: Secondary | ICD-10-CM | POA: Diagnosis not present

## 2022-12-18 DIAGNOSIS — F17210 Nicotine dependence, cigarettes, uncomplicated: Secondary | ICD-10-CM

## 2022-12-18 MED ORDER — METOCLOPRAMIDE HCL 5 MG PO TABS: 5.0000 mg | ORAL_TABLET | Freq: Three times a day (TID) | ORAL | 1 refills | Status: DC

## 2022-12-18 NOTE — Patient Instructions (Signed)
We have sent in a prescription for Reglan to be taken three times a day.   We will get you set up for a gastric emptying study. We will call you about this.   Follow up here in 1 month.

## 2022-12-19 ENCOUNTER — Other Ambulatory Visit: Payer: Self-pay

## 2022-12-19 DIAGNOSIS — K3184 Gastroparesis: Secondary | ICD-10-CM

## 2022-12-19 DIAGNOSIS — K219 Gastro-esophageal reflux disease without esophagitis: Secondary | ICD-10-CM

## 2022-12-19 NOTE — Progress Notes (Signed)
Outpatient Surgical Follow Up   Andrea [name] is an 57 y.o. female.   Chief Complaint  Patient presents with   Follow-up    HPI:  57 yo  female seen in F/U after paraesophageal hernia repair 6 months ago.  Initially she had great results after surgery and most of her pulmonary symptoms improved.  She does have some dyspnea on exertion due to chronic COPD.  She continues to smoke about three quarters of a pack a day.  Personally reviewed the recent CT scan showing no evidence of paraesophageal hernia recurrence.  Wrap is intact.  I do notice distention of the stomach.  No other acute issues SHe now endorses recurrent reflux.  She did have barium swallow that I have personally reviewed showing evidence of some reflux.  No evidence of recurrence , no obvious paraesophageal defects   Past Medical History:  Diagnosis Date   Anxiety    panic attacks, chest pain   Arthritis    COPD (chronic obstructive pulmonary disease) (HCC)    COVID    2023   Dyspnea    GERD (gastroesophageal reflux disease)    History of hiatal hernia    Hypertension    not medicated   Non-obstructive CAD in native artery    a. cardiac cath 01/2014: showed minor irregularities, mildly elevated left ventricular end-diastolic pressure and normal ejection fraction; b. 03/2015 MV: low risk.   Palpitations    a. 08/2016 Event Monitor: occas PVC's, two brief runs of SVT (4 and 7 beats).   PONV (postoperative nausea and vomiting)    PONV (postoperative nausea and vomiting)    Stage 2 moderate COPD by GOLD classification (HCC)    Symptomatic PVC's (premature ventricular contractions)    Tobacco abuse    Wears dentures    partial upper and lower    Past Surgical History:  Procedure Laterality Date   CARDIAC CATHETERIZATION  01/03/14   CARDIAC CATHETERIZATION     CATARACT EXTRACTION W/PHACO Left 11/27/2022   Procedure: CATARACT EXTRACTION PHACO AND INTRAOCULAR LENS PLACEMENT (IOC) LEFT MALYUGIN  3.59  00:31.6;   Surgeon: Lockie Mola, MD;  Location: [facility];  Service: Ophthalmology;  Laterality: Left;   CHOLECYSTECTOMY     COLONOSCOPY WITH PROPOFOL N/A 10/05/2019   Procedure: COLONOSCOPY WITH PROPOFOL;  Surgeon: Midge Minium, MD;  Location: [facility] ENDOSCOPY;  Service: Endoscopy;  Laterality: N/A;   ESOPHAGOGASTRODUODENOSCOPY (EGD) WITH PROPOFOL N/A 07/11/2016   Procedure: ESOPHAGOGASTRODUODENOSCOPY (EGD) WITH PROPOFOL;  Surgeon: Midge Minium, MD;  Location: [facility];  Service: Endoscopy;  Laterality: N/A;   ESOPHAGOGASTRODUODENOSCOPY (EGD) WITH PROPOFOL N/A 10/05/2019   Procedure: ESOPHAGOGASTRODUODENOSCOPY (EGD) WITH PROPOFOL;  Surgeon: Midge Minium, MD;  Location: [facility] ENDOSCOPY;  Service: Endoscopy;  Laterality: N/A;   ESOPHAGOGASTRODUODENOSCOPY (EGD) WITH PROPOFOL N/A 02/21/2022   Procedure: ESOPHAGOGASTRODUODENOSCOPY (EGD) WITH PROPOFOL;  Surgeon: Midge Minium, MD;  Location: [facility] ENDOSCOPY;  Service: Endoscopy;  Laterality: N/A;   EYE SURGERY     IMAGE GUIDED SINUS SURGERY N/A 11/30/2014   Procedure: IMAGE GUIDED SINUS SURGERY;  Surgeon: Bud Face, MD;  Location: [facility];  Service: ENT;  Laterality: N/A;  GAVE DISK TO CE CE   INSERTION OF MESH  03/28/2022   Procedure: INSERTION OF MESH;  Surgeon: Leafy Ro, MD;  Location: [facility] ORS;  Service: General;;   MAXILLARY ANTROSTOMY Bilateral 11/30/2014   Procedure: MAXILLARY ANTROSTOMY;  Surgeon: Bud Face, MD;  Location: [facility];  Service: ENT;  Laterality: Bilateral;   SEPTOPLASTY  N/A 11/30/2014   Procedure: SEPTOPLASTY;  Surgeon: Bud Face, MD;  Location: [facility];  Service: ENT;  Laterality: N/A;   SPHENOIDECTOMY Left 11/30/2014   Procedure: Selina Cooley, ;  Surgeon: Bud Face, MD;  Location: [facility];  Service: ENT;  Laterality: Left;   VAGINAL HYSTERECTOMY  1995   vaginal   XI ROBOTIC ASSISTED PARAESOPHAGEAL HERNIA REPAIR N/A 03/28/2022   Procedure:  XI ROBOTIC ASSISTED PARAESOPHAGEAL HERNIA REPAIR, RNFA to assist;  Surgeon: Leafy Ro, MD;  Location: [facility] ORS;  Service: General;  Laterality: N/A;    Family History  Problem Relation Age of Onset   Emphysema Father    Asthma Father    Heart disease Father    Heart attack Father    Arthritis Father        RA   Colon cancer Father    Hypertension Mother    Breast cancer Neg Hx     Social History:  reports that she has been smoking cigarettes. She has a 20 pack-year smoking history. She has been exposed to tobacco smoke. She has never used smokeless tobacco. She reports that she does not drink alcohol and does not use drugs.  Allergies:  Allergies  Allergen Reactions   Alum & Mag Hydroxide-Simeth Diarrhea and Nausea Only   Bactrim [Sulfamethoxazole-Trimethoprim] Itching   Cefdinir Other (See Comments)    tachycardia   Chantix [Varenicline]     Bad dreams   Chlorhexidine Gluconate Swelling   Doxycycline Other (See Comments)    Nausea, migraine   Fentanyl Nausea And Vomiting   Multaq [Dronedarone] Other (See Comments)    Lip/ mouth numbness/ tongue swelling   Ivp Dye [Iodinated Contrast Media] Palpitations    Medications reviewed.    ROS Full ROS performed and is otherwise negative other than what is stated in HPI   BP 125/76   Pulse 72   Temp 98.2 F (36.8 C)   Ht 5\' 3"  (1.6 m)   Wt 134 lb (60.8 kg)   SpO2 99%   BMI 23.74 kg/m   Physical Exam RESPIRATORY:  Lungs are clear. There is normal respiratory effort, with equal breath sounds bilaterally some ronchi , and without pathologic use of accessory muscles. CARDIOVASCULAR: Heart is regular without murmurs, gallops, or rubs. GI: The abdomen is  soft, nontender, and nondistended. There are no palpable masses.  Scars w/o issues.There is no hepatosplenomegaly. There are normal bowel sounds. GU: Rectal deferred.   MUSCULOSKELETAL: Normal muscle strength and tone. No cyanosis or edema.   SKIN: Turgor is good  and there are no pathologic skin lesions or ulcers. NEUROLOGIC: Motor and sensation is grossly normal. Cranial nerves are grossly intact. PSYCH:  Oriented to person, place and time. Affect is normal     No results found for this or any previous visit (from the past 48 hour(s)). CT ABDOMEN PELVIS WO CONTRAST  Result Date: 12/18/2022 CLINICAL DATA:  Hernia suspected.  Previous Nissen procedure. EXAM: CT ABDOMEN AND PELVIS WITHOUT CONTRAST TECHNIQUE: Multidetector CT imaging of the abdomen and pelvis was performed following the standard protocol without IV contrast. RADIATION DOSE REDUCTION: This exam was performed according to the departmental dose-optimization program which includes automated exposure control, adjustment of the mA and/or kV according to patient size and/or use of iterative reconstruction technique. COMPARISON:  02/13/2022 FINDINGS: Lower chest: Lung bases are clear.  No hiatal hernia visible. Hepatobiliary: Liver parenchyma appears normal without contrast. Previous cholecystectomy. Pancreas: Normal Spleen: Normal Adrenals/Urinary Tract: Adrenal glands are normal.  No significant renal finding. No stone or hydronephrosis. Small fat density angiomyolipoma at the lower pole on the left as seen previously. Bladder appears normal. Stomach/Bowel: No hiatal hernia as noted above. The stomach is full of ingested material. No small bowel abnormality. Normal appendix. Diverticulosis of the sigmoid colon without visible diverticulitis. No abdominal wall hernia. Vascular/Lymphatic: Aortic atherosclerosis. No aneurysm. IVC is normal. No adenopathy. Reproductive: Previous hysterectomy.  No pelvic mass. Other: No free fluid or air. Musculoskeletal: Ordinary mild degenerative changes affect the spine. IMPRESSION: 1. No acute finding by CT. No hiatal hernia. 2. Previous cholecystectomy and hysterectomy. 3. Aortic atherosclerosis. 4. Sigmoid diverticulosis without evidence of diverticulitis. Aortic  Atherosclerosis (ICD10-I70.0). Electronically Signed   By: Paulina Fusi M.D.   On: 12/18/2022 11:04    Assessment/Plan: 57 year old female with a history of paraesophageal hernia repair status post Nissen fundoplication.  She Continues to smoke about three quarters of a pack a day.  She does have baseline COPD and I do think that this is a progression of her pulmonary disease.   CT scan shows an intact wrap with an intact paraesophageal hernia repair. Evidence of some gastric distention. With the patient in detail that I do not think she will benefit for any further surgical intervention.  I do think this is more of a functional GI issue and I am wondering if she may have some gastric content issues.  Will order gastric emptying test and we will follow her up as well. May try empiric reglan for short period I Spent 30 minutes in this encounter including personally reviewing imaging studies, coordinating her care, placing orders and performing documentation  Sterling Big, MD [facility] General Surgeon

## 2022-12-23 DIAGNOSIS — J9621 Acute and chronic respiratory failure with hypoxia: Secondary | ICD-10-CM | POA: Diagnosis not present

## 2022-12-27 ENCOUNTER — Ambulatory Visit (INDEPENDENT_AMBULATORY_CARE_PROVIDER_SITE_OTHER): Payer: 59 | Admitting: Family Medicine

## 2022-12-27 VITALS — BP 121/71 | HR 73 | Ht 62.0 in | Wt 135.0 lb

## 2022-12-27 DIAGNOSIS — F410 Panic disorder [episodic paroxysmal anxiety] without agoraphobia: Secondary | ICD-10-CM

## 2022-12-27 DIAGNOSIS — Z0001 Encounter for general adult medical examination with abnormal findings: Secondary | ICD-10-CM | POA: Diagnosis not present

## 2022-12-27 DIAGNOSIS — E785 Hyperlipidemia, unspecified: Secondary | ICD-10-CM

## 2022-12-27 DIAGNOSIS — G2581 Restless legs syndrome: Secondary | ICD-10-CM | POA: Diagnosis not present

## 2022-12-27 DIAGNOSIS — Z0000 Encounter for general adult medical examination without abnormal findings: Secondary | ICD-10-CM

## 2022-12-27 DIAGNOSIS — E611 Iron deficiency: Secondary | ICD-10-CM

## 2022-12-27 DIAGNOSIS — R252 Cramp and spasm: Secondary | ICD-10-CM

## 2022-12-27 DIAGNOSIS — H2012 Chronic iridocyclitis, left eye: Secondary | ICD-10-CM | POA: Diagnosis not present

## 2022-12-27 DIAGNOSIS — M797 Fibromyalgia: Secondary | ICD-10-CM | POA: Diagnosis not present

## 2022-12-27 DIAGNOSIS — L930 Discoid lupus erythematosus: Secondary | ICD-10-CM

## 2022-12-27 DIAGNOSIS — K140 Glossitis: Secondary | ICD-10-CM

## 2022-12-27 DIAGNOSIS — F3289 Other specified depressive episodes: Secondary | ICD-10-CM

## 2022-12-28 LAB — CBC
Hematocrit: 44.9 % (ref 34.0–46.6)
Hemoglobin: 14.5 g/dL (ref 11.1–15.9)
MCH: 27.8 pg (ref 26.6–33.0)
MCHC: 32.3 g/dL (ref 31.5–35.7)
MCV: 86 fL (ref 79–97)
Platelets: 322 10*3/uL (ref 150–450)
RBC: 5.22 x10E6/uL (ref 3.77–5.28)
RDW: 13.9 % (ref 11.7–15.4)
WBC: 8.2 10*3/uL (ref 3.4–10.8)

## 2022-12-28 LAB — IRON,TIBC AND FERRITIN PANEL
Ferritin: 34 ng/mL (ref 15–150)
Iron Saturation: 17 % (ref 15–55)
Iron: 66 ug/dL (ref 27–159)
Total Iron Binding Capacity: 392 ug/dL (ref 250–450)
UIBC: 326 ug/dL (ref 131–425)

## 2022-12-28 LAB — LIPID PANEL
Chol/HDL Ratio: 4.1 ratio (ref 0.0–4.4)
Cholesterol, Total: 192 mg/dL (ref 100–199)
HDL: 47 mg/dL (ref >39)
LDL Chol Calc (NIH): 127 mg/dL — ABNORMAL HIGH (ref 0–99)
Triglycerides: 102 mg/dL (ref 0–149)
VLDL Cholesterol Cal: 18 mg/dL (ref 5–40)

## 2022-12-28 LAB — COMPREHENSIVE METABOLIC PANEL
ALT: 10 [IU]/L (ref 0–32)
AST: 15 [IU]/L (ref 0–40)
Albumin: 4.3 g/dL (ref 3.8–4.9)
Alkaline Phosphatase: 91 [IU]/L (ref 44–121)
BUN/Creatinine Ratio: 21 (ref 9–23)
BUN: 16 mg/dL (ref 6–24)
Bilirubin Total: 0.4 mg/dL (ref 0.0–1.2)
CO2: 25 mmol/L (ref 20–29)
Calcium: 9.9 mg/dL (ref 8.7–10.2)
Chloride: 103 mmol/L (ref 96–106)
Creatinine, Ser: 0.76 mg/dL (ref 0.57–1.00)
Globulin, Total: 2.8 g/dL (ref 1.5–4.5)
Glucose: 72 mg/dL (ref 70–99)
Potassium: 4.9 mmol/L (ref 3.5–5.2)
Sodium: 140 mmol/L (ref 134–144)
Total Protein: 7.1 g/dL (ref 6.0–8.5)
eGFR: 90 mL/min/{1.73_m2} (ref >59)

## 2022-12-28 LAB — VITAMIN B12: Vitamin B-12: 459 pg/mL (ref 232–1245)

## 2022-12-28 LAB — CK: Total CK: 39 U/L (ref 32–182)

## 2022-12-30 ENCOUNTER — Encounter: Payer: Self-pay | Admitting: Family Medicine

## 2022-12-30 MED ORDER — AMOXICILLIN 500 MG PO CAPS: 1000.0000 mg | ORAL_CAPSULE | Freq: Two times a day (BID) | ORAL | 0 refills | Status: AC

## 2023-01-10 ENCOUNTER — Other Ambulatory Visit: Payer: 59

## 2023-01-18 ENCOUNTER — Telehealth: Payer: 59 | Admitting: Nurse Practitioner

## 2023-01-18 DIAGNOSIS — J441 Chronic obstructive pulmonary disease with (acute) exacerbation: Secondary | ICD-10-CM

## 2023-01-18 MED ORDER — PREDNISONE 10 MG PO TABS
ORAL_TABLET | ORAL | 0 refills | Status: DC
Start: 2023-01-18 — End: 2023-01-27

## 2023-01-18 NOTE — Progress Notes (Signed)
I have spent 5 minutes in review of e-visit questionnaire, review and updating patient chart, medical decision making and response to patient.  ° °Jerrell Mangel W Secilia Apps, NP ° °  °

## 2023-01-18 NOTE — Progress Notes (Signed)
With the recent use of antibiotics just recently I would only be able to fill prednisone today. You would need to reach out to Dr. Sherrie Mustache  on Monday if you feel antibiotics are warranted.   E-Visit for Cough   We are sorry that you are not feeling well.  Here is how we plan to help!   Prednisone 10 mg daily for 6 days (see taper instructions below)  Directions for 6 day taper: Day 1: 2 tablets before breakfast, 1 after both lunch & dinner and 2 at bedtime Day 2: 1 tab before breakfast, 1 after both lunch & dinner and 2 at bedtime Day 3: 1 tab at each meal & 1 at bedtime Day 4: 1 tab at breakfast, 1 at lunch, 1 at bedtime Day 5: 1 tab at breakfast & 1 tab at bedtime Day 6: 1 tab at breakfast  From your responses in the eVisit questionnaire you describe inflammation in the upper respiratory tract which is causing a significant cough.  This is commonly called Bronchitis and has four common causes:   Allergies Viral Infections Acid Reflux Bacterial Infection Allergies, viruses and acid reflux are treated by controlling symptoms or eliminating the cause. An example might be a cough caused by taking certain blood pressure medications. You stop the cough by changing the medication. Another example might be a cough caused by acid reflux. Controlling the reflux helps control the cough.  USE OF BRONCHODILATOR ("RESCUE") INHALERS: There is a risk from using your bronchodilator too frequently.  The risk is that over-reliance on a medication which only relaxes the muscles surrounding the breathing tubes can reduce the effectiveness of medications prescribed to reduce swelling and congestion of the tubes themselves.  Although you feel brief relief from the bronchodilator inhaler, your asthma may actually be worsening with the tubes becoming more swollen and filled with mucus.  This can delay other crucial treatments, such as oral steroid medications. If you need to use a bronchodilator inhaler daily,  several times per day, you should discuss this with your provider.  There are probably better treatments that could be used to keep your asthma under control.     HOME CARE Only take medications as instructed by your medical team. Complete the entire course of an antibiotic. Drink plenty of fluids and get plenty of rest. Avoid close contacts especially the very young and the elderly Cover your mouth if you cough or cough into your sleeve. Always remember to wash your hands A steam or ultrasonic humidifier can help congestion.   GET HELP RIGHT AWAY IF: You develop worsening fever. You become short of breath You cough up blood. Your symptoms persist after you have completed your treatment plan MAKE SURE YOU  Understand these instructions. Will watch your condition. Will get help right away if you are not doing well or get worse.    Thank you for choosing an e-visit.  Your e-visit answers were reviewed by a board certified advanced clinical practitioner to complete your personal care plan. Depending upon the condition, your plan could have included both over the counter or prescription medications.  Please review your pharmacy choice. Make sure the pharmacy is open so you can pick up prescription now. If there is a problem, you may contact your provider through [employer] and have the prescription routed to another pharmacy.  Your safety is important to [place]. If you have drug allergies check your prescription carefully.   For the next 24 hours you can use  MyChart to ask questions about today's visit, request a non-urgent call back, or ask for a work or school excuse. You will get an email in the next two days asking about your experience. I hope that your e-visit has been valuable and will speed your recovery.

## 2023-01-21 ENCOUNTER — Encounter: Payer: Self-pay | Admitting: Pulmonary Disease

## 2023-01-21 MED ORDER — AZITHROMYCIN 250 MG PO TABS: ORAL_TABLET | ORAL | 0 refills | Status: AC

## 2023-01-21 NOTE — Telephone Encounter (Signed)
May call and in an azithromycin Z-Pak.

## 2023-01-22 ENCOUNTER — Ambulatory Visit: Payer: 59 | Admitting: Surgery

## 2023-01-23 DIAGNOSIS — J9621 Acute and chronic respiratory failure with hypoxia: Secondary | ICD-10-CM | POA: Diagnosis not present

## 2023-01-26 ENCOUNTER — Other Ambulatory Visit: Payer: Self-pay | Admitting: Family Medicine

## 2023-01-26 DIAGNOSIS — E785 Hyperlipidemia, unspecified: Secondary | ICD-10-CM

## 2023-01-27 ENCOUNTER — Encounter: Payer: Self-pay | Admitting: Family Medicine

## 2023-01-27 ENCOUNTER — Encounter: Payer: Self-pay | Admitting: Gastroenterology

## 2023-01-27 ENCOUNTER — Other Ambulatory Visit: Payer: Self-pay

## 2023-01-27 ENCOUNTER — Ambulatory Visit: Payer: 59 | Admitting: Pulmonary Disease

## 2023-01-27 ENCOUNTER — Encounter: Payer: Self-pay | Admitting: Pulmonary Disease

## 2023-01-27 VITALS — BP 120/80 | HR 65 | Temp 97.3°F | Ht 62.0 in | Wt 135.0 lb

## 2023-01-27 DIAGNOSIS — K219 Gastro-esophageal reflux disease without esophagitis: Secondary | ICD-10-CM

## 2023-01-27 DIAGNOSIS — F17210 Nicotine dependence, cigarettes, uncomplicated: Secondary | ICD-10-CM

## 2023-01-27 DIAGNOSIS — Z91018 Allergy to other foods: Secondary | ICD-10-CM

## 2023-01-27 DIAGNOSIS — R0602 Shortness of breath: Secondary | ICD-10-CM

## 2023-01-27 DIAGNOSIS — G4736 Sleep related hypoventilation in conditions classified elsewhere: Secondary | ICD-10-CM | POA: Diagnosis not present

## 2023-01-27 DIAGNOSIS — J449 Chronic obstructive pulmonary disease, unspecified: Secondary | ICD-10-CM | POA: Diagnosis not present

## 2023-01-27 DIAGNOSIS — J4489 Other specified chronic obstructive pulmonary disease: Secondary | ICD-10-CM

## 2023-01-27 DIAGNOSIS — E785 Hyperlipidemia, unspecified: Secondary | ICD-10-CM

## 2023-01-27 MED ORDER — BREZTRI AEROSPHERE 160-9-4.8 MCG/ACT IN AERO
2.0000 | INHALATION_SPRAY | Freq: Two times a day (BID) | RESPIRATORY_TRACT | 11 refills | Status: DC
Start: 2023-02-10 — End: 2023-04-17

## 2023-01-27 MED ORDER — BREZTRI AEROSPHERE 160-9-4.8 MCG/ACT IN AERO: 2.0000 | INHALATION_SPRAY | Freq: Two times a day (BID) | RESPIRATORY_TRACT | 0 refills | Status: DC

## 2023-01-27 MED ORDER — DEXLANSOPRAZOLE 60 MG PO CPDR: 60.0000 mg | DELAYED_RELEASE_CAPSULE | Freq: Every day | ORAL | 2 refills | Status: DC

## 2023-01-27 MED ORDER — ALBUTEROL SULFATE HFA 108 (90 BASE) MCG/ACT IN AERS: 2.0000 | INHALATION_SPRAY | Freq: Four times a day (QID) | RESPIRATORY_TRACT | 6 refills | Status: DC | PRN

## 2023-01-27 MED ORDER — ATORVASTATIN CALCIUM 40 MG PO TABS
40.0000 mg | ORAL_TABLET | Freq: Every day | ORAL | 1 refills | Status: DC
Start: 2023-01-27 — End: 2023-04-17

## 2023-01-27 NOTE — Progress Notes (Unsigned)
Subjective:    Patient ID: Andrea [name], female    DOB: 1966/01/25, 57 y.o.   MRN: [id number]  Patient Care Team: Malva Limes, MD as PCP - General (Family Medicine) Salena Saner, MD as Consulting Physician (Pulmonary Disease)  Chief Complaint  Patient presents with   Follow-up    Little DOE. No wheezing. Cough with clear sputum. 1L at bedtime.    BACKGROUND/INTERVAL:Andrea [name] is a 57 year old current smoker (half PPD, 20 PY) with stage II COPD who presents for follow-up on the same. This is a scheduled visit.  He was last seen 09 October 2022.  Had a recent exacerbation for which she was started on prednisone taper and azithromycin on 21 January 2023.  HPI Discussed the use of AI scribe software for clinical note transcription with the patient, who gave verbal consent to proceed.  History of Present Illness   The patient, diagnosed with stage 2-3 COPD, reports improvement with regards to her exacerbation following a recent course of azithromycin and prednisone. She acknowledges ongoing smoking, albeit reduced, and is considering strategies for cessation. She confirms the need for refills on her albuterol and Breztri prescriptions.  The patient also reports persistent issues with gastroesophageal reflux, which exacerbates her shortness of breath, particularly post meals. Despite ongoing heartburn, she is hesitant about a proposed four-hour test (gastric emptying) for further evaluation. She has noticed some relief with smaller, more frequent meals and avoidance of salty foods.  Recall that she had to have a sizable paraesophageal hernia repair performed on 28 March 2022.  Interestingly, the patient has identified a potential food allergy, experiencing adverse reactions after consuming bananas. This has prompted consideration for allergy testing. She also expresses concerns about the availability of her Breztri medication, often having to wait for the pharmacy to order it  in.  She is on nocturnal oxygen supplementation at 1 lpm and is compliant. Notes benefit of therapy.     DATA 06/18/2016 PFTs: FEV1 1.45 L or 60% predicted VC 2.38 L or 90% predicted FEV1/FVC 61%.  Lung volumes: TLC 102%, RV 140%. DLCO 57%.  No significant bronchodilator response. 09/25/2016 2D echo: LVEF 60 to 65%, normal study. 09/01/2020 overnight oximetry: No significant desaturations. 01/15/2021 PFTs: FEV1 1.46 L or 57% predicted, FVC 2.31 L or 70% predicted, FEV1/FVC 63%, no bronchodilator response. TLC 113, RV 200% diffusion capacity moderately reduced. 06/11/2021 echocardiogram: LVEF 60 to 65%, no regional wall motion abnormality, diastolic send normal, right heart function normal.  No valvular abnormalities.  Review of Systems A 10 point review of systems was performed and it is as noted above otherwise negative.   Patient Active Problem List   Diagnosis Date Noted   Hiatal hernia 03/28/2022   S/P repair of paraesophageal hernia 03/28/2022   Chronic anterior uveitis of left eye 10/04/2021   History of colonic polyps    Varicose veins of bilateral lower extremities with pain 11/22/2016   Restless leg 11/18/2016   Iron deficiency 11/18/2016   Allergic rhinitis 06/11/2016   Hyperlipidemia 03/19/2015   COPD (chronic obstructive pulmonary disease) (HCC) 10/04/2014   Depression 09/22/2014   Dyshidrosis 09/22/2014   GERD (gastroesophageal reflux disease) 09/22/2014   Insomnia 09/22/2014   Lung nodule, multiple 09/22/2014   Panic disorder 09/22/2014   Palpitations 01/17/2014   Pulmonary nodule 06/10/2013   Shortness of breath 05/20/2013   COPD, GOLD B 05/20/2013   Tobacco abuse 05/20/2013   Daytime somnolence 05/20/2013   Back pain 08/28/2012   Discoid lupus 05/30/2012  Pain in joint 03/09/2012   Psoriasis 02/21/2012   Fibromyalgia 02/11/2012   Lupus 02/11/2012   Panic attacks 02/11/2012   History of adenomatous polyp of colon 09/13/2011   Heartburn 08/06/2011   PVC's  (premature ventricular contractions) 01/29/2011   Essential (primary) hypertension 03/04/1998    Social History   Tobacco Use   Smoking status: Every Day    Current packs/day: 1.00    Average packs/day: 1 pack/day for 20.0 years (20.0 ttl pk-yrs)    Types: Cigarettes    Passive exposure: Past   Smokeless tobacco: Never   Tobacco comments:    0.5 PPD -10/09/2022 khj  Substance Use Topics   Alcohol use: No    Alcohol/week: 0.0 standard drinks of alcohol    Allergies  Allergen Reactions   Alum & Mag Hydroxide-Simeth Diarrhea and Nausea Only   Bactrim [Sulfamethoxazole-Trimethoprim] Itching   Cefdinir Other (See Comments)    tachycardia   Chantix [Varenicline]     Bad dreams   Chlorhexidine Gluconate Swelling   Doxycycline Other (See Comments)    Nausea, migraine   Fentanyl Nausea And Vomiting   Multaq [Dronedarone] Other (See Comments)    Lip/ mouth numbness/ tongue swelling   Ivp Dye [Iodinated Contrast Media] Palpitations    Current Meds  Medication Sig   albuterol (PROVENTIL) (2.5 MG/3ML) 0.083% nebulizer solution USE 1 VIAL IN NEBULIZER EVERY 4 (FOUR) HOURS AS NEEDED FOR WHEEZING OR SHORTNESS OF BREATH.   atorvastatin (LIPITOR) 40 MG tablet Take 1 tablet (40 mg total) by mouth daily.   Budeson-Glycopyrrol-Formoterol (BREZTRI AEROSPHERE) 160-9-4.8 MCG/ACT AERO Inhale 2 puffs into the lungs in the morning and at bedtime.   Budeson-Glycopyrrol-Formoterol (BREZTRI AEROSPHERE) 160-9-4.8 MCG/ACT AERO Inhale 2 puffs into the lungs in the morning and at bedtime.   [START ON 02/10/2023] Budeson-Glycopyrrol-Formoterol (BREZTRI AEROSPHERE) 160-9-4.8 MCG/ACT AERO Inhale 2 puffs into the lungs in the morning and at bedtime.   clonazePAM (KLONOPIN) 1 MG tablet TAKE 1 TABLET BY MOUTH 2 TIMES DAILY AS NEEDED. DNF UNTIL 11/3   fluticasone (FLONASE) 50 MCG/ACT nasal spray Place 2 sprays into both nostrils as needed.   hydroxychloroquine (PLAQUENIL) 200 MG tablet TAKE 2 TABLETS (400 MG  TOTAL) BY MOUTH DAILY FOR DISCOID LUPUS (L93.0)   Misc. Devices (PULSE OXIMETER DELUXE) MISC Use daily to monitor oxygen levels   Nebulizers (COMPRESSOR/NEBULIZER) MISC Supplies only   Nebulizers (COMPRESSOR/NEBULIZER) MISC Use as directed   nitroGLYCERIN (NITROSTAT) 0.4 MG SL tablet Place 1 tablet (0.4 mg total) under the tongue every 5 (five) minutes as needed for chest pain.   OXYGEN Inhale 2 L into the lungs as needed (at while sleeping).   oxymetazoline (AFRIN NASAL SPRAY) 0.05 % nasal spray 2 sprays per nare for acute nasal bleeding/epistaxis   prednisoLONE acetate (PRED FORTE) 1 % ophthalmic suspension PLACE 1 DROP INTO THE LEFT EYE 4 (FOUR) TIMES DAILY FOR AT LEAST TWO DAYS AND CONTINUE UNTIL SYMPTOMS IMPROVE, ONCE SYMPTOMS IMPROVE PLEASE TAPER DOSING, 3 TIMES DAY, TWICE DAILY, ONCE DAILY. DO NOT TAKE WITH YOUR OTHER EYE DROP -DUREZOL   Respiratory Therapy Supplies (FLUTTER) DEVI 1 Device by Does not apply route daily.   Spacer/Aero-Holding Chambers (AEROCHAMBER MV) inhaler Use as instructed   Spacer/Aero-Holding Chambers (AEROCHAMBER MV) inhaler Use as instructed   [DISCONTINUED] albuterol (VENTOLIN HFA) 108 (90 Base) MCG/ACT inhaler INHALE 2 PUFFS BY MOUTH EVERY 6 HOURS AS NEEDED FOR WHEEZE OR SHORTNESS OF BREATH   [DISCONTINUED] dexlansoprazole (DEXILANT) 60 MG capsule TAKE 1 CAPSULE BY MOUTH EVERY  DAY    Immunization History  Administered Date(s) Administered   Moderna Sars-Covid-2 Vaccination 11/28/2019, 12/26/2019   Pneumococcal Conjugate-13 11/20/2012   Pneumococcal Polysaccharide-23 08/10/2012        Objective:   BP 120/80 (BP Location: Right Arm, Cuff Size: Normal)   Pulse 65   Temp (!) 97.3 F (36.3 C)   Ht 5\' 2"  (1.575 m)   Wt 135 lb (61.2 kg)   SpO2 97%   BMI 24.69 kg/m   SpO2: 97 % O2 Device: None (Room air)  GENERAL: Awake, alert, fully ambulatory.  In no respiratory distress.  No conversational dyspnea.   HEAD: Normocephalic, atraumatic.  EYES: Pupils  equal, round, reactive to light.  No scleral icterus.  MOUTH: Dentures upper and lower. NECK: Supple. No thyromegaly. Trachea midline. No JVD.  No adenopathy. PULMONARY: Good air entry bilaterally.  There are coarse breath sounds throughout, no wheezes. CARDIOVASCULAR: S1 and S2. Regular rate and rhythm.  No rubs, murmurs or gallops heard. ABDOMEN: Benign. MUSCULOSKELETAL: No joint deformity, no clubbing, no edema.  NEUROLOGIC: No overt focal deficit.  Speech is fluent.  No gait disturbance. SKIN: Intact,warm,dry. PSYCH: Normal mood and behavior.       Assessment & Plan:     ICD-10-CM   1. Stage 2 moderate COPD by GOLD classification (HCC)  J44.9 Pulmonary Function Test [facility] Only    2. Nocturnal hypoxemia due to obstructive chronic bronchitis (HCC)  J44.89    G47.36     3. Shortness of breath  R06.02 Pulmonary Function Test [facility] Only    4. Chronic GERD  K21.9     5. Multiple food allergies  Z91.018 Food Allergy Profile    6. Moderate smoker (20 or less per day)  F17.210       Orders Placed This Encounter  Procedures   Food Allergy Profile    Standing Status:   Future    Number of Occurrences:   1    Standing Expiration Date:   05/27/2023   Pulmonary Function Test [facility] Only    Standing Status:   Future    Standing Expiration Date:   01/27/2024    Order Specific Question:   Full PFT: includes the following: basic spirometry, spirometry pre & post bronchodilator, diffusion capacity (DLCO), lung volumes    Answer:   Full PFT    Order Specific Question:   This test can only be performed at    Answer:   [facility]    Meds ordered this encounter  Medications   Budeson-Glycopyrrol-Formoterol (BREZTRI AEROSPHERE) 160-9-4.8 MCG/ACT AERO    Sig: Inhale 2 puffs into the lungs in the morning and at bedtime.    Dispense:  11.8 g    Refill:  0    Order Specific Question:   Lot Number?    Answer:   [id number]    Order Specific Question:   Expiration Date?    Answer:    04/04/2025    Order Specific Question:   Manufacturer?    Answer:   [employer] [[id number]]    Order Specific Question:   Quantity    Answer:   2   Budeson-Glycopyrrol-Formoterol (BREZTRI AEROSPHERE) 160-9-4.8 MCG/ACT AERO    Sig: Inhale 2 puffs into the lungs in the morning and at bedtime.    Dispense:  10.7 g    Refill:  11   albuterol (VENTOLIN HFA) 108 (90 Base) MCG/ACT inhaler    Sig: Inhale 2 puffs into the lungs every 6 (six) hours as needed  for wheezing or shortness of breath.    Dispense:  8.5 each    Refill:  6   Succussion:    Chronic Obstructive Pulmonary Disease (COPD) Stage II-III COPD. Improved with azithromycin and prednisone. No wheezing on examination. Continues to smoke, exacerbating symptoms. Discussed smoking cessation benefits, including improved lung function and reduced exacerbations. Prefers gradual cessation. - Refill albuterol inhaler - Refill Breztri inhaler - Provide Breztri samples - Schedule repeat pulmonary function tests - Follow up in three months   Nocturnal Hypoxemia - Compliant with O2 @ 1 LPM q hs - Notes benefit of therapy  Gastroesophageal Reflux Disease (GERD) Persistent postprandial reflux and heartburn with shortness of breath when eating. Advised smaller, frequent meals and avoiding salty foods, showing some improvement. Discussed four-hour impedance test benefits. - Refer for four-hour impedance test  Food Allergy Possible allergies to bananas and strawberries with cross-reactivity to avocado, kiwi, chestnut, persimmons, plum, tomato, and zucchini. Discussed potential latex sensitivity. Explained allergist skin testing process. - Refer to allergist for skin testing  General Health Maintenance Has not received flu shot due to fear. Discussed vaccination importance and benefits, including reduced influenza-related complications. - Encourage flu vaccination   Tobacco Dependence Due to Cigarettes - Patient counselled with regards to smoking  cessation - Total counselling time: 3- 5 minutes  Follow-up - Follow up in three months.     Gailen Shelter, MD Advanced Bronchoscopy PCCM [facility]    *This note was generated using voice recognition software/Dragon and/or AI transcription program.  Despite best efforts to proofread, errors can occur which can change the meaning. Any transcriptional errors that result from this process are unintentional and may not be fully corrected at the time of dictation.

## 2023-01-27 NOTE — Telephone Encounter (Signed)
I have released the labs and notified the patient by phone.  Nothing further needed.

## 2023-01-27 NOTE — Patient Instructions (Signed)
VISIT SUMMARY:  During today's visit, we discussed your ongoing management of COPD, gastroesophageal reflux, and potential food allergies. You reported improvement in your COPD symptoms after recent treatment, but you continue to smoke, which we addressed. We also talked about your persistent reflux symptoms and possible food allergies. Additionally, we reviewed the importance of getting a flu shot.  YOUR PLAN:  -CHRONIC OBSTRUCTIVE PULMONARY DISEASE (COPD): COPD is a chronic lung disease that makes it hard to breathe. You have shown improvement with recent medications, but smoking continues to worsen your symptoms. We discussed the benefits of quitting smoking and you prefer a gradual approach. We will refill your albuterol and Breztri inhalers, provide Breztri samples, and schedule repeat pulmonary function tests. Please follow up in three months.  -GASTROESOPHAGEAL REFLUX DISEASE (GERD): GERD is a condition where stomach acid frequently flows back into the esophagus, causing heartburn and other symptoms. You have persistent reflux and shortness of breath after meals. We recommend smaller, frequent meals and avoiding salty foods, which have helped. We also discussed the benefits of a four-hour impedance test to further evaluate your condition. A referral for this test has been made by Dr. Everlene Farrier.  -FOOD ALLERGY: You may have allergies to bananas, which could lead to cross reactivities particularly, latex. We discussed the process of allergist skin testing to identify specific allergies. Will start with a blood test.  -GENERAL HEALTH MAINTENANCE: We discussed the importance of getting a flu shot to reduce the risk of influenza-related complications. Please consider getting vaccinated.  INSTRUCTIONS:  Please follow up in three months. Additionally, ensure you get your albuterol and Breztri inhalers refilled, and attend the scheduled pulmonary function tests and the four-hour impedance test. Also,  follow up with the allergist for skin testing.

## 2023-01-30 LAB — FOOD ALLERGY PROFILE
Allergen Corn, IgE: <0.1 kU/L
Clam IgE: <0.1 kU/L
Codfish IgE: <0.1 kU/L
Egg White IgE: <0.1 kU/L
Milk IgE: <0.1 kU/L
Peanut IgE: <0.1 kU/L
Scallop IgE: <0.1 kU/L
Sesame Seed IgE: <0.1 kU/L
Shrimp IgE: <0.1 kU/L
Soybean IgE: <0.1 kU/L
Walnut IgE: <0.1 kU/L
Wheat IgE: <0.1 kU/L

## 2023-02-03 ENCOUNTER — Telehealth: Payer: Self-pay

## 2023-02-03 DIAGNOSIS — Z91018 Allergy to other foods: Secondary | ICD-10-CM

## 2023-02-03 NOTE — Telephone Encounter (Signed)
I notified the patient. She wants to know if we can refer to an Allergist for more testing?

## 2023-02-03 NOTE — Telephone Encounter (Signed)
-----   Message from Sarina Ser sent at 02/03/2023  4:41 PM EST ----- Her panel for food allergy was negative however it is a limited panel she may need to see allergy if she continues to have issues with certain foods like bananas.  She may need to have more formal testing.

## 2023-02-03 NOTE — Telephone Encounter (Signed)
Yes we can refer her with the diagnosis of food allergy suspected.

## 2023-02-04 NOTE — Telephone Encounter (Signed)
I have placed the referral and notified the patient.  Nothing further needed.

## 2023-02-08 ENCOUNTER — Telehealth: Payer: 59 | Admitting: Nurse Practitioner

## 2023-02-08 DIAGNOSIS — J019 Acute sinusitis, unspecified: Secondary | ICD-10-CM

## 2023-02-08 DIAGNOSIS — B9789 Other viral agents as the cause of diseases classified elsewhere: Secondary | ICD-10-CM | POA: Diagnosis not present

## 2023-02-08 NOTE — Progress Notes (Signed)
E-Visit for Sinus Problems  We are sorry that you are not feeling well.  Here is how we plan to help!  Providers prescribe antibiotics to treat infections caused by bacteria. Antibiotics are very powerful in treating bacterial infections when they are used properly. To maintain their effectiveness, they should be used only when necessary. Overuse of antibiotics has resulted in the development of superbugs that are resistant to treatment!    After careful review of your answers, I would not recommend an antibiotic for your condition.  Antibiotics are not effective against viruses and therefore should not be used to treat them. Common examples of infections caused by viruses include colds and flu   Based on what you have shared with me it looks like you have sinusitis.  Sinusitis is inflammation and infection in the sinus cavities of the head.  Based on your presentation I believe you most likely have Acute Viral Sinusitis.This is an infection most likely caused by a virus. There is not specific treatment for viral sinusitis other than to help you with the symptoms until the infection runs its course.  You may use an oral decongestant such as Mucinex D or if you have glaucoma or high blood pressure use plain Mucinex. Saline nasal spray help and can safely be used as often as needed for congestion, I recommend you continue your flonase nasal spray and saline nasal rinse. If you are not feeling any better in 5 days please feel free to reach back out to [place] or your PCP.   Some authorities believe that zinc sprays or the use of Echinacea may shorten the course of your symptoms.  Sinus infections are not as easily transmitted as other respiratory infection, however we still recommend that you avoid close contact with loved ones, especially the very young and elderly.  Remember to wash your hands thoroughly throughout the day as this is the number one way to prevent the spread of infection!  Home Care: Only  take medications as instructed by your medical team. Do not take these medications with alcohol. A steam or ultrasonic humidifier can help congestion.  You can place a towel over your head and breathe in the steam from hot water coming from a faucet. Avoid close contacts especially the very young and the elderly. Cover your mouth when you cough or sneeze. Always remember to wash your hands.  Get Help Right Away If: You develop worsening fever or sinus pain. You develop a severe head ache or visual changes. Your symptoms persist after you have completed your treatment plan.  Make sure you Understand these instructions. Will watch your condition. Will get help right away if you are not doing well or get worse.   Thank you for choosing an e-visit.  Your e-visit answers were reviewed by a board certified advanced clinical practitioner to complete your personal care plan. Depending upon the condition, your plan could have included both over the counter or prescription medications.  Please review your pharmacy choice. Make sure the pharmacy is open so you can pick up prescription now. If there is a problem, you may contact your provider through [employer] and have the prescription routed to another pharmacy.  Your safety is important to [place]. If you have drug allergies check your prescription carefully.   For the next 24 hours you can use MyChart to ask questions about today's visit, request a non-urgent call back, or ask for a work or school excuse. You will get an email in  the next two days asking about your experience. I hope that your e-visit has been valuable and will speed your recovery.

## 2023-02-08 NOTE — Progress Notes (Signed)
I have spent 5 minutes in review of e-visit questionnaire, review and updating patient chart, medical decision making and response to patient.  ° °Jerrell Mangel W Secilia Apps, NP ° °  °

## 2023-02-09 ENCOUNTER — Telehealth: Payer: 59 | Admitting: Family Medicine

## 2023-02-09 DIAGNOSIS — J4540 Moderate persistent asthma, uncomplicated: Secondary | ICD-10-CM

## 2023-02-09 MED ORDER — PREDNISONE 20 MG PO TABS: 20.0000 mg | ORAL_TABLET | Freq: Two times a day (BID) | ORAL | 0 refills | Status: AC

## 2023-02-09 MED ORDER — AZITHROMYCIN 250 MG PO TABS: ORAL_TABLET | ORAL | 0 refills | Status: AC

## 2023-02-09 NOTE — Progress Notes (Signed)
E-Visit for Cough   We are sorry that you are not feeling well.  Here is how we plan to help!  Based on your presentation I believe you most likely have A cough due to bacteria.  When patients have a fever and a productive cough with a change in color or increased sputum production, we are concerned about bacterial bronchitis.  If left untreated it can progress to pneumonia.  If your symptoms do not improve with your treatment plan it is important that you contact your provider.   I have prescribed Azithromyin 250 mg: two tablets now and then one tablet daily for 4 additonal days      I am also sending prednisone.   From your responses in the eVisit questionnaire you describe inflammation in the upper respiratory tract which is causing a significant cough.  This is commonly called Bronchitis and has four common causes:   Allergies Viral Infections Acid Reflux Bacterial Infection Allergies, viruses and acid reflux are treated by controlling symptoms or eliminating the cause. An example might be a cough caused by taking certain blood pressure medications. You stop the cough by changing the medication. Another example might be a cough caused by acid reflux. Controlling the reflux helps control the cough.  USE OF BRONCHODILATOR ("RESCUE") INHALERS: There is a risk from using your bronchodilator too frequently.  The risk is that over-reliance on a medication which only relaxes the muscles surrounding the breathing tubes can reduce the effectiveness of medications prescribed to reduce swelling and congestion of the tubes themselves.  Although you feel brief relief from the bronchodilator inhaler, your asthma may actually be worsening with the tubes becoming more swollen and filled with mucus.  This can delay other crucial treatments, such as oral steroid medications. If you need to use a bronchodilator inhaler daily, several times per day, you should discuss this with your provider.  There are probably  better treatments that could be used to keep your asthma under control.     HOME CARE Only take medications as instructed by your medical team. Complete the entire course of an antibiotic. Drink plenty of fluids and get plenty of rest. Avoid close contacts especially the very young and the elderly Cover your mouth if you cough or cough into your sleeve. Always remember to wash your hands A steam or ultrasonic humidifier can help congestion.   GET HELP RIGHT AWAY IF: You develop worsening fever. You become short of breath You cough up blood. Your symptoms persist after you have completed your treatment plan MAKE SURE YOU  Understand these instructions. Will watch your condition. Will get help right away if you are not doing well or get worse.    Thank you for choosing an e-visit.  Your e-visit answers were reviewed by a board certified advanced clinical practitioner to complete your personal care plan. Depending upon the condition, your plan could have included both over the counter or prescription medications.  Please review your pharmacy choice. Make sure the pharmacy is open so you can pick up prescription now. If there is a problem, you may contact your provider through [employer] and have the prescription routed to another pharmacy.  Your safety is important to [place]. If you have drug allergies check your prescription carefully.   For the next 24 hours you can use MyChart to ask questions about today's visit, request a non-urgent call back, or ask for a work or school excuse. You will get an email in the next  two days asking about your experience. I hope that your e-visit has been valuable and will speed your recovery.  have provided 5 minutes of non face to face time during this encounter for chart review and documentation.

## 2023-02-17 DIAGNOSIS — J449 Chronic obstructive pulmonary disease, unspecified: Secondary | ICD-10-CM | POA: Diagnosis not present

## 2023-02-22 DIAGNOSIS — J9621 Acute and chronic respiratory failure with hypoxia: Secondary | ICD-10-CM | POA: Diagnosis not present

## 2023-02-24 ENCOUNTER — Telehealth: Payer: Self-pay | Admitting: Pulmonary Disease

## 2023-02-24 ENCOUNTER — Encounter: Payer: Self-pay | Admitting: Pulmonary Disease

## 2023-02-24 ENCOUNTER — Telehealth: Payer: 59 | Admitting: Physician Assistant

## 2023-02-24 DIAGNOSIS — J441 Chronic obstructive pulmonary disease with (acute) exacerbation: Secondary | ICD-10-CM | POA: Diagnosis not present

## 2023-02-24 MED ORDER — PREDNISONE 20 MG PO TABS
40.0000 mg | ORAL_TABLET | Freq: Every day | ORAL | 0 refills | Status: DC
Start: 2023-02-24 — End: 2023-03-03

## 2023-02-24 NOTE — Progress Notes (Signed)
E-Visit for Cough   We are sorry that you are not feeling well.  Here is how we plan to help!  Based on your presentation I believe you most likely have a mild COPD exacerbation. I see you spoke with your Pulmonologist office this morning and I recommend you follow the instructions given. I  am adding on a short course of prednisone to start as well.   From your responses in the eVisit questionnaire you describe inflammation in the upper respiratory tract which is causing a significant cough.  This is commonly called Bronchitis and has four common causes:   Allergies Viral Infections Acid Reflux Bacterial Infection Allergies, viruses and acid reflux are treated by controlling symptoms or eliminating the cause. An example might be a cough caused by taking certain blood pressure medications. You stop the cough by changing the medication. Another example might be a cough caused by acid reflux. Controlling the reflux helps control the cough.  USE OF BRONCHODILATOR ("RESCUE") INHALERS: There is a risk from using your bronchodilator too frequently.  The risk is that over-reliance on a medication which only relaxes the muscles surrounding the breathing tubes can reduce the effectiveness of medications prescribed to reduce swelling and congestion of the tubes themselves.  Although you feel brief relief from the bronchodilator inhaler, your asthma may actually be worsening with the tubes becoming more swollen and filled with mucus.  This can delay other crucial treatments, such as oral steroid medications. If you need to use a bronchodilator inhaler daily, several times per day, you should discuss this with your provider.  There are probably better treatments that could be used to keep your asthma under control.     HOME CARE Only take medications as instructed by your medical team. Complete the entire course of an antibiotic. Drink plenty of fluids and get plenty of rest. Avoid close contacts especially  the very young and the elderly Cover your mouth if you cough or cough into your sleeve. Always remember to wash your hands A steam or ultrasonic humidifier can help congestion.   GET HELP RIGHT AWAY IF: You develop worsening fever. You become short of breath You cough up blood. Your symptoms persist after you have completed your treatment plan MAKE SURE YOU  Understand these instructions. Will watch your condition. Will get help right away if you are not doing well or get worse.    Thank you for choosing an e-visit.  Your e-visit answers were reviewed by a board certified advanced clinical practitioner to complete your personal care plan. Depending upon the condition, your plan could have included both over the counter or prescription medications.  Please review your pharmacy choice. Make sure the pharmacy is open so you can pick up prescription now. If there is a problem, you may contact your provider through [employer] and have the prescription routed to another pharmacy.  Your safety is important to [place]. If you have drug allergies check your prescription carefully.   For the next 24 hours you can use MyChart to ask questions about today's visit, request a non-urgent call back, or ask for a work or school excuse. You will get an email in the next two days asking about your experience. I hope that your e-visit has been valuable and will speed your recovery.

## 2023-02-24 NOTE — Telephone Encounter (Signed)
Called and spoke to patient.  She reports of wheezing, non prod cough and increased SOB x2d. Denied f/c/s por additional sx Wheezing improved after using Breztri but worsened after Markus Daft 'wore off'. She is using albuterol HFA TID, albuterol solution TID and Breztri BID.  Neg home covid test this morning.  She wears 1L at bedtime. She does not  monitor spo2.  Dr. Jayme Cloud, please advise. Thanks

## 2023-02-24 NOTE — Telephone Encounter (Signed)
Monitor for fever and call with any changes.  Monitor for productive cough.  Recommend continue Breztri 2 puffs twice a day and use albuterol 2-3 times per day as needed for cough over the next few days.  Stay well-hydrated.  Use Mucinex DM extra strength over-the-counter twice a day.  If symptoms worsen recommend urgent care.  She also needs to continue taking antireflux medications.

## 2023-02-24 NOTE — Telephone Encounter (Signed)
Pt noticed some wheezing yesterday that seemed to stop after taking her Breztri. This morning she woke up short of breath and with the wheezing. Wants to know if she needs to be seen or if she needs to be put on something

## 2023-02-24 NOTE — Progress Notes (Signed)
I have spent 5 minutes in review of e-visit questionnaire, review and updating patient chart, medical decision making and response to patient.   Mia Milan Cody Jacklynn Dehaas, PA-C    

## 2023-02-24 NOTE — Telephone Encounter (Signed)
 Pt is aware of below message/recommendations and voiced her understanding.  Nothing further needed.

## 2023-02-24 NOTE — Telephone Encounter (Signed)
See telephone encounter from today.  Delray Alt has spoke with the patient.  Nothing further needed.

## 2023-02-25 ENCOUNTER — Telehealth: Payer: 59 | Admitting: Physician Assistant

## 2023-02-25 DIAGNOSIS — J441 Chronic obstructive pulmonary disease with (acute) exacerbation: Secondary | ICD-10-CM

## 2023-02-25 MED ORDER — AMOXICILLIN-POT CLAVULANATE 875-125 MG PO TABS: 1.0000 | ORAL_TABLET | Freq: Two times a day (BID) | ORAL | 0 refills | Status: DC

## 2023-02-25 NOTE — Progress Notes (Signed)
Patient evaluated yesterday via e-visit for suspected COPD exacerbation. Due to change in sputum antibiotic therapy was also added to prednisone given at yesterday's visit.

## 2023-02-27 ENCOUNTER — Ambulatory Visit: Payer: Self-pay | Admitting: *Deleted

## 2023-02-27 ENCOUNTER — Emergency Department: Payer: 59

## 2023-02-27 ENCOUNTER — Encounter: Payer: Self-pay | Admitting: Emergency Medicine

## 2023-02-27 ENCOUNTER — Emergency Department
Admission: EM | Admit: 2023-02-27 | Discharge: 2023-02-27 | Disposition: A | Discharge status: Home / Self Care | Payer: 59 | Attending: Emergency Medicine | Admitting: Emergency Medicine

## 2023-02-27 ENCOUNTER — Other Ambulatory Visit: Payer: Self-pay

## 2023-02-27 DIAGNOSIS — R059 Cough, unspecified: Secondary | ICD-10-CM | POA: Diagnosis not present

## 2023-02-27 DIAGNOSIS — R0602 Shortness of breath: Secondary | ICD-10-CM | POA: Diagnosis not present

## 2023-02-27 DIAGNOSIS — J449 Chronic obstructive pulmonary disease, unspecified: Secondary | ICD-10-CM | POA: Insufficient documentation

## 2023-02-27 DIAGNOSIS — I10 Essential (primary) hypertension: Secondary | ICD-10-CM | POA: Insufficient documentation

## 2023-02-27 DIAGNOSIS — Z1152 Encounter for screening for COVID-19: Secondary | ICD-10-CM | POA: Diagnosis not present

## 2023-02-27 DIAGNOSIS — I771 Stricture of artery: Secondary | ICD-10-CM | POA: Diagnosis not present

## 2023-02-27 DIAGNOSIS — J069 Acute upper respiratory infection, unspecified: Secondary | ICD-10-CM | POA: Diagnosis not present

## 2023-02-27 DIAGNOSIS — B9789 Other viral agents as the cause of diseases classified elsewhere: Secondary | ICD-10-CM | POA: Diagnosis not present

## 2023-02-27 LAB — CBC
HCT: 43.3 % (ref 36.0–46.0)
Hemoglobin: 14.6 g/dL (ref 12.0–15.0)
MCH: 29 pg (ref 26.0–34.0)
MCHC: 33.7 g/dL (ref 30.0–36.0)
MCV: 86.1 fL (ref 80.0–100.0)
Platelets: 238 10*3/uL (ref 150–400)
RBC: 5.03 MIL/uL (ref 3.87–5.11)
RDW: 14.6 % (ref 11.5–15.5)
WBC: 7 10*3/uL (ref 4.0–10.5)
nRBC: 0 % (ref 0.0–0.2)

## 2023-02-27 LAB — BASIC METABOLIC PANEL
Anion gap: 7 (ref 5–15)
BUN: 15 mg/dL (ref 6–20)
CO2: 29 mmol/L (ref 22–32)
Calcium: 9.4 mg/dL (ref 8.9–10.3)
Chloride: 99 mmol/L (ref 98–111)
Creatinine, Ser: 0.88 mg/dL (ref 0.44–1.00)
GFR, Estimated: >60 mL/min (ref >60)
Glucose, Bld: 99 mg/dL (ref 70–99)
Potassium: 4.4 mmol/L (ref 3.5–5.1)
Sodium: 135 mmol/L (ref 135–145)

## 2023-02-27 LAB — RESP PANEL BY RT-PCR (RSV, FLU A&B, COVID)  RVPGX2
Influenza A by PCR: NEGATIVE
Influenza B by PCR: NEGATIVE
Resp Syncytial Virus by PCR: NEGATIVE
SARS Coronavirus 2 by RT PCR: NEGATIVE

## 2023-02-27 LAB — TROPONIN I (HIGH SENSITIVITY): Troponin I (High Sensitivity): <2 ng/L (ref <18)

## 2023-02-27 MED ORDER — IPRATROPIUM-ALBUTEROL 0.5-2.5 (3) MG/3ML IN SOLN
3.0000 mL | Freq: Once | RESPIRATORY_TRACT | Status: AC
  Administered 2023-02-27: 3 mL via RESPIRATORY_TRACT
  Filled 2023-02-27: qty 3

## 2023-02-27 MED ORDER — PREDNISONE 10 MG PO TABS: 40.0000 mg | ORAL_TABLET | Freq: Every day | ORAL | 0 refills | Status: AC

## 2023-02-27 NOTE — Telephone Encounter (Signed)
  Chief Complaint: SOB at rest, cough, sore throat hx COPD Symptoms: cough, congestion unable to cough up mucus. Wheezing, SOB at rest. Sore throat. Fatigue . Wears O2 at 1-2 L at hs. Frequency: 02/24/23 Pertinent Negatives: Patient denies na  Disposition: [x] ED /[] Urgent Care (no appt availability in office) / [] Appointment(In office/virtual)/ []  [facility]/ [] Home Care/ [] Refused Recommended Disposition /[] [facility]/ []  Follow-up with PCP Additional Notes:  Recommended ED now . Please advise .     Reason for Disposition  [1] MODERATE difficulty breathing (e.g., speaks in phrases, SOB even at rest, pulse 100-120) AND [2] NEW-onset or WORSE than normal  Answer Assessment - Initial Assessment Questions 1. RESPIRATORY STATUS: "Describe your breathing?" (e.g., wheezing, shortness of breath, unable to speak, severe coughing)      Shortness of breath at rest,  coughing , wheezing   2. ONSET: "When did this breathing problem begin?"      02/24/23 3. PATTERN "Does the difficult breathing come and go, or has it been constant since it started?"      At rest  4. SEVERITY: "How bad is your breathing?" (e.g., mild, moderate, severe)    - MILD: No SOB at rest, mild SOB with walking, speaks normally in sentences, can lie down, no retractions, pulse < 100.    - MODERATE: SOB at rest, SOB with minimal exertion and prefers to sit, cannot lie down flat, speaks in phrases, mild retractions, audible wheezing, pulse 100-120.    - SEVERE: Very SOB at rest, speaks in single words, struggling to breathe, sitting hunched forward, retractions, pulse > 120     " Lungs ", SOB at rest.  Hx COPD 5. RECURRENT SYMPTOM: "Have you had difficulty breathing before?" If Yes, ask: "When was the last time?" and "What happened that time?"      Yes  6. CARDIAC HISTORY: "Do you have any history of heart disease?" (e.g., heart attack, angina, bypass surgery, angioplasty)      na 7. LUNG HISTORY: "Do you  have any history of lung disease?"  (e.g., pulmonary embolus, asthma, emphysema)     COPD 8. CAUSE: "What do you think is causing the breathing problem?"      na 9. OTHER SYMPTOMS: "Do you have any other symptoms? (e.g., dizziness, runny nose, cough, chest pain, fever)     Cough unable to get up mucus. Congested cough. Sore throat. SOB at rest. Hx COPD. Wears oxygen 1-2 L at night  10. O2 SATURATION MONITOR:  "Do you use an oxygen saturation monitor (pulse oximeter) at home?" If Yes, ask: "What is your reading (oxygen level) today?" "What is your usual oxygen saturation reading?" (e.g., 95%)       na 11. PREGNANCY: "Is there any chance you are pregnant?" "When was your last menstrual period?"       na 12. TRAVEL: "Have you traveled out of the country in the last month?" (e.g., travel history, exposures)       na  Protocols used: Breathing Difficulty-A-AH

## 2023-02-27 NOTE — Discharge Instructions (Signed)
Please pick up the prednisone from here pharmacy and take as prescribed.  You can continue taking the Augmentin as well to treat any possible occult or hidden pneumonia not seen on chest x-ray today.  Please follow-up with your primary care provider or pulmonologist within the next 5 to 7 days for reassessment if symptoms are persisting.  Please return for any severe worsening shortness of breath or any constant chest pain

## 2023-02-27 NOTE — ED Triage Notes (Signed)
Pt here with SOB and a cough that started Monday. Pt states she had a sharp pain run through her chest this morning. Pt also says she feels like her lungs hurt and burn. Pt denies NVD or fever but states she is having night sweats.

## 2023-02-27 NOTE — ED Provider Notes (Signed)
[facility] Provider Note    Event Date/Time   First MD Initiated Contact with Patient 02/27/23 [phone]     (approximate)   History   Cough and Shortness of Breath   HPI Andrea [name] is a 57 y.o. female with history of COPD, HTN, chronic tobacco use presenting today for cough and shortness of breath.  Patient states for the past 3 days she has had dry cough, congestion, and intermittent shortness of breath.  She has had occasional sweats but denies any fevers.  Otherwise denies at this time having any chest pain, nausea, vomiting, abdominal pain, leg pain, leg swelling.  Denies any pleuritic chest pain.  She states feeling tightness when trying to take a deep breath.  Had an e-visit with her PCP 3 days ago and was started on Augmentin but has not noticed any change in symptoms.  Not been on any steroids recently.  Reviewed recent chart notes.     Physical Exam   Triage Vital Signs: ED Triage Vitals  Encounter Vitals Group     BP 02/27/23 0906 101/68     Systolic BP Percentile --      Diastolic BP Percentile --      Pulse Rate 02/27/23 0906 87     Resp 02/27/23 0906 18     Temp 02/27/23 0906 98.2 F (36.8 C)     Temp Source 02/27/23 0906 Oral     SpO2 02/27/23 0906 97 %     Weight 02/27/23 0902 134 lb 14.7 oz (61.2 kg)     Height 02/27/23 0902 5\' 2"  (1.575 m)     Head Circumference --      Peak Flow --      Pain Score 02/27/23 0902 9     Pain Loc --      Pain Education --      Exclude from Growth Chart --     Most recent vital signs: Vitals:   02/27/23 0906  BP: 101/68  Pulse: 87  Resp: 18  Temp: 98.2 F (36.8 C)  SpO2: 97%   Physical Exam: I have reviewed the vital signs and nursing notes. General: Awake, alert, no acute distress.  Nontoxic appearing. Head:  Atraumatic, normocephalic.   ENT:  EOM intact, PERRL. Oral mucosa is pink and moist with no lesions. Neck: Neck is supple with full range of motion, No meningeal  signs. Cardiovascular:  RRR, No murmurs. Peripheral pulses palpable and equal bilaterally. Respiratory:  Symmetrical chest wall expansion.  No rhonchi, rales, or wheezes.  Good air movement throughout.  No use of accessory muscles.   Musculoskeletal:  No cyanosis or edema. Moving extremities with full ROM Abdomen:  Soft, nontender, nondistended. Neuro:  GCS 15, moving all four extremities, interacting appropriately. Speech clear. Psych:  Calm, appropriate.   Skin:  Warm, dry, no rash.    ED Results / Procedures / Treatments   Labs (all labs ordered are listed, but only abnormal results are displayed) Labs Reviewed  RESP PANEL BY RT-PCR (RSV, FLU A&B, COVID)  [id number]  BASIC METABOLIC PANEL  CBC  TROPONIN I (HIGH SENSITIVITY)     EKG My EKG interpretation: Rate of 86, normal axis, normal intervals.  No acute ST elevations or depressions   RADIOLOGY Independently interpreted chest x-ray with no acute pathology   PROCEDURES:  Critical Care performed: No  Procedures   MEDICATIONS ORDERED IN ED: Medications  ipratropium-albuterol (DUONEB) 0.5-2.5 (3) MG/3ML nebulizer solution 3 mL (3 mLs Nebulization  Given 02/27/23 0952)     IMPRESSION / MDM / ASSESSMENT AND PLAN / ED COURSE  I reviewed the triage vital signs and the nursing notes.                              Differential diagnosis includes, but is not limited to, viral URI, pneumonia, pneumothorax, bronchitis, low suspicion for ACS or PE  Patient's presentation is most consistent with acute complicated illness / injury requiring diagnostic workup.  Patient is a 57 year old female presenting today for cough, congestion, and shortness of breath.  Vital signs are stable on arrival and physical exam unremarkable.  No swelling or pain to the legs and no history of blood clots.  EKG unremarkable and troponin negative.  No active chest pain symptoms concerning for ACS.  Viral swabs negative.  Chest x-ray unremarkable.   Laboratory workup otherwise negative.  Symptoms appear to be most consistent with viral URI versus bronchitis in the setting of COPD.  She was already started on Augmentin and feel this is okay to continue but we will start her on a short prednisone burst.  Told to follow-up with PCP or pulmonologist within the next week.  Given strict return precautions for worsening breathing symptoms.  Clinical Course as of 02/27/23 1025  Thu Feb 27, 2023  1023 Reassessed.  Patient feeling well with minimal symptoms at this time [DW]    Clinical Course User Index [DW] Janith Lima, MD     FINAL CLINICAL IMPRESSION(S) / ED DIAGNOSES   Final diagnoses:  Viral URI with cough     Rx / DC Orders   ED Discharge Orders          Ordered    predniSONE (DELTASONE) 10 MG tablet  Daily        02/27/23 1024             Note:  This document was prepared using Dragon voice recognition software and may include unintentional dictation errors.   Janith Lima, MD 02/27/23 1026

## 2023-03-06 ENCOUNTER — Ambulatory Visit: Payer: 59 | Admitting: Family Medicine

## 2023-03-06 NOTE — Progress Notes (Deleted)
 Established patient visit   Patient: Andrea [name]   DOB: 1965-03-24   58 y.o. Female  MRN: [id number] Visit Date: 03/06/2023  Today's healthcare provider: LAURAINE LOISE BUOY, DO   No chief complaint on file.  Subjective    HPI ED visit 02/27/2023 Seen for cough and intermittent shortness of breath x 3 days Started on prednisone  02/24/2023 and amoxicillin  02/25/2023 but did not improve. Given additional prednisone  from the ER Chest x-ray negative for acute cardiopulmonary findings; did show hyperinflation and flattening of diaphragms reflective of COPD.   ***  {History (Optional):23778}  Medications: Outpatient Medications Prior to Visit  Medication Sig   albuterol  (PROVENTIL ) (2.5 MG/3ML) 0.083% nebulizer solution USE 1 VIAL IN NEBULIZER EVERY 4 (FOUR) HOURS AS NEEDED FOR WHEEZING OR SHORTNESS OF BREATH.   albuterol  (VENTOLIN  HFA) 108 (90 Base) MCG/ACT inhaler Inhale 2 puffs into the lungs every 6 (six) hours as needed for wheezing or shortness of breath.   amoxicillin -clavulanate (AUGMENTIN ) 875-125 MG tablet Take 1 tablet by mouth 2 (two) times daily.   atorvastatin  (LIPITOR) 40 MG tablet Take 1 tablet (40 mg total) by mouth daily.   Budeson-Glycopyrrol-Formoterol  (BREZTRI  AEROSPHERE) 160-9-4.8 MCG/ACT AERO Inhale 2 puffs into the lungs in the morning and at bedtime.   Budeson-Glycopyrrol-Formoterol  (BREZTRI  AEROSPHERE) 160-9-4.8 MCG/ACT AERO Inhale 2 puffs into the lungs in the morning and at bedtime.   Budeson-Glycopyrrol-Formoterol  (BREZTRI  AEROSPHERE) 160-9-4.8 MCG/ACT AERO Inhale 2 puffs into the lungs in the morning and at bedtime.   clonazePAM  (KLONOPIN ) 1 MG tablet TAKE 1 TABLET BY MOUTH 2 TIMES DAILY AS NEEDED. DNF UNTIL 11/3   dexlansoprazole  (DEXILANT ) 60 MG capsule Take 1 capsule (60 mg total) by mouth daily.   fluticasone  (FLONASE ) 50 MCG/ACT nasal spray Place 2 sprays into both nostrils as needed.   hydroxychloroquine  (PLAQUENIL ) 200 MG tablet TAKE 2 TABLETS  (400 MG TOTAL) BY MOUTH DAILY FOR DISCOID LUPUS (L93.0)   Misc. Devices (PULSE OXIMETER DELUXE) MISC Use daily to monitor oxygen  levels   Nebulizers (COMPRESSOR/NEBULIZER) MISC Supplies only   Nebulizers (COMPRESSOR/NEBULIZER) MISC Use as directed   nitroGLYCERIN  (NITROSTAT ) 0.4 MG SL tablet Place 1 tablet (0.4 mg total) under the tongue every 5 (five) minutes as needed for chest pain.   OXYGEN  Inhale 2 L into the lungs as needed (at while sleeping).   oxymetazoline  (AFRIN NASAL SPRAY) 0.05 % nasal spray 2 sprays per nare for acute nasal bleeding/epistaxis   prednisoLONE  acetate (PRED FORTE ) 1 % ophthalmic suspension PLACE 1 DROP INTO THE LEFT EYE 4 (FOUR) TIMES DAILY FOR AT LEAST TWO DAYS AND CONTINUE UNTIL SYMPTOMS IMPROVE, ONCE SYMPTOMS IMPROVE PLEASE TAPER DOSING, 3 TIMES DAY, TWICE DAILY, ONCE DAILY. DO NOT TAKE WITH YOUR OTHER EYE DROP -DUREZOL    predniSONE  (DELTASONE ) 20 MG tablet Take 2 tablets (40 mg total) by mouth daily with breakfast.   Respiratory Therapy Supplies (FLUTTER) DEVI 1 Device by Does not apply route daily.   Spacer/Aero-Holding Chambers (AEROCHAMBER MV) inhaler Use as instructed   Spacer/Aero-Holding Chambers (AEROCHAMBER MV) inhaler Use as instructed   No facility-administered medications prior to visit.    Review of Systems ***  {Insert previous labs (optional):23779} {See past labs  Heme  Chem  Endocrine  Serology  Results Review (optional):1}   Objective    There were no vitals taken for this visit. {Insert last BP/Wt (optional):23777}{See vitals history (optional):1}   Physical Exam   No results found for any visits on 03/06/23.  Assessment & Plan  There are no diagnoses linked to this encounter.  ***  No follow-ups on file.      I discussed the assessment and treatment plan with the patient  The patient was provided an opportunity to ask questions and all were answered. The patient agreed with the plan and demonstrated an understanding of  the instructions.   The patient was advised to call back or seek an in-person evaluation if the symptoms worsen or if the condition fails to improve as anticipated.    LAURAINE LOISE BUOY, DO  [facility] [phone] (phone) [phone] (fax)  [facility]

## 2023-03-22 ENCOUNTER — Telehealth: Payer: 59 | Admitting: Nurse Practitioner

## 2023-03-22 DIAGNOSIS — R0981 Nasal congestion: Secondary | ICD-10-CM

## 2023-03-22 DIAGNOSIS — H6993 Unspecified Eustachian tube disorder, bilateral: Secondary | ICD-10-CM | POA: Diagnosis not present

## 2023-03-22 MED ORDER — IPRATROPIUM BROMIDE 0.03 % NA SOLN: 2.0000 | Freq: Two times a day (BID) | NASAL | 0 refills | Status: DC

## 2023-03-22 NOTE — Progress Notes (Signed)
I have spent 5 minutes in review of e-visit questionnaire, review and updating patient chart, medical decision making and response to patient.  ° °Jerrell Mangel W Secilia Apps, NP ° °  °

## 2023-03-22 NOTE — Progress Notes (Signed)

## 2023-03-23 ENCOUNTER — Telehealth: Payer: 59 | Admitting: Physician Assistant

## 2023-03-23 DIAGNOSIS — H6993 Unspecified Eustachian tube disorder, bilateral: Secondary | ICD-10-CM

## 2023-03-23 NOTE — Progress Notes (Signed)
  Because you were seen via telehealth yesterday for the same symptoms, I feel your condition warrants further evaluation and I recommend that you be seen in a face-to-face visit.   NOTE: There will be NO CHARGE for this E-Visit   If you are having a true medical emergency, please call 911.     For an urgent face to face visit, [place] [facility] has multiple urgent care centers for your convenience.  Click the link below for the full list of locations and hours, walk-in wait times, appointment scheduling options and driving directions:  Urgent Care - [place], [place], [place], [place], [place], [place]  [place]     Your MyChart E-visit questionnaire answers were reviewed by a board certified advanced clinical practitioner to complete your personal care plan based on your specific symptoms.    Thank you for using e-Visits.

## 2023-03-24 ENCOUNTER — Ambulatory Visit: Payer: 59 | Admitting: Family Medicine

## 2023-03-25 DIAGNOSIS — J9621 Acute and chronic respiratory failure with hypoxia: Secondary | ICD-10-CM | POA: Diagnosis not present

## 2023-04-01 ENCOUNTER — Emergency Department
Admission: EM | Admit: 2023-04-01 | Discharge: 2023-04-02 | Discharge status: Against Medical Advice | Payer: 59 | Attending: Emergency Medicine | Admitting: Emergency Medicine

## 2023-04-01 ENCOUNTER — Other Ambulatory Visit: Payer: Self-pay

## 2023-04-01 ENCOUNTER — Emergency Department: Payer: 59

## 2023-04-01 ENCOUNTER — Telehealth (INDEPENDENT_AMBULATORY_CARE_PROVIDER_SITE_OTHER): Payer: 59 | Admitting: Family Medicine

## 2023-04-01 DIAGNOSIS — I700 Atherosclerosis of aorta: Secondary | ICD-10-CM | POA: Diagnosis not present

## 2023-04-01 DIAGNOSIS — Z20822 Contact with and (suspected) exposure to covid-19: Secondary | ICD-10-CM | POA: Insufficient documentation

## 2023-04-01 DIAGNOSIS — Z5321 Procedure and treatment not carried out due to patient leaving prior to being seen by health care provider: Secondary | ICD-10-CM | POA: Insufficient documentation

## 2023-04-01 DIAGNOSIS — J439 Emphysema, unspecified: Secondary | ICD-10-CM | POA: Diagnosis not present

## 2023-04-01 DIAGNOSIS — F17200 Nicotine dependence, unspecified, uncomplicated: Secondary | ICD-10-CM | POA: Diagnosis not present

## 2023-04-01 DIAGNOSIS — J101 Influenza due to other identified influenza virus with other respiratory manifestations: Secondary | ICD-10-CM | POA: Diagnosis not present

## 2023-04-01 DIAGNOSIS — J441 Chronic obstructive pulmonary disease with (acute) exacerbation: Secondary | ICD-10-CM

## 2023-04-01 DIAGNOSIS — R0602 Shortness of breath: Secondary | ICD-10-CM | POA: Diagnosis not present

## 2023-04-01 DIAGNOSIS — F17210 Nicotine dependence, cigarettes, uncomplicated: Secondary | ICD-10-CM

## 2023-04-01 DIAGNOSIS — R059 Cough, unspecified: Secondary | ICD-10-CM | POA: Diagnosis not present

## 2023-04-01 LAB — BASIC METABOLIC PANEL
Anion gap: 12 (ref 5–15)
BUN: 16 mg/dL (ref 6–20)
CO2: 23 mmol/L (ref 22–32)
Calcium: 8.7 mg/dL — ABNORMAL LOW (ref 8.9–10.3)
Chloride: 98 mmol/L (ref 98–111)
Creatinine, Ser: 0.76 mg/dL (ref 0.44–1.00)
GFR, Estimated: >60 mL/min (ref >60)
Glucose, Bld: 91 mg/dL (ref 70–99)
Potassium: 3.9 mmol/L (ref 3.5–5.1)
Sodium: 133 mmol/L — ABNORMAL LOW (ref 135–145)

## 2023-04-01 LAB — CBC
HCT: 38.9 % (ref 36.0–46.0)
Hemoglobin: 13.2 g/dL (ref 12.0–15.0)
MCH: 28.4 pg (ref 26.0–34.0)
MCHC: 33.9 g/dL (ref 30.0–36.0)
MCV: 83.8 fL (ref 80.0–100.0)
Platelets: 177 10*3/uL (ref 150–400)
RBC: 4.64 MIL/uL (ref 3.87–5.11)
RDW: 14.4 % (ref 11.5–15.5)
WBC: 7.1 10*3/uL (ref 4.0–10.5)
nRBC: 0 % (ref 0.0–0.2)

## 2023-04-01 LAB — RESP PANEL BY RT-PCR (RSV, FLU A&B, COVID)  RVPGX2
Influenza A by PCR: POSITIVE — AB
Influenza B by PCR: NEGATIVE
Resp Syncytial Virus by PCR: NEGATIVE
SARS Coronavirus 2 by RT PCR: NEGATIVE

## 2023-04-01 LAB — TROPONIN I (HIGH SENSITIVITY): Troponin I (High Sensitivity): 4 ng/L (ref <18)

## 2023-04-01 MED ORDER — AZITHROMYCIN 250 MG PO TABS: ORAL_TABLET | ORAL | 0 refills | Status: AC

## 2023-04-01 MED ORDER — PREDNISONE 10 MG PO TABS: ORAL_TABLET | ORAL | 0 refills | Status: DC

## 2023-04-01 NOTE — ED Triage Notes (Signed)
Pt to ED POV from home for SOB and cough since 3 days. 1/2 pack day smoker since 20 years, stopped 3 days ago. Vomited 3 times today and yesterday.   Skin dry. Placed on 2L then 3L in triage. Was 87% on RA.

## 2023-04-01 NOTE — ED Notes (Signed)
No answer when called several times from lobby

## 2023-04-01 NOTE — Progress Notes (Addendum)
MyChart Video Visit    Virtual Visit via Video Note   This format is felt to be most appropriate for this patient at this time. Physical exam was limited by quality of the video and audio technology used for the visit.   Patient location: home Provider location: [facility]  I discussed the limitations of evaluation and management by telemedicine and the availability of in person appointments. The patient expressed understanding and agreed to proceed.  Patient: Andrea [name]   DOB: [birth year]   58 y.o. Female  MRN: [id number] Visit Date: 04/01/2023  Today's healthcare provider: Mila Merry, MD   No chief complaint on file.  Subjective    Discussed the use of AI scribe software for clinical note transcription with the patient, who gave verbal consent to proceed.  History of Present Illness   The patient presents with cough, runny nose, and respiratory symptoms. Symptoms began three days ago, including cough, runny nose, and a sensation of head fullness described as 'like it's in a barrel'. She experiences chills, episodes of feeling very hot, wheezing, and shortness of breath, which improved after a breathing treatment. Severe coughing fits cause eye watering, and the right ear feels clogged with discharge from the eyes. She is unable to effectively expectorate mucus, describing it as 'stuck in your chest'. No fever is reported. She has been using Mucinex and NyQuil, as recommended by her children's doctors, but feels these have not been effective. She has advanced COPD. Current medications include a Breztri inhaler and albuterol inhaler.  Her son and daughter had similar symptoms in the weeks prior, and she believes she has now contracted the same illness.       Medications: Outpatient Medications Prior to Visit  Medication Sig   albuterol (PROVENTIL) (2.5 MG/3ML) 0.083% nebulizer solution USE 1 VIAL IN NEBULIZER EVERY 4 (FOUR) HOURS AS NEEDED FOR WHEEZING OR SHORTNESS OF BREATH.    albuterol (VENTOLIN HFA) 108 (90 Base) MCG/ACT inhaler Inhale 2 puffs into the lungs every 6 (six) hours as needed for wheezing or shortness of breath.   atorvastatin (LIPITOR) 40 MG tablet Take 1 tablet (40 mg total) by mouth daily.   Budeson-Glycopyrrol-Formoterol (BREZTRI AEROSPHERE) 160-9-4.8 MCG/ACT AERO Inhale 2 puffs into the lungs in the morning and at bedtime.   Budeson-Glycopyrrol-Formoterol (BREZTRI AEROSPHERE) 160-9-4.8 MCG/ACT AERO Inhale 2 puffs into the lungs in the morning and at bedtime.   Budeson-Glycopyrrol-Formoterol (BREZTRI AEROSPHERE) 160-9-4.8 MCG/ACT AERO Inhale 2 puffs into the lungs in the morning and at bedtime.   clonazePAM (KLONOPIN) 1 MG tablet TAKE 1 TABLET BY MOUTH 2 TIMES DAILY AS NEEDED. DNF UNTIL 11/3   dexlansoprazole (DEXILANT) 60 MG capsule Take 1 capsule (60 mg total) by mouth daily.   fluticasone (FLONASE) 50 MCG/ACT nasal spray Place 2 sprays into both nostrils as needed.   hydroxychloroquine (PLAQUENIL) 200 MG tablet TAKE 2 TABLETS (400 MG TOTAL) BY MOUTH DAILY FOR DISCOID LUPUS (L93.0)   Misc. Devices (PULSE OXIMETER DELUXE) MISC Use daily to monitor oxygen levels   Nebulizers (COMPRESSOR/NEBULIZER) MISC Supplies only   Nebulizers (COMPRESSOR/NEBULIZER) MISC Use as directed   nitroGLYCERIN (NITROSTAT) 0.4 MG SL tablet Place 1 tablet (0.4 mg total) under the tongue every 5 (five) minutes as needed for chest pain.   OXYGEN Inhale 2 L into the lungs as needed (at while sleeping).   oxymetazoline (AFRIN NASAL SPRAY) 0.05 % nasal spray 2 sprays per nare for acute nasal bleeding/epistaxis   Respiratory Therapy Supplies (FLUTTER) DEVI 1  Device by Does not apply route daily.   Spacer/Aero-Holding Chambers (AEROCHAMBER MV) inhaler Use as instructed   Spacer/Aero-Holding Chambers (AEROCHAMBER MV) inhaler Use as instructed   No facility-administered medications prior to visit.   Review of Systems  Constitutional:  Negative for appetite change, chills, fatigue  and fever.  Respiratory:  Positive for cough, shortness of breath and wheezing. Negative for chest tightness.   Cardiovascular:  Negative for chest pain and palpitations.  Gastrointestinal:  Negative for abdominal pain, nausea and vomiting.  Neurological:  Negative for dizziness and weakness.       Objective    There were no vitals taken for this visit.  Physical Exam  Awake, alert, oriented x 3. In no apparent distress Coughing throughout visit.   Assessment & Plan       COPD exacerbation Symptoms of cough, rhinorrhea, and wheezing since Sunday. Family members recently had similar symptoms. COVID test negative. High risk for complications due to underlying lung disease. -Start Azithromycin (Z-Pak) and Prednisone. -Continue current inhalers and nebulizer treatments.    Call if symptoms change or if not rapidly improving.     I discussed the assessment and treatment plan with the patient. The patient was provided an opportunity to ask questions and all were answered. The patient agreed with the plan and demonstrated an understanding of the instructions.   The patient was advised to call back or seek an in-person evaluation if the symptoms worsen or if the condition fails to improve as anticipated.  I provided 12 minutes of non-face-to-face time during this encounter.     Mila Merry, MD  [facility] [phone] (phone) [phone] (fax)  [facility]

## 2023-04-02 DIAGNOSIS — J441 Chronic obstructive pulmonary disease with (acute) exacerbation: Secondary | ICD-10-CM | POA: Diagnosis not present

## 2023-04-02 DIAGNOSIS — R059 Cough, unspecified: Secondary | ICD-10-CM | POA: Diagnosis not present

## 2023-04-02 NOTE — ED Notes (Signed)
No answer when called several times from lobby

## 2023-04-03 ENCOUNTER — Other Ambulatory Visit: Payer: Self-pay | Admitting: Family Medicine

## 2023-04-03 DIAGNOSIS — L930 Discoid lupus erythematosus: Secondary | ICD-10-CM

## 2023-04-03 NOTE — Telephone Encounter (Signed)
Requested medication (s) are due for refill today - yes  Requested medication (s) are on the active medication list -yes  Future visit scheduled -no  Last refill: 11/19/22 #60 3RF  Notes to clinic: non delegated Rx  Requested Prescriptions  Pending Prescriptions Disp Refills   hydroxychloroquine (PLAQUENIL) 200 MG tablet [Pharmacy Med Name: HYDROXYCHLOROQUINE 200 MG TAB] 60 tablet 3    Sig: TAKE 2 TABLETS (400 MG TOTAL) BY MOUTH DAILY FOR DISCOID LUPUS (L93.0)     Not Delegated - Analgesics:  Antirheumatic Agents - abatacept & hydroxychloroquine Failed - 04/03/2023  8:03 AM      Failed - This refill cannot be delegated      Passed - Valid encounter within last 6 months    Recent Outpatient Visits           2 days ago COPD exacerbation (HCC)   [place] [facility] Sherrie Mustache, Demetrios Isaacs, MD   3 months ago Leg cramp   [place] [facility] Malva Limes, MD   8 months ago Acute recurrent frontal sinusitis   [place] [facility] Malva Limes, MD   9 months ago Iron deficiency   [facility] Mustache, Demetrios Isaacs, MD   10 months ago Other fatigue   [place] [facility] Malva Limes, MD       Future Appointments             In 4 weeks Salena Saner, MD [place] [facility]               Requested Prescriptions  Pending Prescriptions Disp Refills   hydroxychloroquine (PLAQUENIL) 200 MG tablet [Pharmacy Med Name: HYDROXYCHLOROQUINE 200 MG TAB] 60 tablet 3    Sig: TAKE 2 TABLETS (400 MG TOTAL) BY MOUTH DAILY FOR DISCOID LUPUS (L93.0)     Not Delegated - Analgesics:  Antirheumatic Agents - abatacept & hydroxychloroquine Failed - 04/03/2023  8:03 AM      Failed - This refill cannot be delegated      Passed - Valid encounter within last 6 months    Recent Outpatient Visits           2 days ago COPD exacerbation [facility])   [place]  [facility] Malva Limes, MD   3 months ago Leg cramp   [place] [facility] Malva Limes, MD   8 months ago Acute recurrent frontal sinusitis   [facility] Malva Limes, MD   9 months ago Iron deficiency   [facility] Malva Limes, MD   10 months ago Other fatigue   [facility] Malva Limes, MD       Future Appointments             In 4 weeks Salena Saner, MD [facility]monary Care at [facility]

## 2023-04-07 ENCOUNTER — Encounter: Payer: Self-pay | Admitting: Family Medicine

## 2023-04-07 ENCOUNTER — Ambulatory Visit: Payer: 59 | Admitting: Family Medicine

## 2023-04-07 VITALS — BP 104/61 | HR 89 | Wt 135.4 lb

## 2023-04-07 DIAGNOSIS — J101 Influenza due to other identified influenza virus with other respiratory manifestations: Secondary | ICD-10-CM | POA: Diagnosis not present

## 2023-04-07 DIAGNOSIS — J441 Chronic obstructive pulmonary disease with (acute) exacerbation: Secondary | ICD-10-CM | POA: Diagnosis not present

## 2023-04-07 MED ORDER — PREDNISONE 10 MG PO TABS: ORAL_TABLET | ORAL | 0 refills | Status: AC

## 2023-04-07 NOTE — Progress Notes (Signed)
Established patient visit   Patient: Andrea [name]   DOB: 1965/06/08   58 y.o. Female  MRN: [id number] Visit Date: 04/07/2023  Today's healthcare provider: Mila Merry, MD   Chief Complaint  Patient presents with   Shortness of Breath    Patient reports still having symptoms and sob. She has finished Azithromycin and Prednisone.  Patient using mucinex, oxygen, inhalers and Flonase. Patient did Nebulizer treatment 2 hours ago   Subjective    Shortness of Breath Pertinent negatives include no abdominal pain, chest pain, fever or vomiting.   Patient with advanced COPD had video visit 6 days ago and prescribed 6 days prednisone taper and azithromycin. Seen urgent care the next day and tested positive for flu A and took 5 day course of Tamiflu. CXR at that time did not show any acute changes. Cough is better but still get short of breath with minimal exertion. No fevers, chills, sweats. Is using inhalers per Dr. Jayme Cloud consistently.   Medications: Outpatient Medications Prior to Visit  Medication Sig   albuterol (PROVENTIL) (2.5 MG/3ML) 0.083% nebulizer solution USE 1 VIAL IN NEBULIZER EVERY 4 (FOUR) HOURS AS NEEDED FOR WHEEZING OR SHORTNESS OF BREATH.   albuterol (VENTOLIN HFA) 108 (90 Base) MCG/ACT inhaler Inhale 2 puffs into the lungs every 6 (six) hours as needed for wheezing or shortness of breath.   atorvastatin (LIPITOR) 40 MG tablet Take 1 tablet (40 mg total) by mouth daily.   Budeson-Glycopyrrol-Formoterol (BREZTRI AEROSPHERE) 160-9-4.8 MCG/ACT AERO Inhale 2 puffs into the lungs in the morning and at bedtime.   Budeson-Glycopyrrol-Formoterol (BREZTRI AEROSPHERE) 160-9-4.8 MCG/ACT AERO Inhale 2 puffs into the lungs in the morning and at bedtime.   Budeson-Glycopyrrol-Formoterol (BREZTRI AEROSPHERE) 160-9-4.8 MCG/ACT AERO Inhale 2 puffs into the lungs in the morning and at bedtime.   clonazePAM (KLONOPIN) 1 MG tablet TAKE 1 TABLET BY MOUTH 2 TIMES DAILY AS NEEDED. DNF  UNTIL 11/3   dexlansoprazole (DEXILANT) 60 MG capsule Take 1 capsule (60 mg total) by mouth daily.   fluticasone (FLONASE) 50 MCG/ACT nasal spray Place 2 sprays into both nostrils as needed.   hydroxychloroquine (PLAQUENIL) 200 MG tablet TAKE 2 TABLETS (400 MG TOTAL) BY MOUTH DAILY FOR DISCOID LUPUS (L93.0)   Misc. Devices (PULSE OXIMETER DELUXE) MISC Use daily to monitor oxygen levels   Nebulizers (COMPRESSOR/NEBULIZER) MISC Supplies only   Nebulizers (COMPRESSOR/NEBULIZER) MISC Use as directed   nitroGLYCERIN (NITROSTAT) 0.4 MG SL tablet Place 1 tablet (0.4 mg total) under the tongue every 5 (five) minutes as needed for chest pain.   OXYGEN Inhale 2 L into the lungs as needed (at while sleeping).   oxymetazoline (AFRIN NASAL SPRAY) 0.05 % nasal spray 2 sprays per nare for acute nasal bleeding/epistaxis   Respiratory Therapy Supplies (FLUTTER) DEVI 1 Device by Does not apply route daily.   Spacer/Aero-Holding Chambers (AEROCHAMBER MV) inhaler Use as instructed   Spacer/Aero-Holding Chambers (AEROCHAMBER MV) inhaler Use as instructed   [DISCONTINUED] predniSONE (DELTASONE) 10 MG tablet 6 tablets for 1 day, then 5 for 1 day, then 4 for 1 day, then 3 for 1 day, then 2 for 1 day then 1 for 1 day.   No facility-administered medications prior to visit.    Review of Systems  Constitutional:  Negative for appetite change, chills, fatigue and fever.  Respiratory:  Positive for shortness of breath. Negative for chest tightness.   Cardiovascular:  Negative for chest pain and palpitations.  Gastrointestinal:  Negative for abdominal pain, nausea  and vomiting.  Neurological:  Negative for dizziness and weakness.       Objective    BP 104/61 (BP Location: Left Arm, Patient Position: Sitting, Cuff Size: Normal)   Pulse 89   Wt 135 lb 6.4 oz (61.4 kg)   SpO2 98%   BMI 24.76 kg/m    Physical Exam   General: Appearance:    Well developed, well nourished female in no acute distress  Eyes:     PERRL, conjunctiva/corneas clear, EOM's intact       Lungs:     Diffuse expiratory rhonchi, no rales respirations unlabored  Heart:    Normal heart rate. Normal rhythm. No murmurs, rubs, or gallops.    MS:   All extremities are intact.    Neurologic:   Awake, alert, oriented x 3. No apparent focal neurological defect.        Assessment & Plan     1. COPD exacerbation (HCC) (Primary) Has worsened since finishing short prednisone taper and azithromycin.   - predniSONE (DELTASONE) 10 MG tablet; 6 tablets for 2 days, then 5 for 2 days, then 4 for 2 days, then 3 for 2 days, then 2 for 2 days, then 1 for 2 days.  Dispense: 42 tablet; Refill: 0  2. Influenza A Has completed course of Tamiflu.          Mila Merry, MD  [facility] [phone] (phone) [phone] (fax)  [facility]

## 2023-04-10 ENCOUNTER — Ambulatory Visit: Payer: Self-pay

## 2023-04-10 DIAGNOSIS — J441 Chronic obstructive pulmonary disease with (acute) exacerbation: Secondary | ICD-10-CM

## 2023-04-10 MED ORDER — LEVOFLOXACIN 500 MG PO TABS: 500.0000 mg | ORAL_TABLET | Freq: Every day | ORAL | 0 refills | Status: AC

## 2023-04-10 NOTE — Telephone Encounter (Signed)
 Levaquin  500mg  once daily for 5 days sent to pharmacy

## 2023-04-10 NOTE — Telephone Encounter (Signed)
 Patient advised.

## 2023-04-10 NOTE — Telephone Encounter (Signed)
     Chief Complaint: States she has finished steroid bit is still coughing, can't get anything up. Still has SOB, using nebulizer. Asking for Levaquin . Declines OV, Unless he wants to see me.  Symptoms: Above Frequency: Last week Pertinent Negatives: Patient denies fever Disposition: [] ED /[] Urgent Care (no appt availability in office) / [] Appointment(In office/virtual)/ []  [place] Virtual [facility]/ [] Home Care/ [x] Refused Recommended Disposition /[] [place] Mobile [facility]/ [x]  Follow-up with PCP Additional Notes: Please advise pt.  Reason for Disposition  [1] MILD difficulty breathing (e.g., minimal/no SOB at rest, SOB with walking, pulse <100) AND [2] NEW-onset or WORSE than normal  Answer Assessment - Initial Assessment Questions 1. RESPIRATORY STATUS: Describe your breathing? (e.g., wheezing, shortness of breath, unable to speak, severe coughing)      SOB 2. ONSET: When did this breathing problem begin?      Last week 3. PATTERN Does the difficult breathing come and go, or has it been constant since it started?      Comes and goes 4. SEVERITY: How bad is your breathing? (e.g., mild, moderate, severe)    - MILD: No SOB at rest, mild SOB with walking, speaks normally in sentences, can lie down, no retractions, pulse < 100.    - MODERATE: SOB at rest, SOB with minimal exertion and prefers to sit, cannot lie down flat, speaks in phrases, mild retractions, audible wheezing, pulse 100-120.    - SEVERE: Very SOB at rest, speaks in single words, struggling to breathe, sitting hunched forward, retractions, pulse > 120      Moderate 5. RECURRENT SYMPTOM: Have you had difficulty breathing before? If Yes, ask: When was the last time? and What happened that time?      Yes 6. CARDIAC HISTORY: Do you have any history of heart disease? (e.g., heart attack, angina, bypass surgery, angioplasty)      No 7. LUNG HISTORY: Do you have any history of lung disease?  (e.g., pulmonary  embolus, asthma, emphysema)     COPD 8. CAUSE: What do you think is causing the breathing problem?      Flu 9. OTHER SYMPTOMS: Do you have any other symptoms? (e.g., dizziness, runny nose, cough, chest pain, fever)     No 10. O2 SATURATION MONITOR:  Do you use an oxygen  saturation monitor (pulse oximeter) at home? If Yes, ask: What is your reading (oxygen  level) today? What is your usual oxygen  saturation reading? (e.g., 95%)       95 11. PREGNANCY: Is there any chance you are pregnant? When was your last menstrual period?       No 12. TRAVEL: Have you traveled out of the country in the last month? (e.g., travel history, exposures)       No  Protocols used: Breathing Difficulty-A-AH

## 2023-04-13 DIAGNOSIS — R059 Cough, unspecified: Secondary | ICD-10-CM | POA: Diagnosis not present

## 2023-04-13 DIAGNOSIS — J440 Chronic obstructive pulmonary disease with acute lower respiratory infection: Secondary | ICD-10-CM | POA: Diagnosis not present

## 2023-04-13 DIAGNOSIS — R0602 Shortness of breath: Secondary | ICD-10-CM | POA: Diagnosis not present

## 2023-04-13 DIAGNOSIS — J209 Acute bronchitis, unspecified: Secondary | ICD-10-CM | POA: Diagnosis not present

## 2023-04-16 ENCOUNTER — Encounter: Payer: Self-pay | Admitting: Pulmonary Disease

## 2023-04-16 ENCOUNTER — Ambulatory Visit: Payer: Self-pay

## 2023-04-16 MED ORDER — BREZTRI AEROSPHERE 160-9-4.8 MCG/ACT IN AERO: 2.0000 | INHALATION_SPRAY | Freq: Two times a day (BID) | RESPIRATORY_TRACT | 0 refills | Status: DC

## 2023-04-16 NOTE — Telephone Encounter (Signed)
Message from [place] M sent at 04/16/2023  1:58 PM EST  Summary: Pt request preventative medication for Covid   Pt stated that her daughter was diagnosed with Covid and she would like to discuss possible options for preventative medications. Cb# [id number]         Chief Complaint: cough x 3 weeks and close covid exposure Symptoms: cough with congestion and SOB with exertion Frequency: daughter tested positive today Pertinent Negatives: Patient denies fever, N/V/D Disposition: [] ED /[] Urgent Care (no appt availability in office) / [x] Appointment(In office/virtual)/ []  [place] Virtual [facility]/ [] Home Care/ [] Refused Recommended Disposition /[] [place] Mobile Bus/ []  Follow-up with PCP Additional Notes: Advised to seek medical care immediately if SOB worsens or if feels worse and unable to tolerate fluids.  Reason for Disposition  [1] COVID-19 EXPOSURE within last 14 days AND [2] weak immune system (e.g., HIV positive, cancer chemo, splenectomy, organ transplant, chronic steroids) AND [3] NO symptoms  Answer Assessment - Initial Assessment Questions 1. COVID-19 EXPOSURE: "Please describe how you were exposed to someone with a COVID-19 infection."     home 2. PLACE of CONTACT: "Where were you when you were exposed to COVID-19?" (e.g., home, school, medical waiting room; which city?)     home 3. TYPE of CONTACT: "How much contact was there?" (e.g., sitting next to, live in same house, work in same office, same building)     Lives in same  4. DURATION of CONTACT: "How long were you in contact with the COVID-19 patient?" (e.g., a few seconds, passed by person, a few minutes, 15 minutes or longer, live with the patient)     Live with pt 5. MASK: "Were you wearing a mask?" "Was the other person wearing a mask?" Note: wearing a mask reduces the risk of an otherwise close contact.     no 6. DATE of CONTACT: "When did you have contact with a COVID-19 patient?" (e.g., how many days ago)      Everyday  7. COMMUNITY SPREAD: "Do you live in or have you traveled to an area where there are lots of COVID-19 cases (community spread)?" (See public health department website, if unsure)       yes 8. SYMPTOMS: "Do you have any symptoms?" (e.g., fever, cough, breathing difficulty, loss of taste or smell)     Cough, SOB with exertion 9. VACCINE: "Have you gotten the COVID-19 vaccine?" If Yes, ask: "Which one, how many shots, when did you get it?"    2 10. HIGH RISK: "Do you have any heart or lung problems?" (e.g., asthma, COPD, heart failure) "Do you have a weak immune system or other risk factors?" (e.g., HIV positive, chemotherapy, renal failure, diabetes mellitus, sickle cell anemia, obesity)       COPD on Augmentin went to [facility]  and steroids  Protocols used: Coronavirus (COVID-19) Exposure-A-AH

## 2023-04-16 NOTE — Telephone Encounter (Signed)
Okay to provide samples.  She may want to consider switching the [facility] to a mail order pharmacy through her insurance.  This tends to minimize the nonavailability issue.

## 2023-04-17 ENCOUNTER — Other Ambulatory Visit: Payer: Self-pay | Admitting: Family Medicine

## 2023-04-17 DIAGNOSIS — E785 Hyperlipidemia, unspecified: Secondary | ICD-10-CM

## 2023-04-17 DIAGNOSIS — L930 Discoid lupus erythematosus: Secondary | ICD-10-CM

## 2023-04-17 DIAGNOSIS — J441 Chronic obstructive pulmonary disease with (acute) exacerbation: Secondary | ICD-10-CM

## 2023-04-17 MED ORDER — BREZTRI AEROSPHERE 160-9-4.8 MCG/ACT IN AERO: 2.0000 | INHALATION_SPRAY | Freq: Two times a day (BID) | RESPIRATORY_TRACT | 3 refills | Status: DC

## 2023-04-17 MED ORDER — ALBUTEROL SULFATE HFA 108 (90 BASE) MCG/ACT IN AERS: 2.0000 | INHALATION_SPRAY | Freq: Four times a day (QID) | RESPIRATORY_TRACT | 3 refills | Status: DC | PRN

## 2023-04-17 NOTE — Telephone Encounter (Signed)
Pt requests that 3 of her prescriptions be sent to her new pharmacy  Medication Refill - Most Recent Primary Care Visit:  Provider: Malva Limes  Department: [id number]  Visit Type: MYCHART VIDEO VISIT  Date: 04/01/2023  Medication: atorvastatin (LIPITOR) 40 MG tablet, hydroxychloroquine (PLAQUENIL) 200 MG tablet, and clonazePAM (KLONOPIN) 1 MG tablet   Has the patient contacted their pharmacy? No  Is this the correct pharmacy for this prescription? Yes If no, delete pharmacy and type the correct one.  This is the patient's preferred pharmacy: [facility] - [place], [place] - [address] [facility] [address] [place] [address] [place] [address] Phone: [phone] Fax: [phone]  Has the prescription been filled recently? Yes  Is the patient out of the medication? No  Has the patient been seen for an appointment in the last year OR does the patient have an upcoming appointment? Yes  Can we respond through MyChart? Yes  Agent: Please be advised that Rx refills may take up to 3 business days. We ask that you follow-up with your pharmacy.

## 2023-04-17 NOTE — Telephone Encounter (Signed)
Requested medications are due for refill today.  Lipitor and Klonopin are  Requested medications are on the active medications list.  yes  Last refill. Varied  Future visit scheduled.   no  Notes to clinic.  Pt wants rx's to go to a new pharmacy.    Requested Prescriptions  Pending Prescriptions Disp Refills   atorvastatin (LIPITOR) 40 MG tablet 90 tablet 1    Sig: Take 1 tablet (40 mg total) by mouth daily.     Cardiovascular:  Antilipid - Statins Failed - 04/17/2023  5:38 PM      Failed - Lipid Panel in normal range within the last 12 months    Cholesterol, Total  Date Value Ref Range Status  12/27/2022 192 100 - 199 mg/dL Final   LDL Chol Calc (NIH)  Date Value Ref Range Status  12/27/2022 127 (H) 0 - 99 mg/dL Final   HDL  Date Value Ref Range Status  12/27/2022 47 >39 mg/dL Final   Triglycerides  Date Value Ref Range Status  12/27/2022 102 0 - 149 mg/dL Final         Passed - Patient is not pregnant      Passed - Valid encounter within last 12 months    Recent Outpatient Visits           2 weeks ago COPD exacerbation (HCC)   [place] [facility] Malva Limes, MD   3 months ago Leg cramp   [place] [facility] Malva Limes, MD   9 months ago Acute recurrent frontal sinusitis   [place] [facility] Malva Limes, MD   10 months ago Iron deficiency   [facility] Malva Limes, MD   11 months ago Other fatigue   [place] [facility] Malva Limes, MD       Future Appointments             In 1 week Salena Saner, MD [facility] [place] at [facility]             hydroxychloroquine (PLAQUENIL) 200 MG tablet 60 tablet 3    Sig: Take 2 tablets (400 mg total) by mouth daily.     Not Delegated - Analgesics:  Antirheumatic Agents - abatacept & hydroxychloroquine Failed - 04/17/2023  5:38 PM      Failed - This refill  cannot be delegated      Passed - Valid encounter within last 6 months    Recent Outpatient Visits           2 weeks ago COPD exacerbation (HCC)   [place] [facility] Malva Limes, MD   3 months ago Leg cramp   [facility] Malva Limes, MD   9 months ago Acute recurrent frontal sinusitis   [place] [facility] Malva Limes, MD   10 months ago Iron deficiency   [place] [facility] Malva Limes, MD   11 months ago Other fatigue   [place] [facility] Malva Limes, MD       Future Appointments             In 1 week Salena Saner, MD [facility] Care at              clonazePAM [facility]) 1 MG tablet 60 tablet 2     Not Delegated -  Psychiatry: Anxiolytics/Hypnotics 2 Failed - 04/17/2023  5:38 PM      Failed - This refill cannot be delegated      Failed - Urine Drug Screen completed in last 360 days      Passed - Patient is not pregnant      Passed - Valid encounter within last 6 months    Recent Outpatient Visits           2 weeks ago COPD exacerbation (HCC)   [place] [facility] Malva Limes, MD   3 months ago Leg cramp   [facility] Malva Limes, MD   9 months ago Acute recurrent frontal sinusitis   [facility] Malva Limes, MD   10 months ago Iron deficiency   [facility] Malva Limes, MD   11 months ago Other fatigue   [facility] Malva Limes, MD       Future Appointments             In 1 week Salena Saner, MD [facility] Pulmonary Care at [facility]

## 2023-04-17 NOTE — Addendum Note (Signed)
Addended by: Bonney Leitz on: 04/17/2023 07:58 AM   Modules accepted: Orders

## 2023-04-18 ENCOUNTER — Telehealth: Payer: 59 | Admitting: Family Medicine

## 2023-04-18 MED ORDER — ATORVASTATIN CALCIUM 40 MG PO TABS: 40.0000 mg | ORAL_TABLET | Freq: Every day | ORAL | 1 refills | Status: DC

## 2023-04-18 MED ORDER — HYDROXYCHLOROQUINE SULFATE 200 MG PO TABS
400.0000 mg | ORAL_TABLET | Freq: Every day | ORAL | 3 refills | Status: DC
Start: 2023-04-18 — End: 2023-06-20

## 2023-04-20 MED ORDER — CLONAZEPAM 1 MG PO TABS: ORAL_TABLET | ORAL | 1 refills | Status: DC

## 2023-04-25 ENCOUNTER — Ambulatory Visit: Payer: 59 | Admitting: Pulmonary Disease

## 2023-04-25 ENCOUNTER — Encounter: Payer: Self-pay | Admitting: Pulmonary Disease

## 2023-04-25 VITALS — BP 110/80 | HR 78 | Temp 96.9°F | Ht 62.0 in | Wt 135.2 lb

## 2023-04-25 DIAGNOSIS — J4489 Other specified chronic obstructive pulmonary disease: Secondary | ICD-10-CM | POA: Diagnosis not present

## 2023-04-25 DIAGNOSIS — J449 Chronic obstructive pulmonary disease, unspecified: Secondary | ICD-10-CM | POA: Diagnosis not present

## 2023-04-25 DIAGNOSIS — K219 Gastro-esophageal reflux disease without esophagitis: Secondary | ICD-10-CM

## 2023-04-25 DIAGNOSIS — G4736 Sleep related hypoventilation in conditions classified elsewhere: Secondary | ICD-10-CM

## 2023-04-25 DIAGNOSIS — J9621 Acute and chronic respiratory failure with hypoxia: Secondary | ICD-10-CM | POA: Diagnosis not present

## 2023-04-25 NOTE — Progress Notes (Signed)
 Subjective:    Patient ID: Andrea [name], female    DOB: 06-03-1965, 58 y.o.   MRN: [id number]  Patient Care Team: Malva Limes, MD as PCP - General (Family Medicine) Salena Saner, MD as Consulting Physician (Pulmonary Disease)  Chief Complaint  Patient presents with   Follow-up    Had the flu 2 weeks ago. Cough. DOE. No wheezing.     BACKGROUND/INTERVAL: Andrea [name] is a 58 year old current smoker (half PPD, 20 PY) with stage II COPD who presents for follow-up on the same. This is a scheduled visit.  He was last seen 07 January 2023.  Had a recent exacerbation for which she was started on prednisone taper and azithromycin on 21 January 2023.  This is a scheduled visit.   HPI Discussed the use of AI scribe software for clinical note transcription with the patient, who gave verbal consent to proceed.  History of Present Illness   Andrea [name] is a 59 year old female with COPD and asthmatic bronchitis who presents for follow-up.  She has been managing her COPD with asthmatic bronchitis using Breztri, which she finds effective. Recently, she contracted the flu, exacerbating her respiratory symptoms and necessitating the use of an albuterol inhaler and nebulizer solution.  She had had difficulties obtaining Breztri due to shortages in her local pharmacy.  She is coordinating with her insurance company to manage her medication supply through mail order for convenience and cost-effectiveness.  She experiences a persistent cough and hoarseness for the past two weeks.  However this is improving.  She has no purulent sputum production.  No hemoptysis.  She smokes about a fourth of a pack of cigarettes daily and is attempting to quit, acknowledging the impact of the flu on her health.  She needs a new nebulizer machine.  Compliant with nocturnal oxygen and notes benefit of the therapy.  She continues to experience symptoms of gastroesophageal reflux, which she feels were not  adequately addressed in previous treatment attempts.  She has had Nissen fundoplication.  She is on Dexilant for reflux symptoms.    DATA 06/18/2016 PFTs: FEV1 1.45 L or 60% predicted VC 2.38 L or 90% predicted FEV1/FVC 61%.  Lung volumes: TLC 102%, RV 140%. DLCO 57%.  No significant bronchodilator response. 09/25/2016 2D echo: LVEF 60 to 65%, normal study. 09/01/2020 overnight oximetry: No significant desaturations. 01/15/2021 PFTs: FEV1 1.46 L or 57% predicted, FVC 2.31 L or 70% predicted, FEV1/FVC 63%, no bronchodilator response. TLC 113, RV 200% diffusion capacity moderately reduced. 06/11/2021 echocardiogram: LVEF 60 to 65%, no regional wall motion abnormality, diastolic send normal, right heart function normal.  No valvular abnormalities.   Review of Systems A 10 point review of systems was performed and it is as noted above otherwise negative.   Patient Active Problem List   Diagnosis Date Noted   Hiatal hernia 03/28/2022   S/P repair of paraesophageal hernia 03/28/2022   Chronic anterior uveitis of left eye 10/04/2021   History of colonic polyps    Varicose veins of bilateral lower extremities with pain 11/22/2016   Restless leg 11/18/2016   Iron deficiency 11/18/2016   Allergic rhinitis 06/11/2016   Hyperlipidemia 03/19/2015   COPD (chronic obstructive pulmonary disease) (HCC) 10/04/2014   Depression 09/22/2014   Dyshidrosis 09/22/2014   GERD (gastroesophageal reflux disease) 09/22/2014   Insomnia 09/22/2014   Lung nodule, multiple 09/22/2014   Panic disorder 09/22/2014   Palpitations 01/17/2014   Pulmonary nodule 06/10/2013   Shortness of breath 05/20/2013  COPD, GOLD B 05/20/2013   Tobacco abuse 05/20/2013   Daytime somnolence 05/20/2013   Back pain 08/28/2012   Discoid lupus 05/30/2012   Pain in joint 03/09/2012   Psoriasis 02/21/2012   Fibromyalgia 02/11/2012   Lupus 02/11/2012   Panic attacks 02/11/2012   History of adenomatous polyp of colon 09/13/2011    Heartburn 08/06/2011   PVC's (premature ventricular contractions) 01/29/2011   Essential (primary) hypertension 03/04/1998    Social History   Tobacco Use   Smoking status: Every Day    Current packs/day: 1.00    Average packs/day: 1 pack/day for 20.0 years (20.0 ttl pk-yrs)    Types: Cigarettes    Passive exposure: Past   Smokeless tobacco: Never   Tobacco comments:    0.25 PPD -04/25/2023 khj   Substance Use Topics   Alcohol use: No    Alcohol/week: 0.0 standard drinks of alcohol    Allergies  Allergen Reactions   Alum & Mag Hydroxide-Simeth Diarrhea and Nausea Only   Bactrim [Sulfamethoxazole-Trimethoprim] Itching   Cefdinir Other (See Comments)    tachycardia   Chantix [Varenicline]     Bad dreams   Chlorhexidine Gluconate Swelling   Doxycycline Other (See Comments)    Nausea, migraine   Fentanyl Nausea And Vomiting   Multaq [Dronedarone] Other (See Comments)    Lip/ mouth numbness/ tongue swelling   Ivp Dye [Iodinated Contrast Media] Palpitations    Current Meds  Medication Sig   albuterol (PROVENTIL) (2.5 MG/3ML) 0.083% nebulizer solution USE 1 VIAL IN NEBULIZER EVERY 4 (FOUR) HOURS AS NEEDED FOR WHEEZING OR SHORTNESS OF BREATH.   albuterol (VENTOLIN HFA) 108 (90 Base) MCG/ACT inhaler Inhale 2 puffs into the lungs every 6 (six) hours as needed for wheezing or shortness of breath.   atorvastatin (LIPITOR) 40 MG tablet Take 1 tablet (40 mg total) by mouth daily.   Budeson-Glycopyrrol-Formoterol (BREZTRI AEROSPHERE) 160-9-4.8 MCG/ACT AERO Inhale 2 puffs into the lungs in the morning and at bedtime.   clonazePAM (KLONOPIN) 1 MG tablet TAKE 1 TABLET BY MOUTH 2 TIMES DAILY AS NEEDED. DNF UNTIL 11/3   dexlansoprazole (DEXILANT) 60 MG capsule Take 1 capsule (60 mg total) by mouth daily.   fluticasone (FLONASE) 50 MCG/ACT nasal spray Place 2 sprays into both nostrils as needed.   hydroxychloroquine (PLAQUENIL) 200 MG tablet Take 2 tablets (400 mg total) by mouth daily.    Misc. Devices (PULSE OXIMETER DELUXE) MISC Use daily to monitor oxygen levels   Nebulizers (COMPRESSOR/NEBULIZER) MISC Supplies only   Nebulizers (COMPRESSOR/NEBULIZER) MISC Use as directed   nitroGLYCERIN (NITROSTAT) 0.4 MG SL tablet Place 1 tablet (0.4 mg total) under the tongue every 5 (five) minutes as needed for chest pain.   OXYGEN Inhale 2 L into the lungs as needed (at while sleeping).   oxymetazoline (AFRIN NASAL SPRAY) 0.05 % nasal spray 2 sprays per nare for acute nasal bleeding/epistaxis   prednisoLONE sodium phosphate (INFLAMASE FORTE) 1 % ophthalmic solution Place 1 drop into the left eye as needed.   Respiratory Therapy Supplies (FLUTTER) DEVI 1 Device by Does not apply route daily.   Spacer/Aero-Holding Chambers (AEROCHAMBER MV) inhaler Use as instructed   Spacer/Aero-Holding Chambers (AEROCHAMBER MV) inhaler Use as instructed    Immunization History  Administered Date(s) Administered   Moderna Sars-Covid-2 Vaccination 11/28/2019, 12/26/2019   Pneumococcal Conjugate-13 11/20/2012   Pneumococcal Polysaccharide-23 08/10/2012        Objective:     BP 110/80 (BP Location: Right Arm, Cuff Size: Normal)   Pulse  78   Temp (!) 96.9 F (36.1 C)   Ht 5\' 2"  (1.575 m)   Wt 135 lb 3.2 oz (61.3 kg)   SpO2 94%   BMI 24.73 kg/m   SpO2: 94 % O2 Device: None (Room air)  GENERAL: Awake, alert, fully ambulatory.  No respiratory distress.  No conversational dyspnea.   HEAD: Normocephalic, atraumatic.  EYES: Pupils equal, round, reactive to light.  No scleral icterus.  MOUTH: Dentures upper and lower. NECK: Supple. No thyromegaly. Trachea midline. No JVD.  No adenopathy. PULMONARY: Good air entry bilaterally.  There are coarse breath sounds throughout, no wheezes. CARDIOVASCULAR: S1 and S2. Regular rate and rhythm.  No rubs, murmurs or gallops heard. ABDOMEN: Benign. MUSCULOSKELETAL: No joint deformity, no clubbing, no edema.  NEUROLOGIC: No overt focal deficit.  Speech is  fluent.  No gait disturbance. SKIN: Intact,warm,dry. PSYCH: Normal mood and behavior.   Assessment & Plan:     ICD-10-CM   1. Stage 2 moderate COPD by GOLD classification (HCC)  J44.9 AMB REFERRAL FOR DME    2. Nocturnal hypoxemia due to obstructive chronic bronchitis (HCC)  J44.89    G47.36     3. Chronic GERD  K21.9       Orders Placed This Encounter  Procedures   AMB REFERRAL FOR DME    Referral Priority:   Routine    Referral Type:   Durable Medical Equipment Purchase    Number of Visits Requested:   1   Discussion:    Chronic Obstructive Pulmonary Disease (COPD) with Asthmatic Bronchitis Abella Grudzinski follows up for COPD with asthmatic bronchitis, exacerbated by a recent flu. She has been using Breztri effectively. Her oxygen saturation was 94% this morning, typically around 97%. She reports a persistent cough and hoarseness for two weeks. She is currently smoking about a fourth of a pack of cigarettes per day and is attempting to quit. She prefers to continue Champ.  Feels overall she is improving after episode of flu 2 weeks ago. - Continue Breztri - Follow up in three months  Smoking Cessation Kennie is currently smoking about a fourth of a pack of cigarettes per day and is trying to quit. Smoking cessation is crucial for managing her COPD. - Encourage smoking cessation  Gastroesophageal Reflux Disease (GERD) Merary  reports ongoing symptoms of reflux.  Symptoms recurred after Nissen fundoplication. - Monitor GERD symptoms - Continue Dexilant  - Continue follow-up with surgeon/GI Follow-up - Schedule follow-up appointment in three months.     Advised if symptoms do not improve or worsen, to please contact office for sooner follow up or seek emergency care.    I spent 33 minutes of dedicated to the care of this patient on the date of this encounter to include pre-visit review of records, face-to-face time with the patient discussing conditions above, post visit  ordering of testing, clinical documentation with the electronic health record, making appropriate referrals as documented, and communicating necessary findings to members of the patients care team.    C. Danice Goltz, MD Advanced Bronchoscopy PCCM [facility]    *This note was generated using voice recognition software/Dragon and/or AI transcription program.  Despite best efforts to proofread, errors can occur which can change the meaning. Any transcriptional errors that result from this process are unintentional and may not be fully corrected at the time of dictation.

## 2023-04-25 NOTE — Patient Instructions (Signed)
 VISIT SUMMARY:  Today, you had a follow-up appointment to discuss your COPD with asthmatic bronchitis, smoking cessation, and gastroesophageal reflux disease (GERD). We reviewed your current medications and symptoms, and discussed your recent flu and its impact on your respiratory health. We also talked about your efforts to quit smoking and your ongoing GERD symptoms.  YOUR PLAN:  -CHRONIC OBSTRUCTIVE PULMONARY DISEASE (COPD) WITH ASTHMATIC BRONCHITIS: COPD with asthmatic bronchitis is a chronic lung condition that makes it hard to breathe. You have been managing it well with Breztri, and we will continue this medication. Your recent flu has worsened your symptoms, but your oxygen levels are stable. We will follow up in three months to monitor your progress.  -SMOKING CESSATION: Quitting smoking is essential for improving your lung health and managing COPD. You are currently smoking a fourth of a pack per day and are trying to quit. We encourage you to continue your efforts to stop smoking.  -GASTROESOPHAGEAL REFLUX DISEASE (GERD): GERD is a condition where stomach acid frequently flows back into the tube connecting your mouth and stomach, causing discomfort. You have ongoing symptoms, and we will monitor them to find an effective treatment.  INSTRUCTIONS:  Please schedule a follow-up appointment in three months to review your COPD, smoking cessation progress, and GERD symptoms.

## 2023-04-27 ENCOUNTER — Other Ambulatory Visit: Payer: Self-pay | Admitting: Family Medicine

## 2023-04-29 ENCOUNTER — Encounter: Payer: Self-pay | Admitting: Pulmonary Disease

## 2023-04-30 MED ORDER — ALBUTEROL SULFATE HFA 108 (90 BASE) MCG/ACT IN AERS: 2.0000 | INHALATION_SPRAY | Freq: Four times a day (QID) | RESPIRATORY_TRACT | 3 refills | Status: DC | PRN

## 2023-05-01 ENCOUNTER — Ambulatory Visit: Payer: 59 | Admitting: Pulmonary Disease

## 2023-05-02 DIAGNOSIS — J449 Chronic obstructive pulmonary disease, unspecified: Secondary | ICD-10-CM | POA: Diagnosis not present

## 2023-05-08 DIAGNOSIS — J449 Chronic obstructive pulmonary disease, unspecified: Secondary | ICD-10-CM | POA: Diagnosis not present

## 2023-05-22 ENCOUNTER — Telehealth: Admitting: Physician Assistant

## 2023-05-22 DIAGNOSIS — H6691 Otitis media, unspecified, right ear: Secondary | ICD-10-CM | POA: Diagnosis not present

## 2023-05-22 MED ORDER — AZITHROMYCIN 250 MG PO TABS: ORAL_TABLET | ORAL | 0 refills | Status: AC

## 2023-05-22 NOTE — Progress Notes (Signed)
E-Visit for Ear Pain - Acute Otitis Media   We are sorry that you are not feeling well. Here is how we plan to help!  Based on what you have shared with me it looks like you have Acute Otitis Media.  Acute Otitis Media is an infection of the middle or "inner" ear. This type of infection can cause redness, inflammation, and fluid buildup behind the tympanic membrane (ear drum).  The usual symptoms include: Earache/Pain Fever Upper respiratory symptoms Lack of energy/Fatigue/Malaise Slight hearing loss gradually worsening- if the inner ear fills with fluid What causes middle ear infections? Most middle ear infections occur when an infection such as a cold, leads to a build-up of mucus in the middle ear and causes the Eustachian tube (a thin tube that runs from the middle ear to the back of the nose) to become swollen or blocked.   This means mucus can't drain away properly, making it easier for an infection to spread into the middle ear.  How middle ear infections are treated: Most ear infections clear up within three to five days and don't need any specific treatment. If necessary, tylenol or ibuprofen should be used to relieve pain and a high temperature.  If you develop a fever higher than 102, or any significantly worsening symptoms, this could indicate a more serious infection moving to the middle/inner and needs face to face evaluation in an office by a provider.   Antibiotics aren't routinely used to treat middle ear infections, although they may occasionally be prescribed if symptoms persist or are particularly severe. Given your presentation,   I have prescribed Azithromycin 250 mg two tablets by mouth on day 1, then 1 tablet by mouth daily until completed    Your symptoms should improve over the next 3 days and should resolve in about 7 days. Be sure to complete ALL of the prescription(s) given.  HOME CARE: Wash your hands frequently. If you are prescribed an ear drop, do not  place the tip of the bottle on your ear or touch it with your fingers. You can take Acetaminophen 650 mg every 4-6 hours as needed for pain.  If pain is severe or moderate, you can apply a heating pad (set on low) or hot water bottle (wrapped in a towel) to outer ear for 20 minutes.  This will also increase drainage.  GET HELP RIGHT AWAY IF: Fever is over 102.2 degrees. You develop progressive ear pain or hearing loss. Ear symptoms persist longer than 3 days after treatment.  MAKE SURE YOU: Understand these instructions. Will watch your condition. Will get help right away if you are not doing well or get worse.  Thank you for choosing an e-visit.  Your e-visit answers were reviewed by a board certified advanced clinical practitioner to complete your personal care plan. Depending upon the condition, your plan could have included both over the counter or prescription medications.  Please review your pharmacy choice. Make sure the pharmacy is open so you can pick up the prescription now. If there is a problem, you may contact your provider through MyChart messaging and have the prescription routed to another pharmacy.  Your safety is important to us. If you have drug allergies check your prescription carefully.   For the next 24 hours you can use MyChart to ask questions about today's visit, request a non-urgent call back, or ask for a work or school excuse. You will get an email with a survey after your eVisit asking about   your experience. We would appreciate your feedback. I hope that your e-visit has been valuable and will aid in your recovery.   

## 2023-05-22 NOTE — Progress Notes (Signed)
 I have spent 5 minutes in review of e-visit questionnaire, review and updating patient chart, medical decision making and response to patient.   Piedad Climes, PA-C

## 2023-05-23 DIAGNOSIS — J9621 Acute and chronic respiratory failure with hypoxia: Secondary | ICD-10-CM | POA: Diagnosis not present

## 2023-05-28 DIAGNOSIS — J449 Chronic obstructive pulmonary disease, unspecified: Secondary | ICD-10-CM | POA: Diagnosis not present

## 2023-06-03 DIAGNOSIS — J449 Chronic obstructive pulmonary disease, unspecified: Secondary | ICD-10-CM | POA: Diagnosis not present

## 2023-06-08 ENCOUNTER — Other Ambulatory Visit: Payer: Self-pay | Admitting: Family Medicine

## 2023-06-14 ENCOUNTER — Other Ambulatory Visit: Payer: Self-pay | Admitting: Family Medicine

## 2023-06-14 ENCOUNTER — Telehealth: Admitting: Family

## 2023-06-14 DIAGNOSIS — J441 Chronic obstructive pulmonary disease with (acute) exacerbation: Secondary | ICD-10-CM | POA: Diagnosis not present

## 2023-06-14 MED ORDER — BENZONATATE 100 MG PO CAPS: 100.0000 mg | ORAL_CAPSULE | Freq: Three times a day (TID) | ORAL | 0 refills | Status: DC | PRN

## 2023-06-14 MED ORDER — PREDNISONE 10 MG (21) PO TBPK: ORAL_TABLET | ORAL | 0 refills | Status: DC

## 2023-06-14 NOTE — Progress Notes (Signed)
 E-Visit for Cough   We are sorry that you are not feeling well.  Here is how we plan to help!  Based on your presentation I believe you most likely have A cough due to reflux. To lessen this cough you can use the over-the counter cough medication Delsym but it is very important that we control the cause of the cough.  I suggest that you begin Prilosec 20 mg twice a day for 2 weeks.  You should see the cough lessen quickly as your reflux ( which may be silent or without burning ) is treated.    In addition you may use A non-prescription cough medication called Robitussin DAC. Take 2 teaspoons every 8 hours or Delsym: take 2 teaspoons every 12 hours., A non-prescription cough medication called Mucinex DM: take 2 tablets every 12 hours., and A prescription cough medication called Tessalon Perles 100mg . You may take 1-2 capsules every 8 hours as needed for your cough.  Prednisone 10 mg daily for 6 days (see taper instructions below)  Directions for 6 day taper: Day 1: 2 tablets before breakfast, 1 after both lunch & dinner and 2 at bedtime Day 2: 1 tab before breakfast, 1 after both lunch & dinner and 2 at bedtime Day 3: 1 tab at each meal & 1 at bedtime Day 4: 1 tab at breakfast, 1 at lunch, 1 at bedtime Day 5: 1 tab at breakfast & 1 tab at bedtime Day 6: 1 tab at breakfast  From your responses in the eVisit questionnaire you describe inflammation in the upper respiratory tract which is causing a significant cough.  This is commonly called Bronchitis and has four common causes:   Allergies Viral Infections Acid Reflux Bacterial Infection Allergies, viruses and acid reflux are treated by controlling symptoms or eliminating the cause. An example might be a cough caused by taking certain blood pressure medications. You stop the cough by changing the medication. Another example might be a cough caused by acid reflux. Controlling the reflux helps control the cough.  USE OF BRONCHODILATOR ("RESCUE")  INHALERS: There is a risk from using your bronchodilator too frequently.  The risk is that over-reliance on a medication which only relaxes the muscles surrounding the breathing tubes can reduce the effectiveness of medications prescribed to reduce swelling and congestion of the tubes themselves.  Although you feel brief relief from the bronchodilator inhaler, your asthma may actually be worsening with the tubes becoming more swollen and filled with mucus.  This can delay other crucial treatments, such as oral steroid medications. If you need to use a bronchodilator inhaler daily, several times per day, you should discuss this with your provider.  There are probably better treatments that could be used to keep your asthma under control.     HOME CARE Only take medications as instructed by your medical team. Complete the entire course of an antibiotic. Drink plenty of fluids and get plenty of rest. Avoid close contacts especially the very young and the elderly Cover your mouth if you cough or cough into your sleeve. Always remember to wash your hands A steam or ultrasonic humidifier can help congestion.   GET HELP RIGHT AWAY IF: You develop worsening fever. You become short of breath You cough up blood. Your symptoms persist after you have completed your treatment plan MAKE SURE YOU  Understand these instructions. Will watch your condition. Will get help right away if you are not doing well or get worse.    Thank you  for choosing an e-visit.  Your e-visit answers were reviewed by a board certified advanced clinical practitioner to complete your personal care plan. Depending upon the condition, your plan could have included both over the counter or prescription medications.  Please review your pharmacy choice. Make sure the pharmacy is open so you can pick up prescription now. If there is a problem, you may contact your provider through [employer] and have the prescription routed to  another pharmacy.  Your safety is important to us . If you have drug allergies check your prescription carefully.   For the next 24 hours you can use MyChart to ask questions about today's visit, request a non-urgent call back, or ask for a work or school excuse. You will get an email in the next two days asking about your experience. I hope that your e-visit has been valuable and will speed your recovery.   Approximately 5 minutes was spent documenting and reviewing patient's chart.

## 2023-06-18 ENCOUNTER — Encounter: Payer: Self-pay | Admitting: Family Medicine

## 2023-06-18 DIAGNOSIS — J449 Chronic obstructive pulmonary disease, unspecified: Secondary | ICD-10-CM | POA: Diagnosis not present

## 2023-06-19 ENCOUNTER — Other Ambulatory Visit: Payer: Self-pay | Admitting: Family Medicine

## 2023-06-19 DIAGNOSIS — L930 Discoid lupus erythematosus: Secondary | ICD-10-CM

## 2023-06-20 NOTE — Telephone Encounter (Signed)
 Requested medication (s) are due for refill today - no  Requested medication (s) are on the active medication list -yes  Future visit scheduled -no  Last refill: 04/18/23 #60 [id number]  Notes to clinic: non delegated Rx  Requested Prescriptions  Pending Prescriptions Disp Refills   hydroxychloroquine  (PLAQUENIL ) 200 MG tablet [Pharmacy Med Name: Hydroxychloroquine  Sulfate 200 MG Oral Tablet] 200 tablet 2    Sig: TAKE 2 TABLETS BY MOUTH DAILY     Not Delegated - Analgesics:  Antirheumatic Agents - abatacept & hydroxychloroquine  Failed - 06/20/2023 12:10 PM      Failed - This refill cannot be delegated      Failed - Valid encounter within last 6 months    Recent Outpatient Visits           2 months ago COPD exacerbation (HCC)   [place] [facility] [name], MD                 Requested Prescriptions  Pending Prescriptions Disp Refills   hydroxychloroquine  (PLAQUENIL ) 200 MG tablet [Pharmacy Med Name: Hydroxychloroquine  Sulfate 200 MG Oral Tablet] 200 tablet 2    Sig: TAKE 2 TABLETS BY MOUTH DAILY     Not Delegated - Analgesics:  Antirheumatic Agents - abatacept & hydroxychloroquine  Failed - 06/20/2023 12:10 PM      Failed - This refill cannot be delegated      Failed - Valid encounter within last 6 months    Recent Outpatient Visits           2 months ago COPD exacerbation [facility])   [place] [facility] [name], MD

## 2023-06-23 DIAGNOSIS — J9621 Acute and chronic respiratory failure with hypoxia: Secondary | ICD-10-CM | POA: Diagnosis not present

## 2023-06-30 ENCOUNTER — Telehealth: Payer: Self-pay | Admitting: Family Medicine

## 2023-06-30 MED ORDER — MONTELUKAST SODIUM 10 MG PO TABS: 10.0000 mg | ORAL_TABLET | Freq: Every day | ORAL | 1 refills | Status: DC

## 2023-06-30 NOTE — Telephone Encounter (Signed)
 [facility] pharmacy faxed a request for the following medications:  Montelukast  SOD 10mg  Tablet  Not on refill list//New prescription//Please advise

## 2023-06-30 NOTE — Addendum Note (Signed)
 Addended by: Lamon Pillow on: 06/30/2023 04:54 PM   Modules accepted: Orders

## 2023-07-09 DIAGNOSIS — J449 Chronic obstructive pulmonary disease, unspecified: Secondary | ICD-10-CM | POA: Diagnosis not present

## 2023-07-13 ENCOUNTER — Encounter: Payer: Self-pay | Admitting: Physician Assistant

## 2023-07-13 ENCOUNTER — Telehealth: Admitting: Physician Assistant

## 2023-07-13 DIAGNOSIS — J441 Chronic obstructive pulmonary disease with (acute) exacerbation: Secondary | ICD-10-CM | POA: Diagnosis not present

## 2023-07-13 MED ORDER — BENZONATATE 100 MG PO CAPS: 100.0000 mg | ORAL_CAPSULE | Freq: Three times a day (TID) | ORAL | 0 refills | Status: DC | PRN

## 2023-07-13 MED ORDER — AZITHROMYCIN 250 MG PO TABS: ORAL_TABLET | ORAL | 0 refills | Status: AC

## 2023-07-13 MED ORDER — PREDNISONE 10 MG (21) PO TBPK: ORAL_TABLET | ORAL | 0 refills | Status: DC

## 2023-07-13 NOTE — Progress Notes (Signed)
 E-Visit for Cough     We are sorry that you are not feeling well.  Here is how we plan to help!   Based on your presentation I believe you most likely have A cough due to bacteria.  When patients have a fever and a productive cough with a change in color or increased sputum production, we are concerned about bacterial bronchitis.  If left untreated it can progress to pneumonia.  If your symptoms do not improve with your treatment plan it is important that you contact your provider.   I have prescribed Azithromyin 250 mg: two tablets now and then one tablet daily for 4 additonal days    In addition you may use A prescription cough medication called Tessalon  Perles 100mg . You may take 1-2 capsules every 8 hours as needed for your cough.   Along with a steroid taper Directions for 6 day taper: Day 1: 2 tablets before breakfast, 1 after both lunch & dinner and 2 at bedtime Day 2: 1 tab before breakfast, 1 after both lunch & dinner and 2 at bedtime Day 3: 1 tab at each meal & 1 at bedtime Day 4: 1 tab at breakfast, 1 at lunch, 1 at bedtime Day 5: 1 tab at breakfast & 1 tab at bedtime Day 6: 1 tab at breakfast   From your responses in the eVisit questionnaire you describe inflammation in the upper respiratory tract which is causing a significant cough.  This is commonly called Bronchitis and has four common causes:   Allergies Viral Infections Acid Reflux Bacterial Infection Allergies, viruses and acid reflux are treated by controlling symptoms or eliminating the cause. An example might be a cough caused by taking certain blood pressure medications. You stop the cough by changing the medication. Another example might be a cough caused by acid reflux. Controlling the reflux helps control the cough.   USE OF BRONCHODILATOR ("RESCUE") INHALERS: There is a risk from using your bronchodilator too frequently.  The risk is that over-reliance on a medication which only relaxes the muscles surrounding the  breathing tubes can reduce the effectiveness of medications prescribed to reduce swelling and congestion of the tubes themselves.  Although you feel brief relief from the bronchodilator inhaler, your asthma may actually be worsening with the tubes becoming more swollen and filled with mucus.  This can delay other crucial treatments, such as oral steroid medications. If you need to use a bronchodilator inhaler daily, several times per day, you should discuss this with your provider.  There are probably better treatments that could be used to keep your asthma under control.                HOME CARE Only take medications as instructed by your medical team. Complete the entire course of an antibiotic. Drink plenty of fluids and get plenty of rest. Avoid close contacts especially the very young and the elderly Cover your mouth if you cough or cough into your sleeve. Always remember to wash your hands A steam or ultrasonic humidifier can help congestion.    GET HELP RIGHT AWAY IF: You develop worsening fever. You become short of breath You cough up blood. Your symptoms persist after you have completed your treatment plan MAKE SURE YOU  Understand these instructions. Will watch your condition. Will get help right away if you are not doing well or get worse.     Thank you for choosing an e-visit.   Your e-visit answers were reviewed by a  board certified advanced clinical practitioner to complete your personal care plan. Depending upon the condition, your plan could have included both over the counter or prescription medications.   Please review your pharmacy choice. Make sure the pharmacy is open so you can pick up prescription now. If there is a problem, you may contact your provider through [employer] and have the prescription routed to another pharmacy.  Your safety is important to us . If you have drug allergies check your prescription carefully.    For the next 24 hours you can use  MyChart to ask questions about today's visit, request a non-urgent call back, or ask for a work or school excuse. You will get an email in the next two days asking about your experience. I hope that your e-visit has been valuable and will speed your recovery.     I have spent 5 minutes in review of e-visit questionnaire, review and updating patient chart, medical decision making and response to patient.   Etter Hermann Mayers, PA-C

## 2023-07-15 ENCOUNTER — Other Ambulatory Visit: Payer: Self-pay | Admitting: Family Medicine

## 2023-07-15 DIAGNOSIS — E785 Hyperlipidemia, unspecified: Secondary | ICD-10-CM

## 2023-07-20 DIAGNOSIS — R051 Acute cough: Secondary | ICD-10-CM | POA: Diagnosis not present

## 2023-07-20 DIAGNOSIS — R0981 Nasal congestion: Secondary | ICD-10-CM | POA: Diagnosis not present

## 2023-07-20 DIAGNOSIS — H66002 Acute suppurative otitis media without spontaneous rupture of ear drum, left ear: Secondary | ICD-10-CM | POA: Diagnosis not present

## 2023-07-21 ENCOUNTER — Ambulatory Visit: Payer: Self-pay | Admitting: Pulmonary Disease

## 2023-07-21 NOTE — Telephone Encounter (Signed)
 Given that she is already using her albuterol  nebulizers every 4 hours and is on antibiotics and still symptomatic I recommend that she go to the ED she may need imaging/further intervention.

## 2023-07-21 NOTE — Telephone Encounter (Signed)
 Copied from CRM [id number]. Topic: Clinical - Red Word Triage >> Jul 21, 2023  2:09 PM Juliana Ocean wrote: Red Word that prompted transfer to Nurse Triage: wheezing/moved into chest over weekend.  [facility] said it was viral. Pt has started cough/chest rattle  TRIAGE SUMMARY NOTE: Pt reporting that she was seen by UC over the weekend and diagnosed with ear infection, started amoxicillin  yesterday, symptoms had been "all in my head" but now seem to have gone into chest, pt coughing a lot and wheezing intermittently, pt confirms more SOB than normal with exertion, "sit down I'm fine." Pt confirms she is on 1L oxygen  at night but having to use oxygen  therapy more often, using her albuterol  nebulizer every 4 hours and her rescue inhaler at night. Advised pt be examined in next 4 hours, offered appts in [place] today with other pulm doc, pt declines at this time due to proximity, no PCP availability today, pt states she will go to ED if she has to. Advised could try rescue treatments of albuterol  inhaler 2-3x 20 min apart, if no improvement after rescue treatments or if worsening go to ED. Advised that placed Friday follow-up appt on waitlist, advised sending HP message to pulm for call back with further recommendations as appropriate. Pt verbalized understanding. Please advise.  E2C2 Pulmonary Triage - Initial Assessment Questions "Chief Complaint (e.g., cough, sob, wheezing, fever, chills, sweat or additional symptoms) *Go to specific symptom protocol after initial questions. Coughing fit over phone UC over weekend was all ear infection and in head, moved into chest over weekend into cough and wheezing Wheezing sometimes More SOB than normal when get up and move around, fine if sit down No dizziness, weakness, chest pain Amoxicillin  for the ear started yesterday  "How long have symptoms been present?" Since weekend  MEDICINES:   "Have you used any OTC meds to help with symptoms?" Yes If yes, ask "What  medications?" mucinex   "Have you used your inhalers/maintenance medication?" Yes If yes, "What medications?" Albuterol  nebulizer every 4 hours, albuterol  inhaler just mainly at night  OXYGEN : "Do you wear supplemental oxygen ?" Yes If yes, "How many liters are you supposed to use?" At night 1L, been using more often  "Do you monitor your oxygen  levels?" No If yes, "What is your reading (oxygen  level) today?" Yesterday 95% oxygen  all night long and breztri  morning appt  Reason for Disposition  [1] Continuous (nonstop) coughing AND [2] keeps from working or sleeping AND [3] not improved after 2 or 3 inhaler or nebulizer treatments given 20 minutes apart  Answer Assessment - Initial Assessment Questions 6. ASTHMA MEDICINES:  "What treatments have you tried?"    - INHALED QUICK RELIEF (RESCUE): "What is your inhaled quick-relief medicine?" (e.g., albuterol , salbutamol) "Do you use an inhaler or a nebulizer?" "How frequently have you been using this medicine?"   - CONTROLLER (LONG-TERM-CONTROL): "Do you take an inhaled steroid? (e.g., Asmanex , Flovent , Pulmicort , Qvar)     Albuterol  nebulizer every 4 hours, albuterol  inhaler at night  Protocols used: Asthma Attack-A-AH

## 2023-07-21 NOTE — Telephone Encounter (Signed)
 I have notified the patient. Nothing further needed.

## 2023-07-22 ENCOUNTER — Emergency Department
Admission: EM | Admit: 2023-07-22 | Discharge: 2023-07-22 | Disposition: A | Discharge status: Home / Self Care | Attending: Emergency Medicine | Admitting: Emergency Medicine

## 2023-07-22 ENCOUNTER — Encounter: Payer: Self-pay | Admitting: Emergency Medicine

## 2023-07-22 ENCOUNTER — Other Ambulatory Visit: Payer: Self-pay

## 2023-07-22 ENCOUNTER — Emergency Department

## 2023-07-22 DIAGNOSIS — J449 Chronic obstructive pulmonary disease, unspecified: Secondary | ICD-10-CM | POA: Diagnosis not present

## 2023-07-22 DIAGNOSIS — R059 Cough, unspecified: Secondary | ICD-10-CM | POA: Diagnosis not present

## 2023-07-22 DIAGNOSIS — I10 Essential (primary) hypertension: Secondary | ICD-10-CM | POA: Insufficient documentation

## 2023-07-22 DIAGNOSIS — J069 Acute upper respiratory infection, unspecified: Secondary | ICD-10-CM | POA: Diagnosis not present

## 2023-07-22 DIAGNOSIS — I700 Atherosclerosis of aorta: Secondary | ICD-10-CM | POA: Diagnosis not present

## 2023-07-22 DIAGNOSIS — R062 Wheezing: Secondary | ICD-10-CM | POA: Diagnosis not present

## 2023-07-22 LAB — RESP PANEL BY RT-PCR (RSV, FLU A&B, COVID)  RVPGX2
Influenza A by PCR: NEGATIVE
Influenza B by PCR: NEGATIVE
Resp Syncytial Virus by PCR: NEGATIVE
SARS Coronavirus 2 by RT PCR: NEGATIVE

## 2023-07-22 MED ORDER — AZITHROMYCIN 250 MG PO TABS: ORAL_TABLET | ORAL | 0 refills | Status: DC

## 2023-07-22 MED ORDER — PREDNISONE 10 MG (21) PO TBPK: ORAL_TABLET | ORAL | 0 refills | Status: DC

## 2023-07-22 NOTE — ED Provider Notes (Signed)
 [facility] Provider Note    Event Date/Time   First MD Initiated Contact with Patient 07/22/23 [phone]     (approximate)   History   Cough, Nasal Congestion, and Wheezing   HPI  Andrea [name] is a 58 y.o. female history of COPD, hypertension, PVCs among other chronic medical problems presents emergency department complaining of head congestion, ear pressure, wheezing.  States mucus is yellow.  Has had a mild cough.  Possible fever over the last 2 days.  Patient is a smoker.  Tried to call her lung doctor and they told her to come to the ED      Physical Exam   Triage Vital Signs: ED Triage Vitals  Encounter Vitals Group     BP 07/22/23 0832 (!) 105/57     Systolic BP Percentile --      Diastolic BP Percentile --      Pulse Rate 07/22/23 0832 84     Resp 07/22/23 0832 16     Temp 07/22/23 0832 98.1 F (36.7 C)     Temp Source 07/22/23 0832 Oral     SpO2 07/22/23 0832 95 %     Weight 07/22/23 0833 135 lb (61.2 kg)     Height 07/22/23 0833 5\' 3"  (1.6 m)     Head Circumference --      Peak Flow --      Pain Score 07/22/23 0832 0     Pain Loc --      Pain Education --      Exclude from Growth Chart --     Most recent vital signs: Vitals:   07/22/23 0832 07/22/23 0911  BP: (!) 105/57 104/65  Pulse: 84 81  Resp: 16 18  Temp: 98.1 F (36.7 C)   SpO2: 95% 93%     General: Awake, no distress.   CV:  Good peripheral perfusion. regular rate and  rhythm Resp:  Normal effort. Lungs CTA Abd:  No distention.   Other:      ED Results / Procedures / Treatments   Labs (all labs ordered are listed, but only abnormal results are displayed) Labs Reviewed  RESP PANEL BY RT-PCR (RSV, FLU A&B, COVID)  [id number]     EKG     RADIOLOGY Chest x-ray    PROCEDURES:   Procedures  Critical Care: None Chief Complaint  Patient presents with   Cough   Nasal Congestion   Wheezing      MEDICATIONS ORDERED IN ED: Medications - No data  to display   IMPRESSION / MDM / ASSESSMENT AND PLAN / ED COURSE  I reviewed the triage vital signs and the nursing notes.                              Differential diagnosis includes, but is not limited to, acute bronchitis, COPD, CAP, COVID, influenza, RSV  Patient's presentation is most consistent with acute illness / injury with system symptoms.   Cardiac monitor no Medications given none  Respiratory panel reassuring, chest x-ray independently reviewed interpreted by me as being negative for any acute abnormality  I did explain these findings to the patient.  She is to follow-up with her regular doctor.  She is given a Z-Pak and steroid pack.  Return emergency department if she is worsening.  Patient is in agreement treatment plan.  Discharged stable condition.         FINAL  CLINICAL IMPRESSION(S) / ED DIAGNOSES   Final diagnoses:  Acute URI     Rx / DC Orders   ED Discharge Orders          Ordered    azithromycin  (ZITHROMAX  Z-PAK) 250 MG tablet        07/22/23 1001    predniSONE  (STERAPRED UNI-PAK 21 TAB) 10 MG (21) TBPK tablet        07/22/23 1001             Note:  This document was prepared using Dragon voice recognition software and may include unintentional dictation errors.    Delsie Figures, PA-C 07/22/23 1054    Ruth Cove, MD 07/22/23 1421

## 2023-07-22 NOTE — ED Triage Notes (Signed)
 Pt states that she has been like this since last Friday and can't get into see her lung dr until this Friday, states that she is coughing, wheezing, congested and can't get rid of it, states that she also can't hear out of her ears

## 2023-07-22 NOTE — ED Notes (Signed)
 Pt is CAOx4, breathing normally, and normal in color. Pt is sitting upright in a chair and is in NAD. Pt reports that she has a hx of COPD and has been sick with respiratory symptoms for a few days. Pt reports no relief from OTC meds or home neb treatments. Pt reports fevers on Saturday and Sunday but unsure on temp due to taking daily tylenol  for chronic headaches.

## 2023-07-23 ENCOUNTER — Ambulatory Visit: Payer: Self-pay | Admitting: Family Medicine

## 2023-07-23 DIAGNOSIS — J9621 Acute and chronic respiratory failure with hypoxia: Secondary | ICD-10-CM | POA: Diagnosis not present

## 2023-07-23 NOTE — Telephone Encounter (Signed)
 Chief Complaint: Productive cough since Saturday  Symptoms: Head congestion, SOB during "coughing spell," a little wheezing Pertinent Negatives: Patient denies fever, sore throat, headache  Disposition: [x] Appointment (In office)  Additional Notes: Patient was seen in ED yesterday. Pt had a chest x-ray and was started on prednisone  and a z-pak. Pt states her symptoms have not worsened since yesterday. Pt has noticed increased sweating since starting the prednisone  but feels like the medication is working. Pt was advised to see her PCP after hospital visit so pt scheduled for first available appt with PCP on Fri, 5/23. This RN educated pt on new-worsening symptoms and when to call back/seek emergent care. Pt verbalized understanding and agrees to plan.    Copied from CRM [id number]. Topic: Clinical - Red Word Triage >> Jul 23, 2023  8:38 AM Andrea [name] wrote: Red Word that prompted transfer to Nurse Triage: sob and wheezing Reason for Disposition  Wheezing is present  Answer Assessment - Initial Assessment Questions Chief Complaint: Productive cough since Saturday  Symptoms: Head congestion, SOB during "coughing spell," a little wheezing  Pertinent Negatives: Patient denies fever, sore throat, headache  Protocols used: Cough - Acute Productive-A-AH

## 2023-07-25 ENCOUNTER — Ambulatory Visit (INDEPENDENT_AMBULATORY_CARE_PROVIDER_SITE_OTHER): Admitting: Family Medicine

## 2023-07-25 ENCOUNTER — Encounter: Payer: Self-pay | Admitting: Family Medicine

## 2023-07-25 ENCOUNTER — Encounter: Payer: Self-pay | Admitting: Pulmonary Disease

## 2023-07-25 ENCOUNTER — Ambulatory Visit: Payer: 59 | Admitting: Pulmonary Disease

## 2023-07-25 VITALS — BP 106/67 | HR 69 | Temp 97.6°F | Resp 16 | Ht 63.0 in | Wt 131.6 lb

## 2023-07-25 VITALS — BP 120/80 | HR 73 | Temp 97.7°F | Ht 63.0 in | Wt 131.0 lb

## 2023-07-25 DIAGNOSIS — J0101 Acute recurrent maxillary sinusitis: Secondary | ICD-10-CM | POA: Diagnosis not present

## 2023-07-25 DIAGNOSIS — J449 Chronic obstructive pulmonary disease, unspecified: Secondary | ICD-10-CM

## 2023-07-25 DIAGNOSIS — J0110 Acute frontal sinusitis, unspecified: Secondary | ICD-10-CM

## 2023-07-25 DIAGNOSIS — J4489 Other specified chronic obstructive pulmonary disease: Secondary | ICD-10-CM

## 2023-07-25 DIAGNOSIS — I10 Essential (primary) hypertension: Secondary | ICD-10-CM | POA: Diagnosis not present

## 2023-07-25 DIAGNOSIS — G4736 Sleep related hypoventilation in conditions classified elsewhere: Secondary | ICD-10-CM

## 2023-07-25 DIAGNOSIS — F17210 Nicotine dependence, cigarettes, uncomplicated: Secondary | ICD-10-CM

## 2023-07-25 DIAGNOSIS — E611 Iron deficiency: Secondary | ICD-10-CM

## 2023-07-25 DIAGNOSIS — E785 Hyperlipidemia, unspecified: Secondary | ICD-10-CM

## 2023-07-25 MED ORDER — AMOXICILLIN-POT CLAVULANATE 875-125 MG PO TABS: 1.0000 | ORAL_TABLET | Freq: Two times a day (BID) | ORAL | 0 refills | Status: AC

## 2023-07-25 MED ORDER — FLUTICASONE PROPIONATE 50 MCG/ACT NA SUSP: 2.0000 | NASAL | 2 refills | Status: AC | PRN

## 2023-07-25 NOTE — Progress Notes (Signed)
 Established patient visit   Patient: Andrea [name]   DOB: 1965/08/01   58 y.o. Female  MRN: [id number] Visit Date: 07/25/2023  Today's healthcare provider: Jeralene Mom, MD   Chief Complaint  Patient presents with   Follow-up   Subjective    HPI  Follow up urgent care visit 5/20 for URI, was prescribed azithromycin  and prednisone  taper. States she has had cough, wheezing, sinus pain , pressure, and yellow discharge. Wheezing better, but still with sinus pressure and headache. Had appointment with Viva Grise this morning and changed azithro to Augmentin  which she has not yet started. She has been on fluticasone  nasal spray in the past but currently out.   She is also due for labs to follow up on anemia, iron deficiency and lipids since atorvastatin  was increased in November.    Lab Results  Component Value Date   IRON 66 12/27/2022   TIBC 392 12/27/2022   FERRITIN 34 12/27/2022   Lab Results  Component Value Date   CHOL 192 12/27/2022   HDL 47 12/27/2022   LDLCALC 127 (H) 12/27/2022   TRIG 102 12/27/2022   CHOLHDL 4.1 12/27/2022     Medications: Outpatient Medications Prior to Visit  Medication Sig   albuterol  (PROVENTIL ) (2.5 MG/3ML) 0.083% nebulizer solution USE 1 VIAL IN NEBULIZER EVERY 4 (FOUR) HOURS AS NEEDED FOR WHEEZING OR SHORTNESS OF BREATH.   albuterol  (VENTOLIN  HFA) 108 (90 Base) MCG/ACT inhaler Inhale 2 puffs into the lungs every 6 (six) hours as needed for wheezing or shortness of breath.   amoxicillin -clavulanate (AUGMENTIN ) 875-125 MG tablet Take 1 tablet by mouth 2 (two) times daily for 7 days.   atorvastatin  (LIPITOR) 40 MG tablet TAKE 1 TABLET BY MOUTH EVERY DAY   azithromycin  (ZITHROMAX  Z-PAK) 250 MG tablet 2 pills today then 1 pill a day for 4 days   benzonatate  (TESSALON  PERLES) 100 MG capsule Take 1 capsule (100 mg total) by mouth 3 (three) times daily as needed.   Budeson-Glycopyrrol-Formoterol  (BREZTRI  AEROSPHERE) 160-9-4.8 MCG/ACT AERO  Inhale 2 puffs into the lungs in the morning and at bedtime.   clonazePAM  (KLONOPIN ) 1 MG tablet TAKE 1 TABLET BY MOUTH TWICE  DAILY AS NEEDED.   dexlansoprazole  (DEXILANT ) 60 MG capsule Take 1 capsule (60 mg total) by mouth daily.   hydroxychloroquine  (PLAQUENIL ) 200 MG tablet TAKE 2 TABLETS BY MOUTH DAILY   Misc. Devices (PULSE OXIMETER DELUXE) MISC Use daily to monitor oxygen  levels   montelukast  (SINGULAIR ) 10 MG tablet Take 1 tablet (10 mg total) by mouth at bedtime. (Patient not taking: Reported on 07/25/2023)   Nebulizers (COMPRESSOR/NEBULIZER) MISC Supplies only   Nebulizers (COMPRESSOR/NEBULIZER) MISC Use as directed   nitroGLYCERIN  (NITROSTAT ) 0.4 MG SL tablet Place 1 tablet (0.4 mg total) under the tongue every 5 (five) minutes as needed for chest pain.   OXYGEN  Inhale 2 L into the lungs as needed (at while sleeping).   oxymetazoline  (AFRIN NASAL SPRAY) 0.05 % nasal spray 2 sprays per nare for acute nasal bleeding/epistaxis (Patient not taking: Reported on 07/25/2023)   prednisoLONE  sodium phosphate  (INFLAMASE FORTE ) 1 % ophthalmic solution Place 1 drop into the left eye as needed. (Patient not taking: Reported on 07/25/2023)   predniSONE  (STERAPRED UNI-PAK 21 TAB) 10 MG (21) TBPK tablet Take 6 pills on day one then decrease by 1 pill each day   Respiratory Therapy Supplies (FLUTTER) DEVI 1 Device by Does not apply route daily.   Spacer/Aero-Holding Chambers (AEROCHAMBER MV) inhaler Use as  instructed   Spacer/Aero-Holding Chambers (AEROCHAMBER MV) inhaler Use as instructed   No facility-administered medications prior to visit.        Objective    BP 106/67 (BP Location: Left Arm, Patient Position: Sitting, Cuff Size: Normal)   Pulse 69   Temp 97.6 F (36.4 C) (Oral)   Resp 16   Ht 5\' 3"  (1.6 m)   Wt 131 lb 9.6 oz (59.7 kg)   SpO2 94%   BMI 23.31 kg/m    Physical Exam   General Appearance:    Well developed, well nourished female, alert, cooperative, in no acute distress   HENT:   bilateral TM normal without fluid or infection, neck without nodes, throat normal without erythema or exudate, frontal and maxillary sinuses tender, and nasal mucosa congested  Eyes:    PERRL, conjunctiva/corneas clear, EOM's intact       Lungs:     Clear to auscultation bilaterally, respirations unlabored  Heart:    Normal heart rate. Normal rhythm. No murmurs, rubs, or gallops.    Neurologic:   Awake, alert, oriented x 3. No apparent focal neurological           defect.        Assessment & Plan     1. Acute non-recurrent frontal sinusitis (Primary) Switch to Augmentin  as prescribed by Dr. Viva Grise  - fluticasone  (FLONASE ) 50 MCG/ACT nasal spray; Place 2 sprays into both nostrils as needed.  Dispense: 48 mL; Refill: 2  2. Essential (primary) hypertension Well controlled.  Continue current medications.    3. Iron deficiency Check  - Iron, TIBC and Ferritin Panel  4. Hyperlipidemia, unspecified hyperlipidemia type Tolerating increased dose of atorvastatin  well.  - Comprehensive metabolic panel with GFR - Lipid panel        Jeralene Mom, MD  [facility] [phone] (phone) [phone] (fax)  [facility]

## 2023-07-25 NOTE — Patient Instructions (Addendum)
 VISIT SUMMARY:  Today, you were seen for your COPD and recent upper respiratory infection with symptoms of tenacious sputum. You also reported significant head pain, facial pain, and ear congestion, which were diagnosed as acute maxillary sinusitis.  YOUR PLAN:  -CHRONIC OBSTRUCTIVE PULMONARY DISEASE (COPD): COPD is a chronic lung condition that makes it hard to breathe. Your COPD is currently managed well, and your wheezing has improved since your recent hospital visit. Please continue with your current COPD management regimen.  -ACUTE MAXILLARY SINUSITIS: Acute maxillary sinusitis is an infection of the sinuses that causes symptoms like yellow sputum, headache, facial pain, and ear congestion. Since the Z-Pak is not effective for this, a stronger antibiotic has been prescribed. Continue taking Mucinex  D and stay well hydrated to help manage your symptoms.  INSTRUCTIONS:  Please follow up in 4 to 6 weeks or as needed if your symptoms do not improve or if they worsen. Continue taking your medications as prescribed and maintain good hydration.

## 2023-07-25 NOTE — Progress Notes (Addendum)
 `  Subjective:    Patient ID: Andrea [name], female    DOB: 09/14/1965, 58 y.o.   MRN: [id number]  Patient Care Team: Lamon Pillow, MD as PCP - General (Family Medicine) Marc Senior, MD as Consulting Physician (Pulmonary Disease)  Chief Complaint  Patient presents with   Follow-up    Cough, chest and head congestion. Wheezing and shortness of breath on exertion. Is on prednisone  and azithromycin .    BACKGROUND/INTERVAL:Andrea [name] is a 58 year old current smoker (half PPD, 20 PY) with stage II COPD who presents for follow-up on the same. This is a scheduled visit.  He was last seen 25 April 2023.  Had a recent exacerbation for which she was started on prednisone  taper and azithromycin  on 22 Jul 2023.  This is a scheduled visit.    HPI Discussed the use of AI scribe software for clinical note transcription with the patient, who gave verbal consent to proceed.  History of Present Illness   Andrea [name] is a 58 year old female with COPD stage two who presents with a recent upper respiratory infection and symptoms of tenacious sputum.  She attributes her current symptoms to being around her sick grandchildren, despite not having close contact. She describes persistent drainage in her throat, feeling like there is a 'log' she cannot clear.  She is taking Mucinex  and Flonase  to manage her symptoms. Her sputum was yellow during a coughing spell yesterday, although this does not occur every time she coughs. She is still on a Z-Pak but questions its effectiveness due to frequent past use.  She experiences significant head pain, particularly when coughing, which she identifies as pressure. There is soreness in her head, especially in the maxillary sinus area, and she feels like her ears are congested, describing it as feeling like she is 'in a box'.  Her wheezing is not as severe as it was during a recent hospital visit. She is taking Mucinex  D, maximum strength, and ensures she stays  well hydrated by drinking plenty of water.  She has had no fevers, chills or sweats.  No known allergies to penicillin per patient. Has had Augmentin  previously without issue.      DATA 06/18/2016 PFTs: FEV1 1.45 L or 60% predicted VC 2.38 L or 90% predicted FEV1/FVC 61%.  Lung volumes: TLC 102%, RV 140%. DLCO 57%.  No significant bronchodilator response. 09/25/2016 2D echo: LVEF 60 to 65%, normal study. 06/08/2019 alpha 1 antitrypsin: Phenotype MM, level 150 mg/dL (normal). 09/01/2020 overnight oximetry: No significant desaturations. 01/15/2021 PFTs: FEV1 1.46 L or 57% predicted, FVC 2.31 L or 70% predicted, FEV1/FVC 63%, no bronchodilator response. TLC 113, RV 200% diffusion capacity moderately reduced. 06/11/2021 echocardiogram: LVEF 60 to 65%, no regional wall motion abnormality, diastolic send normal, right heart function normal.  No valvular abnormalities. 07/22/2023 CXR PA and lateral: Hyperinflation otherwise no other abnormalities.  Review of Systems A 10 point review of systems was performed and it is as noted above otherwise negative.   Patient Active Problem List   Diagnosis Date Noted   Hiatal hernia 03/28/2022   S/P repair of paraesophageal hernia 03/28/2022   Chronic anterior uveitis of left eye 10/04/2021   History of colonic polyps    Varicose veins of bilateral lower extremities with pain 11/22/2016   Restless leg 11/18/2016   Iron deficiency 11/18/2016   Allergic rhinitis 06/11/2016   Hyperlipidemia 03/19/2015   COPD (chronic obstructive pulmonary disease) (HCC) 10/04/2014   Depression 09/22/2014   Dyshidrosis 09/22/2014  GERD (gastroesophageal reflux disease) 09/22/2014   Insomnia 09/22/2014   Lung nodule, multiple 09/22/2014   Panic disorder 09/22/2014   Palpitations 01/17/2014   Pulmonary nodule 06/10/2013   Shortness of breath 05/20/2013   COPD, GOLD B 05/20/2013   Tobacco abuse 05/20/2013   Daytime somnolence 05/20/2013   Back pain 08/28/2012    Discoid lupus 05/30/2012   Pain in joint 03/09/2012   Psoriasis 02/21/2012   Fibromyalgia 02/11/2012   Lupus 02/11/2012   Panic attacks 02/11/2012   History of adenomatous polyp of colon 09/13/2011   Heartburn 08/06/2011   PVC's (premature ventricular contractions) 01/29/2011   Essential (primary) hypertension 03/04/1998    Social History   Tobacco Use   Smoking status: Every Day    Current packs/day: 1.00    Average packs/day: 1 pack/day for 20.0 years (20.0 ttl pk-yrs)    Types: Cigarettes    Passive exposure: Past   Smokeless tobacco: Never   Tobacco comments:    0.25 PPD -04/25/2023 khj   Substance Use Topics   Alcohol use: No    Alcohol/week: 0.0 standard drinks of alcohol    Allergies  Allergen Reactions   Alum & Mag Hydroxide-Simeth Diarrhea and Nausea Only   Bactrim  [Sulfamethoxazole -Trimethoprim ] Itching   Cefdinir Other (See Comments)    tachycardia   Chantix [Varenicline]     Bad dreams   Chlorhexidine  Gluconate Swelling   Doxycycline  Other (See Comments)    Nausea, migraine   Fentanyl  Nausea And Vomiting   Multaq  [Dronedarone ] Other (See Comments)    Lip/ mouth numbness/ tongue swelling   Ivp Dye [Iodinated Contrast Media] Palpitations    Current Meds  Medication Sig   albuterol  (PROVENTIL ) (2.5 MG/3ML) 0.083% nebulizer solution USE 1 VIAL IN NEBULIZER EVERY 4 (FOUR) HOURS AS NEEDED FOR WHEEZING OR SHORTNESS OF BREATH.   albuterol  (VENTOLIN  HFA) 108 (90 Base) MCG/ACT inhaler Inhale 2 puffs into the lungs every 6 (six) hours as needed for wheezing or shortness of breath.   amoxicillin -clavulanate (AUGMENTIN ) 875-125 MG tablet Take 1 tablet by mouth 2 (two) times daily for 7 days.   atorvastatin  (LIPITOR) 40 MG tablet TAKE 1 TABLET BY MOUTH EVERY DAY   azithromycin  (ZITHROMAX  Z-PAK) 250 MG tablet 2 pills today then 1 pill a day for 4 days   benzonatate  (TESSALON  PERLES) 100 MG capsule Take 1 capsule (100 mg total) by mouth 3 (three) times daily as needed.    Budeson-Glycopyrrol-Formoterol  (BREZTRI  AEROSPHERE) 160-9-4.8 MCG/ACT AERO Inhale 2 puffs into the lungs in the morning and at bedtime.   clonazePAM  (KLONOPIN ) 1 MG tablet TAKE 1 TABLET BY MOUTH TWICE  DAILY AS NEEDED.   dexlansoprazole  (DEXILANT ) 60 MG capsule Take 1 capsule (60 mg total) by mouth daily.   fluticasone  (FLONASE ) 50 MCG/ACT nasal spray Place 2 sprays into both nostrils as needed.   hydroxychloroquine  (PLAQUENIL ) 200 MG tablet TAKE 2 TABLETS BY MOUTH DAILY   Misc. Devices (PULSE OXIMETER DELUXE) MISC Use daily to monitor oxygen  levels   Nebulizers (COMPRESSOR/NEBULIZER) MISC Supplies only   Nebulizers (COMPRESSOR/NEBULIZER) MISC Use as directed   nitroGLYCERIN  (NITROSTAT ) 0.4 MG SL tablet Place 1 tablet (0.4 mg total) under the tongue every 5 (five) minutes as needed for chest pain.   OXYGEN  Inhale 2 L into the lungs as needed (at while sleeping).   predniSONE  (STERAPRED UNI-PAK 21 TAB) 10 MG (21) TBPK tablet Take 6 pills on day one then decrease by 1 pill each day   Respiratory Therapy Supplies (FLUTTER) DEVI 1 Device  by Does not apply route daily.   Spacer/Aero-Holding Chambers (AEROCHAMBER MV) inhaler Use as instructed   Spacer/Aero-Holding Chambers (AEROCHAMBER MV) inhaler Use as instructed    Immunization History  Administered Date(s) Administered   Moderna Sars-Covid-2 Vaccination 11/28/2019, 12/26/2019   Pneumococcal Conjugate-13 11/20/2012   Pneumococcal Polysaccharide-23 08/10/2012        Objective:     BP 120/80 (BP Location: Left Arm, Patient Position: Sitting, Cuff Size: Normal)   Pulse 73   Temp 97.7 F (36.5 C) (Temporal)   Ht 5\' 3"  (1.6 m)   Wt 131 lb (59.4 kg)   SpO2 96%   BMI 23.21 kg/m   SpO2: 96 %  GENERAL: Awake, alert, fully ambulatory.  No respiratory distress.  No conversational dyspnea.   HEAD: Normocephalic, atraumatic.  Significant maxillary sinus tenderness on palpation. EARS: Mild serous otitis bilaterally.  No purulent  effusion. EYES: Pupils equal, round, reactive to light.  No scleral icterus.  MOUTH: Dentures upper and lower. NECK: Supple. No thyromegaly. Trachea midline. No JVD.  No adenopathy. PULMONARY: Good air entry bilaterally.  There are coarse breath sounds throughout, no wheezes. CARDIOVASCULAR: S1 and S2. Regular rate and rhythm.  No rubs, murmurs or gallops heard. ABDOMEN: Benign. MUSCULOSKELETAL: No joint deformity, no clubbing, no edema.  NEUROLOGIC: No overt focal deficit.  Speech is fluent.  No gait disturbance. SKIN: Intact,warm,dry. PSYCH: Normal mood and behavior.  Chest x-ray obtained 22 Jul 2023 at ED, shows hyperinflation/COPD and no other acute abnormality:    Assessment & Plan:     ICD-10-CM   1. Acute recurrent maxillary sinusitis  J01.01     2. Stage 2 moderate COPD by GOLD classification (HCC)  J44.9     3. Nocturnal hypoxemia due to obstructive chronic bronchitis (HCC)  J44.89    G47.36     4. Tobacco dependence due to cigarettes  F17.210      Meds ordered this encounter  Medications   amoxicillin -clavulanate (AUGMENTIN ) 875-125 MG tablet    Sig: Take 1 tablet by mouth 2 (two) times daily for 7 days.    Dispense:  14 tablet    Refill:  0   Discussion:    Chronic Obstructive Pulmonary Disease, unspecified COPD stage two with recent upper respiratory infection. Symptoms include tenacious sputum and intermittent wheezing. Lungs are clear on examination. Wheezing has improved since hospital ED visit. - Continue current COPD management regimen (Breztri /albuterol ).  Acute maxillary sinusitis Acute maxillary sinusitis with symptoms of yellow sputum, headache, facial pain, and ear congestion. Z-Pak is ineffective for sinusitis. Examination reveals fluid behind ears without infection, likely due to sinus congestion. No penicillin allergy  reported, allowing for alternative antibiotic choice. - Prescribe Augmentin  875 twice daily to cover sinusitis. - Patient does not  endorse allergy  to penicillin and has had Augmentin  previously. - Continue Mucinex  DM. - Advise to maintain hydration.      Follow-up in 4 to 6 weeks time.  She is to contact us  prior to that time should any new difficulties arise. Will discuss lung cancer screening program on follow-up appointment.  Advised if symptoms do not improve or worsen, to please contact office for sooner follow up or seek emergency care.    I spent 40 minutes of dedicated to the care of this patient on the date of this encounter to include pre-visit review of records, face-to-face time with the patient discussing conditions above, post visit ordering of testing, clinical documentation with the electronic health record, making appropriate referrals as documented, and  communicating necessary findings to members of the patients care team.     C. Chloe Counter, MD Advanced Bronchoscopy PCCM [facility]    *This note was generated using voice recognition software/Dragon and/or AI transcription program.  Despite best efforts to proofread, errors can occur which can change the meaning. Any transcriptional errors that result from this process are unintentional and may not be fully corrected at the time of dictation.

## 2023-07-26 LAB — COMPREHENSIVE METABOLIC PANEL WITH GFR
ALT: 12 IU/L (ref 0–32)
AST: 10 IU/L (ref 0–40)
Albumin: 4.1 g/dL (ref 3.8–4.9)
Alkaline Phosphatase: 87 IU/L (ref 44–121)
BUN/Creatinine Ratio: 22 (ref 9–23)
BUN: 17 mg/dL (ref 6–24)
Bilirubin Total: 0.3 mg/dL (ref 0.0–1.2)
CO2: 24 mmol/L (ref 20–29)
Calcium: 9.5 mg/dL (ref 8.7–10.2)
Chloride: 101 mmol/L (ref 96–106)
Creatinine, Ser: 0.79 mg/dL (ref 0.57–1.00)
Globulin, Total: 3 g/dL (ref 1.5–4.5)
Glucose: 92 mg/dL (ref 70–99)
Potassium: 4.5 mmol/L (ref 3.5–5.2)
Sodium: 141 mmol/L (ref 134–144)
Total Protein: 7.1 g/dL (ref 6.0–8.5)
eGFR: 86 mL/min/{1.73_m2} (ref >59)

## 2023-07-26 LAB — LIPID PANEL
Chol/HDL Ratio: 4.6 ratio — ABNORMAL HIGH (ref 0.0–4.4)
Cholesterol, Total: 164 mg/dL (ref 100–199)
HDL: 36 mg/dL — ABNORMAL LOW (ref >39)
LDL Chol Calc (NIH): 109 mg/dL — ABNORMAL HIGH (ref 0–99)
Triglycerides: 105 mg/dL (ref 0–149)
VLDL Cholesterol Cal: 19 mg/dL (ref 5–40)

## 2023-07-26 LAB — IRON,TIBC AND FERRITIN PANEL
Ferritin: 137 ng/mL (ref 15–150)
Iron Saturation: 9 % — CL (ref 15–55)
Iron: 35 ug/dL (ref 27–159)
Total Iron Binding Capacity: 373 ug/dL (ref 250–450)
UIBC: 338 ug/dL (ref 131–425)

## 2023-07-29 DIAGNOSIS — J449 Chronic obstructive pulmonary disease, unspecified: Secondary | ICD-10-CM | POA: Diagnosis not present

## 2023-07-30 ENCOUNTER — Telehealth: Payer: Self-pay

## 2023-07-30 ENCOUNTER — Ambulatory Visit: Payer: Self-pay | Admitting: Family Medicine

## 2023-07-30 DIAGNOSIS — J449 Chronic obstructive pulmonary disease, unspecified: Secondary | ICD-10-CM | POA: Diagnosis not present

## 2023-07-30 NOTE — Telephone Encounter (Signed)
 Copied from CRM [id number]. Topic: Clinical - Lab/Test Results >> Jul 30, 2023  1:43 PM Felizardo Hotter wrote: Reason for CRM: Pt has questions regarding labs from 07/25/2023 and would like to go over them. Please call pt at [phone]

## 2023-07-30 NOTE — Telephone Encounter (Signed)
 See lab result note.

## 2023-08-04 ENCOUNTER — Ambulatory Visit: Admitting: Family Medicine

## 2023-08-09 ENCOUNTER — Telehealth: Admitting: Physician Assistant

## 2023-08-09 DIAGNOSIS — J329 Chronic sinusitis, unspecified: Secondary | ICD-10-CM

## 2023-08-09 NOTE — Progress Notes (Signed)
  Because you have been treated for a sinus infection within the past couple of weeks with persistent/ongoing symptoms, I feel your condition warrants further evaluation and I recommend that you be seen in a face-to-face visit.   NOTE: There will be NO CHARGE for this E-Visit   If you are having a true medical emergency, please call 911.     For an urgent face to face visit, [place] has multiple urgent care centers for your convenience.  Click the link below for the full list of locations and hours, walk-in wait times, appointment scheduling options and driving directions:  Urgent Care - [place], [place], [place], [place], [place], [place]  [place]     Your MyChart E-visit questionnaire answers were reviewed by a board certified advanced clinical practitioner to complete your personal care plan based on your specific symptoms.    Thank you for using e-Visits.

## 2023-08-11 ENCOUNTER — Ambulatory Visit: Payer: Self-pay | Admitting: *Deleted

## 2023-08-11 ENCOUNTER — Other Ambulatory Visit: Payer: Self-pay | Admitting: Family Medicine

## 2023-08-11 DIAGNOSIS — J329 Chronic sinusitis, unspecified: Secondary | ICD-10-CM

## 2023-08-11 DIAGNOSIS — J441 Chronic obstructive pulmonary disease with (acute) exacerbation: Secondary | ICD-10-CM

## 2023-08-11 MED ORDER — LEVOFLOXACIN 750 MG PO TABS: 750.0000 mg | ORAL_TABLET | Freq: Every day | ORAL | 0 refills | Status: AC

## 2023-08-11 NOTE — Addendum Note (Signed)
 Addended by: Lamon Pillow on: 08/11/2023 04:28 PM   Modules accepted: Orders

## 2023-08-11 NOTE — Telephone Encounter (Signed)
 Have sent prescription for levaquin 

## 2023-08-11 NOTE — Telephone Encounter (Signed)
 Pt has been seen for this several times over the last 5 weeks.   Wanting to know if Dr. Shann Darnel can call in something else for coughing and sinus congestion.    FYI Only or Action Required?: Action required by provider  Patient was last seen in primary care on 07/25/2023 by Lamon Pillow, MD. Called Nurse Triage reporting Cough. Symptoms began about a month ago. Interventions attempted: Prescription medications: Flonase  and antibiotic and Mucinex . Symptoms are: unchanged.  Triage Disposition: See Physician Within 24 Hours  Patient/caregiver understands and will follow disposition?:  Yes  Asking if Dr. Shann Darnel can call in something else for the coughing and head congestion.    Not getting anything up now.          Copied from CRM [id number]. Topic: Clinical - Red Word Triage >> Aug 11, 2023  8:49 AM Juluis Ok wrote: [facility] that prompted transfer to Nurse Triage: cough w/ discolored mucous, head congestion Reason for Disposition  [1] Continuous (nonstop) coughing interferes with work or school AND [2] no improvement using cough treatment per Care Advice    Been seen for this several times.  Answer Assessment - Initial Assessment Questions 1. NSET: "When did the cough begin?"      I've had this for 5 weeks in my head.   It's time for it's to go.    I'm coughing up yellow mucus until this morning and nothing is coming up.   I'm using nasal spray.   I'm using Mucinex .  I got better on the antibiotic but once I finished it the symptoms came back.   I'm so sick of this coughing.  Does he want to call in something else?      I'm using Flonase .    When the antibiotic ran out I got sick again.    I'm using the Tessalon  Perles.   Which help 2. SEVERITY: "How bad is the cough today?"      I've had this cough for 5 weeks. 3. SPUTUM: "Describe the color of your sputum" (none, dry cough; clear, white, yellow, green)     Yellow when I can get something up.    4. HEMOPTYSIS: "Are you coughing up any  blood?" If so ask: "How much?" (flecks, streaks, tablespoons, etc.)     Not asked 5. DIFFICULTY BREATHING: "Are you having difficulty breathing?" If Yes, ask: "How bad is it?" (e.g., mild, moderate, severe)    - MILD: No SOB at rest, mild SOB with walking, speaks normally in sentences, can lie down, no retractions, pulse < 100.    - MODERATE: SOB at rest, SOB with minimal exertion and prefers to sit, cannot lie down flat, speaks in phrases, mild retractions, audible wheezing, pulse 100-120.    - SEVERE: Very SOB at rest, speaks in single words, struggling to breathe, sitting hunched forward, retractions, pulse > 120   No shortness of breath or wheezing.   Just the head congestion and coughing.              6. FEVER: "Do you have a fever?" If Yes, ask: "What is your temperature, how was it measured, and when did it start?"     No 7. CARDIAC HISTORY: "Do you have any history of heart disease?" (e.g., heart attack, congestive heart failure)      Not asked 8. LUNG HISTORY: "Do you have any history of lung disease?"  (e.g., pulmonary embolus, asthma, emphysema)     Not asked 9. PE  RISK FACTORS: "Do you have a history of blood clots?" (or: recent major surgery, recent prolonged travel, bedridden)      I have Lupus.    The coughing causes uveitis in my eye to flare up.  I'm on prednisone  for that with the Lupus.  10. OTHER SYMPTOMS: "Do you have any other symptoms?" (e.g., runny nose, wheezing, chest pain)       Sinus congestion and coughing a lot.   11. PREGNANCY: "Is there any chance you are pregnant?" "When was your last menstrual period?"       Not asked due to age 58. TRAVEL: "Have you traveled out of the country in the last month?" (e.g., travel history, exposures)       Not asked  Protocols used: Cough - Acute Productive-A-AH

## 2023-08-12 NOTE — Telephone Encounter (Signed)
 Patient advised.

## 2023-08-18 DIAGNOSIS — J449 Chronic obstructive pulmonary disease, unspecified: Secondary | ICD-10-CM | POA: Diagnosis not present

## 2023-08-20 ENCOUNTER — Other Ambulatory Visit: Payer: Self-pay | Admitting: Family Medicine

## 2023-08-20 DIAGNOSIS — E785 Hyperlipidemia, unspecified: Secondary | ICD-10-CM

## 2023-08-21 ENCOUNTER — Other Ambulatory Visit: Payer: Self-pay | Admitting: Gastroenterology

## 2023-08-23 DIAGNOSIS — J9621 Acute and chronic respiratory failure with hypoxia: Secondary | ICD-10-CM | POA: Diagnosis not present

## 2023-08-30 DIAGNOSIS — J449 Chronic obstructive pulmonary disease, unspecified: Secondary | ICD-10-CM | POA: Diagnosis not present

## 2023-09-02 ENCOUNTER — Telehealth: Payer: Self-pay

## 2023-09-02 ENCOUNTER — Ambulatory Visit: Admitting: Pulmonary Disease

## 2023-09-02 ENCOUNTER — Other Ambulatory Visit: Payer: Self-pay

## 2023-09-02 DIAGNOSIS — R0602 Shortness of breath: Secondary | ICD-10-CM | POA: Diagnosis not present

## 2023-09-02 DIAGNOSIS — J449 Chronic obstructive pulmonary disease, unspecified: Secondary | ICD-10-CM

## 2023-09-02 LAB — PULMONARY FUNCTION TEST
DL/VA % pred: 71 %
DL/VA: 3.02 ml/min/mmHg/L
DLCO unc % pred: 67 %
DLCO unc: 13.12 ml/min/mmHg
FEF 25-75 Post: 0.9 L/s
FEF 25-75 Pre: 0.72 L/s
FEF2575-%Change-Post: 24 %
FEF2575-%Pred-Post: 38 %
FEF2575-%Pred-Pre: 30 %
FEV1-%Change-Post: 9 %
FEV1-%Pred-Post: 61 %
FEV1-%Pred-Pre: 56 %
FEV1-Post: 1.53 L
FEV1-Pre: 1.39 L
FEV1FVC-%Change-Post: 3 %
FEV1FVC-%Pred-Pre: 77 %
FEV6-%Change-Post: 5 %
FEV6-%Pred-Post: 76 %
FEV6-%Pred-Pre: 72 %
FEV6-Post: 2.38 L
FEV6-Pre: 2.25 L
FEV6FVC-%Change-Post: 0 %
FEV6FVC-%Pred-Post: 101 %
FEV6FVC-%Pred-Pre: 101 %
FVC-%Change-Post: 6 %
FVC-%Pred-Post: 75 %
FVC-%Pred-Pre: 71 %
FVC-Post: 2.44 L
FVC-Pre: 2.29 L
Post FEV1/FVC ratio: 63 %
Post FEV6/FVC ratio: 98 %
Pre FEV1/FVC ratio: 61 %
Pre FEV6/FVC Ratio: 98 %
RV % pred: 158 %
RV: 3.04 L
TLC % pred: 112 %
TLC: 5.53 L

## 2023-09-02 MED ORDER — BREZTRI AEROSPHERE 160-9-4.8 MCG/ACT IN AERO: 2.0000 | INHALATION_SPRAY | Freq: Two times a day (BID) | RESPIRATORY_TRACT | Status: DC

## 2023-09-02 NOTE — Patient Instructions (Signed)
 Full PFT completed today ? ?

## 2023-09-02 NOTE — Progress Notes (Signed)
 Full PFT completed today ? ?

## 2023-09-02 NOTE — Telephone Encounter (Signed)
 Patient requested Breztri  samples today.  Dr. Tamea approved 2 samples. NFN.

## 2023-09-10 ENCOUNTER — Ambulatory Visit (INDEPENDENT_AMBULATORY_CARE_PROVIDER_SITE_OTHER): Admitting: Pulmonary Disease

## 2023-09-10 ENCOUNTER — Encounter: Payer: Self-pay | Admitting: Pulmonary Disease

## 2023-09-10 VITALS — BP 124/80 | HR 74 | Temp 97.8°F | Ht 63.0 in | Wt 131.0 lb

## 2023-09-10 DIAGNOSIS — F17210 Nicotine dependence, cigarettes, uncomplicated: Secondary | ICD-10-CM

## 2023-09-10 DIAGNOSIS — G4736 Sleep related hypoventilation in conditions classified elsewhere: Secondary | ICD-10-CM | POA: Diagnosis not present

## 2023-09-10 DIAGNOSIS — J4489 Other specified chronic obstructive pulmonary disease: Secondary | ICD-10-CM

## 2023-09-10 DIAGNOSIS — J449 Chronic obstructive pulmonary disease, unspecified: Secondary | ICD-10-CM

## 2023-09-10 DIAGNOSIS — K219 Gastro-esophageal reflux disease without esophagitis: Secondary | ICD-10-CM | POA: Diagnosis not present

## 2023-09-10 DIAGNOSIS — R0602 Shortness of breath: Secondary | ICD-10-CM

## 2023-09-10 MED ORDER — AEROCHAMBER MV MISC: 0 refills | Status: AC

## 2023-09-10 MED ORDER — AEROCHAMBER MV MISC: 0 refills | Status: DC

## 2023-09-10 NOTE — Progress Notes (Signed)
 Subjective:    Patient ID: Andrea [name], female    DOB: 1965-04-29, 58 y.o.   MRN: [id number]  Patient Care Team: [name], MD as PCP - General (Family Medicine) Tamea Dedra LITTIE, MD as Consulting Physician (Pulmonary Disease)  Chief Complaint  Patient presents with   Follow-up    Shortness of breath on exertion.     BACKGROUND/INTERVAL:Lyllian is a 58 year old current smoker (half PPD, 20 PY) with stage 2 COPD and a history as noted below, who presents for follow-up on the same. This is a scheduled visit.  He was last seen 25 Jul 2023.   HPI Discussed the use of AI scribe software for clinical note transcription with the patient, who gave verbal consent to proceed.  History of Present Illness   Andrea [name] is a 58 year old female with COPD who presents for a follow-up on her condition.  She continues to smoke half a pack of cigarettes per day, influenced by others around her who smoke. She experiences increased dyspnea on hot and humid days, necessitating the use of albuterol  in addition to her regular Breztri  medication. She takes Breztri  twice daily and finds it effective, but requires additional relief during high humidity and allergen days.  Her shortness of breath is manageable indoors but exacerbates with heat exposure. She also experiences dyspnea postprandially, which she attributes to acid reflux. She is currently taking Dexilant  for acid reflux but continues to experience symptoms, particularly after meals.  Recall she had paraesophageal hernia repair on January 2024.  Unfortunately her reflux symptoms have recurred after repair.  She has been fasting until the afternoon to avoid reflux symptoms, contributing to weight loss, now weighing 131 pounds. She is dissatisfied with a previous surgical intervention for her hernia, which she feels was not successful in resolving her symptoms.  No change in lung function since 2022. Increased shortness of breath in hot  weather and after eating. Uses albuterol  as needed for breathing difficulties.   We discussed the results of her PFTs from 02 September 2023.     DATA 06/18/2016 PFTs: FEV1 1.45 L or 60% predicted VC 2.38 L or 90% predicted FEV1/FVC 61%.  Lung volumes: TLC 102%, RV 140%. DLCO 57%.  No significant bronchodilator response. 09/25/2016 2D echo: LVEF 60 to 65%, normal study. 06/08/2019 alpha 1 antitrypsin: Phenotype MM, level 150 mg/dL (normal). 09/01/2020 overnight oximetry: No significant desaturations. 01/15/2021 PFTs: FEV1 1.46 L or 57% predicted, FVC 2.31 L or 70% predicted, FEV1/FVC 63%, no bronchodilator response. TLC 113, RV 200% diffusion capacity moderately reduced. 06/11/2021 echocardiogram: LVEF 60 to 65%, no regional wall motion abnormality, diastolic send normal, right heart function normal.  No valvular abnormalities. 07/22/2023 CXR PA and lateral: Hyperinflation otherwise no other abnormalities. 09/02/2023 PFTs: FEV1 1.39 L or 56% predicted, FVC 2.29 L or 71% predicted, FEV1/FVC 61%, no significant bronchodilator response.  There is mild hyperinflation and moderate air trapping noted on lung volumes.  Diffusion capacity mildly reduced consistent with moderate to severe obstructive airways disease with mild diffusion defect.  No significant change from prior study performed 15 January 2021.   Review of Systems A 10 point review of systems was performed and it is as noted above otherwise negative.   Patient Active Problem List   Diagnosis Date Noted   Hiatal hernia 03/28/2022   S/P repair of paraesophageal hernia 03/28/2022   Chronic anterior uveitis of left eye 10/04/2021   History of colonic polyps    Varicose veins of  bilateral lower extremities with pain 11/22/2016   Restless leg 11/18/2016   Iron deficiency 11/18/2016   Allergic rhinitis 06/11/2016   Hyperlipidemia 03/19/2015   COPD (chronic obstructive pulmonary disease) (HCC) 10/04/2014   Depression 09/22/2014   Dyshidrosis  09/22/2014   GERD (gastroesophageal reflux disease) 09/22/2014   Insomnia 09/22/2014   Lung nodule, multiple 09/22/2014   Panic disorder 09/22/2014   Palpitations 01/17/2014   Pulmonary nodule 06/10/2013   Shortness of breath 05/20/2013   COPD, GOLD B 05/20/2013   Tobacco abuse 05/20/2013   Daytime somnolence 05/20/2013   Back pain 08/28/2012   Discoid lupus 05/30/2012   Pain in joint 03/09/2012   Psoriasis 02/21/2012   Fibromyalgia 02/11/2012   Lupus 02/11/2012   Panic attacks 02/11/2012   History of adenomatous polyp of colon 09/13/2011   Heartburn 08/06/2011   PVC's (premature ventricular contractions) 01/29/2011   Essential (primary) hypertension 03/04/1998    Social History   Tobacco Use   Smoking status: Every Day    Current packs/day: 1.00    Average packs/day: 1 pack/day for 20.0 years (20.0 ttl pk-yrs)    Types: Cigarettes    Passive exposure: Past   Smokeless tobacco: Never   Tobacco comments:    0.5 PPD -09/10/2023  Substance Use Topics   Alcohol use: No    Alcohol/week: 0.0 standard drinks of alcohol    Allergies  Allergen Reactions   Alum & Mag Hydroxide-Simeth Diarrhea and Nausea Only   Bactrim  [Sulfamethoxazole -Trimethoprim ] Itching   Cefdinir Other (See Comments)    tachycardia   Chantix [Varenicline]     Bad dreams   Chlorhexidine  Gluconate Swelling   Doxycycline  Other (See Comments)    Nausea, migraine   Fentanyl  Nausea And Vomiting   Multaq  [Dronedarone ] Other (See Comments)    Lip/ mouth numbness/ tongue swelling   Ivp Dye [Iodinated Contrast Media] Palpitations    Current Meds  Medication Sig   albuterol  (PROVENTIL ) (2.5 MG/3ML) 0.083% nebulizer solution USE 1 VIAL IN NEBULIZER EVERY 4 (FOUR) HOURS AS NEEDED FOR WHEEZING OR SHORTNESS OF BREATH.   albuterol  (VENTOLIN  HFA) 108 (90 Base) MCG/ACT inhaler Inhale 2 puffs into the lungs every 6 (six) hours as needed for wheezing or shortness of breath.   atorvastatin  (LIPITOR) 40 MG tablet  TAKE 1 TABLET BY MOUTH DAILY   budesonide -glycopyrrolate -formoterol  (BREZTRI  AEROSPHERE) 160-9-4.8 MCG/ACT AERO inhaler Inhale 2 puffs into the lungs in the morning and at bedtime.   clonazePAM  (KLONOPIN ) 1 MG tablet TAKE 1 TABLET BY MOUTH TWICE  DAILY AS NEEDED.   dexlansoprazole  (DEXILANT ) 60 MG capsule TAKE 1 CAPSULE BY MOUTH EVERY DAY   fluticasone  (FLONASE ) 50 MCG/ACT nasal spray Place 2 sprays into both nostrils as needed.   hydroxychloroquine  (PLAQUENIL ) 200 MG tablet TAKE 2 TABLETS BY MOUTH DAILY   Misc. Devices (PULSE OXIMETER DELUXE) MISC Use daily to monitor oxygen  levels   montelukast  (SINGULAIR ) 10 MG tablet Take 1 tablet (10 mg total) by mouth at bedtime. (Patient taking differently: Take 10 mg by mouth as needed.)   Nebulizers (COMPRESSOR/NEBULIZER) MISC Supplies only   Nebulizers (COMPRESSOR/NEBULIZER) MISC Use as directed   nitroGLYCERIN  (NITROSTAT ) 0.4 MG SL tablet Place 1 tablet (0.4 mg total) under the tongue every 5 (five) minutes as needed for chest pain.   OXYGEN  Inhale 2 L into the lungs as needed (at while sleeping).   prednisoLONE  sodium phosphate  (INFLAMASE FORTE ) 1 % ophthalmic solution Place 1 drop into the left eye as needed.   Respiratory Therapy Supplies (FLUTTER) DEVI  1 Device by Does not apply route daily.   [DISCONTINUED] Spacer/Aero-Holding Chambers (AEROCHAMBER MV) inhaler Use as instructed   [DISCONTINUED] Spacer/Aero-Holding Chambers (AEROCHAMBER MV) inhaler Use as instructed    Immunization History  Administered Date(s) Administered   Moderna Sars-Covid-2 Vaccination 11/28/2019, 12/26/2019   Pneumococcal Conjugate-13 11/20/2012   Pneumococcal Polysaccharide-23 08/10/2012        Objective:     BP 124/80 (BP Location: Left Arm, Patient Position: Sitting, Cuff Size: Normal)   Pulse 74   Temp 97.8 F (36.6 C) (Oral)   Ht 5' 3 (1.6 m)   Wt 131 lb (59.4 kg)   SpO2 98%   BMI 23.21 kg/m   SpO2: 98 %  GENERAL: Awake, alert, fully ambulatory.   No respiratory distress.  No conversational dyspnea.   HEAD: Normocephalic, atraumatic.  Significant maxillary sinus tenderness on palpation. EARS: Mild serous otitis bilaterally.  No purulent effusion. EYES: Pupils equal, round, reactive to light.  No scleral icterus.  MOUTH: Dentures upper and lower. NECK: Supple. No thyromegaly. Trachea midline. No JVD.  No adenopathy. PULMONARY: Good air entry bilaterally.  There are coarse breath sounds throughout, no wheezes. CARDIOVASCULAR: S1 and S2. Regular rate and rhythm.  No rubs, murmurs or gallops heard. ABDOMEN: Benign. MUSCULOSKELETAL: No joint deformity, no clubbing, no edema.  NEUROLOGIC: No overt focal deficit.  Speech is fluent.  No gait disturbance. SKIN: Intact,warm,dry. PSYCH: Normal mood and behavior.   Assessment & Plan:     ICD-10-CM   1. Chronic GERD  K21.9 Ambulatory referral to Gastroenterology    2. Stage 2 moderate COPD by GOLD classification (HCC)  J44.9     3. SOB (shortness of breath)  R06.02     4. Nocturnal hypoxemia due to obstructive chronic bronchitis (HCC)  J44.89    G47.36     5. Tobacco dependence due to cigarettes  F17.210       Orders Placed This Encounter  Procedures   Ambulatory referral to Gastroenterology    Referral Priority:   Routine    Referral Type:   Consultation    Referral Reason:   Specialty Services Required    Requested Specialty:   Gastroenterology    Number of Visits Requested:   1    Meds ordered this encounter  Medications   Spacer/Aero-Holding Chambers (AEROCHAMBER MV) inhaler    Sig: Use as instructed    Dispense:  1 each    Refill:  0   Discussion:    Chronic Obstructive Pulmonary Disease (COPD) Well-managed COPD with no decline in lung function since 2022. Continues to smoke half a pack of cigarettes per day, exacerbating symptoms. Increased symptoms on hot, humid days. Uses albuterol  as needed for symptom relief between doses of Breztri . - Encourage smoking  cessation - Continue Breztri  as prescribed - Use albuterol  as needed for symptom relief - Advise staying indoors on high allergen and hot, humid days  Gastroesophageal Reflux Disease (GERD) Persistent GERD symptoms despite Dexilant . Acid reflux exacerbates shortness of breath, particularly postprandial. Previous surgical intervention was unsuccessful. Weight loss likely due to dietary changes to manage symptoms. Referral to Dr[facility] Therisa at [facility] for further evaluation and management due to potential risk of acid aspiration into lungs. - Refer to Dr[facility] Therisa at [facility] for further evaluation and management of GERD - Continue Dexilant  as prescribed     Advised if symptoms do not improve or worsen, to please contact office for sooner follow up or seek emergency care.    I  spent 33 minutes of dedicated to the care of this patient on the date of this encounter to include pre-visit review of records, face-to-face time with the patient discussing conditions above, post visit ordering of testing, clinical documentation with the electronic health record, making appropriate referrals as documented, and communicating necessary findings to members of the patients care team.     C. Leita Sanders, MD [facility]-    *This note was generated using voice recognition software/Dragon and/or AI transcription program.  Despite best efforts to proofread, errors can occur which can change the meaning. Any transcriptional errors that result from this process are unintentional and may not be fully corrected at the time of dictation.

## 2023-09-10 NOTE — Patient Instructions (Signed)
 VISIT SUMMARY:  Today, we discussed your ongoing management of COPD and GERD. Your COPD remains stable with no decline in lung function since 2022, but you continue to experience increased symptoms on hot and humid days. We also addressed your persistent GERD symptoms, which are affecting your breathing and contributing to weight loss.  YOUR PLAN:  -CHRONIC OBSTRUCTIVE PULMONARY DISEASE (COPD): COPD is a chronic lung condition that makes it hard to breathe. Your COPD is well-managed with no decline in lung function since 2022. However, smoking continues to exacerbate your symptoms. You should continue taking Restri twice daily and use albuterol  as needed for symptom relief. It's important to stay indoors on high allergen and hot, humid days to avoid worsening symptoms. Smoking cessation is strongly encouraged to improve your condition.  -GASTROESOPHAGEAL REFLUX DISEASE (GERD): GERD is a condition where stomach acid frequently flows back into the tube connecting your mouth and stomach, causing discomfort and potential damage. Despite taking Dexilant , you continue to experience symptoms, especially after meals, which also affects your breathing. You have been referred to Dr. Therisa at [facility] for further evaluation and management. Continue taking Dexilant  as prescribed.  INSTRUCTIONS:  Please follow up with Dr. Therisa at [facility] for further evaluation and management of your GERD. Continue taking your medications as prescribed and try to stay indoors on high allergen and hot, humid days. Smoking cessation is highly recommended to help manage your COPD.

## 2023-09-17 ENCOUNTER — Ambulatory Visit: Payer: 59

## 2023-09-17 DIAGNOSIS — Z0000 Encounter for general adult medical examination without abnormal findings: Secondary | ICD-10-CM

## 2023-09-17 DIAGNOSIS — Z122 Encounter for screening for malignant neoplasm of respiratory organs: Secondary | ICD-10-CM

## 2023-09-17 NOTE — Progress Notes (Signed)
 Subjective:   Andrea [name] is a 58 y.o. who presents for a Medicare Wellness preventive visit.  As a reminder, Annual Wellness Visits don't include a physical exam, and some assessments may be limited, especially if this visit is performed virtually. We may recommend an in-person follow-up visit with your provider if needed.  Visit Complete: Virtual I connected with  Soraya L Richison on 09/17/23 by a audio enabled telemedicine application and verified that I am speaking with the correct person using two identifiers.  Patient Location: Home  Provider Location: Home Office  I discussed the limitations of evaluation and management by telemedicine. The patient expressed understanding and agreed to proceed.  Vital Signs: Because this visit was a virtual/telehealth visit, some criteria may be missing or patient reported. Any vitals not documented were not able to be obtained and vitals that have been documented are patient reported.  VideoDeclined- This patient declined Librarian, academic. Therefore the visit was completed with audio only.  Persons Participating in Visit: Patient.  AWV Questionnaire: No: Patient Medicare AWV questionnaire was not completed prior to this visit.  Cardiac Risk Factors include: sedentary lifestyle;hypertension;dyslipidemia;smoking/ tobacco exposure     Objective:    There were no vitals filed for this visit. There is no height or weight on file to calculate BMI.     09/17/2023    9:37 AM 07/22/2023    8:34 AM 04/01/2023    5:45 PM 02/27/2023    9:03 AM 11/27/2022   10:29 AM 08/21/2022    8:52 AM 03/28/2022   10:03 AM  Advanced Directives  Does Patient Have a Medical Advance Directive? No No No No No Yes Yes  Type of Careers adviser;Living will Living will;Healthcare Power of Attorney  Does patient want to make changes to medical advance directive?       No - Patient declined  Copy of  Healthcare Power of Attorney in Chart?       No - copy requested  Would patient like information on creating a medical advance directive? No - Patient declined No - Patient declined  No - Patient declined No - Patient declined  No - Patient declined    Current Medications (verified) Outpatient Encounter Medications as of 09/17/2023  Medication Sig   albuterol  (PROVENTIL ) (2.5 MG/3ML) 0.083% nebulizer solution USE 1 VIAL IN NEBULIZER EVERY 4 (FOUR) HOURS AS NEEDED FOR WHEEZING OR SHORTNESS OF BREATH.   albuterol  (VENTOLIN  HFA) 108 (90 Base) MCG/ACT inhaler Inhale 2 puffs into the lungs every 6 (six) hours as needed for wheezing or shortness of breath.   atorvastatin  (LIPITOR) 40 MG tablet TAKE 1 TABLET BY MOUTH DAILY   budesonide -glycopyrrolate -formoterol  (BREZTRI  AEROSPHERE) 160-9-4.8 MCG/ACT AERO inhaler Inhale 2 puffs into the lungs in the morning and at bedtime.   clonazePAM  (KLONOPIN ) 1 MG tablet TAKE 1 TABLET BY MOUTH TWICE  DAILY AS NEEDED. (Patient taking differently: Take 1 mg by mouth 2 (two) times daily as needed. TAKES EVERY DAY)   dexlansoprazole  (DEXILANT ) 60 MG capsule TAKE 1 CAPSULE BY MOUTH EVERY DAY   fluticasone  (FLONASE ) 50 MCG/ACT nasal spray Place 2 sprays into both nostrils as needed.   hydroxychloroquine  (PLAQUENIL ) 200 MG tablet TAKE 2 TABLETS BY MOUTH DAILY   Misc. Devices (PULSE OXIMETER DELUXE) MISC Use daily to monitor oxygen  levels   montelukast  (SINGULAIR ) 10 MG tablet Take 1 tablet (10 mg total) by mouth at bedtime.   Nebulizers (COMPRESSOR/NEBULIZER) MISC  Supplies only   Nebulizers (COMPRESSOR/NEBULIZER) MISC Use as directed   nitroGLYCERIN  (NITROSTAT ) 0.4 MG SL tablet Place 1 tablet (0.4 mg total) under the tongue every 5 (five) minutes as needed for chest pain.   OXYGEN  Inhale 2 L into the lungs as needed (at while sleeping).   prednisoLONE  sodium phosphate  (INFLAMASE FORTE ) 1 % ophthalmic solution Place 1 drop into the left eye as needed.   Respiratory Therapy  Supplies (FLUTTER) DEVI 1 Device by Does not apply route daily.   Spacer/Aero-Holding Chambers (AEROCHAMBER MV) inhaler Use as instructed   No facility-administered encounter medications on file as of 09/17/2023.    Allergies (verified) Alum & mag hydroxide-simeth, Bactrim  [sulfamethoxazole -trimethoprim ], Cefdinir, Chantix [varenicline], Chlorhexidine  gluconate, Doxycycline , Fentanyl , Multaq  [dronedarone ], and Ivp dye [iodinated contrast media]   History: Past Medical History:  Diagnosis Date   Anxiety    panic attacks, chest pain   Arthritis    COPD (chronic obstructive pulmonary disease) (HCC)    COVID    2023   Dyspnea    GERD (gastroesophageal reflux disease)    History of hiatal hernia    Hypertension    not medicated   Non-obstructive CAD in native artery    a. cardiac cath 01/2014: showed minor irregularities, mildly elevated left ventricular end-diastolic pressure and normal ejection fraction; b. 03/2015 MV: low risk.   Palpitations    a. 08/2016 Event Monitor: occas PVC's, two brief runs of SVT (4 and 7 beats).   PONV (postoperative nausea and vomiting)    PONV (postoperative nausea and vomiting)    Stage 2 moderate COPD by GOLD classification (HCC)    Symptomatic PVC's (premature ventricular contractions)    Tobacco abuse    Wears dentures    partial upper and lower   Past Surgical History:  Procedure Laterality Date   CARDIAC CATHETERIZATION  01/03/14   CARDIAC CATHETERIZATION     CATARACT EXTRACTION W/PHACO Left 11/27/2022   Procedure: CATARACT EXTRACTION PHACO AND INTRAOCULAR LENS PLACEMENT (IOC) LEFT MALYUGIN  3.59  00:31.6;  Surgeon: Mittie Gaskin, MD;  Location: [facility];  Service: Ophthalmology;  Laterality: Left;   CHOLECYSTECTOMY     COLONOSCOPY WITH PROPOFOL  N/A 10/05/2019   Procedure: COLONOSCOPY WITH PROPOFOL ;  Surgeon: Jinny Carmine, MD;  Location: [facility];  Service: Endoscopy;  Laterality: N/A;   ESOPHAGOGASTRODUODENOSCOPY (EGD)  WITH PROPOFOL  N/A 07/11/2016   Procedure: ESOPHAGOGASTRODUODENOSCOPY (EGD) WITH PROPOFOL ;  Surgeon: Jinny Carmine, MD;  Location: [facility];  Service: Endoscopy;  Laterality: N/A;   ESOPHAGOGASTRODUODENOSCOPY (EGD) WITH PROPOFOL  N/A 10/05/2019   Procedure: ESOPHAGOGASTRODUODENOSCOPY (EGD) WITH PROPOFOL ;  Surgeon: Jinny Carmine, MD;  Location: [facility];  Service: Endoscopy;  Laterality: N/A;   ESOPHAGOGASTRODUODENOSCOPY (EGD) WITH PROPOFOL  N/A 02/21/2022   Procedure: ESOPHAGOGASTRODUODENOSCOPY (EGD) WITH PROPOFOL ;  Surgeon: Jinny Carmine, MD;  Location: [facility];  Service: Endoscopy;  Laterality: N/A;   EYE SURGERY     IMAGE GUIDED SINUS SURGERY N/A 11/30/2014   Procedure: IMAGE GUIDED SINUS SURGERY;  Surgeon: Carolee Hunter, MD;  Location: [facility];  Service: ENT;  Laterality: N/A;  GAVE DISK TO CE CE   INSERTION OF MESH  03/28/2022   Procedure: INSERTION OF MESH;  Surgeon: Jordis Laneta FALCON, MD;  Location: [facility];  Service: General;;   MAXILLARY ANTROSTOMY Bilateral 11/30/2014   Procedure: MAXILLARY ANTROSTOMY;  Surgeon: Carolee Hunter, MD;  Location: [facility];  Service: ENT;  Laterality: Bilateral;   SEPTOPLASTY N/A 11/30/2014   Procedure: SEPTOPLASTY;  Surgeon: Carolee Hunter, MD;  Location:  [facility];  Service: ENT;  Laterality: N/A;   SPHENOIDECTOMY Left 11/30/2014   Procedure: SPHENOIDECTOMY, ;  Surgeon: Carolee Hunter, MD;  Location: [facility];  Service: ENT;  Laterality: Left;   VAGINAL HYSTERECTOMY  1995   vaginal   XI ROBOTIC ASSISTED PARAESOPHAGEAL HERNIA REPAIR N/A 03/28/2022   Procedure: XI ROBOTIC ASSISTED PARAESOPHAGEAL HERNIA REPAIR, RNFA to assist;  Surgeon: Jordis Laneta FALCON, MD;  Location: [facility];  Service: General;  Laterality: N/A;   Family History  Problem Relation Age of Onset   Emphysema Father    Asthma Father    Heart disease Father    Heart attack Father    Arthritis Father        RA   Colon cancer  Father    Hypertension Mother    Breast cancer Neg Hx    Social History   Socioeconomic History   Marital status: Married    Spouse name: Not on file   Number of children: 3   Years of education: Not on file   Highest education level: GED or equivalent  Occupational History   Occupation: Disabled    Comment: Social Security as of 05/21/2011  Tobacco Use   Smoking status: Every Day    Current packs/day: 1.00    Average packs/day: 1 pack/day for 20.0 years (20.0 ttl pk-yrs)    Types: Cigarettes    Passive exposure: Past   Smokeless tobacco: Never   Tobacco comments:    0.5 PPD -09/10/2023  Vaping Use   Vaping status: Never Used  Substance and Sexual Activity   Alcohol use: No    Alcohol/week: 0.0 standard drinks of alcohol   Drug use: No   Sexual activity: Not Currently  Other Topics Concern   Not on file  Social History Narrative   Lives at home with husband. Independent at baseline.   Social Drivers of Health   Financial Resource Strain: Medium Risk (09/17/2023)   Overall Financial Resource Strain (CARDIA)    Difficulty of Paying Living Expenses: Somewhat hard  Food Insecurity: Patient Declined (09/17/2023)   Hunger Vital Sign    Worried About Running Out of Food in the Last Year: Patient declined    Ran Out of Food in the Last Year: Patient declined  Transportation Needs: No Transportation Needs (09/17/2023)   PRAPARE - Administrator, Civil Service (Medical): No    Lack of Transportation (Non-Medical): No  Physical Activity: Insufficiently Active (09/17/2023)   Exercise Vital Sign    Days of Exercise per Week: 2 days    Minutes of Exercise per Session: 20 min  Stress: No Stress Concern Present (09/17/2023)   [employer] of Occupational Health - Occupational Stress Questionnaire    Feeling of Stress: Only a little  Social Connections: Moderately Integrated (09/17/2023)   Social Connection and Isolation Panel    Frequency of Communication with  Friends and Family: More than three times a week    Frequency of Social Gatherings with Friends and Family: Once a week    Attends Religious Services: More than 4 times per year    Active Member of [employer] or Organizations: No    Attends Banker Meetings: Never    Marital Status: Married    Tobacco Counseling Ready to quit: Not Answered Counseling given: Not Answered Tobacco comments: 0.5 PPD -09/10/2023    Clinical Intake:  Pre-visit preparation completed: Yes  Pain : No/denies pain     BMI - recorded: 23.2 Nutritional  Status: BMI of 19-24  Normal Nutritional Risks: None Diabetes: No  Lab Results  Component Value Date   HGBA1C 5.7 (H) 06/12/2020   HGBA1C 5.6 03/19/2015     How often do you need to have someone help you when you read instructions, pamphlets, or other written materials from your doctor or pharmacy?: 1 - Never  Interpreter Needed?: No  Information entered by :: JHONNIE DAS, LPN   Activities of Daily Living     09/17/2023    9:38 AM 11/27/2022   10:25 AM  In your present state of health, do you have any difficulty performing the following activities:  Hearing? 0 0  Vision? 0 0  Difficulty concentrating or making decisions? 0 0  Walking or climbing stairs? 1   Comment SHOB   Dressing or bathing? 0   Doing errands, shopping? 0   Preparing Food and eating ? N   Using the Toilet? N   In the past six months, have you accidently leaked urine? N   Do you have problems with loss of bowel control? N   Managing your Medications? N   Managing your Finances? N   Housekeeping or managing your Housekeeping? N     Patient Care Team: [name], MD as PCP - General (Family Medicine) Tamea Dedra CROME, MD as Consulting Physician (Pulmonary Disease) Pa, [facility] (Optometry)  I have updated your Care Teams any recent Medical Services you may have received from other providers in the past year.     Assessment:   This is a  routine wellness examination for Andrea [name].  Hearing/Vision screen Hearing Screening - Comments:: NO AIDS Vision Screening - Comments:: READERS, HAS LENS IMPLANTS IN LEFT EYE- [facility]   Goals Addressed             This Visit's Progress    DIET - INCREASE WATER INTAKE         Depression Screen     09/17/2023    9:35 AM 12/27/2022    2:06 PM 08/21/2022    8:46 AM 06/17/2022    1:28 PM 01/09/2022    9:16 AM 05/14/2021   11:40 AM 04/12/2021    9:47 AM  PHQ 2/9 Scores  PHQ - 2 Score 1 2 2 3 1 1  0  PHQ- 9 Score 1 8 10 12 8 12      Fall Risk     09/17/2023    9:38 AM 12/27/2022    2:06 PM 08/21/2022    8:42 AM 06/17/2022    1:28 PM 01/09/2022    9:16 AM  Fall Risk   Falls in the past year? 0 0 0 0 0  Number falls in past yr: 0 0 0 0 0  Injury with Fall? 0 0 0 0 0  Risk for fall due to : No Fall Risks No Fall Risks No Fall Risks  No Fall Risks  Follow up Falls evaluation completed;Falls prevention discussed Follow up appointment Falls prevention discussed;Education provided  Falls evaluation completed      Data saved with a previous flowsheet row definition    MEDICARE RISK AT HOME:  Medicare Risk at Home Any stairs in or around the home?: Yes If so, are there any without handrails?: No Home free of loose throw rugs in walkways, pet beds, electrical cords, etc?: Yes Adequate lighting in your home to reduce risk of falls?: Yes Life alert?: No Use of a cane, walker or w/c?: No Grab bars in the  bathroom?: Yes Shower chair or bench in shower?: Yes Elevated toilet seat or a handicapped toilet?: No  TIMED UP AND GO:  Was the test performed?  No  Cognitive Function: 6CIT completed        09/17/2023    9:40 AM 08/21/2022    8:57 AM  6CIT Screen  What Year? 0 points 0 points  What month? 0 points 0 points  What time? 0 points 0 points  Count back from 20 0 points 0 points  Months in reverse 0 points 0 points  Repeat phrase 2 points 2 points  Total Score 2 points 2  points    Immunizations Immunization History  Administered Date(s) Administered   Moderna Sars-Covid-2 Vaccination 11/28/2019, 12/26/2019   Pneumococcal Conjugate-13 11/20/2012   Pneumococcal Polysaccharide-23 08/10/2012    Screening Tests Health Maintenance  Topic Date Due   DTaP/Tdap/Td (1 - Tdap) Never done   Hepatitis B Vaccines (1 of 3 - 19+ 3-dose series) Never done   Zoster Vaccines- Shingrix (1 of 2) Never done   Cervical Cancer Screening (HPV/Pap Cotest)  Never done   Pneumococcal Vaccine 66-52 Years old (3 of 3 - PCV20 or PCV21) 11/20/2017   MAMMOGRAM  11/26/2018   COVID-19 Vaccine (3 - Moderna risk series) 01/23/2020   Lung Cancer Screening  01/09/2021   INFLUENZA VACCINE  10/03/2023   Medicare Annual Wellness (AWV)  09/16/2024   Colonoscopy  10/04/2024   Hepatitis C Screening  Completed   HIV Screening  Completed   HPV VACCINES  Aged Out   Meningococcal B Vaccine  Aged Out    Health Maintenance  Health Maintenance Due  Topic Date Due   DTaP/Tdap/Td (1 - Tdap) Never done   Hepatitis B Vaccines (1 of 3 - 19+ 3-dose series) Never done   Zoster Vaccines- Shingrix (1 of 2) Never done   Cervical Cancer Screening (HPV/Pap Cotest)  Never done   Pneumococcal Vaccine 103-79 Years old (3 of 3 - PCV20 or PCV21) 11/20/2017   MAMMOGRAM  11/26/2018   COVID-19 Vaccine (3 - Moderna risk series) 01/23/2020   Lung Cancer Screening  01/09/2021   Health Maintenance Items Addressed: Referral sent to [facility] (smoker/hx smoking)  Additional Screening:  Vision Screening: Recommended annual ophthalmology exams for early detection of glaucoma and other disorders of the eye. Would you like a referral to an eye doctor? No    Dental Screening: Recommended annual dental exams for proper oral hygiene  Community Resource Referral / Chronic Care Management: CRR required this visit?  No   CCM required this visit?  No   Plan:    I have personally reviewed and noted  the following in the patient's chart:   Medical and social history Use of alcohol, tobacco or illicit drugs  Current medications and supplements including opioid prescriptions. Patient is not currently taking opioid prescriptions. Functional ability and status Nutritional status Physical activity Advanced directives List of other physicians Hospitalizations, surgeries, and ER visits in previous 12 months Vitals Screenings to include cognitive, depression, and falls Referrals and appointments  In addition, I have reviewed and discussed with patient certain preventive protocols, quality metrics, and best practice recommendations. A written personalized care plan for preventive services as well as general preventive health recommendations were provided to patient.   Jhonnie GORMAN Das, LPN   [id number]/83/7974   After Visit Summary: (MyChart) Due to this being a telephonic visit, the after visit summary with patients personalized plan was offered to patient via MyChart  Notes: LUNG CA SCREEN REFERRAL SENT

## 2023-09-17 NOTE — Patient Instructions (Signed)
 Andrea [name] , Thank you for taking time out of your busy schedule to complete your Annual Wellness Visit with me. I enjoyed our conversation and look forward to speaking with you again next year. I, as well as your care team,  appreciate your ongoing commitment to your health goals. Please review the following plan we discussed and let me know if I can assist you in the future.  REFERRAL SENT FOR LUNG CA SCREENING  Follow up Visits: Next Medicare AWV with our clinical staff:   09/21/24 @ 2:30 PM BY PHONE Have you seen your provider in the last 6 months (3 months if uncontrolled diabetes)? Yes  Clinician Recommendations:  Aim for 30 minutes of exercise or brisk walking, 6-8 glasses of water, and 5 servings of fruits and vegetables each day. TAKE CARE!      This is a list of the screening recommended for you and due dates:  Health Maintenance  Topic Date Due   DTaP/Tdap/Td vaccine (1 - Tdap) Never done   Hepatitis B Vaccine (1 of 3 - 19+ 3-dose series) Never done   Zoster (Shingles) Vaccine (1 of 2) Never done   Pap with HPV screening  Never done   Pneumococcal Vaccination (3 of 3 - PCV20 or PCV21) 11/20/2017   Mammogram  11/26/2018   COVID-19 Vaccine (3 - Moderna risk series) 01/23/2020   Screening for Lung Cancer  01/09/2021   Flu Shot  10/03/2023   Medicare Annual Wellness Visit  09/16/2024   Colon Cancer Screening  10/04/2024   Hepatitis C Screening  Completed   HIV Screening  Completed   HPV Vaccine  Aged Out   Meningitis B Vaccine  Aged Out    Advanced directives: (ACP Link)Information on Advanced Care Planning can be found at [employer] [facility] Advance Health Care Directives Advance Health Care Directives. [email or web address]  Advance Care Planning is important because it:  [x]  Makes sure you receive the medical care that is consistent with your values, goals, and preferences  [x]  It provides guidance to your family and loved ones and reduces their decisional burden about  whether or not they are making the right decisions based on your wishes.  Follow the link provided in your after visit summary or read over the paperwork we have mailed to you to help you started getting your Advance Directives in place. If you need assistance in completing these, please reach out to us  so that we can help you!

## 2023-09-18 ENCOUNTER — Encounter: Payer: Self-pay | Admitting: Pulmonary Disease

## 2023-09-22 DIAGNOSIS — J9621 Acute and chronic respiratory failure with hypoxia: Secondary | ICD-10-CM | POA: Diagnosis not present

## 2023-09-29 DIAGNOSIS — J449 Chronic obstructive pulmonary disease, unspecified: Secondary | ICD-10-CM | POA: Diagnosis not present

## 2023-09-30 NOTE — Progress Notes (Signed)
 This encounter was created in error - please disregard.

## 2023-10-07 ENCOUNTER — Other Ambulatory Visit: Payer: Self-pay | Admitting: Gastroenterology

## 2023-10-08 ENCOUNTER — Telehealth: Admitting: Physician Assistant

## 2023-10-08 DIAGNOSIS — J441 Chronic obstructive pulmonary disease with (acute) exacerbation: Secondary | ICD-10-CM | POA: Diagnosis not present

## 2023-10-08 MED ORDER — PREDNISONE 10 MG (21) PO TBPK: ORAL_TABLET | ORAL | 0 refills | Status: DC

## 2023-10-08 MED ORDER — BENZONATATE 100 MG PO CAPS: 100.0000 mg | ORAL_CAPSULE | Freq: Three times a day (TID) | ORAL | 0 refills | Status: DC | PRN

## 2023-10-08 NOTE — Progress Notes (Signed)
 E-Visit for Cough   We are sorry that you are not feeling well.  Here is how we plan to help!  Based on your presentation I believe you most likely have an exacerbation of your COPD.    In addition you may use A non-prescription cough medication called Mucinex  DM: take 2 tablets every 12 hours. and A prescription cough medication called Tessalon  Perles 100mg . You may take 1-2 capsules every 8 hours as needed for your cough. These can be used together.   Prednisone  10 mg daily for 6 days (see taper instructions below)  Directions for 6 day taper: Day 1: 2 tablets before breakfast, 1 after both lunch & dinner and 2 at bedtime Day 2: 1 tab before breakfast, 1 after both lunch & dinner and 2 at bedtime Day 3: 1 tab at each meal & 1 at bedtime Day 4: 1 tab at breakfast, 1 at lunch, 1 at bedtime Day 5: 1 tab at breakfast & 1 tab at bedtime Day 6: 1 tab at breakfast  From your responses in the eVisit questionnaire you describe inflammation in the upper respiratory tract which is causing a significant cough.  This is commonly called Bronchitis and has four common causes:   Allergies Viral Infections Acid Reflux Bacterial Infection Allergies, viruses and acid reflux are treated by controlling symptoms or eliminating the cause. An example might be a cough caused by taking certain blood pressure medications. You stop the cough by changing the medication. Another example might be a cough caused by acid reflux. Controlling the reflux helps control the cough.  USE OF BRONCHODILATOR (RESCUE) INHALERS: There is a risk from using your bronchodilator too frequently.  The risk is that over-reliance on a medication which only relaxes the muscles surrounding the breathing tubes can reduce the effectiveness of medications prescribed to reduce swelling and congestion of the tubes themselves.  Although you feel brief relief from the bronchodilator inhaler, your asthma may actually be worsening with the tubes  becoming more swollen and filled with mucus.  This can delay other crucial treatments, such as oral steroid medications. If you need to use a bronchodilator inhaler daily, several times per day, you should discuss this with your provider.  There are probably better treatments that could be used to keep your asthma under control.     HOME CARE Only take medications as instructed by your medical team. Complete the entire course of an antibiotic. Drink plenty of fluids and get plenty of rest. Avoid close contacts especially the very young and the elderly Cover your mouth if you cough or cough into your sleeve. Always remember to wash your hands A steam or ultrasonic humidifier can help congestion.   GET HELP RIGHT AWAY IF: You develop worsening fever. You become short of breath You cough up blood. Your symptoms persist after you have completed your treatment plan MAKE SURE YOU  Understand these instructions. Will watch your condition. Will get help right away if you are not doing well or get worse.    Thank you for choosing an e-visit.  Your e-visit answers were reviewed by a board certified advanced clinical practitioner to complete your personal care plan. Depending upon the condition, your plan could have included both over the counter or prescription medications.  Please review your pharmacy choice. Make sure the pharmacy is open so you can pick up prescription now. If there is a problem, you may contact your provider through MyChart messaging and have the prescription routed to another  pharmacy.  Your safety is important to us . If you have drug allergies check your prescription carefully.   For the next 24 hours you can use MyChart to ask questions about today's visit, request a non-urgent call back, or ask for a work or school excuse. You will get an email in the next two days asking about your experience. I hope that your e-visit has been valuable and will speed your  recovery.    I have spent 5 minutes in review of e-visit questionnaire, review and updating patient chart, medical decision making and response to patient.   Delon CHRISTELLA Dickinson, PA-C

## 2023-10-11 ENCOUNTER — Other Ambulatory Visit: Payer: Self-pay | Admitting: Family Medicine

## 2023-10-23 DIAGNOSIS — J9621 Acute and chronic respiratory failure with hypoxia: Secondary | ICD-10-CM | POA: Diagnosis not present

## 2023-10-30 DIAGNOSIS — J449 Chronic obstructive pulmonary disease, unspecified: Secondary | ICD-10-CM | POA: Diagnosis not present

## 2023-11-05 ENCOUNTER — Ambulatory Visit: Payer: Self-pay

## 2023-11-05 NOTE — Telephone Encounter (Signed)
 FYI Only or Action Required?: FYI only for provider.  Patient was last seen in primary care on 07/25/2023 by Andrea Nancyann BRAVO, MD.  Called Nurse Triage reporting Leg Pain.  Symptoms began ongoing and worsening.  Interventions attempted: Rest, hydration, or home remedies.  Symptoms are: gradually worsening.  Triage Disposition: See PCP Within 2 Weeks  Patient/caregiver understands and will follow disposition?: Yes     Reason for CRM: Patient having leg cramps and is still feeling exhausted. Patient states her symptoms are worsening and is requesting an appointment.   Requesting an appointment, [phone]. Reason for Disposition  Weakness is a chronic symptom (recurrent or ongoing AND present > 4 weeks)  [1] MILD pain (e.g., does not interfere with normal activities) AND [2] present > 7 days  Answer Assessment - Initial Assessment Questions 1. ONSET: When did the pain start?      3 to 4 months 2. LOCATION: Where is the pain located?      Right leg has cramping 3. PAIN: How bad is the pain?    (Scale 1-10; or mild, moderate, severe)     moderate 4. WORK OR EXERCISE: Has there been any recent work or exercise that involved this part of the body?      no 5. CAUSE: What do you think is causing the leg pain?     unknown 6. OTHER SYMPTOMS: Do you have any other symptoms? (e.g., chest pain, back pain, breathing difficulty, swelling, rash, fever, numbness, weakness)     fatigue 7. PREGNANCY: Is there any chance you are pregnant? When was your last menstrual period?     na  Answer Assessment - Initial Assessment Questions 1. DESCRIPTION: Describe how you are feeling.     Tired all the times 2. SEVERITY: How bad is it?  Can you stand and walk?     Can stand or walk but it takes a lot of energy 3. ONSET: When did these symptoms begin? (e.g., hours, days, weeks, months)    Ongoing and worsening 4. CAUSE: What do you think is causing the weakness or  fatigue? (e.g., not drinking enough fluids, medical problem, trouble sleeping)     unknown 5. NEW MEDICINES:  Have you started on any new medicines recently? (e.g., opioid pain medicines, benzodiazepines, muscle relaxants, antidepressants, antihistamines, neuroleptics, beta blockers)     no 6. OTHER SYMPTOMS: Do you have any other symptoms? (e.g., chest pain, fever, cough, SOB, vomiting, diarrhea, bleeding, other areas of pain)     no 7. PREGNANCY: Is there any chance you are pregnant? When was your last menstrual period?     na  Pt would like a CBC  Protocols used: Leg Pain-A-AH, Weakness (Generalized) and Fatigue-A-AH

## 2023-11-08 ENCOUNTER — Telehealth: Admitting: Family

## 2023-11-08 DIAGNOSIS — J019 Acute sinusitis, unspecified: Secondary | ICD-10-CM

## 2023-11-08 MED ORDER — AMOXICILLIN-POT CLAVULANATE 875-125 MG PO TABS: 1.0000 | ORAL_TABLET | Freq: Two times a day (BID) | ORAL | 0 refills | Status: DC

## 2023-11-08 NOTE — Progress Notes (Signed)
Approximately 5 minutes was spent documenting and reviewing patient's chart.

## 2023-11-08 NOTE — Progress Notes (Signed)
 E-Visit for Sinus Problems  We are sorry that you are not feeling well.  Here is how we plan to help!  Based on what you have shared with me it looks like you have sinusitis.  Sinusitis is inflammation and infection in the sinus cavities of the head.  Based on your presentation I believe you most likely have Acute Bacterial Sinusitis.  This is an infection caused by bacteria and is treated with antibiotics. I have prescribed Augmentin 875mg /125mg  one tablet twice daily with food, for 7 days. You may use an oral decongestant such as Mucinex D or if you have glaucoma or high blood pressure use plain Mucinex. Saline nasal spray help and can safely be used as often as needed for congestion.  If you develop worsening sinus pain, fever or notice severe headache and vision changes, or if symptoms are not better after completion of antibiotic, please schedule an appointment with a health care provider.    Sinus infections are not as easily transmitted as other respiratory infection, however we still recommend that you avoid close contact with loved ones, especially the very young and elderly.  Remember to wash your hands thoroughly throughout the day as this is the number one way to prevent the spread of infection!  Home Care: Only take medications as instructed by your medical team. Complete the entire course of an antibiotic. Do not take these medications with alcohol. A steam or ultrasonic humidifier can help congestion.  You can place a towel over your head and breathe in the steam from hot water coming from a faucet. Avoid close contacts especially the very young and the elderly. Cover your mouth when you cough or sneeze. Always remember to wash your hands.  Get Help Right Away If: You develop worsening fever or sinus pain. You develop a severe head ache or visual changes. Your symptoms persist after you have completed your treatment plan.  Make sure you Understand these instructions. Will watch  your condition. Will get help right away if you are not doing well or get worse.  Thank you for choosing an e-visit.  Your e-visit answers were reviewed by a board certified advanced clinical practitioner to complete your personal care plan. Depending upon the condition, your plan could have included both over the counter or prescription medications.  Please review your pharmacy choice. Make sure the pharmacy is open so you can pick up prescription now. If there is a problem, you may contact your provider through [employer] and have the prescription routed to another pharmacy.  Your safety is important to us . If you have drug allergies check your prescription carefully.   For the next 24 hours you can use MyChart to ask questions about today's visit, request a non-urgent call back, or ask for a work or school excuse. You will get an email in the next two days asking about your experience. I hope that your e-visit has been valuable and will speed your recovery.

## 2023-11-10 ENCOUNTER — Ambulatory Visit: Payer: Self-pay

## 2023-11-10 NOTE — Telephone Encounter (Signed)
 FYI Only or Action Required?: FYI only for provider.  Patient was last seen in primary care on 07/25/2023 by [name], MD.  Called Nurse Triage reporting Sinusitis.  Symptoms began several days ago.  Interventions attempted: OTC medications: Mucinex  and Prescription medications: Augmentin .  Symptoms are: unchanged.  Triage Disposition: See Physician Within 24 Hours  Patient/caregiver understands and will follow disposition?: Yes                             Copied from CRM #[id number]. Topic: Clinical - Red Word Triage >> Nov 10, 2023  4:28 PM Tobias L wrote: Red Word that prompted transfer to Nurse Triage: Patient still having pain around face, and eyes. Patient has snotty nose and blowing out yellow stuff. Patient had e visit w/ a provider and was given augmentin  but states they is not working. Patient requesting a z pack to be sent in, patient states that works better for her.   Has upcoming appointment on Monday with Dr. Gasper Reason for Disposition  [1] Taking antibiotic > 48 hours (2 days) AND [2] fever persists  Answer Assessment - Initial Assessment Questions 1. ANTIBIOTIC: What antibiotic are you taking? How many times a day?     Augmentin  2. ONSET: When was the antibiotic started?     11/08/23, 2 days ago 3. PAIN: How bad is the pain?   (Scale 0-10; or none, mild, moderate or severe)     Forehead and surrounding eyes, rates pain about a 5 4. FEVER: Do you have a fever? If Yes, ask: What is it, how was it measured, and when did it start?      Low grade 5. SYMPTOMS: Are there any other symptoms you're concerned about? If Yes, ask: When did it start?     Nasal drainage, left earache, cough    Virtual visit on 11/08/23.  Protocols used: Sinus Infection on Antibiotic Follow-up Call-A-AH

## 2023-11-11 ENCOUNTER — Encounter: Payer: Self-pay | Admitting: Family Medicine

## 2023-11-11 ENCOUNTER — Ambulatory Visit (INDEPENDENT_AMBULATORY_CARE_PROVIDER_SITE_OTHER): Admitting: Family Medicine

## 2023-11-11 VITALS — BP 108/60 | HR 90 | Temp 97.5°F | Ht 63.0 in | Wt 129.1 lb

## 2023-11-11 DIAGNOSIS — J0111 Acute recurrent frontal sinusitis: Secondary | ICD-10-CM

## 2023-11-11 DIAGNOSIS — J441 Chronic obstructive pulmonary disease with (acute) exacerbation: Secondary | ICD-10-CM | POA: Diagnosis not present

## 2023-11-11 DIAGNOSIS — R509 Fever, unspecified: Secondary | ICD-10-CM

## 2023-11-11 DIAGNOSIS — J3489 Other specified disorders of nose and nasal sinuses: Secondary | ICD-10-CM | POA: Diagnosis not present

## 2023-11-11 LAB — POC COVID19 BINAXNOW: SARS Coronavirus 2 Ag: NEGATIVE

## 2023-11-11 LAB — POCT INFLUENZA A/B
Influenza A, POC: NEGATIVE
Influenza B, POC: NEGATIVE

## 2023-11-11 MED ORDER — LEVOFLOXACIN 750 MG PO TABS: 750.0000 mg | ORAL_TABLET | Freq: Every day | ORAL | 0 refills | Status: DC

## 2023-11-11 MED ORDER — PREDNISONE 20 MG PO TABS: 40.0000 mg | ORAL_TABLET | Freq: Every day | ORAL | 0 refills | Status: AC

## 2023-11-11 NOTE — Progress Notes (Signed)
 ACUTE VISIT   Patient: Andrea [name]   DOB: 08-20-1965   58 y.o. Female  MRN: [id number]   PCP: [name], MD  Chief Complaint  Patient presents with   Sinusitis    Patient reports sinus pain/ pressure, low grade fever, chills and congestion with yellow mucus since last week. Patient self swabbed for covid Sunday and was negative    Subjective    HPI HPI     Sinusitis    Additional comments: Patient reports sinus pain/ pressure, low grade fever, chills and congestion with yellow mucus since last week. Patient self swabbed for covid Sunday and was negative       Last edited by Cherry Chiquita HERO, CMA on 11/11/2023  4:04 PM.       Discussed the use of AI scribe software for clinical note transcription with the patient, who gave verbal consent to proceed.  History of Present Illness Andrea [name] is a 58 year old female with COPD who presents with sinus pain and low-grade fever.  She has been experiencing sinus pain and a low-grade fever for about a week. The fever has resolved, with her current temperature at 9F. The sinus pain is described as pressure that worsens when bending over, feeling like 'it's gonna come out of my eyeballs.' She also has congestion with yellow mucus.  She has a history of multiple allergies, including Bactrim , Ceftomir, doxycycline , and fentanyl . She has previously tolerated amoxicillin , which she took back in May. Recently, she was prescribed Augmentin  on Saturday during an e-visit, but reports no improvement in her symptoms. She typically takes a Z-Pak or Levaquin  for sinus infections, often accompanied by prednisone  due to her COPD.  Her past medical history includes COPD, lupus, hypertension, and lung nodules. She uses inhalers and has been taking Mucinex  and Flonase . She also uses two liters of oxygen  at night. She mentions feeling pressure and drainage when congested.  A home COVID test was negative three days ago, and she is  undergoing repeat testing for COVID and influenza today.     Medications: Outpatient Medications Prior to Visit  Medication Sig   albuterol  (PROVENTIL ) (2.5 MG/3ML) 0.083% nebulizer solution USE 1 VIAL IN NEBULIZER EVERY 4 (FOUR) HOURS AS NEEDED FOR WHEEZING OR SHORTNESS OF BREATH.   albuterol  (VENTOLIN  HFA) 108 (90 Base) MCG/ACT inhaler Inhale 2 puffs into the lungs every 6 (six) hours as needed for wheezing or shortness of breath.   amoxicillin -clavulanate (AUGMENTIN ) 875-125 MG tablet Take 1 tablet by mouth 2 (two) times daily.   atorvastatin  (LIPITOR) 40 MG tablet TAKE 1 TABLET BY MOUTH DAILY   budesonide -glycopyrrolate -formoterol  (BREZTRI  AEROSPHERE) 160-9-4.8 MCG/ACT AERO inhaler Inhale 2 puffs into the lungs in the morning and at bedtime.   clonazePAM  (KLONOPIN ) 1 MG tablet Take 1 tablet (1 mg total) by mouth 2 (two) times daily as needed. TAKES EVERY DAY   dexlansoprazole  (DEXILANT ) 60 MG capsule TAKE 1 CAPSULE BY MOUTH EVERY DAY   fluticasone  (FLONASE ) 50 MCG/ACT nasal spray Place 2 sprays into both nostrils as needed.   hydroxychloroquine  (PLAQUENIL ) 200 MG tablet TAKE 2 TABLETS BY MOUTH DAILY   Misc. Devices (PULSE OXIMETER DELUXE) MISC Use daily to monitor oxygen  levels   Nebulizers (COMPRESSOR/NEBULIZER) MISC Supplies only   Nebulizers (COMPRESSOR/NEBULIZER) MISC Use as directed   nitroGLYCERIN  (NITROSTAT ) 0.4 MG SL tablet Place 1 tablet (0.4 mg total) under the tongue every 5 (five) minutes as needed for chest pain.   OXYGEN  Inhale  2 L into the lungs as needed (at while sleeping).   prednisoLONE  sodium phosphate  (INFLAMASE FORTE ) 1 % ophthalmic solution Place 1 drop into the left eye as needed.   Respiratory Therapy Supplies (FLUTTER) DEVI 1 Device by Does not apply route daily.   Spacer/Aero-Holding Chambers (AEROCHAMBER MV) inhaler Use as instructed   [DISCONTINUED] benzonatate  (TESSALON ) 100 MG capsule Take 1-2 capsules (100-200 mg total) by mouth 3 (three) times daily as  needed.   [DISCONTINUED] montelukast  (SINGULAIR ) 10 MG tablet Take 1 tablet (10 mg total) by mouth at bedtime.   [DISCONTINUED] predniSONE  (STERAPRED UNI-PAK 21 TAB) 10 MG (21) TBPK tablet 6 day taper; take as directed on package instructions   No facility-administered medications prior to visit.        Objective    BP 108/60 (BP Location: Right Arm, Patient Position: Sitting, Cuff Size: Normal)   Pulse 90   Temp (!) 97.5 F (36.4 C) (Oral)   Ht 5' 3 (1.6 m)   Wt 129 lb 1.6 oz (58.6 kg)   SpO2 97%   BMI 22.87 kg/m    Physical Exam   Physical Exam VITALS: T- 97.0 GENERAL: Ill appearing, non-toxic, not diaphoretic NECK: Bilateral anterior cervical lymphadenopathy, tender, swollen lymph nodes bilaterally CHEST: Decreased breath sounds bilaterally, no wheezes, no crackles, normal respiratory effort CARDIOVASCULAR: Regular rhythm   No results found for any visits on 11/11/23.  Assessment & Plan     Assessment and Plan Assessment & Plan Acute sinusitis not responsive to Augmentin  Acute sinusitis with symptoms of sinus pain, pressure, congestion, and yellow mucus for about a week. Not responsive to Augmentin . Negative COVID and influenza tests. Considered differential diagnosis of COPD exacerbation due to overlapping symptoms, but primarily assessed as sinusitis. - Prescribe Levaquin  750 mg for 5 days - Advise on hydration and rest - Instruct to monitor for signs of COPD exacerbation and use prednisone  40 mg for 5 days if symptoms worsen - Advise on infection control measures such as wearing a mask and hand hygiene  Chronic obstructive pulmonary disease (COPD) with home oxygen  use COPD with home oxygen  use. No current signs of respiratory distress, but decreased breath sounds noted. Risk of exacerbation due to sinusitis. - Instruct to use prednisone  40 mg for 5 days if symptoms of COPD exacerbation occur - Advise to seek emergency care if oxygen  saturation drops to low 80s  or 70s  Bilateral anterior cervical lymphadenopathy Tender bilateral anterior cervical lymphadenopathy likely related to acute sinusitis.  Multiple drug allergies (Bactrim , cefdinir, doxycycline , fentanyl ) Multiple drug allergies limiting antibiotic options. Tolerates amoxicillin  and azithromycin .      No follow-ups on file.        Rockie Agent, MD  [facility] [phone] (phone) [phone] (fax)  [facility]

## 2023-11-17 ENCOUNTER — Encounter: Payer: Self-pay | Admitting: Family Medicine

## 2023-11-17 ENCOUNTER — Ambulatory Visit (INDEPENDENT_AMBULATORY_CARE_PROVIDER_SITE_OTHER): Admitting: Family Medicine

## 2023-11-17 VITALS — BP 107/56 | HR 85 | Resp 16 | Ht 65.0 in | Wt 129.0 lb

## 2023-11-17 DIAGNOSIS — R5383 Other fatigue: Secondary | ICD-10-CM | POA: Diagnosis not present

## 2023-11-17 DIAGNOSIS — J0111 Acute recurrent frontal sinusitis: Secondary | ICD-10-CM | POA: Diagnosis not present

## 2023-11-17 DIAGNOSIS — R252 Cramp and spasm: Secondary | ICD-10-CM

## 2023-11-17 DIAGNOSIS — R634 Abnormal weight loss: Secondary | ICD-10-CM

## 2023-11-17 DIAGNOSIS — E611 Iron deficiency: Secondary | ICD-10-CM | POA: Diagnosis not present

## 2023-11-17 MED ORDER — LEVOFLOXACIN 500 MG PO TABS: 500.0000 mg | ORAL_TABLET | Freq: Every day | ORAL | 0 refills | Status: AC

## 2023-11-17 NOTE — Progress Notes (Signed)
 Established patient visit   Patient: Andrea [name]   DOB: Sep 18, 1965   58 y.o. Female  MRN: [id number] Visit Date: 11/17/2023  Today's healthcare provider: Nancyann Perry, MD   Chief Complaint  Patient presents with   Acute Visit    tired & leg cramps. Pt states she is still having problems with her sinus.   Subjective    Discussed the use of AI scribe software for clinical note transcription with the patient, who gave verbal consent to proceed.  History of Present Illness   Andrea [name] is a 58 year old female who presents with persistent sinus infection symptoms and fatigue.  She has ongoing symptoms of a sinus infection, including yellow discharge and congestion, particularly affecting her left ear. She completed a course of levofloxacin  750 mg, which she had to split into two doses due to its strength, but symptoms persist despite finishing the medication yesterday. She experiences dizziness and has had dizzy spells while driving.  She experiences persistent fatigue despite taking vitamins. She is concerned about unintentional weight loss and is unsure of the cause. No nausea, bloating, or changes in bowel habits. She has a history of hernia surgery, which sometimes causes stomach pain.  She reports experiencing leg cramps, primarily in her left leg, which occur at night and start in the ankle before moving to the calf. The cramps are severe enough to bring her to tears and require her to walk them off. She has been on iron supplements for about four months, which sometimes cause stomach discomfort. She drinks primarily water and one cup of coffee daily, with occasional tea.  No swelling in her hands or feet. She has swelling in her ankle after a fall. She has fallen twice in the past year due to balance issues, resulting in minor injuries.     Wt Readings from Last 10 Encounters:  11/17/23 129 lb (58.5 kg)  11/11/23 129 lb 1.6 oz (58.6 kg)  09/10/23 131 lb (59.4 kg)   09/02/23 131 lb 12.8 oz (59.8 kg)  07/25/23 131 lb 9.6 oz (59.7 kg)  07/25/23 131 lb (59.4 kg)  07/22/23 135 lb (61.2 kg)  04/25/23 135 lb 3.2 oz (61.3 kg)  04/07/23 135 lb 6.4 oz (61.4 kg)  04/01/23 135 lb (61.2 kg)      Medications: Outpatient Medications Prior to Visit  Medication Sig   albuterol  (PROVENTIL ) (2.5 MG/3ML) 0.083% nebulizer solution USE 1 VIAL IN NEBULIZER EVERY 4 (FOUR) HOURS AS NEEDED FOR WHEEZING OR SHORTNESS OF BREATH.   albuterol  (VENTOLIN  HFA) 108 (90 Base) MCG/ACT inhaler Inhale 2 puffs into the lungs every 6 (six) hours as needed for wheezing or shortness of breath.   atorvastatin  (LIPITOR) 40 MG tablet TAKE 1 TABLET BY MOUTH DAILY   budesonide -glycopyrrolate -formoterol  (BREZTRI  AEROSPHERE) 160-9-4.8 MCG/ACT AERO inhaler Inhale 2 puffs into the lungs in the morning and at bedtime.   clonazePAM  (KLONOPIN ) 1 MG tablet Take 1 tablet (1 mg total) by mouth 2 (two) times daily as needed. TAKES EVERY DAY   dexlansoprazole  (DEXILANT ) 60 MG capsule TAKE 1 CAPSULE BY MOUTH EVERY DAY   fluticasone  (FLONASE ) 50 MCG/ACT nasal spray Place 2 sprays into both nostrils as needed.   hydroxychloroquine  (PLAQUENIL ) 200 MG tablet TAKE 2 TABLETS BY MOUTH DAILY   Misc. Devices (PULSE OXIMETER DELUXE) MISC Use daily to monitor oxygen  levels   Nebulizers (COMPRESSOR/NEBULIZER) MISC Supplies only   Nebulizers (COMPRESSOR/NEBULIZER) MISC Use as directed   nitroGLYCERIN  (NITROSTAT ) 0.4  MG SL tablet Place 1 tablet (0.4 mg total) under the tongue every 5 (five) minutes as needed for chest pain.   OXYGEN  Inhale 2 L into the lungs as needed (at while sleeping).   prednisoLONE  sodium phosphate  (INFLAMASE FORTE ) 1 % ophthalmic solution Place 1 drop into the left eye as needed.   Respiratory Therapy Supplies (FLUTTER) DEVI 1 Device by Does not apply route daily.   Spacer/Aero-Holding Chambers (AEROCHAMBER MV) inhaler Use as instructed   No facility-administered medications prior to visit.    Review of Systems  Constitutional:  Negative for appetite change, chills, fatigue and fever.  Respiratory:  Negative for chest tightness and shortness of breath.   Cardiovascular:  Negative for chest pain and palpitations.  Gastrointestinal:  Negative for abdominal pain, nausea and vomiting.  Neurological:  Negative for dizziness and weakness.       Objective    BP (!) 107/56 (BP Location: Right Arm, Patient Position: Sitting, Cuff Size: Normal)   Pulse 85   Resp 16   Ht 5' 5 (1.651 m)   Wt 129 lb (58.5 kg)   SpO2 98%   BMI 21.47 kg/m   Physical Exam   General Appearance:    Well developed, well nourished female, alert, cooperative, in no acute distress  HENT:   bilateral TM normal without fluid or infection, neck without nodes, throat normal without erythema or exudate, sinuses nontender, and nasal mucosa congested  Eyes:    PERRL, conjunctiva/corneas clear, EOM's intact       Lungs:     Clear to auscultation bilaterally, respirations unlabored  Heart:    Normal heart rate. Normal rhythm. No murmurs, rubs, or gallops.    Neurologic:   Awake, alert, oriented x 3. No apparent focal neurological           defect.       Assessment & Plan     1. Acute recurrent frontal sinusitis (Primary) Improved but not resolved after 5 days levofloxacin .  - levofloxacin  (LEVAQUIN ) 500 MG tablet; Take 1 tablet (500 mg total) by mouth daily for 5 days.  Dispense: 5 tablet; Refill: 0  2. Other fatigue  - Comprehensive metabolic panel with GFR - TSH  3. Iron deficiency Reports she is taking iron supplements consistently.  - CBC - Iron, TIBC and Ferritin Panel  4. Leg cramps  - Magnesium    5. Weight loss She is overdue for mammogram which she states she will call to schedule. Is due for lung cancer screening. Anticipate ordering after reviewing labs.       Nancyann Perry, MD  [facility] [phone] (phone) [phone] (fax)  [facility]

## 2023-11-18 ENCOUNTER — Ambulatory Visit: Payer: Self-pay | Admitting: Family Medicine

## 2023-11-18 ENCOUNTER — Encounter: Payer: Self-pay | Admitting: Family Medicine

## 2023-11-18 DIAGNOSIS — R0602 Shortness of breath: Secondary | ICD-10-CM

## 2023-11-18 DIAGNOSIS — R634 Abnormal weight loss: Secondary | ICD-10-CM

## 2023-11-18 DIAGNOSIS — Z720 Tobacco use: Secondary | ICD-10-CM

## 2023-11-18 LAB — CBC
Hematocrit: 43.3 % (ref 34.0–46.6)
Hemoglobin: 14.2 g/dL (ref 11.1–15.9)
MCH: 29 pg (ref 26.6–33.0)
MCHC: 32.8 g/dL (ref 31.5–35.7)
MCV: 89 fL (ref 79–97)
Platelets: 316 x10E3/uL (ref 150–450)
RBC: 4.89 x10E6/uL (ref 3.77–5.28)
RDW: 13.2 % (ref 11.7–15.4)
WBC: 9.6 x10E3/uL (ref 3.4–10.8)

## 2023-11-18 LAB — COMPREHENSIVE METABOLIC PANEL WITH GFR
ALT: 10 IU/L (ref 0–32)
AST: 14 IU/L (ref 0–40)
Albumin: 4 g/dL (ref 3.8–4.9)
Alkaline Phosphatase: 90 IU/L (ref 49–135)
BUN/Creatinine Ratio: 18 (ref 12–28)
BUN: 13 mg/dL (ref 8–27)
Bilirubin Total: 0.4 mg/dL (ref 0.0–1.2)
CO2: 24 mmol/L (ref 20–29)
Calcium: 9.9 mg/dL (ref 8.7–10.3)
Chloride: 101 mmol/L (ref 96–106)
Creatinine, Ser: 0.72 mg/dL (ref 0.57–1.00)
Globulin, Total: 2.7 g/dL (ref 1.5–4.5)
Glucose: 79 mg/dL (ref 70–99)
Potassium: 4.4 mmol/L (ref 3.5–5.2)
Sodium: 140 mmol/L (ref 134–144)
Total Protein: 6.7 g/dL (ref 6.0–8.5)
eGFR: 96 mL/min/1.73 (ref >59)

## 2023-11-18 LAB — IRON,TIBC AND FERRITIN PANEL
Ferritin: 67 ng/mL (ref 15–150)
Iron Saturation: 13 % — ABNORMAL LOW (ref 15–55)
Iron: 46 ug/dL (ref 27–159)
Total Iron Binding Capacity: 349 ug/dL (ref 250–450)
UIBC: 303 ug/dL (ref 131–425)

## 2023-11-18 LAB — MAGNESIUM: Magnesium: 2.1 mg/dL (ref 1.6–2.3)

## 2023-11-18 LAB — TSH: TSH: 1.83 u[IU]/mL (ref 0.450–4.500)

## 2023-11-23 DIAGNOSIS — J9621 Acute and chronic respiratory failure with hypoxia: Secondary | ICD-10-CM | POA: Diagnosis not present

## 2023-11-26 ENCOUNTER — Ambulatory Visit: Admission: RE | Admit: 2023-11-26 | Source: Ambulatory Visit

## 2023-11-27 ENCOUNTER — Ambulatory Visit
Admission: RE | Admit: 2023-11-27 | Discharge: 2023-11-27 | Disposition: A | Discharge status: Home / Self Care | Source: Ambulatory Visit | Attending: Family Medicine | Admitting: Family Medicine

## 2023-11-27 DIAGNOSIS — I700 Atherosclerosis of aorta: Secondary | ICD-10-CM | POA: Diagnosis not present

## 2023-11-27 DIAGNOSIS — J432 Centrilobular emphysema: Secondary | ICD-10-CM | POA: Diagnosis not present

## 2023-11-27 DIAGNOSIS — Z720 Tobacco use: Secondary | ICD-10-CM | POA: Insufficient documentation

## 2023-11-27 DIAGNOSIS — R634 Abnormal weight loss: Secondary | ICD-10-CM | POA: Insufficient documentation

## 2023-11-27 DIAGNOSIS — R0602 Shortness of breath: Secondary | ICD-10-CM | POA: Insufficient documentation

## 2023-11-30 ENCOUNTER — Encounter: Payer: Self-pay | Admitting: Emergency Medicine

## 2023-11-30 ENCOUNTER — Other Ambulatory Visit: Payer: Self-pay

## 2023-11-30 ENCOUNTER — Emergency Department

## 2023-11-30 ENCOUNTER — Observation Stay
Admission: EM | Admit: 2023-11-30 | Discharge: 2023-12-02 | Disposition: A | Discharge status: Home / Self Care | Attending: Hospitalist | Admitting: Hospitalist

## 2023-11-30 DIAGNOSIS — R188 Other ascites: Secondary | ICD-10-CM | POA: Diagnosis not present

## 2023-11-30 DIAGNOSIS — K449 Diaphragmatic hernia without obstruction or gangrene: Secondary | ICD-10-CM | POA: Insufficient documentation

## 2023-11-30 DIAGNOSIS — R197 Diarrhea, unspecified: Principal | ICD-10-CM

## 2023-11-30 DIAGNOSIS — E876 Hypokalemia: Secondary | ICD-10-CM | POA: Insufficient documentation

## 2023-11-30 DIAGNOSIS — E785 Hyperlipidemia, unspecified: Secondary | ICD-10-CM

## 2023-11-30 DIAGNOSIS — G2581 Restless legs syndrome: Secondary | ICD-10-CM | POA: Diagnosis present

## 2023-11-30 DIAGNOSIS — J449 Chronic obstructive pulmonary disease, unspecified: Secondary | ICD-10-CM

## 2023-11-30 DIAGNOSIS — M797 Fibromyalgia: Secondary | ICD-10-CM

## 2023-11-30 DIAGNOSIS — K5730 Diverticulosis of large intestine without perforation or abscess without bleeding: Secondary | ICD-10-CM | POA: Insufficient documentation

## 2023-11-30 DIAGNOSIS — R1084 Generalized abdominal pain: Secondary | ICD-10-CM

## 2023-11-30 DIAGNOSIS — F419 Anxiety disorder, unspecified: Secondary | ICD-10-CM | POA: Insufficient documentation

## 2023-11-30 DIAGNOSIS — F32A Depression, unspecified: Secondary | ICD-10-CM | POA: Diagnosis present

## 2023-11-30 DIAGNOSIS — R109 Unspecified abdominal pain: Secondary | ICD-10-CM | POA: Diagnosis not present

## 2023-11-30 DIAGNOSIS — L409 Psoriasis, unspecified: Secondary | ICD-10-CM | POA: Insufficient documentation

## 2023-11-30 DIAGNOSIS — J439 Emphysema, unspecified: Secondary | ICD-10-CM | POA: Insufficient documentation

## 2023-11-30 DIAGNOSIS — L930 Discoid lupus erythematosus: Secondary | ICD-10-CM | POA: Diagnosis not present

## 2023-11-30 DIAGNOSIS — I10 Essential (primary) hypertension: Secondary | ICD-10-CM | POA: Diagnosis not present

## 2023-11-30 DIAGNOSIS — K529 Noninfective gastroenteritis and colitis, unspecified: Secondary | ICD-10-CM | POA: Diagnosis not present

## 2023-11-30 DIAGNOSIS — I700 Atherosclerosis of aorta: Secondary | ICD-10-CM | POA: Diagnosis not present

## 2023-11-30 DIAGNOSIS — Z9049 Acquired absence of other specified parts of digestive tract: Secondary | ICD-10-CM | POA: Diagnosis not present

## 2023-11-30 DIAGNOSIS — K219 Gastro-esophageal reflux disease without esophagitis: Secondary | ICD-10-CM | POA: Diagnosis not present

## 2023-11-30 DIAGNOSIS — F410 Panic disorder [episodic paroxysmal anxiety] without agoraphobia: Secondary | ICD-10-CM

## 2023-11-30 DIAGNOSIS — L419 Parapsoriasis, unspecified: Secondary | ICD-10-CM | POA: Insufficient documentation

## 2023-11-30 LAB — CBC
HCT: 45.2 % (ref 36.0–46.0)
Hemoglobin: 15.3 g/dL — ABNORMAL HIGH (ref 12.0–15.0)
MCH: 28.9 pg (ref 26.0–34.0)
MCHC: 33.8 g/dL (ref 30.0–36.0)
MCV: 85.4 fL (ref 80.0–100.0)
Platelets: 299 K/uL (ref 150–400)
RBC: 5.29 MIL/uL — ABNORMAL HIGH (ref 3.87–5.11)
RDW: 14.1 % (ref 11.5–15.5)
WBC: 10.9 K/uL — ABNORMAL HIGH (ref 4.0–10.5)
nRBC: 0 % (ref 0.0–0.2)

## 2023-11-30 LAB — COMPREHENSIVE METABOLIC PANEL WITH GFR
ALT: 12 U/L (ref 0–44)
AST: 17 U/L (ref 15–41)
Albumin: 3.7 g/dL (ref 3.5–5.0)
Alkaline Phosphatase: 73 U/L (ref 38–126)
Anion gap: 8 (ref 5–15)
BUN: 15 mg/dL (ref 6–20)
CO2: 29 mmol/L (ref 22–32)
Calcium: 9.1 mg/dL (ref 8.9–10.3)
Chloride: 102 mmol/L (ref 98–111)
Creatinine, Ser: 0.62 mg/dL (ref 0.44–1.00)
GFR, Estimated: >60 mL/min (ref >60)
Glucose, Bld: 108 mg/dL — ABNORMAL HIGH (ref 70–99)
Potassium: 3.9 mmol/L (ref 3.5–5.1)
Sodium: 139 mmol/L (ref 135–145)
Total Bilirubin: 1.1 mg/dL (ref 0.0–1.2)
Total Protein: 7.5 g/dL (ref 6.5–8.1)

## 2023-11-30 LAB — URINALYSIS, ROUTINE W REFLEX MICROSCOPIC
Bilirubin Urine: NEGATIVE
Glucose, UA: NEGATIVE mg/dL
Hgb urine dipstick: NEGATIVE
Ketones, ur: NEGATIVE mg/dL
Leukocytes,Ua: NEGATIVE
Nitrite: NEGATIVE
Protein, ur: NEGATIVE mg/dL
Specific Gravity, Urine: 1.004 — ABNORMAL LOW (ref 1.005–1.030)
pH: 5 (ref 5.0–8.0)

## 2023-11-30 LAB — LACTIC ACID, PLASMA: Lactic Acid, Venous: 0.8 mmol/L (ref 0.5–1.9)

## 2023-11-30 LAB — LIPASE, BLOOD: Lipase: 20 U/L (ref 11–51)

## 2023-11-30 MED ORDER — ALBUTEROL SULFATE (2.5 MG/3ML) 0.083% IN NEBU: 2.5000 mg | INHALATION_SOLUTION | Freq: Four times a day (QID) | RESPIRATORY_TRACT | Status: DC | PRN

## 2023-11-30 MED ORDER — ATORVASTATIN CALCIUM 20 MG PO TABS
40.0000 mg | ORAL_TABLET | Freq: Every day | ORAL | Status: DC
  Administered 2023-12-01 – 2023-12-02 (×2): 40 mg via ORAL
  Filled 2023-11-30 (×2): qty 2

## 2023-11-30 MED ORDER — ACETAMINOPHEN 650 MG RE SUPP: 650.0000 mg | Freq: Four times a day (QID) | RECTAL | Status: DC | PRN

## 2023-11-30 MED ORDER — DIPHENHYDRAMINE HCL 50 MG/ML IJ SOLN
12.5000 mg | Freq: Once | INTRAMUSCULAR | Status: AC
Start: 2023-11-30 — End: 2023-11-30
  Administered 2023-11-30: 12.5 mg via INTRAVENOUS
  Filled 2023-11-30: qty 1

## 2023-11-30 MED ORDER — HYDROMORPHONE HCL 1 MG/ML IJ SOLN: 0.5000 mg | INTRAMUSCULAR | Status: DC | PRN

## 2023-11-30 MED ORDER — SODIUM CHLORIDE 0.9% FLUSH
3.0000 mL | Freq: Two times a day (BID) | INTRAVENOUS | Status: DC
  Administered 2023-12-02: 3 mL via INTRAVENOUS

## 2023-11-30 MED ORDER — SODIUM CHLORIDE 0.9 % IV SOLN: 12.5000 mg | Freq: Four times a day (QID) | INTRAVENOUS | Status: DC | PRN

## 2023-11-30 MED ORDER — ENOXAPARIN SODIUM 40 MG/0.4ML IJ SOSY
40.0000 mg | PREFILLED_SYRINGE | INTRAMUSCULAR | Status: DC
  Administered 2023-11-30 – 2023-12-01 (×2): 40 mg via SUBCUTANEOUS
  Filled 2023-11-30 (×2): qty 0.4

## 2023-11-30 MED ORDER — ACETAMINOPHEN 325 MG PO TABS: 650.0000 mg | ORAL_TABLET | Freq: Four times a day (QID) | ORAL | Status: DC | PRN

## 2023-11-30 MED ORDER — IOHEXOL 300 MG/ML  SOLN
100.0000 mL | Freq: Once | INTRAMUSCULAR | Status: AC | PRN
  Administered 2023-11-30: 100 mL via INTRAVENOUS

## 2023-11-30 MED ORDER — ONDANSETRON HCL 4 MG PO TABS
4.0000 mg | ORAL_TABLET | Freq: Four times a day (QID) | ORAL | Status: DC | PRN
  Administered 2023-12-01 – 2023-12-02 (×2): 4 mg via ORAL
  Filled 2023-11-30 (×2): qty 1

## 2023-11-30 MED ORDER — HYDROCODONE-ACETAMINOPHEN 5-325 MG PO TABS
1.0000 | ORAL_TABLET | Freq: Four times a day (QID) | ORAL | Status: DC | PRN
  Administered 2023-12-01 – 2023-12-02 (×2): 1 via ORAL
  Filled 2023-11-30 (×2): qty 1

## 2023-11-30 MED ORDER — MORPHINE SULFATE (PF) 4 MG/ML IV SOLN
4.0000 mg | Freq: Once | INTRAVENOUS | Status: AC
  Administered 2023-11-30: 4 mg via INTRAVENOUS
  Filled 2023-11-30: qty 1

## 2023-11-30 MED ORDER — CLONAZEPAM 1 MG PO TABS
1.0000 mg | ORAL_TABLET | Freq: Two times a day (BID) | ORAL | Status: DC | PRN
  Administered 2023-11-30 – 2023-12-02 (×4): 1 mg via ORAL
  Filled 2023-11-30 (×4): qty 1

## 2023-11-30 MED ORDER — ONDANSETRON HCL 4 MG/2ML IJ SOLN
4.0000 mg | Freq: Four times a day (QID) | INTRAMUSCULAR | Status: DC | PRN
  Administered 2023-12-01: 4 mg via INTRAVENOUS
  Filled 2023-11-30: qty 2

## 2023-11-30 MED ORDER — ACETAMINOPHEN 500 MG PO TABS
1000.0000 mg | ORAL_TABLET | Freq: Once | ORAL | Status: AC
  Administered 2023-11-30: 1000 mg via ORAL
  Filled 2023-11-30: qty 2

## 2023-11-30 MED ORDER — SODIUM CHLORIDE 0.9 % IV SOLN: INTRAVENOUS | Status: AC

## 2023-11-30 MED ORDER — MORPHINE SULFATE (PF) 2 MG/ML IV SOLN
2.0000 mg | Freq: Once | INTRAVENOUS | Status: AC
  Administered 2023-11-30: 2 mg via INTRAVENOUS
  Filled 2023-11-30: qty 1

## 2023-11-30 MED ORDER — LOPERAMIDE HCL 2 MG PO CAPS: 2.0000 mg | ORAL_CAPSULE | ORAL | Status: DC | PRN

## 2023-11-30 MED ORDER — BUDESON-GLYCOPYRROL-FORMOTEROL 160-9-4.8 MCG/ACT IN AERO
2.0000 | INHALATION_SPRAY | Freq: Two times a day (BID) | RESPIRATORY_TRACT | Status: DC
  Administered 2023-11-30 – 2023-12-02 (×4): 2 via RESPIRATORY_TRACT
  Filled 2023-11-30: qty 5.9

## 2023-11-30 MED ORDER — LORAZEPAM 0.5 MG PO TABS
0.5000 mg | ORAL_TABLET | Freq: Once | ORAL | Status: AC
  Administered 2023-11-30: 0.5 mg via ORAL
  Filled 2023-11-30: qty 1

## 2023-11-30 MED ORDER — PANTOPRAZOLE SODIUM 40 MG IV SOLR
40.0000 mg | Freq: Once | INTRAVENOUS | Status: AC
Start: 2023-11-30 — End: 2023-11-30
  Administered 2023-11-30: 40 mg via INTRAVENOUS
  Filled 2023-11-30: qty 10

## 2023-11-30 MED ORDER — ONDANSETRON HCL 4 MG/2ML IJ SOLN
4.0000 mg | Freq: Once | INTRAMUSCULAR | Status: AC
  Administered 2023-11-30: 4 mg via INTRAVENOUS
  Filled 2023-11-30: qty 2

## 2023-11-30 MED ORDER — HYDROXYCHLOROQUINE SULFATE 200 MG PO TABS
400.0000 mg | ORAL_TABLET | Freq: Every day | ORAL | Status: DC
  Administered 2023-12-01 – 2023-12-02 (×2): 400 mg via ORAL
  Filled 2023-11-30 (×2): qty 2

## 2023-11-30 MED ORDER — DICYCLOMINE HCL 10 MG PO CAPS
10.0000 mg | ORAL_CAPSULE | Freq: Once | ORAL | Status: AC
  Administered 2023-11-30: 10 mg via ORAL
  Filled 2023-11-30: qty 1

## 2023-11-30 MED ORDER — ONDANSETRON HCL 4 MG/2ML IJ SOLN
4.0000 mg | INTRAMUSCULAR | Status: AC
  Administered 2023-11-30: 4 mg via INTRAVENOUS
  Filled 2023-11-30: qty 2

## 2023-11-30 MED ORDER — SODIUM CHLORIDE 0.9 % IV BOLUS
500.0000 mL | Freq: Once | INTRAVENOUS | Status: AC
  Administered 2023-11-30: 500 mL via INTRAVENOUS

## 2023-11-30 MED ORDER — BUDESON-GLYCOPYRROL-FORMOTEROL 160-9-4.8 MCG/ACT IN AERO
2.0000 | INHALATION_SPRAY | Freq: Two times a day (BID) | RESPIRATORY_TRACT | Status: DC
  Filled 2023-11-30: qty 5.9

## 2023-11-30 MED ORDER — PANTOPRAZOLE SODIUM 40 MG PO TBEC
40.0000 mg | DELAYED_RELEASE_TABLET | Freq: Every day | ORAL | Status: DC
Start: 2023-12-01 — End: 2023-12-02
  Administered 2023-12-01 – 2023-12-02 (×2): 40 mg via ORAL
  Filled 2023-11-30 (×2): qty 1

## 2023-11-30 NOTE — ED Notes (Signed)
 CT called to notify RN 30 mins before scan for benadryl  administration

## 2023-11-30 NOTE — H&P (Signed)
 History and Physical   Andrea [name] FMW:[id number] DOB: 1966-02-11 DOA: 11/30/2023  PCP: [name], MD   Patient coming from: Home  Chief Complaint: Diarrhea, abdominal pain  HPI: Andrea [name] is a 58 y.o. female with medical history significant of hypertension, hyperlipidemia, GERD status post Nissen fundoplication, restless leg syndrome, pain disorder, depression, COPD, psoriasis, discoid lupus, fibromyalgia presenting with diarrhea and abdominal pain  Patient presenting with 3 days of progressive diarrhea and abdominal pain.  Reports up to 18 episodes of diarrhea per day with cramping pain.  Pain is worse with any activity.  Diarrhea is nonbloody.  Reports nausea without vomiting.  Her last antibiotic use was a month or so ago when she required 3 different antibiotics to treat a sinus infection.  Denies fevers, chills, chest pain, shortness of breath.  ED Course: All signs in the ED stable.  Lab workup included CMP with glucose 1 8.  CBC with leukocytosis to 10.9, hemoglobin mildly elevated at 15.2.  Lactic acid pending.  Lipase normal.  Urinalysis normal.  GI pathogen panel and C. difficile panel pending.  CT abdomen pelvis without contrast initially showed moderate ascites that was nonspecific with scattered loops of small bowel with decompression distally without transition point.  Nonspecific and recommendation for follow-up CT with contrast.  CT with contrast was performed which showed small bowel wall thickening and mesenteric edema which was nonspecific but suggestive of enteritis.  Again moderate ascites was redemonstrated without pneumoperitoneum.  Patient received Tylenol , morphine , Ativan, Benadryl , Zofran , Bentyl, PPI, 1 L IV fluids in the ED.  Patient requested due to severity of diarrhea and degree of inflammation/ascites on imaging while awaiting results of GI panel, and monitoring for further pain medication needs in the setting of her abdominal  pain..  Review of Systems: As per HPI otherwise all other systems reviewed and are negative.  Past Medical History:  Diagnosis Date   Anxiety    panic attacks, chest pain   Arthritis    COPD (chronic obstructive pulmonary disease) (HCC)    COVID    2023   Dyspnea    GERD (gastroesophageal reflux disease)    History of hiatal hernia    Hypertension    not medicated   Lung nodule, multiple 06/10/2013   per records from Dr. Alaine is to repeat Ct in 1 year (April, 2016)     >>OVERVIEW FOR PULMONARY NODULE WRITTEN ON 07/08/2014  7:19 AM BY MCQUAID, VICENTA B, MD     06/2013 CT high res chest> mild centrilobular emphysema but no ILD, multiple scattered pulm nodules all 4mm in size  07/2014 CT chest > nodule unchanged     Non-obstructive CAD in native artery    a. cardiac cath 01/2014: showed minor irregularities, mildly elevated left ventricular end-diastolic pressure and normal ejection fraction; b. 03/2015 MV: low risk.   Palpitations    a. 08/2016 Event Monitor: occas PVC's, two brief runs of SVT (4 and 7 beats).   Panic attacks 02/11/2012   PONV (postoperative nausea and vomiting)    PONV (postoperative nausea and vomiting)    Stage 2 moderate COPD by GOLD classification (HCC)    Symptomatic PVC's (premature ventricular contractions)    Tobacco abuse    Wears dentures    partial upper and lower    Past Surgical History:  Procedure Laterality Date   CARDIAC CATHETERIZATION  01/03/2014   CARDIAC CATHETERIZATION     CATARACT EXTRACTION W/PHACO Left 11/27/2022   Procedure: CATARACT EXTRACTION  PHACO AND INTRAOCULAR LENS PLACEMENT (IOC) LEFT MALYUGIN  3.59  00:31.6;  Surgeon: Mittie Gaskin, MD;  Location: [facility];  Service: Ophthalmology;  Laterality: Left;   CHOLECYSTECTOMY     COLONOSCOPY WITH PROPOFOL  N/A 10/05/2019   Procedure: COLONOSCOPY WITH PROPOFOL ;  Surgeon: Jinny Carmine, MD;  Location: [facility];  Service: Endoscopy;  Laterality: N/A;    ESOPHAGOGASTRODUODENOSCOPY (EGD) WITH PROPOFOL  N/A 07/11/2016   Procedure: ESOPHAGOGASTRODUODENOSCOPY (EGD) WITH PROPOFOL ;  Surgeon: Jinny Carmine, MD;  Location: [facility];  Service: Endoscopy;  Laterality: N/A;   ESOPHAGOGASTRODUODENOSCOPY (EGD) WITH PROPOFOL  N/A 10/05/2019   Procedure: ESOPHAGOGASTRODUODENOSCOPY (EGD) WITH PROPOFOL ;  Surgeon: Jinny Carmine, MD;  Location: [facility];  Service: Endoscopy;  Laterality: N/A;   ESOPHAGOGASTRODUODENOSCOPY (EGD) WITH PROPOFOL  N/A 02/21/2022   Procedure: ESOPHAGOGASTRODUODENOSCOPY (EGD) WITH PROPOFOL ;  Surgeon: Jinny Carmine, MD;  Location: [facility];  Service: Endoscopy;  Laterality: N/A;   EYE SURGERY     IMAGE GUIDED SINUS SURGERY N/A 11/30/2014   Procedure: IMAGE GUIDED SINUS SURGERY;  Surgeon: Carolee Hunter, MD;  Location: [facility];  Service: ENT;  Laterality: N/A;  GAVE DISK TO CE CE   INSERTION OF MESH  03/28/2022   Procedure: INSERTION OF MESH;  Surgeon: Jordis Laneta FALCON, MD;  Location: [facility];  Service: General;;   MAXILLARY ANTROSTOMY Bilateral 11/30/2014   Procedure: MAXILLARY ANTROSTOMY;  Surgeon: Carolee Hunter, MD;  Location: [facility];  Service: ENT;  Laterality: Bilateral;   SEPTOPLASTY N/A 11/30/2014   Procedure: SEPTOPLASTY;  Surgeon: Carolee Hunter, MD;  Location: [facility];  Service: ENT;  Laterality: N/A;   SPHENOIDECTOMY Left 11/30/2014   Procedure: CLEONE, ;  Surgeon: Carolee Hunter, MD;  Location: [facility];  Service: ENT;  Laterality: Left;   VAGINAL HYSTERECTOMY  1995   vaginal   XI ROBOTIC ASSISTED PARAESOPHAGEAL HERNIA REPAIR N/A 03/28/2022   Procedure: XI ROBOTIC ASSISTED PARAESOPHAGEAL HERNIA REPAIR, RNFA to assist;  Surgeon: Jordis Laneta FALCON, MD;  Location: [facility];  Service: General;  Laterality: N/A;    Social History  reports that she has been smoking cigarettes. She has a 20 pack-year smoking history. She has been exposed to tobacco smoke.  She has never used smokeless tobacco. She reports that she does not drink alcohol and does not use drugs.  Allergies  Allergen Reactions   Alum & Mag Hydroxide-Simeth Diarrhea and Nausea Only   Bactrim  [Sulfamethoxazole -Trimethoprim ] Itching   Cefdinir Other (See Comments)    tachycardia   Chantix [Varenicline]     Bad dreams   Chlorhexidine  Gluconate Swelling   Doxycycline  Other (See Comments)    Nausea, migraine   Fentanyl  Nausea And Vomiting   Multaq  [Dronedarone ] Other (See Comments)    Lip/ mouth numbness/ tongue swelling    Family History  Problem Relation Age of Onset   Emphysema Father    Asthma Father    Heart disease Father    Heart attack Father    Arthritis Father        RA   Colon cancer Father    Hypertension Mother    Breast cancer Neg Hx   Reviewed on admission  Prior to Admission medications   Medication Sig Start Date End Date Taking? Authorizing Provider  albuterol  (PROVENTIL ) (2.5 MG/3ML) 0.083% nebulizer solution USE 1 VIAL IN NEBULIZER EVERY 4 (FOUR) HOURS AS NEEDED FOR WHEEZING OR SHORTNESS OF BREATH. 11/28/20   Isaiah Scrivener, MD  albuterol  (VENTOLIN  HFA) 108 (90 Base) MCG/ACT inhaler Inhale 2 puffs into the lungs  every 6 (six) hours as needed for wheezing or shortness of breath. 04/30/23   Tamea Dedra CROME, MD  atorvastatin  (LIPITOR) 40 MG tablet TAKE 1 TABLET BY MOUTH DAILY 08/21/23   [name], MD  budesonide -glycopyrrolate -formoterol  (BREZTRI  AEROSPHERE) 160-9-4.8 MCG/ACT AERO inhaler Inhale 2 puffs into the lungs in the morning and at bedtime. 09/02/23   Tamea Dedra CROME, MD  clonazePAM  (KLONOPIN ) 1 MG tablet Take 1 tablet (1 mg total) by mouth 2 (two) times daily as needed. TAKES EVERY DAY 10/11/23   [name], MD  dexlansoprazole  (DEXILANT ) 60 MG capsule TAKE 1 CAPSULE BY MOUTH EVERY DAY 10/08/23   Jinny Carmine, MD  fluticasone  (FLONASE ) 50 MCG/ACT nasal spray Place 2 sprays into both nostrils as needed. 07/25/23   [name], MD   hydroxychloroquine  (PLAQUENIL ) 200 MG tablet TAKE 2 TABLETS BY MOUTH DAILY 06/20/23   [name], MD  Misc. Devices (PULSE OXIMETER DELUXE) MISC Use daily to monitor oxygen  levels 01/07/20   Burnette, Jennifer M, PA-C  Nebulizers (COMPRESSOR/NEBULIZER) MISC Supplies only 11/28/20   Kasa, Kurian, MD  Nebulizers (COMPRESSOR/NEBULIZER) MISC Use as directed 12/01/20   Kasa, Kurian, MD  nitroGLYCERIN  (NITROSTAT ) 0.4 MG SL tablet Place 1 tablet (0.4 mg total) under the tongue every 5 (five) minutes as needed for chest pain. 12/08/14   Darron Deatrice LABOR, MD  OXYGEN  Inhale 2 L into the lungs as needed (at while sleeping).    [provider]  prednisoLONE  sodium phosphate  (INFLAMASE FORTE ) 1 % ophthalmic solution Place 1 drop into the left eye as needed. 01/02/23   [provider]  Respiratory Therapy Supplies (FLUTTER) DEVI 1 Device by Does not apply route daily. 03/19/15   Vaickute, Rima, MD  Spacer/Aero-Holding Chambers (AEROCHAMBER MV) inhaler Use as instructed 09/10/23   Tamea Dedra CROME, MD    Physical Exam: Vitals:   11/30/23 1430 11/30/23 1500 11/30/23 1615 11/30/23 1654  BP: 123/75 110/67 111/64   Pulse: 67 71 71   Resp:   16   Temp:    97.8 F (36.6 C)  TempSrc:      SpO2: 98% 99% 97%   Weight:      Height:        Physical Exam Constitutional:      General: She is not in acute distress.    Appearance: Normal appearance.  HENT:     Head: Normocephalic and atraumatic.     Mouth/Throat:     Mouth: Mucous membranes are moist.     Pharynx: Oropharynx is clear.  Eyes:     Extraocular Movements: Extraocular movements intact.     Pupils: Pupils are equal, round, and reactive to light.  Cardiovascular:     Rate and Rhythm: Normal rate and regular rhythm.     Pulses: Normal pulses.     Heart sounds: Normal heart sounds.  Pulmonary:     Effort: Pulmonary effort is normal. No respiratory distress.     Breath sounds: Normal breath sounds.  Abdominal:     General:  Bowel sounds are normal. There is distension (mild).     Palpations: Abdomen is soft.     Tenderness: There is abdominal tenderness.  Musculoskeletal:        General: No swelling or deformity.  Skin:    General: Skin is warm and dry.  Neurological:     General: No focal deficit present.     Mental Status: Mental status is at baseline.    Labs on Admission: I  have personally reviewed following labs and imaging studies  CBC: Recent Labs  Lab 11/30/23 0836  WBC 10.9*  HGB 15.3*  HCT 45.2  MCV 85.4  PLT 299    Basic Metabolic Panel: Recent Labs  Lab 11/30/23 0836  NA 139  K 3.9  CL 102  CO2 29  GLUCOSE 108*  BUN 15  CREATININE 0.62  CALCIUM  9.1    GFR: Estimated Creatinine Clearance: 67.3 mL/min (by C-G formula based on SCr of 0.62 mg/dL).  Liver Function Tests: Recent Labs  Lab 11/30/23 0836  AST 17  ALT 12  ALKPHOS 73  BILITOT 1.1  PROT 7.5  ALBUMIN  3.7    Urine analysis:    Component Value Date/Time   COLORURINE STRAW (A) 11/30/2023 1217   APPEARANCEUR CLEAR (A) 11/30/2023 1217   APPEARANCEUR Clear 01/17/2014 2055   LABSPEC 1.004 (L) 11/30/2023 1217   LABSPEC 1.023 01/17/2014 2055   PHURINE 5.0 11/30/2023 1217   GLUCOSEU NEGATIVE 11/30/2023 1217   GLUCOSEU Negative 01/17/2014 2055   HGBUR NEGATIVE 11/30/2023 1217   BILIRUBINUR NEGATIVE 11/30/2023 1217   BILIRUBINUR Negative 06/04/2019 1604   BILIRUBINUR Negative 01/17/2014 2055   KETONESUR NEGATIVE 11/30/2023 1217   PROTEINUR NEGATIVE 11/30/2023 1217   UROBILINOGEN 0.2 06/04/2019 1604   NITRITE NEGATIVE 11/30/2023 1217   LEUKOCYTESUR NEGATIVE 11/30/2023 1217   LEUKOCYTESUR Trace 01/17/2014 2055    Radiological Exams on Admission: CT ABDOMEN PELVIS W CONTRAST Result Date: 11/30/2023 CLINICAL DATA:  Abdominal pain, acute, nonlocalized Lowered saddle pain with new onset ascites that is unexplained. No history of cirrhosis. Unexplained weight loss EXAM: CT ABDOMEN AND PELVIS WITH CONTRAST  TECHNIQUE: Multidetector CT imaging of the abdomen and pelvis was performed using the standard protocol following bolus administration of intravenous contrast. RADIATION DOSE REDUCTION: This exam was performed according to the departmental dose-optimization program which includes automated exposure control, adjustment of the mA and/or kV according to patient size and/or use of iterative reconstruction technique. CONTRAST:  100mL OMNIPAQUE IOHEXOL 300 MG/ML  SOLN COMPARISON:  Noncontrast abdominopelvic CT performed earlier the same date and 12/17/2022. FINDINGS: Lower chest: Clear lung bases. No significant pleural or pericardial effusion. Hepatobiliary: The liver is normal in density without suspicious focal abnormality. There are scattered tiny low-density lesions which are likely cysts or biliary hamartomas. Status post cholecystectomy with minimal intrahepatic biliary prominence. No evidence of choledocholithiasis. Pancreas: Unremarkable. No pancreatic ductal dilatation or surrounding inflammatory changes. Spleen: Normal in size without focal abnormality. Adrenals/Urinary Tract: Both adrenal glands appear normal. No evidence of urinary tract calculus, suspicious renal lesion or hydronephrosis. Unchanged 1.0 cm angiomyolipoma in the lower pole of the left kidney, again not requiring any specific imaging follow-up. The bladder appears unremarkable for its degree of distention. Stomach/Bowel: No enteric contrast administered. The stomach remains unremarkable for its degree of distention. Previously demonstrated small bowel distention appears decreased. However, there is mild small bowel wall thickening and mesenteric edema in the mid abdomen. The appendix appears normal. Interval improved distension of the colon which demonstrates no wall thickening or surrounding inflammation. Mild distal colonic diverticulosis again noted without evidence of acute inflammation. Vascular/Lymphatic: There are no enlarged abdominal  or pelvic lymph nodes. Aortic and branch vessel atherosclerosis without evidence of aneurysm or large vessel occlusion. The portal, superior mesenteric and splenic veins are patent. Reproductive: Status post hysterectomy.  No adnexal mass. Other: As shown on earlier noncontrast study, there is a moderate amount of low-density free pelvic fluid. This demonstrates no high-density components to suggest hemorrhage.  There is mild mesenteric edema. No focal extraluminal fluid collection or pneumoperitoneum. Musculoskeletal: No acute or significant osseous findings. IMPRESSION: 1. Repeat study with intravenous but no enteric contrast demonstrates mild small bowel wall thickening and mesenteric edema without significant residual bowel distension. Findings are nonspecific, but suggestive of enteritis. Correlate clinically. 2. Moderate amount of low-density free pelvic fluid again noted, nonspecific. No focal fluid collection or pneumoperitoneum. 3. Normal appendix. 4. No acute vascular findings. 5.  Aortic Atherosclerosis (ICD10-I70.0). Electronically Signed   By: Elsie Perone M.D.   On: 11/30/2023 16:27   CT ABDOMEN PELVIS WO CONTRAST Result Date: 11/30/2023 CLINICAL DATA:  Abdominal pain, acute, nonlocalized 1 year ago Nissen fundoplication, prior cholecystectomy Diarrhea and right-sided abdominal pain for 3 days. EXAM: CT ABDOMEN AND PELVIS WITHOUT CONTRAST TECHNIQUE: Multidetector CT imaging of the abdomen and pelvis was performed following the standard protocol without IV contrast. RADIATION DOSE REDUCTION: This exam was performed according to the departmental dose-optimization program which includes automated exposure control, adjustment of the mA and/or kV according to patient size and/or use of iterative reconstruction technique. COMPARISON:  Abdominopelvic CT 12/17/2022. FINDINGS: Lower chest: Clear lung bases. No significant pleural or pericardial effusion. Aortic atherosclerosis noted. Hepatobiliary: The  liver has a non cirrhotic morphology without suspicious focal abnormality on noncontrast imaging. Small low-density lesion in the left lobe on image 27/2 is unchanged, likely a cyst. Previous cholecystectomy without evidence of biliary dilatation. Pancreas: Unremarkable. No pancreatic ductal dilatation or surrounding inflammatory changes. Spleen: Normal in size without focal abnormality. Adrenals/Urinary Tract: Both adrenal glands appear normal. No evidence of urinary tract calculus, suspicious renal lesion or hydronephrosis. There is a stable 1.0 cm angiomyolipoma in the lower pole of the left kidney on image 29/2 for which no specific follow-up imaging is recommended. The bladder appears unremarkable for its degree of distention. Stomach/Bowel: No enteric contrast administered. The stomach appears unremarkable for its degree of distention. The proximal small bowel appears unremarkable. There are scattered loops of mildly dilated and fluid-filled mid small bowel with decompression of the terminal ileum. No focal transition point identified. The colon is decompressed with possible mild diffuse wall thickening. There are diverticular changes in the sigmoid colon without focal surrounding inflammation. The appendix appears normal. Vascular/Lymphatic: There are no enlarged abdominal or pelvic lymph nodes. Diffuse aortic and branch vessel atherosclerosis without evidence of aneurysm. Reproductive: Previous hysterectomy.  No evidence of adnexal mass. Other: New moderate volume of low-density pelvic ascites. No focal fluid collection, peritoneal nodularity or pneumoperitoneum. Musculoskeletal: No acute or significant osseous findings. IMPRESSION: 1. New moderate volume of low-density pelvic ascites, nonspecific. No focal fluid collection, peritoneal nodularity or pneumoperitoneum. 2. Scattered loops of mildly dilated and fluid-filled mid small bowel with decompression of the terminal ileum. No focal transition point  identified. Findings are nonspecific, but could reflect a mild enteritis or early/partial bowel obstruction. Follow-up imaging with intravenous and enteric contrast may be helpful. 3. The colon is decompressed with possible mild diffuse wall thickening. Sigmoid diverticulosis without evidence of acute inflammation. 4.  Aortic Atherosclerosis (ICD10-I70.0). Electronically Signed   By: Elsie Perone M.D.   On: 11/30/2023 11:24   EKG: Not performed in the emergency department  Assessment/Plan Principal Problem:   Enteritis of small bowel Active Problems:   COPD, GOLD B   Depression   Discoid lupus   Essential (primary) hypertension   Fibromyalgia   GERD (gastroesophageal reflux disease)   Panic disorder   Hyperlipidemia   Psoriasis   Restless leg  Enteritis > Patient presenting with abdominal pain and diarrhea, worsening for the past 3 days. > Patient has had nausea without significant vomiting so has been able to keep down p.o. > Workup in the ED notable for initial nonspecific CT abdomen pelvis without contrast showing moderate ascites and changes possibly representing early small bowel obstruction with recommendation for CT abdomen pelvis with contrast which was performed and further clarified changes consistent with small bowel wall thickening and mesenteric edema likely enteritis.  Moderate ascites was again redemonstrated. > Patient did have leukocytosis to 10.9, which appear to be stable.  Lactic acid still pending.  GI pathogen panel and C. difficile panel pending. > Observation requested due to severity of inflammation and ascites on CT with IV pain medication requirement in the ED and progressive symptoms over the preceding 3 days. - Monitor on MedSurg overnight - Trend fever curve and WBC, expect leukocytosis to improve with fluids - Follow-up GI pathogen panel and C. difficile panel - Follow-up lactic acid to rule out any ischemia (anticipate this being normal with normal  bicarb and gap) - Continue as needed pain medication - IV fluids overnight - Supportive care  Hypertension - Not currently on medication for this  Hyperlipidemia - Continue atorvastatin   GERD Paraesophageal hernia > Status post robotic assisted laparoscopic repair of paraesophageal hernia with Bio-A Mesh and Nissen fundoplication. - Continue PPI  Pain disorder Depression - Continue home clonazepam   COPD - Continue home Breztri  and as needed albuterol   Psoriasis Discoid lupus - Continue home hydroxychloroquine   Restless legs Fibromyalgia - Noted  DVT prophylaxis: Lovenox  Code Status:   Full Family Communication:  Updated at bedside  Disposition Plan:   Patient is from:  Home  Anticipated DC to:  Home  Anticipated DC date:  1 to 2 days  Anticipated DC barriers: None  Consults called:  None Admission status:  Verging on MedSurg  Severity of Illness: The appropriate patient status for this patient is OBSERVATION. Observation status is judged to be reasonable and necessary in order to provide the required intensity of service to ensure the patient's safety. The patient's presenting symptoms, physical exam findings, and initial radiographic and laboratory data in the context of their medical condition is felt to place them at decreased risk for further clinical deterioration. Furthermore, it is anticipated that the patient will be medically stable for discharge from the hospital within 2 midnights of admission.    Marsa KATHEE Scurry MD [facility]  How to contact the [facility] Attending or Consulting provider 7A - 7P or covering provider during after hours 7P -7A, for this patient?   Check the care team in [facility] and look for a) attending/consulting [facility] provider listed and b) the [facility] team listed Log into [email or web address] and use [facility] universal password to access. If you do not have the password, please contact the hospital operator. Locate the [facility] provider you are  looking for under [facility] and page to a number that you can be directly reached. If you still have difficulty reaching the provider, please page the [facility] (Director on Call) for the Hospitalists listed on amion for assistance.  11/30/2023, 5:25 PM

## 2023-11-30 NOTE — ED Provider Notes (Signed)
 [facility] Provider Note    Event Date/Time   First MD Initiated Contact with Patient 11/30/23 [phone]     (approximate)   History   Diarrhea   HPI  Andrea [name] is a 58 y.o. female past medical history significant for COPD, presents to the emergency department with diarrhea and abdominal pain.  States that since Thursday she has been having multiple episodes of diarrhea and abdominal cramping.  Cramping is worse to her upper abdomen.  Associated with nausea but no episodes of vomiting.  Denies any blood in her stool.  No recent antibiotic use.  Prior Niesen fundoplication 1 year ago and prior cholecystectomy.  Denies dysuria, urinary urgency or frequency.  Denies fever.  No recent travel.  No known sick contacts.  Denies significant cough.  Weight loss of approximately 8 pounds.  Recent CT scan of chest.     Physical Exam   Triage Vital Signs: ED Triage Vitals  Encounter Vitals Group     BP 11/30/23 0835 136/89     Girls Systolic BP Percentile --      Girls Diastolic BP Percentile --      Boys Systolic BP Percentile --      Boys Diastolic BP Percentile --      Pulse Rate 11/30/23 0835 81     Resp 11/30/23 0835 17     Temp 11/30/23 0835 97.8 F (36.6 C)     Temp Source 11/30/23 0835 Oral     SpO2 11/30/23 0835 95 %     Weight 11/30/23 0834 127 lb 13.9 oz (58 kg)     Height 11/30/23 0834 5' 5 (1.651 m)     Head Circumference --      Peak Flow --      Pain Score 11/30/23 0834 9     Pain Loc --      Pain Education --      Exclude from Growth Chart --     Most recent vital signs: Vitals:   11/30/23 1206 11/30/23 1230  BP: 109/61 120/87  Pulse: 72 73  Resp:  20  Temp:  98 F (36.7 C)  SpO2: 98% 99%    Physical Exam Constitutional:      Appearance: She is well-developed.  HENT:     Head: Atraumatic.  Eyes:     Conjunctiva/sclera: Conjunctivae normal.  Cardiovascular:     Rate and Rhythm: Regular rhythm.  Pulmonary:     Effort: No  respiratory distress.  Abdominal:     General: There is no distension.     Tenderness: There is abdominal tenderness (Bilateral upper abdominal tenderness to palpation with no rebound or guarding).  Musculoskeletal:        General: Normal range of motion.     Cervical back: Normal range of motion.  Skin:    General: Skin is warm.     Capillary Refill: Capillary refill takes less than 2 seconds.  Neurological:     Mental Status: She is alert. Mental status is at baseline.     IMPRESSION / MDM / ASSESSMENT AND PLAN / ED COURSE  I reviewed the triage vital signs and the nursing notes.  On arrival afebrile, hemodynamically stable  Differential diagnosis including colitis, viral gastroenteritis, diverticulitis, intra-abdominal abscess, dehydration, electrolyte abnormality, gastritis/PUD   No tachycardic or bradycardic dysrhythmias while on cardiac telemetry.  RADIOLOGY I independently reviewed imaging, my interpretation of imaging: CT scan abdomen and pelvis without contrast given that the patient  has a contrast allergy   CT scan abdomen and pelvis with findings of scattered dilated bowel loops concerning for nonspecific possible mild enteritis or a early or partial bowel obstruction.  New moderate volume ascites that was nonspecific with no focal fluid collection.  LABS (all labs ordered are listed, but only abnormal results are displayed) Labs interpreted as -    Labs Reviewed  COMPREHENSIVE METABOLIC PANEL WITH GFR - Abnormal; Notable for the following components:      Result Value   Glucose, Bld 108 (*)    All other components within normal limits  CBC - Abnormal; Notable for the following components:   WBC 10.9 (*)    RBC 5.29 (*)    Hemoglobin 15.3 (*)    All other components within normal limits  URINALYSIS, ROUTINE W REFLEX MICROSCOPIC - Abnormal; Notable for the following components:   Color, Urine STRAW (*)    APPearance CLEAR (*)    Specific Gravity, Urine 1.004  (*)    All other components within normal limits  LIPASE, BLOOD     MDM  Mild leukocytosis.  Hemoglobin appears to be elevated at 15.3 with a baseline of 14.  Creatinine at baseline with no significant electrolyte abnormality.  Mild elevation of T. bili at 1.1.  Given Tylenol  and antiemetics given IV Protonix  and IV fluids  Ongoing pain given IV morphine  for pain control  Discussed with radiology who read the patient CT scan given questionable new pelvic ascites that was nondescript.  Felt that it would be improved if she had a CT scan with contrast dye.  After further discussion with the patient only had some heart palpitations and felt like her heart was racing for approximately 15 seconds with IV contrast.  Do not feel that this is a true allergy .  Given a dose of Benadryl  do not feel that she needs a 4-hour pretreatment.  Ordered CT scan abdomen and pelvis with contrast for further evaluation.     PROCEDURES:  Critical Care performed: No  Procedures  Patient's presentation is most consistent with acute presentation with potential threat to life or bodily function.   MEDICATIONS ORDERED IN ED: Medications  diphenhydrAMINE  (BENADRYL ) injection 12.5 mg (has no administration in time range)  dicyclomine (BENTYL) capsule 10 mg (10 mg Oral Given 11/30/23 0916)  ondansetron  (ZOFRAN ) injection 4 mg (4 mg Intravenous Given 11/30/23 9077)  acetaminophen  (TYLENOL ) tablet 1,000 mg (1,000 mg Oral Given 11/30/23 0916)  sodium chloride  0.9 % bolus 500 mL (0 mLs Intravenous Stopped 11/30/23 1208)  pantoprazole  (PROTONIX ) injection 40 mg (40 mg Intravenous Given 11/30/23 0945)  morphine  (PF) 4 MG/ML injection 4 mg (4 mg Intravenous Given 11/30/23 1219)    FINAL CLINICAL IMPRESSION(S) / ED DIAGNOSES   Final diagnoses:  Diarrhea, unspecified type  Generalized abdominal pain     Rx / DC Orders   ED Discharge Orders     None        Note:  This document was prepared using Dragon voice  recognition software and may include unintentional dictation errors.   Suzanne Kirsch, MD 11/30/23 1319

## 2023-11-30 NOTE — ED Notes (Signed)
 Advised nurse that patient has ready bed

## 2023-11-30 NOTE — ED Notes (Signed)
 Pt requesting anxiety meds before CT, Dr Suzanne notified .

## 2023-11-30 NOTE — ED Notes (Signed)
 See triage note, pt reports diarrhea started on Thursday. +lower abd pain. Denies fever, emesis. NAD noted.

## 2023-11-30 NOTE — ED Provider Notes (Signed)
 CT ABDOMEN PELVIS W CONTRAST Result Date: 11/30/2023 CLINICAL DATA:  Abdominal pain, acute, nonlocalized Lowered saddle pain with new onset ascites that is unexplained. No history of cirrhosis. Unexplained weight loss EXAM: CT ABDOMEN AND PELVIS WITH CONTRAST TECHNIQUE: Multidetector CT imaging of the abdomen and pelvis was performed using the standard protocol following bolus administration of intravenous contrast. RADIATION DOSE REDUCTION: This exam was performed according to the departmental dose-optimization program which includes automated exposure control, adjustment of the mA and/or kV according to patient size and/or use of iterative reconstruction technique. CONTRAST:  100mL OMNIPAQUE IOHEXOL 300 MG/ML  SOLN COMPARISON:  Noncontrast abdominopelvic CT performed earlier the same date and 12/17/2022. FINDINGS: Lower chest: Clear lung bases. No significant pleural or pericardial effusion. Hepatobiliary: The liver is normal in density without suspicious focal abnormality. There are scattered tiny low-density lesions which are likely cysts or biliary hamartomas. Status post cholecystectomy with minimal intrahepatic biliary prominence. No evidence of choledocholithiasis. Pancreas: Unremarkable. No pancreatic ductal dilatation or surrounding inflammatory changes. Spleen: Normal in size without focal abnormality. Adrenals/Urinary Tract: Both adrenal glands appear normal. No evidence of urinary tract calculus, suspicious renal lesion or hydronephrosis. Unchanged 1.0 cm angiomyolipoma in the lower pole of the left kidney, again not requiring any specific imaging follow-up. The bladder appears unremarkable for its degree of distention. Stomach/Bowel: No enteric contrast administered. The stomach remains unremarkable for its degree of distention. Previously demonstrated small bowel distention appears decreased. However, there is mild small bowel wall thickening and mesenteric edema in the mid abdomen. The appendix  appears normal. Interval improved distension of the colon which demonstrates no wall thickening or surrounding inflammation. Mild distal colonic diverticulosis again noted without evidence of acute inflammation. Vascular/Lymphatic: There are no enlarged abdominal or pelvic lymph nodes. Aortic and branch vessel atherosclerosis without evidence of aneurysm or large vessel occlusion. The portal, superior mesenteric and splenic veins are patent. Reproductive: Status post hysterectomy.  No adnexal mass. Other: As shown on earlier noncontrast study, there is a moderate amount of low-density free pelvic fluid. This demonstrates no high-density components to suggest hemorrhage. There is mild mesenteric edema. No focal extraluminal fluid collection or pneumoperitoneum. Musculoskeletal: No acute or significant osseous findings. IMPRESSION: 1. Repeat study with intravenous but no enteric contrast demonstrates mild small bowel wall thickening and mesenteric edema without significant residual bowel distension. Findings are nonspecific, but suggestive of enteritis. Correlate clinically. 2. Moderate amount of low-density free pelvic fluid again noted, nonspecific. No focal fluid collection or pneumoperitoneum. 3. Normal appendix. 4. No acute vascular findings. 5.  Aortic Atherosclerosis (ICD10-I70.0). Electronically Signed   By: Elsie Perone M.D.   On: 11/30/2023 16:27   CT ABDOMEN PELVIS WO CONTRAST Result Date: 11/30/2023 CLINICAL DATA:  Abdominal pain, acute, nonlocalized 1 year ago Nissen fundoplication, prior cholecystectomy Diarrhea and right-sided abdominal pain for 3 days. EXAM: CT ABDOMEN AND PELVIS WITHOUT CONTRAST TECHNIQUE: Multidetector CT imaging of the abdomen and pelvis was performed following the standard protocol without IV contrast. RADIATION DOSE REDUCTION: This exam was performed according to the departmental dose-optimization program which includes automated exposure control, adjustment of the mA and/or  kV according to patient size and/or use of iterative reconstruction technique. COMPARISON:  Abdominopelvic CT 12/17/2022. FINDINGS: Lower chest: Clear lung bases. No significant pleural or pericardial effusion. Aortic atherosclerosis noted. Hepatobiliary: The liver has a non cirrhotic morphology without suspicious focal abnormality on noncontrast imaging. Small low-density lesion in the left lobe on image 27/2 is unchanged, likely a cyst. Previous cholecystectomy without evidence of  biliary dilatation. Pancreas: Unremarkable. No pancreatic ductal dilatation or surrounding inflammatory changes. Spleen: Normal in size without focal abnormality. Adrenals/Urinary Tract: Both adrenal glands appear normal. No evidence of urinary tract calculus, suspicious renal lesion or hydronephrosis. There is a stable 1.0 cm angiomyolipoma in the lower pole of the left kidney on image 29/2 for which no specific follow-up imaging is recommended. The bladder appears unremarkable for its degree of distention. Stomach/Bowel: No enteric contrast administered. The stomach appears unremarkable for its degree of distention. The proximal small bowel appears unremarkable. There are scattered loops of mildly dilated and fluid-filled mid small bowel with decompression of the terminal ileum. No focal transition point identified. The colon is decompressed with possible mild diffuse wall thickening. There are diverticular changes in the sigmoid colon without focal surrounding inflammation. The appendix appears normal. Vascular/Lymphatic: There are no enlarged abdominal or pelvic lymph nodes. Diffuse aortic and branch vessel atherosclerosis without evidence of aneurysm. Reproductive: Previous hysterectomy.  No evidence of adnexal mass. Other: New moderate volume of low-density pelvic ascites. No focal fluid collection, peritoneal nodularity or pneumoperitoneum. Musculoskeletal: No acute or significant osseous findings. IMPRESSION: 1. New moderate  volume of low-density pelvic ascites, nonspecific. No focal fluid collection, peritoneal nodularity or pneumoperitoneum. 2. Scattered loops of mildly dilated and fluid-filled mid small bowel with decompression of the terminal ileum. No focal transition point identified. Findings are nonspecific, but could reflect a mild enteritis or early/partial bowel obstruction. Follow-up imaging with intravenous and enteric contrast may be helpful. 3. The colon is decompressed with possible mild diffuse wall thickening. Sigmoid diverticulosis without evidence of acute inflammation. 4.  Aortic Atherosclerosis (ICD10-I70.0). Electronically Signed   By: Elsie Perone M.D.   On: 11/30/2023 11:24      Discussed further with the patient she reports she was on 3 different antibiotics about a month ago for a sinus infection.  That this would raise the potential risk of C. difficile.  She does not have fever she does not have severe leukocytosis.  Denies bloody stool or travel.  She does report large volumes of loose stool  On reexam she has moderate to fairly significant tenderness especially across the left lower and lower pelvis.  There is no rebound guarding or frank peritonitis though.  Her CT imaging demonstrates no obvious acute surgical emergency.  Findings seem most consistent with an enteritis like picture   Given the associated amount of free fluid, ongoing symptomatology, discussed with the patient and I will have the patient evaluated by the hospitalist service for admission, I think she would benefit by ruling out C. difficile obtaining stool studies, check lactic acid to evaluate for mesenteric ischemic cause though this seems unlikely based on the history and imaging findings, and further abdominal pain observation and treatment for symptoms.  Consulted with hospitalist Dr. Seena Dicky Anes, MD 12/01/23 [phone]

## 2023-11-30 NOTE — ED Triage Notes (Signed)
 Patient to ED via POV for diarrhea since Thursday. States also having right sided abd pain.

## 2023-12-01 ENCOUNTER — Observation Stay

## 2023-12-01 DIAGNOSIS — K529 Noninfective gastroenteritis and colitis, unspecified: Secondary | ICD-10-CM | POA: Diagnosis not present

## 2023-12-01 DIAGNOSIS — J449 Chronic obstructive pulmonary disease, unspecified: Secondary | ICD-10-CM | POA: Diagnosis not present

## 2023-12-01 DIAGNOSIS — R140 Abdominal distension (gaseous): Secondary | ICD-10-CM | POA: Diagnosis not present

## 2023-12-01 LAB — COMPREHENSIVE METABOLIC PANEL WITH GFR
ALT: 14 U/L (ref 0–44)
AST: 16 U/L (ref 15–41)
Albumin: 2.8 g/dL — ABNORMAL LOW (ref 3.5–5.0)
Alkaline Phosphatase: 65 U/L (ref 38–126)
Anion gap: 6 (ref 5–15)
BUN: 11 mg/dL (ref 6–20)
CO2: 25 mmol/L (ref 22–32)
Calcium: 7.9 mg/dL — ABNORMAL LOW (ref 8.9–10.3)
Chloride: 105 mmol/L (ref 98–111)
Creatinine, Ser: 0.77 mg/dL (ref 0.44–1.00)
GFR, Estimated: >60 mL/min (ref >60)
Glucose, Bld: 80 mg/dL (ref 70–99)
Potassium: 3.3 mmol/L — ABNORMAL LOW (ref 3.5–5.1)
Sodium: 136 mmol/L (ref 135–145)
Total Bilirubin: 0.3 mg/dL (ref 0.0–1.2)
Total Protein: 5.6 g/dL — ABNORMAL LOW (ref 6.5–8.1)

## 2023-12-01 LAB — HIV ANTIBODY (ROUTINE TESTING W REFLEX): HIV Screen 4th Generation wRfx: NONREACTIVE

## 2023-12-01 LAB — GASTROINTESTINAL PANEL BY PCR, STOOL (REPLACES STOOL CULTURE)

## 2023-12-01 LAB — CBC
HCT: 36.8 % (ref 36.0–46.0)
Hemoglobin: 12.1 g/dL (ref 12.0–15.0)
MCH: 28.7 pg (ref 26.0–34.0)
MCHC: 32.9 g/dL (ref 30.0–36.0)
MCV: 87.4 fL (ref 80.0–100.0)
Platelets: 236 K/uL (ref 150–400)
RBC: 4.21 MIL/uL (ref 3.87–5.11)
RDW: 14 % (ref 11.5–15.5)
WBC: 8.3 K/uL (ref 4.0–10.5)
nRBC: 0 % (ref 0.0–0.2)

## 2023-12-01 LAB — C DIFFICILE QUICK SCREEN W PCR REFLEX
C Diff antigen: NEGATIVE
C Diff interpretation: NOT DETECTED
C Diff toxin: NEGATIVE

## 2023-12-01 MED ORDER — SODIUM CHLORIDE 0.9 % IV SOLN: INTRAVENOUS | Status: DC

## 2023-12-01 NOTE — Care Management Obs Status (Signed)
 MEDICARE OBSERVATION STATUS NOTIFICATION   Patient Details  Name: Andrea [name] MRN: [id number] Date of Birth: 1965-10-06   Medicare Observation Status Notification Given:  Yes    Arrow Emmerich W, CMA 12/01/2023, 11:08 AM

## 2023-12-01 NOTE — Progress Notes (Signed)
  PROGRESS NOTE    Andrea [name]  FMW:[id number] DOB: 1966-01-11 DOA: 11/30/2023 PCP: [name], MD  132A/132A-AA  LOS: 0 days   Brief hospital course:   Assessment & Plan: Andrea [name] is a 58 y.o. female with medical history significant of hypertension, hyperlipidemia, GERD status post Nissen fundoplication, restless leg syndrome, pain disorder, depression, COPD, psoriasis, discoid lupus, fibromyalgia presenting with diarrhea and abdominal pain    Enteritis > Patient presenting with abdominal pain and diarrhea, worsening for the past 3 days. > Patient has had nausea without significant vomiting so has been able to keep down p.o. > CT abdomen pelvis with contrast showed small bowel wall thickening and mesenteric edema likely enteritis.  Moderate pelvic ascites was again redemonstrated. > Patient did have mild leukocytosis to 10.9, which improved without abx Plan: --supportive care --no need for abx, discussed with ID --no need for surgical consult, discussed with GenSurg   Hypertension - Not currently on medication for this   Hyperlipidemia --cont statin   GERD Paraesophageal hernia > Status post robotic assisted laparoscopic repair of paraesophageal hernia with Bio-A Mesh and Nissen fundoplication. --cont PPI   Pain disorder Depression - Continue home clonazepam    COPD - Continue home Breztri  and as needed albuterol    Psoriasis Discoid lupus - Continue home hydroxychloroquine    Restless legs Fibromyalgia - Noted  Hypokalemia --supplement PRN   DVT prophylaxis: Lovenox  SQ Code Status: Full code  Family Communication: family updated at bedside today Level of care: Med-Surg Dispo:   The patient is from: home Anticipated d/c is to: home Anticipated d/c date is: 1-2 days   Subjective and Interval History:  Pt reported diarrhea stopped after presentation.  Pt complained of abdominal distention and pain.   Objective: Vitals:   11/30/23 1927  12/01/23 0414 12/01/23 0744 12/01/23 1505  BP: (!) 97/52 (!) 91/55 (!) 97/52 (!) 123/56  Pulse: 71 74 71 70  Resp: 17 18 18 16   Temp: 97.8 F (36.6 C) 97.8 F (36.6 C) (!) 97.5 F (36.4 C) 97.8 F (36.6 C)  TempSrc: Oral Oral    SpO2: 95% 99% 95% 97%  Weight:      Height:        Intake/Output Summary (Last 24 hours) at 12/01/2023 1830 Last data filed at 12/01/2023 1300 Gross per 24 hour  Intake 1370.43 ml  Output --  Net 1370.43 ml   Filed Weights   11/30/23 0834  Weight: 58 kg    Examination:   Constitutional: NAD, AAOx3 HEENT: conjunctivae and lids normal, EOMI CV: No cyanosis.   RESP: normal respiratory effort, on RA Abdomen: abdomen mildly large but soft Neuro: II - XII grossly intact.     Data Reviewed: I have personally reviewed labs and imaging studies  Time spent: 50 minutes  Ellouise Haber, MD [facility] If 7PM-7AM, please contact night-coverage 12/01/2023, 6:30 PM

## 2023-12-01 NOTE — Plan of Care (Signed)

## 2023-12-02 ENCOUNTER — Observation Stay

## 2023-12-02 DIAGNOSIS — K529 Noninfective gastroenteritis and colitis, unspecified: Secondary | ICD-10-CM | POA: Diagnosis not present

## 2023-12-02 LAB — POTASSIUM: Potassium: 3.8 mmol/L (ref 3.5–5.1)

## 2023-12-02 LAB — MAGNESIUM: Magnesium: 1.8 mg/dL (ref 1.7–2.4)

## 2023-12-02 MED ORDER — HYDROCODONE-ACETAMINOPHEN 5-325 MG PO TABS: 1.0000 | ORAL_TABLET | Freq: Four times a day (QID) | ORAL | 0 refills | Status: DC | PRN

## 2023-12-02 MED ORDER — ONDANSETRON HCL 4 MG PO TABS: 4.0000 mg | ORAL_TABLET | Freq: Four times a day (QID) | ORAL | 0 refills | Status: DC | PRN

## 2023-12-02 MED ORDER — LOPERAMIDE HCL 2 MG PO CAPS
2.0000 mg | ORAL_CAPSULE | ORAL | Status: DC | PRN
  Administered 2023-12-02: 2 mg via ORAL
  Filled 2023-12-02: qty 1

## 2023-12-02 MED ORDER — HYDROXYCHLOROQUINE SULFATE 200 MG PO TABS: 200.0000 mg | ORAL_TABLET | Freq: Two times a day (BID) | ORAL | Status: DC

## 2023-12-02 MED ORDER — DICYCLOMINE HCL 10 MG PO CAPS
10.0000 mg | ORAL_CAPSULE | Freq: Three times a day (TID) | ORAL | Status: DC | PRN
  Administered 2023-12-02: 10 mg via ORAL
  Filled 2023-12-02 (×2): qty 1

## 2023-12-02 NOTE — Plan of Care (Signed)

## 2023-12-02 NOTE — Plan of Care (Signed)
  Problem: Education: Goal: Knowledge of General Education information will improve Description: Including pain rating scale, medication(s)/side effects and non-pharmacologic comfort measures Outcome: Progressing   Problem: Clinical Measurements: Goal: Ability to maintain clinical measurements within normal limits will improve Outcome: Progressing   Problem: Elimination: Goal: Will not experience complications related to bowel motility Outcome: Progressing   Problem: Pain Managment: Goal: General experience of comfort will improve and/or be controlled Outcome: Progressing   Problem: Skin Integrity: Goal: Risk for impaired skin integrity will decrease Outcome: Progressing

## 2023-12-02 NOTE — Discharge Summary (Signed)
 Physician Discharge Summary   Andrea [name]  female DOB: 1965/12/31  FMW:[id number]  PCP: [name], MD  Admit date: 11/30/2023 Discharge date: 12/02/2023  Admitted From: home Disposition:  home CODE STATUS: Full code   Hospital Course:  For full details, please see H&P, progress notes, consult notes and ancillary notes.  Briefly,  Andrea [name] is a 58 y.o. female with medical history significant of hypertension, GERD, paraesophageal hernia repair, COPD, psoriasis, discoid lupus, fibromyalgia presenting with diarrhea and abdominal pain.    Enteritis > Patient presenting with abdominal pain and diarrhea, worsening for the past 3 days. > CT abdomen pelvis with contrast showed small bowel wall thickening and mesenteric edema likely enteritis.  Nonspecific pelvic ascites. --C diff and GI path neg. > Patient did have mild leukocytosis to 10.9, which improved without abx --no need for abx, discussed with ID --no need for surgical consult, discussed with GenSurg. --symptoms improved prior to discharge.  Hypertension - Not currently on medication for this   Hyperlipidemia --cont statin   GERD Paraesophageal hernia > Status post robotic assisted laparoscopic repair of paraesophageal hernia with Bio-A Mesh on 03/28/22. --cont home PPI   Anxiety on chronic benzo - Continue home clonazepam    COPD - Continue home Breztri  and as needed albuterol    Psoriasis Discoid lupus - Continue home hydroxychloroquine    Hypokalemia --supplemented PRN   Unless noted above, medications under STOP list are ones pt was not taking PTA.  Discharge Diagnoses:  Principal Problem:   Enteritis of small bowel Active Problems:   COPD, GOLD B   Depression   Discoid lupus   Essential (primary) hypertension   Fibromyalgia   GERD (gastroesophageal reflux disease)   Panic disorder   Hyperlipidemia   Psoriasis   Restless leg     Discharge Instructions:  Allergies as of  12/02/2023       Reactions   Alum & Mag Hydroxide-simeth Diarrhea, Nausea Only   Bactrim  [sulfamethoxazole -trimethoprim ] Itching   Cefdinir Other (See Comments)   tachycardia   Chantix [varenicline]    Bad dreams   Chlorhexidine  Gluconate Swelling   Doxycycline  Other (See Comments)   Nausea, migraine   Fentanyl  Nausea And Vomiting   Multaq  [dronedarone ] Other (See Comments)   Lip/ mouth numbness/ tongue swelling        Medication List     STOP taking these medications    OXYGEN    prednisoLONE  sodium phosphate  1 % ophthalmic solution Commonly known as: INFLAMASE FORTE        TAKE these medications    AeroChamber MV inhaler Use as instructed   albuterol  (2.5 MG/3ML) 0.083% nebulizer solution Commonly known as: PROVENTIL  USE 1 VIAL IN NEBULIZER EVERY 4 (FOUR) HOURS AS NEEDED FOR WHEEZING OR SHORTNESS OF BREATH.   albuterol  108 (90 Base) MCG/ACT inhaler Commonly known as: VENTOLIN  HFA Inhale 2 puffs into the lungs every 6 (six) hours as needed for wheezing or shortness of breath.   atorvastatin  40 MG tablet Commonly known as: LIPITOR TAKE 1 TABLET BY MOUTH DAILY   Breztri  Aerosphere 160-9-4.8 MCG/ACT Aero inhaler Generic drug: budesonide -glycopyrrolate -formoterol  Inhale 2 puffs into the lungs in the morning and at bedtime.   clonazePAM  1 MG tablet Commonly known as: KLONOPIN  Take 1 tablet (1 mg total) by mouth 2 (two) times daily as needed. TAKES EVERY DAY   Compressor/Nebulizer Misc Supplies only   Compressor/Nebulizer Misc Use as directed   dexlansoprazole  60 MG capsule Commonly known as: DEXILANT  TAKE 1 CAPSULE BY MOUTH  EVERY DAY   fluticasone  50 MCG/ACT nasal spray Commonly known as: FLONASE  Place 2 sprays into both nostrils as needed.   Flutter Devi 1 Device by Does not apply route daily.   HYDROcodone-acetaminophen  5-325 MG tablet Commonly known as: NORCO/VICODIN Take 1 tablet by mouth every 6 (six) hours as needed for moderate pain (pain  score 4-6) or severe pain (pain score 7-10).   hydroxychloroquine  200 MG tablet Commonly known as: PLAQUENIL  Take 1 tablet (200 mg total) by mouth 2 (two) times daily. Home med. What changed:  how much to take when to take this additional instructions   nitroGLYCERIN  0.4 MG SL tablet Commonly known as: NITROSTAT  Place 1 tablet (0.4 mg total) under the tongue every 5 (five) minutes as needed for chest pain.   ondansetron  4 MG tablet Commonly known as: ZOFRAN  Take 1 tablet (4 mg total) by mouth every 6 (six) hours as needed for nausea.   Pulse Oximeter Deluxe Misc Use daily to monitor oxygen  levels         Follow-up Information     [name], MD Follow up in 1 week(s).   Specialty: Family Medicine Contact information: [address] [place] [address] [phone]                 Allergies  Allergen Reactions   Alum & Mag Hydroxide-Simeth Diarrhea and Nausea Only   Bactrim  [Sulfamethoxazole -Trimethoprim ] Itching   Cefdinir Other (See Comments)    tachycardia   Chantix [Varenicline]     Bad dreams   Chlorhexidine  Gluconate Swelling   Doxycycline  Other (See Comments)    Nausea, migraine   Fentanyl  Nausea And Vomiting   Multaq  [Dronedarone ] Other (See Comments)    Lip/ mouth numbness/ tongue swelling     The results of significant diagnostics from this hospitalization (including imaging, microbiology, ancillary and laboratory) are listed below for reference.   Consultations:   Procedures/Studies: DG Abd 1 View Result Date: 12/02/2023 CLINICAL DATA:  Diarrhea.  Enteritis. EXAM: ABDOMEN - 1 VIEW COMPARISON:  12/01/2023 FINDINGS: Decreased bowel gas is seen since previous study. No evidence of dilated bowel loops. No radio-opaque calculi or other significant radiographic abnormality seen. IMPRESSION: Unremarkable bowel gas pattern. Electronically Signed   By: Norleen DELENA Kil M.D.   On: 12/02/2023 09:13   DG Abd 1 View Result Date:  12/01/2023 CLINICAL DATA:  Abdominal distension. EXAM: ABDOMEN - 1 VIEW COMPARISON:  None Available. FINDINGS: Moderate stool throughout the colon. No bowel dilatation or evidence of obstruction. Osteopenia with degenerative changes. No acute osseous pathology. IMPRESSION: Moderate colonic stool burden. No bowel obstruction. Electronically Signed   By: Vanetta Chou M.D.   On: 12/01/2023 18:45   CT ABDOMEN PELVIS W CONTRAST Result Date: 11/30/2023 CLINICAL DATA:  Abdominal pain, acute, nonlocalized Lowered saddle pain with new onset ascites that is unexplained. No history of cirrhosis. Unexplained weight loss EXAM: CT ABDOMEN AND PELVIS WITH CONTRAST TECHNIQUE: Multidetector CT imaging of the abdomen and pelvis was performed using the standard protocol following bolus administration of intravenous contrast. RADIATION DOSE REDUCTION: This exam was performed according to the departmental dose-optimization program which includes automated exposure control, adjustment of the mA and/or kV according to patient size and/or use of iterative reconstruction technique. CONTRAST:  100mL OMNIPAQUE IOHEXOL 300 MG/ML  SOLN COMPARISON:  Noncontrast abdominopelvic CT performed earlier the same date and 12/17/2022. FINDINGS: Lower chest: Clear lung bases. No significant pleural or pericardial effusion. Hepatobiliary: The liver is normal in density without suspicious  focal abnormality. There are scattered tiny low-density lesions which are likely cysts or biliary hamartomas. Status post cholecystectomy with minimal intrahepatic biliary prominence. No evidence of choledocholithiasis. Pancreas: Unremarkable. No pancreatic ductal dilatation or surrounding inflammatory changes. Spleen: Normal in size without focal abnormality. Adrenals/Urinary Tract: Both adrenal glands appear normal. No evidence of urinary tract calculus, suspicious renal lesion or hydronephrosis. Unchanged 1.0 cm angiomyolipoma in the lower pole of the left  kidney, again not requiring any specific imaging follow-up. The bladder appears unremarkable for its degree of distention. Stomach/Bowel: No enteric contrast administered. The stomach remains unremarkable for its degree of distention. Previously demonstrated small bowel distention appears decreased. However, there is mild small bowel wall thickening and mesenteric edema in the mid abdomen. The appendix appears normal. Interval improved distension of the colon which demonstrates no wall thickening or surrounding inflammation. Mild distal colonic diverticulosis again noted without evidence of acute inflammation. Vascular/Lymphatic: There are no enlarged abdominal or pelvic lymph nodes. Aortic and branch vessel atherosclerosis without evidence of aneurysm or large vessel occlusion. The portal, superior mesenteric and splenic veins are patent. Reproductive: Status post hysterectomy.  No adnexal mass. Other: As shown on earlier noncontrast study, there is a moderate amount of low-density free pelvic fluid. This demonstrates no high-density components to suggest hemorrhage. There is mild mesenteric edema. No focal extraluminal fluid collection or pneumoperitoneum. Musculoskeletal: No acute or significant osseous findings. IMPRESSION: 1. Repeat study with intravenous but no enteric contrast demonstrates mild small bowel wall thickening and mesenteric edema without significant residual bowel distension. Findings are nonspecific, but suggestive of enteritis. Correlate clinically. 2. Moderate amount of low-density free pelvic fluid again noted, nonspecific. No focal fluid collection or pneumoperitoneum. 3. Normal appendix. 4. No acute vascular findings. 5.  Aortic Atherosclerosis (ICD10-I70.0). Electronically Signed   By: Elsie Perone M.D.   On: 11/30/2023 16:27   CT ABDOMEN PELVIS WO CONTRAST Result Date: 11/30/2023 CLINICAL DATA:  Abdominal pain, acute, nonlocalized 1 year ago Nissen fundoplication, prior  cholecystectomy Diarrhea and right-sided abdominal pain for 3 days. EXAM: CT ABDOMEN AND PELVIS WITHOUT CONTRAST TECHNIQUE: Multidetector CT imaging of the abdomen and pelvis was performed following the standard protocol without IV contrast. RADIATION DOSE REDUCTION: This exam was performed according to the departmental dose-optimization program which includes automated exposure control, adjustment of the mA and/or kV according to patient size and/or use of iterative reconstruction technique. COMPARISON:  Abdominopelvic CT 12/17/2022. FINDINGS: Lower chest: Clear lung bases. No significant pleural or pericardial effusion. Aortic atherosclerosis noted. Hepatobiliary: The liver has a non cirrhotic morphology without suspicious focal abnormality on noncontrast imaging. Small low-density lesion in the left lobe on image 27/2 is unchanged, likely a cyst. Previous cholecystectomy without evidence of biliary dilatation. Pancreas: Unremarkable. No pancreatic ductal dilatation or surrounding inflammatory changes. Spleen: Normal in size without focal abnormality. Adrenals/Urinary Tract: Both adrenal glands appear normal. No evidence of urinary tract calculus, suspicious renal lesion or hydronephrosis. There is a stable 1.0 cm angiomyolipoma in the lower pole of the left kidney on image 29/2 for which no specific follow-up imaging is recommended. The bladder appears unremarkable for its degree of distention. Stomach/Bowel: No enteric contrast administered. The stomach appears unremarkable for its degree of distention. The proximal small bowel appears unremarkable. There are scattered loops of mildly dilated and fluid-filled mid small bowel with decompression of the terminal ileum. No focal transition point identified. The colon is decompressed with possible mild diffuse wall thickening. There are diverticular changes in the sigmoid colon without focal surrounding  inflammation. The appendix appears normal. Vascular/Lymphatic:  There are no enlarged abdominal or pelvic lymph nodes. Diffuse aortic and branch vessel atherosclerosis without evidence of aneurysm. Reproductive: Previous hysterectomy.  No evidence of adnexal mass. Other: New moderate volume of low-density pelvic ascites. No focal fluid collection, peritoneal nodularity or pneumoperitoneum. Musculoskeletal: No acute or significant osseous findings. IMPRESSION: 1. New moderate volume of low-density pelvic ascites, nonspecific. No focal fluid collection, peritoneal nodularity or pneumoperitoneum. 2. Scattered loops of mildly dilated and fluid-filled mid small bowel with decompression of the terminal ileum. No focal transition point identified. Findings are nonspecific, but could reflect a mild enteritis or early/partial bowel obstruction. Follow-up imaging with intravenous and enteric contrast may be helpful. 3. The colon is decompressed with possible mild diffuse wall thickening. Sigmoid diverticulosis without evidence of acute inflammation. 4.  Aortic Atherosclerosis (ICD10-I70.0). Electronically Signed   By: Elsie Perone M.D.   On: 11/30/2023 11:24   CT Chest Wo Contrast Result Date: 11/30/2023 CLINICAL DATA:  Weight loss, unintended Shortness of breath and exhaustion. No history of malignancy or prior relevant surgery. EXAM: CT CHEST WITHOUT CONTRAST TECHNIQUE: Multidetector CT imaging of the chest was performed following the standard protocol without IV contrast. RADIATION DOSE REDUCTION: This exam was performed according to the departmental dose-optimization program which includes automated exposure control, adjustment of the mA and/or kV according to patient size and/or use of iterative reconstruction technique. COMPARISON:  Radiographs 07/22/2023.  CT 01/10/2020. FINDINGS: Cardiovascular: Atherosclerosis of the aorta, great vessels and coronary arteries. The heart size is normal. There is no pericardial effusion. Mediastinum/Nodes: There are no enlarged mediastinal,  hilar or axillary lymph nodes.Hilar assessment is limited by the lack of intravenous contrast, although the hilar contours appear unchanged. The thyroid  gland, trachea and esophagus demonstrate no significant findings. Lungs/Pleura: No pleural effusion or pneumothorax. Moderate centrilobular emphysema. Unchanged scattered millimetric pulmonary nodules, some of which are calcified. No specific follow-up imaging recommended. No suspicious pulmonary nodules or confluent airspace disease. Upper abdomen: The visualized upper abdomen appears stable, without significant findings. Musculoskeletal/Chest wall: There is no chest wall mass or suspicious osseous finding. IMPRESSION: 1. No acute findings or explanation for the patient's symptoms. 2. No evidence of thoracic malignancy. 3. Aortic Atherosclerosis (ICD10-I70.0) and Emphysema (ICD10-J43.9). Electronically Signed   By: Elsie Perone M.D.   On: 11/30/2023 11:12      Labs: BNP (last 3 results) No results for input(s): BNP in the last 8760 hours. Basic Metabolic Panel: Recent Labs  Lab 11/30/23 0836 12/01/23 0305 12/02/23 0540  NA 139 136  --   K 3.9 3.3* 3.8  CL 102 105  --   CO2 29 25  --   GLUCOSE 108* 80  --   BUN 15 11  --   CREATININE 0.62 0.77  --   CALCIUM  9.1 7.9*  --   MG  --   --  1.8   Liver Function Tests: Recent Labs  Lab 11/30/23 0836 12/01/23 0305  AST 17 16  ALT 12 14  ALKPHOS 73 65  BILITOT 1.1 0.3  PROT 7.5 5.6*  ALBUMIN  3.7 2.8*   Recent Labs  Lab 11/30/23 0836  LIPASE 20   No results for input(s): AMMONIA in the last 168 hours. CBC: Recent Labs  Lab 11/30/23 0836 12/01/23 0305  WBC 10.9* 8.3  HGB 15.3* 12.1  HCT 45.2 36.8  MCV 85.4 87.4  PLT 299 236   Cardiac Enzymes: No results for input(s): CKTOTAL, CKMB, CKMBINDEX, TROPONINI in the last 168  hours. BNP: Invalid input(s): POCBNP CBG: No results for input(s): GLUCAP in the last 168 hours. D-Dimer No results for input(s):  DDIMER in the last 72 hours. Hgb A1c No results for input(s): HGBA1C in the last 72 hours. Lipid Profile No results for input(s): CHOL, HDL, LDLCALC, TRIG, CHOLHDL, LDLDIRECT in the last 72 hours. Thyroid  function studies No results for input(s): TSH, T4TOTAL, T3FREE, THYROIDAB in the last 72 hours.  Invalid input(s): FREET3 Anemia work up No results for input(s): VITAMINB12, FOLATE, FERRITIN, TIBC, IRON, RETICCTPCT in the last 72 hours. Urinalysis    Component Value Date/Time   COLORURINE STRAW (A) 11/30/2023 1217   APPEARANCEUR CLEAR (A) 11/30/2023 1217   APPEARANCEUR Clear 01/17/2014 2055   LABSPEC 1.004 (L) 11/30/2023 1217   LABSPEC 1.023 01/17/2014 2055   PHURINE 5.0 11/30/2023 1217   GLUCOSEU NEGATIVE 11/30/2023 1217   GLUCOSEU Negative 01/17/2014 2055   HGBUR NEGATIVE 11/30/2023 1217   BILIRUBINUR NEGATIVE 11/30/2023 1217   BILIRUBINUR Negative 06/04/2019 1604   BILIRUBINUR Negative 01/17/2014 2055   KETONESUR NEGATIVE 11/30/2023 1217   PROTEINUR NEGATIVE 11/30/2023 1217   UROBILINOGEN 0.2 06/04/2019 1604   NITRITE NEGATIVE 11/30/2023 1217   LEUKOCYTESUR NEGATIVE 11/30/2023 1217   LEUKOCYTESUR Trace 01/17/2014 2055   Sepsis Labs Recent Labs  Lab 11/30/23 0836 12/01/23 0305  WBC 10.9* 8.3   Microbiology Recent Results (from the past 240 hours)  Gastrointestinal Panel by PCR , Stool     Status: None   Collection Time: 12/01/23  9:21 PM   Specimen: Stool  Result Value Ref Range Status   Campylobacter species NOT DETECTED NOT DETECTED Final   Plesimonas shigelloides NOT DETECTED NOT DETECTED Final   Salmonella species NOT DETECTED NOT DETECTED Final   Yersinia enterocolitica NOT DETECTED NOT DETECTED Final   Vibrio species NOT DETECTED NOT DETECTED Final   Vibrio cholerae NOT DETECTED NOT DETECTED Final   Enteroaggregative E coli (EAEC) NOT DETECTED NOT DETECTED Final   Enteropathogenic E coli (EPEC) NOT DETECTED NOT  DETECTED Final   Enterotoxigenic E coli (ETEC) NOT DETECTED NOT DETECTED Final   Shiga like toxin producing E coli (STEC) NOT DETECTED NOT DETECTED Final   Shigella/Enteroinvasive E coli (EIEC) NOT DETECTED NOT DETECTED Final   Cryptosporidium NOT DETECTED NOT DETECTED Final   Cyclospora cayetanensis NOT DETECTED NOT DETECTED Final   Entamoeba histolytica NOT DETECTED NOT DETECTED Final   Giardia lamblia NOT DETECTED NOT DETECTED Final   Adenovirus F40/41 NOT DETECTED NOT DETECTED Final   Astrovirus NOT DETECTED NOT DETECTED Final   Norovirus GI/GII NOT DETECTED NOT DETECTED Final   Rotavirus A NOT DETECTED NOT DETECTED Final   Sapovirus (I, II, IV, and V) NOT DETECTED NOT DETECTED Final    Comment: Performed at [facility], [address] [place], [place] [address]  C Difficile Quick Screen w PCR reflex     Status: None   Collection Time: 12/01/23  9:21 PM   Specimen: STOOL  Result Value Ref Range Status   C Diff antigen NEGATIVE NEGATIVE Final   C Diff toxin NEGATIVE NEGATIVE Final   C Diff interpretation No C. difficile detected.  Final    Comment: Performed at [facility], [address] [place], [place] [address]     Total time spend on discharging this patient, including the last patient exam, discussing the hospital stay, instructions for ongoing care as it relates to all pertinent caregivers, as well as preparing the medical discharge records, prescriptions, and/or referrals as  applicable, is 35 minutes.    Ellouise Haber, MD  [facility] 12/02/2023, 1:12 PM

## 2023-12-03 ENCOUNTER — Telehealth: Payer: Self-pay

## 2023-12-03 NOTE — Transitions of Care (Post Inpatient/ED Visit) (Unsigned)
   12/03/2023  Name: Andrea [name] MRN: [id number] DOB: 05/19/1965  Today's TOC FU Call Status: Today's TOC FU Call Status:: Unsuccessful Call (1st Attempt) Unsuccessful Call (1st Attempt) Date: 12/03/23  Attempted to reach the patient regarding the most recent Inpatient/ED visit.  Follow Up Plan: Additional outreach attempts will be made to reach the patient to complete the Transitions of Care (Post Inpatient/ED visit) call.   Signature   Avelina Essex, CMA (AAMA)  [facility] [phone]

## 2023-12-04 NOTE — Progress Notes (Signed)
 Andrea [name]                                          MRN: [id number]   12/04/2023   The [facility] Quality Team Specialist reviewed this patient medical record for the purposes of chart review for care gap closure. The following were reviewed: chart review for care gap closure-breast cancer screening.    [facility] Quality Team

## 2023-12-05 ENCOUNTER — Ambulatory Visit (INDEPENDENT_AMBULATORY_CARE_PROVIDER_SITE_OTHER): Admitting: Family Medicine

## 2023-12-05 ENCOUNTER — Other Ambulatory Visit: Payer: Self-pay | Admitting: Family Medicine

## 2023-12-05 VITALS — BP 112/59 | HR 79 | Temp 98.4°F | Ht 65.0 in | Wt 131.0 lb

## 2023-12-05 DIAGNOSIS — K529 Noninfective gastroenteritis and colitis, unspecified: Secondary | ICD-10-CM

## 2023-12-05 DIAGNOSIS — H209 Unspecified iridocyclitis: Secondary | ICD-10-CM

## 2023-12-05 MED ORDER — DIFLUPREDNATE 0.05 % OP EMUL: 1.0000 [drp] | Freq: Four times a day (QID) | OPHTHALMIC | 0 refills | Status: AC

## 2023-12-05 NOTE — Progress Notes (Signed)
 Established patient visit   Patient: Andrea [name]   DOB: 1965/06/30   58 y.o. Female  MRN: [id number] Visit Date: 12/05/2023  Today's healthcare provider: Nancyann Perry, MD   Chief Complaint  Patient presents with   Abdominal Pain    Patient was hospitalized for abdominal pain and diarrhea.  She states she feels that she still has some blockage.  Patient is having soft bowel movements but very small and not often.   Diarrhea        Subjective    Discussed the use of AI scribe software for clinical note transcription with the patient, who gave verbal consent to proceed.  History of Present Illness   Andrea [name] is a 58 year old female who presents for a hospital follow-up after admission for diarrhea and abdominal pain.  She was admitted to [facility] on September 28th and discharged on September 30th for severe abdominal pain and diarrhea. The pain was intense, causing vomiting, and she initially suspected appendicitis. Symptoms began on Thursday afternoon and persisted until the following Wednesday, with diarrhea resolving to soft stools. A CT scan of the abdomen during her hospital stay showed small bowel wall thickening and mesenteric edema, along with a small amount of ascites. She was treated with IV fluids during her hospital stay.  Prior to her hospitalization, she experienced unexplained weight loss and underwent a chest CT with contrast on the day of her hospital admission, which was unremarkable except for some emphysematous changes. During her hospital stay, a comprehensive stool panel was conducted, which was negative for C. diff and other common infections.  She is currently managing her prescriptions well. Additionally, she has a history of uveitis and is experiencing a flare-up, for which she uses Durezol  eye drops. She is in the process of scheduling a follow-up appointment for this condition.       Medications: Outpatient Medications Prior to Visit   Medication Sig   albuterol  (PROVENTIL ) (2.5 MG/3ML) 0.083% nebulizer solution USE 1 VIAL IN NEBULIZER EVERY 4 (FOUR) HOURS AS NEEDED FOR WHEEZING OR SHORTNESS OF BREATH.   albuterol  (VENTOLIN  HFA) 108 (90 Base) MCG/ACT inhaler Inhale 2 puffs into the lungs every 6 (six) hours as needed for wheezing or shortness of breath.   atorvastatin  (LIPITOR) 40 MG tablet TAKE 1 TABLET BY MOUTH DAILY   budesonide -glycopyrrolate -formoterol  (BREZTRI  AEROSPHERE) 160-9-4.8 MCG/ACT AERO inhaler Inhale 2 puffs into the lungs in the morning and at bedtime.   clonazePAM  (KLONOPIN ) 1 MG tablet Take 1 tablet (1 mg total) by mouth 2 (two) times daily as needed. TAKES EVERY DAY   dexlansoprazole  (DEXILANT ) 60 MG capsule TAKE 1 CAPSULE BY MOUTH EVERY DAY   fluticasone  (FLONASE ) 50 MCG/ACT nasal spray Place 2 sprays into both nostrils as needed.   HYDROcodone-acetaminophen  (NORCO/VICODIN) 5-325 MG tablet Take 1 tablet by mouth every 6 (six) hours as needed for moderate pain (pain score 4-6) or severe pain (pain score 7-10).   hydroxychloroquine  (PLAQUENIL ) 200 MG tablet Take 1 tablet (200 mg total) by mouth 2 (two) times daily. Home med.   Misc. Devices (PULSE OXIMETER DELUXE) MISC Use daily to monitor oxygen  levels   Nebulizers (COMPRESSOR/NEBULIZER) MISC Supplies only   Nebulizers (COMPRESSOR/NEBULIZER) MISC Use as directed   nitroGLYCERIN  (NITROSTAT ) 0.4 MG SL tablet Place 1 tablet (0.4 mg total) under the tongue every 5 (five) minutes as needed for chest pain.   ondansetron  (ZOFRAN ) 4 MG tablet Take 1 tablet (4 mg total) by mouth every  6 (six) hours as needed for nausea.   Respiratory Therapy Supplies (FLUTTER) DEVI 1 Device by Does not apply route daily.   Spacer/Aero-Holding Chambers (AEROCHAMBER MV) inhaler Use as instructed   No facility-administered medications prior to visit.   Review of Systems  Constitutional:  Negative for appetite change, chills, fatigue and fever.  Respiratory:  Negative for chest  tightness and shortness of breath.   Cardiovascular:  Negative for chest pain and palpitations.  Gastrointestinal:  Negative for abdominal pain, nausea and vomiting.  Neurological:  Negative for dizziness and weakness.       Objective    BP (!) 112/59 (BP Location: Left Arm, Patient Position: Sitting, Cuff Size: Normal)   Pulse 79   Temp 98.4 F (36.9 C) (Oral)   Ht 5' 5 (1.651 m)   Wt 131 lb (59.4 kg)   SpO2 98%   BMI 21.80 kg/m   Physical Exam   General Appearance:    Well developed, well nourished female, alert, cooperative, in no acute distress  HENT:   ENT exam normal, no neck nodes or sinus tenderness  Eyes:    PERRL, conjunctiva/corneas clear, EOM's intact       Lungs:     Clear to auscultation bilaterally, respirations unlabored  Heart:    Normal heart rate. Normal rhythm. No murmurs, rubs, or gallops.    Neurologic:   Awake, alert, oriented x 3. No apparent focal neurological           defect.        Assessment & Plan        Hospital follow-up for recent viral enteritis, resolved Recent hospitalization for severe abdominal pain and diarrhea, likely viral enteritis. CT showed small bowel wall thickening and mesenteric edema. Symptoms resolved, stool tests negative for common infections, indicating viral etiology. Condition in healing phase. - Recommend probiotics to aid digestive recovery.  Uveitis, unspecified eye Uveitis with recent flare-up. Currently using Durezol  eye drops. Requires ophthalmology follow-up for management. Appointment scheduling in progress. - Send in a refill for Durezol  eye drops. - Ensure follow-up appointment with ophthalmology is scheduled.  General Health Maintenance Colonoscopy due August 2026. No immediate gastroenterology referral needed. - Plan to set up a referral for colonoscopy at next follow-up in 3-4 months.  Follow-Up Follow-up needed to monitor recovery from enteritis and manage uveitis. - Schedule follow-up appointment in  4 months.    Return in about 4 months (around 04/06/2024).     Nancyann Perry, MD  [facility] [phone] (phone) [phone] (fax)  [facility]

## 2023-12-05 NOTE — Patient Instructions (Signed)
 SABRA  Please review the attached list of medications and notify my office if there are any errors.   . Please bring all of your medications to every appointment so we can make sure that our medication list is the same as yours.

## 2023-12-23 DIAGNOSIS — J9621 Acute and chronic respiratory failure with hypoxia: Secondary | ICD-10-CM | POA: Diagnosis not present

## 2023-12-30 DIAGNOSIS — J449 Chronic obstructive pulmonary disease, unspecified: Secondary | ICD-10-CM | POA: Diagnosis not present

## 2023-12-31 ENCOUNTER — Other Ambulatory Visit: Payer: Self-pay | Admitting: Pulmonary Disease

## 2024-01-05 ENCOUNTER — Telehealth: Payer: Self-pay | Admitting: Family Medicine

## 2024-01-05 DIAGNOSIS — Z860101 Personal history of adenomatous and serrated colon polyps: Secondary | ICD-10-CM

## 2024-01-05 NOTE — Telephone Encounter (Signed)
 Copied from CRM [id number]. Topic: Referral - Request for Referral >> Jan 05, 2024  9:23 AM Lonell PEDLAR wrote: Did the patient discuss referral with their provider in the last year? Yes (If No - schedule appointment) (If Yes - send message)  Appointment offered? No, Patient recently had hospital f/u  Type of order/referral and detailed reason for visit: Gastroenterology  Preference of office, provider, location: [facility] [phone]  If referral order, have you been seen by this specialty before? Yes, sees a G.I every 5 years (If Yes, this issue or another issue? When? Where?  Can we respond through MyChart? Yes

## 2024-01-06 NOTE — Addendum Note (Signed)
 Addended by: [name] on: 01/06/2024 08:34 AM   Modules accepted: Orders

## 2024-01-07 ENCOUNTER — Telehealth: Payer: Self-pay | Admitting: Family Medicine

## 2024-01-07 NOTE — Telephone Encounter (Signed)
 Pt requesting call back to schedule colonoscopy

## 2024-01-08 ENCOUNTER — Telehealth: Payer: Self-pay

## 2024-01-08 ENCOUNTER — Other Ambulatory Visit: Payer: Self-pay | Admitting: Gastroenterology

## 2024-01-08 NOTE — Telephone Encounter (Signed)
 Contacted patient to schedule her for her colonoscopy.  Last colonoscopy was performed 10/05/2019 by Dr. Jinny.    Recommended repeat in 5 years. Noted on Letter dated 10/08/2019 (Not Due yet).  Last EGD 12/232/23 Dr. Jinny showed no sign of infection, inflammation, cancerous or precancerous tissue.   Last office visit with Dr. Jinny 12/03/22 for Reflux.  During triage discussion I noticed that she had a refill request refused for reflux medication required an office visit for refills.  Current GI Issues: Acid reflux, constipation despite taking miralax  for 1 week.    Family history of colon cancer-specific relation to patient unsure.  Cardiac history: pt stated she has heart murmur, palpitations. Has not see Dr Darron since 06/24/19 for PVC's.  Discussed this with her and asked her to call Dr. Renato office to schedule an appt because they had tried to contact in 2024 to schedule a visit.  Thanks,  [place], CMA

## 2024-01-11 ENCOUNTER — Encounter: Payer: Self-pay | Admitting: Pulmonary Disease

## 2024-01-12 ENCOUNTER — Ambulatory Visit: Admitting: Pulmonary Disease

## 2024-01-13 NOTE — Progress Notes (Unsigned)
 01/14/2024 Andrea [name] [id number] [birth year]  Gastroenterology Office Note     Primary Care Physician:  [name], MD  Primary GI Provider: Jinny Carmine, MD    Chief Complaint   Chief Complaint  Patient presents with   Other    Constipation- takes miralax  daily but unable to have normal BM- abd pain- takes dexilant  for reflux     History of Present Illness   Andrea [name] is a 58 y.o. female with PMHX of GERD status post Nissen fundoplication presenting today for constipation.  Patient reports that she has been having constipation since the end of September.  States she was hospitalized in September for diarrhea and abdominal pain, but since leaving the hospital she has had constipation.  Reports prior to September she typically had a bowel movement every day. She has been taking MiraLAX  and drinking a lot of water daily but it is not helping.  She is going 3 to 4 days without having a bowel movement, is having intermittent mild abdominal pain.  Reports she had a bowel movement this morning but it was small hard balls.  Reports she does see a small amount of blood when she wipes when she has to strain and constipated.   Patient takes Dexilant  for reflux.  She states that it helps helps for the most part but may have some reflux flares where she has foamy acid that comes back up in her throat twice a month.  She states after having surgery for hiatal hernia she would have shortness of breath after eating. Patient reports she eats 1 meal a day and has tried to eat smaller meals but that did not help.  Patient states she went back to see her surgeon per Dr. Felicitas advice, but surgeon did not offer any help or other surgical options/revisions to help with reflux. She saw her pulmonologist a few months ago and  was referred to [facility] clinic to see if they can help with further reflux issues, but she never got a call from them.  Denies nausea, vomiting.  Denies alcohol use,  she smokes half pack of cigarettes daily.   Patient last seen by Dr. Jinny on 12/22/2022 for reflux causing shortness of breath after she eats.   Patient hospitalized on 11/30/2023- 12/02/2023 for diarrhea and abdominal pain. C.Diff and stool studies negative.   12/02/2023 CT abdomen pelvis IMPRESSION: 1. Repeat study with intravenous but no enteric contrast demonstrates mild small bowel wall thickening and mesenteric edema without significant residual bowel distension. Findings are nonspecific, but suggestive of enteritis. Correlate clinically. 2. Moderate amount of low-density free pelvic fluid again noted, nonspecific. No focal fluid collection or pneumoperitoneum. 3. Normal appendix. 4. No acute vascular findings. 5.  Aortic Atherosclerosis (ICD10-I70.0).  02/21/2022 EGD - Normal esophagus and duodenum  - Gastritis. NEGATIVE FOR H. PYLORI, INTESTINAL METAPLASIA, DYSPLASIA, AND MALIGNANCY.   Repeat colonoscopy due 10/2024.   Past Medical History:  Diagnosis Date   Anxiety    panic attacks, chest pain   Arthritis    COPD (chronic obstructive pulmonary disease) (HCC)    COVID    2023   Dyspnea    GERD (gastroesophageal reflux disease)    History of hiatal hernia    Hypertension    not medicated   Lung nodule, multiple 06/10/2013   per records from Dr. McQuaid is to repeat Ct in 1 year (April, 2016)     >>OVERVIEW FOR PULMONARY NODULE WRITTEN ON 07/08/2014  7:19 AM  BY MCQUAID, VICENTA B, MD     06/2013 CT high res chest> mild centrilobular emphysema but no ILD, multiple scattered pulm nodules all 4mm in size  07/2014 CT chest > nodule unchanged     Non-obstructive CAD in native artery    a. cardiac cath 01/2014: showed minor irregularities, mildly elevated left ventricular end-diastolic pressure and normal ejection fraction; b. 03/2015 MV: low risk.   Palpitations    a. 08/2016 Event Monitor: occas PVC's, two brief runs of SVT (4 and 7 beats).   Panic attacks 02/11/2012   PONV  (postoperative nausea and vomiting)    PONV (postoperative nausea and vomiting)    Stage 2 moderate COPD by GOLD classification (HCC)    Symptomatic PVC's (premature ventricular contractions)    Tobacco abuse    Wears dentures    partial upper and lower    Past Surgical History:  Procedure Laterality Date   CARDIAC CATHETERIZATION  01/03/2014   CARDIAC CATHETERIZATION     CATARACT EXTRACTION W/PHACO Left 11/27/2022   Procedure: CATARACT EXTRACTION PHACO AND INTRAOCULAR LENS PLACEMENT (IOC) LEFT MALYUGIN  3.59  00:31.6;  Surgeon: Mittie Gaskin, MD;  Location: [facility];  Service: Ophthalmology;  Laterality: Left;   CHOLECYSTECTOMY     COLONOSCOPY WITH PROPOFOL  N/A 10/05/2019   Procedure: COLONOSCOPY WITH PROPOFOL ;  Surgeon: Jinny Carmine, MD;  Location: [facility];  Service: Endoscopy;  Laterality: N/A;   ESOPHAGOGASTRODUODENOSCOPY (EGD) WITH PROPOFOL  N/A 07/11/2016   Procedure: ESOPHAGOGASTRODUODENOSCOPY (EGD) WITH PROPOFOL ;  Surgeon: Jinny Carmine, MD;  Location: [facility];  Service: Endoscopy;  Laterality: N/A;   ESOPHAGOGASTRODUODENOSCOPY (EGD) WITH PROPOFOL  N/A 10/05/2019   Procedure: ESOPHAGOGASTRODUODENOSCOPY (EGD) WITH PROPOFOL ;  Surgeon: Jinny Carmine, MD;  Location: [facility];  Service: Endoscopy;  Laterality: N/A;   ESOPHAGOGASTRODUODENOSCOPY (EGD) WITH PROPOFOL  N/A 02/21/2022   Procedure: ESOPHAGOGASTRODUODENOSCOPY (EGD) WITH PROPOFOL ;  Surgeon: Jinny Carmine, MD;  Location: [facility];  Service: Endoscopy;  Laterality: N/A;   EYE SURGERY     IMAGE GUIDED SINUS SURGERY N/A 11/30/2014   Procedure: IMAGE GUIDED SINUS SURGERY;  Surgeon: Carolee Hunter, MD;  Location: [facility];  Service: ENT;  Laterality: N/A;  GAVE DISK TO CE CE   INSERTION OF MESH  03/28/2022   Procedure: INSERTION OF MESH;  Surgeon: Jordis Laneta FALCON, MD;  Location: [facility];  Service: General;;   MAXILLARY ANTROSTOMY Bilateral 11/30/2014   Procedure: MAXILLARY  ANTROSTOMY;  Surgeon: Carolee Hunter, MD;  Location: [facility];  Service: ENT;  Laterality: Bilateral;   SEPTOPLASTY N/A 11/30/2014   Procedure: SEPTOPLASTY;  Surgeon: Carolee Hunter, MD;  Location: [facility];  Service: ENT;  Laterality: N/A;   SPHENOIDECTOMY Left 11/30/2014   Procedure: CLEONE, ;  Surgeon: Carolee Hunter, MD;  Location: [facility];  Service: ENT;  Laterality: Left;   VAGINAL HYSTERECTOMY  1995   vaginal   XI ROBOTIC ASSISTED PARAESOPHAGEAL HERNIA REPAIR N/A 03/28/2022   Procedure: XI ROBOTIC ASSISTED PARAESOPHAGEAL HERNIA REPAIR, RNFA to assist;  Surgeon: Jordis Laneta FALCON, MD;  Location: [facility];  Service: General;  Laterality: N/A;    Current Outpatient Medications  Medication Sig Dispense Refill   albuterol  (PROVENTIL ) (2.5 MG/3ML) 0.083% nebulizer solution USE 1 VIAL IN NEBULIZER EVERY 4 (FOUR) HOURS AS NEEDED FOR WHEEZING OR SHORTNESS OF BREATH. 120 mL 11   albuterol  (VENTOLIN  HFA) 108 (90 Base) MCG/ACT inhaler USE 2 INHALATIONS BY MOUTH EVERY 6 HOURS AS NEEDED FOR WHEEZING  OR SHORTNESS OF BREATH 54 g 3  atorvastatin  (LIPITOR) 40 MG tablet TAKE 1 TABLET BY MOUTH DAILY 100 tablet 2   budesonide -glycopyrrolate -formoterol  (BREZTRI  AEROSPHERE) 160-9-4.8 MCG/ACT AERO inhaler Inhale 2 puffs into the lungs in the morning and at bedtime. 2 each    clonazePAM  (KLONOPIN ) 1 MG tablet Take 1 tablet (1 mg total) by mouth 2 (two) times daily as needed. TAKES EVERY DAY 60 tablet 2   dexlansoprazole  (DEXILANT ) 60 MG capsule Take 1 capsule (60 mg total) by mouth daily. MUST SCHEDULE OFFICE VISIT 90 capsule 0   Difluprednate  (DUREZOL ) 0.05 % EMUL Apply 1 drop to eye 4 (four) times daily. 5 mL 0   fluticasone  (FLONASE ) 50 MCG/ACT nasal spray Place 2 sprays into both nostrils as needed. 48 mL 2   hydroxychloroquine  (PLAQUENIL ) 200 MG tablet Take 1 tablet (200 mg total) by mouth 2 (two) times daily. Home med.     Misc. Devices (PULSE OXIMETER DELUXE)  MISC Use daily to monitor oxygen  levels 1 each 0   Nebulizers (COMPRESSOR/NEBULIZER) MISC Supplies only 1 each 1   Nebulizers (COMPRESSOR/NEBULIZER) MISC Use as directed 1 each 0   nitroGLYCERIN  (NITROSTAT ) 0.4 MG SL tablet Place 1 tablet (0.4 mg total) under the tongue every 5 (five) minutes as needed for chest pain. 25 tablet 0   Respiratory Therapy Supplies (FLUTTER) DEVI 1 Device by Does not apply route daily. 1 each 0   Spacer/Aero-Holding Chambers (AEROCHAMBER MV) inhaler Use as instructed 1 each 0   No current facility-administered medications for this visit.    Allergies as of 01/14/2024 - Review Complete 01/14/2024  Allergen Reaction Noted   Alum & mag hydroxide-simeth Diarrhea and Nausea Only 11/02/2014   Bactrim  [sulfamethoxazole -trimethoprim ] Itching 01/17/2020   Cefdinir Other (See Comments) 07/06/2013   Chantix [varenicline]  06/11/2016   Chlorhexidine  gluconate Swelling 03/28/2022   Doxycycline  Other (See Comments) 11/18/2013   Fentanyl  Nausea And Vomiting 11/27/2022   Multaq  [dronedarone ] Other (See Comments) 07/22/2017    Family History  Problem Relation Age of Onset   Emphysema Father    Asthma Father    Heart disease Father    Heart attack Father    Arthritis Father        RA   Colon cancer Father    Hypertension Mother    Breast cancer Neg Hx     Social History   Socioeconomic History   Marital status: Married    Spouse name: Not on file   Number of children: 3   Years of education: Not on file   Highest education level: GED or equivalent  Occupational History   Occupation: Disabled    Comment: Social Security as of 05/21/2011  Tobacco Use   Smoking status: Every Day    Current packs/day: 1.00    Average packs/day: 1 pack/day for 20.0 years (20.0 ttl pk-yrs)    Types: Cigarettes    Passive exposure: Past   Smokeless tobacco: Never   Tobacco comments:    0.5 PPD -09/10/2023  Vaping Use   Vaping status: Never Used  Substance and Sexual  Activity   Alcohol use: No    Alcohol/week: 0.0 standard drinks of alcohol   Drug use: No   Sexual activity: Not Currently  Other Topics Concern   Not on file  Social History Narrative   Lives at home with husband. Independent at baseline.   Social Drivers of Health   Financial Resource Strain: Medium Risk (09/17/2023)   Overall Financial Resource Strain (CARDIA)    Difficulty of Paying Living Expenses: Somewhat  hard  Food Insecurity: No Food Insecurity (11/30/2023)   Hunger Vital Sign    Worried About Running Out of Food in the Last Year: Never true    Ran Out of Food in the Last Year: Never true  Transportation Needs: No Transportation Needs (11/30/2023)   PRAPARE - Administrator, Civil Service (Medical): No    Lack of Transportation (Non-Medical): No  Physical Activity: Insufficiently Active (09/17/2023)   Exercise Vital Sign    Days of Exercise per Week: 2 days    Minutes of Exercise per Session: 20 min  Stress: No Stress Concern Present (09/17/2023)   [employer] of Occupational Health - Occupational Stress Questionnaire    Feeling of Stress: Only a little  Social Connections: Moderately Integrated (09/17/2023)   Social Connection and Isolation Panel    Frequency of Communication with Friends and Family: More than three times a week    Frequency of Social Gatherings with Friends and Family: Once a week    Attends Religious Services: More than 4 times per year    Active Member of Clubs or Organizations: No    Attends Banker Meetings: Never    Marital Status: Married  Catering Manager Violence: Not At Risk (11/30/2023)   Humiliation, Afraid, Rape, and Kick questionnaire    Fear of Current or Ex-Partner: No    Emotionally Abused: No    Physically Abused: No    Sexually Abused: No     RELEVANT GI HISTORY, IMAGING AND LABS: CBC    Component Value Date/Time   WBC 8.3 12/01/2023 0305   RBC 4.21 12/01/2023 0305   HGB 12.1 12/01/2023 0305    HGB 14.2 11/17/2023 1454   HCT 36.8 12/01/2023 0305   HCT 43.3 11/17/2023 1454   PLT 236 12/01/2023 0305   PLT 316 11/17/2023 1454   MCV 87.4 12/01/2023 0305   MCV 89 11/17/2023 1454   MCV 85 06/19/2014 0847   MCH 28.7 12/01/2023 0305   MCHC 32.9 12/01/2023 0305   RDW 14.0 12/01/2023 0305   RDW 13.2 11/17/2023 1454   RDW 14.1 06/19/2014 0847   LYMPHSABS 2.4 01/09/2022 0958   LYMPHSABS 3.1 10/21/2013 1424   MONOABS 0.3 07/13/2021 1715   MONOABS 0.6 10/21/2013 1424   EOSABS 0.1 01/09/2022 0958   EOSABS 0.1 10/21/2013 1424   BASOSABS 0.1 01/09/2022 0958   BASOSABS 0.1 10/21/2013 1424   Recent Labs    02/27/23 0904 04/01/23 1745 11/17/23 1454 11/30/23 0836 12/01/23 0305  HGB 14.6 13.2 14.2 15.3* 12.1    CMP     Component Value Date/Time   NA 136 12/01/2023 0305   NA 140 11/17/2023 1454   NA 135 06/19/2014 0847   K 3.8 12/02/2023 0540   K 3.8 06/19/2014 0847   CL 105 12/01/2023 0305   CL 102 06/19/2014 0847   CO2 25 12/01/2023 0305   CO2 25 06/19/2014 0847   GLUCOSE 80 12/01/2023 0305   GLUCOSE 113 (H) 06/19/2014 0847   BUN 11 12/01/2023 0305   BUN 13 11/17/2023 1454   BUN 14 06/19/2014 0847   CREATININE 0.77 12/01/2023 0305   CREATININE 0.70 11/18/2016 1502   CALCIUM  7.9 (L) 12/01/2023 0305   CALCIUM  8.9 06/19/2014 0847   PROT 5.6 (L) 12/01/2023 0305   PROT 6.7 11/17/2023 1454   PROT 7.8 01/17/2014 2048   ALBUMIN  2.8 (L) 12/01/2023 0305   ALBUMIN  4.0 11/17/2023 1454   ALBUMIN  3.6 01/17/2014 2048   AST 16 12/01/2023  0305   AST 18 01/17/2014 2048   ALT 14 12/01/2023 0305   ALT 26 01/17/2014 2048   ALKPHOS 65 12/01/2023 0305   ALKPHOS 114 01/17/2014 2048   BILITOT 0.3 12/01/2023 0305   BILITOT 0.4 11/17/2023 1454   BILITOT 0.3 01/17/2014 2048   GFRNONAA >60 12/01/2023 0305   GFRNONAA 99 11/18/2016 1502   GFRAA 104 06/08/2019 0727   GFRAA 115 11/18/2016 1502      Latest Ref Rng & Units 12/01/2023    3:05 AM 11/30/2023    8:36 AM 11/17/2023    2:54 PM   Hepatic Function  Total Protein 6.5 - 8.1 g/dL 5.6  7.5  6.7   Albumin  3.5 - 5.0 g/dL 2.8  3.7  4.0   AST 15 - 41 U/L 16  17  14    ALT 0 - 44 U/L 14  12  10    Alk Phosphatase 38 - 126 U/L 65  73  90   Total Bilirubin 0.0 - 1.2 mg/dL 0.3  1.1  0.4       Review of Systems   All systems reviewed and negative except where noted in HPI.    Physical Exam  BP 128/87   Pulse 73   Temp 97.7 F (36.5 C)   Ht 5' 5 (1.651 m)   Wt 126 lb 6.4 oz (57.3 kg)   SpO2 100%   BMI 21.03 kg/m  No LMP recorded. Patient has had a hysterectomy. General:   Alert and oriented. Pleasant and cooperative. Well-nourished and well-developed. In no acute distress. Head:  Normocephalic and atraumatic. Eyes:  Without icterus Ears:  Normal auditory acuity. Neck:  Supple; no masses or thyromegaly. Lungs:  Respirations even and unlabored.  Clear throughout to auscultation.   No wheezes, crackles, or rhonchi. No acute distress. Heart:  Regular rate and rhythm; no murmurs, clicks, rubs, or gallops. Abdomen:  Normal bowel sounds.  No bruits.  Soft, non-tender and non-distended without masses, hepatosplenomegaly or hernias noted.  No guarding or rebound tenderness.   Rectal:  Deferred. Msk:  Symmetrical without gross deformities. Normal posture. Extremities:  Without edema. Neurologic:  Alert and  oriented x4;  grossly normal neurologically. Skin:  Intact without significant lesions or rashes. Psych:  Alert and cooperative. Normal mood and affect.   Assessment & Plan   JOSSELYNE ONOFRIO is a 58 y.o. female presenting today with constipation.   Constipation with intermittent abd pain. Miralax  not helping.  - increase fiber and water - Given samples of linzess 72, 145, and 290 mcg. If this works she will let us  know which dose is best.   GERD. Continues to have foamy acid that comes up a couple times a week - continue lifestyle modifications - refilled dexilant  60mg  once daily - upon further review of  notes, Dr. Jordis had recommended gastric emptying studying in 12/2022 due to possible functional GI issue. Patient did not have this done. She is agreeable now and will get her scheduled.     Follow up in 6 weeks  Grayce Bohr, DNP, AGNP-C [facility]

## 2024-01-14 ENCOUNTER — Encounter: Payer: Self-pay | Admitting: Family Medicine

## 2024-01-14 ENCOUNTER — Ambulatory Visit (INDEPENDENT_AMBULATORY_CARE_PROVIDER_SITE_OTHER): Admitting: Family Medicine

## 2024-01-14 VITALS — BP 128/87 | HR 73 | Temp 97.7°F | Ht 65.0 in | Wt 126.4 lb

## 2024-01-14 DIAGNOSIS — K5909 Other constipation: Secondary | ICD-10-CM

## 2024-01-14 DIAGNOSIS — K219 Gastro-esophageal reflux disease without esophagitis: Secondary | ICD-10-CM | POA: Diagnosis not present

## 2024-01-15 MED ORDER — DEXLANSOPRAZOLE 60 MG PO CPDR: 60.0000 mg | DELAYED_RELEASE_CAPSULE | Freq: Every day | ORAL | 3 refills | Status: AC

## 2024-01-15 NOTE — Patient Instructions (Addendum)
 Gastric Empty scheduled @ [facility] 02/13/24 arrive @ 8:30  -nothing to eat/drink after midnight.  [facility] entrance. Hold dexilant  8 hours prior to test. LVM for patient to return call.

## 2024-01-16 ENCOUNTER — Ambulatory Visit: Admitting: Pulmonary Disease

## 2024-01-31 ENCOUNTER — Telehealth: Admitting: Nurse Practitioner

## 2024-01-31 DIAGNOSIS — J019 Acute sinusitis, unspecified: Secondary | ICD-10-CM

## 2024-01-31 DIAGNOSIS — B9689 Other specified bacterial agents as the cause of diseases classified elsewhere: Secondary | ICD-10-CM | POA: Diagnosis not present

## 2024-01-31 MED ORDER — AZITHROMYCIN 250 MG PO TABS: ORAL_TABLET | ORAL | 0 refills | Status: AC

## 2024-01-31 NOTE — Progress Notes (Signed)
 E-Visit for Sinus Problems  We are sorry that you are not feeling well.  Here is how we plan to help!  Based on what you have shared with me it looks like you have sinusitis.  Sinusitis is inflammation and infection in the sinus cavities of the head.  Based on your presentation I believe you most likely have Acute Bacterial Sinusitis.  This is an infection caused by bacteria and is treated with antibiotics. I have prescribed a zpak as requested although they are not considered the standard treatment for sinusitis. Would recommend holding off on steroids at this time.    You may use an oral decongestant such as Mucinex  D or if you have glaucoma or high blood pressure use plain Mucinex . Saline nasal spray help and can safely be used as often as needed for congestion.  If you develop worsening sinus pain, fever or notice severe headache and vision changes, or if symptoms are not better after completion of antibiotic, please schedule an appointment with a health care provider.    Sinus infections are not as easily transmitted as other respiratory infection, however we still recommend that you avoid close contact with loved ones, especially the very young and elderly.  Remember to wash your hands thoroughly throughout the day as this is the number one way to prevent the spread of infection!  Home Care: Only take medications as instructed by your medical team. Complete the entire course of an antibiotic. Do not take these medications with alcohol. A steam or ultrasonic humidifier can help congestion.  You can place a towel over your head and breathe in the steam from hot water coming from a faucet. Avoid close contacts especially the very young and the elderly. Cover your mouth when you cough or sneeze. Always remember to wash your hands.  Get Help Right Away If: You develop worsening fever or sinus pain. You develop a severe head ache or visual changes. Your symptoms persist after you have  completed your treatment plan.  Make sure you Understand these instructions. Will watch your condition. Will get help right away if you are not doing well or get worse.  Your e-visit answers were reviewed by a board certified advanced clinical practitioner to complete your personal care plan.  Depending on the condition, your plan could have included both over the counter or prescription medications.  If there is a problem please reply  once you have received a response from your provider.  Your safety is important to us .  If you have drug allergies check your prescription carefully.    You can use MyChart to ask questions about today's visit, request a non-urgent call back, or ask for a work or school excuse for 24 hours related to this e-Visit. If it has been greater than 24 hours you will need to follow up with your provider, or enter a new e-Visit to address those concerns.  You will get an e-mail in the next two days asking about your experience.  I hope that your e-visit has been valuable and will speed your recovery. Thank you for using e-visits.  I have spent 5 minutes in review of e-visit questionnaire, review and updating patient chart, medical decision making and response to patient.   Merryn Thaker W Atzel Mccambridge, NP

## 2024-02-07 ENCOUNTER — Other Ambulatory Visit: Payer: Self-pay | Admitting: Pulmonary Disease

## 2024-02-13 ENCOUNTER — Other Ambulatory Visit: Payer: Self-pay | Admitting: Family Medicine

## 2024-02-13 ENCOUNTER — Ambulatory Visit: Admitting: Pulmonary Disease

## 2024-02-13 ENCOUNTER — Ambulatory Visit

## 2024-02-13 ENCOUNTER — Encounter: Payer: Self-pay | Admitting: Pulmonary Disease

## 2024-02-13 VITALS — BP 102/72 | HR 71 | Temp 97.7°F | Ht 65.0 in | Wt 124.8 lb

## 2024-02-13 DIAGNOSIS — F17210 Nicotine dependence, cigarettes, uncomplicated: Secondary | ICD-10-CM

## 2024-02-13 DIAGNOSIS — J4489 Other specified chronic obstructive pulmonary disease: Secondary | ICD-10-CM

## 2024-02-13 DIAGNOSIS — R0902 Hypoxemia: Secondary | ICD-10-CM | POA: Diagnosis not present

## 2024-02-13 DIAGNOSIS — J449 Chronic obstructive pulmonary disease, unspecified: Secondary | ICD-10-CM

## 2024-02-13 MED ORDER — BREZTRI AEROSPHERE 160-9-4.8 MCG/ACT IN AERO: 2.0000 | INHALATION_SPRAY | Freq: Two times a day (BID) | RESPIRATORY_TRACT | 3 refills | Status: AC

## 2024-02-13 NOTE — Progress Notes (Signed)
 Subjective:    Patient ID: Andrea [name], female    DOB: 11/13/1965, 58 y.o.   MRN: [id number]  Patient Care Team: [name], MD as PCP - General (Family Medicine) Tamea Dedra LITTIE, MD as Consulting Physician (Pulmonary Disease) [facility], [facility])  Chief Complaint  Patient presents with   COPD    Occasional cough, shortness of breath and wheezing, with weather change.     BACKGROUND/INTERVAL:Andrea [name] is a 58 year old current smoker (half PPD, 20 PY) with stage 2 COPD and a history as noted below, who presents for follow-up on the same. This is a scheduled visit.  He was last seen 10 September 2023.   HPI Discussed the use of AI scribe software for clinical note transcription with the patient, who gave verbal consent to proceed.  History of Present Illness   The patient is a 58 year old current smoker with stage II moderate COPD and asthmatic bronchitis who presents for follow-up.  She is experiencing issues with her Breztri  inhaler, stating that it 'won't work' and 'no medicine coming out.' She has returned it to the pharmacy and contacted the company, but has not yet received a resolution. This is the second occurrence of this issue, leading to being shorted on refills.  She was instructed on proper maintenance of the inhaler.  This inhaler does require rinsing off and also once weekly to keep the nozzle patent.  Otherwise, the inhaler may clog and not dispense medication.  She continues to smoke, and her breathing is affected by weather changes, which she describes as 'aggravating.'  In September, she was hospitalized for enteritis, causing severe abdominal pain and requiring morphine  for relief. She experienced symptoms for a week before seeking hospital care.  She was admitted for 2 days.  She did not have a COPD exacerbation during that admission.  She has nocturnal hypoxemia and is compliant with oxygen  therapy.  She unfortunately continues to smoke half a pack  of cigarettes per day.  She was counseled regards to discontinuation of smoking.    DATA 06/18/2016 PFTs: FEV1 1.45 L or 60% predicted VC 2.38 L or 90% predicted FEV1/FVC 61%.  Lung volumes: TLC 102%, RV 140%. DLCO 57%.  No significant bronchodilator response. 09/25/2016 2D echo: LVEF 60 to 65%, normal study. 06/08/2019 alpha 1 antitrypsin: Phenotype MM, level 150 mg/dL (normal). 09/01/2020 overnight oximetry: No significant desaturations. 01/15/2021 PFTs: FEV1 1.46 L or 57% predicted, FVC 2.31 L or 70% predicted, FEV1/FVC 63%, no bronchodilator response. TLC 113, RV 200% diffusion capacity moderately reduced. 06/11/2021 echocardiogram: LVEF 60 to 65%, no regional wall motion abnormality, diastolic send normal, right heart function normal.  No valvular abnormalities. 07/22/2023 CXR PA and lateral: Hyperinflation otherwise no other abnormalities. 09/02/2023 PFTs: FEV1 1.39 L or 56% predicted, FVC 2.29 L or 71% predicted, FEV1/FVC 61%, no significant bronchodilator response.  There is mild hyperinflation and moderate air trapping noted on lung volumes.  Diffusion capacity mildly reduced consistent with moderate to severe obstructive airways disease with mild diffusion defect.  No significant change from prior study performed 15 January 2021.    Review of Systems A 10 point review of systems was performed and it is as noted above otherwise negative.   Patient Active Problem List   Diagnosis Date Noted   Enteritis of small bowel 11/30/2023   Hiatal hernia 03/28/2022   S/P repair of paraesophageal hernia 03/28/2022   Chronic anterior uveitis of left eye 10/04/2021   Varicose veins of bilateral lower  extremities with pain 11/22/2016   Restless leg 11/18/2016   Iron deficiency 11/18/2016   Allergic rhinitis 06/11/2016   Hyperlipidemia 03/19/2015   Depression 09/22/2014   Dyshidrosis 09/22/2014   GERD (gastroesophageal reflux disease) 09/22/2014   Insomnia 09/22/2014   Panic disorder  09/22/2014   Palpitations 01/17/2014   Shortness of breath 05/20/2013   COPD, GOLD B 05/20/2013   Tobacco abuse 05/20/2013   Daytime somnolence 05/20/2013   Back pain 08/28/2012   Discoid lupus 05/30/2012   Pain in joint 03/09/2012   Psoriasis 02/21/2012   Fibromyalgia 02/11/2012   Lupus 02/11/2012   History of adenomatous polyp of colon 09/13/2011   Heartburn 08/06/2011   PVC's (premature ventricular contractions) 01/29/2011   Essential (primary) hypertension 03/04/1998    Social History   Tobacco Use   Smoking status: Every Day    Current packs/day: 0.50    Average packs/day: 1 pack/day for 41.0 years (40.5 ttl pk-yrs)    Types: Cigarettes    Start date: 1985    Passive exposure: Past   Smokeless tobacco: Never  Substance Use Topics   Alcohol use: No    Alcohol/week: 0.0 standard drinks of alcohol    Allergies[1]  Active Medications[2]  Immunization History  Administered Date(s) Administered   Moderna Sars-Covid-2 Vaccination 11/28/2019, 12/26/2019   Pneumococcal Conjugate-13 11/20/2012   Pneumococcal Polysaccharide-23 08/10/2012        Objective:     Vitals:   02/13/24 1120  BP: 102/72  Pulse: 71  Temp: 97.7 F (36.5 C)  Height: 5' 5 (1.651 m)  Weight: 124 lb 12.8 oz (56.6 kg)  SpO2: 97%  TempSrc: Temporal  BMI (Calculated): 20.77     SpO2: 97 %  GENERAL: Awake, alert, fully ambulatory.  No respiratory distress.  No conversational dyspnea.   HEAD: Normocephalic, atraumatic.  Significant maxillary sinus tenderness on palpation. EARS: Mild serous otitis bilaterally.  No purulent effusion. EYES: Pupils equal, round, reactive to light.  No scleral icterus.  MOUTH: Dentures upper and lower. NECK: Supple. No thyromegaly. Trachea midline. No JVD.  No adenopathy. PULMONARY: Good air entry bilaterally.  There are coarse breath sounds throughout, no wheezes. CARDIOVASCULAR: S1 and S2. Regular rate and rhythm.  No rubs, murmurs or gallops  heard. ABDOMEN: Benign. MUSCULOSKELETAL: No joint deformity, no clubbing, no edema.  NEUROLOGIC: No overt focal deficit.  Speech is fluent.  No gait disturbance. SKIN: Intact,warm,dry. PSYCH: Normal mood and behavior.       Assessment & Plan:     ICD-10-CM   1. Stage 2 moderate COPD by GOLD classification (HCC)  J44.9     2. Nocturnal hypoxemia due to obstructive chronic bronchitis (HCC)  J44.89    G47.36     3. Tobacco dependence due to cigarettes  F17.210      Meds ordered this encounter  Medications   budesonide -glycopyrrolate -formoterol  (BREZTRI  AEROSPHERE) 160-9-4.8 MCG/ACT AERO inhaler    Sig: Inhale 2 puffs into the lungs in the morning and at bedtime.    Dispense:  10.7 g    Refill:  3    Lot Number?:   [id number]    Expiration Date?:   12/02/2025    NDC:   9689-5383-71 [661259]    Quantity:   2   Discussion:    Chronic obstructive pulmonary disease with asthmatic bronchitis Stage 2 moderate COPD with asthmatic bronchitis. Reports issues with inhaler malfunction, specifically with the Breztri  inhaler, which has not been functioning properly. Continues to smoke despite medical advice, which may exacerbate  her condition. - Provided samples of Breztri  inhaler - Instructed to rinse inhaler nozzle weekly under warm water to prevent clogging - Scheduled follow-up in 4 months  Nocturnal hypoxemia secondary to COPD Nocturnal hypoxemia likely secondary to COPD. No specific discussion of current management or symptoms related to nocturnal hypoxemia in this encounter.  Current tobacco use Continues to smoke despite medical advice. Smoking cessation is crucial for managing COPD and preventing further respiratory complications.      Smoking/Tobacco Cessation Counseling Andrea [name] is a current user of tobacco or nicotine  products. She is not ready to quit at this time. Counseling provided today addressed the risks of continued use and the benefits of cessation. Discussed  tobacco/nicotine  use history, readiness to quit, and evidence-based treatment options including behavioral strategies, support resources, and pharmacologic therapies. Provided encouragement and educational materials on steps and resources to quit smoking. Patient questions were addressed, and follow-up recommended for continued support. Total time spent on counseling: 5 minutes.   Advised if symptoms do not improve or worsen, to please contact office for sooner follow up or seek emergency care.    I spent 33 minutes of dedicated to the care of this patient on the date of this encounter to include pre-visit review of records, face-to-face time with the patient discussing conditions above, post visit ordering of testing, clinical documentation with the electronic health record, making appropriate referrals as documented, and communicating necessary findings to members of the patients care team.     C. Leita Sanders, MD Advanced Bronchoscopy PCCM [facility]    *This note was generated using voice recognition software/Dragon and/or AI transcription program.  Despite best efforts to proofread, errors can occur which can change the meaning. Any transcriptional errors that result from this process are unintentional and may not be fully corrected at the time of dictation.     [1]  Allergies Allergen Reactions   Alum & Mag Hydroxide-Simeth Diarrhea and Nausea Only   Bactrim  [Sulfamethoxazole -Trimethoprim ] Itching   Cefdinir Other (See Comments)    tachycardia   Chantix [Varenicline]     Bad dreams   Chlorhexidine  Gluconate Swelling   Doxycycline  Other (See Comments)    Nausea, migraine   Fentanyl  Nausea And Vomiting   Multaq  [Dronedarone ] Other (See Comments)    Lip/ mouth numbness/ tongue swelling  [2]  Current Meds  Medication Sig   albuterol  (PROVENTIL ) (2.5 MG/3ML) 0.083% nebulizer solution USE 1 VIAL IN NEBULIZER EVERY 4 (FOUR) HOURS AS NEEDED FOR WHEEZING OR  SHORTNESS OF BREATH.   albuterol  (VENTOLIN  HFA) 108 (90 Base) MCG/ACT inhaler TAKE 2 PUFFS BY MOUTH EVERY 6 HOURS AS NEEDED FOR WHEEZE OR SHORTNESS OF BREATH   atorvastatin  (LIPITOR) 40 MG tablet TAKE 1 TABLET BY MOUTH DAILY   clonazePAM  (KLONOPIN ) 1 MG tablet TAKE 1 TABLET (1 MG TOTAL) BY MOUTH 2 (TWO) TIMES DAILY AS NEEDED. TAKES EVERY DAY   dexlansoprazole  (DEXILANT ) 60 MG capsule Take 1 capsule (60 mg total) by mouth daily. MUST SCHEDULE OFFICE VISIT   Difluprednate  (DUREZOL ) 0.05 % EMUL Apply 1 drop to eye 4 (four) times daily.   fluticasone  (FLONASE ) 50 MCG/ACT nasal spray Place 2 sprays into both nostrils as needed.   hydroxychloroquine  (PLAQUENIL ) 200 MG tablet Take 1 tablet (200 mg total) by mouth 2 (two) times daily. Home med.   Misc. Devices (PULSE OXIMETER DELUXE) MISC Use daily to monitor oxygen  levels   Nebulizers (COMPRESSOR/NEBULIZER) MISC Use as directed   nitroGLYCERIN  (NITROSTAT ) 0.4 MG SL tablet Place 1  tablet (0.4 mg total) under the tongue every 5 (five) minutes as needed for chest pain.   Respiratory Therapy Supplies (FLUTTER) DEVI 1 Device by Does not apply route daily.   Spacer/Aero-Holding Chambers (AEROCHAMBER MV) inhaler Use as instructed   [DISCONTINUED] budesonide -glycopyrrolate -formoterol  (BREZTRI  AEROSPHERE) 160-9-4.8 MCG/ACT AERO inhaler Inhale 2 puffs into the lungs in the morning and at bedtime.

## 2024-02-13 NOTE — Patient Instructions (Signed)
 VISIT SUMMARY:  You came in today for a follow-up appointment regarding your COPD and asthmatic bronchitis. We discussed the issues you are having with your Breztri  inhaler and your continued smoking. We also reviewed your recent hospitalization for enteritis and your experience with nocturnal hypoxemia.  YOUR PLAN:  -CHRONIC OBSTRUCTIVE PULMONARY DISEASE WITH ASTHMATIC BRONCHITIS: COPD with asthmatic bronchitis is a chronic lung condition that makes it hard to breathe. You reported issues with your Breztri  inhaler not working properly. We provided you with samples of the Breztri  inhaler and instructed you to rinse the inhaler nozzle weekly under warm water to prevent clogging. We will follow up in 4 months to check on your progress.  -NOCTURNAL HYPOXEMIA SECONDARY TO COPD: Nocturnal hypoxemia means low oxygen  levels during sleep, which is likely due to your COPD.  Continue using your oxygen  as previously prescribed.  -CURRENT TOBACCO USE: You continue to smoke despite medical advice. Smoking cessation is crucial for managing your COPD and preventing further respiratory complications. We strongly recommend that you quit smoking to improve your lung health.  INSTRUCTIONS:  Please follow up in 4 months for your COPD and asthmatic bronchitis. Make sure to rinse your Breztri  inhaler nozzle weekly under warm water to prevent clogging. Consider quitting smoking to help manage your COPD and improve your overall health.

## 2024-02-16 ENCOUNTER — Encounter: Payer: Self-pay | Admitting: Pulmonary Disease

## 2024-02-23 ENCOUNTER — Ambulatory Visit: Admitting: Family Medicine

## 2024-03-07 ENCOUNTER — Telehealth: Admitting: Nurse Practitioner

## 2024-03-07 DIAGNOSIS — B9689 Other specified bacterial agents as the cause of diseases classified elsewhere: Secondary | ICD-10-CM | POA: Diagnosis not present

## 2024-03-07 DIAGNOSIS — J019 Acute sinusitis, unspecified: Secondary | ICD-10-CM | POA: Diagnosis not present

## 2024-03-07 MED ORDER — AZITHROMYCIN 250 MG PO TABS: ORAL_TABLET | ORAL | 0 refills | Status: AC

## 2024-03-07 MED ORDER — PREDNISONE 20 MG PO TABS: 20.0000 mg | ORAL_TABLET | Freq: Every day | ORAL | 0 refills | Status: DC

## 2024-03-07 NOTE — Progress Notes (Signed)
"      E-Visit for Sinus Problems  We are sorry that you are not feeling well.  Here is how we plan to help!  Based on what you have shared with me it looks like you have sinusitis.  Sinusitis is inflammation and infection in the sinus cavities of the head.  Based on your presentation I believe you most likely have Acute Bacterial Sinusitis.  This is an infection caused by bacteria and is treated with antibiotics. I have prescribed prednisone  and zpak. You may use an oral decongestant such as Mucinex  D or if you have glaucoma or high blood pressure use plain Mucinex . Saline nasal spray help and can safely be used as often as needed for congestion.  If you develop worsening sinus pain, fever or notice severe headache and vision changes, or if symptoms are not better after completion of antibiotic, please schedule an appointment with a health care provider.    Sinus infections are not as easily transmitted as other respiratory infection, however we still recommend that you avoid close contact with loved ones, especially the very young and elderly.  Remember to wash your hands thoroughly throughout the day as this is the number one way to prevent the spread of infection!  Home Care: Only take medications as instructed by your medical team. Complete the entire course of an antibiotic. Do not take these medications with alcohol. A steam or ultrasonic humidifier can help congestion.  You can place a towel over your head and breathe in the steam from hot water coming from a faucet. Avoid close contacts especially the very young and the elderly. Cover your mouth when you cough or sneeze. Always remember to wash your hands.  Get Help Right Away If: You develop worsening fever or sinus pain. You develop a severe head ache or visual changes. Your symptoms persist after you have completed your treatment plan.  Make sure you Understand these instructions. Will watch your condition. Will get help right away  if you are not doing well or get worse.  Your e-visit answers were reviewed by a board certified advanced clinical practitioner to complete your personal care plan.  Depending on the condition, your plan could have included both over the counter or prescription medications.  If there is a problem please reply  once you have received a response from your provider.  Your safety is important to us .  If you have drug allergies check your prescription carefully.    You can use MyChart to ask questions about todays visit, request a non-urgent call back, or ask for a work or school excuse for 24 hours related to this e-Visit. If it has been greater than 24 hours you will need to follow up with your provider, or enter a new e-Visit to address those concerns.  You will get an e-mail in the next two days asking about your experience.  I hope that your e-visit has been valuable and will speed your recovery. Thank you for using e-visits.  I have spent 5 minutes in review of e-visit questionnaire, review and updating patient chart, medical decision making and response to patient.   Haze LELON Servant, NP     "

## 2024-03-11 ENCOUNTER — Other Ambulatory Visit

## 2024-03-12 ENCOUNTER — Ambulatory Visit

## 2024-03-24 ENCOUNTER — Ambulatory Visit: Payer: Self-pay

## 2024-03-24 ENCOUNTER — Encounter: Payer: Self-pay | Admitting: General Practice

## 2024-03-24 ENCOUNTER — Telehealth: Admitting: General Practice

## 2024-03-24 DIAGNOSIS — J449 Chronic obstructive pulmonary disease, unspecified: Secondary | ICD-10-CM

## 2024-03-24 DIAGNOSIS — J0140 Acute pansinusitis, unspecified: Secondary | ICD-10-CM | POA: Diagnosis not present

## 2024-03-24 MED ORDER — AMOXICILLIN-POT CLAVULANATE 875-125 MG PO TABS: 1.0000 | ORAL_TABLET | Freq: Two times a day (BID) | ORAL | 0 refills | Status: AC

## 2024-03-24 NOTE — Progress Notes (Signed)
 "  Virtual Visit via Video Note  I connected with Andrea [name] on 03/24/24 at  3:00 PM EST by a video enabled telemedicine application and verified that I am speaking with the correct person using two identifiers.  Patient Location: Home Provider Location: Home Office  I discussed the limitations, risks, security, and privacy concerns of performing an evaluation and management service by video and the availability of in person appointments. I also discussed with the patient that there may be a patient responsible charge related to this service. The patient expressed understanding and agreed to proceed.  Subjective: PCP: [name], MD  Chief Complaint  Patient presents with   Cough    she has a deep cough, headaches, ears are very painful, face is very extremely painful around her eyes and nose. Wheezing, and eyes watering really bad. Patient also has yellow mucus when blowing her nose; before the mucus was clear. Sx have been present 5 days. Patient has taken mucinex , claritin  and flonase . Patient did covid test about 3 days ago and was negative.    Cough   Discussed the use of AI scribe software for clinical note transcription with the patient, who gave verbal consent to proceed.  History of Present Illness Andrea [name] is a 59 year old female, patient of Dr. Gasper, with COPD who presents with sinus congestion and ear pain.  She has been experiencing sinus congestion and ear pain for five days, with significant pain in both ears. The symptoms began around Saturday or Sunday. She describes a cough that she considers typical for her COPD, but her primary concern is the sinus congestion and ear pain.  She has been blowing her nose frequently, resulting in soreness around the nasal area and sinus pressure on both sides of her face. Initially, her nasal discharge was clear but has since turned yellow. She notes a slight improvement in symptoms since the weekend.  A COVID test  performed three days ago was negative. She has been taking Mucinex , Claritin , and Flonase , but only experienced temporary relief from Mucinex . She uses Flonase  by tilting her head back and swallowing half of it, which she finds ineffective.  She experienced chills on the first day of symptoms but has had no fever, nausea, vomiting, or diarrhea.   She is using albuterol , Ventolin , a nebulizer, and Breztri  for her COPD.     ROS: Per HPI Current Medications[1]  Observations/Objective: There were no vitals filed for this visit. Physical Exam Vitals and nursing note reviewed.  Constitutional:      Appearance: Normal appearance.  Cardiovascular:     Rate and Rhythm: Normal rate and regular rhythm.     Pulses: Normal pulses.     Heart sounds: Normal heart sounds.  Pulmonary:     Effort: Pulmonary effort is normal.     Breath sounds: Normal breath sounds.  Neurological:     Mental Status: She is alert and oriented to person, place, and time.  Psychiatric:        Mood and Affect: Mood normal.        Behavior: Behavior normal.        Thought Content: Thought content normal.        Judgment: Judgment normal.     Assessment and Plan: Acute pansinusitis, recurrence not specified -     Amoxicillin -Pot Clavulanate; Take 1 tablet by mouth 2 (two) times daily for 7 days.  Dispense: 14 tablet; Refill: 0   Assessment and Plan Assessment &  Plan Acute pansinusitis Sinus pressure, nasal congestion, and ear pain for five days. Symptoms slightly improved. Risk of progression to bronchitis due to COPD. - Prescribed Augmentin  1 tablet twice a day for 7 days. Take with food to prevent diarrhea. - Continue Flonase  twice daily, morning and evening, with proper technique to avoid swallowing. - Continue Claritin  for dryness and eye watering. - Increase fluid intake and use a cool mist humidifier at night. - Use Tylenol  for ear pain. - Seek office visit if symptoms worsen or do not improve after  completing Augmentin .  Chronic obstructive pulmonary disease (COPD) COPD with current mild cough. Risk of bronchitis if sinus infection is not treated. - Continue using albuterol , Ventolin , nebulizer, and Breztri  inhalers as prescribed. - Monitor for worsening symptoms such as shortness of breath, chest pain, or difficulty breathing, which may require ER visit for IV antibiotics and steroids.    Follow Up Instructions: Return if symptoms worsen or fail to improve.   I discussed the assessment and treatment plan with the patient. The patient was provided an opportunity to ask questions, and all were answered. The patient agreed with the plan and demonstrated an understanding of the instructions.   The patient was advised to call back or seek an in-person evaluation if the symptoms worsen or if the condition fails to improve as anticipated.  The above assessment and management plan was discussed with the patient. The patient verbalized understanding of and has agreed to the management plan.   Carrol Aurora, NP     [1]  Current Outpatient Medications:    albuterol  (PROVENTIL ) (2.5 MG/3ML) 0.083% nebulizer solution, USE 1 VIAL IN NEBULIZER EVERY 4 (FOUR) HOURS AS NEEDED FOR WHEEZING OR SHORTNESS OF BREATH., Disp: 120 mL, Rfl: 11   albuterol  (VENTOLIN  HFA) 108 (90 Base) MCG/ACT inhaler, TAKE 2 PUFFS BY MOUTH EVERY 6 HOURS AS NEEDED FOR WHEEZE OR SHORTNESS OF BREATH, Disp: 8.5 each, Rfl: 6   amoxicillin -clavulanate (AUGMENTIN ) 875-125 MG tablet, Take 1 tablet by mouth 2 (two) times daily for 7 days., Disp: 14 tablet, Rfl: 0   atorvastatin  (LIPITOR) 40 MG tablet, TAKE 1 TABLET BY MOUTH DAILY, Disp: 100 tablet, Rfl: 2   budesonide -glycopyrrolate -formoterol  (BREZTRI  AEROSPHERE) 160-9-4.8 MCG/ACT AERO inhaler, Inhale 2 puffs into the lungs in the morning and at bedtime., Disp: 10.7 g, Rfl: 3   clonazePAM  (KLONOPIN ) 1 MG tablet, TAKE 1 TABLET (1 MG TOTAL) BY MOUTH 2 (TWO) TIMES DAILY AS NEEDED. TAKES  EVERY DAY, Disp: 60 tablet, Rfl: 2   dexlansoprazole  (DEXILANT ) 60 MG capsule, Take 1 capsule (60 mg total) by mouth daily. MUST SCHEDULE OFFICE VISIT, Disp: 90 capsule, Rfl: 3   Difluprednate  (DUREZOL ) 0.05 % EMUL, Apply 1 drop to eye 4 (four) times daily., Disp: 5 mL, Rfl: 0   fluticasone  (FLONASE ) 50 MCG/ACT nasal spray, Place 2 sprays into both nostrils as needed., Disp: 48 mL, Rfl: 2   hydroxychloroquine  (PLAQUENIL ) 200 MG tablet, Take 1 tablet (200 mg total) by mouth 2 (two) times daily. Home med., Disp: , Rfl:    Misc. Devices (PULSE OXIMETER DELUXE) MISC, Use daily to monitor oxygen  levels, Disp: 1 each, Rfl: 0   Nebulizers (COMPRESSOR/NEBULIZER) MISC, Use as directed, Disp: 1 each, Rfl: 0   nitroGLYCERIN  (NITROSTAT ) 0.4 MG SL tablet, Place 1 tablet (0.4 mg total) under the tongue every 5 (five) minutes as needed for chest pain., Disp: 25 tablet, Rfl: 0   Respiratory Therapy Supplies (FLUTTER) DEVI, 1 Device by Does not apply route  daily., Disp: 1 each, Rfl: 0   Spacer/Aero-Holding Chambers (AEROCHAMBER MV) inhaler, Use as instructed, Disp: 1 each, Rfl: 0  "

## 2024-03-24 NOTE — Patient Instructions (Signed)
 Start Augmentin  antibiotics. Take 1 tablet by mouth twice daily for 7 days.  Continue flonase  and claritin .   You can try a few things over the counter to help with your symptoms including:  Cough: Delsym or Robitussin (get the off brand, works just as well) Chest Congestion: Mucinex  (plain) Nasal Congestion/Ear Pressure/Sinus Pressure: Try using Flonase  (fluticasone ) nasal spray. Instill 1 spray in each nostril twice daily. This can be purchased over the counter. Body aches, fevers, headache: Ibuprofen  (not to exceed 2400 mg in 24 hours) or Acetaminophen -Tylenol  (not to exceed 3000 mg in 24 hours) Runny Nose/Throat Drainage/Sneezing/Itchy or Watery Eyes: An antihistamine such as Zyrtec, Claritin , Xyzal, Allegra  Increase fluid intake, rest and use cool-mist humidifier.   Follow up if symptoms do not improve.  Please go to the ER if symptoms worsen as discussed.   It was a pleasure meeting you!

## 2024-03-24 NOTE — Telephone Encounter (Signed)
 FYI Only or Action Required?: FYI only for provider: appointment scheduled on 1/21.  Patient was last seen in primary care on 12/05/2023 by [name], MD.  Called Nurse Triage reporting Cough.  Symptoms began several days ago.  Interventions attempted: OTC medications: Mucinex , Flonase  .  Symptoms are: unchanged.  Triage Disposition: See HCP Within 4 Hours (Or PCP Triage)  Patient/caregiver understands and will follow disposition?: Yes     Message from [facility] C sent at 03/24/2024  2:11 PM EST  Reason for Triage: Patient states she has a deep cough, headaches, ears are very painful,face is very swollen and extremely painful around her eyes and nose. Wheezing, and eyes watering really bad. And yellow mucus  when blowing her nose.   Reason for Disposition  Wheezing is present  Answer Assessment - Initial Assessment Questions 1. ONSET: When did the cough begin?      X 5 day  2. SEVERITY: How bad is the cough today?      Deep   3. SPUTUM: Describe the color of your sputum (e.g., none, dry cough; clear, white, yellow, green)     Yellow   4. HEMOPTYSIS: Are you coughing up any blood? If Yes, ask: How much? (e.g., flecks, streaks, tablespoons, etc.)     No   5. DIFFICULTY BREATHING: Are you having difficulty breathing? If Yes, ask: How bad is it? (e.g., mild, moderate, severe)      No   6. FEVER: Do you have a fever? If Yes, ask: What is your temperature, how was it measured, and when did it start?     No   7. CARDIAC HISTORY: Do you have any history of heart disease? (e.g., heart attack, congestive heart failure)       No   8. LUNG HISTORY: Do you have any history of lung disease?  (e.g., pulmonary embolus, asthma, emphysema)     COPD- using inhaler and home oxygen .    10. OTHER SYMPTOMS: Do you have any other symptoms? (e.g., runny nose, wheezing, chest pain)       Wheezing, headache, Ear pain.      Patient called in to triage with  complaints of productive cough, wheezing  This has been ongoing for 5 days.  For home care, the patient is taking Mucinex , Flonase    Video Appointment scheduled for further evaluation at alternative location [facility]; patient does not wish to come in office. Patient agrees with the plan of care, and will reach out if symptoms worsen or persist.  Protocols used: Cough - Acute Productive-A-AH

## 2024-04-02 ENCOUNTER — Other Ambulatory Visit: Payer: Self-pay | Admitting: Family Medicine

## 2024-04-02 DIAGNOSIS — L930 Discoid lupus erythematosus: Secondary | ICD-10-CM

## 2024-04-07 ENCOUNTER — Ambulatory Visit: Admitting: Family Medicine

## 2024-04-21 ENCOUNTER — Ambulatory Visit: Admitting: Family Medicine

## 2024-04-21 DIAGNOSIS — L930 Discoid lupus erythematosus: Secondary | ICD-10-CM

## 2024-05-06 ENCOUNTER — Ambulatory Visit: Admitting: Family Medicine

## 2024-09-21 ENCOUNTER — Ambulatory Visit
